# Patient Record
Sex: Female | Born: 1945 | Race: White | Hispanic: No | Marital: Married | Smoking: Never smoker
Health system: Southern US, Community
[De-identification: ages and names within clinical notes are randomized; demographics above are authoritative.]

## PROBLEM LIST (undated history)

## (undated) DIAGNOSIS — E785 Hyperlipidemia, unspecified: Secondary | ICD-10-CM

## (undated) DIAGNOSIS — S065X9A Traumatic subdural hemorrhage with loss of consciousness of unspecified duration, initial encounter: Secondary | ICD-10-CM

## (undated) DIAGNOSIS — I340 Nonrheumatic mitral (valve) insufficiency: Secondary | ICD-10-CM

## (undated) DIAGNOSIS — S065XAA Traumatic subdural hemorrhage with loss of consciousness status unknown, initial encounter: Secondary | ICD-10-CM

## (undated) DIAGNOSIS — T8859XA Other complications of anesthesia, initial encounter: Secondary | ICD-10-CM

## (undated) DIAGNOSIS — I517 Cardiomegaly: Secondary | ICD-10-CM

## (undated) DIAGNOSIS — I4819 Other persistent atrial fibrillation: Secondary | ICD-10-CM

## (undated) DIAGNOSIS — Z7901 Long term (current) use of anticoagulants: Secondary | ICD-10-CM

## (undated) DIAGNOSIS — T4145XA Adverse effect of unspecified anesthetic, initial encounter: Secondary | ICD-10-CM

## (undated) DIAGNOSIS — K219 Gastro-esophageal reflux disease without esophagitis: Secondary | ICD-10-CM

## (undated) DIAGNOSIS — M179 Osteoarthritis of knee, unspecified: Secondary | ICD-10-CM

## (undated) DIAGNOSIS — M1710 Unilateral primary osteoarthritis, unspecified knee: Secondary | ICD-10-CM

## (undated) DIAGNOSIS — I77810 Thoracic aortic ectasia: Secondary | ICD-10-CM

## (undated) DIAGNOSIS — F329 Major depressive disorder, single episode, unspecified: Secondary | ICD-10-CM

## (undated) DIAGNOSIS — G4730 Sleep apnea, unspecified: Secondary | ICD-10-CM

## (undated) DIAGNOSIS — F419 Anxiety disorder, unspecified: Secondary | ICD-10-CM

## (undated) DIAGNOSIS — E669 Obesity, unspecified: Secondary | ICD-10-CM

## (undated) DIAGNOSIS — I10 Essential (primary) hypertension: Secondary | ICD-10-CM

## (undated) DIAGNOSIS — F32A Depression, unspecified: Secondary | ICD-10-CM

## (undated) DIAGNOSIS — I350 Nonrheumatic aortic (valve) stenosis: Secondary | ICD-10-CM

## (undated) HISTORY — PX: KNEE ARTHROSCOPY: SHX127

## (undated) HISTORY — DX: Other persistent atrial fibrillation: I48.19

## (undated) HISTORY — PX: TUBAL LIGATION: SHX77

## (undated) HISTORY — DX: Hyperlipidemia, unspecified: E78.5

## (undated) HISTORY — DX: Long term (current) use of anticoagulants: Z79.01

## (undated) HISTORY — PX: KNEE SURGERY: SHX244

## (undated) HISTORY — DX: Cardiomegaly: I51.7

## (undated) HISTORY — DX: Nonrheumatic mitral (valve) insufficiency: I34.0

## (undated) HISTORY — DX: Thoracic aortic ectasia: I77.810

---

## 1999-11-08 ENCOUNTER — Encounter: Admission: RE | Admit: 1999-11-08 | Discharge: 1999-11-08 | Payer: Self-pay | Admitting: Obstetrics and Gynecology

## 1999-11-08 ENCOUNTER — Encounter: Payer: Self-pay | Admitting: Obstetrics and Gynecology

## 2000-11-11 ENCOUNTER — Encounter: Admission: RE | Admit: 2000-11-11 | Discharge: 2000-11-11 | Payer: Self-pay | Admitting: Obstetrics and Gynecology

## 2000-11-11 ENCOUNTER — Encounter: Payer: Self-pay | Admitting: Obstetrics and Gynecology

## 2002-05-13 ENCOUNTER — Encounter: Payer: Self-pay | Admitting: Obstetrics and Gynecology

## 2002-05-13 ENCOUNTER — Encounter: Admission: RE | Admit: 2002-05-13 | Discharge: 2002-05-13 | Payer: Self-pay | Admitting: Obstetrics and Gynecology

## 2003-06-02 ENCOUNTER — Encounter: Admission: RE | Admit: 2003-06-02 | Discharge: 2003-06-02 | Payer: Self-pay | Admitting: Obstetrics and Gynecology

## 2004-03-09 ENCOUNTER — Ambulatory Visit (HOSPITAL_COMMUNITY): Admission: RE | Admit: 2004-03-09 | Discharge: 2004-03-09 | Payer: Self-pay | Admitting: Cardiology

## 2004-04-09 HISTORY — PX: CARDIAC VALVE REPLACEMENT: SHX585

## 2004-04-09 HISTORY — PX: BURR HOLE FOR SUBDURAL HEMATOMA: SHX1275

## 2004-05-12 ENCOUNTER — Encounter: Admission: RE | Admit: 2004-05-12 | Discharge: 2004-05-12 | Payer: Self-pay | Admitting: Cardiothoracic Surgery

## 2004-05-25 ENCOUNTER — Inpatient Hospital Stay (HOSPITAL_COMMUNITY): Admission: RE | Admit: 2004-05-25 | Discharge: 2004-06-26 | Payer: Self-pay | Admitting: Cardiothoracic Surgery

## 2004-05-25 ENCOUNTER — Encounter (INDEPENDENT_AMBULATORY_CARE_PROVIDER_SITE_OTHER): Payer: Self-pay | Admitting: *Deleted

## 2004-05-25 ENCOUNTER — Ambulatory Visit: Payer: Self-pay | Admitting: Internal Medicine

## 2004-05-26 ENCOUNTER — Encounter (INDEPENDENT_AMBULATORY_CARE_PROVIDER_SITE_OTHER): Payer: Self-pay | Admitting: Cardiology

## 2004-06-07 ENCOUNTER — Ambulatory Visit: Payer: Self-pay | Admitting: Pulmonary Disease

## 2004-07-28 ENCOUNTER — Encounter: Admission: RE | Admit: 2004-07-28 | Discharge: 2004-07-28 | Payer: Self-pay | Admitting: Obstetrics and Gynecology

## 2004-08-10 ENCOUNTER — Inpatient Hospital Stay (HOSPITAL_COMMUNITY): Admission: EM | Admit: 2004-08-10 | Discharge: 2004-08-31 | Payer: Self-pay | Admitting: Emergency Medicine

## 2004-09-11 ENCOUNTER — Encounter: Admission: RE | Admit: 2004-09-11 | Discharge: 2004-09-11 | Payer: Self-pay | Admitting: Neurological Surgery

## 2004-09-18 ENCOUNTER — Emergency Department (HOSPITAL_COMMUNITY): Admission: EM | Admit: 2004-09-18 | Discharge: 2004-09-18 | Payer: Self-pay | Admitting: Emergency Medicine

## 2004-09-28 ENCOUNTER — Encounter: Admission: RE | Admit: 2004-09-28 | Discharge: 2004-09-28 | Payer: Self-pay | Admitting: Neurological Surgery

## 2004-10-09 ENCOUNTER — Encounter (HOSPITAL_COMMUNITY): Admission: RE | Admit: 2004-10-09 | Discharge: 2005-01-07 | Payer: Self-pay | Admitting: Cardiology

## 2005-01-08 ENCOUNTER — Encounter (HOSPITAL_COMMUNITY): Admission: RE | Admit: 2005-01-08 | Discharge: 2005-03-08 | Payer: Self-pay | Admitting: Cardiology

## 2005-10-19 ENCOUNTER — Encounter: Admission: RE | Admit: 2005-10-19 | Discharge: 2005-10-19 | Payer: Self-pay | Admitting: Obstetrics and Gynecology

## 2005-12-06 ENCOUNTER — Encounter: Admission: RE | Admit: 2005-12-06 | Discharge: 2005-12-06 | Payer: Self-pay | Admitting: Gastroenterology

## 2006-11-26 ENCOUNTER — Encounter: Admission: RE | Admit: 2006-11-26 | Discharge: 2006-11-26 | Payer: Self-pay | Admitting: Obstetrics and Gynecology

## 2006-12-26 ENCOUNTER — Ambulatory Visit (HOSPITAL_COMMUNITY): Admission: RE | Admit: 2006-12-26 | Discharge: 2006-12-26 | Payer: Self-pay | Admitting: Neurological Surgery

## 2006-12-26 ENCOUNTER — Ambulatory Visit: Payer: Self-pay | Admitting: Surgery

## 2006-12-26 ENCOUNTER — Encounter (INDEPENDENT_AMBULATORY_CARE_PROVIDER_SITE_OTHER): Payer: Self-pay | Admitting: Neurological Surgery

## 2007-11-20 ENCOUNTER — Emergency Department (HOSPITAL_COMMUNITY): Admission: EM | Admit: 2007-11-20 | Discharge: 2007-11-20 | Payer: Self-pay | Admitting: Emergency Medicine

## 2008-02-23 ENCOUNTER — Encounter: Admission: RE | Admit: 2008-02-23 | Discharge: 2008-02-23 | Payer: Self-pay | Admitting: Obstetrics and Gynecology

## 2009-03-22 ENCOUNTER — Encounter: Admission: RE | Admit: 2009-03-22 | Discharge: 2009-03-22 | Payer: Self-pay | Admitting: Obstetrics and Gynecology

## 2010-04-30 ENCOUNTER — Encounter: Payer: Self-pay | Admitting: Obstetrics and Gynecology

## 2010-05-23 ENCOUNTER — Other Ambulatory Visit: Payer: Self-pay | Admitting: Family Medicine

## 2010-05-23 DIAGNOSIS — Z1231 Encounter for screening mammogram for malignant neoplasm of breast: Secondary | ICD-10-CM

## 2010-06-01 ENCOUNTER — Ambulatory Visit
Admission: RE | Admit: 2010-06-01 | Discharge: 2010-06-01 | Disposition: A | Discharge status: Home / Self Care | Payer: Private Health Insurance - Indemnity | Source: Ambulatory Visit | Attending: Family Medicine | Admitting: Family Medicine

## 2010-06-01 DIAGNOSIS — Z1231 Encounter for screening mammogram for malignant neoplasm of breast: Secondary | ICD-10-CM

## 2010-08-25 NOTE — Op Note (Signed)
NAMEBRILEE, PORT NO.:  [id number]   MEDICAL RECORD NO.:  [id number]          PATIENT TYPE:  INP   LOCATION:  2316                         FACILITY:  [facility]   PHYSICIAN:  Kathlee Nations Trigt III, M.D.DATE OF BIRTH:  [birth year]   DATE OF PROCEDURE:  05/25/2004  DATE OF DISCHARGE:                                 OPERATIVE REPORT   OPERATION:  Aortic valve replacement (aortic valve replacement with 21 mm  St. Jude Reagent valve).   PREOPERATIVE DIAGNOSIS:  Severe aortic stenosis with class III congestive  heart failure.   POSTOPERATIVE DIAGNOSIS:  Severe aortic stenosis with class III congestive  heart failure.   SURGEON:  Kerin Perna, M.D.   ASSISTANT:  Evelene Croon, M.D.   ANESTHESIA:  General by Dr. Adonis Huguenin.   INDICATIONS:  The patient is a 65 year old female with bicuspid aortic valve  and progressive aortic stenosis on serial left 2-D echo exams.  A  significant aortic stenosis of valve area of 0.8 was documented by left and  right heart catheterization by Dr. Armanda Magic.  This also demonstrated  preserved systolic LV function and normal coronary arteries.  She was felt  to be a candidate for aortic valve replacement.  I examined the patient in  the office and reviewed the results of her catheterization and 2-D echoes  was with the patient and her family.  I discussed the indications and  expected benefits of aortic valve replacement for treatment of her aortic  valvular stenosis.  I reviewed the alternatives to surgical therapy as well  and the expected outcome of those alternative therapies.  I discussed with  the patient the plan to use a mechanical aortic prosthesis per her desire to  avoid a redo valve replacement in the future.  She understood that a  mechanical valve prosthesis would require a lifelong commitment Coumadin  anticoagulation, which she accepted.  I also reviewed with the patient the  major aspects of the planned procedure,  including the location of the  surgical incision, use of general anesthesia and cardiopulmonary bypass, and  the expected postoperative hospital recovery period.  I discussed with the  patient the risks to her of aortic valve replacement, including the risks of  MI, CVA, bleeding, infection, and death.  She understood these implications  for the surgery and agreed to proceed with the operation as planned under  what I felt was an informed consent after meeting with her on two separate  occasions in the office.   OPERATIVE FINDINGS:  The patient had bicuspid aortic valve disease.  There  was a significant thickening fibrosis and calcification of the aortic valve.  The TEE post bypass showed the prosthetic valve be functioning well, without  insufficiency and with good LV function.   PROCEDURE:  The patient was brought to operating room and supine on the  operating table, where general anesthesia was induced under invasive  hemodynamic monitoring.  A transesophageal 2-D echo probe was placed by the  anesthesiologist, and the echo exam confirmed the preoperative diagnosis of  severe aortic stenosis.  The chest, abdomen and legs were  prepped with  Betadine and draped as a sterile field.  A sternal incision was made.  The  pericardium was opened and suspended.  A sternal retractor was placed.  Heparin was administered.  Pursestrings were placed in the ascending aorta  and right atrium and the patient was cannulated and placed on bypass after  the ACT was documented as being therapeutic.  Cardioplegia catheters were  placed for both antegrade aortic and retrograde coronary sinus cardioplegia.  An LV vent was placed via the right superior pulmonary vein.  The patient  was cooled to 30 degrees.  The aortic crossclamp was applied and 800 mL of  cold blood cardioplegia was delivered in split doses between the antegrade  aortic and retrograde coronary sinus catheters.  There was good cardioplegic   arrest and septal temperature dropped to less than 10 degrees.  Topical iced  saline was used to augment myocardial preservation and a pericardial  insulator pad was used protect the left phrenic nerve.   A transverse aortotomy was made.  The edge of the aortic tissue was  retracted with a 3-0 silk suture.  The aortic valve inspected and found to be bicuspid, heavily thickened,  calcified and stenotic.  The valve was excised.  The annulus was debrided of  all calcium.  The LV outflow tract was irrigated with copious amounts of  cold saline.  The annulus was sized to a 21 mm St. Jude Reagent valve.  Subannular 2-0 pledgeted Ethibond sutures were placed around the annulus,  numbering 17 mattress sutures in total.  These were then placed through the  sewing ring of the 21 mm Reagent valve, which was then seated, and sutures  were tied.  The valve was opened and closed and found to be well-positioned  without impingement.  There was no obstruction of the coronary ostia and no  evidence of space between each suture.  The outflow tract was again  irrigated and the aortotomy was then closed as the patient was being  rewarmed.  A 4-0 Prolene was used to close the aortotomy in two layers, of  the first layer a running horizontal mattress and the second layer a running  over-and-over suture.  Air was vented from the coronaries and left side of  the heart prior to tying off the aortotomy with a dose of retrograde warm  blood cardioplegia and the usual de-airing maneuvers.  After the air was  purged from the left side of the heart, the aortotomy was closed, the suture  tied, and the crossclamp was removed.   The heart was cardioverted back to a regular rhythm.  The retrograde  cardioplegia catheter was removed.  The LV vent was removed.  The aortic  vent remained to a low level of suction.  Temporary pacing wires were applied.  The patient was rewarmed to 36.5.  The lungs were re-expanded and  the  ventilator was resumed.  The heart was then filled with volume and the  patient separated from bypass without difficulty.  She was in a sinus  rhythm.  The TEE showed the valve to be functioning well without evidence of  intracardiac air.  The cannulas were removed and the protamine was  administered without adverse reaction.  The heart continued to function in a  stable manner.  Hemostasis was documented at the aortotomy and the  cannulation sites.  The mediastinum was irrigated warm saline.  The superior pericardial fat was closed over the aorta.  Two mediastinal  chest  tubes were placed and brought through separate incisions.  The sternum  was closed with interrupted steel wire.  The pectoralis fascia was closed  with a running #1 Vicryl.  The subcutaneous layer and skin were closed using  a running Vicryl and sterile dressings were applied.  Total bypass time was  110 minutes with crossclamp time of 70 minutes.      PV/MEDQ  D:  05/25/2004  T:  05/25/2004  Job:  [id number]   cc:   Armanda Magic, M.D.  [address], [address]  [place], [place] [address]  Fax: [phone]

## 2010-08-25 NOTE — Discharge Summary (Signed)
NAMESTALEY, BUDZINSKI NO.:  [id number]   MEDICAL RECORD NO.:  [id number]          PATIENT TYPE:  INP   LOCATION:  3002                         FACILITY:  [facility]   PHYSICIAN:  Tia Alert, MD     DATE OF BIRTH:  Nov 15, 1945   DATE OF ADMISSION:  08/10/2004  DATE OF DISCHARGE:  08/31/2004                                 DISCHARGE SUMMARY   ADMITTING DIAGNOSIS:  Left acute subdural hematoma.   PROCEDURE:  Twist drill craniostomy for evacuation of left subdural  hematoma.   BRIEF HISTORY OF PRESENT ILLNESS:  [name] is a 65 year old white  female who was on Coumadin for a St. Jude aortic valve who presented with  acute onset of left-sided headache. She had a CT scan in the emergency  department which showed a small left acute subdural hematoma, not requiring  surgical evacuation. She was admitted to the neurosurgical ICU where she was  watched with serial CT scans and frequent neurologic examinations. She had  no right sided weakness. She did have one episode of decreased level of  consciousness in which it was felt she may have had a seizure. She was  started on Dilantin and had no more episodes suggestive of seizure activity.  She continued to do well, and she showed resolution of her acute subdural  hematoma on serial CT scans. She had a MRI and a MRA done to rule out  aneurysm as the cause of her left acute subdural hematoma, and this showed  no evidence of aneurysm. Again, she had a nonfocal neurologic exam. Dr. Donata Clay and Dr. Mayford Knife from cardiovascular surgery and cardiology respectively  followed the patient along. This was quite a difficult situation given her  need for anticoagulation for her mechanical valve, and yet she had a  complication from that anticoagulation. They recommended starting back  anticoagulation to achieve an INR of 1.5, so we would be much less  aggressive with the anticoagulation. Restarted on aspirin on Aug 14, 2004  with  plans to start her back on low-dose heparin several days later. She was  started back on heparin on Aug 16, 2004 to keep her PTT 40 to 60. Follow-up  CT scans showed no evidence of further bleeding. She was started on Coumadin  2.5 mg per day on Aug 17, 2004 with goal INR of 1.5. She was transferred to  the floor and was doing quite well on Aug 19, 2004. Her INR remained 1.0.  Follow-up head CT then showed development of a left-sided chronic subdural  hematoma which on serial scans showed increased growth. Therefore, we  stopped her heparin and held her Coumadin. We checked her PT/INR; her INR  was 1.0. She complained of worsening headaches on Aug 22, 2004 and Aug 23, 2004. When the CT scan of May 17 showed increasing size of the left chronic  subdural hematoma, again I recommended stopping the Coumadin, and I  recommended surgical evacuation. Therefore, we took her to the operating  room on Aug 24, 2004 and performed a left twist drill craniostomy with  placement of left subdural  drain. She did quite well with this and was taken  to the ICU. For details of the operative procedure, please see the dictated  operative note. The subdural drain remained in place for two days and had  minimal output. Follow-up scans showed improvement in the size of the  chronic subdural hematoma. She had a nonfocal neurological exam. Her diet  was advanced to regular diet. Her headache was much improved. In fact, she  had no more headache the remainder of the hospital stay. She was then  transferred to the floor. Once again on Aug 28, 2004, we restarted Coumadin  at 2.5 mg per day, again with a goal INR of 1.5 per cardiovascular  recommendations. Followup head CT showed decreasing size of the residual  extra-axial fluid, and she was discharged to home on Aug 31, 2004 in stable  condition.   DISCHARGE MEDICATIONS:  1.  Coumadin 2.5 mg p.o. q.d. Dr. Mayford Knife to follow INR.  2.  Dilantin 300 mg p.o. q.h.s. We  will continue this for approximately 3      months.  3.  Percocet 1 to 2 p.o. q.8h. p.r.n. pain.  4.  She was to continue her Norvasc, Avapro, Zoloft as she had been on at      home.   She was asked to call for any unusual redness, tenderness, or swelling of  wound or any temperature over 101.5. She is to call for any increasing  headache or numbness, tingling, weakness or seizure activity. Return  appointment is in about 10 days with me with a followup head CT.      DSJ/MEDQ  D:  08/31/2004  T:  08/31/2004  Job:  [id number]

## 2010-08-25 NOTE — Op Note (Signed)
[name], [name] NO.:  [id number]   MEDICAL RECORD NO.:  [id number]          PATIENT TYPE:  INP   LOCATION:  3107                         FACILITY:  [facility]   PHYSICIAN:  Tia Alert, MD     DATE OF BIRTH:  [birth year]   DATE OF PROCEDURE:  08/24/2004  DATE OF DISCHARGE:                                 OPERATIVE REPORT   PREOPERATIVE DIAGNOSIS:  Left chronic subdural hematoma.   POSTOPERATIVE DIAGNOSIS:  Left chronic subdural hematoma.   OPERATION PERFORMED:  Left twist drill craniotomy with placement of left  subdural drain for evacuation of left chronic subdural hematomas.   SURGEON:  Tia Alert, MD   ANESTHESIA:  Local and MAC.   COMPLICATIONS:  None apparent.   INDICATIONS FOR PROCEDURE:  Ms. Bale is a 65 year old white female who  is on Coumadin for a St. Jude aortic valve who presented with a left acute  subdural hematoma not requiring surgical evacuation.  She was doing quite  well in the hospital and had what looked like resolution of her acute  subdural hematoma on serial CT scan.  She was started on a heparin drip to  help avoid cerebrovascular accident  and clotting of her aortic valve and  then started back on Coumadin.  She then started to develop significant  headaches and a follow-up CT scan showed a left chronic subdural hematoma.  Her Coumadin was stopped.  Her INR was normal.  Her heparin was stopped six  hours before surgery and she was consented for left twist drill craniotomy.  Choices included left twist drill craniotomy with subdural drain placement  without general anesthesia versus bur holes or formal craniotomy for  evacuation of the lesion.  We felt because it was a new subdural hematoma  and hadn't had time to form significant membranes and because of her good  neurologic status and her previous problems with general anesthesia, she was  best treated with a more minimal twist drill craniotomy and subdural drain  placement.  Therefore consent was obtained and the patient wished to  proceed.   DESCRIPTION OF PROCEDURE:  The patient was taken to the operating room.  She  was placed in supine position with her head elevated and turned to the  right.  She had a small area that was shaved, prepped with DuraPrep and then  draped in the usual sterile fashion in the left frontal temporal region.  6  cc of local anesthesia were injected and an incision was made in this area  and a self-retaining retractor was placed.  Then using the air-powered  drill, a small bur hole was created.  The dura was then opened with a 15  blade scalpel and the ends of this were coagulated with bipolar cautery with  good flow of significant chronic subdural hematoma fluid (motor oil  appearing).  This seemed to  be under some pressure.  This was irrigated  with a small irrigator and then a subdural drain was placed without the use  of a stylette and this showed good flow of subdural fluid through it. We  then lined the dura with Gelfoam  and then closed the galea with interrupted  2-0 Vicryl after meticulous hemostasis was  achieved.  I then closed the skin with a running 4-0 Ethilon suture.  Sterile dressing was applied.  The subdural drain was hooked to its distal  reservoir.  The patient was then transferred to the recovery room in stable  condition.  At the end of the procedure, all sponge, needle and instrument  counts were correct.       ___________________________________________  Tia Alert, MD    DSJ/MEDQ  D:  08/24/2004  T:  08/25/2004  Job:  [id number]

## 2010-08-25 NOTE — Op Note (Signed)
[name], [name]           ACCOUNT NO.:  [id number]   MEDICAL RECORD NO.:  [id number]          PATIENT TYPE:  INP   LOCATION:  2316                         FACILITY:  [facility]   PHYSICIAN:  Quita Skye. Krista Blue, M.D.  DATE OF BIRTH:  12-29-1945   DATE OF PROCEDURE:  05/25/2004  DATE OF DISCHARGE:                                 OPERATIVE REPORT   PROCEDURE:  Transesophageal echocardiogram.   DIAGNOSIS:  Aortic stenosis.   Ms. [name] is a 65 year old white female who presents to the  operating room for aortic valve replacement.  Dr. Kathlee Nations Trigt requested  transesophageal echocardiogram for interoperative management of the patient.  Following routine cardiac induction, the transesophageal probe was covered  with a latex free sheath, lubricated, and inserted into the patient's  esophagus through a tooth guard.  The overall image of the heart showed no  evidence of pericardial effusion.  The right atrium was normal in size.  The  intra-atrial septum was intact without evidence of defect.  There was no  evidence of masses or thrombus in the right atrium.  The tricuspid valve had  a normal appearing structure with trace regurgitation.  The pulmonary artery  catheter was seen crossing the valve into the right ventricle.  The right  ventricle had good contractility, no evidence of masses or defects of the  interventricular septum.  There was no evidence of segmental wall motion  abnormality of the right ventricle.  The pulmonary valve had trace  regurgitation with pulmonary catheter crossing the valve.  The left  ventricle was normal in size.  The pulmonary vein Doppler showed S and V  wave of 52 and 45 with a normal appearing wave form and no reversal of flow.  The mitral valve had a normal appearing structure with no evidence of  prolapse or flail.  The mitral in flow pattern was normal showing an E and A  wave of 94 and 100 with an estimated max gradient of 3.5 mm.  The  left  ventricle was measured which had good contractility throughout which had  significant left ventricular hypertrophy of about 1.7 cm.  There was good  contractility and no evidence of segmental wall motion abnormalities.  The  aortic valve was evaluated which appeared to be bicuspid effusion of the  left and right coronary cusps.  Valve area was 0.6 cm square.  Doppler  evaluation was difficult because the valve was heavily calcified but  demonstrated a max velocity of about 300 cm per second with a valve area  measuring just under 0.9.  This stenosis was felt to be critical.  The  annulus of the valve measured at 1.8 cm with a sinus size of 3.14.  There  did appear to be some post stenotic dilatation of the proximal aorta  distally.  The descending thoracic aorta was approximately 2.4 cm across  without evidence of significant atherosclerotic disease.  The patient  underwent aortic valve replacement successfully with a St. Jude valve.  There was no evidence of rocking motion or perivalvular leak.  There was  also no evidence of significant stenosis  or any regurgitation, at all.  Peak  flows of this valve were significantly less than 2 meters per second.  The  patient tolerated the separation of bypass and did well post bypass.  The  transesophageal probe was carefully removed and the patient was taken to the  SICU intubated in good condition.  The patient tolerated the procedure well.      JDS/MEDQ  D:  05/25/2004  T:  05/25/2004  Job:  [id number]

## 2010-08-25 NOTE — Op Note (Signed)
[name], GUDGEL NO.:  [id number]   MEDICAL RECORD NO.:  [id number]          PATIENT TYPE:  INP   LOCATION:  2316                         FACILITY:  [facility]   PHYSICIAN:  Suzanna Obey, M.D.       DATE OF BIRTH:  [birth year]   DATE OF PROCEDURE:  06/06/2004  DATE OF DISCHARGE:                                 OPERATIVE REPORT   PREOPERATIVE DIAGNOSIS:  Respiratory distress and failure.   POSTOPERATIVE DIAGNOSIS:  Respiratory distress and failure.   PROCEDURE:  Tracheotomy.   ANESTHESIA:  General endotracheal tube anesthesia.   ESTIMATED BLOOD LOSS:  Less than 5 mL.   INDICATIONS FOR PROCEDURE:  This is a 65 year old who has had surgery for  open heart and she has now failed extubated two times. She does have sleep  apnea.  Patient has felt like her hypoventilation syndrome is contributing  to her failure to extubate.  She is now ready for a tracheotomy.  I  discussed this with the husband and he is informed of the risks and benefits  of the procedure including bleeding, infection, scarring of the trachea and  larynx, pneumothorax, scar and risk of the anesthetic.  All questions were  answered and consent was obtained.   DESCRIPTION OF PROCEDURE:  Patient taken to the operating room and placed in  the supine position.  After adequate general endotracheal tube anesthesia,  was prepped and draped in the usual sterile manner.  A midline incision  vertically was made with the electrocautery.  Dissection was carried down to  the strap muscles where the midline of the strap muscles was divided and  immediately encountered the thyroid isthmus.  This was divided with the  electrocautery and the trachea was then easily visualized.  The second and  third tracheal ring was right in the view of this dissection.  The cricoid  could be palpated superiorly.  An inferior based flap was then created with  electrocautery, not going through the cartilage and then a 3-0 chromic  was  placed through this inferior based flap.  The trachea was then opened with a  hemostat and Metzenbaum scissors were used to cut the trachea to create the  flap.  This was sutured up with a chromic.  The endotracheal tube was then  removed and suctioned out of all blood and debris and the #6 Shiley was  placed.  The skin to trachea was measured and it was approximately 3 cm.  A  6 seemed more than adequate in length.  It was placed without any  difficulty.  It was secured with a 3-0 silk and trach ties.  The patient was  then awakened and brought to recovery in stable condition.  Counts correct.      JB/MEDQ  D:  06/06/2004  T:  06/06/2004  Job:  [id number]

## 2010-08-25 NOTE — Cardiovascular Report (Signed)
[name], [name]           ACCOUNT NO.:  [id number]   MEDICAL RECORD NO.:  [id number]          PATIENT TYPE:  OIB   LOCATION:  NA                           FACILITY:  [facility]   PHYSICIAN:  Armanda Magic, M.D.     DATE OF BIRTH:  [birth year]   DATE OF PROCEDURE:  03/09/2004  DATE OF DISCHARGE:                              CARDIAC CATHETERIZATION   REFERRING PHYSICIAN:  Carola J. Gerri Spore, M.D. and S. Kyra Manges, M.D.   PROCEDURE:  1.  Right and left heart catheterization.  2.  Coronary angiography.  3.  Left ventriculography.   OPERATOR:  Armanda Magic, M.D.   INDICATIONS:  Aortic stenosis.   COMPLICATIONS:  None.   IV ACCESS:  Via right femoral artery 6-French sheath and right femoral vein  8-French sheath.   This is a 65 year old white female with a history of aortic stenosis.  Most  recent 2-D echocardiogram showed moderate aortic stenosis with an aortic  valve area of approximately 0.9-1 sq cm and also increased left ventricular  hypertrophy.  She is now starting to develop some shortness of breath and  now presents for cardiac catheterization.   The patient was brought to the cardiac catheterization laboratory in a  fasting, nonsedated state.  Informed consent was obtained.  The patient was  connected to continuous heart rate and pulse oximetry monitoring,  intermittent blood pressure monitoring.  The right groin was prepped and  draped in a sterile fashion.  1% Xylocaine was used for local anesthesia.  Using a modified Seldinger technique a 6-French sheath was placed in the  right femoral artery.  Using a modified Seldinger technique an 8-French  sheath was placed in the right femoral vein.  Under fluoroscopic guidance, a  Swan-Ganz catheter was placed under balloon flotation into the right atrium.  Right atrial pressure and O2 saturations were measured.  The catheter was  then advanced into the right ventricle and right ventricular pressure was  measured.  The  catheter was then advanced into the pulmonary artery and  pulmonary pressure and saturations were measured.  The catheter was then  allowed to fall into the wedge position and pressure was measured.  The  balloon was then deflated and pulled back into the pulmonary artery.  Cardiac output was measured using 10 mL of saline on five separate  injections and the two highest and lowest values were omitted.  The catheter  was then removed.  Under fluoroscopic guidance a JL4 catheter was placed in  the left coronary artery.  Multiple cineangiographic films were taken in a  30 degree RAO, 40 degree LAO views.  This catheter was then exchanged out  over a guidewire for a 6-French JR4 catheter which was unsuccessful in  engaging the right coronary ostium.  The catheter was exchanged out for a no-  toque coronary catheter which again was unsuccessful in cannulating the  right coronary artery.  The catheter was then exchanged out for a 6-French  AL1 catheter which successfully cannulated the right coronary artery and  cineangiographic films were taken in the 40 degree LAO view.  The catheter  was  then exchanged out over a guidewire for a 6-French angled pigtail  catheter which was placed in the vicinity of the aortic valve, but was  unable to cross the valve.  The catheter was exchanged out over a guidewire  with a 6-French JR4 catheter which was placed up into the area of the aortic  valve.  The valve was probed initially using a J-wire and this wire was  exchanged out for a long exchange straight wire.  The valve was again probed  without success.  My colleague, Dr. Amil Amen, stepped in and placed an AL1  catheter up in the proximity of the aortic valve and used a straight  catheter to cross over the aortic valve.  An Amplatz catheter was then  exchanged out over a guidewire for a 6-French angled pigtail catheter which  was placed over the wire into the left ventricular cavity.  Left   ventriculography was performed using a 30 degree RAO view using a total of  30 mL of contrast at 15 mL/second.  The catheter was then pulled back across  the aortic valve and then subsequently the catheter and sheaths were  removed.  Manual compression was performed until adequate hemostasis was  obtained.  The patient was transferred back to room in stable condition.   RESULTS:  Right heart catheterization data:  The right atrial pressure was  12/12 with a mean of 11 mmHg.  RV pressure 38/12 with a mean of 15 mmHg.  PA  pressure 39/23 with a mean of 33 mmHg.  Pulmonary capillary wedge 18/17 mmHg  with a mean of 15 mmHg.  Aortic pressure 113/72 mmHg.  LV pressure 150/12  mmHg.  LVEDP 24 mmHg.  O2 saturations are A 66%, PA 67%, AO 91%.  Cardiac  output by Fick 4.7, by thermodilution 2.2.  Cardiac index by Fick 6.3 and  thermodilution 3.  Aortic valve peak gradient was 30 mmHg, mean gradient 25  mmHg.  Aortic valve area calculated by the Fick is 0.93 sq cm consistent  with moderate aortic stenosis.   Left ventriculography shows low normal LV systolic function, EF 50-55% with  heavily calcified aortic valve.   The left main coronary artery is widely patent.  It bifurcates into left  anterior descending artery and left circumflex artery.  The left anterior  descending artery is widely patent throughout its course to the apex and  gives rise to one diagonal branch which is widely patent.  Left circumflex  is widely patent throughout its course and gives rise to a first obtuse  marginal branch which is widely patent.  It then gives rise to a second  obtuse marginal branch which is widely patent and terminates in a posterior  descending artery which is widely patent.   The right coronary artery is small and nondominant, but patent.   ASSESSMENT:  1.  Normal coronary arteries.  2.  Moderate aortic stenosis in a patient who is symptomatic with shortness     of breath.  3.  Mild pulmonary  hypertension.  4.  Low normal left ventricular systolic function with elevated LVEDP.   PLAN:  Discharge to home after IV fluid and bed rest.  CVTS consult for  aortic valve replacement.      Trac   TT/MEDQ  D:  03/09/2004  T:  03/09/2004  Job:  [id number]   cc:   Mikey Bussing, M.D.  [address]  [place]  [place] [address]

## 2010-08-25 NOTE — Consult Note (Signed)
NAMELARRA, CRUNKLETON NO.:  [id number]   MEDICAL RECORD NO.:  [id number]          PATIENT TYPE:  INP   LOCATION:  2316                         FACILITY:  [facility]   PHYSICIAN:  Shan Levans, M.D. LHCDATE OF BIRTH:  1945/09/18   DATE OF CONSULTATION:  DATE OF DISCHARGE:                                   CONSULTATION   CHIEF COMPLAINT:  Respiratory failure, status post AVR.   HISTORY OF PRESENT ILLNESS:  This is a 65 year old white female who was  admitted on May 25, 2004 for elective aortic valve replacement.  Previously found to have aortic stenosis.  Did not have bypass surgery.  Does have preexisting obesity, depression, beta blocker intolerance, and  hypertension.  She was found to have a cardiac murmur for three years and  was followed by serial echoes and found to have progressive valvular  gradient, and then was referred electively for the aortic valve replacement.  Does have an underlying obesity.   Preop medicines that include Zoloft, HCTZ, Benicar, Norvasc, aspirin,  vitamins, calcium.   Patient is a nonsmoker with no preexisting lung disease other than as it  relates to the obesity.   PAST MEDICAL HISTORY:  Medical as noted above.  Medications as noted above.   ALLERGIES:  Intolerant to CODEINE because of tremor.  Cannot tolerate BETA  BLOCKERS.   SOCIAL HISTORY:  Works for [employer].  Does not smoke.  Married.  Husband will be caretaker following surgery.   FAMILY HISTORY:  Positive for mitral valve prolapse in both parents.  Grandfather had bypass surgery.  Lived to age 46.   REVIEW OF SYSTEMS:  Weight has been in the 220 range and has been stable.  She has not been found to have obstructive sleep apnea in the past, but has  not had specific testing for this.  No active chronic lung disease.  No  smoking is noted.  Review of systems otherwise noncontributory.   PHYSICAL EXAMINATION:  This is an obese white female, recently  reintubated  in no acute distress.  Temperature 101.3, blood pressure 110/55, pulse 125  to 100 range, atrial fibrillation.  PA pressure 35/24. Cardiac index 2.65.  Saturation 100%.  Earlier prior to intubation:  pH 7.21, PCO2 59, PO2 62 on  the 6 liter cannula.  Now on tidal volume of 750, PEEP of 10, FIO2 of 90%,  rate of 16, pH 750, PCO2 29, PO2 115.  And now the PEEP is down to 8 and  FIO2 down to 0.5 and tidal volume down to 700, rate down to 14, assist  control mode.  Peak pressure 34, plateau pressure 28.  CHEST:  Showed expired wheezes with poor air movement, rales at the bases.  CARDIAC:  Resting tachycardia, irregular irregular without S3.  Normal S1,  S2.  Artificial valve closure sound noted.  EXTREMITIES:  No edema or clubbing.  Good perfusion.  Good capillary refill.  ABDOMEN:  Protuberant.  Bowel sounds active.  NEUROLOGIC:  Patient is awake and alert.  Will follow simple commands.  Is  on __________ drip.  Is very calm.   LABORATORY  DATA:  Hemoglobin 9.9, white count 9.7, platelet count 192.  Sodium 140, potassium 3.6, chloride 106, CO2 22, BUN 12, creatinine 1.0,  magnesium 2.1, blood sugar 183, calcium 9.7.   The patient maintains the __________ drip, the low dose dopamine, is on  amiodarone.  Is off neo.  Is on low dose Levophed.  Also on a Lasix drip.  Chest x-ray is obtained and reviewed and shows atelectasis, mild edema is  noted.  No evidence of acute lung injury is yet discerned.  Low lung volumes  is noted.   IMPRESSION:  Aortic stenosis status post aortic valve replacement on  May 25, 2004 with postop respiratory failure with hypoxemia and  hypercarbia, low lung volumes, atelectasis and fever, possible early acute  lung injury.  No preexisting lung disease, but likely has element of obesity  hypoventilation syndrome, obstructive sleep apnea with elevated PCO2 preop.  Has beta blocker intolerances, history of hypertension, now has atrial  fibrillation,  pulmonary edema and shock postop with vasopressor dependence.  Cardiac index is adequate.  PADs are adequate.   RECOMMENDATIONS:  Agree with diuresis as you are doing and vasopressor  support as you are applying.  Keep ventilator on through the weekend until  the lungs are improved and patient is out of shock state.  Will likely need  to extubate to the bilevel support or CPAP as a bridge, then later use CPAP  h.s.  Agree with expanding antibiotics coverage to cefepime and vancomycin.  Will obtain a BAL of right lower lobe.  Agree with blood cultures and urine  cultures.  Will start neb treatments.  Will adjust ventilator accordingly  and also agree with the triple lumen PICC catheter.  Will follow with you.      PW/MEDQ  D:  05/26/2004  T:  05/26/2004  Job:  [id number]   cc:   Mikey Bussing, M.D.  [address]  [place]  [place] [address]   Otilio Connors. Gerri Spore, M.D.  [address]  [place]  [place] [address]  Fax: [phone]   Quita Skye, MD

## 2010-08-25 NOTE — Consult Note (Signed)
[name], MENTZEL NO.:  [id number]   MEDICAL RECORD NO.:  [id number]          PATIENT TYPE:  INP   LOCATION:  3110                         FACILITY:  [facility]   PHYSICIAN:  Meade Maw, M.D.    DATE OF BIRTH:  04-25-1945   DATE OF CONSULTATION:  DATE OF DISCHARGE:                                   CONSULTATION   PRIMARY CARDIOLOGIST:  Armanda Magic, M.D.   INDICATION FOR CONSULTATION:  Recent aortic valve replacement, unable to  anticoagulate secondary to subarachnoid bleed.   HISTORY:  Amyjo is a very pleasant 65 year old female with a past medical  history of aortic stenosis. She underwent aortic valve replacement in  February 2006. Her postoperative course was complicated by upper respiratory  tract infection, pneumonia requiring tracheostomy, congestive heart failure,  and perioperative atrial fibrillation. She was initiated on amiodarone. This  was discontinued secondary to elevation in her liver enzymes. The patient  presented with a three to four week history of ongoing daily headache  without focal deficits. On the day of admission she had eaten popcorn,  started with some paroxysmal coughing, and she went to do errands and while  sitting in the car developed a severe headache associated with nausea,  vomiting, and diaphoresis. She was evaluated in the emergency room. CT scan  of the head revealed a subarachnoid bleed with a 3-4 mm midline shift. There  was no focal deficit. She was admitted by neurosurgery. Her INR in the  clinic was noted to be therapeutic. She had only had one slightly elevated  INR. Her INR at the time of presentation was 2.3. In the emergency room she  was given FFP 4 units and vitamin K 10 mg subcu. Currently her  anticoagulation including aspirin is on hold.   REVIEW OF SYSTEMS:  As noted above. She has had increasing fatigue recently.   FAMILY HISTORY:  Noncontributory.   SOCIAL HISTORY:  No history of tobacco, alcohol, or  illicit drug use.   PAST MEDICAL HISTORY:  As noted above significant for hypertension, aortic  valve replacement with St. Jude valve, respiratory failure requiring  tracheostomy, fatigue secondary to beta blockers, and perioperative atrial  fibrillation.   ALLERGIES:  CODEINE.   HOME MEDICATIONS:  1.  Toprol 25 mg daily.  2.  Coumadin adjusted to maintain an INR of 2 to 3.  3.  Benicar 20 mg daily.  4.  Norvasc 10 mg daily.  5.  Zoloft 50 mg daily.  6.  Ambien p.r.n.   CURRENT MEDICATIONS:  Zofran, Demerol, Toprol XL 25 mg daily, and Percocet  p.r.n.   PHYSICAL EXAMINATION:  GENERAL: A middle-age female in no acute distress,  continues to have mild headache.  HEENT: Unremarkable.  VITAL SIGNS: Blood pressure 105/55, heart rate 72. She is afebrile.  PULMONARY: Breath sounds are equal and clear to auscultation. O2 saturation  on room air is 98%.  CARDIOVASCULAR: Regular rate and rhythm. There is a prosthetic valve click.  There is no aortic insufficiency noted.  ABDOMEN: Obese, nontender. No usual bruits or pulsation noted.  EXTREMITIES: No peripheral edema.  SKIN: Warm  and dry.   LABORATORY DATA:  White count 8.8, hematocrit 35, platelet count 336,000.  Potassium 3.6, creatinine 0.9, INR 1.4.   ECG reveals a sinus rhythm. There is a first degree AV block. She is noted  to have nonspecific ST changes. Her QTC is 547 milliseconds. CT scan of the  head reveals a subdural hematoma with a 3 to 4 mm shift of the midline  structure.   IMPRESSION:  1.  Acute subarachnoid bleed secondary to anticoagulation. The patient is      fortunate and has had no focal deficits noted. Risks versus benefits of      initiating Coumadin will have to be considered. The patient was      therapeutic at the time of presentation. She is also at a slightly      higher risk of thrombosis in view of her history of atrial fibrillation      and hypertension. Fortunately the patient has preserved systolic       function.  2.  Hypertension. Blood pressure is well controlled at this point.  3.  Headache. This has significantly improved.      HP/MEDQ  D:  08/11/2004  T:  08/11/2004  Job:  [id number]   cc:   Tia Alert, MD  Fax: [phone]   Armanda Magic, M.D.  [address]  [place], [place] [address]  Fax: [phone]   Otilio Connors. Gerri Spore, M.D.  [address]  [place]  [place] [address]  Fax: [phone]

## 2010-08-25 NOTE — H&P (Signed)
[name], AUSTILL NO.:  [id number]   MEDICAL RECORD NO.:  [id number]          PATIENT TYPE:  INP   LOCATION:  3110                         FACILITY:  [facility]   PHYSICIAN:  Tia Alert, MD     DATE OF BIRTH:  [birth year]   DATE OF ADMISSION:  08/10/2004  DATE OF DISCHARGE:                                HISTORY & PHYSICAL   CHIEF COMPLAINT:  Left subdural hematoma.   HISTORY OF PRESENT ILLNESS:  [name] is a 65 year old white female  with a history of aortic stenosis status post aortic valve replacement with  a St. Jude valve who is on Coumadin who noted the onset of a sudden severe  headache while seated in her car earlier today, it was sudden onset, with no  loss of consciousness, quite severe, no numbness, tingling, weakness or  seizure activity.  She was brought to the emergency department where a head  CT showed a left acute subdural hematoma, her INR at this time was 2.2 and  she was ordered 4 units of FFP and vitamin K and neurosurgical consultation  was requested.  The patient complains of a headache about 7/10.  She denies  any history of trauma or falls.   PAST MEDICAL HISTORY:  1.  Atrial fibrillation.  2.  Hypertension.  3.  Aortic valve replacement.  4.  Knee surgery.  5.  Elbow surgery.  6.  History of paralysis of hemidiaphragm.   MEDICATIONS:  Toprol XL, Zoloft, multivitamin, calcium and Coumadin.   ALLERGIES:  CODEINE.   PHYSICAL EXAMINATION:  VITAL SIGNS:  Blood pressure 165/84, respirations 22,  she is afebrile.  GENERAL:  Very pleasant and cooperative white female who is in some obvious  discomfort lying on a stretcher.  HEENT:  Normocephalic, atraumatic.  Extraocular movements are intact.  Neck  is supple.  HEART:  Heart has an irregular rhythm.  EXTREMITIES:  No clubbing, cyanosis, or edema.  NEUROLOGIC EXAM:  She is awake and alert.  She is oriented x4.  No aphasia.  Good attention span.  Good fund of knowledge.  Good  memory.  Cranial nerves  II-XII are intact.  She has no pronator drift.  She moves everything  equally.  Good grips.  Gait is not tested.   IMAGING STUDIES:  CT scan of the brain shows a small left acute subdural  hematoma which measures much less than 1 cm with about 4 mm of midline  shift.  I do not see any evidence of subarachnoid hemorrhage though it may  be a little more hyperdense in the left sylvian fissure than in the right  but this is a very soft call.   ASSESSMENT AND PLAN:  This is a 65 year old white female who has got an  acute subdural hematoma with no evidence of trauma.  She was on Coumadin  with an INR of 2.2.  She was given fresh frozen plasma and vitamin K.  We  will repeat her PT/INR after the fresh frozen plasma is in.  We will admit  her to the intensive care unit q.1h. neurologic checks.  Because  she does  have a small subdural and no history of trauma, we will perform an MRI/MRA  to rule out a posterior communicating artery  aneurysm especially given the  history of her headache.   This is a very difficult situation because of her aortic mechanical valve.  She now has a contraindication to anticoagulation.  There are really no good  studies to help [place] determine when to resume her anticoagulation with a small  acute subdural hematoma.  We will  follow her with serial CT scans and we  will just put her on aspirin as soon as we feel it is safe and then convert  her to Lovenox or heparin at a low dose before recoumadinizing her.  The  risk of the subdural increasing in size or becoming a chronic subdural and  increasing this __________ needing surgery at a later date was discussed  with the patient.  She demonstrated understanding.      DSJ/MEDQ  D:  08/10/2004  T:  08/10/2004  Job:  [id number]

## 2010-08-25 NOTE — Discharge Summary (Signed)
[name], [name] NO.:  [id number]   MEDICAL RECORD NO.:  [id number]          PATIENT TYPE:  INP   LOCATION:  2038                         FACILITY:  [facility]   PHYSICIAN:  Kerin Perna, M.D.  DATE OF BIRTH:  07-07-1945   DATE OF ADMISSION:  05/25/2004  DATE OF DISCHARGE:  06/26/2004                                 DISCHARGE SUMMARY   HISTORY OF PRESENT ILLNESS:  The patient is a 65 year old overweight white  female who was evaluated by Dr. Donata Clay in mid December of last year for  an aortic valve replacement for recently diagnosed aortic stenosis.  The  patient has a 2-3 year history of cardiac murmur and has been followed by  serial 2-D echocardiograms by Dr. Mayford Knife.  The echo's have showed a  progressive increase in transvalvular gradient and her most recent echo in  late 2005 showed an aortic valve area of 0.9.  Subsequent to this she  underwent cardiac catheterization with right and left heart catheterization  on March 09, 2005.  The coronaries were normal.  Her ejection fraction was  55%.  Her aortic valve area was 0.9 and her PA pressure was 38/24 with a  peak gradient of 34 mmHg.  Due to these findings and increasing symptoms of  dyspnea on exertion and recently noticed ankle edema, the patient was felt  to be a candidate for valve replacement and was admitted this  hospitalization for the procedure.   PAST MEDICAL HISTORY:  1.  Hypertension.  2.  Aortic stenosis.  3.  Depression.  4.  Beta blocker intolerance.   MEDICATIONS PRIOR TO ADMISSION:  1.  Zoloft 50 mg daily.  2.  Hydrochlorothiazide 25 mg daily.  3.  Benicar 20 mg daily.  4.  Norvasc 10 mg daily.  5.  Aspirin 81 mg daily.  6.  Vitamins and calcium.   ALLERGIES:  SHE HAS AN TOLERANCE TO CODEINE.   FAMILY HISTORY/SOCIAL HISTORY/REVIEW OF SYSTEMS/PHYSICAL EXAMINATION:  Please see the history and physical done on admission.   HOSPITAL COURSE:  The patient was admitted electively  and on May 25, 2004 was taken to the operating room where he underwent the aortic valve  replacement with a 21 mm [employer] St. Jude mechanical valve prosthesis.  The  patient tolerated the procedure well and was taken to the intensive care  unit in stable condition.   POSTOPERATIVE HOSPITAL COURSE:  The patient's postoperative course was  complicated primarily due to respiratory failure.  She was initially  extubated but did not tolerate this with a gradually increasing PCO2 with  retention requiring reintubation.  A critical care medicine consultation was  then obtained.  The working diagnoses over the next several weeks included  combinations of multiple factors including obstructive sleep apnea, possible  infectious processes, fluid overload, pituitary and other endocrine apathies  were ruled out.  Aggressive management was undertaken to try to wean her and  one attempt was made with quick failure.  She eventually came to requiring  placement of tracheostomy.  During the postoperative phase she also had  atrial fibrillation requiring multiple  agent regimens for chemical  cardioversion as well as Coumadin anticoagulation therapy.  She did  subsequently convert to a normal sinus rhythm.  After aggressive management  with the tracheostomy she was able to be plugged and tracheostomy was  discontinued.  Nutrition and other respiratory consultations were obtained  and the patient tolerated a gradual increase in mobility with aggressive  interventions.  She was taught a diet management precaution regimen to help  prevent aspiration.  Incision healed well without difficulty with infection.  She was weaned from oxygen maintaining good saturations on room air.  She  showed no evidence of congestive heart failure and on June 26, 2004 she was  deemed to be acceptable for discharge.   DISCHARGE MEDICATIONS:  1.  Zoloft 50 mg daily.  2.  Coumadin 5 mg daily and as directed by her  cardiologist, Dr. Mayford Knife.  3.  Toprol XL 25 mg daily.  4.  Amiodarone 200 mg daily.  5.  For pain Tylox one or two every four hours as needed for pain.   DISCHARGE INSTRUCTIONS:  The patient received written instructions regarding  medications, activity, diet, wound care and follow up. Follow up included  Dr. Donata Clay Friday, July 14, 2004, Dr. Mayford Knife in two weeks post discharge.  INR's  were also to be managed on the schedule for Dr. Norris Cross office.   CONDITION ON DISCHARGE:  Stable, improved.   FINAL DIAGNOSIS:  Aortic stenosis status post mechanical aortic valve  replacement.   OTHER DIAGNOSES:  1.  Postoperative atrial fibrillation.  2.  Postoperative ventilatory dependent respiratory failure, multifactorial.  3.  Congestive heart failure.  4.  Hypertension.  5.  Obesity.  6.  Depression.  7.  Diabetes mellitus type 2.  8.  Postoperative anemia.       WEG/MEDQ  D:  10/25/2004  T:  10/26/2004  Job:  [id number]   cc:   Armanda Magic, M.D.  [address]  [place], [place] [address]  Fax: [phone]   [facility]

## 2010-09-06 ENCOUNTER — Ambulatory Visit
Admission: RE | Admit: 2010-09-06 | Discharge: 2010-09-06 | Disposition: A | Discharge status: Home / Self Care | Payer: Medicare Other | Source: Ambulatory Visit | Attending: Family Medicine | Admitting: Family Medicine

## 2010-09-06 ENCOUNTER — Other Ambulatory Visit: Payer: Self-pay | Admitting: Family Medicine

## 2010-09-06 DIAGNOSIS — R52 Pain, unspecified: Secondary | ICD-10-CM

## 2011-04-12 DIAGNOSIS — Z7901 Long term (current) use of anticoagulants: Secondary | ICD-10-CM | POA: Diagnosis not present

## 2011-04-12 DIAGNOSIS — Z954 Presence of other heart-valve replacement: Secondary | ICD-10-CM | POA: Diagnosis not present

## 2011-04-24 ENCOUNTER — Other Ambulatory Visit: Payer: Self-pay | Admitting: Family Medicine

## 2011-04-24 DIAGNOSIS — Z1231 Encounter for screening mammogram for malignant neoplasm of breast: Secondary | ICD-10-CM

## 2011-05-10 DIAGNOSIS — I4891 Unspecified atrial fibrillation: Secondary | ICD-10-CM | POA: Diagnosis not present

## 2011-05-10 DIAGNOSIS — Z954 Presence of other heart-valve replacement: Secondary | ICD-10-CM | POA: Diagnosis not present

## 2011-05-14 ENCOUNTER — Other Ambulatory Visit: Payer: Self-pay | Admitting: Gastroenterology

## 2011-05-14 DIAGNOSIS — Z1211 Encounter for screening for malignant neoplasm of colon: Secondary | ICD-10-CM

## 2011-05-22 ENCOUNTER — Ambulatory Visit
Admission: RE | Admit: 2011-05-22 | Discharge: 2011-05-22 | Disposition: A | Discharge status: Home / Self Care | Payer: Medicare Other | Source: Ambulatory Visit | Attending: Gastroenterology | Admitting: Gastroenterology

## 2011-05-22 DIAGNOSIS — K5730 Diverticulosis of large intestine without perforation or abscess without bleeding: Secondary | ICD-10-CM | POA: Diagnosis not present

## 2011-05-22 DIAGNOSIS — Z1211 Encounter for screening for malignant neoplasm of colon: Secondary | ICD-10-CM

## 2011-06-05 ENCOUNTER — Ambulatory Visit
Admission: RE | Admit: 2011-06-05 | Discharge: 2011-06-05 | Disposition: A | Discharge status: Home / Self Care | Payer: Medicare Other | Source: Ambulatory Visit | Attending: Family Medicine | Admitting: Family Medicine

## 2011-06-05 DIAGNOSIS — Z1231 Encounter for screening mammogram for malignant neoplasm of breast: Secondary | ICD-10-CM | POA: Diagnosis not present

## 2011-06-07 DIAGNOSIS — Z954 Presence of other heart-valve replacement: Secondary | ICD-10-CM | POA: Diagnosis not present

## 2011-06-07 DIAGNOSIS — Z7901 Long term (current) use of anticoagulants: Secondary | ICD-10-CM | POA: Diagnosis not present

## 2011-06-08 ENCOUNTER — Other Ambulatory Visit: Payer: Self-pay | Admitting: Dermatology

## 2011-06-08 DIAGNOSIS — C44519 Basal cell carcinoma of skin of other part of trunk: Secondary | ICD-10-CM | POA: Diagnosis not present

## 2011-06-08 DIAGNOSIS — L820 Inflamed seborrheic keratosis: Secondary | ICD-10-CM | POA: Diagnosis not present

## 2011-06-08 DIAGNOSIS — D239 Other benign neoplasm of skin, unspecified: Secondary | ICD-10-CM | POA: Diagnosis not present

## 2011-06-14 DIAGNOSIS — Z954 Presence of other heart-valve replacement: Secondary | ICD-10-CM | POA: Diagnosis not present

## 2011-06-14 DIAGNOSIS — Z7901 Long term (current) use of anticoagulants: Secondary | ICD-10-CM | POA: Diagnosis not present

## 2011-06-27 DIAGNOSIS — Z7901 Long term (current) use of anticoagulants: Secondary | ICD-10-CM | POA: Diagnosis not present

## 2011-06-27 DIAGNOSIS — Z954 Presence of other heart-valve replacement: Secondary | ICD-10-CM | POA: Diagnosis not present

## 2011-07-18 DIAGNOSIS — I4891 Unspecified atrial fibrillation: Secondary | ICD-10-CM | POA: Diagnosis not present

## 2011-07-18 DIAGNOSIS — Z7901 Long term (current) use of anticoagulants: Secondary | ICD-10-CM | POA: Diagnosis not present

## 2011-08-01 ENCOUNTER — Other Ambulatory Visit: Payer: Self-pay | Admitting: Dermatology

## 2011-08-01 DIAGNOSIS — C44519 Basal cell carcinoma of skin of other part of trunk: Secondary | ICD-10-CM | POA: Diagnosis not present

## 2011-08-15 DIAGNOSIS — Z954 Presence of other heart-valve replacement: Secondary | ICD-10-CM | POA: Diagnosis not present

## 2011-08-15 DIAGNOSIS — I4891 Unspecified atrial fibrillation: Secondary | ICD-10-CM | POA: Diagnosis not present

## 2011-08-31 DIAGNOSIS — I359 Nonrheumatic aortic valve disorder, unspecified: Secondary | ICD-10-CM | POA: Diagnosis not present

## 2011-08-31 DIAGNOSIS — I4891 Unspecified atrial fibrillation: Secondary | ICD-10-CM | POA: Diagnosis not present

## 2011-08-31 DIAGNOSIS — Z7901 Long term (current) use of anticoagulants: Secondary | ICD-10-CM | POA: Diagnosis not present

## 2011-08-31 DIAGNOSIS — I119 Hypertensive heart disease without heart failure: Secondary | ICD-10-CM | POA: Diagnosis not present

## 2011-08-31 DIAGNOSIS — Z954 Presence of other heart-valve replacement: Secondary | ICD-10-CM | POA: Diagnosis not present

## 2011-08-31 DIAGNOSIS — Z79899 Other long term (current) drug therapy: Secondary | ICD-10-CM | POA: Diagnosis not present

## 2011-09-11 DIAGNOSIS — I4891 Unspecified atrial fibrillation: Secondary | ICD-10-CM | POA: Diagnosis not present

## 2011-09-11 DIAGNOSIS — Z79899 Other long term (current) drug therapy: Secondary | ICD-10-CM | POA: Diagnosis not present

## 2011-09-11 DIAGNOSIS — F329 Major depressive disorder, single episode, unspecified: Secondary | ICD-10-CM | POA: Diagnosis not present

## 2011-09-11 DIAGNOSIS — E7800 Pure hypercholesterolemia, unspecified: Secondary | ICD-10-CM | POA: Diagnosis not present

## 2011-09-11 DIAGNOSIS — I10 Essential (primary) hypertension: Secondary | ICD-10-CM | POA: Diagnosis not present

## 2011-09-11 DIAGNOSIS — Z7901 Long term (current) use of anticoagulants: Secondary | ICD-10-CM | POA: Diagnosis not present

## 2011-09-11 DIAGNOSIS — Z0000 Encounter for general adult medical examination without abnormal findings: Secondary | ICD-10-CM | POA: Diagnosis not present

## 2011-09-11 DIAGNOSIS — Z954 Presence of other heart-valve replacement: Secondary | ICD-10-CM | POA: Diagnosis not present

## 2011-09-28 DIAGNOSIS — Z954 Presence of other heart-valve replacement: Secondary | ICD-10-CM | POA: Diagnosis not present

## 2011-09-28 DIAGNOSIS — Z7901 Long term (current) use of anticoagulants: Secondary | ICD-10-CM | POA: Diagnosis not present

## 2011-10-25 DIAGNOSIS — Z7901 Long term (current) use of anticoagulants: Secondary | ICD-10-CM | POA: Diagnosis not present

## 2011-10-25 DIAGNOSIS — I4891 Unspecified atrial fibrillation: Secondary | ICD-10-CM | POA: Diagnosis not present

## 2011-11-22 DIAGNOSIS — Z954 Presence of other heart-valve replacement: Secondary | ICD-10-CM | POA: Diagnosis not present

## 2011-11-22 DIAGNOSIS — Z7901 Long term (current) use of anticoagulants: Secondary | ICD-10-CM | POA: Diagnosis not present

## 2011-12-20 DIAGNOSIS — Z954 Presence of other heart-valve replacement: Secondary | ICD-10-CM | POA: Diagnosis not present

## 2011-12-20 DIAGNOSIS — Z7901 Long term (current) use of anticoagulants: Secondary | ICD-10-CM | POA: Diagnosis not present

## 2012-01-17 DIAGNOSIS — H2510 Age-related nuclear cataract, unspecified eye: Secondary | ICD-10-CM | POA: Diagnosis not present

## 2012-01-18 DIAGNOSIS — Z7901 Long term (current) use of anticoagulants: Secondary | ICD-10-CM | POA: Diagnosis not present

## 2012-01-18 DIAGNOSIS — Z954 Presence of other heart-valve replacement: Secondary | ICD-10-CM | POA: Diagnosis not present

## 2012-02-21 DIAGNOSIS — Z23 Encounter for immunization: Secondary | ICD-10-CM | POA: Diagnosis not present

## 2012-02-29 DIAGNOSIS — Z954 Presence of other heart-valve replacement: Secondary | ICD-10-CM | POA: Diagnosis not present

## 2012-02-29 DIAGNOSIS — I119 Hypertensive heart disease without heart failure: Secondary | ICD-10-CM | POA: Diagnosis not present

## 2012-02-29 DIAGNOSIS — I4891 Unspecified atrial fibrillation: Secondary | ICD-10-CM | POA: Diagnosis not present

## 2012-02-29 DIAGNOSIS — I359 Nonrheumatic aortic valve disorder, unspecified: Secondary | ICD-10-CM | POA: Diagnosis not present

## 2012-02-29 DIAGNOSIS — Z7901 Long term (current) use of anticoagulants: Secondary | ICD-10-CM | POA: Diagnosis not present

## 2012-03-27 DIAGNOSIS — Z7901 Long term (current) use of anticoagulants: Secondary | ICD-10-CM | POA: Diagnosis not present

## 2012-03-27 DIAGNOSIS — Z954 Presence of other heart-valve replacement: Secondary | ICD-10-CM | POA: Diagnosis not present

## 2012-04-15 DIAGNOSIS — Z7901 Long term (current) use of anticoagulants: Secondary | ICD-10-CM | POA: Diagnosis not present

## 2012-04-15 DIAGNOSIS — I4891 Unspecified atrial fibrillation: Secondary | ICD-10-CM | POA: Diagnosis not present

## 2012-04-15 DIAGNOSIS — Z954 Presence of other heart-valve replacement: Secondary | ICD-10-CM | POA: Diagnosis not present

## 2012-04-15 DIAGNOSIS — R4182 Altered mental status, unspecified: Secondary | ICD-10-CM | POA: Diagnosis not present

## 2012-04-15 DIAGNOSIS — I359 Nonrheumatic aortic valve disorder, unspecified: Secondary | ICD-10-CM | POA: Diagnosis not present

## 2012-04-15 DIAGNOSIS — I119 Hypertensive heart disease without heart failure: Secondary | ICD-10-CM | POA: Diagnosis not present

## 2012-04-24 DIAGNOSIS — R55 Syncope and collapse: Secondary | ICD-10-CM | POA: Diagnosis not present

## 2012-05-07 DIAGNOSIS — Z954 Presence of other heart-valve replacement: Secondary | ICD-10-CM | POA: Diagnosis not present

## 2012-05-07 DIAGNOSIS — Z7901 Long term (current) use of anticoagulants: Secondary | ICD-10-CM | POA: Diagnosis not present

## 2012-05-12 ENCOUNTER — Other Ambulatory Visit: Payer: Self-pay | Admitting: Family Medicine

## 2012-05-12 DIAGNOSIS — Z1231 Encounter for screening mammogram for malignant neoplasm of breast: Secondary | ICD-10-CM

## 2012-05-19 DIAGNOSIS — Z8669 Personal history of other diseases of the nervous system and sense organs: Secondary | ICD-10-CM | POA: Diagnosis not present

## 2012-05-19 DIAGNOSIS — R55 Syncope and collapse: Secondary | ICD-10-CM | POA: Diagnosis not present

## 2012-05-27 DIAGNOSIS — L819 Disorder of pigmentation, unspecified: Secondary | ICD-10-CM | POA: Diagnosis not present

## 2012-05-27 DIAGNOSIS — D1801 Hemangioma of skin and subcutaneous tissue: Secondary | ICD-10-CM | POA: Diagnosis not present

## 2012-05-27 DIAGNOSIS — L821 Other seborrheic keratosis: Secondary | ICD-10-CM | POA: Diagnosis not present

## 2012-05-27 DIAGNOSIS — D692 Other nonthrombocytopenic purpura: Secondary | ICD-10-CM | POA: Diagnosis not present

## 2012-05-27 DIAGNOSIS — Z85828 Personal history of other malignant neoplasm of skin: Secondary | ICD-10-CM | POA: Diagnosis not present

## 2012-05-27 DIAGNOSIS — L570 Actinic keratosis: Secondary | ICD-10-CM | POA: Diagnosis not present

## 2012-05-27 DIAGNOSIS — L820 Inflamed seborrheic keratosis: Secondary | ICD-10-CM | POA: Diagnosis not present

## 2012-05-29 DIAGNOSIS — Z8669 Personal history of other diseases of the nervous system and sense organs: Secondary | ICD-10-CM | POA: Diagnosis not present

## 2012-05-29 DIAGNOSIS — R55 Syncope and collapse: Secondary | ICD-10-CM | POA: Diagnosis not present

## 2012-06-04 DIAGNOSIS — Z7901 Long term (current) use of anticoagulants: Secondary | ICD-10-CM | POA: Diagnosis not present

## 2012-06-04 DIAGNOSIS — Z954 Presence of other heart-valve replacement: Secondary | ICD-10-CM | POA: Diagnosis not present

## 2012-06-06 ENCOUNTER — Ambulatory Visit
Admission: RE | Admit: 2012-06-06 | Discharge: 2012-06-06 | Disposition: A | Discharge status: Home / Self Care | Payer: Medicare Other | Source: Ambulatory Visit | Attending: Family Medicine | Admitting: Family Medicine

## 2012-06-06 DIAGNOSIS — Z1231 Encounter for screening mammogram for malignant neoplasm of breast: Secondary | ICD-10-CM

## 2012-06-16 DIAGNOSIS — R55 Syncope and collapse: Secondary | ICD-10-CM | POA: Diagnosis not present

## 2012-06-16 DIAGNOSIS — Z8669 Personal history of other diseases of the nervous system and sense organs: Secondary | ICD-10-CM | POA: Diagnosis not present

## 2012-06-16 DIAGNOSIS — R569 Unspecified convulsions: Secondary | ICD-10-CM | POA: Diagnosis not present

## 2012-06-25 DIAGNOSIS — M1710 Unilateral primary osteoarthritis, unspecified knee: Secondary | ICD-10-CM | POA: Diagnosis not present

## 2012-06-26 DIAGNOSIS — M949 Disorder of cartilage, unspecified: Secondary | ICD-10-CM | POA: Diagnosis not present

## 2012-06-27 ENCOUNTER — Other Ambulatory Visit: Payer: Self-pay | Admitting: Neurology

## 2012-06-27 DIAGNOSIS — Z8669 Personal history of other diseases of the nervous system and sense organs: Secondary | ICD-10-CM

## 2012-06-27 DIAGNOSIS — R55 Syncope and collapse: Secondary | ICD-10-CM

## 2012-06-27 DIAGNOSIS — R569 Unspecified convulsions: Secondary | ICD-10-CM

## 2012-07-02 DIAGNOSIS — Z7901 Long term (current) use of anticoagulants: Secondary | ICD-10-CM | POA: Diagnosis not present

## 2012-07-02 DIAGNOSIS — Z954 Presence of other heart-valve replacement: Secondary | ICD-10-CM | POA: Diagnosis not present

## 2012-07-09 DIAGNOSIS — M949 Disorder of cartilage, unspecified: Secondary | ICD-10-CM | POA: Diagnosis not present

## 2012-07-09 DIAGNOSIS — M899 Disorder of bone, unspecified: Secondary | ICD-10-CM | POA: Diagnosis not present

## 2012-07-09 DIAGNOSIS — M1710 Unilateral primary osteoarthritis, unspecified knee: Secondary | ICD-10-CM | POA: Diagnosis not present

## 2012-07-11 DIAGNOSIS — I4891 Unspecified atrial fibrillation: Secondary | ICD-10-CM | POA: Diagnosis not present

## 2012-07-11 DIAGNOSIS — I10 Essential (primary) hypertension: Secondary | ICD-10-CM | POA: Diagnosis not present

## 2012-07-11 DIAGNOSIS — I359 Nonrheumatic aortic valve disorder, unspecified: Secondary | ICD-10-CM | POA: Diagnosis not present

## 2012-07-11 DIAGNOSIS — R0602 Shortness of breath: Secondary | ICD-10-CM | POA: Diagnosis not present

## 2012-07-16 ENCOUNTER — Other Ambulatory Visit: Payer: Self-pay | Admitting: Cardiology

## 2012-07-16 DIAGNOSIS — Z954 Presence of other heart-valve replacement: Secondary | ICD-10-CM | POA: Diagnosis not present

## 2012-07-16 DIAGNOSIS — I4891 Unspecified atrial fibrillation: Secondary | ICD-10-CM | POA: Diagnosis not present

## 2012-07-16 DIAGNOSIS — R0602 Shortness of breath: Secondary | ICD-10-CM | POA: Diagnosis not present

## 2012-07-16 DIAGNOSIS — I359 Nonrheumatic aortic valve disorder, unspecified: Secondary | ICD-10-CM | POA: Diagnosis not present

## 2012-07-16 DIAGNOSIS — I10 Essential (primary) hypertension: Secondary | ICD-10-CM | POA: Diagnosis not present

## 2012-07-16 DIAGNOSIS — I509 Heart failure, unspecified: Secondary | ICD-10-CM | POA: Diagnosis not present

## 2012-07-16 DIAGNOSIS — Z7901 Long term (current) use of anticoagulants: Secondary | ICD-10-CM | POA: Diagnosis not present

## 2012-07-17 ENCOUNTER — Inpatient Hospital Stay (HOSPITAL_COMMUNITY)
Admission: AD | Admit: 2012-07-17 | Discharge: 2012-07-22 | DRG: 308 | Disposition: A | Discharge status: Home / Self Care | Payer: Medicare Other | Source: Ambulatory Visit | Attending: Cardiology | Admitting: Cardiology

## 2012-07-17 ENCOUNTER — Encounter: Payer: Self-pay | Admitting: Cardiology

## 2012-07-17 DIAGNOSIS — Z954 Presence of other heart-valve replacement: Secondary | ICD-10-CM | POA: Diagnosis not present

## 2012-07-17 DIAGNOSIS — I509 Heart failure, unspecified: Secondary | ICD-10-CM | POA: Diagnosis not present

## 2012-07-17 DIAGNOSIS — K5900 Constipation, unspecified: Secondary | ICD-10-CM | POA: Diagnosis present

## 2012-07-17 DIAGNOSIS — I5031 Acute diastolic (congestive) heart failure: Secondary | ICD-10-CM | POA: Diagnosis not present

## 2012-07-17 DIAGNOSIS — I10 Essential (primary) hypertension: Secondary | ICD-10-CM

## 2012-07-17 DIAGNOSIS — Z79899 Other long term (current) drug therapy: Secondary | ICD-10-CM

## 2012-07-17 DIAGNOSIS — Z8249 Family history of ischemic heart disease and other diseases of the circulatory system: Secondary | ICD-10-CM | POA: Diagnosis not present

## 2012-07-17 DIAGNOSIS — E669 Obesity, unspecified: Secondary | ICD-10-CM | POA: Diagnosis present

## 2012-07-17 DIAGNOSIS — I4891 Unspecified atrial fibrillation: Secondary | ICD-10-CM | POA: Diagnosis not present

## 2012-07-17 DIAGNOSIS — E7800 Pure hypercholesterolemia, unspecified: Secondary | ICD-10-CM | POA: Diagnosis present

## 2012-07-17 DIAGNOSIS — Z6838 Body mass index (BMI) 38.0-38.9, adult: Secondary | ICD-10-CM

## 2012-07-17 DIAGNOSIS — Z87891 Personal history of nicotine dependence: Secondary | ICD-10-CM

## 2012-07-17 DIAGNOSIS — Z801 Family history of malignant neoplasm of trachea, bronchus and lung: Secondary | ICD-10-CM | POA: Diagnosis not present

## 2012-07-17 DIAGNOSIS — I110 Hypertensive heart disease with heart failure: Secondary | ICD-10-CM | POA: Diagnosis not present

## 2012-07-17 DIAGNOSIS — Z800 Family history of malignant neoplasm of digestive organs: Secondary | ICD-10-CM | POA: Diagnosis not present

## 2012-07-17 DIAGNOSIS — Z7901 Long term (current) use of anticoagulants: Secondary | ICD-10-CM | POA: Diagnosis not present

## 2012-07-17 DIAGNOSIS — Z888 Allergy status to other drugs, medicaments and biological substances status: Secondary | ICD-10-CM | POA: Diagnosis not present

## 2012-07-17 DIAGNOSIS — I480 Paroxysmal atrial fibrillation: Secondary | ICD-10-CM | POA: Diagnosis present

## 2012-07-17 DIAGNOSIS — J9819 Other pulmonary collapse: Secondary | ICD-10-CM | POA: Diagnosis not present

## 2012-07-17 DIAGNOSIS — E785 Hyperlipidemia, unspecified: Secondary | ICD-10-CM | POA: Diagnosis present

## 2012-07-17 DIAGNOSIS — R0609 Other forms of dyspnea: Secondary | ICD-10-CM | POA: Diagnosis not present

## 2012-07-17 DIAGNOSIS — E876 Hypokalemia: Secondary | ICD-10-CM | POA: Diagnosis present

## 2012-07-17 DIAGNOSIS — I119 Hypertensive heart disease without heart failure: Secondary | ICD-10-CM | POA: Diagnosis not present

## 2012-07-17 DIAGNOSIS — Z836 Family history of other diseases of the respiratory system: Secondary | ICD-10-CM

## 2012-07-17 DIAGNOSIS — I359 Nonrheumatic aortic valve disorder, unspecified: Secondary | ICD-10-CM | POA: Diagnosis not present

## 2012-07-17 HISTORY — DX: Other complications of anesthesia, initial encounter: T88.59XA

## 2012-07-17 HISTORY — DX: Hyperlipidemia, unspecified: E78.5

## 2012-07-17 HISTORY — DX: Traumatic subdural hemorrhage with loss of consciousness of unspecified duration, initial encounter: S06.5X9A

## 2012-07-17 HISTORY — DX: Obesity, unspecified: E66.9

## 2012-07-17 HISTORY — DX: Anxiety disorder, unspecified: F41.9

## 2012-07-17 HISTORY — DX: Traumatic subdural hemorrhage with loss of consciousness status unknown, initial encounter: S06.5XAA

## 2012-07-17 HISTORY — DX: Adverse effect of unspecified anesthetic, initial encounter: T41.45XA

## 2012-07-17 HISTORY — DX: Essential (primary) hypertension: I10

## 2012-07-17 HISTORY — DX: Nonrheumatic aortic (valve) stenosis: I35.0

## 2012-07-17 LAB — COMPREHENSIVE METABOLIC PANEL
ALT: 58 U/L — ABNORMAL HIGH (ref 0–35)
Albumin: 3.5 g/dL (ref 3.5–5.2)
Calcium: 8.7 mg/dL (ref 8.4–10.5)
GFR calc Af Amer: >90 mL/min (ref >90)
Glucose, Bld: 151 mg/dL — ABNORMAL HIGH (ref 70–99)
Sodium: 141 mEq/L (ref 135–145)
Total Protein: 6.8 g/dL (ref 6.0–8.3)

## 2012-07-17 LAB — CBC WITH DIFFERENTIAL/PLATELET
Basophils Relative: 1 % (ref 0–1)
Eosinophils Absolute: 0.2 10*3/uL (ref 0.0–0.7)
Eosinophils Relative: 2 % (ref 0–5)
Lymphs Abs: 1.8 10*3/uL (ref 0.7–4.0)
MCH: 29.5 pg (ref 26.0–34.0)
MCHC: 34.5 g/dL (ref 30.0–36.0)
MCV: 85.6 fL (ref 78.0–100.0)
Neutrophils Relative %: 66 % (ref 43–77)
Platelets: 213 10*3/uL (ref 150–400)
RDW: 14.2 % (ref 11.5–15.5)

## 2012-07-17 LAB — PROTIME-INR: INR: 1.98 — ABNORMAL HIGH (ref 0.00–1.49)

## 2012-07-17 LAB — APTT: aPTT: 40 seconds — ABNORMAL HIGH (ref 24–37)

## 2012-07-17 LAB — PRO B NATRIURETIC PEPTIDE: Pro B Natriuretic peptide (BNP): 1294 pg/mL — ABNORMAL HIGH (ref 0–125)

## 2012-07-17 MED ORDER — SODIUM CHLORIDE 0.9 % IJ SOLN
3.0000 mL | INTRAMUSCULAR | Status: DC | PRN
  Administered 2012-07-20 (×2): 3 mL via INTRAVENOUS

## 2012-07-17 MED ORDER — WARFARIN SODIUM 5 MG PO TABS
5.0000 mg | ORAL_TABLET | Freq: Once | ORAL | Status: AC
  Administered 2012-07-17: 5 mg via ORAL
  Filled 2012-07-17: qty 1

## 2012-07-17 MED ORDER — VITAMIN D3 25 MCG (1000 UNIT) PO TABS
1000.0000 [IU] | ORAL_TABLET | Freq: Every day | ORAL | Status: DC
  Administered 2012-07-18 – 2012-07-21 (×4): 1000 [IU] via ORAL
  Filled 2012-07-17 (×5): qty 1

## 2012-07-17 MED ORDER — FUROSEMIDE 10 MG/ML IJ SOLN
INTRAMUSCULAR | Status: AC
  Filled 2012-07-17: qty 4

## 2012-07-17 MED ORDER — SODIUM CHLORIDE 0.9 % IJ SOLN
3.0000 mL | Freq: Two times a day (BID) | INTRAMUSCULAR | Status: DC
  Administered 2012-07-17 – 2012-07-22 (×8): 3 mL via INTRAVENOUS

## 2012-07-17 MED ORDER — SERTRALINE HCL 100 MG PO TABS
100.0000 mg | ORAL_TABLET | Freq: Every day | ORAL | Status: DC
  Filled 2012-07-17: qty 1

## 2012-07-17 MED ORDER — ZOLPIDEM TARTRATE 5 MG PO TABS
5.0000 mg | ORAL_TABLET | Freq: Every evening | ORAL | Status: DC | PRN
  Administered 2012-07-17 – 2012-07-21 (×5): 5 mg via ORAL
  Filled 2012-07-17 (×5): qty 1

## 2012-07-17 MED ORDER — WARFARIN - PHARMACIST DOSING INPATIENT
Freq: Every day | Status: DC
  Administered 2012-07-21 – 2012-07-22 (×2)

## 2012-07-17 MED ORDER — WARFARIN VIDEO
Freq: Once | Status: AC
  Administered 2012-07-17: 22:00:00

## 2012-07-17 MED ORDER — POTASSIUM CHLORIDE CRYS ER 20 MEQ PO TBCR
EXTENDED_RELEASE_TABLET | ORAL | Status: AC
  Filled 2012-07-17: qty 2

## 2012-07-17 MED ORDER — TRETINOIN 0.05 % EX CREA: 1.0000 | TOPICAL_CREAM | Freq: Every day | CUTANEOUS | Status: DC

## 2012-07-17 MED ORDER — DILTIAZEM HCL ER 240 MG PO CP24
240.0000 mg | ORAL_CAPSULE | Freq: Every day | ORAL | Status: DC
  Administered 2012-07-18 – 2012-07-22 (×5): 240 mg via ORAL
  Filled 2012-07-17 (×5): qty 1

## 2012-07-17 MED ORDER — POTASSIUM CHLORIDE CRYS ER 20 MEQ PO TBCR
40.0000 meq | EXTENDED_RELEASE_TABLET | Freq: Once | ORAL | Status: AC
  Administered 2012-07-17: 40 meq via ORAL
  Filled 2012-07-17: qty 2

## 2012-07-17 MED ORDER — FUROSEMIDE 10 MG/ML IJ SOLN
40.0000 mg | Freq: Every day | INTRAMUSCULAR | Status: DC
  Administered 2012-07-17: 40 mg via INTRAVENOUS
  Filled 2012-07-17: qty 4

## 2012-07-17 MED ORDER — COUMADIN BOOK
Freq: Once | Status: AC
  Administered 2012-07-17: 23:00:00
  Filled 2012-07-17: qty 1

## 2012-07-17 MED ORDER — SERTRALINE HCL 50 MG PO TABS: 50.0000 mg | ORAL_TABLET | Freq: Every day | ORAL | Status: DC

## 2012-07-17 MED ORDER — ACETAMINOPHEN 325 MG PO TABS: 650.0000 mg | ORAL_TABLET | ORAL | Status: DC | PRN

## 2012-07-17 MED ORDER — LOSARTAN POTASSIUM 50 MG PO TABS
50.0000 mg | ORAL_TABLET | Freq: Every day | ORAL | Status: DC
  Administered 2012-07-18 – 2012-07-22 (×5): 50 mg via ORAL
  Filled 2012-07-17 (×5): qty 1

## 2012-07-17 MED ORDER — METOPROLOL TARTRATE 25 MG PO TABS
25.0000 mg | ORAL_TABLET | Freq: Two times a day (BID) | ORAL | Status: DC
  Administered 2012-07-17 – 2012-07-22 (×10): 25 mg via ORAL
  Filled 2012-07-17 (×12): qty 1

## 2012-07-17 MED ORDER — FUROSEMIDE 10 MG/ML IJ SOLN: 40.0000 mg | Freq: Every day | INTRAMUSCULAR | Status: DC

## 2012-07-17 MED ORDER — SODIUM CHLORIDE 0.9 % IV SOLN: 250.0000 mL | INTRAVENOUS | Status: DC | PRN

## 2012-07-17 MED ORDER — CALCIUM CARBONATE-VITAMIN D 500-200 MG-UNIT PO TABS
1.0000 | ORAL_TABLET | Freq: Every day | ORAL | Status: DC
  Administered 2012-07-18 – 2012-07-21 (×4): 1 via ORAL
  Filled 2012-07-17 (×5): qty 1

## 2012-07-17 MED ORDER — ONDANSETRON HCL 4 MG/2ML IJ SOLN: 4.0000 mg | Freq: Four times a day (QID) | INTRAMUSCULAR | Status: DC | PRN

## 2012-07-17 MED ORDER — SERTRALINE HCL 50 MG PO TABS
150.0000 mg | ORAL_TABLET | Freq: Every day | ORAL | Status: DC
  Administered 2012-07-17 – 2012-07-21 (×5): 150 mg via ORAL
  Filled 2012-07-17 (×7): qty 1

## 2012-07-17 MED ORDER — POTASSIUM CHLORIDE CRYS ER 20 MEQ PO TBCR
40.0000 meq | EXTENDED_RELEASE_TABLET | ORAL | Status: AC
  Administered 2012-07-17: 40 meq via ORAL

## 2012-07-17 NOTE — H&P (Signed)
Patient: [name], [name] Account Number: [id number] Provider: Michaell Cowing. Emelda Fear, NP  DOB: 1945-12-14 Age: 67 Y Sex: Female Date: 07/16/2012  Phone: [phone]   Address: [address] [place], [place]  Pcp: Carolin Coy          1. TT/1WK FOLLOW UP & SEE JEREMY.        HPI:  General:  [name] is a 67 yo female followed by Dr Mayford Knife with a hx of Atrial fibrillation and Aortic Valve replacement with St Jude prosthetic valve 2006, on chronic Coumadin therapy keeping INRs 1.8-2.0 after having problems with intracranial bleed 6 weeks after valve surgery. She was seen last week due to having increasing shortness of breath and swelling in ankles over the prior 2 weeks. along with increase fatigue, and a dry cough. She started gaining weight from 230 up to 234, and today up to 240. At office visit last week her weight was up 12 lbs on office scales and it was noted she was in Atrial fibrillation RVR rate of 128. We stopped Norvasc and Diltiazem 240 mg qd started along with Lasix 20 mg po qd, BNP was 281. Dr Mayford Knife plans to cardiovert pt in the near future. She feels her SOB and cough has continued but no worse. She is not sleeping well and has awakend a few times with PND, but not every night. Her home weight has not improved on the Lasix but actually increased. She denies chest pain, dizziness nor syncope. She is constipation since starting Diltiazem. no Nausea nor vomiting. .        ROS:  as noted in HPI, no signs of bleeding nor neurological changes.       Medical History: Obesity, Hypertension, aortic stenosis status post aortic valve replacement with St. Jude mechanical prosthesis, prior left subdural hematoma requiring evacuation via twist drill craniotomy in May 2006 secondary to INR of 2.2., systemic anticoagulation with INR goal of 1.8-2.0 secondary history of intracranial bleed - on for her AVR, Beta blocker intolerance, Elevated cholesterol.        Surgical  History: status post aortic valve replacement with 21 mm St. Jude Regent valve (Dr. Zenaida Niece Tright) 02/06, status post twist drill craniotomy for evacuation of left subdural hematoma 05/06, knee surgery 1980.        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 05/2004, not in the past year .        Family History: Father: deceased 66 yrs, elevated cholesterol, hypertension, cancer of esophagusMother: deceased 30 yrs, lung cancer, hypertensionBrother 1: alive 25 yrs, heart disease, emphysema, defibrilatorSister 1: alive 13 yrs, Fibromyalgia, hepatitiis in past neg. f. hx. colon/breast cancer Denies family history of colon polyps.       Social History:  General: History of smoking cigarettes: Never smoked. no Smoking. no Alcohol. Caffeine: yes, coffee, 2 servings daily. no Recreational drug use. Exercise: yes, 4 x week, walks when able. Occupation: [employer], retired. Marital Status: married. Children: 1, Boys.        Medications: Taking Tylenol Extra Strength 500 MG Tablet 2 tablets as needed, Taking Centrum Silver Tablet 1 tablet once a day, Taking Oscal 500/200 D-3 500-200 MG-UNIT Tablet 1 tablets Once a day, Taking Coumadin 1 MG Tablet 1 tablet to take with 5 MG warfarin, Taking Zoloft 100 Milligram Tablet TAKE 1 AND 1/2 TABLETS BY MOUTH ONCE DAILY , Taking Coumadin 5 MG Tablet 5 mg qd except 6 mg  M Once a day, Taking Ibandronate Sodium 150 MG Tablet 1 tablet , Notes: prescribed to take once a month, Taking Losartan Potassium 50.0 Milligram Tablet TAKE 1 TABLET BY MOUTH DAILY , Taking Diltiazem HCl CR 240 MG Capsule Extended Release 24 Hour 1 capsule every morning on an empty stomach Once a day, Taking Lasix 20 MG Tablet 1 tablet Once a dayand extra as directed, Taking Ambien 10 MG Tablet 1/2-1 tablet at bedtime as needed, Medication List reviewed and reconciled with the patient       Allergies: Codeine (for allergy):  jerking: Allergy, Dilantin: rash: Allergy, Toprol XL: unknown: Side Effects, Beta blockers: depression.           Vitals: Wt 248.2, Wt change 6 lb, Ht 65, BMI 41.30, Pulse sitting 100, BP sitting 130/92.       Examination:  Cardiology, General:  GENERAL APPEARANCE: NAD, alert and oriented. HEENT: unremarkable. CAROTID UPSTROKE: normal, no bruit. JVD: elevation. HEART SOUNDS: irregularly irregular, normal S1, S2, no S3 or S4, prosthetic valvular sound. MURMUR: absent. LUNGS: no rales or wheezes. ABDOMEN: soft, non tender, positive bowel sounds,. EXTREMITIES: bilateral ankle edema. PERIPHERAL PULSES: 2 plus bilateral.            Assessment:  1. Atrial fibrillation - 427.31 (Primary)  2. Aortic valve disorders - 424.1  3. Hypertension - 401.9  4. Shortness of breath - 786.05  5. CHF (congestive heart failure) - 428.0        1. Atrial fibrillation  Continue Diltiazem HCl CR Capsule Extended Release 24 Hour, 240 MG, 1 capsule every morning on an empty stomach, Orally, Once a day ; Start Metoprolol Tartrate Tablet, 25 MG, 1 tablet, Orally, Twice a day, 30 day(s), 60, Refills 3 .  LAB: Basic Metabolic LAB: CBC with Diff Imaging: EKG Atrial fibrillation rate 100      Harward,Amy 07/16/2012 10:47:04 AM >     Notes: Reviewed Cardioversion procedure including potential risk including skin irritation, strokes, arrthymias, and reaction to sedatives, Plan of care with Dr Mayford Knife, we will obtain echochardiogram today in light of previous valve surgery, new onset A fib and CHF/ fluid decompensation, Coumadin therapy followed today by Alfonse Ras Pharm D. also starting Metoprolol today for better heart rate control.        2. Aortic valve disorders  Continue Warfarin Sodium Tablet, 1 Milligram, TAKE 1 TABLET BY MOUTH DAILY ALONG WITH 5MG  TABLET .       3. Hypertension  Continue Losartan Potassium Tablet, 50.0 Milligram, TAKE 1 TABLET BY MOUTH DAILY .       4. Shortness of breath  Continue  Lasix Tablet, 20 MG, 1 tablet, Orally, take 4 tablets today and call with update of weight tomorrow .       5. CHF (congestive heart failure)  Imaging: EC Echocardiogram (Ordered for 07/16/2012) Notes: pt has conitnued to gain weight despite starting Lasix 20 mg po qd last week, with stable BMET she will take LAsix 80 mg po today total and obtain weight while in office tomorrow to see Riki Rusk, we may consider repeating dosage tomorrow but I woul like to see if she responds to therapy.        Procedures:  Venipuncture:  Venipuncture: Howell,Kathleen 07/16/2012 03:43:21 PM > , performed in right arm.           Labs:    Lab: Basic Metabolic  GLUCOSE 108 H 70-99 - mg/dL  BUN 13  1-61 - mg/dL  CREATININE 0.96 L  0.60-1.30 - mg/dl  eGFR (NON-AFRICAN AMERICAN) 108  >60 - calc  eGFR (AFRICAN AMERICAN) 131  >60 - calc  SODIUM 139  136-145 - mmol/L  POTASSIUM 4.2  3.5-5.5 - mmol/L  CHLORIDE 102  98-107 - mmol/L  C02 31  22-32 - mmol/L  ANION GAP 9.8  6.0-20.0 - mmol/L  CALCIUM 9.8  8.6-10.3 - mg/dL   FERGUSON,CYNTHIA A 16/01/9603 04:27:38 AM > will review while in office        Lab: CBC with Diff  WBC 6.4  4.0-11.0 - K/ul  RBC 4.31  4.20-5.40 - M/uL  HGB 12.2  12.0-16.0 - g/dL  HCT 54.0  98.1-19.1 - %  MCH 28.4  27.0-33.0 - pg  MPV 8.0  7.5-10.7 - fL  MCV 87.3  81.0-99.0 - fL  MCHC 32.5  32.0-36.0 - g/dL  RDW 47.8  29.5-62.1 - %  NRBC# 0.00  -   PLT 252  150-400 - K/uL  NEUT % 64.4  43.3-71.9 - %  NRBC% 0.00  - %  LYMPH% 22.0  16.8-43.5 - %  MONO % 10.4  4.6-12.4 - %  EOS % 2.2  0.0-7.8 - %  BASO % 1.0  0.0-1.0 - %  NEUT # 4.1  1.9-7.2 - K/uL  LYMPH# 1.40  1.10-2.70 - K/uL  MONO # 0.7  0.3-0.8 - K/uL  EOS # 0.1  0.0-0.6 - K/uL  BASO # 0.1  0.0-0.1 - K/uL   FERGUSON,CYNTHIA A 07/17/2012 04:28:00 AM > stable fpr cardioversion          Procedure Codes: 30865 EKG I AND R, 85025 ECL CBC PLATELET DIFF, 80048 ECL BMP, 78469 BLOOD COLLECTION ROUTINE VENIPUNCTURE       Follow  Up: TT pending cardioversion (Reason: Atrial fibrillation)         Provider: Michaell Cowing. Emelda Fear, NP  Patient: [name], [name] DOB: Apr 08, 1946 Date: 07/16/2012

## 2012-07-17 NOTE — H&P (Signed)
Office Visit     Patient: [name], [name] Account Number: [id number] Provider: Armanda Magic, MD  DOB: July 03, 1945 Age: 67 Y Sex: Female Date: 07/17/2012  Phone: [phone]   Address: [address] [place], [id number]  Pcp: Carolin Coy          1. TT/Per TT.        HPI:  General:  [name] is a 67 yo female followed by Dr Mayford Knife with a hx of Atrial fibrillation and Aortic Valve replacement with St Jude prosthetic valve 2006, on chronic Coumadin therapy keeping INRs 1.8-2.0 after having problems with intracranial bleed 6 weeks after valve surgery. She was seen last week due to having increasing shortness of breath and swelling in ankles over the prior 2 weeks. along with increase fatigue, and a dry cough. She started gaining weight from 230 up to 234, and today up to 240. At office visit last week her weight was up 12 lbs on office scales and it was noted she was in Atrial fibrillation RVR rate of 128. We stopped Norvasc and Diltiazem 240 mg qd started along with Lasix 20 mg po qd, BNP was 281. Dr Mayford Knife plans to cardiovert pt in the near future. She feels her SOB and cough has continued but no worse. She is not sleeping well and has awakend a few times with PND, but not every night. Her home weight has not improved on the Lasix but actually increased. She denies chest pain, dizziness nor syncope. She is constipation since starting Diltiazem. no Nausea nor vomiting. Yesterday she was seen by my NP and started on Metoprolol 25mg  BID and Lasix was increased to 80mg . Today she comes in for repeat INR check due to INR being elevated at 2.6 yesterday. She complains of feeling worse. Her SOB has not improved and she thinks it is worse and she had 3 pillow orthopnea last PM. Her legs were swollen worse last night but are better this am. .        ROS:  See HPI, A twelve system review was perfomed at today's visit. For pertinent positives and negatives see HPI.       Medical  History: Obesity, Hypertension, aortic stenosis status post aortic valve replacement with St. Jude mechanical prosthesis, prior left subdural hematoma requiring evacuation via twist drill craniotomy in May 2006 secondary to INR of 2.2., systemic anticoagulation with INR goal of 1.8-2.0 secondary history of intracranial bleed - on for her AVR, Beta blocker intolerance, Elevated cholesterol.        Surgical History: status post aortic valve replacement with 21 mm St. Jude Regent valve (Dr. Zenaida Niece Tright) 02/06, status post twist drill craniotomy for evacuation of left subdural hematoma 05/06, knee surgery 1980.        Family History: Father: deceased 62 yrs, elevated cholesterol, hypertension, cancer of esophagusMother: deceased 8 yrs, lung cancer, hypertensionBrother 1: alive 84 yrs, heart disease, emphysema, defibrilatorSister 1: alive 55 yrs, Fibromyalgia, hepatitiis in past       Social History:  General:  History of smoking cigarettes: Never smoked.  no Smoking.  no Alcohol.  Caffeine: yes, coffee, 2 servings daily.  no Recreational drug use.  Exercise: yes, 4 x week, walks when able.  Occupation: [employer], retired.  Marital Status: married.  Children: 1, Boys.        Medications: Taking Tylenol Extra Strength 500 MG Tablet 2 tablets as needed, Taking Centrum Silver Tablet 1 tablet once a day, Taking Oscal 500/200 D-3 500-200 MG-UNIT  Tablet 1 tablets Once a day, Taking Coumadin 1 MG Tablet 1 tablet to take with 5 MG warfarin, Taking Zoloft 100 Milligram Tablet TAKE 1 AND 1/2 TABLETS BY MOUTH ONCE DAILY , Taking Warfarin Sodium 1 Milligram Tablet TAKE 1 TABLET BY MOUTH DAILY ALONG WITH 5MG  TABLET , Taking Ibandronate Sodium 150 MG Tablet 1 tablet , Notes: prescribed to take once a month, Taking Losartan Potassium 50.0 Milligram Tablet TAKE 1 TABLET BY MOUTH DAILY , Taking Diltiazem HCl CR 240 MG Capsule Extended Release 24 Hour 1 capsule every morning on an empty stomach Once a day,  Taking Lasix 20 MG Tablet 1 tablet Once a dayand extra as directed, Taking Ambien 10 MG Tablet 1/2-1 tablet at bedtime as needed, Taking Coumadin 5 MG Tablet 5 mg qd except 6 mg M Once a day       Allergies: Codeine (for allergy): jerking: Allergy, Dilantin: rash: Allergy, Toprol XL: unknown: Side Effects, Beta blockers: depression.           Examination:  Cardiology, General:  GENERAL APPEARANCE: pleasant, NAD.  HEENT: unremarkable.  CAROTID UPSTROKE: normal, no bruit.  JVD: flat.  HEART SOUNDS: regular, normal S1, S2, no S3 or S4.  MURMUR: absent.  LUNGS: no rales or wheezes.  ABDOMEN: soft, non tender, positive bowel sounds, no masses felt.  EXTREMITIES: no leg edema.  PERIPHERAL PULSES: 2 plus bilateral.            Assessment:  1. Acute diastolic HF (heart failure), NYHA class 3 - 428.31 (Primary)  2. Aortic valve disorders - 424.1  3. Benign hypertensive heart disease without heart failure - 402.10  4. Anticoagulant monitoring - V58.61  5. Encounter for long-term (current) use of med (excludes anticoagulant, NSAIDS, steroids, aspirin, insulin) - V58.69  6. Atrial fibrillation - 427.31        1. Acute diastolic HF (heart failure), NYHA class 3  Notes: She is in acute diastolic CHF due to atrial fibrillation with RVR that has not responded to outpatient therapy. I have recommended inpatient admission for IV diuretics.       2. Benign hypertensive heart disease without heart failure  Continue Losartan Potassium Tablet, 50.0 Milligram, TAKE 1 TABLET BY MOUTH DAILY ; Continue Diltiazem HCl CR Capsule Extended Release 24 Hour, 240 MG, 1 capsule every morning on an empty stomach, Orally, Once a day .       3. Anticoagulant monitoring  Notes: Her INR is maintained from 1.8-2.1 due to history of cerebral bleed with INR 2.3 in the past. She has to be anticoagulated due to mechanical AVR.       4. Atrial fibrillation  Continue Coumadin Tablet, 5 MG, 5 mg qd except 6 mg M,  Orally, Once a day ; Continue Coumadin 1 MG Tablet, 1 tablet, Orally, to take with 5 MG warfarin .  Notes: She will be continued on beta blockers and medications adjusted for better rate control. She will be set up for DCCV tomorrow by Dr. Anne Fu. Her INR has been 2 or above for the last 4 weeks.        Follow Up: ADMIT         Provider: Armanda Magic, MD  Patient: [name], [name] DOB: 1945-10-31 Date: 07/17/2012

## 2012-07-17 NOTE — Progress Notes (Signed)
ANTICOAGULATION CONSULT NOTE - Initial Consult  Pharmacy Consult for Coumadin Indication: atrial fibrillation  Allergies  Allergen Reactions  . Codeine Anaphylaxis    jerking  . Toprol Xl (Metoprolol Tartrate)     depression  . Dilantin (Phenytoin Sodium Extended) Rash    Patient Measurements: Height: 5\' 7"  (170.2 cm) Weight: 242 lb 4.6 oz (109.9 kg) IBW/kg (Calculated) : 61.6 Heparin Dosing Weight:   Vital Signs: Temp: 97.9 F (36.6 C) (04/10 2048) Temp src: Oral (04/10 2048) BP: 106/58 mmHg (04/10 2048) Pulse Rate: 90 (04/10 2048)  Labs:  Recent Labs  07/17/12 1916  HGB 12.3  HCT 35.7*  PLT 213  APTT 40*  LABPROT 21.7*  INR 1.98*  CREATININE 0.75    Estimated Creatinine Clearance: 87.2 ml/min (by C-G formula based on Cr of 0.75).   Medical History: Past Medical History  Diagnosis Date  . Obesity   . Hypertension   . Aortic stenosis   . Subdural hematoma     in setting of INR greater than 2.2  . Dyslipidemia   . Anxiety     Medications:  Scheduled:  . [START ON 07/18/2012] calcium-vitamin D  1 tablet Oral Daily  . [START ON 07/18/2012] cholecalciferol  1,000 Units Oral Daily  . [START ON 07/18/2012] diltiazem  240 mg Oral Daily  . [COMPLETED] furosemide      . furosemide  40 mg Intravenous Daily  . [START ON 07/18/2012] losartan  50 mg Oral Daily  . metoprolol tartrate  25 mg Oral BID  . sertraline  150 mg Oral QHS  . sodium chloride  3 mL Intravenous Q12H  . [START ON 07/19/2012] tretinoin  1 Applicatorful Topical QHS  . [DISCONTINUED] furosemide  40 mg Intravenous Daily  . [DISCONTINUED] sertraline  100 mg Oral QHS  . [DISCONTINUED] sertraline  50 mg Oral Daily    Assessment: 67yo female with AFib and history of St.Jude AVR as well as history of intracranial bleed 6 weeks after valve replacement surgery.  Goal INR has therefore been adjusted down.  Her INR was elevated at 2.6 on Tues, 4/9 and she has not taken her Coumadin since then as a  result.  Tonight her INR is 1.98 and Coumadin is resumed.  Goal of Therapy:  INR 1.9-2.1 per MD Monitor platelets by anticoagulation protocol: Yes   Plan:  1.  Coumadin 5mg  2.  Daily INR 3.  Coumadin book and video  Marisue Humble, PharmD Clinical Pharmacist [facility]

## 2012-07-18 ENCOUNTER — Encounter (HOSPITAL_COMMUNITY): Payer: Self-pay | Admitting: Certified Registered Nurse Anesthetist

## 2012-07-18 ENCOUNTER — Encounter (HOSPITAL_COMMUNITY): Payer: Self-pay | Admitting: Certified Registered"

## 2012-07-18 ENCOUNTER — Observation Stay (HOSPITAL_COMMUNITY): Payer: Medicare Other

## 2012-07-18 DIAGNOSIS — I10 Essential (primary) hypertension: Secondary | ICD-10-CM | POA: Diagnosis not present

## 2012-07-18 DIAGNOSIS — I509 Heart failure, unspecified: Secondary | ICD-10-CM | POA: Diagnosis not present

## 2012-07-18 DIAGNOSIS — I4891 Unspecified atrial fibrillation: Secondary | ICD-10-CM | POA: Diagnosis not present

## 2012-07-18 DIAGNOSIS — I110 Hypertensive heart disease with heart failure: Secondary | ICD-10-CM | POA: Diagnosis not present

## 2012-07-18 DIAGNOSIS — Z7901 Long term (current) use of anticoagulants: Secondary | ICD-10-CM | POA: Diagnosis not present

## 2012-07-18 DIAGNOSIS — E876 Hypokalemia: Secondary | ICD-10-CM | POA: Diagnosis not present

## 2012-07-18 DIAGNOSIS — J9819 Other pulmonary collapse: Secondary | ICD-10-CM | POA: Diagnosis not present

## 2012-07-18 DIAGNOSIS — I5031 Acute diastolic (congestive) heart failure: Secondary | ICD-10-CM | POA: Diagnosis not present

## 2012-07-18 LAB — PROTIME-INR
INR: 1.79 — ABNORMAL HIGH (ref 0.00–1.49)
Prothrombin Time: 20.2 seconds — ABNORMAL HIGH (ref 11.6–15.2)

## 2012-07-18 LAB — BASIC METABOLIC PANEL
BUN: 15 mg/dL (ref 6–23)
CO2: 29 mEq/L (ref 19–32)
Calcium: 8.5 mg/dL (ref 8.4–10.5)
Creatinine, Ser: 0.7 mg/dL (ref 0.50–1.10)
GFR calc non Af Amer: 88 mL/min — ABNORMAL LOW (ref >90)
Glucose, Bld: 109 mg/dL — ABNORMAL HIGH (ref 70–99)

## 2012-07-18 LAB — TSH: TSH: 3.21 u[IU]/mL (ref 0.350–4.500)

## 2012-07-18 MED ORDER — POTASSIUM CHLORIDE CRYS ER 20 MEQ PO TBCR
40.0000 meq | EXTENDED_RELEASE_TABLET | Freq: Once | ORAL | Status: AC
  Administered 2012-07-18: 40 meq via ORAL
  Filled 2012-07-18: qty 2

## 2012-07-18 MED ORDER — FUROSEMIDE 10 MG/ML IJ SOLN
80.0000 mg | Freq: Two times a day (BID) | INTRAMUSCULAR | Status: DC
  Administered 2012-07-18 – 2012-07-22 (×8): 80 mg via INTRAVENOUS
  Filled 2012-07-18 (×10): qty 8

## 2012-07-18 MED ORDER — POTASSIUM CHLORIDE CRYS ER 20 MEQ PO TBCR
20.0000 meq | EXTENDED_RELEASE_TABLET | Freq: Every day | ORAL | Status: DC
  Administered 2012-07-19 – 2012-07-21 (×3): 20 meq via ORAL
  Filled 2012-07-18 (×4): qty 1
  Filled 2012-07-18: qty 2

## 2012-07-18 MED ORDER — FUROSEMIDE 10 MG/ML IJ SOLN: 40.0000 mg | Freq: Two times a day (BID) | INTRAMUSCULAR | Status: DC

## 2012-07-18 MED ORDER — WARFARIN SODIUM 5 MG PO TABS
5.0000 mg | ORAL_TABLET | Freq: Once | ORAL | Status: AC
  Administered 2012-07-18: 5 mg via ORAL
  Filled 2012-07-18: qty 1

## 2012-07-18 NOTE — Anesthesia Preprocedure Evaluation (Signed)
Anesthesia Evaluation    Airway       Dental   Pulmonary          Cardiovascular hypertension, Pt. on home beta blockers and Pt. on medications +CHF + dysrhythmias Atrial Fibrillation  4/10 BNP: 1294 AVR 2006- intracranial bleed 6 weeks after surgery   Neuro/Psych    GI/Hepatic GERD-  ,  Endo/Other    Renal/GU      Musculoskeletal   Abdominal   Peds  Hematology   Anesthesia Other Findings   Reproductive/Obstetrics                           Anesthesia Physical Anesthesia Plan Anesthesia Quick Evaluation

## 2012-07-18 NOTE — Progress Notes (Signed)
SUBJECTIVE:  Feeling better.  HR much better controlled  OBJECTIVE:   Vitals:   Filed Vitals:   07/17/12 1351 07/17/12 2048 07/18/12 0507 07/18/12 0714  BP: 102/60 106/58 105/71   Pulse: 82 90 83   Temp: 97.8 F (36.6 C) 97.9 F (36.6 C) 97.5 F (36.4 C)   TempSrc: Oral Oral Oral   Resp: 18 18 18    Height: 5\' 7"  (1.702 m)     Weight: 109.9 kg (242 lb 4.6 oz)   108.591 kg (239 lb 6.4 oz)  SpO2: 94% 95% 95%    I&O's:   Intake/Output Summary (Last 24 hours) at 07/18/12 0981 Last data filed at 07/17/12 1900  Gross per 24 hour  Intake    600 ml  Output      0 ml  Net    600 ml   TELEMETRY: Reviewed telemetry pt in atrial fibrillation     PHYSICAL EXAM General: Well developed, well nourished, in no acute distress Head: Eyes PERRLA, No xanthomas.   Normal cephalic and atramatic  Lungs:   Clear bilaterally to auscultation and percussion. Heart:   Irregularly irregular S1 S2 Pulses are 2+ & equal. Abdomen: Bowel sounds are positive, abdomen soft and non-tender without masses  Extremities:   No clubbing, cyanosis or edema.  DP +1 Neuro: Alert and oriented X 3. Psych:  Good affect, responds appropriately   LABS: Basic Metabolic Panel:  Recent Labs  19/14/78 1916 07/18/12 0520  NA 141 140  K 3.2* 3.4*  CL 102 104  CO2 27 29  GLUCOSE 151* 109*  BUN 15 15  CREATININE 0.75 0.70  CALCIUM 8.7 8.5  MG 1.7  --    Liver Function Tests:  Recent Labs  07/17/12 1916  AST 47*  ALT 58*  ALKPHOS 92  BILITOT 0.3  PROT 6.8  ALBUMIN 3.5   No results found for this basename: LIPASE, AMYLASE,  in the last 72 hours CBC:  Recent Labs  07/17/12 1916  WBC 7.5  NEUTROABS 5.0  HGB 12.3  HCT 35.7*  MCV 85.6  PLT 213   Thyroid Function Tests:  Recent Labs  07/17/12 1916  TSH 3.210   Anemia Panel: No results found for this basename: VITAMINB12, FOLATE, FERRITIN, TIBC, IRON, RETICCTPCT,  in the last 72 hours Coag Panel:   Lab Results  Component Value Date   INR  1.79* 07/18/2012   INR 1.98* 07/17/2012    RADIOLOGY: Dg Chest 2 View  07/18/2012  *RADIOLOGY REPORT*  Clinical Data: Congestive heart failure.  CHEST - 2 VIEW  Comparison: 08/17/2004.  Findings: Trachea is midline.  Heart is at the upper limits of normal in size, stable.  Minimal linear opacification at the left lung base.  Lungs are otherwise clear.  No pleural fluid.  IMPRESSION: Left basilar subsegmental atelectasis.   Original Report Authenticated By: Leanna Battles, M.D.       ASSESSMENT:  1.  New onset atrial fibrillation now rate controlled 2.  Acute diastolic CHF 3.  Aortic stenosis s/p mechanical AVR 4.  History of subdural hematoma on coumadin with INR 2.2 5.  HTN 6.  Dyslipidemia 7.  Systemic anticoagulation with INR goal for AVR 1.8-2.1 - coumadin held Wed 4/9 for INR 2.6 and now subtherapeutic. 8.  Hypokalemia  PLAN:   1.  Difficult situation - need to keep INR at 2 for DCCV to avoid risk of CVA but there is a risk of cerebral bleed with higher INR.  I think it  is too risky to cardiovert today and cover with Heparin until INR therapeutic given risk of cerebral bleed.  She is symptomatically improved today so I recommend that we continue to diurese her over the weekend while her INR trends back upward and then once INR therapeutic do a TEE cardioversion.  She does not tolerate being in afib so I would like to get her back in NSR as soon as possible.   2.  Continue Lasix 40mg  IV BID 3.  Replete potassium  Quintella Reichert, MD  07/18/2012  8:07 AM

## 2012-07-18 NOTE — Progress Notes (Signed)
Utilization Review Completed.Shona Pardo T4/02/2013  

## 2012-07-18 NOTE — Preoperative (Signed)
Beta Blockers   Reason not to administer Beta Blockers:Not Applicable 

## 2012-07-18 NOTE — Progress Notes (Signed)
ANTICOAGULATION CONSULT NOTE - Initial Consult  Pharmacy Consult for Coumadin Indication: atrial fibrillation  Allergies  Allergen Reactions  . Codeine Anaphylaxis    jerking  . Toprol Xl (Metoprolol Tartrate)     depression  . Dilantin (Phenytoin Sodium Extended) Rash    Patient Measurements: Height: 5\' 7"  (170.2 cm) Weight: 239 lb 6.4 oz (108.591 kg) IBW/kg (Calculated) : 61.6  Vital Signs: Temp: 97.5 F (36.4 C) (04/11 0507) Temp src: Oral (04/11 0507) BP: 105/71 mmHg (04/11 0507) Pulse Rate: 83 (04/11 0507)  Labs:  Recent Labs  07/17/12 1916 07/18/12 0520  HGB 12.3  --   HCT 35.7*  --   PLT 213  --   APTT 40*  --   LABPROT 21.7* 20.2*  INR 1.98* 1.79*  CREATININE 0.75 0.70    Estimated Creatinine Clearance: 86.6 ml/min (by C-G formula based on Cr of 0.7).   Medical History: Past Medical History  Diagnosis Date  . Obesity   . Hypertension   . Aortic stenosis   . Subdural hematoma     in setting of INR greater than 2.2  . Dyslipidemia   . Anxiety     Medications:  Scheduled:  . calcium-vitamin D  1 tablet Oral Daily  . cholecalciferol  1,000 Units Oral Daily  . [COMPLETED] coumadin book   Does not apply Once  . diltiazem  240 mg Oral Daily  . [COMPLETED] furosemide      . furosemide  40 mg Intravenous BID  . losartan  50 mg Oral Daily  . metoprolol tartrate  25 mg Oral BID  . potassium chloride SA      . [START ON 07/19/2012] potassium chloride  20 mEq Oral Daily  . [COMPLETED] potassium chloride  40 mEq Oral NOW  . [COMPLETED] potassium chloride  40 mEq Oral Once  . potassium chloride  40 mEq Oral Once  . sertraline  150 mg Oral QHS  . sodium chloride  3 mL Intravenous Q12H  . [START ON 07/19/2012] tretinoin  1 Applicatorful Topical QHS  . [COMPLETED] warfarin  5 mg Oral Once  . [COMPLETED] warfarin   Does not apply Once  . Warfarin - Pharmacist Dosing Inpatient   Does not apply q1800  . [DISCONTINUED] furosemide  40 mg Intravenous Daily   . [DISCONTINUED] furosemide  40 mg Intravenous Daily  . [DISCONTINUED] furosemide  40 mg Intravenous Q breakfast  . [DISCONTINUED] sertraline  100 mg Oral QHS  . [DISCONTINUED] sertraline  50 mg Oral Daily    Assessment: 67yo female with AFib and history of St.Jude AVR as well as history of intracranial bleed 6 weeks after valve replacement surgery.  Goal INR has therefore been adjusted down.  Her INR was elevated at 2.6 on Tues, 4/9 and she has not taken her Coumadin since then as a result.  Coumadin resumed inpt. INR today is 1.79  Goal of Therapy:  INR 1.9-2.1 per MD Monitor platelets by anticoagulation protocol: Yes   Plan:  1.  Coumadin 5mg  2.  Daily INR

## 2012-07-18 NOTE — Care Management Note (Unsigned)
    Page 1 of 1   07/18/2012     3:42:26 PM   CARE MANAGEMENT NOTE 07/18/2012  Patient:  [name], [name]   Account Number:  [id number]  Date Initiated:  07/18/2012  Documentation initiated by:  Randall Rampersad  Subjective/Objective Assessment:   PT ADM ON 07/17/12 WITH SOB, ORTHOPNEA, AND CHF.  PTA, PT INDEPENDENT, LIVES WITH SPOUSE.     Action/Plan:   WILL FOLLOW FOR HOME NEEDS AS PT PROGRESSES.  PT MAY BENEFIT FROM HHRN FOR CHF FOLLOW UP AT DC.   Anticipated DC Date:  07/21/2012   Anticipated DC Plan:  HOME W HOME HEALTH SERVICES      DC Planning Services  CM consult      Choice offered to / List presented to:             Status of service:  In process, will continue to follow Medicare Important Message given?   (If response is "NO", the following Medicare IM given date fields will be blank) Date Medicare IM given:   Date Additional Medicare IM given:    Discharge Disposition:    Per UR Regulation:  Reviewed for med. necessity/level of care/duration of stay  If discussed at Long Length of Stay Meetings, dates discussed:    Comments:

## 2012-07-19 DIAGNOSIS — Z7901 Long term (current) use of anticoagulants: Secondary | ICD-10-CM | POA: Diagnosis not present

## 2012-07-19 DIAGNOSIS — E876 Hypokalemia: Secondary | ICD-10-CM | POA: Diagnosis not present

## 2012-07-19 DIAGNOSIS — I5031 Acute diastolic (congestive) heart failure: Secondary | ICD-10-CM | POA: Diagnosis not present

## 2012-07-19 DIAGNOSIS — I10 Essential (primary) hypertension: Secondary | ICD-10-CM | POA: Diagnosis not present

## 2012-07-19 DIAGNOSIS — I509 Heart failure, unspecified: Secondary | ICD-10-CM | POA: Diagnosis not present

## 2012-07-19 DIAGNOSIS — I4891 Unspecified atrial fibrillation: Secondary | ICD-10-CM | POA: Diagnosis not present

## 2012-07-19 LAB — PROTIME-INR
INR: 1.78 — ABNORMAL HIGH (ref 0.00–1.49)
Prothrombin Time: 20.1 seconds — ABNORMAL HIGH (ref 11.6–15.2)

## 2012-07-19 LAB — BASIC METABOLIC PANEL
CO2: 30 mEq/L (ref 19–32)
Chloride: 102 mEq/L (ref 96–112)
GFR calc Af Amer: >90 mL/min (ref >90)
Potassium: 3.7 mEq/L (ref 3.5–5.1)

## 2012-07-19 MED ORDER — WARFARIN SODIUM 6 MG PO TABS
6.0000 mg | ORAL_TABLET | Freq: Once | ORAL | Status: AC
  Administered 2012-07-19: 6 mg via ORAL
  Filled 2012-07-19: qty 1

## 2012-07-19 NOTE — Progress Notes (Signed)
Subjective:  Feels much better after diuresis. She is breathing better. She is no longer having paroxysmal nocturnal dyspnea. No chest pain.  Objective:  Vital Signs in the last 24 hours: Temp:  [97.4 F (36.3 C)-99.1 F (37.3 C)] 97.4 F (36.3 C) (04/12 0602) Pulse Rate:  [61-104] 86 (04/12 0602) Resp:  [18] 18 (04/12 0602) BP: (101-121)/(64-74) 110/64 mmHg (04/12 0602) SpO2:  [95 %-96 %] 96 % (04/12 0602) Weight:  [106.5 kg (234 lb 12.6 oz)] 106.5 kg (234 lb 12.6 oz) (04/12 0602)  Intake/Output from previous day: 04/11 0701 - 04/12 0700 In: 243 [P.O.:240; I.V.:3] Out: 1800 [Urine:1800]   Physical Exam: General: Well developed, well nourished, in no acute distress. Head:  Normocephalic and atraumatic. Lungs: Clear to auscultation and percussion. Heart: Irregularly irregular  No murmur, rubs or gallops.  Abdomen: soft, non-tender, positive bowel sounds. Overweight Extremities: No clubbing or cyanosis. Trace edema. Neurologic: Alert and oriented x 3.    Lab Results:  Recent Labs  07/17/12 1916  WBC 7.5  HGB 12.3  PLT 213    Recent Labs  07/18/12 0520 07/19/12 0500  NA 140 141  K 3.4* 3.7  CL 104 102  CO2 29 30  GLUCOSE 109* 111*  BUN 15 20  CREATININE 0.70 0.74   Hepatic Function Panel  Recent Labs  07/17/12 1916  PROT 6.8  ALBUMIN 3.5  AST 47*  ALT 58*  ALKPHOS 92  BILITOT 0.3   Imaging: Dg Chest 2 View  07/18/2012  *RADIOLOGY REPORT*  Clinical Data: Congestive heart failure.  CHEST - 2 VIEW  Comparison: 08/17/2004.  Findings: Trachea is midline.  Heart is at the upper limits of normal in size, stable.  Minimal linear opacification at the left lung base.  Lungs are otherwise clear.  No pleural fluid.  IMPRESSION: Left basilar subsegmental atelectasis.   Original Report Authenticated By: Leanna Battles, M.D.    Personally viewed.   Telemetry: Atrial fibrillation, rate controlled Personally viewed.   Assessment/Plan:  Active Problems:   Acute  diastolic CHF (congestive heart failure)   Atrial fibrillation   Essential hypertension   Chronic anticoagulation   Hypokalemia  67 year old with atrial fibrillation on chronic anticoagulation with prior intracranial hemorrhage following mechanical aortic valve replacement in 2006 with maintenance Coumadin level/INR trying to approach 2.0 here with acute diastolic heart failure, new onset atrial fibrillation.  1. Atrial fibrillation-currently well rate controlled. Telemetry reviewed. No changes made in medications.  2. Acute diastolic heart failure-IV Lasix 80 mg twice a day. Approximately negative 1.5 L net yesterday. She feels much better. Continue with diuresis once again. Potassium 3.7 this morning. Creatinine 0.74.  3. Chronic anticoagulation-unfortunately, her INR is less than 2. INR this morning is 1.78, yesterday 1.79, evening before 1.98. Pharmacy is adjusting.     [name] 07/19/2012, 9:36 AM

## 2012-07-19 NOTE — Progress Notes (Signed)
ANTICOAGULATION CONSULT NOTE - Initial Consult  Pharmacy Consult for Coumadin Indication: atrial fibrillation  Allergies  Allergen Reactions  . Codeine Anaphylaxis    jerking  . Toprol Xl (Metoprolol Tartrate)     depression  . Dilantin (Phenytoin Sodium Extended) Rash    Patient Measurements: Height: 5\' 7"  (170.2 cm) Weight: 234 lb 12.6 oz (106.5 kg) IBW/kg (Calculated) : 61.6  Vital Signs: Temp: 97.4 F (36.3 C) (04/12 0602) Temp src: Oral (04/12 0602) BP: 110/64 mmHg (04/12 0602) Pulse Rate: 86 (04/12 0602)  Labs:  Recent Labs  07/17/12 1916 07/18/12 0520 07/19/12 0500  HGB 12.3  --   --   HCT 35.7*  --   --   PLT 213  --   --   APTT 40*  --   --   LABPROT 21.7* 20.2* 20.1*  INR 1.98* 1.79* 1.78*  CREATININE 0.75 0.70 0.74    Estimated Creatinine Clearance: 85.7 ml/min (by C-G formula based on Cr of 0.74).   Medical History: Past Medical History  Diagnosis Date  . Obesity   . Hypertension   . Aortic stenosis   . Subdural hematoma     in setting of INR greater than 2.2  . Dyslipidemia   . Anxiety     Medications:  Scheduled:  . calcium-vitamin D  1 tablet Oral Daily  . cholecalciferol  1,000 Units Oral Daily  . diltiazem  240 mg Oral Daily  . furosemide  80 mg Intravenous BID  . losartan  50 mg Oral Daily  . metoprolol tartrate  25 mg Oral BID  . potassium chloride  20 mEq Oral Daily  . sertraline  150 mg Oral QHS  . sodium chloride  3 mL Intravenous Q12H  . tretinoin  1 Applicatorful Topical QHS  . [COMPLETED] warfarin  5 mg Oral ONCE-1800  . Warfarin - Pharmacist Dosing Inpatient   Does not apply q1800  . [DISCONTINUED] furosemide  40 mg Intravenous BID    Assessment: 67yo female with AFib and history of St.Jude AVR as well as history of intracranial bleed 6 weeks after valve replacement surgery.  Goal INR has therefore been adjusted down.  Her INR was elevated at 2.6 on Tues, 4/9 and she has not taken her Coumadin since then as a result.   Coumadin resumed inpt. INR today is 1.78  Goal of Therapy:  INR 1.9-2.1 per MD Monitor platelets by anticoagulation protocol: Yes   Plan:  1.  Coumadin 6mg  PO x1 today 2.  Daily INR

## 2012-07-20 DIAGNOSIS — I509 Heart failure, unspecified: Secondary | ICD-10-CM | POA: Diagnosis not present

## 2012-07-20 DIAGNOSIS — I10 Essential (primary) hypertension: Secondary | ICD-10-CM | POA: Diagnosis not present

## 2012-07-20 DIAGNOSIS — Z7901 Long term (current) use of anticoagulants: Secondary | ICD-10-CM | POA: Diagnosis not present

## 2012-07-20 DIAGNOSIS — I4891 Unspecified atrial fibrillation: Secondary | ICD-10-CM | POA: Diagnosis not present

## 2012-07-20 DIAGNOSIS — I5031 Acute diastolic (congestive) heart failure: Secondary | ICD-10-CM | POA: Diagnosis not present

## 2012-07-20 DIAGNOSIS — E876 Hypokalemia: Secondary | ICD-10-CM | POA: Diagnosis not present

## 2012-07-20 LAB — BASIC METABOLIC PANEL
Chloride: 100 mEq/L (ref 96–112)
GFR calc Af Amer: >90 mL/min (ref >90)
GFR calc non Af Amer: 87 mL/min — ABNORMAL LOW (ref >90)
Potassium: 3.5 mEq/L (ref 3.5–5.1)
Sodium: 142 mEq/L (ref 135–145)

## 2012-07-20 LAB — PROTIME-INR
INR: 2.01 — ABNORMAL HIGH (ref 0.00–1.49)
Prothrombin Time: 22 seconds — ABNORMAL HIGH (ref 11.6–15.2)

## 2012-07-20 MED ORDER — WARFARIN SODIUM 5 MG PO TABS
5.0000 mg | ORAL_TABLET | Freq: Every day | ORAL | Status: AC
  Administered 2012-07-20: 5 mg via ORAL
  Filled 2012-07-20 (×3): qty 1

## 2012-07-20 MED ORDER — OFF THE BEAT BOOK
Freq: Once | Status: AC
  Administered 2012-07-19: 1
  Filled 2012-07-20: qty 1

## 2012-07-20 NOTE — Progress Notes (Signed)
Patient given Off the Beat book for Atrial Fibrillation patient education. Will continue to monitor.

## 2012-07-20 NOTE — Progress Notes (Signed)
ANTICOAGULATION CONSULT NOTE - Initial Consult  Pharmacy Consult for Coumadin Indication: atrial fibrillation  Allergies  Allergen Reactions  . Codeine Anaphylaxis    jerking  . Toprol Xl (Metoprolol Tartrate)     depression  . Dilantin (Phenytoin Sodium Extended) Rash    Patient Measurements: Height: 5\' 7"  (170.2 cm) Weight: 233 lb 11.2 oz (106.006 kg) IBW/kg (Calculated) : 61.6  Vital Signs: Temp: 98.2 F (36.8 C) (04/13 0451) Temp src: Oral (04/13 0451) BP: 106/64 mmHg (04/13 0451) Pulse Rate: 57 (04/13 0451)  Labs:  Recent Labs  07/17/12 1916 07/18/12 0520 07/19/12 0500 07/20/12 0711  HGB 12.3  --   --   --   HCT 35.7*  --   --   --   PLT 213  --   --   --   APTT 40*  --   --   --   LABPROT 21.7* 20.2* 20.1* 22.0*  INR 1.98* 1.79* 1.78* 2.01*  CREATININE 0.75 0.70 0.74 0.72    Estimated Creatinine Clearance: 85.5 ml/min (by C-G formula based on Cr of 0.72).   Medical History: Past Medical History  Diagnosis Date  . Obesity   . Hypertension   . Aortic stenosis   . Subdural hematoma     in setting of INR greater than 2.2  . Dyslipidemia   . Anxiety     Medications:  Scheduled:  . calcium-vitamin D  1 tablet Oral Daily  . cholecalciferol  1,000 Units Oral Daily  . diltiazem  240 mg Oral Daily  . furosemide  80 mg Intravenous BID  . losartan  50 mg Oral Daily  . metoprolol tartrate  25 mg Oral BID  . off the beat book   Does not apply Once  . potassium chloride  20 mEq Oral Daily  . sertraline  150 mg Oral QHS  . sodium chloride  3 mL Intravenous Q12H  . tretinoin  1 Applicatorful Topical QHS  . [COMPLETED] warfarin  6 mg Oral ONCE-1800  . Warfarin - Pharmacist Dosing Inpatient   Does not apply q1800    Assessment: 67yo female with AFib and history of St.Jude AVR as well as history of intracranial bleed 6 weeks after valve replacement surgery.  Goal INR has therefore been adjusted down.  Her INR was elevated at 2.6 on Tues, 4/9 and she has  not taken her Coumadin since then as a result.  Coumadin resumed inpt. INR today is 2  Goal of Therapy:  INR 1.9-2.1 per MD Monitor platelets by anticoagulation protocol: Yes   Plan:  1.  Coumadin 5mg  PO qday 2.  Daily INR

## 2012-07-20 NOTE — Progress Notes (Addendum)
Subjective:  Felt fairly well yesterday. Ambulating the hallway and then she began to feel acutely short of breath. Somewhat discouraged by this. Good overall diuresis. Continuing.  Objective:  Vital Signs in the last 24 hours: Temp:  [98 F (36.7 C)-98.2 F (36.8 C)] 98.2 F (36.8 C) (04/13 0451) Pulse Rate:  [57-77] 57 (04/13 0451) Resp:  [16-18] 18 (04/13 0451) BP: (96-114)/(50-68) 106/64 mmHg (04/13 0451) SpO2:  [95 %-99 %] 99 % (04/13 0451) Weight:  [106.006 kg (233 lb 11.2 oz)] 106.006 kg (233 lb 11.2 oz) (04/13 0451)  Intake/Output from previous day: 04/12 0701 - 04/13 0700 In: 720 [P.O.:720] Out: 1900 [Urine:1900]   Physical Exam: General: Well developed, well nourished, in no acute distress. Head:  Normocephalic and atraumatic. Lungs: Clear to auscultation and percussion. Heart: Irregularly irregular  sharp S2 click No murmur, rubs or gallops.  Abdomen: soft, non-tender, positive bowel sounds. Obese Extremities: No clubbing or cyanosis.  Trace edema. Neurologic: Alert and oriented x 3.    Lab Results:  Recent Labs  07/17/12 1916  WBC 7.5  HGB 12.3  PLT 213    Recent Labs  07/18/12 0520 07/19/12 0500  NA 140 141  K 3.4* 3.7  CL 104 102  CO2 29 30  GLUCOSE 109* 111*  BUN 15 20  CREATININE 0.70 0.74   Hepatic Function Panel  Recent Labs  07/17/12 1916  PROT 6.8  ALBUMIN 3.5  AST 47*  ALT 58*  ALKPHOS 92  BILITOT 0.3   Telemetry: Continued atrial fibrillation Personally viewed.   Assessment/Plan:  Active Problems:   Acute diastolic CHF (congestive heart failure)   Atrial fibrillation   Essential hypertension   Chronic anticoagulation   Hypokalemia  67 year old with atrial fibrillation on chronic anticoagulation with prior intracranial hemorrhage following mechanical aortic valve replacement in 2006 with maintenance Coumadin level/INR trying to approach 2.0 here with acute diastolic heart failure, new onset atrial fibrillation.   1.  Atrial fibrillation-currently well rate controlled. Telemetry reviewed. No changes made in medications. She was discouraged by symptoms of dyspnea on exertion ring ambulation. This may be multifactorial including diastolic heart failure. Continuing with diuresis. I discussed with her the possibility once again of performing transesophageal echocardiogram with cardioversion likely on Tuesday.   2. Acute diastolic heart failure-IV Lasix 80 mg twice a day. Approximately negative 1.5 L net yesterday.  Continue with diuresis once again. Potassium 3.7 this morning. Creatinine 0.74. Checking basic metabolic profile.  3. Chronic anticoagulation- INR is now 2. Previously 1.78, yesterday 1.79, evening before 1.98. Pharmacy is adjusting.      SKAINS, MARK 07/20/2012, 9:13 AM

## 2012-07-21 DIAGNOSIS — I4891 Unspecified atrial fibrillation: Secondary | ICD-10-CM | POA: Diagnosis not present

## 2012-07-21 DIAGNOSIS — E876 Hypokalemia: Secondary | ICD-10-CM | POA: Diagnosis not present

## 2012-07-21 DIAGNOSIS — Z7901 Long term (current) use of anticoagulants: Secondary | ICD-10-CM | POA: Diagnosis not present

## 2012-07-21 DIAGNOSIS — I509 Heart failure, unspecified: Secondary | ICD-10-CM | POA: Diagnosis not present

## 2012-07-21 DIAGNOSIS — I10 Essential (primary) hypertension: Secondary | ICD-10-CM | POA: Diagnosis not present

## 2012-07-21 DIAGNOSIS — I5031 Acute diastolic (congestive) heart failure: Secondary | ICD-10-CM | POA: Diagnosis not present

## 2012-07-21 LAB — BASIC METABOLIC PANEL
BUN: 23 mg/dL (ref 6–23)
Calcium: 8.9 mg/dL (ref 8.4–10.5)
Chloride: 99 mEq/L (ref 96–112)
Creatinine, Ser: 0.72 mg/dL (ref 0.50–1.10)
GFR calc Af Amer: >90 mL/min (ref >90)
GFR calc non Af Amer: 87 mL/min — ABNORMAL LOW (ref >90)

## 2012-07-21 LAB — PROTIME-INR
INR: 2.1 — ABNORMAL HIGH (ref 0.00–1.49)
Prothrombin Time: 22.7 seconds — ABNORMAL HIGH (ref 11.6–15.2)

## 2012-07-21 MED ORDER — SODIUM CHLORIDE 0.9 % IV SOLN
INTRAVENOUS | Status: DC
  Administered 2012-07-22: 500 mL via INTRAVENOUS

## 2012-07-21 MED ORDER — POTASSIUM CHLORIDE CRYS ER 20 MEQ PO TBCR
40.0000 meq | EXTENDED_RELEASE_TABLET | Freq: Two times a day (BID) | ORAL | Status: AC
  Administered 2012-07-21 (×2): 40 meq via ORAL
  Filled 2012-07-21: qty 2

## 2012-07-21 MED ORDER — WARFARIN SODIUM 5 MG PO TABS
5.0000 mg | ORAL_TABLET | Freq: Every day | ORAL | Status: DC
  Administered 2012-07-21 – 2012-07-22 (×2): 5 mg via ORAL
  Filled 2012-07-21 (×2): qty 1

## 2012-07-21 NOTE — Progress Notes (Signed)
Subjective:  Feels better. SOB decreased.  Objective:  Vital Signs in the last 24 hours: Temp:  [97.7 F (36.5 C)-98.6 F (37 C)] 97.7 F (36.5 C) (04/14 0437) Pulse Rate:  [69-89] 89 (04/14 0437) Resp:  [16-18] 18 (04/14 0437) BP: (105-111)/(62-71) 108/71 mmHg (04/14 0437) SpO2:  [93 %-95 %] 93 % (04/14 0437) Weight:  [106.187 kg (234 lb 1.6 oz)] 106.187 kg (234 lb 1.6 oz) (04/14 0437)  Intake/Output from previous day: 04/13 0701 - 04/14 0700 In: 726 [P.O.:720; I.V.:6] Out: 2500 [Urine:2500]   Physical Exam: General: Well developed, well nourished, in no acute distress.  Head: Normocephalic and atraumatic.  Lungs: Clear to auscultation and percussion.  Heart: Irregularly irregular sharp S2 click No murmur, rubs or gallops.  Abdomen: soft, non-tender, positive bowel sounds. Obese  Extremities: No clubbing or cyanosis. Trace edema.  Neurologic: Alert and oriented x 3.     Lab Results: No results found for this basename: WBC, HGB, PLT,  in the last 72 hours  Recent Labs  07/20/12 0711 07/21/12 0530  NA 142 141  K 3.5 3.3*  CL 100 99  CO2 32 32  GLUCOSE 104* 109*  BUN 18 23  CREATININE 0.72 0.72   Telemetry: AFIB Personally viewed.   Assessment/Plan:     67 year old with atrial fibrillation on chronic anticoagulation with prior intracranial hemorrhage following mechanical aortic valve replacement in 2006 with maintenance Coumadin level/INR trying to approach 2.0 here with acute diastolic heart failure, new onset atrial fibrillation.   1. Atrial fibrillation-currently well rate controlled. Telemetry reviewed. No changes made in medications. Continuing with diuresis. I schedule transesophageal echocardiogram with cardioversion on Tuesday.   2. Acute diastolic heart failure-IV Lasix 80 mg twice a day. Approximately negative 3.6L total. Continue with diuresis once again. Potassium 3.3 this morning. Creatinine 0.74. Checking basic metabolic profile. Replete.  3.  Chronic anticoagulation- INR is now 2.1 Pharmacy is adjusting.        [name] 07/21/2012, 8:12 AM

## 2012-07-21 NOTE — Progress Notes (Signed)
ANTICOAGULATION CONSULT NOTE - Initial Consult  Pharmacy Consult for Coumadin Indication: atrial fibrillation  Allergies  Allergen Reactions  . Codeine Anaphylaxis    jerking  . Toprol Xl (Metoprolol Tartrate)     depression  . Dilantin (Phenytoin Sodium Extended) Rash    Patient Measurements: Height: 5\' 7"  (170.2 cm) Weight: 234 lb 1.6 oz (106.187 kg) IBW/kg (Calculated) : 61.6 kg  Vital Signs: Temp: 97.7 F (36.5 C) (04/14 0437) Temp src: Oral (04/14 0437) BP: 108/71 mmHg (04/14 0437) Pulse Rate: 89 (04/14 0437)  Labs:  Recent Labs  07/19/12 0500 07/20/12 0711 07/21/12 0530  LABPROT 20.1* 22.0* 22.7*  INR 1.78* 2.01* 2.10*  CREATININE 0.74 0.72 0.72    Estimated Creatinine Clearance: 85.5 ml/min (by C-G formula based on Cr of 0.72).   Medical History: Past Medical History  Diagnosis Date  . Obesity   . Hypertension   . Aortic stenosis   . Subdural hematoma     in setting of INR greater than 2.2  . Dyslipidemia   . Anxiety     Medications:  Scheduled:  . calcium-vitamin D  1 tablet Oral Daily  . cholecalciferol  1,000 Units Oral Daily  . diltiazem  240 mg Oral Daily  . furosemide  80 mg Intravenous BID  . losartan  50 mg Oral Daily  . metoprolol tartrate  25 mg Oral BID  . potassium chloride  20 mEq Oral Daily  . potassium chloride  40 mEq Oral BID  . sertraline  150 mg Oral QHS  . sodium chloride  3 mL Intravenous Q12H  . [COMPLETED] warfarin  5 mg Oral q1800  . Warfarin - Pharmacist Dosing Inpatient   Does not apply q1800  . [DISCONTINUED] tretinoin  1 Applicatorful Topical QHS    Assessment: 67yo female with AFib and history of St.Jude AVR as well as history of intracranial bleed 6 weeks after valve replacement surgery.  Goal INR has therefore been adjusted to the narrow goal of 1.9-2.1.  INR today is 2.1.  Goal of Therapy:  INR 1.9-2.1 per MD Monitor platelets by anticoagulation protocol: Yes   Plan:  1.  Continue Coumadin 5mg  PO  daily 2.  Daily INR   [employer], Pharm.D., BCPS Clinical Pharmacist Pager [phone] 07/21/2012 9:27 AM

## 2012-07-22 ENCOUNTER — Encounter (HOSPITAL_COMMUNITY): Admission: RE | Payer: Self-pay | Source: Ambulatory Visit

## 2012-07-22 ENCOUNTER — Encounter (HOSPITAL_COMMUNITY): Payer: Self-pay | Admitting: Certified Registered"

## 2012-07-22 ENCOUNTER — Encounter (HOSPITAL_COMMUNITY): Payer: Self-pay

## 2012-07-22 ENCOUNTER — Encounter (HOSPITAL_COMMUNITY)
Admission: AD | Disposition: A | Discharge status: Home / Self Care | Payer: Self-pay | Source: Ambulatory Visit | Attending: Cardiology

## 2012-07-22 ENCOUNTER — Ambulatory Visit (HOSPITAL_COMMUNITY): Admission: RE | Admit: 2012-07-22 | Payer: Medicare Other | Source: Ambulatory Visit | Admitting: Cardiology

## 2012-07-22 DIAGNOSIS — I5031 Acute diastolic (congestive) heart failure: Secondary | ICD-10-CM | POA: Diagnosis not present

## 2012-07-22 DIAGNOSIS — I10 Essential (primary) hypertension: Secondary | ICD-10-CM | POA: Diagnosis not present

## 2012-07-22 DIAGNOSIS — Z7901 Long term (current) use of anticoagulants: Secondary | ICD-10-CM | POA: Diagnosis not present

## 2012-07-22 DIAGNOSIS — E876 Hypokalemia: Secondary | ICD-10-CM | POA: Diagnosis not present

## 2012-07-22 DIAGNOSIS — I4891 Unspecified atrial fibrillation: Secondary | ICD-10-CM | POA: Diagnosis not present

## 2012-07-22 DIAGNOSIS — I110 Hypertensive heart disease with heart failure: Secondary | ICD-10-CM | POA: Diagnosis not present

## 2012-07-22 DIAGNOSIS — I509 Heart failure, unspecified: Secondary | ICD-10-CM | POA: Diagnosis not present

## 2012-07-22 HISTORY — PX: CARDIOVERSION: SHX1299

## 2012-07-22 HISTORY — PX: TEE WITHOUT CARDIOVERSION: SHX5443

## 2012-07-22 LAB — BASIC METABOLIC PANEL
BUN: 21 mg/dL (ref 6–23)
CO2: 30 mEq/L (ref 19–32)
Chloride: 99 mEq/L (ref 96–112)
Creatinine, Ser: 0.69 mg/dL (ref 0.50–1.10)

## 2012-07-22 LAB — PROTIME-INR: Prothrombin Time: 22.9 seconds — ABNORMAL HIGH (ref 11.6–15.2)

## 2012-07-22 SURGERY — ECHOCARDIOGRAM, TRANSESOPHAGEAL
Anesthesia: Moderate Sedation

## 2012-07-22 SURGERY — CARDIOVERSION
Anesthesia: Monitor Anesthesia Care

## 2012-07-22 MED ORDER — POTASSIUM CHLORIDE CRYS ER 20 MEQ PO TBCR: 20.0000 meq | EXTENDED_RELEASE_TABLET | Freq: Every day | ORAL | Status: DC

## 2012-07-22 MED ORDER — MIDAZOLAM HCL 5 MG/ML IJ SOLN
INTRAMUSCULAR | Status: AC
  Filled 2012-07-22: qty 2

## 2012-07-22 MED ORDER — MIDAZOLAM HCL 10 MG/2ML IJ SOLN
INTRAMUSCULAR | Status: DC | PRN
  Administered 2012-07-22 (×2): 2 mg via INTRAVENOUS

## 2012-07-22 MED ORDER — FENTANYL CITRATE 0.05 MG/ML IJ SOLN
INTRAMUSCULAR | Status: AC
  Filled 2012-07-22: qty 2

## 2012-07-22 MED ORDER — FUROSEMIDE 80 MG PO TABS
80.0000 mg | ORAL_TABLET | Freq: Every day | ORAL | Status: DC
  Filled 2012-07-22: qty 1

## 2012-07-22 MED ORDER — LIDOCAINE VISCOUS 2 % MT SOLN
OROMUCOSAL | Status: AC
  Filled 2012-07-22: qty 15

## 2012-07-22 MED ORDER — FUROSEMIDE 80 MG PO TABS: 80.0000 mg | ORAL_TABLET | Freq: Every day | ORAL | Status: DC

## 2012-07-22 MED ORDER — FENTANYL CITRATE 0.05 MG/ML IJ SOLN
INTRAMUSCULAR | Status: DC | PRN
  Administered 2012-07-22 (×2): 25 ug via INTRAVENOUS

## 2012-07-22 MED ORDER — BUTAMBEN-TETRACAINE-BENZOCAINE 2-2-14 % EX AERO
INHALATION_SPRAY | CUTANEOUS | Status: DC | PRN
  Administered 2012-07-22: 2 via TOPICAL

## 2012-07-22 NOTE — CV Procedure (Signed)
Electrical Cardioversion Procedure Note [name] [id number] 1946-03-11  Procedure: Electrical Cardioversion Indications:  Atrial Fibrillation  Time Out: Verified patient identification, verified procedure,medications/allergies/relevent history reviewed, required imaging and test results available.  {time out performed  Procedure Details  The patient was NPO after midnight. Cardioversion was performed with synchronized biphasic defibrillation via AP pads with 150 joules.  1 attempt(s) were performed.  The patient converted to normal sinus rhythm. The patient tolerated the procedure well   IMPRESSION:  Successful cardioversion of atrial fibrillation TEE Mechanical AV Normal EF No LAA or LA thrombus Mild MR.   Versed 5mg  and of Fentanyl.   [name] 07/22/2012, 2:08 PM

## 2012-07-22 NOTE — H&P (View-Only) (Signed)
Subjective:  No complaints, breathing better. NPO  Objective:  Vital Signs in the last 24 hours: Temp:  [97.4 F (36.3 C)-98.4 F (36.9 C)] 97.4 F (36.3 C) (04/15 0519) Pulse Rate:  [70-78] 75 (04/15 0519) Resp:  [18-19] 18 (04/15 0519) BP: (95-112)/(52-79) 105/79 mmHg (04/15 0519) SpO2:  [94 %-99 %] 94 % (04/15 0519) Weight:  [106.8 kg (235 lb 7.2 oz)] 106.8 kg (235 lb 7.2 oz) (04/15 0519)  Intake/Output from previous day: 04/14 0701 - 04/15 0700 In: 480 [P.O.:480] Out: 2000 [Urine:2000]   Physical Exam: General: Well developed, well nourished, in no acute distress. Head:  Normocephalic and atraumatic. Lungs: Clear to auscultation and percussion. Heart:Irreg irreg No murmur, rubs or gallops.  Abdomen: soft, non-tender, positive bowel sounds. Extremities: No clubbing or cyanosis. No edema. Neurologic: Alert and oriented x 3.    Lab Results: No results found for this basename: WBC, HGB, PLT,  in the last 72 hours  Recent Labs  07/21/12 0530 07/22/12 0440  NA 141 140  K 3.3* 3.9  CL 99 99  CO2 32 30  GLUCOSE 109* 105*  BUN 23 21  CREATININE 0.72 0.69   Assessment/Plan:   67-year-old with atrial fibrillation on chronic anticoagulation with prior intracranial hemorrhage following mechanical aortic valve replacement in 2006 with maintenance Coumadin level/INR trying to approach 2.0 here with acute diastolic heart failure, new onset atrial fibrillation.     1) Afib  - TEE/CV today  - INR 2.1  - Good rate control  2) Diastolic HF  - -5 L out   - improved  - Will change to PO lasix.   3) Anticoag  - coumadin (goal 2)  - years ago 2006 ICH  4) AVR  - mechanical  - sharp click.  Hopeful home this evening after conversion if stable.   [name] 07/22/2012, 10:13 AM     

## 2012-07-22 NOTE — Discharge Summary (Signed)
Patient ID: [name] MRN: [id number] DOB/AGE: [birth year] 67 y.o.  Admit date: 07/17/2012 Discharge date: 07/22/2012  Primary Discharge Diagnosis: AFIB  Secondary Discharge Diagnosis" Diastolic HF, HTN  Significant Diagnostic Studies: TEE/CV: Normal EF, no LA thrombus, mild MR, mechanical AV    Hospital Course: 67 year old admitted with increased DOE, new discovered AFIB, mechanical AV with prior ICH. Diuresed aggressively over weekend. Net out about 5 liters. Felt much better from breathing perspective. Remained in AFIB. INR 1.7 then up to 2.1 on discharge. TEE/CV done on day of DC. Successful. Felt well. SB 50-60. Lasix 80mg  PO QD.   She understands to watch fluid intake and salt. Earger for discharge.    Discharge Exam: Blood pressure 112/68, pulse 56, temperature 97.8 F (36.6 C), temperature source Oral, resp. rate 13, height 5\' 7"  (1.702 m), weight 106.8 kg (235 lb 7.2 oz), SpO2 95.00%.    GEN: AAO x 3 in NAD CV: Brady RR, sharp S2 click Lungs: CTAB ABD: soft, NT, +BS EXT: no edema   Labs:   Lab Results  Component Value Date   WBC 7.5 07/17/2012   HGB 12.3 07/17/2012   HCT 35.7* 07/17/2012   MCV 85.6 07/17/2012   PLT 213 07/17/2012    Recent Labs Lab 07/17/12 1916  07/22/12 0440  NA 141  < > 140  K 3.2*  < > 3.9  CL 102  < > 99  CO2 27  < > 30  BUN 15  < > 21  CREATININE 0.75  < > 0.69  CALCIUM 8.7  < > 8.8  PROT 6.8  --   --   BILITOT 0.3  --   --   ALKPHOS 92  --   --   ALT 58*  --   --   AST 47*  --   --   GLUCOSE 151*  < > 105*  < > = values in this interval not displayed.   FOLLOW UP PLANS AND APPOINTMENTS Discharge Orders   Future Orders Complete By Expires     Diet - low sodium heart healthy  As directed     Increase activity slowly  As directed         Medication List    TAKE these medications       calcium-vitamin D 500-200 MG-UNIT per tablet  Commonly known as:  OSCAL WITH D  Take 1 tablet by mouth daily.     cholecalciferol  1000 UNITS tablet  Commonly known as:  VITAMIN D  Take 1,000 Units by mouth daily.     diltiazem 240 MG 24 hr capsule  Commonly known as:  DILACOR XR  Take 240 mg by mouth daily.     furosemide 80 MG tablet  Commonly known as:  LASIX  Take 1 tablet (80 mg total) by mouth daily.     ibandronate 150 MG tablet  Commonly known as:  BONIVA  Take 150 mg by mouth every 30 (thirty) days. Take in the morning with a full glass of water, on an empty stomach, and do not take anything else by mouth or lie down for the next 30 min.     losartan 50 MG tablet  Commonly known as:  COZAAR  Take 50 mg by mouth daily.     metoprolol tartrate 25 MG tablet  Commonly known as:  LOPRESSOR  Take 25 mg by mouth 2 (two) times daily.     multivitamin with minerals Tabs  Take 1 tablet  by mouth daily.     potassium chloride SA 20 MEQ tablet  Commonly known as:  K-DUR,KLOR-CON  Take 1 tablet (20 mEq total) by mouth daily.     sertraline 100 MG tablet  Commonly known as:  ZOLOFT  Take 100 mg by mouth daily.     sertraline 50 MG tablet  Commonly known as:  ZOLOFT  Take 50 mg by mouth daily.     tretinoin 0.05 % cream  Commonly known as:  RETIN-A  Apply 1 Applicatorful topically at bedtime.     warfarin 5 MG tablet  Commonly known as:  COUMADIN  Take 5 mg by mouth daily. 6 mg on Monday, other days 5 mg     warfarin 1 MG tablet  Commonly known as:  COUMADIN  Take 1 mg by mouth every Monday. With 5 mg to make 6 mg dose     zolpidem 10 MG tablet  Commonly known as:  AMBIEN  Take 5 mg by mouth at bedtime as needed for sleep.           Follow-up Information   Follow up with FERGUSON,CYNTHIA A, NP On 07/29/2012. (10:10am, please check with Alfonse Ras, Pharm D about coumadin as well. )    Contact information:   [facility] [address], [address] [place] [address] [phone]       BRING ALL MEDICATIONS WITH YOU TO FOLLOW UP APPOINTMENTS  Time spent  with patient to include physician time:19min Signed: Donato Schultz 07/22/2012, 3:44 PM

## 2012-07-22 NOTE — Preoperative (Signed)
Beta Blockers   Reason not to administer Beta Blockers:Not Applicable 

## 2012-07-22 NOTE — Interval H&P Note (Signed)
History and Physical Interval Note:  07/22/2012 12:11 PM  [name]  has presented today for surgery, with the diagnosis of afib  The various methods of treatment have been discussed with the patient and family. After consideration of risks, benefits and other options for treatment, the patient has consented to  Procedure(s) with comments: TRANSESOPHAGEAL ECHOCARDIOGRAM (TEE) (N/A) - Rm 2034 CARDIOVERSION (N/A) as a surgical intervention .  The patient's history has been reviewed, patient examined, no change in status, stable for surgery.  I have reviewed the patient's chart and labs.  Questions were answered to the patient's satisfaction.     Maan Zarcone

## 2012-07-22 NOTE — Progress Notes (Signed)
ANTICOAGULATION CONSULT NOTE - Follow-up Consult  Pharmacy Consult for Coumadin Indication: atrial fibrillation  Allergies  Allergen Reactions  . Codeine Anaphylaxis    jerking  . Toprol Xl (Metoprolol Tartrate)     depression  . Dilantin (Phenytoin Sodium Extended) Rash    Patient Measurements: Height: 5\' 7"  (170.2 cm) Weight: 235 lb 7.2 oz (106.8 kg) IBW/kg (Calculated) : 61.6 kg  Vital Signs: Temp: 97.4 F (36.3 C) (04/15 0519) Temp src: Oral (04/15 0519) BP: 105/79 mmHg (04/15 0519) Pulse Rate: 75 (04/15 0519)  Labs:  Recent Labs  07/20/12 0711 07/21/12 0530 07/22/12 0440  LABPROT 22.0* 22.7* 22.9*  INR 2.01* 2.10* 2.13*  CREATININE 0.72 0.72 0.69    Estimated Creatinine Clearance: 85.9 ml/min (by C-G formula based on Cr of 0.69).   Medical History: Past Medical History  Diagnosis Date  . Obesity   . Hypertension   . Aortic stenosis   . Subdural hematoma     in setting of INR greater than 2.2  . Dyslipidemia   . Anxiety     Medications:  Scheduled:  . calcium-vitamin D  1 tablet Oral Daily  . cholecalciferol  1,000 Units Oral Daily  . diltiazem  240 mg Oral Daily  . furosemide  80 mg Intravenous BID  . losartan  50 mg Oral Daily  . metoprolol tartrate  25 mg Oral BID  . potassium chloride  20 mEq Oral Daily  . [COMPLETED] potassium chloride  40 mEq Oral BID  . sertraline  150 mg Oral QHS  . sodium chloride  3 mL Intravenous Q12H  . warfarin  5 mg Oral q1800  . Warfarin - Pharmacist Dosing Inpatient   Does not apply q1800    Assessment: 67yo female with AFib and history of St.Jude AVR (feb 2006) as well as history of intracranial bleed 6 weeks after valve replacement surgery (May 2006).  Goal INR has been adjusted to the narrow goal of 1.9-2.1.  Will attempt to keep within goal as much as possible although will be difficult.  INR today is 2.13, slow trend up.  Noted plans for TEE/DCCV today.  Goal of Therapy:  INR 1.9-2.1 per MD Monitor  platelets by anticoagulation protocol: Yes   Plan:  1.  Continue Coumadin 5mg  PO daily 2.  Daily INR   [employer], Pharm.D., BCPS Clinical Pharmacist Pager [phone] 07/22/2012 9:09 AM

## 2012-07-22 NOTE — Progress Notes (Signed)
Subjective:  No complaints, breathing better. NPO  Objective:  Vital Signs in the last 24 hours: Temp:  [97.4 F (36.3 C)-98.4 F (36.9 C)] 97.4 F (36.3 C) (04/15 0519) Pulse Rate:  [70-78] 75 (04/15 0519) Resp:  [18-19] 18 (04/15 0519) BP: (95-112)/(52-79) 105/79 mmHg (04/15 0519) SpO2:  [94 %-99 %] 94 % (04/15 0519) Weight:  [106.8 kg (235 lb 7.2 oz)] 106.8 kg (235 lb 7.2 oz) (04/15 0519)  Intake/Output from previous day: 04/14 0701 - 04/15 0700 In: 480 [P.O.:480] Out: 2000 [Urine:2000]   Physical Exam: General: Well developed, well nourished, in no acute distress. Head:  Normocephalic and atraumatic. Lungs: Clear to auscultation and percussion. Heart:Irreg irreg No murmur, rubs or gallops.  Abdomen: soft, non-tender, positive bowel sounds. Extremities: No clubbing or cyanosis. No edema. Neurologic: Alert and oriented x 3.    Lab Results: No results found for this basename: WBC, HGB, PLT,  in the last 72 hours  Recent Labs  07/21/12 0530 07/22/12 0440  NA 141 140  K 3.3* 3.9  CL 99 99  CO2 32 30  GLUCOSE 109* 105*  BUN 23 21  CREATININE 0.72 0.69   Assessment/Plan:   67 year old with atrial fibrillation on chronic anticoagulation with prior intracranial hemorrhage following mechanical aortic valve replacement in 2006 with maintenance Coumadin level/INR trying to approach 2.0 here with acute diastolic heart failure, new onset atrial fibrillation.     1) Afib  - TEE/CV today  - INR 2.1  - Good rate control  2) Diastolic HF  - -5 L out   - improved  - Will change to PO lasix.   3) Anticoag  - coumadin (goal 2)  - years ago 2006 ICH  4) AVR  - mechanical  - sharp click.  Hopeful home this evening after conversion if stable.   SKAINS, MARK 07/22/2012, 10:13 AM

## 2012-07-22 NOTE — Anesthesia Preprocedure Evaluation (Deleted)
Anesthesia Evaluation  Patient identified by MRN, date of birth, ID band Patient awake    Reviewed: Allergy & Precautions, H&P , NPO status , Patient's Chart, lab work & pertinent test results, reviewed documented beta blocker date and time   Airway Mallampati: II TM Distance: >3 FB     Dental  (+) Dental Advisory Given   Pulmonary neg pulmonary ROS,  breath sounds clear to auscultation        Cardiovascular hypertension, Pt. on medications and Pt. on home beta blockers +CHF + dysrhythmias Atrial Fibrillation + Valvular Problems/Murmurs (2006 AVR for AS.) AS Rhythm:Irregular Rate:Tachycardia     Neuro/Psych HIstory of Subdural hematoma evacuation    GI/Hepatic Neg liver ROS, GERD-  Controlled,  Endo/Other  negative endocrine ROS  Renal/GU negative Renal ROS     Musculoskeletal   Abdominal   Peds  Hematology negative hematology ROS (+)   Anesthesia Other Findings   Reproductive/Obstetrics                          Anesthesia Physical Anesthesia Plan  ASA: III  Anesthesia Plan: MAC   Post-op Pain Management:    Induction: Intravenous  Airway Management Planned: Simple Face Mask  Additional Equipment:   Intra-op Plan:   Post-operative Plan:   Informed Consent: I have reviewed the patients History and Physical, chart, labs and discussed the procedure including the risks, benefits and alternatives for the proposed anesthesia with the patient or authorized representative who has indicated his/her understanding and acceptance.   Dental advisory given  Plan Discussed with: CRNA, Anesthesiologist and Surgeon  Anesthesia Plan Comments:         Anesthesia Quick Evaluation

## 2012-07-22 NOTE — Progress Notes (Signed)
  Echocardiogram Echocardiogram Transesophageal has been performed.  [name] 07/22/2012, 3:21 PM

## 2012-07-23 ENCOUNTER — Encounter (HOSPITAL_COMMUNITY): Payer: Self-pay | Admitting: Cardiology

## 2012-07-24 DIAGNOSIS — M1710 Unilateral primary osteoarthritis, unspecified knee: Secondary | ICD-10-CM | POA: Diagnosis not present

## 2012-07-29 DIAGNOSIS — R0602 Shortness of breath: Secondary | ICD-10-CM | POA: Diagnosis not present

## 2012-07-29 DIAGNOSIS — Z79899 Other long term (current) drug therapy: Secondary | ICD-10-CM | POA: Diagnosis not present

## 2012-07-29 DIAGNOSIS — Z954 Presence of other heart-valve replacement: Secondary | ICD-10-CM | POA: Diagnosis not present

## 2012-07-29 DIAGNOSIS — I4891 Unspecified atrial fibrillation: Secondary | ICD-10-CM | POA: Diagnosis not present

## 2012-07-29 DIAGNOSIS — I10 Essential (primary) hypertension: Secondary | ICD-10-CM | POA: Diagnosis not present

## 2012-07-29 DIAGNOSIS — Z7901 Long term (current) use of anticoagulants: Secondary | ICD-10-CM | POA: Diagnosis not present

## 2012-07-31 DIAGNOSIS — M1710 Unilateral primary osteoarthritis, unspecified knee: Secondary | ICD-10-CM | POA: Diagnosis not present

## 2012-08-01 DIAGNOSIS — Z7901 Long term (current) use of anticoagulants: Secondary | ICD-10-CM | POA: Diagnosis not present

## 2012-08-01 DIAGNOSIS — I4891 Unspecified atrial fibrillation: Secondary | ICD-10-CM | POA: Diagnosis not present

## 2012-08-01 DIAGNOSIS — Z954 Presence of other heart-valve replacement: Secondary | ICD-10-CM | POA: Diagnosis not present

## 2012-08-05 DIAGNOSIS — I4891 Unspecified atrial fibrillation: Secondary | ICD-10-CM | POA: Diagnosis not present

## 2012-08-05 DIAGNOSIS — Z7901 Long term (current) use of anticoagulants: Secondary | ICD-10-CM | POA: Diagnosis not present

## 2012-08-07 DIAGNOSIS — M1710 Unilateral primary osteoarthritis, unspecified knee: Secondary | ICD-10-CM | POA: Diagnosis not present

## 2012-08-11 DIAGNOSIS — I4891 Unspecified atrial fibrillation: Secondary | ICD-10-CM | POA: Diagnosis not present

## 2012-08-11 DIAGNOSIS — I119 Hypertensive heart disease without heart failure: Secondary | ICD-10-CM | POA: Diagnosis not present

## 2012-08-11 DIAGNOSIS — I359 Nonrheumatic aortic valve disorder, unspecified: Secondary | ICD-10-CM | POA: Diagnosis not present

## 2012-08-11 DIAGNOSIS — Z7901 Long term (current) use of anticoagulants: Secondary | ICD-10-CM | POA: Diagnosis not present

## 2012-08-11 DIAGNOSIS — Z954 Presence of other heart-valve replacement: Secondary | ICD-10-CM | POA: Diagnosis not present

## 2012-08-14 DIAGNOSIS — I4891 Unspecified atrial fibrillation: Secondary | ICD-10-CM | POA: Diagnosis not present

## 2012-08-14 DIAGNOSIS — I119 Hypertensive heart disease without heart failure: Secondary | ICD-10-CM | POA: Diagnosis not present

## 2012-08-14 DIAGNOSIS — Z7901 Long term (current) use of anticoagulants: Secondary | ICD-10-CM | POA: Diagnosis not present

## 2012-08-14 DIAGNOSIS — I359 Nonrheumatic aortic valve disorder, unspecified: Secondary | ICD-10-CM | POA: Diagnosis not present

## 2012-08-18 DIAGNOSIS — M1710 Unilateral primary osteoarthritis, unspecified knee: Secondary | ICD-10-CM | POA: Diagnosis not present

## 2012-08-28 DIAGNOSIS — I359 Nonrheumatic aortic valve disorder, unspecified: Secondary | ICD-10-CM | POA: Diagnosis not present

## 2012-08-28 DIAGNOSIS — I4891 Unspecified atrial fibrillation: Secondary | ICD-10-CM | POA: Diagnosis not present

## 2012-08-28 DIAGNOSIS — Z7901 Long term (current) use of anticoagulants: Secondary | ICD-10-CM | POA: Diagnosis not present

## 2012-08-28 DIAGNOSIS — Z954 Presence of other heart-valve replacement: Secondary | ICD-10-CM | POA: Diagnosis not present

## 2012-08-28 DIAGNOSIS — I119 Hypertensive heart disease without heart failure: Secondary | ICD-10-CM | POA: Diagnosis not present

## 2012-08-29 ENCOUNTER — Inpatient Hospital Stay: Admit: 2012-08-29 | Payer: Self-pay | Admitting: Cardiology

## 2012-09-02 DIAGNOSIS — Z7901 Long term (current) use of anticoagulants: Secondary | ICD-10-CM | POA: Diagnosis not present

## 2012-09-02 DIAGNOSIS — Z954 Presence of other heart-valve replacement: Secondary | ICD-10-CM | POA: Diagnosis not present

## 2012-09-05 DIAGNOSIS — G4733 Obstructive sleep apnea (adult) (pediatric): Secondary | ICD-10-CM | POA: Diagnosis not present

## 2012-09-09 DIAGNOSIS — Z954 Presence of other heart-valve replacement: Secondary | ICD-10-CM | POA: Diagnosis not present

## 2012-09-09 DIAGNOSIS — Z7901 Long term (current) use of anticoagulants: Secondary | ICD-10-CM | POA: Diagnosis not present

## 2012-09-12 DIAGNOSIS — I10 Essential (primary) hypertension: Secondary | ICD-10-CM | POA: Diagnosis not present

## 2012-09-12 DIAGNOSIS — F329 Major depressive disorder, single episode, unspecified: Secondary | ICD-10-CM | POA: Diagnosis not present

## 2012-09-12 DIAGNOSIS — G4733 Obstructive sleep apnea (adult) (pediatric): Secondary | ICD-10-CM | POA: Diagnosis not present

## 2012-09-12 DIAGNOSIS — E7800 Pure hypercholesterolemia, unspecified: Secondary | ICD-10-CM | POA: Diagnosis not present

## 2012-09-12 DIAGNOSIS — Z79899 Other long term (current) drug therapy: Secondary | ICD-10-CM | POA: Diagnosis not present

## 2012-09-12 DIAGNOSIS — E669 Obesity, unspecified: Secondary | ICD-10-CM | POA: Diagnosis not present

## 2012-09-12 DIAGNOSIS — Z0000 Encounter for general adult medical examination without abnormal findings: Secondary | ICD-10-CM | POA: Diagnosis not present

## 2012-09-12 DIAGNOSIS — G479 Sleep disorder, unspecified: Secondary | ICD-10-CM | POA: Diagnosis not present

## 2012-09-16 ENCOUNTER — Ambulatory Visit: Payer: Self-pay | Admitting: Neurology

## 2012-09-16 DIAGNOSIS — Z7901 Long term (current) use of anticoagulants: Secondary | ICD-10-CM | POA: Diagnosis not present

## 2012-09-16 DIAGNOSIS — Z954 Presence of other heart-valve replacement: Secondary | ICD-10-CM | POA: Diagnosis not present

## 2012-09-17 DIAGNOSIS — M7120 Synovial cyst of popliteal space [Baker], unspecified knee: Secondary | ICD-10-CM | POA: Diagnosis not present

## 2012-09-17 DIAGNOSIS — M1710 Unilateral primary osteoarthritis, unspecified knee: Secondary | ICD-10-CM | POA: Diagnosis not present

## 2012-09-23 DIAGNOSIS — Z954 Presence of other heart-valve replacement: Secondary | ICD-10-CM | POA: Diagnosis not present

## 2012-09-23 DIAGNOSIS — Z7901 Long term (current) use of anticoagulants: Secondary | ICD-10-CM | POA: Diagnosis not present

## 2012-09-29 DIAGNOSIS — G4733 Obstructive sleep apnea (adult) (pediatric): Secondary | ICD-10-CM | POA: Diagnosis not present

## 2012-09-29 DIAGNOSIS — I509 Heart failure, unspecified: Secondary | ICD-10-CM | POA: Diagnosis not present

## 2012-10-02 ENCOUNTER — Other Ambulatory Visit: Payer: Self-pay | Admitting: Cardiology

## 2012-10-02 DIAGNOSIS — I359 Nonrheumatic aortic valve disorder, unspecified: Secondary | ICD-10-CM | POA: Diagnosis not present

## 2012-10-02 DIAGNOSIS — Z7901 Long term (current) use of anticoagulants: Secondary | ICD-10-CM | POA: Diagnosis not present

## 2012-10-02 DIAGNOSIS — I119 Hypertensive heart disease without heart failure: Secondary | ICD-10-CM | POA: Diagnosis not present

## 2012-10-02 DIAGNOSIS — G4733 Obstructive sleep apnea (adult) (pediatric): Secondary | ICD-10-CM | POA: Diagnosis not present

## 2012-10-02 DIAGNOSIS — I4891 Unspecified atrial fibrillation: Secondary | ICD-10-CM | POA: Diagnosis not present

## 2012-10-02 DIAGNOSIS — Z79899 Other long term (current) drug therapy: Secondary | ICD-10-CM | POA: Diagnosis not present

## 2012-10-02 NOTE — H&P (Addendum)
Office Visit     Patient: [name], [name] Account Number: [id number] Provider: Armanda Magic, MD  DOB: Aug 28, 1945   Age: 67 Y   Sex: Female Date: 10/02/2012  Phone: [phone]  Address: [address], [place], [id number]  Pcp: Carolin Coy    --------------------------------------------------------------------------------       1. 5WK FOLLOW UP.      HPI:     General:           The patient presents today for followup of her atrial fibrillation. She still has occasional SOB. She is feeling better. SHe denies any chest pain, LE edema, dizziness or syncope. She does not really notice her heart racing. She is waiting for her INR to be therapeutic for 4 weeks prior to admitting for Tikosyn loading. This is her 3rd week of having therapeutic INR's. It was 2.2 today. .      ROS:      See HPI, A twelve system review was perfomed at today's visit. For pertinent positives and negatives see HPI.     Medical History: Obesity, Hypertension, aortic stenosis status post aortic valve replacement with St. Jude mechanical prosthesis, prior left subdural hematoma requiring evacuation via twist drill craniotomy in May 2006 secondary to INR of 2.2., systemic anticoagulation with INR goal of 1.8-2.0 secondary history of intracranial bleed - on for her AVR, Beta blocker intolerance, Elevated cholesterol, 4/14 TEE cardioversion success but return to Atrial fibrillation on 07/29/12.      Surgical History: status post aortic valve replacement with 21 mm St. Jude Regent valve (Dr. Zenaida Niece Tright) 02/06, status post twist drill craniotomy for evacuation of left subdural hematoma 05/06, knee surgery 1980.      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 05/2004, cardioversion-afib 08/2012.      Family History:  Father: deceased 58 yrs, elevated cholesterol, hypertension, cancer of esophagusMother: deceased 69 yrs, lung cancer,  hypertensionBrother 1: alive 86 yrs, heart disease, emphysema, defibrilatorSister 1: alive 18 yrs, Fibromyalgia, hepatitiis in past neg. f. hx. colon/breast cancer Denies family history of colon polyps.     Social History:      General:          History of smoking  cigarettes: Never smoked.           no Smoking.          no Alcohol.          Caffeine: yes, coffee, 2 servings daily.          no Recreational drug use.          Exercise: yes, 4 x week, walks when able.          Occupation: [employer], retired.          Marital Status: married.          Children: 1, Boys.      Medications: Taking Centrum Silver Tablet 1 tablet once a day, Taking Oscal 500/200 D-3 500-200 MG-UNIT Tablet 1 tablets Once a day, Taking Tylenol Extra Strength 500 MG Tablet 2 tablets as needed, Taking Zoloft 100 Milligram Tablet TAKE 1 AND 1/2 TABLETS BY MOUTH ONCE DAILY , Taking Lasix 80 MG Tablet 1 tablet Once a dayand extra as directed, Taking Losartan Potassium 50.0 Milligram Tablet TAKE 1 TABLET BY MOUTH DAILY , Taking Diltiazem HCl ER Beads 300 MG Capsule Extended Release 24 Hour 1 capsule Once a day, Taking Coumadin 1 MG 1 MG Tablet 1  tablet to take with 5 MG warfarin when needed, Taking Ambien 10 MG Tablet 1/2-1 tablet at bedtime as needed, Taking Vitamin D 1000 UNIT Capsule 1 capsule Once a day, Taking Coumadin 5 MG Tablet 5 mg qd Once a day, Taking Klor-Con 10 10 MEQ Tablet Extended Release 1/2 tablet Once a day, Medication List reviewed and reconciled with the patient     Allergies: Codeine (for allergy): jerking: Allergy, Dilantin: rash: Allergy, Toprol XL: unknown: Side Effects, Beta blockers: depression.        Vitals: Wt 232.8, Wt change -5.2 lb, Ht 65, BMI 38.74, Pulse sitting 94, BP sitting 124/80.     Examination:     Cardiology, General:         GENERAL APPEARANCE: pleasant, NAD.         HEENT: unremarkable.         CAROTID UPSTROKE: normal, no bruit.         JVD: flat.         HEART SOUNDS:  regular, normal S1, S2, no S3 or S4.         MURMUR: absent.         LUNGS: no rales or wheezes.         ABDOMEN: soft, non tender, positive bowel sounds, no masses felt.         EXTREMITIES: no leg edema.         PERIPHERAL PULSES: 2 plus bilateral.               Assessment:  1. Atrial fibrillation - 427.31 (Primary)   2. Anticoagulant monitoring - V58.61   3. Benign hypertensive heart disease without heart failure - 402.10   4. Aortic valve disorders - 424.1   5. OSA (obstructive sleep apnea) - 327.23   6. Encounter for long-term (current) use of med (excludes anticoagulant, NSAIDS, steroids, aspirin, insulin) - V58.69       1. Atrial fibrillation  Continue Coumadin Tablet, 5 MG, 5 mg qd, Orally, Once a day ;  Continue Coumadin 1 MG Tablet, 1 MG, 1 tablet, Orally, to take with 5 MG warfarin when needed .    Notes: I will admit her 7/1 for drug load with Tikosyn as long as INR greater than or equal to 2 at that time.       2. Anticoagulant monitoring       LAB: PT/INR in Office Notes: I have instructed her to take Coumadin 2.5mg  tonight then resume 5mg  daily.       3. Benign hypertensive heart disease without heart failure  Continue Losartan Potassium Tablet, 50.0 Milligram, TAKE 1 TABLET BY MOUTH DAILY ;  Continue Diltiazem HCl ER Beads Capsule Extended Release 24 Hour, 300 MG, 1 capsule, Orally, Once a day .          Labs:        Lab: PT/INR in Office       Protime 26.3         INR 2.2  0.8 - 1.2                  resulted Howell,Kathleen 10/02/2012 10:42:34 AM >           Procedure Codes: [address]109 PROTHROMBIN TIME     Follow Up: Admit 7/1 for Tikosyn load           Provider: Armanda Magic, MD  Patient: [name], [name]  DOB: 1945/08/09  Date: 10/02/2012

## 2012-10-07 ENCOUNTER — Encounter (HOSPITAL_COMMUNITY): Payer: Self-pay | Admitting: General Practice

## 2012-10-07 ENCOUNTER — Inpatient Hospital Stay (HOSPITAL_COMMUNITY)
Admission: RE | Admit: 2012-10-07 | Discharge: 2012-10-07 | DRG: 309 | Disposition: A | Discharge status: Home / Self Care | Payer: Medicare Other | Source: Ambulatory Visit | Attending: Cardiology | Admitting: Cardiology

## 2012-10-07 DIAGNOSIS — E785 Hyperlipidemia, unspecified: Secondary | ICD-10-CM | POA: Diagnosis present

## 2012-10-07 DIAGNOSIS — E669 Obesity, unspecified: Secondary | ICD-10-CM | POA: Diagnosis present

## 2012-10-07 DIAGNOSIS — Z7901 Long term (current) use of anticoagulants: Secondary | ICD-10-CM | POA: Diagnosis not present

## 2012-10-07 DIAGNOSIS — E662 Morbid (severe) obesity with alveolar hypoventilation: Secondary | ICD-10-CM | POA: Diagnosis not present

## 2012-10-07 DIAGNOSIS — Z950 Presence of cardiac pacemaker: Secondary | ICD-10-CM

## 2012-10-07 DIAGNOSIS — Z8673 Personal history of transient ischemic attack (TIA), and cerebral infarction without residual deficits: Secondary | ICD-10-CM

## 2012-10-07 DIAGNOSIS — G4733 Obstructive sleep apnea (adult) (pediatric): Secondary | ICD-10-CM | POA: Diagnosis present

## 2012-10-07 DIAGNOSIS — I10 Essential (primary) hypertension: Secondary | ICD-10-CM

## 2012-10-07 DIAGNOSIS — I5032 Chronic diastolic (congestive) heart failure: Secondary | ICD-10-CM | POA: Diagnosis present

## 2012-10-07 DIAGNOSIS — I509 Heart failure, unspecified: Secondary | ICD-10-CM | POA: Diagnosis present

## 2012-10-07 DIAGNOSIS — Z79899 Other long term (current) drug therapy: Secondary | ICD-10-CM

## 2012-10-07 DIAGNOSIS — I4891 Unspecified atrial fibrillation: Principal | ICD-10-CM

## 2012-10-07 DIAGNOSIS — E876 Hypokalemia: Secondary | ICD-10-CM

## 2012-10-07 HISTORY — DX: Sleep apnea, unspecified: G47.30

## 2012-10-07 LAB — PROTIME-INR: Prothrombin Time: 20.1 seconds — ABNORMAL HIGH (ref 11.6–15.2)

## 2012-10-07 LAB — CBC WITH DIFFERENTIAL/PLATELET
Basophils Absolute: 0 10*3/uL (ref 0.0–0.1)
Eosinophils Relative: 5 % (ref 0–5)
HCT: 37.8 % (ref 36.0–46.0)
Lymphocytes Relative: 19 % (ref 12–46)
MCHC: 33.6 g/dL (ref 30.0–36.0)
MCV: 85.9 fL (ref 78.0–100.0)
Monocytes Absolute: 0.8 10*3/uL (ref 0.1–1.0)
RDW: 15.1 % (ref 11.5–15.5)

## 2012-10-07 LAB — COMPREHENSIVE METABOLIC PANEL
AST: 25 U/L (ref 0–37)
CO2: 29 mEq/L (ref 19–32)
Calcium: 9.7 mg/dL (ref 8.4–10.5)
Creatinine, Ser: 0.69 mg/dL (ref 0.50–1.10)
GFR calc Af Amer: >90 mL/min (ref >90)
GFR calc non Af Amer: 88 mL/min — ABNORMAL LOW (ref >90)

## 2012-10-07 LAB — PHOSPHORUS: Phosphorus: 4 mg/dL (ref 2.3–4.6)

## 2012-10-07 MED ORDER — SODIUM CHLORIDE 0.9 % IJ SOLN: 3.0000 mL | Freq: Two times a day (BID) | INTRAMUSCULAR | Status: DC

## 2012-10-07 MED ORDER — POTASSIUM CHLORIDE CRYS ER 20 MEQ PO TBCR: 20.0000 meq | EXTENDED_RELEASE_TABLET | Freq: Every day | ORAL | Status: DC

## 2012-10-07 MED ORDER — DILTIAZEM HCL ER 240 MG PO CP24: 240.0000 mg | ORAL_CAPSULE | Freq: Every day | ORAL | Status: DC

## 2012-10-07 MED ORDER — FUROSEMIDE 80 MG PO TABS: 80.0000 mg | ORAL_TABLET | Freq: Every day | ORAL | Status: DC

## 2012-10-07 MED ORDER — SODIUM CHLORIDE 0.9 % IV SOLN: 250.0000 mL | INTRAVENOUS | Status: DC | PRN

## 2012-10-07 MED ORDER — POTASSIUM CHLORIDE CRYS ER 20 MEQ PO TBCR: 20.0000 meq | EXTENDED_RELEASE_TABLET | Freq: Two times a day (BID) | ORAL | Status: DC

## 2012-10-07 MED ORDER — WARFARIN SODIUM 5 MG PO TABS: ORAL_TABLET | ORAL | Status: DC

## 2012-10-07 MED ORDER — SERTRALINE HCL 50 MG PO TABS: 50.0000 mg | ORAL_TABLET | Freq: Every day | ORAL | Status: DC

## 2012-10-07 MED ORDER — WARFARIN SODIUM 5 MG PO TABS
5.0000 mg | ORAL_TABLET | Freq: Every day | ORAL | Status: DC
  Filled 2012-10-07: qty 1

## 2012-10-07 MED ORDER — LOSARTAN POTASSIUM 50 MG PO TABS: 50.0000 mg | ORAL_TABLET | Freq: Every day | ORAL | Status: DC

## 2012-10-07 MED ORDER — SODIUM CHLORIDE 0.9 % IJ SOLN: 3.0000 mL | INTRAMUSCULAR | Status: DC | PRN

## 2012-10-07 MED ORDER — WARFARIN - PHYSICIAN DOSING INPATIENT: Freq: Every day | Status: DC

## 2012-10-07 MED ORDER — VITAMIN D3 25 MCG (1000 UNIT) PO TABS
1000.0000 [IU] | ORAL_TABLET | Freq: Every day | ORAL | Status: DC
  Filled 2012-10-07: qty 1

## 2012-10-07 MED ORDER — CALCIUM CARBONATE-VITAMIN D 500-200 MG-UNIT PO TABS
1.0000 | ORAL_TABLET | Freq: Every day | ORAL | Status: DC
  Filled 2012-10-07: qty 1

## 2012-10-07 MED ORDER — SERTRALINE HCL 100 MG PO TABS
150.0000 mg | ORAL_TABLET | Freq: Every day | ORAL | Status: DC
  Filled 2012-10-07: qty 1.5

## 2012-10-07 MED ORDER — ZOLPIDEM TARTRATE 5 MG PO TABS: 5.0000 mg | ORAL_TABLET | Freq: Every evening | ORAL | Status: DC | PRN

## 2012-10-07 NOTE — Progress Notes (Signed)
Discharged to home with family office visits in place teaching done  

## 2012-10-07 NOTE — Discharge Summary (Addendum)
Patient ID: [name] MRN: [id number] DOB/AGE: [birth year] 67 y.o.  Admit date: 10/07/2012 Discharge date: 10/07/2012  Primary Discharge Diagnosis atrial fibrillation Secondary Discharge Diagnosis  Obesity/hypoventilation syndrome  HTN  Aortic stenosis s/p mechanical AVR  Subdural hematoma s/p evacuation 2006  Dyslipidemia  OSA on CPAP  Chronic diastolic CHF   Consults: None  Hospital Course: This is a 67yo obese WF with a history of afib/HTN and AVR.  Afib is a fairly new diagnosis for her.  She has been fairly well rate controlled but has had some problems with acute diastolic CHF in the past and underwent TEE/DCCV which did not hold and she went back into afib.  Her coumadin has been adjusted but we have a narrow window to work with given her history of intracranial hemorrhage in the past on coumadin for AVR with INR of 2.3.  We have been trying to run her INR 2-2.2 but several times it has dropped below 2 when trying to cardiovert her.  She was admitted today to be started on Tikosyn but her INR is 1.77.  This is a difficult situation in that she is at increased risk of CVA with subtherapeutic INR especially in the setting of cardioversion but increasing her INR above to to ensure that she remains adequately anticoagulated has been difficult while trying to avoid overshooting her INR with resultant risk of bleed.  I have a call into Dr. Yetta Barre who treated her prior cerebral bleed to determine risk of repeat bleed if we try to run her INR around 2.5 for a brief period of time to get her cardioverted.  In the mean time she will be discharged to home to followup in our PT/INR clinic tomorrow for adjustment in Coumadin.  I have instructed her to take 6mg  of coumadin tonight and then resume prior dosing.  She was mildly hypokalemic on admission and her potassium was adjusted.   Discharge Exam: Blood pressure 100/70, pulse 87, temperature 98.1 F (36.7 C), temperature source Oral, resp. rate  18, height 5\' 7"  (1.702 m), weight 105.3 kg (232 lb 2.3 oz), SpO2 94.00%.   General appearance: alert Resp: clear to auscultation bilaterally Cardio:irregularly irregular rhythm, S1, S2 normal, no murmur, click, rub or gallop GI: soft, non-tender; bowel sounds normal; no masses,  no organomegaly Extremities: extremities normal, atraumatic, no cyanosis or edema Labs:   Lab Results  Component Value Date   WBC 6.5 10/07/2012   HGB 12.7 10/07/2012   HCT 37.8 10/07/2012   MCV 85.9 10/07/2012   PLT 287 10/07/2012    Recent Labs Lab 10/07/12 0942  NA 140  K 3.4*  CL 100  CO2 29  BUN 19  CREATININE 0.69  CALCIUM 9.7  PROT 7.6  BILITOT 0.5  ALKPHOS 94  ALT 23  AST 25  GLUCOSE 106*     AOZ:HYQMVH fibrillation with ST/T wave abnormality laterally  FOLLOW UP PLANS AND APPOINTMENTS Discharge Orders   Future Orders Complete By Expires     Diet - low sodium heart healthy  As directed     Increase activity slowly  As directed         Medication List         acetaminophen 500 MG tablet  Commonly known as:  TYLENOL  Take 1,000 mg by mouth every 6 (six) hours as needed for pain (knee pain).     calcium-vitamin D 500-200 MG-UNIT per tablet  Commonly known as:  OSCAL WITH D  Take 1 tablet by  mouth daily.     cholecalciferol 1000 UNITS tablet  Commonly known as:  VITAMIN D  Take 1,000 Units by mouth daily.     diltiazem 240 MG 24 hr capsule  Commonly known as:  DILACOR XR  Take 240 mg by mouth daily.     furosemide 80 MG tablet  Commonly known as:  LASIX  Take 1 tablet (80 mg total) by mouth daily.     losartan 50 MG tablet  Commonly known as:  COZAAR  Take 50 mg by mouth daily.     multivitamin with minerals Tabs  Take 1 tablet by mouth daily.     potassium chloride SA 20 MEQ tablet  Commonly known as:  K-DUR,KLOR-CON  Take 1 tablet (20 mEq total) by mouth 2 (two) times daily.     sertraline 100 MG tablet  Commonly known as:  ZOLOFT  Take 100 mg by mouth daily.      sertraline 50 MG tablet  Commonly known as:  ZOLOFT  Take 50 mg by mouth daily.     tretinoin 0.05 % cream  Commonly known as:  RETIN-A  Apply 1 Applicatorful topically at bedtime.     warfarin 5 MG tablet  Commonly known as:  COUMADIN  Take 5 mg by mouth daily.     zolpidem 10 MG tablet  Commonly known as:  AMBIEN  Take 5 mg by mouth at bedtime as needed for sleep.           Follow-up Information   Follow up with Quintella Reichert, MD On 10/08/2012. (With [employer] [facility] for INR check  at [facility])    Contact information:   [address] [place] [address] [phone]       BRING ALL MEDICATIONS WITH YOU TO FOLLOW UP APPOINTMENTS  Time spent with patient to include physician time:30 minutes Signed: TURNER,TRACI R 10/07/2012, 3:53 PM

## 2012-10-08 DIAGNOSIS — Z7901 Long term (current) use of anticoagulants: Secondary | ICD-10-CM | POA: Diagnosis not present

## 2012-10-08 DIAGNOSIS — Z954 Presence of other heart-valve replacement: Secondary | ICD-10-CM | POA: Diagnosis not present

## 2012-10-13 DIAGNOSIS — Z954 Presence of other heart-valve replacement: Secondary | ICD-10-CM | POA: Diagnosis not present

## 2012-10-13 DIAGNOSIS — Z7901 Long term (current) use of anticoagulants: Secondary | ICD-10-CM | POA: Diagnosis not present

## 2012-10-17 DIAGNOSIS — Z7901 Long term (current) use of anticoagulants: Secondary | ICD-10-CM | POA: Diagnosis not present

## 2012-10-17 DIAGNOSIS — Z954 Presence of other heart-valve replacement: Secondary | ICD-10-CM | POA: Diagnosis not present

## 2012-10-17 DIAGNOSIS — Z79899 Other long term (current) drug therapy: Secondary | ICD-10-CM | POA: Diagnosis not present

## 2012-10-20 DIAGNOSIS — Z954 Presence of other heart-valve replacement: Secondary | ICD-10-CM | POA: Diagnosis not present

## 2012-10-20 DIAGNOSIS — Z7901 Long term (current) use of anticoagulants: Secondary | ICD-10-CM | POA: Diagnosis not present

## 2012-10-24 DIAGNOSIS — Z7901 Long term (current) use of anticoagulants: Secondary | ICD-10-CM | POA: Diagnosis not present

## 2012-10-24 DIAGNOSIS — Z954 Presence of other heart-valve replacement: Secondary | ICD-10-CM | POA: Diagnosis not present

## 2012-10-28 DIAGNOSIS — Z954 Presence of other heart-valve replacement: Secondary | ICD-10-CM | POA: Diagnosis not present

## 2012-10-28 DIAGNOSIS — Z7901 Long term (current) use of anticoagulants: Secondary | ICD-10-CM | POA: Diagnosis not present

## 2012-10-31 DIAGNOSIS — I359 Nonrheumatic aortic valve disorder, unspecified: Secondary | ICD-10-CM | POA: Diagnosis not present

## 2012-10-31 DIAGNOSIS — Z7901 Long term (current) use of anticoagulants: Secondary | ICD-10-CM | POA: Diagnosis not present

## 2012-10-31 DIAGNOSIS — G4733 Obstructive sleep apnea (adult) (pediatric): Secondary | ICD-10-CM | POA: Diagnosis not present

## 2012-10-31 DIAGNOSIS — Z954 Presence of other heart-valve replacement: Secondary | ICD-10-CM | POA: Diagnosis not present

## 2012-10-31 DIAGNOSIS — I4891 Unspecified atrial fibrillation: Secondary | ICD-10-CM | POA: Diagnosis not present

## 2012-11-03 DIAGNOSIS — Z7901 Long term (current) use of anticoagulants: Secondary | ICD-10-CM | POA: Diagnosis not present

## 2012-11-03 DIAGNOSIS — Z954 Presence of other heart-valve replacement: Secondary | ICD-10-CM | POA: Diagnosis not present

## 2012-11-07 DIAGNOSIS — Z7901 Long term (current) use of anticoagulants: Secondary | ICD-10-CM | POA: Diagnosis not present

## 2012-11-07 DIAGNOSIS — Z954 Presence of other heart-valve replacement: Secondary | ICD-10-CM | POA: Diagnosis not present

## 2012-11-10 DIAGNOSIS — Z7901 Long term (current) use of anticoagulants: Secondary | ICD-10-CM | POA: Diagnosis not present

## 2012-11-10 DIAGNOSIS — Z954 Presence of other heart-valve replacement: Secondary | ICD-10-CM | POA: Diagnosis not present

## 2012-11-12 ENCOUNTER — Other Ambulatory Visit: Payer: Self-pay

## 2012-11-12 ENCOUNTER — Inpatient Hospital Stay (HOSPITAL_COMMUNITY)
Admission: RE | Admit: 2012-11-12 | Discharge: 2012-11-15 | DRG: 309 | Disposition: A | Discharge status: Home / Self Care | Payer: Medicare Other | Source: Ambulatory Visit | Attending: Cardiology | Admitting: Cardiology

## 2012-11-12 ENCOUNTER — Inpatient Hospital Stay (HOSPITAL_COMMUNITY): Payer: Medicare Other

## 2012-11-12 ENCOUNTER — Encounter (HOSPITAL_COMMUNITY): Payer: Self-pay | Admitting: General Practice

## 2012-11-12 DIAGNOSIS — E876 Hypokalemia: Secondary | ICD-10-CM | POA: Diagnosis not present

## 2012-11-12 DIAGNOSIS — Z6835 Body mass index (BMI) 35.0-35.9, adult: Secondary | ICD-10-CM

## 2012-11-12 DIAGNOSIS — G4733 Obstructive sleep apnea (adult) (pediatric): Secondary | ICD-10-CM | POA: Diagnosis present

## 2012-11-12 DIAGNOSIS — I10 Essential (primary) hypertension: Secondary | ICD-10-CM | POA: Diagnosis present

## 2012-11-12 DIAGNOSIS — E669 Obesity, unspecified: Secondary | ICD-10-CM | POA: Diagnosis present

## 2012-11-12 DIAGNOSIS — Z7901 Long term (current) use of anticoagulants: Secondary | ICD-10-CM | POA: Diagnosis not present

## 2012-11-12 DIAGNOSIS — I4891 Unspecified atrial fibrillation: Principal | ICD-10-CM | POA: Diagnosis present

## 2012-11-12 DIAGNOSIS — Z79899 Other long term (current) drug therapy: Secondary | ICD-10-CM | POA: Diagnosis not present

## 2012-11-12 DIAGNOSIS — I480 Paroxysmal atrial fibrillation: Secondary | ICD-10-CM | POA: Diagnosis present

## 2012-11-12 DIAGNOSIS — I5032 Chronic diastolic (congestive) heart failure: Secondary | ICD-10-CM | POA: Diagnosis present

## 2012-11-12 DIAGNOSIS — I509 Heart failure, unspecified: Secondary | ICD-10-CM | POA: Diagnosis present

## 2012-11-12 DIAGNOSIS — J984 Other disorders of lung: Secondary | ICD-10-CM | POA: Diagnosis not present

## 2012-11-12 DIAGNOSIS — Z954 Presence of other heart-valve replacement: Secondary | ICD-10-CM

## 2012-11-12 HISTORY — DX: Gastro-esophageal reflux disease without esophagitis: K21.9

## 2012-11-12 HISTORY — DX: Major depressive disorder, single episode, unspecified: F32.9

## 2012-11-12 HISTORY — DX: Depression, unspecified: F32.A

## 2012-11-12 LAB — CBC WITH DIFFERENTIAL/PLATELET
Hemoglobin: 12.3 g/dL (ref 12.0–15.0)
Lymphs Abs: 1.5 10*3/uL (ref 0.7–4.0)
Monocytes Relative: 8 % (ref 3–12)
Neutro Abs: 5.2 10*3/uL (ref 1.7–7.7)
Neutrophils Relative %: 69 % (ref 43–77)
RBC: 4.26 MIL/uL (ref 3.87–5.11)
WBC: 7.6 10*3/uL (ref 4.0–10.5)

## 2012-11-12 LAB — PHOSPHORUS: Phosphorus: 4.4 mg/dL (ref 2.3–4.6)

## 2012-11-12 LAB — COMPREHENSIVE METABOLIC PANEL
BUN: 19 mg/dL (ref 6–23)
Calcium: 9.4 mg/dL (ref 8.4–10.5)
Creatinine, Ser: 0.64 mg/dL (ref 0.50–1.10)
GFR calc Af Amer: >90 mL/min (ref >90)
Glucose, Bld: 108 mg/dL — ABNORMAL HIGH (ref 70–99)
Sodium: 141 mEq/L (ref 135–145)
Total Protein: 7.2 g/dL (ref 6.0–8.3)

## 2012-11-12 LAB — MAGNESIUM: Magnesium: 2.2 mg/dL (ref 1.5–2.5)

## 2012-11-12 MED ORDER — POTASSIUM CHLORIDE CRYS ER 20 MEQ PO TBCR
20.0000 meq | EXTENDED_RELEASE_TABLET | Freq: Every evening | ORAL | Status: DC
  Administered 2012-11-13 – 2012-11-14 (×2): 20 meq via ORAL
  Filled 2012-11-12 (×3): qty 1

## 2012-11-12 MED ORDER — SERTRALINE HCL 50 MG PO TABS
150.0000 mg | ORAL_TABLET | Freq: Every day | ORAL | Status: DC
  Administered 2012-11-12 – 2012-11-14 (×3): 150 mg via ORAL
  Filled 2012-11-12 (×4): qty 1

## 2012-11-12 MED ORDER — ACETAMINOPHEN 500 MG PO TABS
1000.0000 mg | ORAL_TABLET | Freq: Four times a day (QID) | ORAL | Status: DC | PRN
Start: 2012-11-12 — End: 2012-11-15
  Filled 2012-11-12: qty 2

## 2012-11-12 MED ORDER — ZOLPIDEM TARTRATE 5 MG PO TABS
5.0000 mg | ORAL_TABLET | Freq: Every evening | ORAL | Status: DC | PRN
  Administered 2012-11-12 – 2012-11-14 (×3): 5 mg via ORAL
  Filled 2012-11-12 (×3): qty 1

## 2012-11-12 MED ORDER — SODIUM CHLORIDE 0.9 % IJ SOLN: 3.0000 mL | Freq: Two times a day (BID) | INTRAMUSCULAR | Status: DC

## 2012-11-12 MED ORDER — SERTRALINE HCL 100 MG PO TABS: 100.0000 mg | ORAL_TABLET | Freq: Every day | ORAL | Status: DC

## 2012-11-12 MED ORDER — DOFETILIDE 500 MCG PO CAPS
500.0000 ug | ORAL_CAPSULE | Freq: Two times a day (BID) | ORAL | Status: DC
  Administered 2012-11-13 – 2012-11-15 (×5): 500 ug via ORAL
  Filled 2012-11-12 (×7): qty 1

## 2012-11-12 MED ORDER — LOSARTAN POTASSIUM 50 MG PO TABS
50.0000 mg | ORAL_TABLET | Freq: Every day | ORAL | Status: DC
  Administered 2012-11-13 – 2012-11-15 (×3): 50 mg via ORAL
  Filled 2012-11-12 (×4): qty 1

## 2012-11-12 MED ORDER — SODIUM CHLORIDE 0.9 % IV SOLN: 250.0000 mL | INTRAVENOUS | Status: DC | PRN

## 2012-11-12 MED ORDER — SODIUM CHLORIDE 0.9 % IV SOLN
250.0000 mL | INTRAVENOUS | Status: DC | PRN
Start: 2012-11-12 — End: 2012-11-15

## 2012-11-12 MED ORDER — WARFARIN SODIUM 5 MG PO TABS
5.0000 mg | ORAL_TABLET | Freq: Every day | ORAL | Status: DC
  Administered 2012-11-12: 5 mg via ORAL
  Filled 2012-11-12 (×2): qty 1

## 2012-11-12 MED ORDER — FUROSEMIDE 80 MG PO TABS
80.0000 mg | ORAL_TABLET | Freq: Every day | ORAL | Status: DC
  Administered 2012-11-13 – 2012-11-15 (×3): 80 mg via ORAL
  Filled 2012-11-12 (×4): qty 1

## 2012-11-12 MED ORDER — SODIUM CHLORIDE 0.9 % IJ SOLN
3.0000 mL | Freq: Two times a day (BID) | INTRAMUSCULAR | Status: DC
  Administered 2012-11-13: 3 mL via INTRAVENOUS

## 2012-11-12 MED ORDER — POTASSIUM CHLORIDE CRYS ER 20 MEQ PO TBCR: 20.0000 meq | EXTENDED_RELEASE_TABLET | Freq: Two times a day (BID) | ORAL | Status: DC

## 2012-11-12 MED ORDER — POTASSIUM CHLORIDE CRYS ER 20 MEQ PO TBCR
40.0000 meq | EXTENDED_RELEASE_TABLET | Freq: Every day | ORAL | Status: DC
  Administered 2012-11-13 – 2012-11-15 (×3): 40 meq via ORAL
  Filled 2012-11-12 (×3): qty 2

## 2012-11-12 MED ORDER — SODIUM CHLORIDE 0.9 % IJ SOLN: 3.0000 mL | INTRAMUSCULAR | Status: DC | PRN

## 2012-11-12 MED ORDER — WARFARIN SODIUM 4 MG PO TABS: 4.0000 mg | ORAL_TABLET | Freq: Every day | ORAL | Status: DC

## 2012-11-12 MED ORDER — SERTRALINE HCL 50 MG PO TABS: 50.0000 mg | ORAL_TABLET | Freq: Every day | ORAL | Status: DC

## 2012-11-12 MED ORDER — SODIUM CHLORIDE 0.9 % IJ SOLN
3.0000 mL | Freq: Two times a day (BID) | INTRAMUSCULAR | Status: DC
  Administered 2012-11-12 – 2012-11-15 (×4): 3 mL via INTRAVENOUS

## 2012-11-12 MED ORDER — VITAMIN D3 25 MCG (1000 UNIT) PO TABS
1000.0000 [IU] | ORAL_TABLET | Freq: Every day | ORAL | Status: DC
  Administered 2012-11-13 – 2012-11-15 (×3): 1000 [IU] via ORAL
  Filled 2012-11-12 (×4): qty 1

## 2012-11-12 MED ORDER — POTASSIUM CHLORIDE CRYS ER 20 MEQ PO TBCR
40.0000 meq | EXTENDED_RELEASE_TABLET | Freq: Once | ORAL | Status: AC
  Administered 2012-11-12: 40 meq via ORAL

## 2012-11-12 MED ORDER — DILTIAZEM HCL ER 240 MG PO CP24
240.0000 mg | ORAL_CAPSULE | Freq: Every day | ORAL | Status: DC
  Administered 2012-11-13 – 2012-11-15 (×3): 240 mg via ORAL
  Filled 2012-11-12 (×4): qty 1

## 2012-11-12 MED ORDER — WARFARIN - PHYSICIAN DOSING INPATIENT: Freq: Every day | Status: DC

## 2012-11-12 MED ORDER — CALCIUM CARBONATE-VITAMIN D 500-200 MG-UNIT PO TABS
1.0000 | ORAL_TABLET | Freq: Every day | ORAL | Status: DC
  Administered 2012-11-13 – 2012-11-15 (×3): 1 via ORAL
  Filled 2012-11-12 (×4): qty 1

## 2012-11-12 MED ORDER — POTASSIUM CHLORIDE CRYS ER 20 MEQ PO TBCR
40.0000 meq | EXTENDED_RELEASE_TABLET | Freq: Once | ORAL | Status: AC
  Administered 2012-11-12: 40 meq via ORAL
  Filled 2012-11-12: qty 2

## 2012-11-12 NOTE — Progress Notes (Signed)
Pharmacy Consult for Dofetilide (Tikosyn) Iniation  Admit Complaint: 67 y.o. female admitted 11/12/2012 with atrial fibrillation to be initiated on dofetilide.   Assessment:  Patient Exclusion Criteria: If any screening criteria checked as "Yes", then  patient  should NOT receive dofetilide until criteria item is corrected. If "Yes" please indicate correction plan.  YES  NO Patient  Exclusion Criteria Correction Plan  [x]  []  Baseline QTc interval is greater than or equal to 440 msec. IF above YES box checked dofetilide contraindicated unless patient has ICD; then may proceed if QTc 500-550 msec or with known ventricular conduction abnormalities may proceed with QTc 550-600 msec. QTc = 441 per ECG Discussed with MD, to proceed with initiation   []  [x]  Magnesium level is less than 1.8 mEq/l : Last magnesium:  Lab Results  Component Value Date   MG 2.2 11/12/2012         [x]  []  Potassium level is less than 4 mEq/l : Last potassium:  Lab Results  Component Value Date   K 3.7 11/12/2012       Giving 40 meq PO x 1, checking K in 4 hours  []  [x]  Patient is known or suspected to have a digoxin level greater than 2 ng/ml: No results found for this basename: DIGOXIN      []  [x]  Creatinine clearance less than 20 ml/min (calculated using Cockcroft-Gault, actual body weight and serum creatinine): Estimated Creatinine Clearance: 83.8 ml/min (by C-G formula based on Cr of 0.64).    []  [x]  Patient has received drugs known to prolong the QT intervals within the last 48 hours(phenothiazines, tricyclics or tetracyclic antidepressants, erythromycin, H-1 antihistamines, cisapride, fluoroquinolones, azithromycin). Drugs not listed above may have an, as yet, undetected potential to prolong the QT interval, updated information on QT prolonging agents is available at this website:QT prolonging agents   []  [x]  Patient received a dose of hydrochlorothiazide (Oretic) alone or in any combination including  triamterene (Dyazide, Maxzide) in the last 48 hours.   []  [x]  Patient received a medication known to increase dofetilide plasma concentrations prior to initial dofetilide dose:    Trimethoprim (Primsol, Proloprim) in the last 36 hours   Verapamil (Calan, Verelan) in the last 36 hours or a sustained release dose in the last 72 hours   Megestrol (Megace) in the last 5 days    Cimetidine (Tagamet) in the last 6 hours   Ketoconazole (Nizoral) in the last 24 hours   Itraconazole (Sporanox) in the last 48 hours    Prochlorperazine (Compazine) in the last 36 hours    []  [x]  Patient is known to have a history of torsades de pointes; congenital or acquired long QT syndromes.   []  [x]  Patient has received a Class 1 antiarrhythmic with less than 2 half-lives since last dose. (Disopyramide, Quinidine, Procainamide, Lidocaine, Mexiletine, Flecainide, Propafenone)   []  [x]  Patient has received amiodarone therapy in the past 3 months or amiodarone level is greater than 0.3 ng/ml.    Patient has been appropriately anticoagulated with Warfarin.  Ordering provider was confirmed at [email or web address] if they are not listed on the [facility] Authorized Prescribers list.  Goal of Therapy:  Follow renal function, electrolytes, potential drug interactions, and dose adjustment. Provide education and 1 week supply at discharge.  Plan:  1.  Initiate dofetilide based on renal function: [facility] Calculated CrCl  Dose q12h  [x]  > 60 ml/min 500 mcg  []  40-60 ml/min 250 mcg  []  20-40 ml/min 125 mcg   2. Follow  up QTc after the first 5 doses, renal function, electrolytes (K & Mg) daily x 3 days, dose adjustment, success of initiation and facilitate 1 week discharge supply as clinically indicated.  3. Initiate Tikosyn education video (Call [phone] and ask for video # 116).  4. Place Enrollment Form on the chart for discharge supply of dofetilide.  Thank you for allowing me to take part in this patient's  care,  Abran Duke, PharmD Clinical Pharmacist Phone: [phone] Pager: [phone] 11/12/2012 4:30 PM

## 2012-11-12 NOTE — Progress Notes (Signed)
MD on call Dr. Shirlee Latch notified of pt K level 3.6 New orders received and told to hold Tikosyn until K>4. Will continue to monitor.

## 2012-11-13 DIAGNOSIS — Z954 Presence of other heart-valve replacement: Secondary | ICD-10-CM | POA: Diagnosis not present

## 2012-11-13 DIAGNOSIS — Z79899 Other long term (current) drug therapy: Secondary | ICD-10-CM | POA: Diagnosis not present

## 2012-11-13 DIAGNOSIS — Z7901 Long term (current) use of anticoagulants: Secondary | ICD-10-CM | POA: Diagnosis not present

## 2012-11-13 DIAGNOSIS — I509 Heart failure, unspecified: Secondary | ICD-10-CM | POA: Diagnosis not present

## 2012-11-13 DIAGNOSIS — I4891 Unspecified atrial fibrillation: Secondary | ICD-10-CM | POA: Diagnosis not present

## 2012-11-13 DIAGNOSIS — I5032 Chronic diastolic (congestive) heart failure: Secondary | ICD-10-CM | POA: Diagnosis not present

## 2012-11-13 LAB — MAGNESIUM: Magnesium: 2.2 mg/dL (ref 1.5–2.5)

## 2012-11-13 LAB — PROTIME-INR: INR: 2.05 — ABNORMAL HIGH (ref 0.00–1.49)

## 2012-11-13 LAB — BASIC METABOLIC PANEL
CO2: 27 mEq/L (ref 19–32)
Calcium: 9.3 mg/dL (ref 8.4–10.5)
Chloride: 105 mEq/L (ref 96–112)
GFR calc Af Amer: >90 mL/min (ref >90)
Sodium: 140 mEq/L (ref 135–145)

## 2012-11-13 LAB — POTASSIUM: Potassium: 4.5 mEq/L (ref 3.5–5.1)

## 2012-11-13 LAB — APTT: aPTT: 38 seconds — ABNORMAL HIGH (ref 24–37)

## 2012-11-13 MED ORDER — WARFARIN SODIUM 5 MG PO TABS
5.0000 mg | ORAL_TABLET | ORAL | Status: DC
  Administered 2012-11-13: 5 mg via ORAL
  Filled 2012-11-13 (×2): qty 1

## 2012-11-13 MED ORDER — WARFARIN SODIUM 4 MG PO TABS
4.0000 mg | ORAL_TABLET | ORAL | Status: DC
  Administered 2012-11-14: 4 mg via ORAL
  Filled 2012-11-13: qty 1

## 2012-11-13 NOTE — Progress Notes (Signed)
Utilization review completed.  

## 2012-11-13 NOTE — Care Management Note (Addendum)
  Page 1 of 1   11/13/2012     10:49:18 AM   CARE MANAGEMENT NOTE 11/13/2012  Patient:  CHANNEL, PAPANDREA   Account Number:  [id number]  Date Initiated:  11/13/2012  Documentation initiated by:  GRAVES-BIGELOW,Wandalene Abrams  Subjective/Objective Assessment:   Pt admitted for Tikosyn Load.     Action/Plan:   CM did call [facility] on [address] to make sure they can dispense tikosyn and thry can. Benefits check in process. Will make pt aware once complete.   Anticipated DC Date:  11/15/2012   Anticipated DC Plan:  HOME/SELF CARE      DC Planning Services  CM consult      Choice offered to / List presented to:             Status of service:  Completed, signed off Medicare Important Message given?   (If response is "NO", the following Medicare IM given date fields will be blank) Date Medicare IM given:   Date Additional Medicare IM given:    Discharge Disposition:  HOME/SELF CARE  Per UR Regulation:  Reviewed for med. necessity/level of care/duration of stay  If discussed at Long Length of Stay Meetings, dates discussed:    Comments:  11-13-12 1044 Tomi Bamberger, RN,BSN [phone] Pt will need Rx for 7 day supply no refills to be filled in the main pharmacy and then the original Rx with refills for Tikosyn. Tikosyn Co pay: IS $37.00 FOR A 30 DAY SUPPLY USING PREFERRED DRUG ([facility]) - NO PRIOR AUTH REQUIRED.

## 2012-11-13 NOTE — H&P (Signed)
CC:  Atrial fibrilation   HPI:  General:  The patient presents back today for followup of her afib, AVR.  She recently underwent DCCV which was unsuccessful.  She had some mild conduction system delay in QRS on EKG and therefore we wanted to avoid use of Flecainide.  She has not tolerated beta blockers in the past well and therefore it was felt that we should avoid Sotolol.  Amiodarone was not felt to be the best option since we have tried to run her INR on the lower end of therapeutic due to prior history of subdural bleed and the interactions of amio with coumadin.  She has had 5 weeks of therapeutic INRs and had an INR this am in the office which was 2.3 and is now admitted for Tikosyn loading.  She denies any chest pain, SOB, DOE, LE edema, dizziness or syncope.        ROS:  See HPI, A twelve system review was perfomed at today's visit. For pertinent positives and negatives see HPI.       Medical History: Obesity, Hypertension, aortic stenosis status post aortic valve replacement with St. Jude mechanical prosthesis, prior left subdural hematoma requiring evacuation via twist drill craniotomy in May 2006 secondary to INR of 2.2., systemic anticoagulation with INR goal of 1.8-2.0 secondary history of intracranial bleed - on for her AVR, Beta blocker intolerance, Elevated cholesterol, 4/14 TEE cardioversion success but return to Atrial fibrillation on 07/29/12.        Social History:  General:  Tobacco use cigarettes: Never smoked.  no Smoking.  no Alcohol.  Caffeine: yes, coffee, 2 servings daily.  no Recreational drug use.  Exercise: yes, 4 x week, walks when able.  Occupation: [employer], retired.  Marital Status: married.  Children: 1, Boys.        Medications: Taking Centrum Silver Tablet 1 tablet once a day, Taking Oscal 500/200 D-3 500-200 MG-UNIT Tablet 1 tablets Once a day, Taking Tylenol Extra Strength 500 MG Tablet 2 tablets as needed, Taking Zoloft 100 Milligram Tablet TAKE  1 AND 1/2 TABLETS BY MOUTH ONCE DAILY , Taking Lasix 80 MG Tablet 1 tablet Once a dayand extra as directed, Taking Ambien 10 MG Tablet 1/2-1 tablet at bedtime as needed, Taking Vitamin D 1000 UNIT Capsule 1 capsule Once a day, Taking Losartan Potassium 50.0 Milligram Tablet TAKE 1 TABLET BY MOUTH DAILY , Taking Diltiazem HCl ER Beads 300 MG Capsule Extended Release 24 Hour 1 capsule Once a day, Taking Coumadin 1 MG 1 MG Tablet 1 tablet to take with 5 MG warfarin when needed, Taking Klor-Con 10 10 MEQ Tablet Extended Release 2 tablet Once a day, Notes: has only been taking one a day, Taking Coumadin 5 MG Tablet 5 mg qd Once a day, Medication List reviewed and reconciled with the patient       Allergies: Codeine (for allergy): jerking: Allergy, Dilantin: rash: Allergy, Toprol XL: unknown: Side Effects, Beta blockers: depression.           Vitals: Wt 232.6, Wt change -.2 lb, Ht 65, BMI 38.70, Pulse sitting 84, BP sitting 110/82 large cuff.       Examination:  Cardiology, General:  GENERAL APPEARANCE: pleasant, NAD.  HEENT: unremarkable.  CAROTID UPSTROKE: normal, no bruit.  JVD: flat.  HEART SOUNDS: regular, normal S1, S2, no S3 or S4.  MURMUR: absent.  LUNGS: no rales or wheezes.  ABDOMEN: soft, non tender, positive bowel sounds, no masses felt.  EXTREMITIES: no leg edema.  PERIPHERAL PULSES: 2 plus bilateral.            Assessment:  1. Atrial fibrillation - 427.31 (Primary)  2. Aortic valve disorders - 424.1  3. Anticoagulant monitoring - V58.61  4. Heart valve replaced by other means - V43.3  5. OSA (obstructive sleep apnea) - 327.23        1. Atrial fibrillation  Continue Diltiazem HCl ER Beads Capsule Extended Release 24 Hour, 300 MG, 1 capsule, Orally, Once a day ; Continue Coumadin 1 MG Tablet, 1 MG, 1 tablet, Orally, to take with 5 MG warfarin when needed ; Continue Coumadin Tablet, 5 MG, 5 mg qd, Orally, Once a day .  Notes: Her INR has been therapeutic for 5 weeks. I  discussed the case with ner neurosurgeon Marikay Alar who treated her in 2006 for her subdural bleed on coumadin. He feels that at this time the risk of a CVA form afib with subtherapeutic INR is far greater than a recurrent bleed and has recommended that we proceed with full anticoagulation with goal INR of 2-2.5. She is now  therapeutic for over 5 weeks and is admitted to [facility]  for drug load with Tikosyn.       2. Anticoagulant monitoring  Notes: The patient's PT/INR was 2.3 in office today.  Will continue with Coumadin 5 mg daily except 4mg  on Monday and Friday         Provider: Armanda Magic, MD  Patient: [name], [name] DOB: 03/04/1946

## 2012-11-13 NOTE — Progress Notes (Signed)
PHARMACIST - PHYSICIAN COMMUNICATION CONCERNING: Pharmacy Care Issues Regarding Warfarin Labs  RECOMMENDATION (Action Taken): A baseline and daily protime for three days has been ordered to meet the [facility] Patient safety goal and comply with the current [facility] Pharmacy & Therapeutics Committee policy.   The Pharmacy will defer all warfarin dose order changes and follow up of lab results to the prescriber unless an additional order to initiate a "pharmacy Coumadin consult" is placed.  DESCRIPTION:  While hospitalized, to be in compliance with The Joint Commission National Patient Safety Goals, all patients on warfarin must have a baseline and/or current protime prior to the administration of warfarin. Pharmacy has received your order for warfarin without these required laboratory assessments.  Sahory Nordling L. Illene Bolus, PharmD, BCPS Clinical Pharmacist Pager: [phone] Pharmacy: [phone] 11/13/2012 11:21 AM

## 2012-11-13 NOTE — Progress Notes (Signed)
Patient Name: [name] Date of Encounter: 11/13/2012    SUBJECTIVE: The patient has no complaints. She did not receive take [place] in last evening because of hypokalemia, 3.6. She has received the first dose this morning.  TELEMETRY:  Atrial fibrillation with poor rate control: Filed Vitals:   11/12/12 1329 11/12/12 2114 11/13/12 0500  BP: 120/84 120/77 128/72  Pulse:  78 76  Temp: 97.8 F (36.6 C) 98.3 F (36.8 C) 98.4 F (36.9 C)  TempSrc: Oral Oral Oral  Resp: 18 18 18   Height: 5\' 7"  (1.702 m)    Weight: 102.059 kg (225 lb)    SpO2: 96% 97% 98%    Intake/Output Summary (Last 24 hours) at 11/13/12 0952 Last data filed at 11/13/12 0800  Gross per 24 hour  Intake    603 ml  Output      0 ml  Net    603 ml    LABS: Basic Metabolic Panel:  Recent Labs  62/95/28 1400  11/13/12 0014 11/13/12 0448  NA 141  --   --  140  K 3.7  < > 4.5 4.2  CL 102  --   --  105  CO2 28  --   --  27  GLUCOSE 108*  --   --  104*  BUN 19  --   --  17  CREATININE 0.64  --   --  0.65  CALCIUM 9.4  --   --  9.3  MG 2.2  --   --  2.2  PHOS 4.4  --   --   --   < > = values in this interval not displayed. CBC:  Recent Labs  11/12/12 1400  WBC 7.6  NEUTROABS 5.2  HGB 12.3  HCT 36.7  MCV 86.2  PLT 274   INR/PT: 2.07  Radiology/Studies:  No acute  Physical Exam: Blood pressure 128/72, pulse 76, temperature 98.4 F (36.9 C), temperature source Oral, resp. rate 18, height 5\' 7"  (1.702 m), weight 102.059 kg (225 lb), SpO2 98.00%. Weight change:    No acute distress  Unremarkable except irregularly irregular rhythm.  Mechanical aortic valve closure sound. One of 6 systolic murmur right upper sternal border.  ASSESSMENT:  1. Atrial fibrillation with only moderate rate control  2. Monitoring for potentially toxic medication, Tikosyn  3. Relative hypokalemia has been resolved   Plan:  1. Tikosyn protocol  2. Will notify Dr. Mayford Knife that the patient has been admitted.  I am unable to find any documentation such as admitting H&P  Signed, Lesleigh Noe 11/13/2012, 9:52 AM

## 2012-11-14 ENCOUNTER — Other Ambulatory Visit: Payer: Self-pay

## 2012-11-14 DIAGNOSIS — I5032 Chronic diastolic (congestive) heart failure: Secondary | ICD-10-CM | POA: Diagnosis not present

## 2012-11-14 DIAGNOSIS — I10 Essential (primary) hypertension: Secondary | ICD-10-CM | POA: Diagnosis not present

## 2012-11-14 DIAGNOSIS — I4891 Unspecified atrial fibrillation: Secondary | ICD-10-CM | POA: Diagnosis not present

## 2012-11-14 DIAGNOSIS — I509 Heart failure, unspecified: Secondary | ICD-10-CM | POA: Diagnosis not present

## 2012-11-14 DIAGNOSIS — Z7901 Long term (current) use of anticoagulants: Secondary | ICD-10-CM | POA: Diagnosis not present

## 2012-11-14 DIAGNOSIS — Z954 Presence of other heart-valve replacement: Secondary | ICD-10-CM | POA: Diagnosis not present

## 2012-11-14 LAB — BASIC METABOLIC PANEL
BUN: 14 mg/dL (ref 6–23)
CO2: 27 mEq/L (ref 19–32)
Calcium: 9.4 mg/dL (ref 8.4–10.5)
Chloride: 103 mEq/L (ref 96–112)
Creatinine, Ser: 0.58 mg/dL (ref 0.50–1.10)

## 2012-11-14 NOTE — Progress Notes (Signed)
SUBJECTIVE:  Doing well no complaints OBJECTIVE:   Vitals:   Filed Vitals:   11/13/12 1027 11/13/12 1324 11/13/12 2247 11/14/12 0508  BP: 121/93 132/81 124/92 119/72  Pulse:  103 94 105  Temp:  97.8 F (36.6 C) 98.5 F (36.9 C) 98.2 F (36.8 C)  TempSrc:   Oral Oral  Resp:  17 18 18   Height:      Weight:      SpO2:  97% 96% 95%   I&O's:   Intake/Output Summary (Last 24 hours) at 11/14/12 0954 Last data filed at 11/13/12 1300  Gross per 24 hour  Intake    363 ml  Output      2 ml  Net    361 ml   TELEMETRY: Reviewed telemetry pt in atrial fibrillation with QTc   PHYSICAL EXAM General: Well developed, well nourished, in no acute distress Head: Eyes PERRLA, No xanthomas.   Normal cephalic and atramatic  Lungs:   Clear bilaterally to auscultation and percussion. Heart:  Irregularly irregular S1 S2 Pulses are 2+ & equal. Abdomen: Bowel sounds are positive, abdomen soft and non-tender without masses  Extremities:   No clubbing, cyanosis or edema.  DP +1 Neuro: Alert and oriented X 3. Psych:  Good affect, responds appropriately   LABS: Basic Metabolic Panel:  Recent Labs  16/10/96 1400  11/13/12 0448 11/14/12 0550  NA 141  --  140 140  K 3.7  < > 4.2 4.1  CL 102  --  105 103  CO2 28  --  27 27  GLUCOSE 108*  --  104* 106*  BUN 19  --  17 14  CREATININE 0.64  --  0.65 0.58  CALCIUM 9.4  --  9.3 9.4  MG 2.2  --  2.2 2.2  PHOS 4.4  --   --   --   < > = values in this interval not displayed. Liver Function Tests:  Recent Labs  11/12/12 1400  AST 26  ALT 22  ALKPHOS 98  BILITOT 0.4  PROT 7.2  ALBUMIN 3.9   No results found for this basename: LIPASE, AMYLASE,  in the last 72 hours CBC:  Recent Labs  11/12/12 1400  WBC 7.6  NEUTROABS 5.2  HGB 12.3  HCT 36.7  MCV 86.2  PLT 274    Lab Results  Component Value Date   INR 2.09* 11/14/2012   INR 2.05* 11/13/2012   INR 1.97* 11/12/2012    RADIOLOGY: X-ray Chest Pa And Lateral   11/12/2012    *RADIOLOGY REPORT*  Clinical Data: Atrial fibrillation  CHEST - 2 VIEW  Comparison: 07/18/2012  Findings: Cardiac shadow is enlarged.  Postsurgical changes are again seen.  The lungs are well aerated without focal infiltrate or sizable effusion.  Some scarring is noted in the left lung base.  IMPRESSION: No acute abnormality noted.   Original Report Authenticated By: Alcide Clever, M.D.   ASSESSMENT:  1. Atrial fibrillation with moderate rate control  2. Monitoring for potentially toxic medication, Tikosyn  3. Relative hypokalemia has been resolved  Plan:  1. Tikosyn protocol  2. Continue systemic anticoagulation 3.  D/C in am if QTc ok     Quintella Reichert, MD  11/14/2012  9:54 AM

## 2012-11-15 DIAGNOSIS — E876 Hypokalemia: Secondary | ICD-10-CM | POA: Diagnosis not present

## 2012-11-15 DIAGNOSIS — I509 Heart failure, unspecified: Secondary | ICD-10-CM | POA: Diagnosis not present

## 2012-11-15 DIAGNOSIS — Z79899 Other long term (current) drug therapy: Secondary | ICD-10-CM | POA: Diagnosis not present

## 2012-11-15 DIAGNOSIS — I4891 Unspecified atrial fibrillation: Secondary | ICD-10-CM | POA: Diagnosis not present

## 2012-11-15 DIAGNOSIS — I10 Essential (primary) hypertension: Secondary | ICD-10-CM | POA: Diagnosis not present

## 2012-11-15 DIAGNOSIS — I5032 Chronic diastolic (congestive) heart failure: Secondary | ICD-10-CM | POA: Diagnosis not present

## 2012-11-15 DIAGNOSIS — Z954 Presence of other heart-valve replacement: Secondary | ICD-10-CM | POA: Diagnosis not present

## 2012-11-15 LAB — BASIC METABOLIC PANEL
BUN: 13 mg/dL (ref 6–23)
CO2: 27 mEq/L (ref 19–32)
Glucose, Bld: 109 mg/dL — ABNORMAL HIGH (ref 70–99)
Potassium: 4.1 mEq/L (ref 3.5–5.1)
Sodium: 138 mEq/L (ref 135–145)

## 2012-11-15 LAB — PROTIME-INR: INR: 2.22 — ABNORMAL HIGH (ref 0.00–1.49)

## 2012-11-15 MED ORDER — DOFETILIDE 500 MCG PO CAPS: 500.0000 ug | ORAL_CAPSULE | Freq: Two times a day (BID) | ORAL | Status: DC

## 2012-11-15 NOTE — Progress Notes (Signed)
Pt may continue to take Tylenol and Ambien.  This was verified with Dr. Anne Fu via telephone.  I have explained this to pt and have updated the physical copy of d/c instructions.  Pt agrees.

## 2012-11-15 NOTE — Care Management Note (Signed)
   CARE MANAGEMENT NOTE 11/15/2012  Patient:  TANAYAH, SQUITIERI   Account Number:  [id number]  Date Initiated:  11/13/2012  Documentation initiated by:  GRAVES-BIGELOW,BRENDA  Subjective/Objective Assessment:   Pt admitted for Tikosyn Load.     Action/Plan:   CM did call [facility] on [address] to make sure they can dispense tikosyn and thry can. Benefits check in process. Will make pt aware once complete.   Anticipated DC Date:  11/15/2012   Anticipated DC Plan:  HOME/SELF CARE      DC Planning Services  CM consult      Choice offered to / List presented to:             Status of service:  Completed, signed off Medicare Important Message given?   (If response is "NO", the following Medicare IM given date fields will be blank) Date Medicare IM given:   Date Additional Medicare IM given:    Discharge Disposition:  HOME/SELF CARE  Per UR Regulation:  Reviewed for med. necessity/level of care/duration of stay  If discussed at Long Length of Stay Meetings, dates discussed:    Comments:  11/15/12-- Duke Salvia [phone] Received call from patient's nurse regarding Tikosyn. Spoke with pharmacy who confirmed separate prescription for 7 days to be filled here at [facility]. Spoke with patient's nurse who was able to get separate prescription from MD and rx was sent to pharmacy. Nursing to pick up when ready. No further needs assessed. ATIKAHALLRNCM [phone]    11-13-12 [address], [place] [phone] Pt will need Rx for 7 day supply no refills to be filled in the main pharmacy and then the original Rx with refills for Tikosyn. Tikosyn Co pay:  IS $37.00 FOR A 30 DAY SUPPLY USING PREFERRED DRUG ([facility]) - NO PRIOR AUTH REQUIRED.

## 2012-11-15 NOTE — Progress Notes (Signed)
Awaiting ECG at 11:30am after last dose of Tikosyn.   Feeling well.  Will DC if ECG OK, QTc.

## 2012-11-15 NOTE — Discharge Summary (Signed)
Physician Discharge Summary  Patient ID: [name] MRN: [id number] DOB/AGE: 67/31/1947 67 y.o.  Admit date: 11/12/2012 Discharge date: 11/15/2012  Admission Diagnoses:  Discharge Diagnoses:  Active Problems:   Atrial fibrillation   Essential hypertension   Chronic anticoagulation   Hypokalemia   Chronic diastolic CHF (congestive heart failure)   Discharged Condition: good  Hospital Course: 67 year old with atrial flutter/fibrillation admitted for dofetilide load. She has tolerated this medication well. No complaints. Her measured QT interval on last EKG is approximately 410 ms. QTC measured below .  She states that she has an appointment with Dr. Dareen Piano perhaps next week. She continues with anticoagulation.  Discharge Exam: Blood pressure 127/82, pulse 97, temperature 97.5 F (36.4 C), temperature source Oral, resp. rate 20, height 5\' 7"  (1.702 m), weight 102.059 kg (225 lb), SpO2 96.00%.  General: Alert and oriented x3, comfortable CV: Irregularly irregular rhythm, no JVD Lungs: Clear to auscultation bilaterally Abdomen: Overweight, positive bowel sounds, soft, nontender Extremities: No edema  Disposition: 01-Home or Self Care  Discharge Orders   Future Orders Complete By Expires     Diet - low sodium heart healthy  As directed     Increase activity slowly  As directed         Medication List    STOP taking these medications       acetaminophen 500 MG tablet  Commonly known as:  TYLENOL     zolpidem 10 MG tablet  Commonly known as:  AMBIEN      TAKE these medications       calcium-vitamin D 500-200 MG-UNIT per tablet  Commonly known as:  OSCAL WITH D  Take 1 tablet by mouth daily.     cholecalciferol 1000 UNITS tablet  Commonly known as:  VITAMIN D  Take 1,000 Units by mouth daily.     diltiazem 240 MG 24 hr capsule  Commonly known as:  DILACOR XR  Take 240 mg by mouth daily.     dofetilide 500 MCG capsule  Commonly known  as:  TIKOSYN  Take 1 capsule (500 mcg total) by mouth every 12 (twelve) hours.     furosemide 80 MG tablet  Commonly known as:  LASIX  Take 1 tablet (80 mg total) by mouth daily.     losartan 50 MG tablet  Commonly known as:  COZAAR  Take 50 mg by mouth daily.     multivitamin with minerals Tabs tablet  Take 1 tablet by mouth daily.     potassium chloride SA 20 MEQ tablet  Commonly known as:  K-DUR,KLOR-CON  Take 40 mEq by mouth every morning.     sertraline 100 MG tablet  Commonly known as:  ZOLOFT  Take 150 mg by mouth at bedtime.     tretinoin 0.05 % cream  Commonly known as:  RETIN-A  Apply 1 Applicatorful topically at bedtime.     warfarin 1 MG tablet  Commonly known as:  COUMADIN  Take 4-5 mg by mouth See admin instructions. Takes 4mg  on Mon & Fri & 5mg  on all other days in the evening           Follow-up Information   Follow up with Quintella Reichert, MD. Schedule an appointment as soon as possible for a visit in 1 week.   Contact information:   [address] [place] [address] [phone]      She knows to seek medical attention if issues worsen.  SignedDonato Schultz 11/15/2012, 12:03  PM   

## 2012-11-20 DIAGNOSIS — I10 Essential (primary) hypertension: Secondary | ICD-10-CM | POA: Diagnosis not present

## 2012-11-20 DIAGNOSIS — I4891 Unspecified atrial fibrillation: Secondary | ICD-10-CM | POA: Diagnosis not present

## 2012-11-20 DIAGNOSIS — Z7901 Long term (current) use of anticoagulants: Secondary | ICD-10-CM | POA: Diagnosis not present

## 2012-11-20 DIAGNOSIS — Z954 Presence of other heart-valve replacement: Secondary | ICD-10-CM | POA: Diagnosis not present

## 2012-11-20 DIAGNOSIS — G4733 Obstructive sleep apnea (adult) (pediatric): Secondary | ICD-10-CM | POA: Diagnosis not present

## 2012-11-20 DIAGNOSIS — I5032 Chronic diastolic (congestive) heart failure: Secondary | ICD-10-CM | POA: Diagnosis not present

## 2012-11-21 ENCOUNTER — Other Ambulatory Visit: Payer: Self-pay | Admitting: Cardiology

## 2012-11-24 ENCOUNTER — Other Ambulatory Visit: Payer: Self-pay | Admitting: Cardiology

## 2012-11-24 DIAGNOSIS — Z79899 Other long term (current) drug therapy: Secondary | ICD-10-CM | POA: Diagnosis not present

## 2012-11-24 NOTE — H&P (Signed)
Office Visit     Patient: [name], [name] Account Number: [id number] Provider: Armanda Magic, MD  DOB: 09-30-1945 Age: 67 Y Sex: Female Date: 11/20/2012  Phone: [phone]   Address: [address], [place], [id number]  Pcp: Carolin Coy          1. TT/Hospital F/u/Per TT.        HPI:  General:  The patient presents today for followup of her recent hospitalization for drug load with Tikosyn. She is doing well. She denies any SOB, chest pain, dizziness or palpitations. She tolerates the Tikosyn well. .        ROS:  See HPI, A twelve system review was perfomed at today's visit. For pertinent positives and negatives see HPI.       Medical History: Obesity, Hypertension, aortic stenosis status post aortic valve replacement with St. Jude mechanical prosthesis, prior left subdural hematoma requiring evacuation via twist drill craniotomy in May 2006 secondary to INR of 2.2., systemic anticoagulation with INR goal of 2.0-2.5 secondary history of intracranial bleed - on for her AVR, Beta blocker intolerance, Elevated cholesterol, 4/14 TEE cardioversion success but return to Atrial fibrillation on 07/29/12.        Family History: Father: deceased 55 yrs, elevated cholesterol, hypertension, cancer of esophagus, diagnosed with HTNMother: deceased 25 yrs, lung cancer, hypertension, diagnosed with HTN, Lung CaBrother 1: alive 48 yrs, heart disease, emphysema, defibrilator, diagnosed with CAD, COPDSister 1: alive 100 yrs, Fibromyalgia, hepatitiis in past neg. f. hx. colon/breast cancer Denies family history of colon polyps.       Social History:  General:  Tobacco use cigarettes: Never smoked.  no Smoking.  no Alcohol.  Caffeine: yes, coffee, 2 servings daily.  no Recreational drug use.  Exercise: yes, 4 x week, walks when able.  Occupation: [employer], retired.  Marital Status: married.  Children: 1, Boys.        Medications: Taking Centrum Silver Tablet 1 tablet once a  day, Taking Oscal 500/200 D-3 500-200 MG-UNIT Tablet 1 tablets Once a day, Taking Tylenol Extra Strength 500 MG Tablet 2 tablets as needed, Taking Zoloft 100 Milligram Tablet TAKE 1 AND 1/2 TABLETS BY MOUTH ONCE DAILY , Taking Lasix 80 MG Tablet 1 tablet Once a dayand extra as directed, Taking Vitamin D 1000 UNIT Capsule 1 capsule Once a day, Taking Losartan Potassium 50.0 Milligram Tablet TAKE 1 TABLET BY MOUTH DAILY , Taking Klor-Con 10 10 MEQ Tablet Extended Release 2 tablet Once a day, Notes: has only been taking one a day, Taking Diltiazem HCl ER Beads 300 MG Capsule Extended Release 24 Hour 1 capsule Once a day, Taking Coumadin 1 MG 1 MG Tablet 1 tablet to take with 5 MG warfarin when needed, Taking Ambien 10 MG Tablet 1/2-1 tablet at bedtime as needed, Taking Coumadin 5 MG Tablet 5 mg qd except 4 mg Monday and Friday Once a day, Taking Tikosyn 250 MCG Capsule 2 tablets Twice a day, Medication List reviewed and reconciled with the patient       Allergies: Codeine (for allergy): jerking: Allergy, Dilantin: rash: Allergy, Toprol XL: unknown: Side Effects, Beta blockers: depression.           Examination:  Cardiology, General:  GENERAL APPEARANCE: pleasant, NAD.  HEENT: unremarkable.  CAROTID UPSTROKE: normal, no bruit.  JVD: flat.  HEART SOUNDS: regular, normal S1, S2, no S3 or S4.  MURMUR: absent.  LUNGS: no rales or wheezes.  ABDOMEN: soft, non tender, positive bowel sounds, no masses felt.  EXTREMITIES: no leg edema.  PERIPHERAL PULSES: 2 plus bilateral.            Assessment:  1. Atrial fibrillation - 427.31 (Primary)  2. OSA (obstructive sleep apnea) - 327.23  3. Benign essential HTN - 401.1  4. Chronic diastolic (congestive) heart failure - 428.32        1. Atrial fibrillation  Continue Tikosyn Capsule, 250 MCG, 2 tablets, Orally, Twice a day .  LAB: Basic Metabolic LAB: CBC with Diff Notes: She is still in afib so I will set her up for DCCV next week.       2. Benign  essential HTN  Continue Diltiazem HCl ER Beads Capsule Extended Release 24 Hour, 300 MG, 1 capsule, Orally, Once a day ; Continue Losartan Potassium Tablet, 50.0 Milligram, TAKE 1 TABLET BY MOUTH DAILY .  Notes: Patient Educated with: [facility] BP Action plan.pdf ([facility] BP Action plan.pdf).       3. Chronic diastolic (congestive) heart failure  Continue Lasix Tablet, 80 MG, 1 tablet, Orally, Once a dayand extra as directed .       4. Others  Imaging: EKG atrial fibrillation     Stegall,Amy 11/20/2012 11:05:16 AM > Jemell Town M 11/20/2012 11:17:16 AM >       Procedures:  Venipuncture:  Venipuncture: Howell,Kathleen 11/20/2012 12:05:49 PM > , performed in right arm.           Labs:    Lab: Basic Metabolic  GLUCOSE 89  70-99 - mg/dL  BUN 18  2-95 - mg/dL  CREATININE 6.21  3.08-6.57 - mg/dl  eGFR (NON-AFRICAN AMERICAN) 82  >60 - calc  eGFR (AFRICAN AMERICAN) 99  >60 - calc  SODIUM 140  136-145 - mmol/L  POTASSIUM 3.9  3.5-5.5 - mmol/L  CHLORIDE 100  98-107 - mmol/L  C02 31  22-32 - mmol/L  ANION GAP 13.1  6.0-20.0 - mmol/L  CALCIUM 9.6  8.6-10.3 - mg/dL   Mikael Debell M 84/69/6295 03:17:03 PM > please find out how much potassium patient is taking and update in med list Stegall,Amy 11/20/2012 03:26:01 PM > lmtrc Corson,Danielle 11/20/2012 04:29:25 PM > Pt states she is taking 20 MEQ 1 tablet BID Gail Creekmore M 11/20/2012 04:31:34 PM > please have her increase potassium to 2 tablets in the am and 1 tablet in the PM and recheck BMET monday 8/18. Corson,Danielle 11/21/2012 09:07:47 AM > Pt is aware. Lab set up for pt.        Lab: CBC with Diff Normal  WBC 7.3  4.0-11.0 - K/ul  RBC 4.47  4.20-5.40 - M/uL  HGB 13.1  12.0-16.0 - g/dL  HCT 28.4  13.2-44.0 - %  MCH 29.2  27.0-33.0 - pg  MPV 9.1  7.5-10.7 - fL  MCV 86.8  81.0-99.0 - fL  MCHC 33.7  32.0-36.0 - g/dL  RDW 10.2 H 72.5-36.6 - %  NRBC# 0.01  -   PLT 283  150-400 - K/uL  NEUT % 57.3  43.3-71.9 - %  NRBC% 0.10  - %  LYMPH%  25.8  16.8-43.5 - %  MONO % 12.7 H 4.6-12.4 - %  EOS % 3.2  0.0-7.8 - %  BASO % 1.0  0.0-1.0 - %  NEUT # 4.2  1.9-7.2 - K/uL  LYMPH# 1.90  1.10-2.70 - K/uL  MONO # 0.9 H 0.3-0.8 - K/uL  EOS # 0.2  0.0-0.6 - K/uL  BASO # 0.1  0.0-0.1 - K/uL   Meisha Salone M 11/20/2012 03:17:55 PM > Stegall,Amy 11/20/2012  03:26:14 PM > lmtrc Corson,Danielle 11/21/2012 09:04:34 AM > Pt is aware.          Procedure Codes: 16109 EKG I AND R, 80048 ECL BMP, 85025 ECL CBC PLATELET DIFF, 60454 BLOOD COLLECTION ROUTINE VENIPUNCTURE       Follow Up: DCCV         Provider: Armanda Magic, MD  Patient: [name], [name] DOB: 01/15/1946 Date: 11/20/2012

## 2012-11-25 ENCOUNTER — Encounter (HOSPITAL_COMMUNITY)
Admission: RE | Disposition: A | Discharge status: Home / Self Care | Payer: Self-pay | Source: Ambulatory Visit | Attending: Cardiology

## 2012-11-25 ENCOUNTER — Ambulatory Visit (HOSPITAL_COMMUNITY): Payer: Medicare Other | Admitting: Certified Registered"

## 2012-11-25 ENCOUNTER — Encounter (HOSPITAL_COMMUNITY): Payer: Self-pay | Admitting: Certified Registered"

## 2012-11-25 ENCOUNTER — Encounter (HOSPITAL_COMMUNITY): Payer: Self-pay | Admitting: *Deleted

## 2012-11-25 ENCOUNTER — Ambulatory Visit (HOSPITAL_COMMUNITY)
Admission: RE | Admit: 2012-11-25 | Discharge: 2012-11-25 | Disposition: A | Discharge status: Home / Self Care | Payer: Medicare Other | Source: Ambulatory Visit | Attending: Cardiology | Admitting: Cardiology

## 2012-11-25 DIAGNOSIS — E7800 Pure hypercholesterolemia, unspecified: Secondary | ICD-10-CM | POA: Insufficient documentation

## 2012-11-25 DIAGNOSIS — I10 Essential (primary) hypertension: Secondary | ICD-10-CM | POA: Diagnosis not present

## 2012-11-25 DIAGNOSIS — Z8249 Family history of ischemic heart disease and other diseases of the circulatory system: Secondary | ICD-10-CM | POA: Insufficient documentation

## 2012-11-25 DIAGNOSIS — Z888 Allergy status to other drugs, medicaments and biological substances status: Secondary | ICD-10-CM | POA: Diagnosis not present

## 2012-11-25 DIAGNOSIS — I5032 Chronic diastolic (congestive) heart failure: Secondary | ICD-10-CM | POA: Diagnosis not present

## 2012-11-25 DIAGNOSIS — I509 Heart failure, unspecified: Secondary | ICD-10-CM | POA: Diagnosis not present

## 2012-11-25 DIAGNOSIS — Z79899 Other long term (current) drug therapy: Secondary | ICD-10-CM | POA: Diagnosis not present

## 2012-11-25 DIAGNOSIS — Z954 Presence of other heart-valve replacement: Secondary | ICD-10-CM | POA: Diagnosis not present

## 2012-11-25 DIAGNOSIS — Z7901 Long term (current) use of anticoagulants: Secondary | ICD-10-CM | POA: Diagnosis not present

## 2012-11-25 DIAGNOSIS — I359 Nonrheumatic aortic valve disorder, unspecified: Secondary | ICD-10-CM | POA: Diagnosis not present

## 2012-11-25 DIAGNOSIS — Z538 Procedure and treatment not carried out for other reasons: Secondary | ICD-10-CM | POA: Diagnosis not present

## 2012-11-25 DIAGNOSIS — E669 Obesity, unspecified: Secondary | ICD-10-CM | POA: Insufficient documentation

## 2012-11-25 DIAGNOSIS — G4733 Obstructive sleep apnea (adult) (pediatric): Secondary | ICD-10-CM | POA: Diagnosis not present

## 2012-11-25 DIAGNOSIS — Z885 Allergy status to narcotic agent status: Secondary | ICD-10-CM | POA: Diagnosis not present

## 2012-11-25 DIAGNOSIS — I4891 Unspecified atrial fibrillation: Secondary | ICD-10-CM | POA: Diagnosis not present

## 2012-11-25 DIAGNOSIS — Z8673 Personal history of transient ischemic attack (TIA), and cerebral infarction without residual deficits: Secondary | ICD-10-CM | POA: Diagnosis not present

## 2012-11-25 HISTORY — PX: CARDIOVERSION: SHX1299

## 2012-11-25 LAB — PROTIME-INR
INR: 1.71 — ABNORMAL HIGH (ref 0.00–1.49)
Prothrombin Time: 19.6 seconds — ABNORMAL HIGH (ref 11.6–15.2)

## 2012-11-25 SURGERY — CARDIOVERSION
Anesthesia: Monitor Anesthesia Care

## 2012-11-25 MED ORDER — SODIUM CHLORIDE 0.9 % IV SOLN
INTRAVENOUS | Status: DC
  Administered 2012-11-25: 11:00:00 via INTRAVENOUS

## 2012-11-25 NOTE — H&P (View-Only) (Signed)
Office Visit     Patient: [name], [name] Account Number: [id number] Provider: Vaudie Engebretsen, MD  DOB: 08/13/1945 Age: 67 Y Sex: Female Date: 11/20/2012  Phone: [phone]   Address: [address], [place], [place]  Pcp: CAROLA WESTERMANN          1. [name]/u/Per TT.        HPI:  General:  The patient presents today for followup of her recent hospitalization for drug load with Tikosyn. She is doing well. She denies any SOB, chest pain, dizziness or palpitations. She tolerates the Tikosyn well. .        ROS:  See HPI, A twelve system review was perfomed at today's visit. For pertinent positives and negatives see HPI.       Medical History: Obesity, Hypertension, aortic stenosis status post aortic valve replacement with St. Jude mechanical prosthesis, prior left subdural hematoma requiring evacuation via twist drill craniotomy in May 2006 secondary to INR of 2.2., systemic anticoagulation with INR goal of 2.0-2.5 secondary history of intracranial bleed - on for her AVR, Beta blocker intolerance, Elevated cholesterol, 4/14 TEE cardioversion success but return to Atrial fibrillation on 07/29/12.        Family History: Father: deceased 82 yrs, elevated cholesterol, hypertension, cancer of esophagus, diagnosed with HTNMother: deceased 71 yrs, lung cancer, hypertension, diagnosed with HTN, Lung CaBrother 1: alive 64 yrs, heart disease, emphysema, defibrilator, diagnosed with CAD, COPDSister 1: alive 64 yrs, Fibromyalgia, hepatitiis in past neg. [name]. hx. colon/breast cancer Denies family history of colon polyps.       Social History:  General:  Tobacco use cigarettes: Never smoked.  no Smoking.  no Alcohol.  Caffeine: yes, coffee, 2 servings daily.  no Recreational drug use.  Exercise: yes, 4 x week, walks when able.  Occupation: [employer], retired.  Marital Status: married.  Children: 1, Boys.        Medications: Taking Centrum Silver Tablet 1 tablet once a  day, Taking Oscal 500/200 D-3 500-200 MG-UNIT Tablet 1 tablets Once a day, Taking Tylenol Extra Strength 500 MG Tablet 2 tablets as needed, Taking Zoloft 100 Milligram Tablet TAKE 1 AND 1/2 TABLETS BY MOUTH ONCE DAILY , Taking Lasix 80 MG Tablet 1 tablet Once a dayand extra as directed, Taking Vitamin D 1000 UNIT Capsule 1 capsule Once a day, Taking Losartan Potassium 50.0 Milligram Tablet TAKE 1 TABLET BY MOUTH DAILY , Taking Klor-Con 10 10 MEQ Tablet Extended Release 2 tablet Once a day, Notes: has only been taking one a day, Taking Diltiazem HCl ER Beads 300 MG Capsule Extended Release 24 Hour 1 capsule Once a day, Taking Coumadin 1 MG 1 MG Tablet 1 tablet to take with 5 MG warfarin when needed, Taking Ambien 10 MG Tablet 1/2-1 tablet at bedtime as needed, Taking Coumadin 5 MG Tablet 5 mg qd except 4 mg Monday and Friday Once a day, Taking Tikosyn 250 MCG Capsule 2 tablets Twice a day, Medication List reviewed and reconciled with the patient       Allergies: Codeine (for allergy): jerking: Allergy, Dilantin: rash: Allergy, Toprol XL: unknown: Side Effects, Beta blockers: depression.           Examination:  Cardiology, General:  GENERAL APPEARANCE: pleasant, NAD.  HEENT: unremarkable.  CAROTID UPSTROKE: normal, no bruit.  JVD: flat.  HEART SOUNDS: regular, normal S1, S2, no S3 or S4.  MURMUR: absent.  LUNGS: no rales or wheezes.  ABDOMEN: soft, non tender, positive bowel sounds, no masses felt.    EXTREMITIES: no leg edema.  PERIPHERAL PULSES: 2 plus bilateral.            Assessment:  1. Atrial fibrillation - 427.31 (Primary)  2. OSA (obstructive sleep apnea) - 327.23  3. Benign essential HTN - 401.1  4. Chronic diastolic (congestive) heart failure - 428.32        1. Atrial fibrillation  Continue Tikosyn Capsule, 250 MCG, 2 tablets, Orally, Twice a day .  LAB: Basic Metabolic LAB: CBC with Diff Notes: She is still in afib so I will set her up for DCCV next week.       2. Benign  essential HTN  Continue Diltiazem HCl ER Beads Capsule Extended Release 24 Hour, 300 MG, 1 capsule, Orally, Once a day ; Continue Losartan Potassium Tablet, 50.0 Milligram, TAKE 1 TABLET BY MOUTH DAILY .  Notes: Patient Educated with: [facility] BP Action plan.pdf ([facility] BP Action plan.pdf).       3. Chronic diastolic (congestive) heart failure  Continue Lasix Tablet, 80 MG, 1 tablet, Orally, Once a dayand extra as directed .       4. Others  Imaging: EKG atrial fibrillation     Stegall,Amy 11/20/2012 11:05:16 AM > Andee Chivers M 11/20/2012 11:17:16 AM >       Procedures:  Venipuncture:  Venipuncture: Howell,Kathleen 11/20/2012 12:05:49 PM > , performed in right arm.           Labs:    Lab: Basic Metabolic  GLUCOSE 89  70-99 - mg/dL  BUN 18  6-26 - mg/dL  CREATININE 0.71  0.60-1.30 - mg/dl  eGFR (NON-AFRICAN AMERICAN) 82  >60 - calc  eGFR (AFRICAN AMERICAN) 99  >60 - calc  SODIUM 140  136-145 - mmol/L  POTASSIUM 3.9  3.5-5.5 - mmol/L  CHLORIDE 100  98-107 - mmol/L  C02 31  22-32 - mmol/L  ANION GAP 13.1  6.0-20.0 - mmol/L  CALCIUM 9.6  8.6-10.3 - mg/dL   Abner Ardis M 11/20/2012 03:17:03 PM > please find out how much potassium patient is taking and update in med list Stegall,Amy 11/20/2012 03:26:01 PM > lmtrc Corson,Danielle 11/20/2012 04:29:25 PM > Pt states she is taking 20 MEQ 1 tablet BID Johndavid Geralds M 11/20/2012 04:31:34 PM > please have her increase potassium to 2 tablets in the am and 1 tablet in the PM and recheck BMET monday 8/18. Corson,Danielle 11/21/2012 09:07:47 AM > Pt is aware. Lab set up for pt.        Lab: CBC with Diff Normal  WBC 7.3  4.0-11.0 - K/ul  RBC 4.47  4.20-5.40 - M/uL  HGB 13.1  12.0-16.0 - g/dL  HCT 38.8  37.0-47.0 - %  MCH 29.2  27.0-33.0 - pg  MPV 9.1  7.5-10.7 - fL  MCV 86.8  81.0-99.0 - fL  MCHC 33.7  32.0-36.0 - g/dL  RDW 16.9 H 11.5-15.5 - %  NRBC# 0.01  -   PLT 283  150-400 - K/uL  NEUT % 57.3  43.3-71.9 - %  NRBC% 0.10  - %  LYMPH%  25.8  16.8-43.5 - %  MONO % 12.7 H 4.6-12.4 - %  EOS % 3.2  0.0-7.8 - %  BASO % 1.0  0.0-1.0 - %  NEUT # 4.2  1.9-7.2 - K/uL  LYMPH# 1.90  1.10-2.70 - K/uL  MONO # 0.9 H 0.3-0.8 - K/uL  EOS # 0.2  0.0-0.6 - K/uL  BASO # 0.1  0.0-0.1 - K/uL   Lorren Rossetti M 11/20/2012 03:17:55 PM > Stegall,Amy 11/20/2012   03:26:14 PM > lmtrc Corson,Danielle 11/21/2012 09:04:34 AM > Pt is aware.          Procedure Codes: 93000 EKG I AND R, 80048 ECL BMP, 85025 ECL CBC PLATELET DIFF, 36415 BLOOD COLLECTION ROUTINE VENIPUNCTURE       Follow Up: DCCV         Provider: Yashua Bracco, MD  Patient: [name], [name] DOB: 09/28/1945 Date: 11/20/2012       

## 2012-11-25 NOTE — Anesthesia Preprocedure Evaluation (Addendum)
Anesthesia Evaluation  Patient identified by MRN, date of birth, ID band Patient awake    Reviewed: Allergy & Precautions, H&P , NPO status , Patient's Chart, lab work & pertinent test results, reviewed documented beta blocker date and time   Airway Mallampati: II TM Distance: >3 FB Neck ROM: Full    Dental  (+) Teeth Intact and Dental Advisory Given   Pulmonary shortness of breath, sleep apnea ,  Diaphragm paralysis breath sounds clear to auscultation        Cardiovascular hypertension, Pt. on medications +CHF + dysrhythmias Atrial Fibrillation Rhythm:Irregular Rate:Normal  S/P AVR   Neuro/Psych Anxiety Depression    GI/Hepatic GERD-  ,  Endo/Other    Renal/GU      Musculoskeletal   Abdominal (+) + obese,   Peds  Hematology   Anesthesia Other Findings   Reproductive/Obstetrics                          Anesthesia Physical Anesthesia Plan  ASA: III  Anesthesia Plan: General   Post-op Pain Management:    Induction: Intravenous  Airway Management Planned: Mask  Additional Equipment:   Intra-op Plan:   Post-operative Plan:   Informed Consent: I have reviewed the patients History and Physical, chart, labs and discussed the procedure including the risks, benefits and alternatives for the proposed anesthesia with the patient or authorized representative who has indicated his/her understanding and acceptance.   Dental advisory given  Plan Discussed with: CRNA, Surgeon and Anesthesiologist  Anesthesia Plan Comments:        Anesthesia Quick Evaluation

## 2012-11-25 NOTE — Interval H&P Note (Signed)
History and Physical Interval Note:  11/25/2012 11:03 AM  [name]  has presented today for surgery, with the diagnosis of a fib  The various methods of treatment have been discussed with the patient and family. After consideration of risks, benefits and other options for treatment, the patient has consented to  Procedure(s): CARDIOVERSION (N/A) as a surgical intervention .  The patient's history has been reviewed, patient examined, no change in status, stable for surgery.  I have reviewed the patient's chart and labs.  Questions were answered to the patient's satisfaction.     [name]

## 2012-11-26 ENCOUNTER — Encounter (HOSPITAL_COMMUNITY): Payer: Self-pay | Admitting: Cardiology

## 2012-11-26 DIAGNOSIS — E669 Obesity, unspecified: Secondary | ICD-10-CM | POA: Diagnosis not present

## 2012-11-26 DIAGNOSIS — Z7901 Long term (current) use of anticoagulants: Secondary | ICD-10-CM | POA: Diagnosis not present

## 2012-11-26 DIAGNOSIS — I10 Essential (primary) hypertension: Secondary | ICD-10-CM | POA: Diagnosis not present

## 2012-11-26 DIAGNOSIS — E662 Morbid (severe) obesity with alveolar hypoventilation: Secondary | ICD-10-CM | POA: Diagnosis not present

## 2012-11-26 DIAGNOSIS — I4891 Unspecified atrial fibrillation: Secondary | ICD-10-CM | POA: Diagnosis not present

## 2012-11-26 DIAGNOSIS — Z954 Presence of other heart-valve replacement: Secondary | ICD-10-CM | POA: Diagnosis not present

## 2012-11-26 DIAGNOSIS — G4733 Obstructive sleep apnea (adult) (pediatric): Secondary | ICD-10-CM | POA: Diagnosis not present

## 2012-12-04 DIAGNOSIS — Z954 Presence of other heart-valve replacement: Secondary | ICD-10-CM | POA: Diagnosis not present

## 2012-12-04 DIAGNOSIS — Z7901 Long term (current) use of anticoagulants: Secondary | ICD-10-CM | POA: Diagnosis not present

## 2012-12-12 DIAGNOSIS — Z954 Presence of other heart-valve replacement: Secondary | ICD-10-CM | POA: Diagnosis not present

## 2012-12-12 DIAGNOSIS — Z7901 Long term (current) use of anticoagulants: Secondary | ICD-10-CM | POA: Diagnosis not present

## 2012-12-19 DIAGNOSIS — Z954 Presence of other heart-valve replacement: Secondary | ICD-10-CM | POA: Diagnosis not present

## 2012-12-19 DIAGNOSIS — Z7901 Long term (current) use of anticoagulants: Secondary | ICD-10-CM | POA: Diagnosis not present

## 2012-12-25 DIAGNOSIS — Z954 Presence of other heart-valve replacement: Secondary | ICD-10-CM | POA: Diagnosis not present

## 2012-12-25 DIAGNOSIS — I4891 Unspecified atrial fibrillation: Secondary | ICD-10-CM | POA: Diagnosis not present

## 2012-12-25 DIAGNOSIS — I10 Essential (primary) hypertension: Secondary | ICD-10-CM | POA: Diagnosis not present

## 2012-12-25 DIAGNOSIS — Z7901 Long term (current) use of anticoagulants: Secondary | ICD-10-CM | POA: Diagnosis not present

## 2012-12-25 DIAGNOSIS — I5032 Chronic diastolic (congestive) heart failure: Secondary | ICD-10-CM | POA: Diagnosis not present

## 2012-12-26 ENCOUNTER — Other Ambulatory Visit: Payer: Self-pay | Admitting: Cardiology

## 2012-12-29 ENCOUNTER — Other Ambulatory Visit: Payer: Self-pay | Admitting: Cardiology

## 2012-12-29 NOTE — H&P (Signed)
--------------------------------------------------------------------------------  Noblett, [name]  67 Y  old  Female, DOB: 12/04/1945  Account Number: [id number]  [address], , [place]  Home: [phone]      Guarantor: Quesenberry, Jamisyn [name]    Insurance: MEDICARE Payer ID: [id number]  PCP: CAROLA WESTERMANN  Appointment Facility:  [facility]   -------------------------------------------------------------------------------- 12/25/2012 Office Visit:  Dominique Ressel, MD    Current Medications  Taking Centrum Silver Tablet 1 tablet once a day  Taking Oscal 500/200 D-3 500-200 MG-UNIT Tablet 1 tablets Once a day  Taking Tylenol Extra Strength 500 MG Tablet 2 tablets as needed  Taking Zoloft 100 Milligram Tablet TAKE 1 AND 1/2 TABLETS BY MOUTH ONCE DAILY   Taking Lasix 80 MG Tablet 1 tablet Once a dayand extra as directed  Taking Vitamin D 1000 UNIT Capsule 1 capsule Once a day  Taking Losartan Potassium 50.0 Milligram Tablet TAKE 1 TABLET BY MOUTH DAILY   Taking Ambien 10 MG Tablet 1/2-1 tablet at bedtime as needed  Taking Tikosyn 250 MCG Capsule 2 capsules Twice a day  Taking Klor-Con 10 10 MEQ Tablet Extended Release 2 tablet in the am 1 tablet in the PM Once a day  Taking Coumadin 5 MG and 1 MG Tablet 5 mg qd Once a day  Unknown Diltiazem HCl ER Beads 300 MG Capsule Extended Release 24 Hour 1 capsule Once a day  Medication List reviewed and reconciled with the patient        Past Medical History  Obesity  Hypertension  aortic stenosis status post aortic valve replacement with St. Jude mechanical prosthesis  prior left subdural hematoma requiring evacuation via twist drill craniotomy in May 2006 secondary to INR of 2.2.  systemic anticoagulation with INR goal of 2.0-2.5 secondary history of intracranial bleed - on for her AVR  Beta blocker intolerance  Elevated cholesterol  4/14 TEE cardioversion success but return to Atrial fibrillation on 07/29/12       Surgical History  status post aortic valve replacement with 21 mm St. Jude Regent valve (Dr. Van Tright) 02/06  status post twist drill craniotomy for evacuation of left subdural hematoma 05/06  knee surgery 1980      Family History  Father: deceased 82 yrs, elevated cholesterol, hypertension, cancer of esophagus, diagnosed with HTN  Mother: deceased 71 yrs, lung cancer, hypertension, diagnosed with HTN, Lung Ca  Brother 1: alive 64 yrs, heart disease, emphysema, defibrilator, diagnosed with CAD, COPD  Sister 1: alive 64 yrs, Fibromyalgia, hepatitiis in past      Social History  General:   Tobacco use  cigarettes: Never smoked.   no Smoking.  no Alcohol.  Caffeine: yes, coffee, 2 servings daily.  no Recreational drug use.  Exercise: yes, 4 x week, walks when able.  Occupation: [employer], retired.  Marital Status: married.  Children: 1, Boys.     Allergies  Codeine (for allergy): jerking: Allergy  Dilantin: rash: Allergy  Toprol XL: unknown: Side Effects  Beta blockers: depression      Review of Systems  See HPI, A twelve system review was perfomed at today's visit. For pertinent positives and negatives see HPI.     Reason for Appointment  1. PER JEREMY & SEE JEREMY/SEE LINDA      History of Present Illness  General:           The patient presents today for followup of her atrial fibrillation s/p drug load with Tikosyn. Unfortunately her INR dropped below 2 on the day   she was supposed to be cardioverted. She is doing well. She denies any chest pain, dizziness or palpitations. She has been having more SOB and LE edema over the past week. She tolerates the Tikosyn well. .     Vital Signs  Wt 230.6, Wt change 5.2 lb, Ht 65, BMI 38.37, Pulse sitting 96, BP sitting 130/72.    Examination  Cardiology, General:        GENERAL APPEARANCE: pleasant, NAD.         HEENT: unremarkable.         CAROTID UPSTROKE: normal, no bruit.         JVD: flat.         HEART SOUNDS:  irreguarly irregularly normal S1, S2, no S3 or S4.         MURMUR: absent.         LUNGS: no rales or wheezes.         ABDOMEN: soft, non tender, positive bowel sounds, no masses felt.         EXTREMITIES: no leg edema.         PERIPHERAL PULSES: 2 plus bilateral.      Assessments  1. Atrial fibrillation - 427.31 (Primary)  2. Benign essential HTN - 401.1  3. Chronic diastolic (congestive) heart failure - 428.32    Treatment  1. Atrial fibrillation   Continue Diltiazem HCl ER Beads Capsule Extended Release 24 Hour, 300 MG, 1 capsule, Orally, Once a day Continue Tikosyn Capsule, 250 MCG, 2 capsules, Orally, Twice a day Continue Coumadin Tablet, 5 MG and 1 MG, 5 mg qd, Orally, Once a day      LAB: Basic Metabolic      LAB: CBC with Diff Notes: Her INR is 2.5 today. She is set up for DCCV next Tuesday. SHe will have an INR done in our clinic that morning before she goes to the hospital.      2. Benign essential HTN   Continue Losartan Potassium Tablet, 50.0 Milligram, TAKE 1 TABLET BY MOUTH DAILY Continue Diltiazem HCl ER Beads Capsule Extended Release 24 Hour, 300 MG, 1 capsule, Orally, Once a day   3. Chronic diastolic (congestive) heart failure   Continue Lasix Tablet, 80 MG, 1 tablet, Orally, Once a dayand extra as directed Continue Diltiazem HCl ER Beads Capsule Extended Release 24 Hour, 300 MG, 1 capsule, Orally, Once a day Notes: Patient Educated with: Heart Failure CardioSmart.pdf (Heart Failure CardioSmart.pdf) Patient Educated with: Heart Failure ExitCare.pdf (Heart Failure ExitCare.pdf).         Procedures  Venipuncture:          Venipuncture:  Smith,Michele 12/25/2012 04:24:29 PM > , performed in right arm.             Labs          Lab: Basic Metabolic   Normal       GLUCOSE 104 H 70-99 - mg/dL       BUN 22  6-26 - mg/dL       CREATININE 0.82  0.60-1.30 - mg/dl       eGFR (NON-AFRICAN AMERICAN) 69  >60 - calc       eGFR (AFRICAN AMERICAN) 84  >60 - calc        SODIUM 140  136-145 - mmol/L       POTASSIUM 3.9  3.5-5.5 - mmol/L       CHLORIDE 103  98-107 - mmol/L       C02 32    22-32 - mmol/L       ANION GAP 9.5  6.0-20.0 - mmol/L       CALCIUM 9.5  8.6-10.3 - mg/dL                Earlin Sweeden M 12/28/2012 01:04:10 PM >               Lab: CBC with Diff   Normal       WBC 8.6  4.0-11.0 - K/ul       RBC 4.10 L 4.20-5.40 - M/uL       HGB 12.4  12.0-16.0 - g/dL       HCT 36.6 L 37.0-47.0 - %       MCH 30.2  27.0-33.0 - pg       MPV 8.9  7.5-10.7 - fL       MCV 89.2  81.0-99.0 - fL       MCHC 33.9  32.0-36.0 - g/dL       RDW 16.9 H 11.5-15.5 - %       NRBC# 0.00  -        PLT 262  150-400 - K/uL       NEUT % 67.5  43.3-71.9 - %       NRBC% 0.00  - %       LYMPH% 20.2  16.8-43.5 - %       MONO % 9.6  4.6-12.4 - %       EOS % 2.4  0.0-7.8 - %       BASO % 0.3  0.0-1.0 - %       NEUT # 5.8  1.9-7.2 - K/uL       LYMPH# 1.70  1.10-2.70 - K/uL       MONO # 0.8  0.3-0.8 - K/uL       EOS # 0.2  0.0-0.6 - K/uL       BASO # 0.0  0.0-0.1 - K/uL                Almond Fitzgibbon M 12/28/2012 01:06:59 PM >            Procedure Codes  80048 ECL BMP  85025 ECL CBC PLATELET DIFF  36415 BLOOD COLLECTION ROUTINE VENIPUNCTURE              Electronically signed by Teylor Wolven , MD on 12/28/2012 at 02:53 PM EDT  Sign off status: Completed     --------------------------------------------------------------------------------  [facility] [address], [address] [place] , [place] [id number] Tel: [phone] Fax: [phone]--------------------------------------------------------------------------------  Hannis, Bernadene [name]  67 Y  old  Female, DOB: 06/14/1945  Account Number: [id number]  [address], [place], [place]  Home: [phone]      Guarantor: Hudock, Shalaine [name]    Insurance: MEDICARE Payer ID: [id number]  PCP: CAROLA WESTERMANN  Appointment Facility:  [facility]    -------------------------------------------------------------------------------- 12/25/2012 Office Visit:  Deejay Koppelman, MD    Current Medications  Taking Centrum Silver Tablet 1 tablet once a day  Taking Oscal 500/200 D-3 500-200 MG-UNIT Tablet 1 tablets Once a day  Taking Tylenol Extra Strength 500 MG Tablet 2 tablets as needed  Taking Zoloft 100 Milligram Tablet TAKE 1 AND 1/2 TABLETS BY MOUTH ONCE DAILY   Taking Lasix 80 MG Tablet 1 tablet Once a dayand extra as directed  Taking Vitamin D 1000 UNIT Capsule 1 capsule Once a day  Taking Losartan Potassium 50.0 Milligram Tablet TAKE 1 TABLET BY   MOUTH DAILY   Taking Ambien 10 MG Tablet 1/2-1 tablet at bedtime as needed  Taking Tikosyn 250 MCG Capsule 2 capsules Twice a day  Taking Klor-Con 10 10 MEQ Tablet Extended Release 2 tablet in the am 1 tablet in the PM Once a day  Taking Coumadin 5 MG and 1 MG Tablet 5 mg qd Once a day  Unknown Diltiazem HCl ER Beads 300 MG Capsule Extended Release 24 Hour 1 capsule Once a day  Medication List reviewed and reconciled with the patient        Past Medical History  Obesity  Hypertension  aortic stenosis status post aortic valve replacement with St. Jude mechanical prosthesis  prior left subdural hematoma requiring evacuation via twist drill craniotomy in May 2006 secondary to INR of 2.2.  systemic anticoagulation with INR goal of 2.0-2.5 secondary history of intracranial bleed - on for her AVR  Beta blocker intolerance  Elevated cholesterol  4/14 TEE cardioversion success but return to Atrial fibrillation on 07/29/12      Surgical History  status post aortic valve replacement with 21 mm St. Jude Regent valve (Dr. Van Tright) 02/06  status post twist drill craniotomy for evacuation of left subdural hematoma 05/06  knee surgery 1980      Family History  Father: deceased 82 yrs, elevated cholesterol, hypertension, cancer of esophagus, diagnosed with HTN  Mother: deceased 71 yrs, lung  cancer, hypertension, diagnosed with HTN, Lung Ca  Brother 1: alive 64 yrs, heart disease, emphysema, defibrilator, diagnosed with CAD, COPD  Sister 1: alive 64 yrs, Fibromyalgia, hepatitiis in past      Social History  General:   Tobacco use  cigarettes: Never smoked.   no Smoking.  no Alcohol.  Caffeine: yes, coffee, 2 servings daily.  no Recreational drug use.  Exercise: yes, 4 x week, walks when able.  Occupation: [employer], retired.  Marital Status: married.  Children: 1, Boys.     Allergies  Codeine (for allergy): jerking: Allergy  Dilantin: rash: Allergy  Toprol XL: unknown: Side Effects  Beta blockers: depression      Review of Systems  See HPI, A twelve system review was perfomed at today's visit. For pertinent positives and negatives see HPI.     Reason for Appointment  1. PER JEREMY & SEE JEREMY/SEE LINDA      History of Present Illness  General:           The patient presents today for followup of her atrial fibrillation s/p drug load with Tikosyn. Unfortunately her INR dropped below 2 on the day she was supposed to be cardioverted. She is doing well. She denies any chest pain, dizziness or palpitations. She has been having more SOB and LE edema over the past week. She tolerates the Tikosyn well. .     Vital Signs  Wt 230.6, Wt change 5.2 lb, Ht 65, BMI 38.37, Pulse sitting 96, BP sitting 130/72.    Examination  Cardiology, General:        GENERAL APPEARANCE: pleasant, NAD.         HEENT: unremarkable.         CAROTID UPSTROKE: normal, no bruit.         JVD: flat.         HEART SOUNDS: irreguarly irregularly normal S1, S2, no S3 or S4.         MURMUR: absent.         LUNGS: no rales or wheezes.           ABDOMEN: soft, non tender, positive bowel sounds, no masses felt.         EXTREMITIES: no leg edema.         PERIPHERAL PULSES: 2 plus bilateral.      Assessments  1. Atrial fibrillation - 427.31 (Primary)  2. Benign essential HTN - 401.1  3.  Chronic diastolic (congestive) heart failure - 428.32    Treatment  1. Atrial fibrillation   Continue Diltiazem HCl ER Beads Capsule Extended Release 24 Hour, 300 MG, 1 capsule, Orally, Once a day Continue Tikosyn Capsule, 250 MCG, 2 capsules, Orally, Twice a day Continue Coumadin Tablet, 5 MG and 1 MG, 5 mg qd, Orally, Once a day      LAB: Basic Metabolic      LAB: CBC with Diff Notes: Her INR is 2.5 today. She is set up for DCCV next Tuesday. SHe will have an INR done in our clinic that morning before she goes to the hospital.      2. Benign essential HTN   Continue Losartan Potassium Tablet, 50.0 Milligram, TAKE 1 TABLET BY MOUTH DAILY Continue Diltiazem HCl ER Beads Capsule Extended Release 24 Hour, 300 MG, 1 capsule, Orally, Once a day   3. Chronic diastolic (congestive) heart failure   Continue Lasix Tablet, 80 MG, 1 tablet, Orally, Once a dayand extra as directed Continue Diltiazem HCl ER Beads Capsule Extended Release 24 Hour, 300 MG, 1 capsule, Orally, Once a day Notes: Patient Educated with: Heart Failure CardioSmart.pdf (Heart Failure CardioSmart.pdf) Patient Educated with: Heart Failure ExitCare.pdf (Heart Failure ExitCare.pdf).         Procedures  Venipuncture:          Venipuncture:  Smith,Michele 12/25/2012 04:24:29 PM > , performed in right arm.             Labs          Lab: Basic Metabolic   Normal       GLUCOSE 104 H 70-99 - mg/dL       BUN 22  6-26 - mg/dL       CREATININE 0.82  0.60-1.30 - mg/dl       eGFR (NON-AFRICAN AMERICAN) 69  >60 - calc       eGFR (AFRICAN AMERICAN) 84  >60 - calc       SODIUM 140  136-145 - mmol/L       POTASSIUM 3.9  3.5-5.5 - mmol/L       CHLORIDE 103  98-107 - mmol/L       C02 32  22-32 - mmol/L       ANION GAP 9.5  6.0-20.0 - mmol/L       CALCIUM 9.5  8.6-10.3 - mg/dL                Loan Oguin M 12/28/2012 01:04:10 PM >               Lab: CBC with Diff   Normal       WBC 8.6  4.0-11.0 - K/ul       RBC 4.10 L  4.20-5.40 - M/uL       HGB 12.4  12.0-16.0 - g/dL       HCT 36.6 L 37.0-47.0 - %       MCH 30.2  27.0-33.0 - pg       MPV 8.9  7.5-10.7 - fL       MCV 89.2  81.0-99.0 - fL       MCHC   33.9  32.0-36.0 - g/dL       RDW 16.9 H 11.5-15.5 - %       NRBC# 0.00  -        PLT 262  150-400 - K/uL       NEUT % 67.5  43.3-71.9 - %       NRBC% 0.00  - %       LYMPH% 20.2  16.8-43.5 - %       MONO % 9.6  4.6-12.4 - %       EOS % 2.4  0.0-7.8 - %       BASO % 0.3  0.0-1.0 - %       NEUT # 5.8  1.9-7.2 - K/uL       LYMPH# 1.70  1.10-2.70 - K/uL       MONO # 0.8  0.3-0.8 - K/uL       EOS # 0.2  0.0-0.6 - K/uL       BASO # 0.0  0.0-0.1 - K/uL                Kerrigan Gombos M 12/28/2012 01:06:59 PM >            Procedure Codes  80048 ECL BMP  85025 ECL CBC PLATELET DIFF  36415 BLOOD COLLECTION ROUTINE VENIPUNCTURE         

## 2012-12-30 ENCOUNTER — Ambulatory Visit (HOSPITAL_COMMUNITY)
Admission: RE | Admit: 2012-12-30 | Discharge: 2012-12-30 | Disposition: A | Discharge status: Home / Self Care | Payer: Medicare Other | Source: Ambulatory Visit | Attending: Cardiology | Admitting: Cardiology

## 2012-12-30 ENCOUNTER — Ambulatory Visit (HOSPITAL_COMMUNITY): Payer: Medicare Other | Admitting: *Deleted

## 2012-12-30 ENCOUNTER — Encounter (HOSPITAL_COMMUNITY)
Admission: RE | Disposition: A | Discharge status: Home / Self Care | Payer: Self-pay | Source: Ambulatory Visit | Attending: Cardiology

## 2012-12-30 ENCOUNTER — Encounter (HOSPITAL_COMMUNITY): Payer: Self-pay | Admitting: *Deleted

## 2012-12-30 ENCOUNTER — Encounter (HOSPITAL_COMMUNITY): Payer: Self-pay

## 2012-12-30 DIAGNOSIS — E669 Obesity, unspecified: Secondary | ICD-10-CM | POA: Insufficient documentation

## 2012-12-30 DIAGNOSIS — I4891 Unspecified atrial fibrillation: Secondary | ICD-10-CM | POA: Insufficient documentation

## 2012-12-30 DIAGNOSIS — E7800 Pure hypercholesterolemia, unspecified: Secondary | ICD-10-CM | POA: Diagnosis not present

## 2012-12-30 DIAGNOSIS — I10 Essential (primary) hypertension: Secondary | ICD-10-CM | POA: Insufficient documentation

## 2012-12-30 DIAGNOSIS — Z954 Presence of other heart-valve replacement: Secondary | ICD-10-CM | POA: Diagnosis not present

## 2012-12-30 DIAGNOSIS — Z7901 Long term (current) use of anticoagulants: Secondary | ICD-10-CM | POA: Diagnosis not present

## 2012-12-30 HISTORY — PX: CARDIOVERSION: SHX1299

## 2012-12-30 SURGERY — CARDIOVERSION
Anesthesia: Monitor Anesthesia Care

## 2012-12-30 MED ORDER — PROPOFOL 10 MG/ML IV BOLUS
INTRAVENOUS | Status: DC | PRN
  Administered 2012-12-30: 100 mg via INTRAVENOUS

## 2012-12-30 MED ORDER — SODIUM CHLORIDE 0.9 % IV SOLN
INTRAVENOUS | Status: DC | PRN
  Administered 2012-12-30: 10:00:00 via INTRAVENOUS

## 2012-12-30 MED ORDER — SODIUM CHLORIDE 0.9 % IV SOLN
INTRAVENOUS | Status: DC
  Administered 2012-12-30: 500 mL via INTRAVENOUS

## 2012-12-30 NOTE — Interval H&P Note (Signed)
History and Physical Interval Note:  12/30/2012 10:46 AM  [name]  has presented today for surgery, with the diagnosis of AFIB  The various methods of treatment have been discussed with the patient and family. After consideration of risks, benefits and other options for treatment, the patient has consented to  Procedure(s) with comments: CARDIOVERSION (N/A) - h&p in file-HW  as a surgical intervention .  The patient's history has been reviewed, patient examined, no change in status, stable for surgery.  I have reviewed the patient's chart and labs.  Questions were answered to the patient's satisfaction.     Crissa Sowder R

## 2012-12-30 NOTE — H&P (View-Only) (Signed)
--------------------------------------------------------------------------------  [name]  43 Y  old  Female, DOB: July 05, 1945  Account Number: [id number]  [address] [place], [place]  Home: [phone]      Guarantor: [place], [place]    Insurance: MEDICARE Payer ID: [id number]  PCP: Carolin Coy  Appointment Facility:  [facility]   -------------------------------------------------------------------------------- 12/25/2012 Office Visit:  Armanda Magic, MD    Current Medications  Taking Centrum Silver Tablet 1 tablet once a day  Taking Oscal 500/200 D-3 500-200 MG-UNIT Tablet 1 tablets Once a day  Taking Tylenol Extra Strength 500 MG Tablet 2 tablets as needed  Taking Zoloft 100 Milligram Tablet TAKE 1 AND 1/2 TABLETS BY MOUTH ONCE DAILY   Taking Lasix 80 MG Tablet 1 tablet Once a dayand extra as directed  Taking Vitamin D 1000 UNIT Capsule 1 capsule Once a day  Taking Losartan Potassium 50.0 Milligram Tablet TAKE 1 TABLET BY MOUTH DAILY   Taking Ambien 10 MG Tablet 1/2-1 tablet at bedtime as needed  Taking Tikosyn 250 MCG Capsule 2 capsules Twice a day  Taking Klor-Con 10 10 MEQ Tablet Extended Release 2 tablet in the am 1 tablet in the PM Once a day  Taking Coumadin 5 MG and 1 MG Tablet 5 mg qd Once a day  Unknown Diltiazem HCl ER Beads 300 MG Capsule Extended Release 24 Hour 1 capsule Once a day  Medication List reviewed and reconciled with the patient        Past Medical History  Obesity  Hypertension  aortic stenosis status post aortic valve replacement with St. Jude mechanical prosthesis  prior left subdural hematoma requiring evacuation via twist drill craniotomy in May 2006 secondary to INR of 2.2.  systemic anticoagulation with INR goal of 2.0-2.5 secondary history of intracranial bleed - on for her AVR  Beta blocker intolerance  Elevated cholesterol  4/14 TEE cardioversion success but return to Atrial fibrillation on 07/29/12       Surgical History  status post aortic valve replacement with 21 mm St. Jude Regent valve (Dr. Zenaida Niece Tright) 02/06  status post twist drill craniotomy for evacuation of left subdural hematoma 05/06  knee surgery 1980      Family History  Father: deceased 64 yrs, elevated cholesterol, hypertension, cancer of esophagus, diagnosed with HTN  Mother: deceased 3 yrs, lung cancer, hypertension, diagnosed with HTN, Lung Ca  Brother 1: alive 51 yrs, heart disease, emphysema, defibrilator, diagnosed with CAD, COPD  Sister 1: alive 57 yrs, Fibromyalgia, hepatitiis in past      Social History  General:   Tobacco use  cigarettes: Never smoked.   no Smoking.  no Alcohol.  Caffeine: yes, coffee, 2 servings daily.  no Recreational drug use.  Exercise: yes, 4 x week, walks when able.  Occupation: [employer], retired.  Marital Status: married.  Children: 1, Boys.     Allergies  Codeine (for allergy): jerking: Allergy  Dilantin: rash: Allergy  Toprol XL: unknown: Side Effects  Beta blockers: depression      Review of Systems  See HPI, A twelve system review was perfomed at today's visit. For pertinent positives and negatives see HPI.     Reason for Appointment  1. PER JEREMY & SEE JEREMY/SEE LINDA      History of Present Illness  General:           The patient presents today for followup of her atrial fibrillation s/p drug load with Tikosyn. Unfortunately her INR dropped below 2 on the day  she was supposed to be cardioverted. She is doing well. She denies any chest pain, dizziness or palpitations. She has been having more SOB and LE edema over the past week. She tolerates the Tikosyn well. .     Vital Signs  Wt 230.6, Wt change 5.2 lb, Ht 65, BMI 38.37, Pulse sitting 96, BP sitting 130/72.    Examination  Cardiology, General:        GENERAL APPEARANCE: pleasant, NAD.         HEENT: unremarkable.         CAROTID UPSTROKE: normal, no bruit.         JVD: flat.         HEART SOUNDS:  irreguarly irregularly normal S1, S2, no S3 or S4.         MURMUR: absent.         LUNGS: no rales or wheezes.         ABDOMEN: soft, non tender, positive bowel sounds, no masses felt.         EXTREMITIES: no leg edema.         PERIPHERAL PULSES: 2 plus bilateral.      Assessments  1. Atrial fibrillation - 427.31 (Primary)  2. Benign essential HTN - 401.1  3. Chronic diastolic (congestive) heart failure - 428.32    Treatment  1. Atrial fibrillation   Continue Diltiazem HCl ER Beads Capsule Extended Release 24 Hour, 300 MG, 1 capsule, Orally, Once a day Continue Tikosyn Capsule, 250 MCG, 2 capsules, Orally, Twice a day Continue Coumadin Tablet, 5 MG and 1 MG, 5 mg qd, Orally, Once a day      LAB: Basic Metabolic      LAB: CBC with Diff Notes: Her INR is 2.5 today. She is set up for DCCV next Tuesday. SHe will have an INR done in our clinic that morning before she goes to the hospital.      2. Benign essential HTN   Continue Losartan Potassium Tablet, 50.0 Milligram, TAKE 1 TABLET BY MOUTH DAILY Continue Diltiazem HCl ER Beads Capsule Extended Release 24 Hour, 300 MG, 1 capsule, Orally, Once a day   3. Chronic diastolic (congestive) heart failure   Continue Lasix Tablet, 80 MG, 1 tablet, Orally, Once a dayand extra as directed Continue Diltiazem HCl ER Beads Capsule Extended Release 24 Hour, 300 MG, 1 capsule, Orally, Once a day Notes: Patient Educated with: Heart Failure CardioSmart.pdf (Heart Failure CardioSmart.pdf) Patient Educated with: Heart Failure ExitCare.pdf (Heart Failure ExitCare.pdf).         Procedures  Venipuncture:          Venipuncture:  Smith,Michele 12/25/2012 04:24:29 PM > , performed in right arm.             Labs          Lab: Basic Metabolic   Normal       GLUCOSE 104 H 70-99 - mg/dL       BUN 22  1-61 - mg/dL       CREATININE 0.96  0.60-1.30 - mg/dl       eGFR (NON-AFRICAN AMERICAN) 69  >60 - calc       eGFR (AFRICAN AMERICAN) 84  >60 - calc        SODIUM 140  136-145 - mmol/L       POTASSIUM 3.9  3.5-5.5 - mmol/L       CHLORIDE 103  98-107 - mmol/L       C02 32  22-32 - mmol/L       ANION GAP 9.5  6.0-20.0 - mmol/L       CALCIUM 9.5  8.6-10.3 - mg/dL                Wilferd Ritson M 12/28/2012 01:04:10 PM >               Lab: CBC with Diff   Normal       WBC 8.6  4.0-11.0 - K/ul       RBC 4.10 L 4.20-5.40 - M/uL       HGB 12.4  12.0-16.0 - g/dL       HCT 21.3 L 08.6-57.8 - %       MCH 30.2  27.0-33.0 - pg       MPV 8.9  7.5-10.7 - fL       MCV 89.2  81.0-99.0 - fL       MCHC 33.9  32.0-36.0 - g/dL       RDW 46.9 H 62.9-52.8 - %       NRBC# 0.00  -        PLT 262  150-400 - K/uL       NEUT % 67.5  43.3-71.9 - %       NRBC% 0.00  - %       LYMPH% 20.2  16.8-43.5 - %       MONO % 9.6  4.6-12.4 - %       EOS % 2.4  0.0-7.8 - %       BASO % 0.3  0.0-1.0 - %       NEUT # 5.8  1.9-7.2 - K/uL       LYMPH# 1.70  1.10-2.70 - K/uL       MONO # 0.8  0.3-0.8 - K/uL       EOS # 0.2  0.0-0.6 - K/uL       BASO # 0.0  0.0-0.1 - K/uL                Jakyri Brunkhorst M 12/28/2012 01:06:59 PM >            Procedure Codes  41324 ECL BMP  85025 ECL CBC PLATELET DIFF  40102 BLOOD COLLECTION ROUTINE VENIPUNCTURE              Electronically signed by Armanda Magic , MD on 12/28/2012 at 02:53 PM EDT  Sign off status: Completed     --------------------------------------------------------------------------------  [facility] [address] [place], [address] [place] , [place] [id number] Tel: [phone] Fax: [phone]--------------------------------------------------------------------------------  [name]  70 Y  old  Female, DOB: Oct 30, 1945  Account Number: [id number]  [address] [place], [place]  Home: [phone]      Guarantor: Darline, Faith    Insurance: MEDICARE Payer ID: [id number]  PCP: Carolin Coy  Appointment Facility:  [facility]    -------------------------------------------------------------------------------- 12/25/2012 Office Visit:  Armanda Magic, MD    Current Medications  Taking Centrum Silver Tablet 1 tablet once a day  Taking Oscal 500/200 D-3 500-200 MG-UNIT Tablet 1 tablets Once a day  Taking Tylenol Extra Strength 500 MG Tablet 2 tablets as needed  Taking Zoloft 100 Milligram Tablet TAKE 1 AND 1/2 TABLETS BY MOUTH ONCE DAILY   Taking Lasix 80 MG Tablet 1 tablet Once a dayand extra as directed  Taking Vitamin D 1000 UNIT Capsule 1 capsule Once a day  Taking Losartan Potassium 50.0 Milligram Tablet TAKE 1 TABLET BY  MOUTH DAILY   Taking Ambien 10 MG Tablet 1/2-1 tablet at bedtime as needed  Taking Tikosyn 250 MCG Capsule 2 capsules Twice a day  Taking Klor-Con 10 10 MEQ Tablet Extended Release 2 tablet in the am 1 tablet in the PM Once a day  Taking Coumadin 5 MG and 1 MG Tablet 5 mg qd Once a day  Unknown Diltiazem HCl ER Beads 300 MG Capsule Extended Release 24 Hour 1 capsule Once a day  Medication List reviewed and reconciled with the patient        Past Medical History  Obesity  Hypertension  aortic stenosis status post aortic valve replacement with St. Jude mechanical prosthesis  prior left subdural hematoma requiring evacuation via twist drill craniotomy in May 2006 secondary to INR of 2.2.  systemic anticoagulation with INR goal of 2.0-2.5 secondary history of intracranial bleed - on for her AVR  Beta blocker intolerance  Elevated cholesterol  4/14 TEE cardioversion success but return to Atrial fibrillation on 07/29/12      Surgical History  status post aortic valve replacement with 21 mm St. Jude Regent valve (Dr. Zenaida Niece Tright) 02/06  status post twist drill craniotomy for evacuation of left subdural hematoma 05/06  knee surgery 1980      Family History  Father: deceased 65 yrs, elevated cholesterol, hypertension, cancer of esophagus, diagnosed with HTN  Mother: deceased 64 yrs, lung  cancer, hypertension, diagnosed with HTN, Lung Ca  Brother 1: alive 28 yrs, heart disease, emphysema, defibrilator, diagnosed with CAD, COPD  Sister 1: alive 60 yrs, Fibromyalgia, hepatitiis in past      Social History  General:   Tobacco use  cigarettes: Never smoked.   no Smoking.  no Alcohol.  Caffeine: yes, coffee, 2 servings daily.  no Recreational drug use.  Exercise: yes, 4 x week, walks when able.  Occupation: [employer], retired.  Marital Status: married.  Children: 1, Boys.     Allergies  Codeine (for allergy): jerking: Allergy  Dilantin: rash: Allergy  Toprol XL: unknown: Side Effects  Beta blockers: depression      Review of Systems  See HPI, A twelve system review was perfomed at today's visit. For pertinent positives and negatives see HPI.     Reason for Appointment  1. PER JEREMY & SEE JEREMY/SEE LINDA      History of Present Illness  General:           The patient presents today for followup of her atrial fibrillation s/p drug load with Tikosyn. Unfortunately her INR dropped below 2 on the day she was supposed to be cardioverted. She is doing well. She denies any chest pain, dizziness or palpitations. She has been having more SOB and LE edema over the past week. She tolerates the Tikosyn well. .     Vital Signs  Wt 230.6, Wt change 5.2 lb, Ht 65, BMI 38.37, Pulse sitting 96, BP sitting 130/72.    Examination  Cardiology, General:        GENERAL APPEARANCE: pleasant, NAD.         HEENT: unremarkable.         CAROTID UPSTROKE: normal, no bruit.         JVD: flat.         HEART SOUNDS: irreguarly irregularly normal S1, S2, no S3 or S4.         MURMUR: absent.         LUNGS: no rales or wheezes.  ABDOMEN: soft, non tender, positive bowel sounds, no masses felt.         EXTREMITIES: no leg edema.         PERIPHERAL PULSES: 2 plus bilateral.      Assessments  1. Atrial fibrillation - 427.31 (Primary)  2. Benign essential HTN - 401.1  3.  Chronic diastolic (congestive) heart failure - 428.32    Treatment  1. Atrial fibrillation   Continue Diltiazem HCl ER Beads Capsule Extended Release 24 Hour, 300 MG, 1 capsule, Orally, Once a day Continue Tikosyn Capsule, 250 MCG, 2 capsules, Orally, Twice a day Continue Coumadin Tablet, 5 MG and 1 MG, 5 mg qd, Orally, Once a day      LAB: Basic Metabolic      LAB: CBC with Diff Notes: Her INR is 2.5 today. She is set up for DCCV next Tuesday. SHe will have an INR done in our clinic that morning before she goes to the hospital.      2. Benign essential HTN   Continue Losartan Potassium Tablet, 50.0 Milligram, TAKE 1 TABLET BY MOUTH DAILY Continue Diltiazem HCl ER Beads Capsule Extended Release 24 Hour, 300 MG, 1 capsule, Orally, Once a day   3. Chronic diastolic (congestive) heart failure   Continue Lasix Tablet, 80 MG, 1 tablet, Orally, Once a dayand extra as directed Continue Diltiazem HCl ER Beads Capsule Extended Release 24 Hour, 300 MG, 1 capsule, Orally, Once a day Notes: Patient Educated with: Heart Failure CardioSmart.pdf (Heart Failure CardioSmart.pdf) Patient Educated with: Heart Failure ExitCare.pdf (Heart Failure ExitCare.pdf).         Procedures  Venipuncture:          Venipuncture:  Smith,Michele 12/25/2012 04:24:29 PM > , performed in right arm.             Labs          Lab: Basic Metabolic   Normal       GLUCOSE 104 H 70-99 - mg/dL       BUN 22  1-61 - mg/dL       CREATININE 0.96  0.60-1.30 - mg/dl       eGFR (NON-AFRICAN AMERICAN) 69  >60 - calc       eGFR (AFRICAN AMERICAN) 84  >60 - calc       SODIUM 140  136-145 - mmol/L       POTASSIUM 3.9  3.5-5.5 - mmol/L       CHLORIDE 103  98-107 - mmol/L       C02 32  22-32 - mmol/L       ANION GAP 9.5  6.0-20.0 - mmol/L       CALCIUM 9.5  8.6-10.3 - mg/dL                Martisha Toulouse M 12/28/2012 01:04:10 PM >               Lab: CBC with Diff   Normal       WBC 8.6  4.0-11.0 - K/ul       RBC 4.10 L  4.20-5.40 - M/uL       HGB 12.4  12.0-16.0 - g/dL       HCT 04.5 L 40.9-81.1 - %       MCH 30.2  27.0-33.0 - pg       MPV 8.9  7.5-10.7 - fL       MCV 89.2  81.0-99.0 - fL       MCHC  33.9  32.0-36.0 - g/dL       RDW 09.8 H 11.9-14.7 - %       NRBC# 0.00  -        PLT 262  150-400 - K/uL       NEUT % 67.5  43.3-71.9 - %       NRBC% 0.00  - %       LYMPH% 20.2  16.8-43.5 - %       MONO % 9.6  4.6-12.4 - %       EOS % 2.4  0.0-7.8 - %       BASO % 0.3  0.0-1.0 - %       NEUT # 5.8  1.9-7.2 - K/uL       LYMPH# 1.70  1.10-2.70 - K/uL       MONO # 0.8  0.3-0.8 - K/uL       EOS # 0.2  0.0-0.6 - K/uL       BASO # 0.0  0.0-0.1 - K/uL                Abrey Bradway M 12/28/2012 01:06:59 PM >            Procedure Codes  82956 ECL BMP  85025 ECL CBC PLATELET DIFF  36415 BLOOD COLLECTION ROUTINE VENIPUNCTURE

## 2012-12-30 NOTE — Anesthesia Preprocedure Evaluation (Addendum)
Anesthesia Evaluation  Patient identified by MRN, date of birth, ID band Patient awake    Reviewed: Allergy & Precautions, H&P , NPO status , Patient's Chart, lab work & pertinent test results, reviewed documented beta blocker date and time   Airway Mallampati: I TM Distance: >3 FB Neck ROM: Full    Dental  (+) Teeth Intact   Pulmonary          Cardiovascular hypertension, Pt. on medications + dysrhythmias Atrial Fibrillation     Neuro/Psych    GI/Hepatic GERD-  Medicated and Controlled,  Endo/Other    Renal/GU      Musculoskeletal   Abdominal   Peds  Hematology   Anesthesia Other Findings   Reproductive/Obstetrics                           Anesthesia Physical Anesthesia Plan  ASA: III  Anesthesia Plan: General   Post-op Pain Management:    Induction: Intravenous  Airway Management Planned: Mask  Additional Equipment:   Intra-op Plan:   Post-operative Plan:   Informed Consent: I have reviewed the patients History and Physical, chart, labs and discussed the procedure including the risks, benefits and alternatives for the proposed anesthesia with the patient or authorized representative who has indicated his/her understanding and acceptance.     Plan Discussed with: CRNA and Surgeon  Anesthesia Plan Comments:        Anesthesia Quick Evaluation

## 2012-12-30 NOTE — Transfer of Care (Signed)
Immediate Anesthesia Transfer of Care Note  Patient: [name]  Procedure(s) Performed: Procedure(s) with comments: CARDIOVERSION (N/A) - h&p in file-HW   Patient Location: PACU  Anesthesia Type:General  Level of Consciousness: awake, alert , oriented and patient cooperative  Airway & Oxygen Therapy: Patient Spontanous Breathing and Patient connected to nasal cannula oxygen  Post-op Assessment: Report given to PACU RN, Post -op Vital signs reviewed and stable and Patient moving all extremities  Post vital signs: Reviewed and stable  Complications: No apparent anesthesia complications

## 2012-12-30 NOTE — Preoperative (Signed)
Beta Blockers   Reason not to administer Beta Blockers:Not Applicable 

## 2012-12-30 NOTE — CV Procedure (Addendum)
Electrical Cardioversion Procedure Note SKYA MCCULLUM [id number] Aug 18, 1945  Procedure: Electrical Cardioversion Indications:  Atrial Fibrillation  Time Out: Verified patient identification, verified procedure,medications/allergies/relevent history reviewed, required imaging and test results available.  Performed  Procedure Details  The patient was NPO after midnight. Anesthesia was administered at the beside  by [facility] with 100mg  of propofol.  Cardioversion was done with synchronized biphasic defibrillation with AP pads with 150watts.  The patient converted to normal sinus rhythm. The patient tolerated the procedure well   IMPRESSION:  Successful cardioversion of atrial fibrillation Shortly after cardioversion she went back into atrial fibrillation.  I will refer her to EP for further evaluation.    TURNER,TRACI R 12/30/2012, 10:48 AM

## 2012-12-30 NOTE — Anesthesia Postprocedure Evaluation (Signed)
  Anesthesia Post-op Note  Patient: [name]  Procedure(s) Performed: Procedure(s) with comments: CARDIOVERSION (N/A) - h&p in file-HW   Patient Location: PACU  Anesthesia Type:General  Level of Consciousness: awake, alert , oriented and patient cooperative  Airway and Oxygen Therapy: Patient Spontanous Breathing and Patient connected to nasal cannula oxygen  Post-op Pain: none  Post-op Assessment: Post-op Vital signs reviewed, Patient's Cardiovascular Status Stable, Respiratory Function Stable, Patent Airway and No signs of Nausea or vomiting  Post-op Vital Signs: Reviewed and stable  Complications: No apparent anesthesia complications

## 2012-12-31 ENCOUNTER — Encounter (HOSPITAL_COMMUNITY): Payer: Self-pay | Admitting: Cardiology

## 2013-01-02 DIAGNOSIS — M7120 Synovial cyst of popliteal space [Baker], unspecified knee: Secondary | ICD-10-CM | POA: Diagnosis not present

## 2013-01-02 DIAGNOSIS — IMO0002 Reserved for concepts with insufficient information to code with codable children: Secondary | ICD-10-CM | POA: Diagnosis not present

## 2013-01-10 ENCOUNTER — Encounter: Payer: Self-pay | Admitting: *Deleted

## 2013-01-15 ENCOUNTER — Ambulatory Visit (INDEPENDENT_AMBULATORY_CARE_PROVIDER_SITE_OTHER): Payer: Medicare Other | Admitting: Cardiology

## 2013-01-15 ENCOUNTER — Encounter: Payer: Self-pay | Admitting: Cardiology

## 2013-01-15 ENCOUNTER — Ambulatory Visit (INDEPENDENT_AMBULATORY_CARE_PROVIDER_SITE_OTHER): Payer: Medicare Other | Admitting: Pharmacist

## 2013-01-15 VITALS — BP 112/68 | HR 88 | Ht 67.0 in | Wt 231.0 lb

## 2013-01-15 DIAGNOSIS — Z7901 Long term (current) use of anticoagulants: Secondary | ICD-10-CM

## 2013-01-15 DIAGNOSIS — I350 Nonrheumatic aortic (valve) stenosis: Secondary | ICD-10-CM

## 2013-01-15 DIAGNOSIS — I4891 Unspecified atrial fibrillation: Secondary | ICD-10-CM

## 2013-01-15 DIAGNOSIS — Z954 Presence of other heart-valve replacement: Secondary | ICD-10-CM

## 2013-01-15 DIAGNOSIS — I10 Essential (primary) hypertension: Secondary | ICD-10-CM

## 2013-01-15 DIAGNOSIS — I5032 Chronic diastolic (congestive) heart failure: Secondary | ICD-10-CM

## 2013-01-15 DIAGNOSIS — I359 Nonrheumatic aortic valve disorder, unspecified: Secondary | ICD-10-CM | POA: Diagnosis not present

## 2013-01-15 DIAGNOSIS — I509 Heart failure, unspecified: Secondary | ICD-10-CM

## 2013-01-15 DIAGNOSIS — Z952 Presence of prosthetic heart valve: Secondary | ICD-10-CM | POA: Insufficient documentation

## 2013-01-15 LAB — POCT INR: INR: 1.8

## 2013-01-15 NOTE — Patient Instructions (Signed)
Your physician recommends that you continue on your current medications as directed. Please refer to the Current Medication list given to you today.  Your physician recommends that you schedule a follow-up appointment in: 2 months with Dr Turner   

## 2013-01-15 NOTE — Progress Notes (Signed)
[address] [place], [place]  [address] Phone: [phone] Fax:  [phone]  Date:  01/15/2013   ID:  [name], DOB 1946-02-08, MRN [id number]  PCP:  Elie Confer, MD  Cardiologist:  Armanda Magic, MD    History of Present Illness: [name] is a 67 y.o. female with a history of AS s/p mechanical AVR, chronic anticoagulation, HTN,  chronic diastolic CHF, OSA on CPAP and atrial fibrillation.  She was recently cardioverted to NSR on Tikosyn but then reverted back to NSR.  She now presents for followup.  She has been having more DOE but no LE edema.  SHe says she gets SOB with minimal exertion.  She denies any chest pain, palpitations, dizziness or syncope.     Wt Readings from Last 3 Encounters:  01/15/13 231 lb (104.781 kg)  12/30/12 225 lb (102.059 kg)  12/30/12 225 lb (102.059 kg)     Past Medical History  Diagnosis Date  . Obesity   . Hypertension   . Aortic stenosis     status post aortic valve replacement with St. Jude mechanical prosthesis  . Subdural hematoma     in setting of INR greater than 2.2 shortly after AVR - now cleared by neurosurgery to maintain INR 2-2.5  . Dyslipidemia   . Anxiety   . Exertional shortness of breath   . Arthritis     "right little finger" (11/12/2012)  . Depression   . 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 Sleep apnea     "BiPAP" (11/12/2012)  . GERD (gastroesophageal reflux disease)     "several years ago; none since" (11/12/2012)  . Chronic anticoagulation   . Hyperlipidemia   . Atrial fibrillation     4/14 TEE cardioversion success but return to Atrial fibrillation on 07/29/12 then S/P TEE/DCCV after Tikosyn load and now back in atrial fibrillation    Current Outpatient Prescriptions  Medication Sig Dispense Refill  . calcium-vitamin D (OSCAL WITH D) 500-200 MG-UNIT per tablet Take 1 tablet by mouth daily.      . cholecalciferol (VITAMIN D) 1000 UNITS tablet  Take 1,000 Units by mouth daily.      Marland Kitchen dofetilide (TIKOSYN) 500 MCG capsule Take 1 capsule (500 mcg total) by mouth every 12 (twelve) hours.  60 capsule  1  . furosemide (LASIX) 80 MG tablet Take 1 tablet (80 mg total) by mouth daily.  30 tablet  12  . losartan (COZAAR) 50 MG tablet Take 50 mg by mouth daily.      . Multiple Vitamin (MULTIVITAMIN WITH MINERALS) TABS Take 1 tablet by mouth daily.      . potassium chloride SA (K-DUR,KLOR-CON) 20 MEQ tablet Take 40 mEq by mouth every morning.      . sertraline (ZOLOFT) 100 MG tablet Take 150 mg by mouth at bedtime.      . tretinoin (RETIN-A) 0.05 % cream Apply 1 Applicatorful topically at bedtime.      Marland Kitchen warfarin (COUMADIN) 1 MG tablet Take 4-5 mg by mouth See admin instructions. Takes 4mg  on Mon & Fri & 5mg  on all other days in the evening      . zolpidem (AMBIEN) 5 MG tablet Take 5 mg by mouth at bedtime as needed for sleep.       No current facility-administered medications for this visit.    Allergies:    Allergies  Allergen Reactions  . Codeine Anaphylaxis  jerking  . Beta Adrenergic Blockers     Makes her crazy  . Toprol Xl [Metoprolol Tartrate]     depression  . Dilantin [Phenytoin Sodium Extended] Rash    Social History:  The patient  reports that she has never smoked. She has never used smokeless tobacco. She reports that she drinks alcohol. She reports that she does not use illicit drugs.   Family History:  The patient's family history includes Heart disease in her brother; Hyperlipidemia in her father; Hypertension in her father and mother; Lung cancer in her mother.   ROS:  Please see the history of present illness.      All other systems reviewed and negative.   PHYSICAL EXAM: VS:  BP 112/68  Pulse 88  Ht 5\' 7"  (1.702 m)  Wt 231 lb (104.781 kg)  BMI 36.17 kg/m2 Well nourished, well developed, in no acute distress HEENT: normal Neck: no JVD Cardiac:  normal S1, S2; irregularly irregular; no murmur Lungs:  clear  to auscultation bilaterally, no wheezing, rhonchi or rales Abd: soft, nontender, no hepatomegaly Ext: no edema Skin: warm and dry Neuro:  CNs 2-12 intact, no focal abnormalities noted       ASSESSMENT AND PLAN:  1. Atrial fibrillation s/p Tikosyn load and DCCV no reverted back to afib with CVR.  - continue Tikosyn for now  - Continue Warfarin - she will be seen in Coumadin clinic today  - I am going to see if we can move her appt date with EP up since she is SOB  2.  HTN - well controlled  - continue Losartan/Diltiazem 3.  OSA on CPAP  - continue current settings 4.  AS s/p AVR 5.  Chronic systolic CHF - compensated  - Continue Lasix  Followup with me in 2 months  Signed, Armanda Magic, MD 01/15/2013 2:27 PM

## 2013-01-16 DIAGNOSIS — Z23 Encounter for immunization: Secondary | ICD-10-CM | POA: Diagnosis not present

## 2013-01-20 ENCOUNTER — Encounter: Payer: Self-pay | Admitting: Internal Medicine

## 2013-01-20 ENCOUNTER — Ambulatory Visit (INDEPENDENT_AMBULATORY_CARE_PROVIDER_SITE_OTHER): Payer: Medicare Other | Admitting: Pharmacist

## 2013-01-20 ENCOUNTER — Ambulatory Visit (INDEPENDENT_AMBULATORY_CARE_PROVIDER_SITE_OTHER): Payer: Medicare Other | Admitting: Internal Medicine

## 2013-01-20 VITALS — BP 110/76 | HR 101 | Ht 67.0 in | Wt 230.8 lb

## 2013-01-20 DIAGNOSIS — I10 Essential (primary) hypertension: Secondary | ICD-10-CM | POA: Diagnosis not present

## 2013-01-20 DIAGNOSIS — I4891 Unspecified atrial fibrillation: Secondary | ICD-10-CM | POA: Diagnosis not present

## 2013-01-20 DIAGNOSIS — I359 Nonrheumatic aortic valve disorder, unspecified: Secondary | ICD-10-CM

## 2013-01-20 DIAGNOSIS — I509 Heart failure, unspecified: Secondary | ICD-10-CM | POA: Diagnosis not present

## 2013-01-20 DIAGNOSIS — Z7901 Long term (current) use of anticoagulants: Secondary | ICD-10-CM

## 2013-01-20 DIAGNOSIS — G4733 Obstructive sleep apnea (adult) (pediatric): Secondary | ICD-10-CM

## 2013-01-20 DIAGNOSIS — Z954 Presence of other heart-valve replacement: Secondary | ICD-10-CM

## 2013-01-20 DIAGNOSIS — E876 Hypokalemia: Secondary | ICD-10-CM

## 2013-01-20 DIAGNOSIS — I5032 Chronic diastolic (congestive) heart failure: Secondary | ICD-10-CM

## 2013-01-20 LAB — TSH: TSH: 2.48 u[IU]/mL (ref 0.35–5.50)

## 2013-01-20 LAB — POCT INR: INR: 1.9

## 2013-01-20 MED ORDER — AMIODARONE HCL 400 MG PO TABS: 400.0000 mg | ORAL_TABLET | Freq: Every day | ORAL | Status: DC

## 2013-01-20 MED ORDER — AMIODARONE HCL 200 MG PO TABS: 200.0000 mg | ORAL_TABLET | Freq: Every day | ORAL | Status: DC

## 2013-01-20 NOTE — Progress Notes (Signed)
Primary Care Physician: Elie Confer, MD Referring Physician:  Dr Tomie China is a 67 y.o. female with a h/o obesity, OSA, s/p mechanical AVR, and persistent afib who presents for EP consultation.  She reports initially being diagnosed with atrial fibrillation 4/14 after presenting to Dr Malachy Mood office with symptoms of fatigue and decreased exercise tolerance.  She was noted to be in afib with RVR at that time.  She tried beta blockers but did not tolerate this due to "a reaction".  She has since been rate controlled with diltiazem.  She was cardioverted 4/14 but returned to afib the next day.  She was therefore placed on tikosyn.  She had persistent afib and therefore was placed on tikosyn.  She was again cardioverted but immediately returned to afib.  She has been in afib ever since.  She reports symptoms of fatigue and decreased exercise tolerance but is unaware of palpitations.  Today, she denies symptoms of chest pain, orthopnea, PND, dizziness, presyncope, syncope, or neurologic sequela. She has occasional leg swelling.   The patient is tolerating medications without difficulties and is otherwise without complaint today.  Past Medical History  Diagnosis Date  . Obesity   . Hypertension   . Aortic stenosis     status post aortic valve replacement with St. Jude mechanical prosthesis  . Subdural hematoma     in setting of INR greater than 2.2 shortly after AVR - now cleared by neurosurgery to maintain INR 2-2.5  . Dyslipidemia   . Anxiety   . Exertional shortness of breath   . Arthritis     "right little finger" (11/12/2012)  . Depression   . 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 Sleep apnea     "BiPAP" (11/12/2012)  . GERD (gastroesophageal reflux disease)     "several years ago; none since" (11/12/2012)  . Chronic anticoagulation   . Hyperlipidemia   . Persistent atrial fibrillation     4/14 TEE cardioversion success but  return to Atrial fibrillation on 07/29/12 then S/P TEE/DCCV after Tikosyn load and now back in atrial fibrillation   Past Surgical History  Procedure Laterality Date  . Burr hole for subdural hematoma  2006  . Knee arthroscopy Left 1980's    "2" (11/12/2012)  . Tee without cardioversion N/A 07/22/2012    Procedure: TRANSESOPHAGEAL ECHOCARDIOGRAM (TEE);  Surgeon: Donato Schultz, MD;  Location: [facility] ENDOSCOPY;  Service: Cardiovascular;  Laterality: N/A;  Rm 2034  . Cardioversion N/A 07/22/2012    Procedure: CARDIOVERSION;  Surgeon: Donato Schultz, MD;  Location: [facility] ENDOSCOPY;  Service: Cardiovascular;  Laterality: N/A;  . Knee surgery Left 1980's    "after 2 scopes they went in and did some kind of OR" (11/12/2012)  . Tubal ligation  1980's  . Cardiac valve replacement  2006    St. Jude AVR  . Cardioversion N/A 11/25/2012    Procedure: CARDIOVERSION;  Surgeon: Quintella Reichert, MD;  Location: [facility] ENDOSCOPY;  Service: Cardiovascular;  Laterality: N/A;  . Cardioversion N/A 12/30/2012    Procedure: CARDIOVERSION;  Surgeon: Quintella Reichert, MD;  Location: [facility] ENDOSCOPY;  Service: Cardiovascular;  Laterality: N/A;  h&p in file-HW     Current Outpatient Prescriptions  Medication Sig Dispense Refill  . calcium-vitamin D (OSCAL WITH D) 500-200 MG-UNIT per tablet Take 1 tablet by mouth daily.      . cholecalciferol (VITAMIN D) 1000 UNITS tablet Take 1,000 Units by mouth daily.      Marland Kitchen  diltiazem (TIAZAC) 300 MG 24 hr capsule Take 300 mg by mouth daily.      Marland Kitchen dofetilide (TIKOSYN) 500 MCG capsule Take 1 capsule (500 mcg total) by mouth every 12 (twelve) hours.  60 capsule  1  . furosemide (LASIX) 80 MG tablet Take 1 tablet (80 mg total) by mouth daily.  30 tablet  12  . losartan (COZAAR) 50 MG tablet Take 50 mg by mouth daily.      . Multiple Vitamin (MULTIVITAMIN WITH MINERALS) TABS Take 1 tablet by mouth daily.      . potassium chloride SA (K-DUR,KLOR-CON) 20 MEQ tablet Take 60 mEq by mouth every morning.       .  sertraline (ZOLOFT) 100 MG tablet Take 150 mg by mouth at bedtime.      . tretinoin (RETIN-A) 0.05 % cream Apply 1 Applicatorful topically at bedtime.      Marland Kitchen warfarin (COUMADIN) 1 MG tablet Take 4-5 mg by mouth See admin instructions. Takes 4mg  on Mon & Fri & 5mg  on all other days in the evening      . zolpidem (AMBIEN) 5 MG tablet Take 5 mg by mouth at bedtime as needed for sleep.       No current facility-administered medications for this visit.    Allergies  Allergen Reactions  . Codeine Anaphylaxis    jerking  . Beta Adrenergic Blockers     Makes her crazy  . Toprol Xl [Metoprolol Tartrate]     depression  . Dilantin [Phenytoin Sodium Extended] Rash    History   Social History  . Marital Status: Married    Spouse Name: N/A    Number of Children: N/A  . Years of Education: N/A   Occupational History  . Not on file.   Social History Main Topics  . Smoking status: Never Smoker   . Smokeless tobacco: Never Used  . Alcohol Use: Yes     Comment: 11/12/2012 "no alcohol in the last 9 years"  . Drug Use: No  . Sexual Activity: Not Currently   Other Topics Concern  . Not on file   Social History Narrative   Pt lives in [place] with spouse.  Retired    Family History  Problem Relation Age of Onset  . Lung cancer Mother   . Hypertension Mother   . Hyperlipidemia Father   . Hypertension Father   . Heart disease Brother     ROS- All systems are reviewed and negative except as per the HPI above  Physical Exam: Filed Vitals:   01/20/13 1429  BP: 110/76  Pulse: 101  Height: 5\' 7"  (1.702 m)  Weight: 230 lb 12.8 oz (104.69 kg)  SpO2: 95%    GEN- The patient is overweight appearing, alert and oriented x 3 today.   Head- normocephalic, atraumatic Eyes-  Sclera clear, conjunctiva pink Ears- hearing intact Oropharynx- clear Neck- supple, no JVP Lymph- no cervical lymphadenopathy Lungs- Clear to ausculation bilaterally, normal work of breathing Heart- Regular  rate and rhythm, mechanical S2 GI- soft, NT, ND, + BS Extremities- no clubbing, cyanosis, trace edema MS- no significant deformity or atrophy Skin- no rash or lesion Psych- euthymic mood, full affect Neuro- strength and sensation are intact  EKG- Tee 4/14 is reviewed Dr Malachy Mood records are reviewed  Assessment and Plan:  1. Persistent afib The patient has symptomatic persistent afib.  She has failed medical therapy with beta blockers, calcium channel blockers, and tikosyn. Therapeutic strategies for afib including medicine and  ablation were discussed in detail with the patient today. Risk, benefits, and alternatives to EP study and radiofrequency ablation for afib were also discussed in detail today. The patient is very clear that she is not interested in ablation at this time.  She would like to consider additional AAD therapy.  Risks, benefits, and alternatives to amiodarone therapy were discussed with the patient who wishes to proceed.  I will therefore stop tikosyn and after 3 day washout start amiodarone 400mg  BID x 7 days then 200mg  BID with close follow-up by Dr Mayford Knife in 4 weeks. We will check LFTs, TFTs, and PFTs upon initiation of amiodarone She will need weekly INRs and follow-up with Dr Mayford Knife in 3-4 weeks.  IF she has not converted to sinus rhythm then repeat cardioversion is advised at that time. 2D echo to evaluate LA size.  2. HTN Stable No change required today  3. OSA Compliance with CPAP is encouraged  4. Obesity Weight loss advised  She will follow-up with Dr Mayford Knife If she fails amiodarone therapy then I would be happy to see her again for further discussion of ablation at that time.

## 2013-01-20 NOTE — Patient Instructions (Addendum)
Your physician has requested that you have an echocardiogram BEFORE DR. Mayford Knife APPOINTMENT. Echocardiography is a painless test that uses sound waves to create images of your heart. It provides your doctor with information about the size and shape of your heart and how well your heart's chambers and valves are working. This procedure takes approximately one hour. There are no restrictions for this procedure.  Your physician recommends that you schedule a follow-up appointment in: WITH DR. Mayford Knife IN 4 WEEKS   Your physician recommends that you return for lab work in: pulmanary function test, tsh today, lft 01-20-2013  Your physician has recommended you make the following change in your medication:   STOP TIKOSYN TODAY  START AMIODARONE ON Saturday 400 MG TWICE A DAY FOR 7 DAYS AFTER THAT START 200 MG TWICE A DAY (TWELVE HOURS APART)

## 2013-01-21 ENCOUNTER — Telehealth: Payer: Self-pay | Admitting: Cardiology

## 2013-01-21 NOTE — Telephone Encounter (Signed)
New call  Patient has questions and concerns regarding medication Amiovadarone. Please call. Only wants to speak with Dr Mayford Knife or nurse.

## 2013-01-21 NOTE — Telephone Encounter (Signed)
Pt went to see Dr. Johney Frame about afib. He had her stop taking tikosyn. They told her to start taking amiodarone on Saturday. She was very concerned about taking the amiodarone. She is scheduled for a ECHO and stress test. She just wanted to give [place] a heads up on the med change.

## 2013-01-22 NOTE — Telephone Encounter (Signed)
FYI patient did not want ablation and failed Tikosyn.  Dr. Johney Frame started her on Amio so her INR needs to be watched very closely

## 2013-01-22 NOTE — Telephone Encounter (Signed)
Aware.  Dose adjustment made, and will see me Monday (two days after starting amiodarone).

## 2013-01-23 ENCOUNTER — Telehealth: Payer: Self-pay | Admitting: Internal Medicine

## 2013-01-23 NOTE — Telephone Encounter (Signed)
New Problem  Pts picked up prescriptions// unsure about the dosage instructions//request a call back to discuss.   Pt requested that this message go to Dr. Johney Frame.

## 2013-01-26 ENCOUNTER — Ambulatory Visit (INDEPENDENT_AMBULATORY_CARE_PROVIDER_SITE_OTHER): Payer: Medicare Other | Admitting: Pharmacist

## 2013-01-26 DIAGNOSIS — I4891 Unspecified atrial fibrillation: Secondary | ICD-10-CM | POA: Diagnosis not present

## 2013-01-26 DIAGNOSIS — I359 Nonrheumatic aortic valve disorder, unspecified: Secondary | ICD-10-CM

## 2013-01-26 DIAGNOSIS — Z954 Presence of other heart-valve replacement: Secondary | ICD-10-CM | POA: Diagnosis not present

## 2013-01-26 LAB — POCT INR: INR: 2.3

## 2013-01-26 NOTE — Telephone Encounter (Signed)
Patient tells me amiodarone 400 mg was sent in to pharmacy with written instruction on bottle to take 1 tablet daily.  Patient was told by Dr. Johney Frame to take amiodarone 400 mg twice daily. She has been taking it twice daily as Dr. Johney Frame told her to do since starting it 2 days ago.  She just wanted to know if she needed a different prescription called to the pharmcy.

## 2013-01-27 ENCOUNTER — Ambulatory Visit (HOSPITAL_COMMUNITY)
Admission: RE | Admit: 2013-01-27 | Discharge: 2013-01-27 | Disposition: A | Discharge status: Home / Self Care | Payer: Medicare Other | Source: Ambulatory Visit | Attending: Internal Medicine | Admitting: Internal Medicine

## 2013-01-27 DIAGNOSIS — I4891 Unspecified atrial fibrillation: Secondary | ICD-10-CM

## 2013-01-27 DIAGNOSIS — E876 Hypokalemia: Secondary | ICD-10-CM

## 2013-01-27 DIAGNOSIS — Z01811 Encounter for preprocedural respiratory examination: Secondary | ICD-10-CM | POA: Insufficient documentation

## 2013-01-27 DIAGNOSIS — I509 Heart failure, unspecified: Secondary | ICD-10-CM | POA: Insufficient documentation

## 2013-01-27 DIAGNOSIS — I359 Nonrheumatic aortic valve disorder, unspecified: Secondary | ICD-10-CM | POA: Diagnosis not present

## 2013-01-27 DIAGNOSIS — Z7901 Long term (current) use of anticoagulants: Secondary | ICD-10-CM

## 2013-01-27 DIAGNOSIS — I5032 Chronic diastolic (congestive) heart failure: Secondary | ICD-10-CM

## 2013-01-27 LAB — PULMONARY FUNCTION TEST

## 2013-01-28 ENCOUNTER — Ambulatory Visit: Payer: Medicare Other | Admitting: Internal Medicine

## 2013-01-28 ENCOUNTER — Telehealth: Payer: Self-pay | Admitting: General Surgery

## 2013-01-28 NOTE — Telephone Encounter (Signed)
Dr. Titus Mould with Pt.   Discussed with patient her concerns about potential side effects of amiodarone. She has started the [facility] and is feeling better. I reassured her that the potential risk of side effects from amio is small and we will follow her liver and thyroid function and PFTs closely while on amio.

## 2013-01-28 NOTE — Telephone Encounter (Signed)
Message copied by Nita Sells on Wed Jan 28, 2013  9:57 AM ------      Message from: Armanda Magic R      Created: Wed Jan 28, 2013  9:51 AM       Discussed with patient her concerns about potential side effects of amiodarone.  She has started the [facility] and is feeling better.  I reassured her that the potential risk of side effects from amio is small and we will follow her liver and thyroid function and PFTs closely while on amio. ------

## 2013-01-30 ENCOUNTER — Ambulatory Visit (INDEPENDENT_AMBULATORY_CARE_PROVIDER_SITE_OTHER): Payer: Medicare Other | Admitting: Pharmacist

## 2013-01-30 DIAGNOSIS — Z954 Presence of other heart-valve replacement: Secondary | ICD-10-CM | POA: Diagnosis not present

## 2013-01-30 DIAGNOSIS — I359 Nonrheumatic aortic valve disorder, unspecified: Secondary | ICD-10-CM

## 2013-01-30 DIAGNOSIS — I4891 Unspecified atrial fibrillation: Secondary | ICD-10-CM

## 2013-01-30 LAB — POCT INR: INR: 2

## 2013-02-02 ENCOUNTER — Other Ambulatory Visit: Payer: Self-pay | Admitting: Cardiology

## 2013-02-03 ENCOUNTER — Ambulatory Visit (INDEPENDENT_AMBULATORY_CARE_PROVIDER_SITE_OTHER): Payer: Medicare Other | Admitting: Pharmacist

## 2013-02-03 ENCOUNTER — Ambulatory Visit (HOSPITAL_COMMUNITY): Payer: Medicare Other | Attending: Internal Medicine

## 2013-02-03 ENCOUNTER — Other Ambulatory Visit: Payer: Self-pay | Admitting: Internal Medicine

## 2013-02-03 ENCOUNTER — Other Ambulatory Visit: Payer: Self-pay | Admitting: *Deleted

## 2013-02-03 DIAGNOSIS — Z7901 Long term (current) use of anticoagulants: Secondary | ICD-10-CM

## 2013-02-03 DIAGNOSIS — I079 Rheumatic tricuspid valve disease, unspecified: Secondary | ICD-10-CM | POA: Diagnosis not present

## 2013-02-03 DIAGNOSIS — Z954 Presence of other heart-valve replacement: Secondary | ICD-10-CM

## 2013-02-03 DIAGNOSIS — E669 Obesity, unspecified: Secondary | ICD-10-CM | POA: Insufficient documentation

## 2013-02-03 DIAGNOSIS — I4891 Unspecified atrial fibrillation: Secondary | ICD-10-CM

## 2013-02-03 DIAGNOSIS — I10 Essential (primary) hypertension: Secondary | ICD-10-CM | POA: Diagnosis not present

## 2013-02-03 DIAGNOSIS — I5032 Chronic diastolic (congestive) heart failure: Secondary | ICD-10-CM

## 2013-02-03 DIAGNOSIS — I359 Nonrheumatic aortic valve disorder, unspecified: Secondary | ICD-10-CM | POA: Diagnosis not present

## 2013-02-03 DIAGNOSIS — I509 Heart failure, unspecified: Secondary | ICD-10-CM | POA: Insufficient documentation

## 2013-02-03 DIAGNOSIS — E876 Hypokalemia: Secondary | ICD-10-CM

## 2013-02-03 LAB — POCT INR: INR: 2.2

## 2013-02-03 MED ORDER — AMIODARONE HCL 200 MG PO TABS: 200.0000 mg | ORAL_TABLET | Freq: Two times a day (BID) | ORAL | Status: DC

## 2013-02-03 NOTE — Progress Notes (Signed)
Echocardiogram performed.  

## 2013-02-05 ENCOUNTER — Ambulatory Visit: Payer: Medicare Other | Admitting: Internal Medicine

## 2013-02-06 ENCOUNTER — Ambulatory Visit (INDEPENDENT_AMBULATORY_CARE_PROVIDER_SITE_OTHER): Payer: Medicare Other | Admitting: General Practice

## 2013-02-06 DIAGNOSIS — I359 Nonrheumatic aortic valve disorder, unspecified: Secondary | ICD-10-CM

## 2013-02-06 DIAGNOSIS — Z954 Presence of other heart-valve replacement: Secondary | ICD-10-CM | POA: Diagnosis not present

## 2013-02-06 DIAGNOSIS — I4891 Unspecified atrial fibrillation: Secondary | ICD-10-CM

## 2013-02-06 LAB — POCT INR: INR: 2.5

## 2013-02-09 ENCOUNTER — Ambulatory Visit (INDEPENDENT_AMBULATORY_CARE_PROVIDER_SITE_OTHER): Payer: Medicare Other | Admitting: Pharmacist

## 2013-02-09 DIAGNOSIS — I359 Nonrheumatic aortic valve disorder, unspecified: Secondary | ICD-10-CM | POA: Diagnosis not present

## 2013-02-09 DIAGNOSIS — Z954 Presence of other heart-valve replacement: Secondary | ICD-10-CM

## 2013-02-09 DIAGNOSIS — I4891 Unspecified atrial fibrillation: Secondary | ICD-10-CM | POA: Diagnosis not present

## 2013-02-09 LAB — POCT INR: INR: 2.8

## 2013-02-11 DIAGNOSIS — H2510 Age-related nuclear cataract, unspecified eye: Secondary | ICD-10-CM | POA: Diagnosis not present

## 2013-02-12 ENCOUNTER — Ambulatory Visit (INDEPENDENT_AMBULATORY_CARE_PROVIDER_SITE_OTHER): Payer: Medicare Other | Admitting: Cardiology

## 2013-02-12 ENCOUNTER — Encounter: Payer: Self-pay | Admitting: Cardiology

## 2013-02-12 ENCOUNTER — Other Ambulatory Visit: Payer: Self-pay

## 2013-02-12 ENCOUNTER — Ambulatory Visit (INDEPENDENT_AMBULATORY_CARE_PROVIDER_SITE_OTHER): Payer: Medicare Other | Admitting: General Practice

## 2013-02-12 VITALS — BP 120/84 | HR 100 | Ht 67.0 in | Wt 232.0 lb

## 2013-02-12 DIAGNOSIS — I4891 Unspecified atrial fibrillation: Secondary | ICD-10-CM

## 2013-02-12 DIAGNOSIS — I359 Nonrheumatic aortic valve disorder, unspecified: Secondary | ICD-10-CM

## 2013-02-12 DIAGNOSIS — I10 Essential (primary) hypertension: Secondary | ICD-10-CM

## 2013-02-12 DIAGNOSIS — Z952 Presence of prosthetic heart valve: Secondary | ICD-10-CM

## 2013-02-12 DIAGNOSIS — Z954 Presence of other heart-valve replacement: Secondary | ICD-10-CM

## 2013-02-12 DIAGNOSIS — I5032 Chronic diastolic (congestive) heart failure: Secondary | ICD-10-CM

## 2013-02-12 DIAGNOSIS — Z7901 Long term (current) use of anticoagulants: Secondary | ICD-10-CM

## 2013-02-12 DIAGNOSIS — G4733 Obstructive sleep apnea (adult) (pediatric): Secondary | ICD-10-CM

## 2013-02-12 DIAGNOSIS — I350 Nonrheumatic aortic (valve) stenosis: Secondary | ICD-10-CM

## 2013-02-12 DIAGNOSIS — I509 Heart failure, unspecified: Secondary | ICD-10-CM

## 2013-02-12 MED ORDER — AMIODARONE HCL 200 MG PO TABS: 200.0000 mg | ORAL_TABLET | Freq: Every day | ORAL | Status: DC

## 2013-02-12 MED ORDER — DILTIAZEM HCL ER BEADS 360 MG PO CP24: 360.0000 mg | ORAL_CAPSULE | Freq: Every day | ORAL | Status: DC

## 2013-02-12 NOTE — Patient Instructions (Signed)
Your physician has recommended you make the following change in your medication: Decrease Amiodarone to 200 Mg Once a day and Increase  Tiazac to 360 MG daily   Your physician recommends that you go to the lab today to have a Amiodarone level drawn  Your physician recommends that you schedule a follow-up appointment in: 3 Months with Dr Mayford Knife

## 2013-02-12 NOTE — Progress Notes (Signed)
[address] [place], [place]  [address] Phone: [phone] Fax:  [phone]  Date:  02/12/2013   ID:  [name], DOB 09-01-1945, MRN [id number]  PCP:  Elie Confer, MD  Cardiologist:  Griselda Miner, MD     History of Present Illness: [name] is a 67 y.o. female with a history of afib s/p failed Tikosyn load and recurrent DCCV now on Amiodarone, OSA on CPAP, AS s/p mechanical AVR and chronic anticoagulation who presents today for followup. She says that she has been feeling better over the past few weeks.  Her breathing has improved and she has less fatigue.  She recently had a 2D echo which showed markedly dilated LA but normal LVF.  She has been on Amiodarone for a few weeks now.   Wt Readings from Last 3 Encounters:  02/12/13 232 lb (105.235 kg)  01/20/13 230 lb 12.8 oz (104.69 kg)  01/15/13 231 lb (104.781 kg)     Past Medical History  Diagnosis Date  . Obesity   . Hypertension   . Aortic stenosis     status post aortic valve replacement with St. Jude mechanical prosthesis  . Subdural hematoma     in setting of INR greater than 2.2 shortly after AVR - now cleared by neurosurgery to maintain INR 2-2.5  . Dyslipidemia   . Anxiety   . Exertional shortness of breath   . Arthritis     "right little finger" (11/12/2012)  . Depression   . 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 Sleep apnea     "BiPAP" (11/12/2012)  . GERD (gastroesophageal reflux disease)     "several years ago; none since" (11/12/2012)  . Chronic anticoagulation   . Hyperlipidemia   . Persistent atrial fibrillation     4/14 TEE cardioversion success but return to Atrial fibrillation on 07/29/12 then S/P TEE/DCCV after Tikosyn load and now back in atrial fibrillation    Current Outpatient Prescriptions  Medication Sig Dispense Refill  . amiodarone (PACERONE) 200 MG tablet Take 1 tablet (200 mg total) by mouth 2 (two) times daily.  60  tablet  3  . calcium-vitamin D (OSCAL WITH D) 500-200 MG-UNIT per tablet Take 1 tablet by mouth daily.      . cholecalciferol (VITAMIN D) 1000 UNITS tablet Take 1,000 Units by mouth daily.      . furosemide (LASIX) 80 MG tablet Take 1 tablet (80 mg total) by mouth daily.  30 tablet  12  . losartan (COZAAR) 50 MG tablet Take 50 mg by mouth daily.      . Multiple Vitamin (MULTIVITAMIN WITH MINERALS) TABS Take 1 tablet by mouth daily.      . potassium chloride SA (K-DUR,KLOR-CON) 20 MEQ tablet Take 60 mEq by mouth every morning.       . sertraline (ZOLOFT) 100 MG tablet Take 150 mg by mouth at bedtime.      . TAZTIA XT 300 MG 24 hr capsule TAKE 1 CAPSULE BY MOUTH EVERY DAY  30 capsule  5  . tretinoin (RETIN-A) 0.05 % cream Apply 1 Applicatorful topically at bedtime.      Marland Kitchen warfarin (COUMADIN) 1 MG tablet Take 4-5 mg by mouth See admin instructions. Takes 4mg  on Mon & Fri & 5mg  on all other days in the evening      . zolpidem (AMBIEN) 5 MG tablet Take 5 mg by mouth at bedtime as needed  for sleep.       No current facility-administered medications for this visit.    Allergies:    Allergies  Allergen Reactions  . Codeine Anaphylaxis    jerking  . Beta Adrenergic Blockers     Makes her crazy  . Toprol Xl [Metoprolol Tartrate]     depression  . Dilantin [Phenytoin Sodium Extended] Rash    Social History:  The patient  reports that she has never smoked. She has never used smokeless tobacco. She reports that she drinks alcohol. She reports that she does not use illicit drugs.   Family History:  The patient's family history includes Heart disease in her brother; Hyperlipidemia in her father; Hypertension in her father and mother; Lung cancer in her mother.   ROS:  Please see the history of present illness.      All other systems reviewed and negative.   PHYSICAL EXAM: VS:  BP 120/84  Pulse 100  Ht 5\' 7"  (1.702 m)  Wt 232 lb (105.235 kg)  BMI 36.33 kg/m2 Well nourished, well developed, in  no acute distress HEENT: normal Neck: no JVD Cardiac:  normal S1, S2; RRR; no murmur Lungs:  clear to auscultation bilaterally, no wheezing, rhonchi or rales Abd: soft, nontender, no hepatomegaly Ext: no edema Skin: warm and dry Neuro:  CNs 2-12 intact, no focal abnormalities noted  EKG:     Atrial flutter with 2:1 block and RBBB  ASSESSMENT AND PLAN:  1.   Atrial fibrillation now in atrial flutter on Amiodarone load with borderline HR control  - continue Amio and check an amio level - if therapeutic will plan DCCV  - continue Warfarin  - increase Cardizem to 360mg  daily for better HR control 2.  Chronic systemic anticoagulation 3.  ASA on CPAP therapy  - continue CPAP  4.  Obesity 5.  AS s/p mechanical AVR 6.  Chronic diastolic CHF - compensated  - continue Lasix 7.  HTN well controlled  - continue Losartan/Diltiazem  Followup with me in 3 months   Signed, Armanda Magic, MD 02/12/2013 8:52 AM

## 2013-02-17 ENCOUNTER — Ambulatory Visit (INDEPENDENT_AMBULATORY_CARE_PROVIDER_SITE_OTHER): Payer: Medicare Other | Admitting: Pharmacist

## 2013-02-17 DIAGNOSIS — I359 Nonrheumatic aortic valve disorder, unspecified: Secondary | ICD-10-CM | POA: Diagnosis not present

## 2013-02-17 DIAGNOSIS — Z954 Presence of other heart-valve replacement: Secondary | ICD-10-CM

## 2013-02-17 DIAGNOSIS — I4891 Unspecified atrial fibrillation: Secondary | ICD-10-CM

## 2013-02-17 LAB — AMIODARONE LEVEL
Amiodarone Lvl: 0.7 ug/mL — ABNORMAL LOW (ref 1.5–2.5)
Desethylamiodarone: 0.5 ug/mL — ABNORMAL LOW (ref 1.5–2.5)

## 2013-02-17 LAB — POCT INR: INR: 2.5

## 2013-02-18 ENCOUNTER — Telehealth: Payer: Self-pay | Admitting: General Surgery

## 2013-02-18 DIAGNOSIS — I4891 Unspecified atrial fibrillation: Secondary | ICD-10-CM

## 2013-02-18 NOTE — Telephone Encounter (Signed)
Please have her increase amio to 200mg  BID for 2 week and recheck amio level.  Please forward to [facility] for management of warfarin with amio increase

## 2013-02-18 NOTE — Telephone Encounter (Signed)
Pt was taking 200 Mg twice a day and at our last visit (11/6) we decreased the ami to 200 MG daily and increase Diltiazem to 360 MG daily.

## 2013-02-18 NOTE — Telephone Encounter (Signed)
Message copied by Nita Sells on Wed Feb 18, 2013  9:04 AM ------      Message from: Armanda Magic R      Created: Wed Feb 18, 2013  8:36 AM       Amio level subtherapeutic.  Please find out what dose of amio she is currently on ------

## 2013-02-19 NOTE — Telephone Encounter (Signed)
lvm to return call.

## 2013-02-19 NOTE — Progress Notes (Signed)
Telephone encounter made to make pt aware.

## 2013-02-20 ENCOUNTER — Telehealth: Payer: Self-pay | Admitting: Cardiology

## 2013-02-20 MED ORDER — AMIODARONE HCL 200 MG PO TABS: 200.0000 mg | ORAL_TABLET | Freq: Every day | ORAL | Status: DC

## 2013-02-20 NOTE — Telephone Encounter (Signed)
New Problem:  Pt states she is calling Danielle back. No open encounter in Epic.Please advise

## 2013-02-20 NOTE — Telephone Encounter (Signed)
Pt is aware and labs ordered and put on schedule

## 2013-02-20 NOTE — Telephone Encounter (Signed)
Called pt and asked her to page me once she gets message. We have a med change for pt that needs to be done.

## 2013-02-20 NOTE — Addendum Note (Signed)
Addended by: Nita Sells on: 02/20/2013 11:00 AM   Modules accepted: Orders

## 2013-02-26 ENCOUNTER — Ambulatory Visit (INDEPENDENT_AMBULATORY_CARE_PROVIDER_SITE_OTHER): Payer: Medicare Other | Admitting: Pharmacist

## 2013-02-26 DIAGNOSIS — I4891 Unspecified atrial fibrillation: Secondary | ICD-10-CM | POA: Diagnosis not present

## 2013-02-26 DIAGNOSIS — Z954 Presence of other heart-valve replacement: Secondary | ICD-10-CM

## 2013-02-26 DIAGNOSIS — I359 Nonrheumatic aortic valve disorder, unspecified: Secondary | ICD-10-CM

## 2013-02-26 LAB — POCT INR: INR: 2.7

## 2013-02-26 MED ORDER — WARFARIN SODIUM 1 MG PO TABS: ORAL_TABLET | ORAL | Status: DC

## 2013-03-03 ENCOUNTER — Other Ambulatory Visit: Payer: Self-pay | Admitting: Cardiology

## 2013-03-04 ENCOUNTER — Ambulatory Visit (INDEPENDENT_AMBULATORY_CARE_PROVIDER_SITE_OTHER): Payer: Medicare Other | Admitting: Pharmacist

## 2013-03-04 DIAGNOSIS — Z954 Presence of other heart-valve replacement: Secondary | ICD-10-CM | POA: Diagnosis not present

## 2013-03-04 DIAGNOSIS — I359 Nonrheumatic aortic valve disorder, unspecified: Secondary | ICD-10-CM | POA: Diagnosis not present

## 2013-03-04 DIAGNOSIS — I4891 Unspecified atrial fibrillation: Secondary | ICD-10-CM | POA: Diagnosis not present

## 2013-03-09 ENCOUNTER — Other Ambulatory Visit: Payer: Self-pay | Admitting: General Surgery

## 2013-03-09 ENCOUNTER — Ambulatory Visit (INDEPENDENT_AMBULATORY_CARE_PROVIDER_SITE_OTHER): Payer: Medicare Other | Admitting: Pharmacist

## 2013-03-09 ENCOUNTER — Other Ambulatory Visit (INDEPENDENT_AMBULATORY_CARE_PROVIDER_SITE_OTHER): Payer: Medicare Other

## 2013-03-09 ENCOUNTER — Encounter: Payer: Self-pay | Admitting: General Surgery

## 2013-03-09 DIAGNOSIS — I359 Nonrheumatic aortic valve disorder, unspecified: Secondary | ICD-10-CM | POA: Diagnosis not present

## 2013-03-09 DIAGNOSIS — I4891 Unspecified atrial fibrillation: Secondary | ICD-10-CM | POA: Diagnosis not present

## 2013-03-09 DIAGNOSIS — Z954 Presence of other heart-valve replacement: Secondary | ICD-10-CM | POA: Diagnosis not present

## 2013-03-09 LAB — POCT INR: INR: 2.8

## 2013-03-09 LAB — CBC WITH DIFFERENTIAL/PLATELET
Basophils Absolute: 0 10*3/uL (ref 0.0–0.1)
Basophils Relative: 0.6 % (ref 0.0–3.0)
Eosinophils Absolute: 0.1 10*3/uL (ref 0.0–0.7)
HCT: 36.6 % (ref 36.0–46.0)
MCHC: 33.8 g/dL (ref 30.0–36.0)
MCV: 91.3 fl (ref 78.0–100.0)
Monocytes Absolute: 0.7 10*3/uL (ref 0.1–1.0)
Neutrophils Relative %: 66.8 % (ref 43.0–77.0)
Platelets: 258 10*3/uL (ref 150.0–400.0)
RBC: 4.01 Mil/uL (ref 3.87–5.11)
RDW: 14.2 % (ref 11.5–14.6)
WBC: 6.1 10*3/uL (ref 4.5–10.5)

## 2013-03-09 LAB — BASIC METABOLIC PANEL
BUN: 18 mg/dL (ref 6–23)
CO2: 29 mEq/L (ref 19–32)
Chloride: 101 mEq/L (ref 96–112)
Creatinine, Ser: 0.8 mg/dL (ref 0.4–1.2)
Glucose, Bld: 99 mg/dL (ref 70–99)

## 2013-03-10 ENCOUNTER — Other Ambulatory Visit: Payer: Self-pay | Admitting: Cardiology

## 2013-03-10 DIAGNOSIS — I4891 Unspecified atrial fibrillation: Secondary | ICD-10-CM

## 2013-03-11 LAB — AMIODARONE LEVEL: Desethylamiodarone: 0.6 ug/mL — ABNORMAL LOW (ref 1.5–2.5)

## 2013-03-12 ENCOUNTER — Ambulatory Visit (INDEPENDENT_AMBULATORY_CARE_PROVIDER_SITE_OTHER): Payer: Medicare Other | Admitting: Pharmacist

## 2013-03-12 ENCOUNTER — Other Ambulatory Visit: Payer: Self-pay | Admitting: General Surgery

## 2013-03-12 ENCOUNTER — Encounter: Payer: Self-pay | Admitting: General Surgery

## 2013-03-12 DIAGNOSIS — Z954 Presence of other heart-valve replacement: Secondary | ICD-10-CM

## 2013-03-12 DIAGNOSIS — I359 Nonrheumatic aortic valve disorder, unspecified: Secondary | ICD-10-CM | POA: Diagnosis not present

## 2013-03-12 DIAGNOSIS — I4891 Unspecified atrial fibrillation: Secondary | ICD-10-CM

## 2013-03-12 LAB — POCT INR: INR: 2.6

## 2013-03-17 ENCOUNTER — Ambulatory Visit (INDEPENDENT_AMBULATORY_CARE_PROVIDER_SITE_OTHER): Payer: Medicare Other | Admitting: Pharmacist

## 2013-03-17 ENCOUNTER — Telehealth: Payer: Self-pay | Admitting: Cardiology

## 2013-03-17 DIAGNOSIS — I4891 Unspecified atrial fibrillation: Secondary | ICD-10-CM | POA: Diagnosis not present

## 2013-03-17 DIAGNOSIS — Z954 Presence of other heart-valve replacement: Secondary | ICD-10-CM

## 2013-03-17 DIAGNOSIS — I359 Nonrheumatic aortic valve disorder, unspecified: Secondary | ICD-10-CM | POA: Diagnosis not present

## 2013-03-17 NOTE — Telephone Encounter (Signed)
Pt notified. Pt will call back if symptoms persist. FYI to Danielle.

## 2013-03-17 NOTE — Telephone Encounter (Signed)
Please have her increase her Lasix to 80mg  BID for 3 days and then back to once daily and if symptoms do not improve in next 24 hours she needs to call

## 2013-03-17 NOTE — Telephone Encounter (Signed)
Pt states that for the last 5 days she has LE swelling, extremely  SOB and she has gain about  8 lbs since she was seen here in the office on 02/12/13. Pt would like to know if she can take an extra fluid pill. Pt takes Lasix 80 mg once a day. And potassium 60 mEq every AM.

## 2013-03-17 NOTE — Telephone Encounter (Signed)
New message  Patient has increased swelling and SOB, this has been going on for about 5 days. Please call and advise.

## 2013-03-23 ENCOUNTER — Ambulatory Visit (INDEPENDENT_AMBULATORY_CARE_PROVIDER_SITE_OTHER): Payer: Medicare Other | Admitting: Pharmacist

## 2013-03-23 DIAGNOSIS — I359 Nonrheumatic aortic valve disorder, unspecified: Secondary | ICD-10-CM | POA: Diagnosis not present

## 2013-03-23 DIAGNOSIS — Z954 Presence of other heart-valve replacement: Secondary | ICD-10-CM | POA: Diagnosis not present

## 2013-03-23 DIAGNOSIS — I4891 Unspecified atrial fibrillation: Secondary | ICD-10-CM | POA: Diagnosis not present

## 2013-03-23 LAB — BASIC METABOLIC PANEL
BUN: 18 mg/dL (ref 6–23)
CO2: 30 mEq/L (ref 19–32)
Chloride: 102 mEq/L (ref 96–112)
GFR: 72.71 mL/min (ref >60.00)
Glucose, Bld: 105 mg/dL — ABNORMAL HIGH (ref 70–99)
Potassium: 3.6 mEq/L (ref 3.5–5.1)
Sodium: 141 mEq/L (ref 135–145)

## 2013-03-23 LAB — CBC WITH DIFFERENTIAL/PLATELET
Basophils Absolute: 0.1 10*3/uL (ref 0.0–0.1)
Eosinophils Relative: 2.1 % (ref 0.0–5.0)
HCT: 38.7 % (ref 36.0–46.0)
Hemoglobin: 12.9 g/dL (ref 12.0–15.0)
Lymphs Abs: 1.4 10*3/uL (ref 0.7–4.0)
MCHC: 33.5 g/dL (ref 30.0–36.0)
MCV: 92.2 fl (ref 78.0–100.0)
Monocytes Absolute: 0.6 10*3/uL (ref 0.1–1.0)
Monocytes Relative: 8.6 % (ref 3.0–12.0)
Neutro Abs: 4.4 10*3/uL (ref 1.4–7.7)
Neutrophils Relative %: 66.5 % (ref 43.0–77.0)
RDW: 13.9 % (ref 11.5–14.6)

## 2013-03-25 ENCOUNTER — Encounter: Payer: Self-pay | Admitting: General Surgery

## 2013-03-30 ENCOUNTER — Encounter (HOSPITAL_COMMUNITY)
Admission: RE | Disposition: A | Discharge status: Home / Self Care | Payer: Self-pay | Source: Ambulatory Visit | Attending: Cardiology

## 2013-03-30 ENCOUNTER — Encounter (HOSPITAL_COMMUNITY): Payer: Self-pay | Admitting: Certified Registered Nurse Anesthetist

## 2013-03-30 ENCOUNTER — Encounter (HOSPITAL_COMMUNITY): Payer: Medicare Other | Admitting: Certified Registered"

## 2013-03-30 ENCOUNTER — Ambulatory Visit (INDEPENDENT_AMBULATORY_CARE_PROVIDER_SITE_OTHER): Payer: Medicare Other | Admitting: Pharmacist

## 2013-03-30 ENCOUNTER — Ambulatory Visit (HOSPITAL_COMMUNITY): Payer: Medicare Other | Admitting: Certified Registered"

## 2013-03-30 ENCOUNTER — Ambulatory Visit (HOSPITAL_COMMUNITY)
Admission: RE | Admit: 2013-03-30 | Discharge: 2013-03-30 | Disposition: A | Discharge status: Home / Self Care | Payer: Medicare Other | Source: Ambulatory Visit | Attending: Cardiology | Admitting: Cardiology

## 2013-03-30 DIAGNOSIS — I4891 Unspecified atrial fibrillation: Secondary | ICD-10-CM

## 2013-03-30 DIAGNOSIS — I359 Nonrheumatic aortic valve disorder, unspecified: Secondary | ICD-10-CM

## 2013-03-30 DIAGNOSIS — R0602 Shortness of breath: Secondary | ICD-10-CM | POA: Diagnosis not present

## 2013-03-30 DIAGNOSIS — E785 Hyperlipidemia, unspecified: Secondary | ICD-10-CM | POA: Diagnosis not present

## 2013-03-30 DIAGNOSIS — Z954 Presence of other heart-valve replacement: Secondary | ICD-10-CM | POA: Diagnosis not present

## 2013-03-30 DIAGNOSIS — F411 Generalized anxiety disorder: Secondary | ICD-10-CM | POA: Diagnosis not present

## 2013-03-30 DIAGNOSIS — F329 Major depressive disorder, single episode, unspecified: Secondary | ICD-10-CM | POA: Diagnosis not present

## 2013-03-30 DIAGNOSIS — I10 Essential (primary) hypertension: Secondary | ICD-10-CM | POA: Diagnosis not present

## 2013-03-30 DIAGNOSIS — Z7901 Long term (current) use of anticoagulants: Secondary | ICD-10-CM | POA: Diagnosis not present

## 2013-03-30 DIAGNOSIS — F3289 Other specified depressive episodes: Secondary | ICD-10-CM | POA: Insufficient documentation

## 2013-03-30 DIAGNOSIS — G4730 Sleep apnea, unspecified: Secondary | ICD-10-CM | POA: Insufficient documentation

## 2013-03-30 DIAGNOSIS — K219 Gastro-esophageal reflux disease without esophagitis: Secondary | ICD-10-CM | POA: Insufficient documentation

## 2013-03-30 DIAGNOSIS — E669 Obesity, unspecified: Secondary | ICD-10-CM | POA: Diagnosis not present

## 2013-03-30 HISTORY — PX: CARDIOVERSION: SHX1299

## 2013-03-30 LAB — BASIC METABOLIC PANEL
Calcium: 9.3 mg/dL (ref 8.4–10.5)
Chloride: 103 mEq/L (ref 96–112)
Creatinine, Ser: 0.75 mg/dL (ref 0.50–1.10)
GFR calc Af Amer: >90 mL/min (ref >90)
GFR calc non Af Amer: 86 mL/min — ABNORMAL LOW (ref >90)
Glucose, Bld: 104 mg/dL — ABNORMAL HIGH (ref 70–99)
Sodium: 139 mEq/L (ref 135–145)

## 2013-03-30 SURGERY — CARDIOVERSION
Anesthesia: General

## 2013-03-30 MED ORDER — SODIUM CHLORIDE 0.9 % IV SOLN
INTRAVENOUS | Status: DC | PRN
  Administered 2013-03-30: 10:00:00 via INTRAVENOUS

## 2013-03-30 MED ORDER — SODIUM CHLORIDE 0.9 % IV SOLN: INTRAVENOUS | Status: DC

## 2013-03-30 MED ORDER — LIDOCAINE HCL (CARDIAC) 20 MG/ML IV SOLN
INTRAVENOUS | Status: DC | PRN
  Administered 2013-03-30: 60 mg via INTRAVENOUS

## 2013-03-30 MED ORDER — PROPOFOL 10 MG/ML IV BOLUS
INTRAVENOUS | Status: DC | PRN
  Administered 2013-03-30: 100 mg via INTRAVENOUS

## 2013-03-30 NOTE — Interval H&P Note (Signed)
History and Physical Interval Note:  03/30/2013 10:15 AM  Marijo Sanes  has presented today for surgery, with the diagnosis of A FIB   The various methods of treatment have been discussed with the patient and family. After consideration of risks, benefits and other options for treatment, the patient has consented to  Procedure(s): CARDIOVERSION (N/A) as a surgical intervention .  The patient's history has been reviewed, patient examined, no change in status, stable for surgery.  I have reviewed the patient's chart and labs.  Questions were answered to the patient's satisfaction.     TURNER,TRACI R

## 2013-03-30 NOTE — Transfer of Care (Signed)
Immediate Anesthesia Transfer of Care Note  Patient: [name]  Procedure(s) Performed: Procedure(s): CARDIOVERSION (N/A)  Patient Location: PACU  Anesthesia Type:General  Level of Consciousness: awake, alert , oriented and patient cooperative  Airway & Oxygen Therapy: Patient Spontanous Breathing and Patient connected to nasal cannula oxygen  Post-op Assessment: Report given to PACU RN, Post -op Vital signs reviewed and stable and Patient moving all extremities  Post vital signs: Reviewed and stable  Complications: No apparent anesthesia complications, Dr Mayford Knife discussed tongue lesion r/t biting with pt. Denies discomfort at this time, bleeding has ceased.

## 2013-03-30 NOTE — CV Procedure (Signed)
Electrical Cardioversion Procedure Note [name] [id number] Aug 03, 1945  Procedure: Electrical Cardioversion Indications:  Atrial Fibrillation  Time Out: Verified patient identification, verified procedure,medications/allergies/relevent history reviewed, required imaging and test results available.  Performed  Procedure Details  The patient was NPO after midnight. Anesthesia was administered at the beside  by Dr.Jackson  with 100mg  of propofol and 60mg  IV Lidocaine.  Cardioversion was done with synchronized biphasic defibrillation with AP pads with 150watts which was no successful in converting patient.  She then was underwent 200watt biphasic synchronized defibrillation to NSR.  The patient converted to normal sinus rhythm. The patient tolerated the procedure well   IMPRESSION:  Successful cardioversion of atrial fibrillation    TURNER,TRACI R 03/30/2013, 11:07 AM

## 2013-03-30 NOTE — H&P (Signed)
ID: DOLLY HARBACH, DOB [birth year], MRN [id number]  PCP: Elie Confer, MD  Cardiologist: Griselda Miner, MD   History of Present Illness:  [name] is a 67 y.o. female with a history of afib s/p failed Tikosyn load and recurrent DCCV now on Amiodarone, OSA on CPAP, AS s/p mechanical AVR and chronic anticoagulation who presents today for followup. She says that she has been feeling better over the past few weeks. Her breathing has improved and she has less fatigue. She recently had a 2D echo which showed markedly dilated LA but normal LVF. She has been on Amiodarone for a few weeks now.   Wt Readings from Last 3 Encounters:   02/12/13  232 lb (105.235 kg)   01/20/13  230 lb 12.8 oz (104.69 kg)   01/15/13  231 lb (104.781 kg)    Past Medical History   Diagnosis  Date   .  Obesity    .  Hypertension    .  Aortic stenosis      status post aortic valve replacement with St. Jude mechanical prosthesis   .  Subdural hematoma      in setting of INR greater than 2.2 shortly after AVR - now cleared by neurosurgery to maintain INR 2-2.5   .  Dyslipidemia    .  Anxiety    .  Exertional shortness of breath    .  Arthritis      "right little finger" (11/12/2012)   .  Depression    .  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  Sleep apnea      "BiPAP" (11/12/2012)   .  GERD (gastroesophageal reflux disease)      "several years ago; none since" (11/12/2012)   .  Chronic anticoagulation    .  Hyperlipidemia    .  Persistent atrial fibrillation      4/14 TEE cardioversion success but return to Atrial fibrillation on 07/29/12 then S/P TEE/DCCV after Tikosyn load and now back in atrial fibrillation    Current Outpatient Prescriptions   Medication  Sig  Dispense  Refill   .  amiodarone (PACERONE) 200 MG tablet  Take 1 tablet (200 mg total) by mouth 2 (two) times daily.  60 tablet  3   .  calcium-vitamin D (OSCAL WITH D) 500-200 MG-UNIT per tablet  Take  1 tablet by mouth daily.     .  cholecalciferol (VITAMIN D) 1000 UNITS tablet  Take 1,000 Units by mouth daily.     .  furosemide (LASIX) 80 MG tablet  Take 1 tablet (80 mg total) by mouth daily.  30 tablet  12   .  losartan (COZAAR) 50 MG tablet  Take 50 mg by mouth daily.     .  Multiple Vitamin (MULTIVITAMIN WITH MINERALS) TABS  Take 1 tablet by mouth daily.     .  potassium chloride SA (K-DUR,KLOR-CON) 20 MEQ tablet  Take 60 mEq by mouth every morning.     .  sertraline (ZOLOFT) 100 MG tablet  Take 150 mg by mouth at bedtime.     .  TAZTIA XT 300 MG 24 hr capsule  TAKE 1 CAPSULE BY MOUTH EVERY DAY  30 capsule  5   .  tretinoin (RETIN-A) 0.05 % cream  Apply 1 Applicatorful topically at bedtime.     Marland Kitchen  warfarin (COUMADIN) 1 MG tablet  Take 4-5 mg by mouth See admin  instructions. Takes 4mg  on Mon & Fri & 5mg  on all other days in the evening     .  zolpidem (AMBIEN) 5 MG tablet  Take 5 mg by mouth at bedtime as needed for sleep.      No current facility-administered medications for this visit.   Allergies:  Allergies   Allergen  Reactions   .  Codeine  Anaphylaxis     jerking   .  Beta Adrenergic Blockers      Makes her crazy   .  Toprol Xl [Metoprolol Tartrate]      depression   .   Dilantin [Phenytoin Sodium Extended]  Rash   Social History: The patient reports that she has never smoked. She has never used smokeless tobacco. She reports that she drinks alcohol. She reports that she does not use illicit drugs.  Family History: The patient's family history includes Heart disease in her brother; Hyperlipidemia in her father; Hypertension in her father and mother; Lung cancer in her mother.  ROS: Please see the history of present illness. All other systems reviewed and negative.  PHYSICAL EXAM:  VS: BP 120/84  Pulse 100  Ht 5\' 7"  (1.702 m)  Wt 232 lb (105.235 kg)  BMI 36.33 kg/m2  Well nourished, well developed, in no acute distress  HEENT: normal  Neck: no JVD  Cardiac: normal S1,  S2; RRR; no murmur  Lungs: clear to auscultation bilaterally, no wheezing, rhonchi or rales  Abd: soft, nontender, no hepatomegaly  Ext: no edema  Skin: warm and dry  Neuro: CNs 2-12 intact, no focal abnormalities noted   EKG: Atrial flutter with 2:1 block and RBBB   ASSESSMENT:  1. Atrial fibrillation now in atrial flutter on Amiodarone load  2. Chronic systemic anticoagulation  3. OSA on CPAP therapy  4. Obesity  5. AS s/p mechanical AVR  6. Chronic diastolic CHF - compensated  7. HTN well controlled    PLAN:   DCCV today

## 2013-03-30 NOTE — Anesthesia Procedure Notes (Addendum)
Procedure Name: MAC Date/Time: 03/30/2013 11:07 AM Performed by: Angelica Pou Pre-anesthesia Checklist: Patient identified, Timeout performed, Emergency Drugs available, Suction available and Patient being monitored Patient Re-evaluated:Patient Re-evaluated prior to inductionOxygen Delivery Method: Ambu bag Preoxygenation: Pre-oxygenation with 100% oxygen Intubation Type: IV induction Ventilation: Two handed mask ventilation required and Oral airway inserted - appropriate to patient size Dental Injury: Teeth and Oropharynx as per pre-operative assessment  Comments: Small amount of blood noted in posterior oropharynx r/t pt biting tongue during CV.  Small lesion present on R side of tongue.

## 2013-03-30 NOTE — Anesthesia Preprocedure Evaluation (Addendum)
Anesthesia Evaluation  Patient identified by MRN, date of birth, ID band Patient awake    Reviewed: Allergy & Precautions, H&P , NPO status , Patient's Chart, lab work & pertinent test results, reviewed documented beta blocker date and time   Airway Mallampati: II TM Distance: >3 FB Neck ROM: Full    Dental  (+) Teeth Intact and Dental Advisory Given   Pulmonary sleep apnea, Continuous Positive Airway Pressure Ventilation and Oxygen sleep apnea ,  2L O2 at night only   Pulmonary exam normal       Cardiovascular hypertension, Pt. on medications +CHF + dysrhythmias Atrial Fibrillation Rhythm:Irregular  02/03/2013 Echo EF 50%   Neuro/Psych    GI/Hepatic GERD-  Medicated and Controlled,  Endo/Other    Renal/GU      Musculoskeletal   Abdominal   Peds  Hematology   Anesthesia Other Findings   Reproductive/Obstetrics                      Anesthesia Physical Anesthesia Plan  ASA: III  Anesthesia Plan: General   Post-op Pain Management:    Induction: Intravenous  Airway Management Planned: Mask and Natural Airway  Additional Equipment:   Intra-op Plan:   Post-operative Plan:   Informed Consent:   Plan Discussed with:   Anesthesia Plan Comments:        Anesthesia Quick Evaluation                                  Anesthesia Evaluation    Airway       Dental   Pulmonary          Cardiovascular hypertension, Pt. on home beta blockers and Pt. on medications +CHF + dysrhythmias Atrial Fibrillation  4/10 BNP: 1294 AVR 2006- intracranial bleed 6 weeks after surgery   Neuro/Psych    GI/Hepatic GERD-  ,  Endo/Other    Renal/GU      Musculoskeletal   Abdominal   Peds  Hematology   Anesthesia Other Findings   Reproductive/Obstetrics                           Anesthesia Physical Anesthesia Plan Anesthesia Quick Evaluation

## 2013-03-30 NOTE — Preoperative (Signed)
Beta Blockers   Reason not to administer Beta Blockers:Not Applicable 

## 2013-03-30 NOTE — Anesthesia Postprocedure Evaluation (Signed)
  Anesthesia Post-op Note  Patient: [name]  Procedure(s) Performed: Procedure(s): CARDIOVERSION (N/A)  Patient Location: PACU  Anesthesia Type:General  Level of Consciousness: awake, alert , oriented and patient cooperative  Airway and Oxygen Therapy: Patient Spontanous Breathing and Patient connected to nasal cannula oxygen  Post-op Pain: none  Post-op Assessment: Post-op Vital signs reviewed, Patient's Cardiovascular Status Stable, Respiratory Function Stable and Patent Airway  Post-op Vital Signs: Reviewed and stable  Complications: No apparent anesthesia complications

## 2013-03-31 ENCOUNTER — Encounter (HOSPITAL_COMMUNITY): Payer: Self-pay | Admitting: Cardiology

## 2013-04-01 ENCOUNTER — Other Ambulatory Visit: Payer: Self-pay | Admitting: General Surgery

## 2013-04-01 MED ORDER — AMIODARONE HCL 200 MG PO TABS: 200.0000 mg | ORAL_TABLET | Freq: Two times a day (BID) | ORAL | Status: DC

## 2013-04-01 NOTE — Telephone Encounter (Signed)
New Message  Pt called. She has questions about her Amiodarone dosage. She says she was advised to take two per day.. The script says to take one per day.. Please call back to confirm medication dosage.

## 2013-04-01 NOTE — Telephone Encounter (Signed)
SPoke with pt and made aware of dosage and sent in a new rx for pt.

## 2013-04-08 ENCOUNTER — Ambulatory Visit (INDEPENDENT_AMBULATORY_CARE_PROVIDER_SITE_OTHER): Payer: Medicare Other | Admitting: Pharmacist

## 2013-04-08 DIAGNOSIS — Z954 Presence of other heart-valve replacement: Secondary | ICD-10-CM | POA: Diagnosis not present

## 2013-04-08 DIAGNOSIS — I359 Nonrheumatic aortic valve disorder, unspecified: Secondary | ICD-10-CM | POA: Diagnosis not present

## 2013-04-08 DIAGNOSIS — I4891 Unspecified atrial fibrillation: Secondary | ICD-10-CM

## 2013-04-08 LAB — POCT INR: INR: 1.9

## 2013-04-14 ENCOUNTER — Encounter: Payer: Self-pay | Admitting: Nurse Practitioner

## 2013-04-14 ENCOUNTER — Ambulatory Visit (INDEPENDENT_AMBULATORY_CARE_PROVIDER_SITE_OTHER): Payer: Medicare Other | Admitting: Nurse Practitioner

## 2013-04-14 ENCOUNTER — Ambulatory Visit (INDEPENDENT_AMBULATORY_CARE_PROVIDER_SITE_OTHER): Payer: Medicare Other | Admitting: Pharmacist

## 2013-04-14 VITALS — BP 110/68 | HR 78 | Ht 67.0 in | Wt 242.8 lb

## 2013-04-14 DIAGNOSIS — I4891 Unspecified atrial fibrillation: Secondary | ICD-10-CM

## 2013-04-14 DIAGNOSIS — I359 Nonrheumatic aortic valve disorder, unspecified: Secondary | ICD-10-CM | POA: Diagnosis not present

## 2013-04-14 DIAGNOSIS — Z954 Presence of other heart-valve replacement: Secondary | ICD-10-CM

## 2013-04-14 LAB — POCT INR: INR: 2.3

## 2013-04-14 MED ORDER — WARFARIN SODIUM 4 MG PO TABS: ORAL_TABLET | ORAL | Status: DC

## 2013-04-14 NOTE — Patient Instructions (Addendum)
Stay on your current medicines for now  We will get your results from your breathing test  See Dr. Rayann Heman for discussion about your atrial fib  Call the [place] office at [phone] if you have any questions, problems or concerns.

## 2013-04-14 NOTE — Progress Notes (Signed)
[name] Date of Birth: 07-04-1945 Medical Record #[id number]  History of Present Illness: [name] is a 68 y.o. female with a history of afib s/p failed Tikosyn load and recurrent DCCV now on Amiodarone, OSA on CPAP, AS s/p mechanical AVR and chronic anticoagulation who presents today for followup. Seen for Dr. Radford Pax. Her other problems include obesity, HTN, HLD and past subdural hematoma.   Most recently for repeat cardioversion right before Christmas - initially successful.  Comes back today. Here alone. Has good days and bad days. Really feels not different since her procedure. Gives out easily. Not dizzy or lightheaded. Still has not heard about her PFTs. No chest pain.    Current Outpatient Prescriptions  Medication Sig Dispense Refill  . amiodarone (PACERONE) 200 MG tablet Take 1 tablet (200 mg total) by mouth 2 (two) times daily.  60 tablet  3  . calcium-vitamin D (OSCAL WITH D) 500-200 MG-UNIT per tablet Take 1 tablet by mouth daily.      . cholecalciferol (VITAMIN D) 1000 UNITS tablet Take 1,000 Units by mouth daily.      Marland Kitchen diltiazem (TIAZAC) 360 MG 24 hr capsule Take 1 capsule (360 mg total) by mouth daily.  30 capsule  5  . furosemide (LASIX) 80 MG tablet Take 1 tablet (80 mg total) by mouth daily.  30 tablet  12  . HYDROcodone-acetaminophen (NORCO/VICODIN) 5-325 MG per tablet Take 1 tablet by mouth every 4 (four) hours as needed.       Marland Kitchen losartan (COZAAR) 50 MG tablet TAKE 1 TABLET BY MOUTH DAILY  90 tablet  0  . Multiple Vitamin (MULTIVITAMIN WITH MINERALS) TABS Take 1 tablet by mouth daily.      . potassium chloride SA (K-DUR,KLOR-CON) 20 MEQ tablet Take 60 mEq by mouth every morning.       . sertraline (ZOLOFT) 100 MG tablet Take 150 mg by mouth at bedtime.      . tretinoin (RETIN-A) 0.05 % cream Apply 1 Applicatorful topically at bedtime.      Marland Kitchen warfarin (COUMADIN) 4 MG tablet Take 1 tablet on all days of the week or as directed by [facility].   105 tablet  1  . zolpidem (AMBIEN) 5 MG tablet Take 5 mg by mouth at bedtime as needed for sleep.       No current facility-administered medications for this visit.    Allergies  Allergen Reactions  . Codeine Anaphylaxis    jerking  . Beta Adrenergic Blockers     Makes her crazy  . Toprol Xl [Metoprolol Tartrate]     depression  . Dilantin [Phenytoin Sodium Extended] Rash    Past Medical History  Diagnosis Date  . Obesity   . Hypertension   . Aortic stenosis     status post aortic valve replacement with St. Jude mechanical prosthesis  . Subdural hematoma     in setting of INR greater than 2.2 shortly after AVR - now cleared by neurosurgery to maintain INR 2-2.5  . Dyslipidemia   . Anxiety   . Exertional shortness of breath   . Arthritis     "right little finger" (11/12/2012)  . Depression   . 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 Sleep apnea     "BiPAP" (11/12/2012)  . GERD (gastroesophageal reflux disease)     "several years ago; none since" (11/12/2012)  . Chronic anticoagulation   . Hyperlipidemia   .  Persistent atrial fibrillation     4/14 TEE cardioversion success but return to Atrial fibrillation on 07/29/12 then S/P TEE/DCCV after Tikosyn load and now back in atrial fibrillation    Past Surgical History  Procedure Laterality Date  . Burr hole for subdural hematoma  2006  . Knee arthroscopy Left 1980's    "2" (11/12/2012)  . Tee without cardioversion N/A 07/22/2012    Procedure: TRANSESOPHAGEAL ECHOCARDIOGRAM (TEE);  Surgeon: [name], MD;  Location[facility] ENDOSCOPY;  Service: Cardiovascular;  Laterality: N/A;  Rm 2034  . Cardioversion N/A 07/22/2012    Procedure: CARDIOVERSION;  Surgeon: [name], MD;  Location: [facility] ENDOSCOPY;  Service: Cardiovascular;  Laterality: N/A;  . Knee surgery Left 1980's    "after 2 scopes they went in and did some kind of OR" (11/12/2012)  . Tubal ligation  1980's  . Cardiac valve replacement  2006     St. Jude AVR  . Cardioversion N/A 11/25/2012    Procedure: CARDIOVERSION;  Surgeon: [name], MD;  Location: [place];  Service: Cardiovascular;  Laterality: N/A;  . Cardioversion N/A 12/30/2012    Procedure: CARDIOVERSION;  Surgeon: [name], MD;  Location: [facility] ENDOSCOPY;  Service: Cardiovascular;  Laterality: N/A;  h&p in file-HW   . Cardioversion N/A 03/30/2013    Procedure: CARDIOVERSION;  Surgeon: [name], MD;  Location: [place] ENDOSCOPY;  Service: Cardiovascular;  Laterality: N/A;    History  Smoking status  . Never Smoker   Smokeless tobacco  . Never Used    History  Alcohol Use  . Yes    Comment: 11/12/2012 "no alcohol in the last 9 years"    Family History  Problem Relation Age of Onset  . Lung cancer Mother   . Hypertension Mother   . Hyperlipidemia Father   . Hypertension Father   . Heart disease Brother     Review of Systems: The review of systems is per the HPI.  All other systems were reviewed and are negative.  Physical Exam: BP 110/68  Pulse 78  Ht 5\' 7"  (1.702 m)  Wt 242 lb 12.8 oz (110.133 kg)  BMI 38.02 kg/m2 Patient is very pleasant and in no acute distress. She is obese. Skin is warm and dry. Color is normal.  HEENT is unremarkable. Normocephalic/atraumatic. PERRL. Sclera are nonicteric. Neck is supple. No masses. No JVD. Lungs are clear. Cardiac exam shows an irregular rhythm. Rate is ok. Abdomen is soft. Extremities are without edema. Gait and ROM are intact. No gross neurologic deficits noted.  LABORATORY DATA: EKG today shows atrial fib with a contolled VR.   Lab Results  Component Value Date   WBC 6.6 03/23/2013   HGB 12.9 03/23/2013   HCT 38.7 03/23/2013   PLT 287.0 03/23/2013   GLUCOSE 104* 03/30/2013   ALT 22 11/12/2012   AST 26 11/12/2012   NA 139 03/30/2013   K 3.9 03/30/2013   CL 103 03/30/2013   CREATININE 0.75 03/30/2013   BUN 18 03/30/2013   CO2 28 03/30/2013   TSH 2.48 01/20/2013   INR 2.3 04/14/2013    Procedure: Electrical Cardioversion  Indications: Atrial Fibrillation  Time Out: Verified patient identification, verified procedure,medications/allergies/relevent history reviewed, required imaging and test results available. Performed  Procedure Details  The patient was NPO after midnight. Anesthesia was administered at the beside by Dr.Jackson with 100mg  of propofol and 60mg  IV Lidocaine. Cardioversion was done with synchronized biphasic defibrillation with AP pads with 150watts which was no successful in  converting patient. She then was underwent 200watt biphasic synchronized defibrillation to NSR. The patient converted to normal sinus rhythm. The patient tolerated the procedure well  IMPRESSION:  Successful cardioversion of atrial fibrillation  TURNER,TRACI R  03/30/2013, 11:07 AM   Assessment / Plan:  1. PAF - failed on Tikosyn, now failed on amiodarone - remains on anticoagulation - her rate is well controlled - have discussed with Dr. Radford Pax who is here in the office today - she has advised getting Keandra back to see Dr. Rayann Heman for consideration of ablative therapy.  I have left her on her amiodarone for now in case he would want to reattempt cardioversion. Will try to locate her PFTs as well.    2. Chronic anticoagulation - no problems noted  3. Obesity - she is interested in trying to lose weight - thinking about calling maintenance program for cardiac rehab.   Patient is agreeable to this plan and will call if any problems develop in the interim.   Burtis Junes, RN, [place] [address] [place], [place]  [address]

## 2013-04-16 ENCOUNTER — Telehealth: Payer: Self-pay | Admitting: Cardiology

## 2013-04-16 NOTE — Telephone Encounter (Signed)
New message     Talk to [name] only----change in medical status

## 2013-04-16 NOTE — Telephone Encounter (Signed)
Patient is back in AFib.  Failed DCCV on amiodarone most recently, and will see Dr. Rayann Heman in 4 weeks to discuss options.  Will check INRs weekly until then given her erratic INR history.  Patient notified and appointment made.

## 2013-04-22 ENCOUNTER — Ambulatory Visit (INDEPENDENT_AMBULATORY_CARE_PROVIDER_SITE_OTHER): Payer: Medicare Other | Admitting: Pharmacist

## 2013-04-22 DIAGNOSIS — Z954 Presence of other heart-valve replacement: Secondary | ICD-10-CM

## 2013-04-22 DIAGNOSIS — I359 Nonrheumatic aortic valve disorder, unspecified: Secondary | ICD-10-CM | POA: Diagnosis not present

## 2013-04-22 DIAGNOSIS — I4891 Unspecified atrial fibrillation: Secondary | ICD-10-CM | POA: Diagnosis not present

## 2013-04-22 LAB — POCT INR: INR: 2.6

## 2013-04-23 ENCOUNTER — Other Ambulatory Visit: Payer: Self-pay | Admitting: Cardiology

## 2013-04-29 ENCOUNTER — Telehealth: Payer: Self-pay | Admitting: Pharmacist

## 2013-04-29 ENCOUNTER — Ambulatory Visit (INDEPENDENT_AMBULATORY_CARE_PROVIDER_SITE_OTHER): Payer: Medicare Other | Admitting: *Deleted

## 2013-04-29 DIAGNOSIS — I359 Nonrheumatic aortic valve disorder, unspecified: Secondary | ICD-10-CM

## 2013-04-29 DIAGNOSIS — I4891 Unspecified atrial fibrillation: Secondary | ICD-10-CM

## 2013-04-29 DIAGNOSIS — Z954 Presence of other heart-valve replacement: Secondary | ICD-10-CM | POA: Diagnosis not present

## 2013-04-29 LAB — POCT INR: INR: 2.4

## 2013-04-29 MED ORDER — AMIODARONE HCL 200 MG PO TABS: 200.0000 mg | ORAL_TABLET | Freq: Every day | ORAL | Status: DC

## 2013-04-29 NOTE — Telephone Encounter (Signed)
We changed her back to 200 Once a day. I sent rx in for pt and pt is aware. To Ysidro Evert to make aware.

## 2013-04-29 NOTE — Telephone Encounter (Signed)
TO Dr Turner to advise.  

## 2013-04-29 NOTE — Telephone Encounter (Signed)
Please have her go back down to once daily since it is for rate control - she failed rhythm control

## 2013-04-29 NOTE — Telephone Encounter (Signed)
Patient notified to increase warfarin to 4 mg qd except 5 mg T/Th/Sat since amiodarone dose being reduced and we are needing to keep INR > 2.0 weekly.  Recheck INR next week as scheduled.

## 2013-04-29 NOTE — Telephone Encounter (Signed)
Patient tells me that her prescription for amiodarone is only for 1 daily (#30), but she is still taking the amiodarone 200 mg bid as Dr. Radford Pax increased this due to a low amio level in the past. She will see Dr. Rayann Heman next month to consider cardioversion or ablation.  Patient goes to [facility] on [address].  She would like a 90 day supply.  Please confirm with Dr. Radford Pax if she wants amiodarone to continue at 200 mg bid (or wants this reduced).  Based on this decision, please send in the correct quantity for a 90 day supply to [facility] and let patient know what dose she should take.    Patient phone number [phone]

## 2013-05-07 ENCOUNTER — Ambulatory Visit (INDEPENDENT_AMBULATORY_CARE_PROVIDER_SITE_OTHER): Payer: Medicare Other | Admitting: Pharmacist

## 2013-05-07 DIAGNOSIS — Z954 Presence of other heart-valve replacement: Secondary | ICD-10-CM | POA: Diagnosis not present

## 2013-05-07 DIAGNOSIS — I4891 Unspecified atrial fibrillation: Secondary | ICD-10-CM

## 2013-05-07 DIAGNOSIS — I359 Nonrheumatic aortic valve disorder, unspecified: Secondary | ICD-10-CM | POA: Diagnosis not present

## 2013-05-07 DIAGNOSIS — Z5181 Encounter for therapeutic drug level monitoring: Secondary | ICD-10-CM | POA: Diagnosis not present

## 2013-05-07 LAB — POCT INR: INR: 2.8

## 2013-05-14 ENCOUNTER — Ambulatory Visit (INDEPENDENT_AMBULATORY_CARE_PROVIDER_SITE_OTHER): Payer: Medicare Other | Admitting: Internal Medicine

## 2013-05-14 ENCOUNTER — Ambulatory Visit (INDEPENDENT_AMBULATORY_CARE_PROVIDER_SITE_OTHER): Payer: Medicare Other | Admitting: Pharmacist

## 2013-05-14 ENCOUNTER — Encounter: Payer: Self-pay | Admitting: Internal Medicine

## 2013-05-14 VITALS — BP 124/78 | HR 79 | Ht 67.0 in | Wt 245.4 lb

## 2013-05-14 DIAGNOSIS — I359 Nonrheumatic aortic valve disorder, unspecified: Secondary | ICD-10-CM

## 2013-05-14 DIAGNOSIS — I4891 Unspecified atrial fibrillation: Secondary | ICD-10-CM | POA: Diagnosis not present

## 2013-05-14 DIAGNOSIS — I10 Essential (primary) hypertension: Secondary | ICD-10-CM | POA: Diagnosis not present

## 2013-05-14 DIAGNOSIS — G4733 Obstructive sleep apnea (adult) (pediatric): Secondary | ICD-10-CM

## 2013-05-14 DIAGNOSIS — Z954 Presence of other heart-valve replacement: Secondary | ICD-10-CM

## 2013-05-14 DIAGNOSIS — Z5181 Encounter for therapeutic drug level monitoring: Secondary | ICD-10-CM

## 2013-05-14 DIAGNOSIS — Z7901 Long term (current) use of anticoagulants: Secondary | ICD-10-CM

## 2013-05-14 LAB — POCT INR: INR: 3

## 2013-05-14 NOTE — Patient Instructions (Addendum)
Your physician has recommended that you have an ablation. Catheter ablation is a medical procedure used to treat some cardiac arrhythmias (irregular heartbeats). During catheter ablation, a long, thin, flexible tube is put into a blood vessel in your groin (upper thigh), or neck. This tube is called an ablation catheter. It is then guided to your heart through the blood vessel. Radio frequency waves destroy small areas of heart tissue where abnormal heartbeats may cause an arrhythmia to start. Please see the instruction sheet given to you today.    Call Leonia Reader [phone] if you decide to proceed

## 2013-05-21 ENCOUNTER — Telehealth: Payer: Self-pay | Admitting: Cardiology

## 2013-05-21 ENCOUNTER — Ambulatory Visit (INDEPENDENT_AMBULATORY_CARE_PROVIDER_SITE_OTHER): Payer: Medicare Other | Admitting: Pharmacist

## 2013-05-21 DIAGNOSIS — I4891 Unspecified atrial fibrillation: Secondary | ICD-10-CM

## 2013-05-21 DIAGNOSIS — Z954 Presence of other heart-valve replacement: Secondary | ICD-10-CM | POA: Diagnosis not present

## 2013-05-21 DIAGNOSIS — I359 Nonrheumatic aortic valve disorder, unspecified: Secondary | ICD-10-CM

## 2013-05-21 DIAGNOSIS — Z5181 Encounter for therapeutic drug level monitoring: Secondary | ICD-10-CM

## 2013-05-21 LAB — POCT INR: INR: 2

## 2013-05-21 NOTE — Telephone Encounter (Signed)
New Problem:  Pt would like a call back from Dr. Radford Pax to discuss having an ablation per Dr. Rayann Heman. Pt would like to know Dr. Theodosia Blender opinion on having the procedure or not.

## 2013-05-21 NOTE — Telephone Encounter (Signed)
If Dr. Rayann Heman thinks she is a goo candidate I would recommend proceeding the procedure. Please make sure she has followup with Dr. Rayann Heman

## 2013-05-21 NOTE — Telephone Encounter (Signed)
Pt is aware but she stated she didn't feel Dr Rayann Heman really did tell her he thought she should do it. But she said she has an appt on the 19th with Dr Radford Pax and she would talk to her a little bit more about it then before she made a decision on moving forward or not.

## 2013-05-21 NOTE — Telephone Encounter (Signed)
PT made aware and said she would contact Dr. Bonita Quin nurse about following up to ask further questions.

## 2013-05-21 NOTE — Telephone Encounter (Signed)
I would like her to see Dr. Rayann Heman back to discuss ablation further

## 2013-05-21 NOTE — Telephone Encounter (Signed)
To Dr Turner to advise 

## 2013-05-23 ENCOUNTER — Other Ambulatory Visit: Payer: Self-pay | Admitting: Cardiology

## 2013-05-24 NOTE — Progress Notes (Signed)
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 female with a h/o obesity, OSA, s/p mechanical AVR, and persistent afib who presents for EP consultation.  She reports initially being diagnosed with atrial fibrillation 4/14 after presenting to Dr [name] office with symptoms of fatigue and decreased exercise tolerance.  She was noted to be in afib with RVR at that time.  She tried beta blockers but did not tolerate this due to "a reaction".  She has since been rate controlled with diltiazem.  She was cardioverted 4/14 but returned to afib the next day.  She was therefore placed on tikosyn.  She had persistent afib and therefore was placed on tikosyn.  She was again cardioverted but immediately returned to afib.  She has been in afib ever since.  She reports symptoms of fatigue and decreased exercise tolerance but is unaware of palpitations.  She was recently seen by me and initiated on amiodarone.  Unfortunately she remains in afib.  She is tolerating amiodarone without difficulty.  Today, she denies symptoms of chest pain, orthopnea, PND, dizziness, presyncope, syncope, or neurologic sequela. She has occasional leg swelling.   The patient is tolerating medications without difficulties and is otherwise without complaint today.   Past Medical History  Diagnosis Date  . Obesity   . Hypertension   . Aortic stenosis     status post aortic valve replacement with St. Jude mechanical prosthesis  . Subdural hematoma     in setting of INR greater than 2.2 shortly after AVR - now cleared by neurosurgery to maintain INR 2-2.5  . Dyslipidemia   . Anxiety   . Exertional shortness of breath   . Arthritis     "right little finger" (11/12/2012)  . Depression   . 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 Sleep apnea     "BiPAP" (11/12/2012)  . GERD (gastroesophageal reflux disease)     "several years ago; none  since" (11/12/2012)  . Chronic anticoagulation   . Hyperlipidemia   . Persistent atrial fibrillation     4/14 TEE cardioversion success but return to Atrial fibrillation on 07/29/12 then S/P TEE/DCCV after Tikosyn load and now back in atrial fibrillation   Past Surgical History  Procedure Laterality Date  . Burr hole for subdural hematoma  2006  . Knee arthroscopy Left 1980's    "2" (11/12/2012)  . Tee without cardioversion N/A 07/22/2012    Procedure: TRANSESOPHAGEAL ECHOCARDIOGRAM (TEE);  Surgeon: [name], MD;  Location: [facility] ENDOSCOPY;  Service: Cardiovascular;  Laterality: N/A;  Rm 2034  . Cardioversion N/A 07/22/2012    Procedure: CARDIOVERSION;  Surgeon: [name], MD;  Location: [facility] ENDOSCOPY;  Service: Cardiovascular;  Laterality: N/A;  . Knee surgery Left 1980's    "after 2 scopes they went in and did some kind of OR" (11/12/2012)  . Tubal ligation  1980's  . Cardiac valve replacement  2006    St. Jude AVR  . Cardioversion N/A 11/25/2012    Procedure: CARDIOVERSION;  Surgeon: [name], MD;  Location: [place];  Service: Cardiovascular;  Laterality: N/A;  . Cardioversion N/A 12/30/2012    Procedure: CARDIOVERSION;  Surgeon: [name], MD;  Location: [facility] ENDOSCOPY;  Service: Cardiovascular;  Laterality: N/A;  h&p in file-HW   . Cardioversion N/A 03/30/2013    Procedure: CARDIOVERSION;  Surgeon: [name], MD;  Location: [place];  Service:  Cardiovascular;  Laterality: N/A;    Current Outpatient Prescriptions  Medication Sig Dispense Refill  . amiodarone (PACERONE) 200 MG tablet Take 1 tablet (200 mg total) by mouth daily.  90 tablet  3  . calcium-vitamin D (OSCAL WITH D) 500-200 MG-UNIT per tablet Take 1 tablet by mouth daily.      . cholecalciferol (VITAMIN D) 1000 UNITS tablet Take 1,000 Units by mouth daily.      Marland Kitchen diltiazem (TIAZAC) 360 MG 24 hr capsule Take 1 capsule (360 mg total) by mouth daily.  30 capsule  5  . furosemide (LASIX) 80 MG tablet Take 1  tablet (80 mg total) by mouth daily.  30 tablet  12  . HYDROcodone-acetaminophen (NORCO/VICODIN) 5-325 MG per tablet Take 1 tablet by mouth every 4 (four) hours as needed.       Marland Kitchen losartan (COZAAR) 50 MG tablet TAKE 1 TABLET BY MOUTH DAILY  90 tablet  0  . Multiple Vitamin (MULTIVITAMIN WITH MINERALS) TABS Take 1 tablet by mouth daily.      . potassium chloride SA (K-DUR,KLOR-CON) 20 MEQ tablet Take 60 mEq by mouth every morning.       . sertraline (ZOLOFT) 100 MG tablet Take 150 mg by mouth at bedtime.      . tretinoin (RETIN-A) 0.05 % cream Apply 1 Applicatorful topically at bedtime.      Marland Kitchen warfarin (COUMADIN) 1 MG tablet Take 1 mg warfarin tablets along with 4 mg tablets as directed by coumadin clinic  90 tablet  0  . warfarin (COUMADIN) 4 MG tablet Take 1 tablet on all days of the week or as directed by Coumadin Clinic.  105 tablet  1  . zolpidem (AMBIEN) 5 MG tablet Take 5 mg by mouth at bedtime as needed for sleep.       No current facility-administered medications for this visit.    Allergies  Allergen Reactions  . Codeine Anaphylaxis    jerking  . Beta Adrenergic Blockers     Makes her crazy  . Toprol Xl [Metoprolol Tartrate]     depression  . Dilantin [Phenytoin Sodium Extended] Rash    History   Social History  . Marital Status: Married    Spouse Name: N/A    Number of Children: N/A  . Years of Education: N/A   Occupational History  . Not on file.   Social History Main Topics  . Smoking status: Never Smoker   . Smokeless tobacco: Never Used  . Alcohol Use: Yes     Comment: 11/12/2012 "no alcohol in the last 9 years"  . Drug Use: No  . Sexual Activity: Not Currently   Other Topics Concern  . Not on file   Social History Narrative   Pt lives in [place] with spouse.  Retired    Family History  Problem Relation Age of Onset  . Lung cancer Mother   . Hypertension Mother   . Hyperlipidemia Father   . Hypertension Father   . Heart disease Brother      ROS- All systems are reviewed and negative except as per the HPI above  Physical Exam: Filed Vitals:   05/14/13 1046  BP: 124/78  Pulse: 79  Height: 5\' 7"  (1.702 m)  Weight: 245 lb 6.4 oz (111.313 kg)    GEN- The patient is overweight appearing, alert and oriented x 3 today.   Head- normocephalic, atraumatic Eyes-  Sclera clear, conjunctiva pink Ears- hearing intact Oropharynx- clear Neck- supple, no JVP Lymph-  no cervical lymphadenopathy Lungs- Clear to ausculation bilaterally, normal work of breathing Heart- irregular rate and rhythm, mechanical S2 GI- soft, NT, ND, + BS Extremities- no clubbing, cyanosis, trace edema MS- no significant deformity or atrophy Skin- no rash or lesion Psych- euthymic mood, full affect Neuro- strength and sensation are intact  EKG today reveals afib, V rate 79 bpm, nonspecific St/T changes [place] Dates 4/14 is reviewed Dr [name] records are reviewed  Assessment and Plan:  1. Persistent afib The patient has symptomatic persistent afib.  She has failed medical therapy with beta blockers, calcium channel blockers, and tikosyn.  She remains in afib despite amiodarone. Therapeutic strategies for afib including medicine and ablation were discussed in detail with the patient today. Risk, benefits, and alternatives to EP study and radiofrequency ablation for afib were also discussed in detail today.  She is now more willing to consider ablation.  She will contemplate this further and contact our office if she decides to proceed.   2. HTN Stable No change required today  3. OSA Compliance with CPAP is encouraged  4. Obesity Weight loss advised  She will follow-up with Dr Radford Pax If she decides to proceed with ablation then I am happy to see her again at that time.

## 2013-05-28 ENCOUNTER — Encounter: Payer: Self-pay | Admitting: Cardiology

## 2013-05-28 ENCOUNTER — Ambulatory Visit (INDEPENDENT_AMBULATORY_CARE_PROVIDER_SITE_OTHER): Payer: Medicare Other | Admitting: Pharmacist

## 2013-05-28 ENCOUNTER — Ambulatory Visit (INDEPENDENT_AMBULATORY_CARE_PROVIDER_SITE_OTHER): Payer: Medicare Other | Admitting: Cardiology

## 2013-05-28 VITALS — BP 112/80 | HR 86 | Ht 67.0 in | Wt 244.0 lb

## 2013-05-28 DIAGNOSIS — I359 Nonrheumatic aortic valve disorder, unspecified: Secondary | ICD-10-CM

## 2013-05-28 DIAGNOSIS — Z7901 Long term (current) use of anticoagulants: Secondary | ICD-10-CM | POA: Diagnosis not present

## 2013-05-28 DIAGNOSIS — I5032 Chronic diastolic (congestive) heart failure: Secondary | ICD-10-CM

## 2013-05-28 DIAGNOSIS — I4891 Unspecified atrial fibrillation: Secondary | ICD-10-CM

## 2013-05-28 DIAGNOSIS — I350 Nonrheumatic aortic (valve) stenosis: Secondary | ICD-10-CM

## 2013-05-28 DIAGNOSIS — I10 Essential (primary) hypertension: Secondary | ICD-10-CM

## 2013-05-28 DIAGNOSIS — I509 Heart failure, unspecified: Secondary | ICD-10-CM | POA: Diagnosis not present

## 2013-05-28 DIAGNOSIS — Z954 Presence of other heart-valve replacement: Secondary | ICD-10-CM

## 2013-05-28 DIAGNOSIS — Z952 Presence of prosthetic heart valve: Secondary | ICD-10-CM

## 2013-05-28 DIAGNOSIS — Z5181 Encounter for therapeutic drug level monitoring: Secondary | ICD-10-CM

## 2013-05-28 DIAGNOSIS — G4733 Obstructive sleep apnea (adult) (pediatric): Secondary | ICD-10-CM

## 2013-05-28 LAB — BASIC METABOLIC PANEL
BUN: 20 mg/dL (ref 6–23)
CO2: 27 mEq/L (ref 19–32)
CREATININE: 0.9 mg/dL (ref 0.4–1.2)
Calcium: 10 mg/dL (ref 8.4–10.5)
Chloride: 103 mEq/L (ref 96–112)
GFR: 65.35 mL/min (ref >60.00)
Glucose, Bld: 85 mg/dL (ref 70–99)
Potassium: 4.3 mEq/L (ref 3.5–5.1)
Sodium: 142 mEq/L (ref 135–145)

## 2013-05-28 LAB — POCT INR: INR: 2.6

## 2013-05-28 NOTE — Progress Notes (Signed)
[address] [place], [place]  [address] Phone: [phone] Fax:  [phone]  Date:  05/28/2013   ID:  EZME DUCH, DOB [birth year], MRN [id number]  PCP:  [name], MD  Cardiologist:  Fransico Him, MD     History of Present Illness:[name] is a 68 y.o. female with a history of afib s/p failed Tikosyn load and recurrent DCCV which failed to maintain NSR.  She was loaded on amio and underwent recurrent DCCV to NSR 12/22 but this did not hold and she reverted back to afib by office visit on 04/14/2013.  Marland Kitchen  She also has a history of OSA on VPAP auto, AS s/p mechanical AVR and chronic anticoagulation who presents today for followup. She saw Dr. Rayann Heman on 05/14/13 for evaluation of possible ablation due to failing medical therapy with beta blockers/CCB/Tikosyn/amiodarone.  She told him she wanted to think about it and would get back to him.  At this time she does not want to pursue atrial fibrillation ablation.  She says that she is feeling much better.  Her SOB has improved and she only gets DOE when she overexerts herself.  She denies any LE edema, palpitations or dizziness.  She is doing well with her CPAP and says she cannot sleep without it.  She tolerates the nasal pillow mask and feels the pressure is adequate. She feels rested in the am and has no daytime sleepiness.    Wt Readings from Last 3 Encounters:  05/28/13 244 lb (110.678 kg)  05/14/13 245 lb 6.4 oz (111.313 kg)  04/14/13 242 lb 12.8 oz (110.133 kg)     Past Medical History  Diagnosis Date  . Obesity   . Hypertension   . Aortic stenosis     status post aortic valve replacement with St. Jude mechanical prosthesis  . Subdural hematoma     in setting of INR greater than 2.2 shortly after AVR - now cleared by neurosurgery to maintain INR 2-2.5  . Dyslipidemia   . Anxiety   . Exertional shortness of breath   . Arthritis     "right little finger" (11/12/2012)  . Depression   .  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 Sleep apnea     "BiPAP" (11/12/2012)  . GERD (gastroesophageal reflux disease)     "several years ago; none since" (11/12/2012)  . Chronic anticoagulation   . Hyperlipidemia   . Persistent atrial fibrillation - does not want afib ablation at this time     Current Outpatient Prescriptions  Medication Sig Dispense Refill  . amiodarone (PACERONE) 200 MG tablet Take 1 tablet (200 mg total) by mouth daily.  90 tablet  3  . calcium-vitamin D (OSCAL WITH D) 500-200 MG-UNIT per tablet Take 1 tablet by mouth daily.      . cholecalciferol (VITAMIN D) 1000 UNITS tablet Take 1,000 Units by mouth daily.      Marland Kitchen diltiazem (TIAZAC) 360 MG 24 hr capsule Take 1 capsule (360 mg total) by mouth daily.  30 capsule  5  . furosemide (LASIX) 80 MG tablet Take 1 tablet (80 mg total) by mouth daily.  30 tablet  12  . HYDROcodone-acetaminophen (NORCO/VICODIN) 5-325 MG per tablet Take 1 tablet by mouth every 4 (four) hours as needed.       Marland Kitchen losartan (COZAAR) 50 MG tablet TAKE 1 TABLET BY MOUTH DAILY  90 tablet  0  . Multiple Vitamin (  MULTIVITAMIN WITH MINERALS) TABS Take 1 tablet by mouth daily.      . potassium chloride SA (K-DUR,KLOR-CON) 20 MEQ tablet Take 20 mEq by mouth 2 (two) times daily.       . sertraline (ZOLOFT) 100 MG tablet Take 150 mg by mouth at bedtime.      . tretinoin (RETIN-A) 0.05 % cream Apply 1 Applicatorful topically at bedtime.      Marland Kitchen warfarin (COUMADIN) 1 MG tablet Take 1 mg warfarin tablets along with 4 mg tablets as directed by coumadin clinic  90 tablet  0  . warfarin (COUMADIN) 4 MG tablet Take 1 tablet on all days of the week or as directed by Coumadin Clinic.  105 tablet  1  . zolpidem (AMBIEN) 5 MG tablet Take 5 mg by mouth at bedtime as needed for sleep.       No current facility-administered medications for this visit.    Allergies:    Allergies  Allergen Reactions  . Codeine Anaphylaxis    jerking  .  Beta Adrenergic Blockers     Makes her crazy  . Toprol Xl [Metoprolol Tartrate]     depression  . Dilantin [Phenytoin Sodium Extended] Rash    Social History:  The patient  reports that she has never smoked. She has never used smokeless tobacco. She reports that she drinks alcohol. She reports that she does not use illicit drugs.   Family History:  The patient's family history includes Heart disease in her brother; Hyperlipidemia in her father; Hypertension in her father and mother; Lung cancer in her mother.   ROS:  Please see the history of present illness.      All other systems reviewed and negative.   PHYSICAL EXAM: VS:  BP 112/80  Pulse 86  Ht 5\' 7"  (3.532 m)  Wt 244 lb (110.678 kg)  BMI 38.21 kg/m2 Well nourished, well developed, in no acute distress HEENT: normal Neck: no JVD Cardiac:  normal S1, S2; irregularly irregular; no murmur Lungs:  clear to auscultation bilaterally, no wheezing, rhonchi or rales Abd: soft, nontender, no hepatomegaly Ext: trace dema Skin: warm and dry Neuro:  CNs 2-12 intact, no focal abnormalities noted    ASSESSMENT/PLAN:  1. Atrial fibrillation - she has failed medical therapy and several attempts at DCCV on medical therapy   - continue amio/warfarin/diltiazem 2. Chronic systemic anticoagulation  3. OSA on BiPAP ASV therapy  - her download today showed an AHI of 1.1/hr on Auto ASV and will continue on current settings.  Her compliance is 77% in using more than 4 hours nightly. 4. Obesity  5. AS s/p mechanical AVR  6. Chronic diastolic CHF - compensated    - continue Lasix  - check BMET 7. HTN well controlled    - continue losartan/diltiazem  Followup with me in 6 months   Signed, Fransico Him, MD 05/28/2013 10:45 AM

## 2013-05-28 NOTE — Patient Instructions (Signed)
Your physician recommends that you continue on your current medications as directed. Please refer to the Current Medication list given to you today.  Your physician recommends that you go to the lab today for a BMET  Your physician wants you to follow-up in: 6 Months with Dr Turner You will receive a reminder letter in the mail two months in advance. If you don't receive a letter, please call our office to schedule the follow-up appointment.     

## 2013-05-29 ENCOUNTER — Other Ambulatory Visit: Payer: Self-pay | Admitting: Dermatology

## 2013-05-29 DIAGNOSIS — L570 Actinic keratosis: Secondary | ICD-10-CM | POA: Diagnosis not present

## 2013-05-29 DIAGNOSIS — D1801 Hemangioma of skin and subcutaneous tissue: Secondary | ICD-10-CM | POA: Diagnosis not present

## 2013-05-29 DIAGNOSIS — L820 Inflamed seborrheic keratosis: Secondary | ICD-10-CM | POA: Diagnosis not present

## 2013-05-29 DIAGNOSIS — Z85828 Personal history of other malignant neoplasm of skin: Secondary | ICD-10-CM | POA: Diagnosis not present

## 2013-05-29 DIAGNOSIS — L819 Disorder of pigmentation, unspecified: Secondary | ICD-10-CM | POA: Diagnosis not present

## 2013-05-29 DIAGNOSIS — L821 Other seborrheic keratosis: Secondary | ICD-10-CM | POA: Diagnosis not present

## 2013-05-29 DIAGNOSIS — D485 Neoplasm of uncertain behavior of skin: Secondary | ICD-10-CM | POA: Diagnosis not present

## 2013-05-30 ENCOUNTER — Other Ambulatory Visit: Payer: Self-pay | Admitting: Cardiology

## 2013-06-08 ENCOUNTER — Ambulatory Visit (INDEPENDENT_AMBULATORY_CARE_PROVIDER_SITE_OTHER): Payer: Medicare Other | Admitting: Pharmacist

## 2013-06-08 DIAGNOSIS — I359 Nonrheumatic aortic valve disorder, unspecified: Secondary | ICD-10-CM

## 2013-06-08 DIAGNOSIS — Z5181 Encounter for therapeutic drug level monitoring: Secondary | ICD-10-CM

## 2013-06-08 DIAGNOSIS — I4891 Unspecified atrial fibrillation: Secondary | ICD-10-CM

## 2013-06-08 DIAGNOSIS — Z954 Presence of other heart-valve replacement: Secondary | ICD-10-CM

## 2013-06-08 LAB — POCT INR: INR: 3

## 2013-06-10 ENCOUNTER — Other Ambulatory Visit: Payer: Self-pay

## 2013-06-10 DIAGNOSIS — Z1231 Encounter for screening mammogram for malignant neoplasm of breast: Secondary | ICD-10-CM

## 2013-06-17 ENCOUNTER — Ambulatory Visit (INDEPENDENT_AMBULATORY_CARE_PROVIDER_SITE_OTHER): Payer: Medicare Other | Admitting: Pharmacist

## 2013-06-17 DIAGNOSIS — Z5181 Encounter for therapeutic drug level monitoring: Secondary | ICD-10-CM

## 2013-06-17 DIAGNOSIS — I359 Nonrheumatic aortic valve disorder, unspecified: Secondary | ICD-10-CM | POA: Diagnosis not present

## 2013-06-17 DIAGNOSIS — Z954 Presence of other heart-valve replacement: Secondary | ICD-10-CM

## 2013-06-17 DIAGNOSIS — I4891 Unspecified atrial fibrillation: Secondary | ICD-10-CM | POA: Diagnosis not present

## 2013-06-17 LAB — POCT INR: INR: 1.7

## 2013-06-24 ENCOUNTER — Ambulatory Visit (INDEPENDENT_AMBULATORY_CARE_PROVIDER_SITE_OTHER): Payer: Medicare Other | Admitting: Pharmacist

## 2013-06-24 DIAGNOSIS — I359 Nonrheumatic aortic valve disorder, unspecified: Secondary | ICD-10-CM

## 2013-06-24 DIAGNOSIS — Z954 Presence of other heart-valve replacement: Secondary | ICD-10-CM | POA: Diagnosis not present

## 2013-06-24 DIAGNOSIS — Z5181 Encounter for therapeutic drug level monitoring: Secondary | ICD-10-CM

## 2013-06-24 DIAGNOSIS — I4891 Unspecified atrial fibrillation: Secondary | ICD-10-CM

## 2013-06-24 LAB — POCT INR: INR: 2

## 2013-07-02 ENCOUNTER — Ambulatory Visit
Admission: RE | Admit: 2013-07-02 | Discharge: 2013-07-02 | Disposition: A | Discharge status: Home / Self Care | Payer: Medicare Other | Source: Ambulatory Visit

## 2013-07-02 DIAGNOSIS — Z1231 Encounter for screening mammogram for malignant neoplasm of breast: Secondary | ICD-10-CM | POA: Diagnosis not present

## 2013-07-14 ENCOUNTER — Ambulatory Visit (INDEPENDENT_AMBULATORY_CARE_PROVIDER_SITE_OTHER): Payer: Medicare Other | Admitting: Pharmacist

## 2013-07-14 DIAGNOSIS — I359 Nonrheumatic aortic valve disorder, unspecified: Secondary | ICD-10-CM | POA: Diagnosis not present

## 2013-07-14 DIAGNOSIS — Z954 Presence of other heart-valve replacement: Secondary | ICD-10-CM

## 2013-07-14 DIAGNOSIS — I4891 Unspecified atrial fibrillation: Secondary | ICD-10-CM

## 2013-07-14 DIAGNOSIS — Z5181 Encounter for therapeutic drug level monitoring: Secondary | ICD-10-CM | POA: Diagnosis not present

## 2013-07-14 LAB — POCT INR: INR: 2.4

## 2013-07-20 ENCOUNTER — Other Ambulatory Visit: Payer: Self-pay | Admitting: Cardiology

## 2013-07-22 ENCOUNTER — Encounter: Payer: Medicare Other | Admitting: Cardiology

## 2013-07-22 ENCOUNTER — Encounter: Payer: Self-pay | Admitting: Cardiology

## 2013-07-22 NOTE — Progress Notes (Signed)
This encounter was created in error - please disregard. This encounter was created in error - please disregard. This encounter was created in error - please disregard. 

## 2013-08-03 ENCOUNTER — Ambulatory Visit (INDEPENDENT_AMBULATORY_CARE_PROVIDER_SITE_OTHER): Payer: Medicare Other | Admitting: Pharmacist

## 2013-08-03 DIAGNOSIS — Z5181 Encounter for therapeutic drug level monitoring: Secondary | ICD-10-CM

## 2013-08-03 DIAGNOSIS — I359 Nonrheumatic aortic valve disorder, unspecified: Secondary | ICD-10-CM | POA: Diagnosis not present

## 2013-08-03 DIAGNOSIS — I4891 Unspecified atrial fibrillation: Secondary | ICD-10-CM

## 2013-08-03 DIAGNOSIS — Z954 Presence of other heart-valve replacement: Secondary | ICD-10-CM

## 2013-08-03 LAB — POCT INR: INR: 2.8

## 2013-08-10 ENCOUNTER — Ambulatory Visit (INDEPENDENT_AMBULATORY_CARE_PROVIDER_SITE_OTHER): Payer: Medicare Other | Admitting: Pharmacist

## 2013-08-10 DIAGNOSIS — Z5181 Encounter for therapeutic drug level monitoring: Secondary | ICD-10-CM | POA: Diagnosis not present

## 2013-08-10 DIAGNOSIS — I4891 Unspecified atrial fibrillation: Secondary | ICD-10-CM

## 2013-08-10 DIAGNOSIS — I359 Nonrheumatic aortic valve disorder, unspecified: Secondary | ICD-10-CM

## 2013-08-10 DIAGNOSIS — Z954 Presence of other heart-valve replacement: Secondary | ICD-10-CM

## 2013-08-10 LAB — POCT INR: INR: 2.1

## 2013-08-13 ENCOUNTER — Telehealth: Payer: Self-pay | Admitting: Internal Medicine

## 2013-08-13 NOTE — Telephone Encounter (Signed)
Spoke with patient who would like to proceed with scheduling afib ablation with Dr. Rayann Heman.  Patient last saw Dr. Rayann Heman in February.  His note at that time states that if patient decides to proceed with ablation, that he is happy to see her back in the office to set up ablation.  I advised patient that I am forwarding message to Dr. Rayann Heman, his primary nurse, Janan Halter, RN and to Lorenda Hatchet, his scheduler and that our office will be in touch by early next week to schedule her appointment.  Patient verbalized understanding and agreement.

## 2013-08-13 NOTE — Telephone Encounter (Signed)
New message      Talk to a nurse about cardiac ablation procedure

## 2013-08-14 ENCOUNTER — Ambulatory Visit (INDEPENDENT_AMBULATORY_CARE_PROVIDER_SITE_OTHER): Payer: Medicare Other | Admitting: Internal Medicine

## 2013-08-14 ENCOUNTER — Encounter: Payer: Self-pay | Admitting: Internal Medicine

## 2013-08-14 ENCOUNTER — Encounter: Payer: Self-pay | Admitting: *Deleted

## 2013-08-14 VITALS — BP 143/83 | HR 102 | Ht 67.0 in | Wt 246.0 lb

## 2013-08-14 DIAGNOSIS — Z01812 Encounter for preprocedural laboratory examination: Secondary | ICD-10-CM

## 2013-08-14 DIAGNOSIS — I10 Essential (primary) hypertension: Secondary | ICD-10-CM | POA: Diagnosis not present

## 2013-08-14 DIAGNOSIS — Z954 Presence of other heart-valve replacement: Secondary | ICD-10-CM

## 2013-08-14 DIAGNOSIS — I4891 Unspecified atrial fibrillation: Secondary | ICD-10-CM | POA: Diagnosis not present

## 2013-08-14 DIAGNOSIS — G4733 Obstructive sleep apnea (adult) (pediatric): Secondary | ICD-10-CM

## 2013-08-14 DIAGNOSIS — Z952 Presence of prosthetic heart valve: Secondary | ICD-10-CM

## 2013-08-14 DIAGNOSIS — Z7901 Long term (current) use of anticoagulants: Secondary | ICD-10-CM

## 2013-08-14 NOTE — Telephone Encounter (Signed)
Message copied by Bishop Limbo on Fri Aug 14, 2013  2:48 PM ------      Message from: Stanton Kidney      Created: Fri Aug 14, 2013  2:41 PM      Regarding: 4wk INR check       Pt scheduled for A Fib ablation on 7/2,      Need 4wk INR checks prior.            (we will arrrange TEE for 7/1) ------

## 2013-08-14 NOTE — Patient Instructions (Addendum)
Your physician recommends that you return for pre-procedure lab work in: 10/02/13  Your physician has requested that you have a TEE. During a TEE, sound waves are used to create images of your heart. It provides your doctor with information about the size and shape of your heart and how well your heart's chambers and valves are working. In this test, a transducer is attached to the end of a flexible tube that's guided down your throat and into your esophagus (the tube leading from you mouth to your stomach) to get a more detailed image of your heart. You are not awake for the procedure. Please see the instruction sheet given to you today. For further information please visit [email or web address].  Claiborne Billings will call you to schedule this procedure  Your physician has recommended that you have an ablation. Catheter ablation is a medical procedure used to treat some cardiac arrhythmias (irregular heartbeats). During catheter ablation, a long, thin, flexible tube is put into a blood vessel in your groin (upper thigh), or neck. This tube is called an ablation catheter. It is then guided to your heart through the blood vessel. Radio frequency waves destroy small areas of heart tissue where abnormal heartbeats may cause an arrhythmia to start. Please see the instruction sheet given to you today.  You are scheduled for 10/08/13   Cardiac Ablation Cardiac ablation is a procedure to disable a small amount of heart tissue in very specific places. The heart has many electrical connections. Sometimes these connections are abnormal and can cause the heart to beat very fast or irregularly. By disabling some of the problem areas, heart rhythm can be improved or made normal. Ablation is done for people who:   Have Wolff-Parkinson-White syndrome.   Have other fast heart rhythms (tachycardia).   Have taken medicines for an abnormal heart rhythm (arrhythmia) that resulted in:   No success.   Side effects.   May have  a high-risk heartbeat that could result in death.  LET [facility] CARE PROVIDER KNOW ABOUT:   Any allergies you have or any previous reactions you have had to X-ray dye, food (such as seafood), medicine, or tape.   All medicines you are taking, including vitamins, herbs, eye drops, creams, and over-the-counter medicines.   Previous problems you or members of your family have had with the use of anesthetics.   Any blood disorders you have.   Previous surgeries or procedures (such as a kidney transplant) you have had.   Medical conditions you have (such as kidney failure).  RISKS AND COMPLICATIONS Generally, cardiac ablation is a safe procedure. However, as with any procedure, complications can occur. Possible risks and complications include:   Increased risk of cancer. Depending on how long it takes to do the ablation, the dose of radiation can be high.  Bruising and bleeding where a thin, flexible tube (catheter) was inserted during the procedure.   Bleeding into the chest, especially into the sac that surrounds the heart (serious).  Need for a permanent pacemaker if the normal electrical system is damaged.   The procedure may not be fully effective, and this may not be recognized for months. Repeat ablation procedures are sometimes required. BEFORE THE PROCEDURE   Follow any instructions from your health care provider regarding eating and drinking before the procedure.   Take your medicines as directed at regular times with water, unless instructed otherwise by your health care provider. If you are taking diabetes medicine, including insulin, ask how you are to  take it and if there are any special instructions you should follow. It is common to adjust insulin dosing the day of the ablation.  PROCEDURE  An ablation is usually performed in a catheterization laboratory with the guidance of fluoroscopy. Fluoroscopy is a type of X-ray that helps your health care provider see  images of your heart during the procedure.   An ablation is a minimally invasive procedure. This means a small cut (incision) is made in either your neck or groin. Your health care provider will decide where to make the incision based on your medical history and physical exam.  An IV tube will be started before the procedure begins. You will be given an anesthetic or medicine to help you relax (sedative).  The skin on your neck or groin will be numbed. A needle will be inserted into a large vein in your neck or groin and catheters will be threaded to your heart.  A special dye that shows up on fluoroscopy pictures may be injected through the catheter. The dye helps your health care provider see the area of the heart that needs treatment.  The catheter has electrodes on the tip. When the area of heart tissue that is causing the arrhythmia is found, the catheter tip will send an electrical current to the area and "scar" the tissue. Three types of energy can be used to ablate the heart tissue:   Heat (radiofrequency energy).   Laser energy.   Extreme cold (cryoablation).   When the area of the heart has been ablated, the catheter will be taken out. Pressure will be held on the insertion site. This will help the insertion site clot and keep it from bleeding. A bandage will be placed on the insertion site.   An ablation procedure can take 1 6 hours to complete. AFTER THE PROCEDURE   After the procedure, you will be taken to a recovery area where your vital signs (blood pressure, heart rate, and breathing) will be monitored. The insertion site will also be monitored for bleeding.   You will need to lie still for 4 6 hours. This is to ensure you do not bleed from the catheter insertion site.   If your blood pressure and heart rate are stable and no bleeding occurs at the insertion site, you may go home the same day as your procedure.   If complications occur or your health care  provider feels you should be watched, you may need to stay in the hospital overnight.  Document Released: 08/12/2008 Document Revised: 11/26/2012 Document Reviewed: 08/18/2012 [facility] Patient Information 2014 [place], [place].

## 2013-08-14 NOTE — Telephone Encounter (Signed)
Patient due to have protime on 08/17/13.  Will get weekly INRs with goal of 2.0 or higher for procedure early July

## 2013-08-16 NOTE — Progress Notes (Signed)
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 female with a h/o obesity, OSA, s/p mechanical AVR, and persistent afib who presents for EP follow-up.  She reports initially being diagnosed with atrial fibrillation 4/14 after presenting to Dr [name] office with symptoms of fatigue and decreased exercise tolerance.  She was noted to be in afib with RVR at that time.  She tried beta blockers but did not tolerate this due to "a reaction".  She has since been rate controlled with diltiazem.  She was cardioverted 4/14 but returned to afib the next day.  She had persistent afib and therefore was placed on tikosyn.  She was again cardioverted but immediately returned to afib.  She has been in afib ever since.  She has also failed medical therapy with amiodarone. Today, she denies symptoms of chest pain, orthopnea, PND, dizziness, presyncope, syncope, or neurologic sequela. She has occasional leg swelling.   The patient is tolerating medications without difficulties and is otherwise without complaint today.   Past Medical History  Diagnosis Date  . Obesity   . Hypertension   . Aortic stenosis     status post aortic valve replacement with St. Jude mechanical prosthesis  . Subdural hematoma     in setting of INR greater than 2.2 shortly after AVR - now cleared by neurosurgery to maintain INR 2-2.5  . Dyslipidemia   . Anxiety   . Exertional shortness of breath   . Arthritis     "right little finger" (11/12/2012)  . Depression   . 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 Sleep apnea     "BiPAP" (11/12/2012)  . GERD (gastroesophageal reflux disease)     "several years ago; none since" (11/12/2012)  . Chronic anticoagulation   . Hyperlipidemia   . Persistent atrial fibrillation     4/14 TEE cardioversion success but return to Atrial fibrillation on 07/29/12 then S/P TEE/DCCV after Tikosyn load and now  back in atrial fibrillation   Past Surgical History  Procedure Laterality Date  . Burr hole for subdural hematoma  2006  . Knee arthroscopy Left 1980's    "2" (11/12/2012)  . Tee without cardioversion N/A 07/22/2012    Procedure: TRANSESOPHAGEAL ECHOCARDIOGRAM (TEE);  Surgeon: [name], MD;  Location: [facility] ENDOSCOPY;  Service: Cardiovascular;  Laterality: N/A;  Rm 2034  . Cardioversion N/A 07/22/2012    Procedure: CARDIOVERSION;  Surgeon: [name], MD;  Location: [facility] ENDOSCOPY;  Service: Cardiovascular;  Laterality: N/A;  . Knee surgery Left 1980's    "after 2 scopes they went in and did some kind of OR" (11/12/2012)  . Tubal ligation  1980's  . Cardiac valve replacement  2006    St. Jude AVR  . Cardioversion N/A 11/25/2012    Procedure: CARDIOVERSION;  Surgeon: [name], MD;  Location: [place];  Service: Cardiovascular;  Laterality: N/A;  . Cardioversion N/A 12/30/2012    Procedure: CARDIOVERSION;  Surgeon: [name], MD;  Location: [facility] ENDOSCOPY;  Service: Cardiovascular;  Laterality: N/A;  h&p in file-HW   . Cardioversion N/A 03/30/2013    Procedure: CARDIOVERSION;  Surgeon: [name], MD;  Location: [place] ENDOSCOPY;  Service: Cardiovascular;  Laterality: N/A;    Current Outpatient Prescriptions  Medication Sig Dispense Refill  . amiodarone (PACERONE) 200 MG tablet Take 1 tablet (200 mg total) by mouth daily.  90 tablet  3  .  calcium-vitamin D (OSCAL WITH D) 500-200 MG-UNIT per tablet Take 1 tablet by mouth daily.      . cholecalciferol (VITAMIN D) 1000 UNITS tablet Take 1,000 Units by mouth daily.      Marland Kitchen diltiazem (TIAZAC) 360 MG 24 hr capsule Take 1 capsule (360 mg total) by mouth daily.  30 capsule  5  . furosemide (LASIX) 80 MG tablet Take 1 tablet (80 mg total) by mouth daily.  30 tablet  12  . HYDROcodone-acetaminophen (NORCO/VICODIN) 5-325 MG per tablet Take 1 tablet by mouth every 4 (four) hours as needed.       Marland Kitchen losartan (COZAAR) 50 MG tablet TAKE 1 TABLET BY  MOUTH DAILY  90 tablet  1  . Multiple Vitamin (MULTIVITAMIN WITH MINERALS) TABS Take 1 tablet by mouth daily.      . potassium chloride SA (K-DUR,KLOR-CON) 20 MEQ tablet Take 20 mEq by mouth 2 (two) times daily.       . sertraline (ZOLOFT) 100 MG tablet Take 150 mg by mouth at bedtime.      . tretinoin (RETIN-A) 0.05 % cream Apply 1 Applicatorful topically at bedtime.      Marland Kitchen warfarin (COUMADIN) 1 MG tablet Take 1 mg warfarin tablets along with 4 mg tablets as directed by coumadin clinic  90 tablet  0  . warfarin (COUMADIN) 2 MG tablet Take 2 mg by mouth daily. ONLY ON Wednesday 4 MG ON ALL OTHER DAYS      . zolpidem (AMBIEN) 5 MG tablet Take 5 mg by mouth at bedtime as needed for sleep.       No current facility-administered medications for this visit.    Allergies  Allergen Reactions  . Codeine Anaphylaxis    jerking  . Beta Adrenergic Blockers     Makes her crazy  . Toprol Xl [Metoprolol Tartrate]     depression  . Dilantin [Phenytoin Sodium Extended] Rash    History   Social History  . Marital Status: Married    Spouse Name: N/A    Number of Children: N/A  . Years of Education: N/A   Occupational History  . Not on file.   Social History Main Topics  . Smoking status: Never Smoker   . Smokeless tobacco: Never Used  . Alcohol Use: Yes     Comment: 11/12/2012 "no alcohol in the last 9 years"  . Drug Use: No  . Sexual Activity: Not Currently   Other Topics Concern  . Not on file   Social History Narrative   Pt lives in [place] with spouse.  Retired    Family History  Problem Relation Age of Onset  . Lung cancer Mother   . Hypertension Mother   . Hyperlipidemia Father   . Hypertension Father   . Heart disease Brother     ROS- All systems are reviewed and negative except as per the HPI above  Physical Exam: Filed Vitals:   08/14/13 1036  BP: 143/83  Pulse: 102  Height: 5\' 7"  (1.702 m)  Weight: 246 lb (111.585 kg)    GEN- The patient is overweight  appearing, alert and oriented x 3 today.   Head- normocephalic, atraumatic Eyes-  Sclera clear, conjunctiva pink Ears- hearing intact Oropharynx- clear Neck- supple, no JVP Lymph- no cervical lymphadenopathy Lungs- Clear to ausculation bilaterally, normal work of breathing Heart- irregular rate and rhythm, mechanical S2 GI- soft, NT, ND, + BS Extremities- no clubbing, cyanosis, trace edema MS- no significant deformity or atrophy Skin- no  rash or lesion Psych- euthymic mood, full affect Neuro- strength and sensation are intact  EKG today reveals afib, V rate 102 bpm, nonspecific St/T changes Tee 4/14 is reviewed Echo 10/14 is reviewed, EF 50%, LA 60mm  Assessment and Plan:  1. Persistent afib The patient has symptomatic persistent afib.  She has failed medical therapy with beta blockers, calcium channel blockers, tikosyn, and amioadrone.  Therapeutic strategies for afib including medicine and ablation were discussed in detail with the patient today. Risk, benefits, and alternatives to EP study and radiofrequency ablation for afib were also discussed in detail today. These risks include but are not limited to stroke, bleeding, vascular damage, tamponade, perforation, damage to the esophagus, lungs, and other structures, pulmonary vein stenosis, worsening renal function, and death. The patient understands these risk and wishes to proceed.      2. HTN Stable No change required today  3. OSA Compliance with CPAP is encouraged  4. Obesity Weight loss advised

## 2013-08-17 ENCOUNTER — Ambulatory Visit (INDEPENDENT_AMBULATORY_CARE_PROVIDER_SITE_OTHER): Payer: Medicare Other | Admitting: Pharmacist

## 2013-08-17 DIAGNOSIS — I359 Nonrheumatic aortic valve disorder, unspecified: Secondary | ICD-10-CM

## 2013-08-17 DIAGNOSIS — Z5181 Encounter for therapeutic drug level monitoring: Secondary | ICD-10-CM | POA: Diagnosis not present

## 2013-08-17 DIAGNOSIS — I4891 Unspecified atrial fibrillation: Secondary | ICD-10-CM

## 2013-08-17 DIAGNOSIS — Z954 Presence of other heart-valve replacement: Secondary | ICD-10-CM

## 2013-08-17 LAB — POCT INR: INR: 2.2

## 2013-08-18 ENCOUNTER — Other Ambulatory Visit (HOSPITAL_COMMUNITY): Payer: Self-pay | Admitting: Cardiology

## 2013-08-24 ENCOUNTER — Ambulatory Visit (INDEPENDENT_AMBULATORY_CARE_PROVIDER_SITE_OTHER): Payer: Medicare Other | Admitting: Pharmacist

## 2013-08-24 DIAGNOSIS — Z954 Presence of other heart-valve replacement: Secondary | ICD-10-CM

## 2013-08-24 DIAGNOSIS — Z5181 Encounter for therapeutic drug level monitoring: Secondary | ICD-10-CM

## 2013-08-24 DIAGNOSIS — I359 Nonrheumatic aortic valve disorder, unspecified: Secondary | ICD-10-CM

## 2013-08-24 DIAGNOSIS — I4891 Unspecified atrial fibrillation: Secondary | ICD-10-CM | POA: Diagnosis not present

## 2013-08-24 LAB — POCT INR: INR: 2.5

## 2013-09-02 ENCOUNTER — Other Ambulatory Visit: Payer: Self-pay | Admitting: Cardiology

## 2013-09-04 ENCOUNTER — Ambulatory Visit (INDEPENDENT_AMBULATORY_CARE_PROVIDER_SITE_OTHER): Payer: Medicare Other | Admitting: Pharmacist

## 2013-09-04 DIAGNOSIS — Z5181 Encounter for therapeutic drug level monitoring: Secondary | ICD-10-CM | POA: Diagnosis not present

## 2013-09-04 DIAGNOSIS — I4891 Unspecified atrial fibrillation: Secondary | ICD-10-CM | POA: Diagnosis not present

## 2013-09-04 DIAGNOSIS — I359 Nonrheumatic aortic valve disorder, unspecified: Secondary | ICD-10-CM | POA: Diagnosis not present

## 2013-09-04 DIAGNOSIS — Z954 Presence of other heart-valve replacement: Secondary | ICD-10-CM | POA: Diagnosis not present

## 2013-09-04 LAB — POCT INR: INR: 2.1

## 2013-09-11 ENCOUNTER — Other Ambulatory Visit: Payer: Self-pay | Admitting: Cardiology

## 2013-09-11 ENCOUNTER — Ambulatory Visit (INDEPENDENT_AMBULATORY_CARE_PROVIDER_SITE_OTHER): Payer: Medicare Other | Admitting: Pharmacist

## 2013-09-11 DIAGNOSIS — I4891 Unspecified atrial fibrillation: Secondary | ICD-10-CM

## 2013-09-11 DIAGNOSIS — I359 Nonrheumatic aortic valve disorder, unspecified: Secondary | ICD-10-CM

## 2013-09-11 DIAGNOSIS — Z5181 Encounter for therapeutic drug level monitoring: Secondary | ICD-10-CM | POA: Diagnosis not present

## 2013-09-11 DIAGNOSIS — Z954 Presence of other heart-valve replacement: Secondary | ICD-10-CM | POA: Diagnosis not present

## 2013-09-11 LAB — POCT INR: INR: 2.9

## 2013-09-14 DIAGNOSIS — F329 Major depressive disorder, single episode, unspecified: Secondary | ICD-10-CM | POA: Diagnosis not present

## 2013-09-14 DIAGNOSIS — I10 Essential (primary) hypertension: Secondary | ICD-10-CM | POA: Diagnosis not present

## 2013-09-14 DIAGNOSIS — Z0000 Encounter for general adult medical examination without abnormal findings: Secondary | ICD-10-CM | POA: Diagnosis not present

## 2013-09-14 DIAGNOSIS — F3289 Other specified depressive episodes: Secondary | ICD-10-CM | POA: Diagnosis not present

## 2013-09-14 DIAGNOSIS — Z23 Encounter for immunization: Secondary | ICD-10-CM | POA: Diagnosis not present

## 2013-09-14 DIAGNOSIS — G4733 Obstructive sleep apnea (adult) (pediatric): Secondary | ICD-10-CM | POA: Diagnosis not present

## 2013-09-14 DIAGNOSIS — E7800 Pure hypercholesterolemia, unspecified: Secondary | ICD-10-CM | POA: Diagnosis not present

## 2013-09-14 DIAGNOSIS — I5032 Chronic diastolic (congestive) heart failure: Secondary | ICD-10-CM | POA: Diagnosis not present

## 2013-09-14 DIAGNOSIS — I4891 Unspecified atrial fibrillation: Secondary | ICD-10-CM | POA: Diagnosis not present

## 2013-09-14 DIAGNOSIS — I6529 Occlusion and stenosis of unspecified carotid artery: Secondary | ICD-10-CM | POA: Diagnosis not present

## 2013-09-15 ENCOUNTER — Other Ambulatory Visit: Payer: Self-pay | Admitting: Cardiology

## 2013-09-16 ENCOUNTER — Other Ambulatory Visit (HOSPITAL_COMMUNITY): Payer: Self-pay | Admitting: Cardiology

## 2013-09-18 ENCOUNTER — Ambulatory Visit (INDEPENDENT_AMBULATORY_CARE_PROVIDER_SITE_OTHER): Payer: Medicare Other

## 2013-09-18 DIAGNOSIS — I4891 Unspecified atrial fibrillation: Secondary | ICD-10-CM | POA: Diagnosis not present

## 2013-09-18 DIAGNOSIS — Z954 Presence of other heart-valve replacement: Secondary | ICD-10-CM

## 2013-09-18 DIAGNOSIS — Z5181 Encounter for therapeutic drug level monitoring: Secondary | ICD-10-CM

## 2013-09-18 DIAGNOSIS — I359 Nonrheumatic aortic valve disorder, unspecified: Secondary | ICD-10-CM

## 2013-09-18 LAB — POCT INR: INR: 2.9

## 2013-09-25 ENCOUNTER — Ambulatory Visit (INDEPENDENT_AMBULATORY_CARE_PROVIDER_SITE_OTHER): Payer: Medicare Other | Admitting: Pharmacist

## 2013-09-25 DIAGNOSIS — Z954 Presence of other heart-valve replacement: Secondary | ICD-10-CM

## 2013-09-25 DIAGNOSIS — I4891 Unspecified atrial fibrillation: Secondary | ICD-10-CM

## 2013-09-25 DIAGNOSIS — Z5181 Encounter for therapeutic drug level monitoring: Secondary | ICD-10-CM

## 2013-09-25 DIAGNOSIS — I359 Nonrheumatic aortic valve disorder, unspecified: Secondary | ICD-10-CM | POA: Diagnosis not present

## 2013-09-25 LAB — POCT INR: INR: 2.7

## 2013-10-02 ENCOUNTER — Ambulatory Visit (INDEPENDENT_AMBULATORY_CARE_PROVIDER_SITE_OTHER): Payer: Medicare Other | Admitting: Pharmacist

## 2013-10-02 ENCOUNTER — Other Ambulatory Visit (INDEPENDENT_AMBULATORY_CARE_PROVIDER_SITE_OTHER): Payer: Medicare Other

## 2013-10-02 ENCOUNTER — Encounter (HOSPITAL_COMMUNITY): Payer: Self-pay

## 2013-10-02 DIAGNOSIS — Z954 Presence of other heart-valve replacement: Secondary | ICD-10-CM | POA: Diagnosis not present

## 2013-10-02 DIAGNOSIS — I359 Nonrheumatic aortic valve disorder, unspecified: Secondary | ICD-10-CM

## 2013-10-02 DIAGNOSIS — Z01812 Encounter for preprocedural laboratory examination: Secondary | ICD-10-CM | POA: Diagnosis not present

## 2013-10-02 DIAGNOSIS — I4891 Unspecified atrial fibrillation: Secondary | ICD-10-CM | POA: Diagnosis not present

## 2013-10-02 DIAGNOSIS — Z5181 Encounter for therapeutic drug level monitoring: Secondary | ICD-10-CM

## 2013-10-02 LAB — POCT INR: INR: 2.4

## 2013-10-02 LAB — CBC WITH DIFFERENTIAL/PLATELET
BASOS ABS: 0 10*3/uL (ref 0.0–0.1)
Basophils Relative: 0.5 % (ref 0.0–3.0)
EOS ABS: 0.2 10*3/uL (ref 0.0–0.7)
Eosinophils Relative: 3.4 % (ref 0.0–5.0)
HCT: 37.3 % (ref 36.0–46.0)
Hemoglobin: 12.5 g/dL (ref 12.0–15.0)
LYMPHS PCT: 18.7 % (ref 12.0–46.0)
Lymphs Abs: 1 10*3/uL (ref 0.7–4.0)
MCHC: 33.6 g/dL (ref 30.0–36.0)
MCV: 91.1 fl (ref 78.0–100.0)
MONOS PCT: 11.8 % (ref 3.0–12.0)
Monocytes Absolute: 0.7 10*3/uL (ref 0.1–1.0)
NEUTROS PCT: 65.6 % (ref 43.0–77.0)
Neutro Abs: 3.7 10*3/uL (ref 1.4–7.7)
PLATELETS: 247 10*3/uL (ref 150.0–400.0)
RBC: 4.09 Mil/uL (ref 3.87–5.11)
RDW: 14.9 % (ref 11.5–15.5)
WBC: 5.6 10*3/uL (ref 4.0–10.5)

## 2013-10-02 LAB — BASIC METABOLIC PANEL
BUN: 17 mg/dL (ref 6–23)
CALCIUM: 9.9 mg/dL (ref 8.4–10.5)
CO2: 31 meq/L (ref 19–32)
CREATININE: 0.9 mg/dL (ref 0.4–1.2)
Chloride: 103 mEq/L (ref 96–112)
GFR: 70.63 mL/min (ref >60.00)
GLUCOSE: 102 mg/dL — AB (ref 70–99)
Potassium: 5 mEq/L (ref 3.5–5.1)
Sodium: 143 mEq/L (ref 135–145)

## 2013-10-02 LAB — PROTIME-INR
INR: 2.4 ratio — AB (ref 0.8–1.0)
Prothrombin Time: 25.8 s — ABNORMAL HIGH (ref 9.6–13.1)

## 2013-10-06 ENCOUNTER — Ambulatory Visit (INDEPENDENT_AMBULATORY_CARE_PROVIDER_SITE_OTHER): Payer: Medicare Other | Admitting: *Deleted

## 2013-10-06 DIAGNOSIS — I4891 Unspecified atrial fibrillation: Secondary | ICD-10-CM

## 2013-10-06 DIAGNOSIS — I359 Nonrheumatic aortic valve disorder, unspecified: Secondary | ICD-10-CM | POA: Diagnosis not present

## 2013-10-06 DIAGNOSIS — Z954 Presence of other heart-valve replacement: Secondary | ICD-10-CM

## 2013-10-06 DIAGNOSIS — Z5181 Encounter for therapeutic drug level monitoring: Secondary | ICD-10-CM

## 2013-10-06 LAB — POCT INR: INR: 2.4

## 2013-10-07 ENCOUNTER — Encounter (HOSPITAL_COMMUNITY)
Admission: RE | Disposition: A | Discharge status: Home / Self Care | Payer: Self-pay | Source: Ambulatory Visit | Attending: Cardiology

## 2013-10-07 ENCOUNTER — Ambulatory Visit (HOSPITAL_COMMUNITY)
Admission: RE | Admit: 2013-10-07 | Discharge: 2013-10-07 | Disposition: A | Discharge status: Home / Self Care | Payer: Medicare Other | Source: Ambulatory Visit | Attending: Cardiology | Admitting: Cardiology

## 2013-10-07 DIAGNOSIS — G4733 Obstructive sleep apnea (adult) (pediatric): Secondary | ICD-10-CM | POA: Insufficient documentation

## 2013-10-07 DIAGNOSIS — E785 Hyperlipidemia, unspecified: Secondary | ICD-10-CM | POA: Diagnosis not present

## 2013-10-07 DIAGNOSIS — I059 Rheumatic mitral valve disease, unspecified: Secondary | ICD-10-CM | POA: Diagnosis not present

## 2013-10-07 DIAGNOSIS — Z79899 Other long term (current) drug therapy: Secondary | ICD-10-CM | POA: Diagnosis not present

## 2013-10-07 DIAGNOSIS — Z954 Presence of other heart-valve replacement: Secondary | ICD-10-CM | POA: Diagnosis not present

## 2013-10-07 DIAGNOSIS — M19049 Primary osteoarthritis, unspecified hand: Secondary | ICD-10-CM | POA: Insufficient documentation

## 2013-10-07 DIAGNOSIS — E669 Obesity, unspecified: Secondary | ICD-10-CM | POA: Diagnosis not present

## 2013-10-07 DIAGNOSIS — F3289 Other specified depressive episodes: Secondary | ICD-10-CM | POA: Diagnosis not present

## 2013-10-07 DIAGNOSIS — I4891 Unspecified atrial fibrillation: Secondary | ICD-10-CM | POA: Diagnosis not present

## 2013-10-07 DIAGNOSIS — I10 Essential (primary) hypertension: Secondary | ICD-10-CM | POA: Diagnosis not present

## 2013-10-07 DIAGNOSIS — F329 Major depressive disorder, single episode, unspecified: Secondary | ICD-10-CM | POA: Diagnosis not present

## 2013-10-07 DIAGNOSIS — Z7901 Long term (current) use of anticoagulants: Secondary | ICD-10-CM | POA: Insufficient documentation

## 2013-10-07 DIAGNOSIS — K219 Gastro-esophageal reflux disease without esophagitis: Secondary | ICD-10-CM | POA: Insufficient documentation

## 2013-10-07 DIAGNOSIS — I079 Rheumatic tricuspid valve disease, unspecified: Secondary | ICD-10-CM | POA: Diagnosis not present

## 2013-10-07 DIAGNOSIS — F411 Generalized anxiety disorder: Secondary | ICD-10-CM | POA: Diagnosis not present

## 2013-10-07 DIAGNOSIS — I480 Paroxysmal atrial fibrillation: Secondary | ICD-10-CM | POA: Diagnosis present

## 2013-10-07 HISTORY — PX: TEE WITHOUT CARDIOVERSION: SHX5443

## 2013-10-07 SURGERY — ECHOCARDIOGRAM, TRANSESOPHAGEAL
Anesthesia: Moderate Sedation

## 2013-10-07 MED ORDER — FENTANYL CITRATE 0.05 MG/ML IJ SOLN
INTRAMUSCULAR | Status: AC
  Filled 2013-10-07: qty 2

## 2013-10-07 MED ORDER — FENTANYL CITRATE 0.05 MG/ML IJ SOLN
INTRAMUSCULAR | Status: DC | PRN
  Administered 2013-10-07 (×2): 25 ug via INTRAVENOUS

## 2013-10-07 MED ORDER — BUTAMBEN-TETRACAINE-BENZOCAINE 2-2-14 % EX AERO
INHALATION_SPRAY | CUTANEOUS | Status: DC | PRN
  Administered 2013-10-07: 2 via TOPICAL

## 2013-10-07 MED ORDER — MIDAZOLAM HCL 5 MG/ML IJ SOLN
INTRAMUSCULAR | Status: AC
  Filled 2013-10-07: qty 2

## 2013-10-07 MED ORDER — SODIUM CHLORIDE 0.9 % IV SOLN: INTRAVENOUS | Status: DC

## 2013-10-07 MED ORDER — MIDAZOLAM HCL 10 MG/2ML IJ SOLN
INTRAMUSCULAR | Status: DC | PRN
  Administered 2013-10-07: 1 mg via INTRAVENOUS
  Administered 2013-10-07 (×2): 2 mg via INTRAVENOUS

## 2013-10-07 NOTE — CV Procedure (Signed)
TEE  PRE AFIB ABLATION  No thrombus Normal EF Dilated LA (5.8cm) Moderate MR/TR Dilated RA  Discussed with patient.   [name], MD

## 2013-10-07 NOTE — Progress Notes (Signed)
  Echocardiogram Echocardiogram Transesophageal has been performed.  Donata Clay 10/07/2013, 9:11 AM

## 2013-10-07 NOTE — Progress Notes (Deleted)
*  PRELIMINARY RESULTS* Echocardiogram Echocardiogram Transesophageal has been performed.  Donata Clay 10/07/2013, 9:10 AM

## 2013-10-07 NOTE — H&P (Signed)
HERE TODAY FOR TEE PRIOR TO ABLATION FOR ATRIAL FIBRILLATION.  Note from prior Dr. Rayann [name] visit.  Primary Care Physician: Meredith Shouts, MD  Referring Physician: Dr [name] is a 68 y.o. female with a h/o obesity, OSA, s/p mechanical AVR, and persistent afib who presents for EP consultation. She reports initially being diagnosed with atrial fibrillation 4/14 after presenting to Dr [name] office with symptoms of fatigue and decreased exercise tolerance. She was noted to be in afib with RVR at that time. She tried beta blockers but did not tolerate this due to "a reaction". She has since been rate controlled with diltiazem. She was cardioverted 4/14 but returned to afib the next day. She was therefore placed on tikosyn. She had persistent afib and therefore was placed on tikosyn. She was again cardioverted but immediately returned to afib. She has been in afib ever since. She reports symptoms of fatigue and decreased exercise tolerance but is unaware of palpitations. She was recently seen by me and initiated on amiodarone. Unfortunately she remains in afib. She is tolerating amiodarone without difficulty.  Today, she denies symptoms of chest pain, orthopnea, PND, dizziness, presyncope, syncope, or neurologic sequela. She has occasional leg swelling. The patient is tolerating medications without difficulties and is otherwise without complaint today.  Past Medical History   Diagnosis  Date   .  Obesity    .  Hypertension    .  Aortic stenosis      status post aortic valve replacement with St. Jude mechanical prosthesis   .  Subdural hematoma      in setting of INR greater than 2.2 shortly after AVR - now cleared by neurosurgery to maintain INR 2-2.5   .  Dyslipidemia    .  Anxiety    .  Exertional shortness of breath    .  Arthritis      "right little finger" (11/12/2012)   .  Depression    .  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  Sleep apnea      "BiPAP" (11/12/2012)   .  GERD (gastroesophageal reflux disease)      "several years ago; none since" (11/12/2012)   .  Chronic anticoagulation    .  Hyperlipidemia    .  Persistent atrial fibrillation      4/14 TEE cardioversion success but return to Atrial fibrillation on 07/29/12 then S/P TEE/DCCV after Tikosyn load and now back in atrial fibrillation    Past Surgical History   Procedure  Laterality  Date   .  Burr hole for subdural hematoma   2006   .  Knee arthroscopy  Left  1980's     "2" (11/12/2012)   .  Tee without cardioversion  N/A  07/22/2012     Procedure: TRANSESOPHAGEAL ECHOCARDIOGRAM (TEE); Surgeon: Meredith Furbish, MD; Location: [facility] ENDOSCOPY; Service: Cardiovascular; Laterality: N/A; Rm 2034   .  Cardioversion  N/A  07/22/2012     Procedure: CARDIOVERSION; Surgeon: Meredith Furbish, MD; Location: [facility] ENDOSCOPY; Service: Cardiovascular; Laterality: N/A;   .  Knee surgery  Left  1980's     "after 2 scopes they went in and did some kind of OR" (11/12/2012)   .  Tubal ligation   1980's   .  Cardiac valve replacement   2006     St. Jude AVR   .  Cardioversion  N/A  11/25/2012  Procedure: CARDIOVERSION; Surgeon: Meredith Margarita, MD; Location: [place]; Service: Cardiovascular; Laterality: N/A;   .  Cardioversion  N/A  12/30/2012     Procedure: CARDIOVERSION; Surgeon: Meredith Margarita, MD; Location: [facility] ENDOSCOPY; Service: Cardiovascular; Laterality: N/A; h&p in file-HW   .  Cardioversion  N/A  03/30/2013     Procedure: CARDIOVERSION; Surgeon: Meredith Margarita, MD; Location: [place] ENDOSCOPY; Service: Cardiovascular; Laterality: N/A;    Current Outpatient Prescriptions   Medication  Sig  Dispense  Refill   .  amiodarone (PACERONE) 200 MG tablet  Take 1 tablet (200 mg total) by mouth daily.  90 tablet  3   .  calcium-vitamin D (OSCAL WITH D) 500-200 MG-UNIT per tablet  Take 1 tablet by mouth daily.     .  cholecalciferol (VITAMIN D) 1000 UNITS tablet  Take 1,000 Units by mouth  daily.     Marland Kitchen  diltiazem (TIAZAC) 360 MG 24 hr capsule  Take 1 capsule (360 mg total) by mouth daily.  30 capsule  5   .  furosemide (LASIX) 80 MG tablet  Take 1 tablet (80 mg total) by mouth daily.  30 tablet  12   .  HYDROcodone-acetaminophen (NORCO/VICODIN) 5-325 MG per tablet  Take 1 tablet by mouth every 4 (four) hours as needed.     Marland Kitchen  losartan (COZAAR) 50 MG tablet  TAKE 1 TABLET BY MOUTH DAILY  90 tablet  0   .  Multiple Vitamin (MULTIVITAMIN WITH MINERALS) TABS  Take 1 tablet by mouth daily.     .  potassium chloride SA (K-DUR,KLOR-CON) 20 MEQ tablet  Take 60 mEq by mouth every morning.     .  sertraline (ZOLOFT) 100 MG tablet  Take 150 mg by mouth at bedtime.     .  tretinoin (RETIN-A) 0.05 % cream  Apply 1 Applicatorful topically at bedtime.     Marland Kitchen  warfarin (COUMADIN) 1 MG tablet  Take 1 mg warfarin tablets along with 4 mg tablets as directed by coumadin clinic  90 tablet  0   .  warfarin (COUMADIN) 4 MG tablet  Take 1 tablet on all days of the week or as directed by Coumadin Clinic.  105 tablet  1   .  zolpidem (AMBIEN) 5 MG tablet  Take 5 mg by mouth at bedtime as needed for sleep.      No current facility-administered medications for this visit.    Allergies   Allergen  Reactions   .  Codeine  Anaphylaxis     jerking   .  Beta Adrenergic Blockers      Makes her crazy   .  Toprol Xl [Metoprolol Tartrate]      depression   .  Dilantin [Phenytoin Sodium Extended]  Rash    History    Social History   .  Marital Status:  Married     Spouse Name:  N/A     Number of Children:  N/A   .  Years of Education:  N/A    Occupational History   .  Not on file.    Social History Main Topics   .  Smoking status:  Never Smoker   .  Smokeless tobacco:  Never Used   .  Alcohol Use:  Yes      Comment: 11/12/2012 "no alcohol in the last 9 years"   .  Drug Use:  No   .  Sexual Activity:  Not Currently    Other  Topics  Concern   .  Not on file    Social History Narrative    Pt lives  in [place] with spouse. Retired    Family History   Problem  Relation  Age of Onset   .  Lung cancer  Mother    .  Hypertension  Mother    .  Hyperlipidemia  Father    .  Hypertension  Father    .  Heart disease  Brother    ROS- All systems are reviewed and negative except as per the HPI above  Physical Exam:  Filed Vitals:    05/14/13 1046   BP:  124/78   Pulse:  79   Height:  5\' 7"  (1.702 m)   Weight:  245 lb 6.4 oz (111.313 kg)   GEN- The patient is overweight appearing, alert and oriented x 3 today.  Head- normocephalic, atraumatic  Eyes- Sclera clear, conjunctiva pink  Ears- hearing intact  Oropharynx- clear  Neck- supple, no JVP  Lymph- no cervical lymphadenopathy  Lungs- Clear to ausculation bilaterally, normal work of breathing  Heart- irregular rate and rhythm, mechanical S2  GI- soft, NT, ND, + BS  Extremities- no clubbing, cyanosis, trace edema  MS- no significant deformity or atrophy  Skin- no rash or lesion  Psych- euthymic mood, full affect  Neuro- strength and sensation are intact  EKG today reveals afib, V rate 79 bpm, nonspecific St/T changes  Darden Dates 4/14 is reviewed  Dr [name] records are reviewed  Assessment and Plan:  1. Persistent afib  The patient has symptomatic persistent afib. She has failed medical therapy with beta blockers, calcium channel blockers, and tikosyn. She remains in afib despite amiodarone.  Therapeutic strategies for afib including medicine and ablation were discussed in detail with the patient today. Risk, benefits, and alternatives to EP study and radiofrequency ablation for afib were also discussed in detail today. She is now more willing to consider ablation. She will contemplate this further and contact our office if she decides to proceed.  2. HTN  Stable  No change required today  3. OSA Compliance with CPAP is encouraged  4. Obesity  Weight loss advised   Risks and benefits of TEE were discussed including bleeding,  damage to esophagus. She had this done in 4/14. Willing to proceed.   Meredith Furbish, MD

## 2013-10-08 ENCOUNTER — Encounter (HOSPITAL_COMMUNITY): Payer: Self-pay | Admitting: Certified Registered"

## 2013-10-08 ENCOUNTER — Encounter (HOSPITAL_COMMUNITY)
Admission: RE | Disposition: A | Discharge status: Home / Self Care | Payer: Self-pay | Source: Ambulatory Visit | Attending: Internal Medicine

## 2013-10-08 ENCOUNTER — Ambulatory Visit (HOSPITAL_COMMUNITY)
Admission: RE | Admit: 2013-10-08 | Discharge: 2013-10-08 | Disposition: A | Discharge status: Home / Self Care | Payer: Medicare Other | Source: Ambulatory Visit | Attending: Internal Medicine | Admitting: Internal Medicine

## 2013-10-08 DIAGNOSIS — F430 Acute stress reaction: Secondary | ICD-10-CM | POA: Insufficient documentation

## 2013-10-08 DIAGNOSIS — Z954 Presence of other heart-valve replacement: Secondary | ICD-10-CM | POA: Diagnosis not present

## 2013-10-08 DIAGNOSIS — F411 Generalized anxiety disorder: Secondary | ICD-10-CM | POA: Diagnosis not present

## 2013-10-08 DIAGNOSIS — Z7901 Long term (current) use of anticoagulants: Secondary | ICD-10-CM | POA: Diagnosis not present

## 2013-10-08 DIAGNOSIS — I059 Rheumatic mitral valve disease, unspecified: Secondary | ICD-10-CM | POA: Diagnosis not present

## 2013-10-08 DIAGNOSIS — E669 Obesity, unspecified: Secondary | ICD-10-CM | POA: Diagnosis not present

## 2013-10-08 DIAGNOSIS — E785 Hyperlipidemia, unspecified: Secondary | ICD-10-CM | POA: Diagnosis not present

## 2013-10-08 DIAGNOSIS — I4891 Unspecified atrial fibrillation: Secondary | ICD-10-CM | POA: Diagnosis not present

## 2013-10-08 DIAGNOSIS — F3289 Other specified depressive episodes: Secondary | ICD-10-CM | POA: Diagnosis not present

## 2013-10-08 DIAGNOSIS — Z79899 Other long term (current) drug therapy: Secondary | ICD-10-CM | POA: Diagnosis not present

## 2013-10-08 DIAGNOSIS — M19049 Primary osteoarthritis, unspecified hand: Secondary | ICD-10-CM | POA: Insufficient documentation

## 2013-10-08 DIAGNOSIS — G4730 Sleep apnea, unspecified: Secondary | ICD-10-CM | POA: Diagnosis not present

## 2013-10-08 DIAGNOSIS — I10 Essential (primary) hypertension: Secondary | ICD-10-CM | POA: Insufficient documentation

## 2013-10-08 DIAGNOSIS — Z5309 Procedure and treatment not carried out because of other contraindication: Secondary | ICD-10-CM | POA: Insufficient documentation

## 2013-10-08 DIAGNOSIS — R0602 Shortness of breath: Secondary | ICD-10-CM | POA: Diagnosis not present

## 2013-10-08 DIAGNOSIS — F329 Major depressive disorder, single episode, unspecified: Secondary | ICD-10-CM | POA: Diagnosis not present

## 2013-10-08 LAB — PROTIME-INR
INR: 2.52 — AB (ref 0.00–1.49)
Prothrombin Time: 27.2 seconds — ABNORMAL HIGH (ref 11.6–15.2)

## 2013-10-08 SURGERY — ATRIAL FIBRILLATION ABLATION
Anesthesia: Monitor Anesthesia Care

## 2013-10-08 MED ORDER — BUPIVACAINE HCL (PF) 0.25 % IJ SOLN
INTRAMUSCULAR | Status: AC
  Filled 2013-10-08: qty 30

## 2013-10-08 MED ORDER — HEPARIN SODIUM (PORCINE) 1000 UNIT/ML IJ SOLN
INTRAMUSCULAR | Status: AC
  Filled 2013-10-08: qty 1

## 2013-10-08 NOTE — H&P (Signed)
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 female with a h/o obesity, OSA, s/p mechanical AVR, and persistent afib who presents for EP follow-up. She reports initially being diagnosed with atrial fibrillation 4/14 after presenting to Dr [name] office with symptoms of fatigue and decreased exercise tolerance. She was noted to be in afib with RVR at that time. She tried beta blockers but did not tolerate this due to "a reaction". She has since been rate controlled with diltiazem. She was cardioverted 4/14 but returned to afib the next day. She had persistent afib and therefore was placed on tikosyn. She was again cardioverted but immediately returned to afib. She has been in afib ever since. She has also failed medical therapy with amiodarone.  She had TEE yesterday which revealed moderate MR and LA size 45mm  Past Medical History   Diagnosis  Date   .  Obesity    .  Hypertension    .  Aortic stenosis      status post aortic valve replacement with St. Jude mechanical prosthesis   .  Subdural hematoma      in setting of INR greater than 2.2 shortly after AVR - now cleared by neurosurgery to maintain INR 2-2.5   .  Dyslipidemia    .  Anxiety    .  Exertional shortness of breath    .  Arthritis      "right little finger" (11/12/2012)   .  Depression    .  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  Sleep apnea      "BiPAP" (11/12/2012)   .  GERD (gastroesophageal reflux disease)      "several years ago; none since" (11/12/2012)   .  Chronic anticoagulation    .  Hyperlipidemia    .  Persistent atrial fibrillation      4/14 TEE cardioversion success but return to Atrial fibrillation on 07/29/12 then S/P TEE/DCCV after Tikosyn load and now back in atrial fibrillation    Past Surgical History   Procedure  Laterality  Date   .  Burr hole for subdural hematoma   2006   .  Knee arthroscopy  Left   1980's     "2" (11/12/2012)   .  Tee without cardioversion  N/A  07/22/2012     Procedure: TRANSESOPHAGEAL ECHOCARDIOGRAM (TEE); Surgeon: [name], MD; Location: [facility] ENDOSCOPY; Service: Cardiovascular; Laterality: N/A; Rm 2034   .  Cardioversion  N/A  07/22/2012     Procedure: CARDIOVERSION; Surgeon: [name], MD; Location: [facility] ENDOSCOPY; Service: Cardiovascular; Laterality: N/A;   .  Knee surgery  Left  1980's     "after 2 scopes they went in and did some kind of OR" (11/12/2012)   .  Tubal ligation   1980's   .  Cardiac valve replacement   2006     St. Jude AVR   .  Cardioversion  N/A  11/25/2012     Procedure: CARDIOVERSION; Surgeon: [name], MD; Location: [place]; Service: Cardiovascular; Laterality: N/A;   .  Cardioversion  N/A  12/30/2012     Procedure: CARDIOVERSION; Surgeon: [name], MD; Location: [facility] ENDOSCOPY; Service: Cardiovascular; Laterality: N/A; h&p in file-HW   .  Cardioversion  N/A  03/30/2013     Procedure: CARDIOVERSION; Surgeon: [name], MD; Location: [place]; Service: Cardiovascular; Laterality: N/A;  Current Outpatient Prescriptions   Medication  Sig  Dispense  Refill   .  amiodarone (PACERONE) 200 MG tablet  Take 1 tablet (200 mg total) by mouth daily.  90 tablet  3   .  calcium-vitamin D (OSCAL WITH D) 500-200 MG-UNIT per tablet  Take 1 tablet by mouth daily.     .  cholecalciferol (VITAMIN D) 1000 UNITS tablet  Take 1,000 Units by mouth daily.     Marland Kitchen  diltiazem (TIAZAC) 360 MG 24 hr capsule  Take 1 capsule (360 mg total) by mouth daily.  30 capsule  5   .  furosemide (LASIX) 80 MG tablet  Take 1 tablet (80 mg total) by mouth daily.  30 tablet  12   .  HYDROcodone-acetaminophen (NORCO/VICODIN) 5-325 MG per tablet  Take 1 tablet by mouth every 4 (four) hours as needed.     Marland Kitchen  losartan (COZAAR) 50 MG tablet  TAKE 1 TABLET BY MOUTH DAILY  90 tablet  1   .  Multiple Vitamin (MULTIVITAMIN WITH MINERALS) TABS  Take 1 tablet by mouth daily.      .  potassium chloride SA (K-DUR,KLOR-CON) 20 MEQ tablet  Take 20 mEq by mouth 2 (two) times daily.     .  sertraline (ZOLOFT) 100 MG tablet  Take 150 mg by mouth at bedtime.     .  tretinoin (RETIN-A) 0.05 % cream  Apply 1 Applicatorful topically at bedtime.     Marland Kitchen  warfarin (COUMADIN) 1 MG tablet  Take 1 mg warfarin tablets along with 4 mg tablets as directed by coumadin clinic  90 tablet  0   .  warfarin (COUMADIN) 2 MG tablet  Take 2 mg by mouth daily. ONLY ON Wednesday 4 MG ON ALL OTHER DAYS     .  zolpidem (AMBIEN) 5 MG tablet  Take 5 mg by mouth at bedtime as needed for sleep.      No current facility-administered medications for this visit.    Allergies   Allergen  Reactions   .  Codeine  Anaphylaxis     jerking   .  Beta Adrenergic Blockers      Makes her crazy   .  Toprol Xl [Metoprolol Tartrate]      depression   .  Dilantin [Phenytoin Sodium Extended]  Rash    History    Social History   .  Marital Status:  Married     Spouse Name:  N/A     Number of Children:  N/A   .  Years of Education:  N/A    Occupational History   .  Not on file.    Social History Main Topics   .  Smoking status:  Never Smoker   .  Smokeless tobacco:  Never Used   .  Alcohol Use:  Yes      Comment: 11/12/2012 "no alcohol in the last 9 years"   .  Drug Use:  No   .  Sexual Activity:  Not Currently    Other Topics  Concern   .  Not on file    Social History Narrative    Pt lives in [place] with spouse. Retired    Family History   Problem  Relation  Age of Onset   .  Lung cancer  Mother    .  Hypertension  Mother    .  Hyperlipidemia  Father    .  Hypertension  Father    .  Heart disease  Brother    ROS- All systems are reviewed and negative except as per the HPI above  Physical Exam:  Filed Vitals:   10/08/13 0540  BP: 127/78  Pulse: 95  Temp: 98.2 F (36.8 C)  Resp: 18    GEN- The patient is overweight appearing, alert and oriented x 3 today.  Head- normocephalic,  atraumatic  Eyes- Sclera clear, conjunctiva pink  Ears- hearing intact  Oropharynx- clear  Extremities- no clubbing, cyanosis, trace edema   TEE 10/06/13- severe LA enlargement (LA 11mm) with moderate MR  Assessment and Plan:  1. Persistent afib  The patient has symptomatic persistent afib. She has failed medical therapy with beta blockers, calcium channel blockers, tikosyn, and amioadrone.  Given severe LA enlargement and LA size >55 mm, I think that anticipated success rates for PVI are quite low.  I had a long discussion with the patient and her family about this this am.  Ideally, we will refer for MAZE/ mitral valve repair however given her prior sternotomy I am not sure if she is a candidate for this.  I will review with Dr Roxy Manns and then consider surgical referral either for this or a hybrid procedure.

## 2013-10-08 NOTE — Anesthesia Preprocedure Evaluation (Deleted)
Anesthesia Evaluation  Patient identified by MRN, date of birth, ID band Patient awake    Reviewed: Allergy & Precautions, H&P , NPO status , Patient's Chart, lab work & pertinent test results  History of Anesthesia Complications Negative for: history of anesthetic complications  Airway Mallampati: II TM Distance: >3 FB Neck ROM: Full    Dental  (+) Teeth Intact   Pulmonary sleep apnea and Continuous Positive Airway Pressure Ventilation ,    Pulmonary exam normal       Cardiovascular hypertension, +CHF + Valvular Problems/Murmurs Rhythm:Irregular     Neuro/Psych PSYCHIATRIC DISORDERS Anxiety Depression negative neurological ROS     GI/Hepatic Neg liver ROS, GERD-  ,  Endo/Other  Morbid obesity  Renal/GU      Musculoskeletal   Abdominal   Peds  Hematology   Anesthesia Other Findings   Reproductive/Obstetrics                           Anesthesia Physical Anesthesia Plan  ASA: III  Anesthesia Plan: MAC   Post-op Pain Management:    Induction: Intravenous  Airway Management Planned:   Additional Equipment:   Intra-op Plan:   Post-operative Plan:   Informed Consent: I have reviewed the patients History and Physical, chart, labs and discussed the procedure including the risks, benefits and alternatives for the proposed anesthesia with the patient or authorized representative who has indicated his/her understanding and acceptance.   Dental advisory given  Plan Discussed with: CRNA, Anesthesiologist and Surgeon  Anesthesia Plan Comments:         Anesthesia Quick Evaluation

## 2013-10-12 ENCOUNTER — Other Ambulatory Visit: Payer: Self-pay | Admitting: *Deleted

## 2013-10-12 DIAGNOSIS — I4819 Other persistent atrial fibrillation: Secondary | ICD-10-CM

## 2013-10-15 ENCOUNTER — Ambulatory Visit (INDEPENDENT_AMBULATORY_CARE_PROVIDER_SITE_OTHER): Payer: Medicare Other | Admitting: Pharmacist

## 2013-10-15 DIAGNOSIS — Z954 Presence of other heart-valve replacement: Secondary | ICD-10-CM | POA: Diagnosis not present

## 2013-10-15 DIAGNOSIS — I4891 Unspecified atrial fibrillation: Secondary | ICD-10-CM

## 2013-10-15 DIAGNOSIS — I359 Nonrheumatic aortic valve disorder, unspecified: Secondary | ICD-10-CM | POA: Diagnosis not present

## 2013-10-15 DIAGNOSIS — Z5181 Encounter for therapeutic drug level monitoring: Secondary | ICD-10-CM | POA: Diagnosis not present

## 2013-10-15 LAB — POCT INR: INR: 2.5

## 2013-10-22 ENCOUNTER — Ambulatory Visit (INDEPENDENT_AMBULATORY_CARE_PROVIDER_SITE_OTHER): Payer: Medicare Other | Admitting: Pharmacist

## 2013-10-22 ENCOUNTER — Other Ambulatory Visit (HOSPITAL_COMMUNITY): Payer: Self-pay | Admitting: Cardiology

## 2013-10-22 DIAGNOSIS — Z5181 Encounter for therapeutic drug level monitoring: Secondary | ICD-10-CM

## 2013-10-22 DIAGNOSIS — I4891 Unspecified atrial fibrillation: Secondary | ICD-10-CM

## 2013-10-22 DIAGNOSIS — Z954 Presence of other heart-valve replacement: Secondary | ICD-10-CM | POA: Diagnosis not present

## 2013-10-22 DIAGNOSIS — I359 Nonrheumatic aortic valve disorder, unspecified: Secondary | ICD-10-CM | POA: Diagnosis not present

## 2013-10-22 LAB — POCT INR: INR: 2.3

## 2013-10-23 ENCOUNTER — Other Ambulatory Visit (HOSPITAL_COMMUNITY): Payer: Self-pay | Admitting: Cardiology

## 2013-10-23 DIAGNOSIS — I4891 Unspecified atrial fibrillation: Secondary | ICD-10-CM | POA: Diagnosis not present

## 2013-10-28 DIAGNOSIS — M949 Disorder of cartilage, unspecified: Secondary | ICD-10-CM | POA: Diagnosis not present

## 2013-10-28 DIAGNOSIS — M899 Disorder of bone, unspecified: Secondary | ICD-10-CM | POA: Diagnosis not present

## 2013-10-29 ENCOUNTER — Ambulatory Visit (INDEPENDENT_AMBULATORY_CARE_PROVIDER_SITE_OTHER): Payer: Medicare Other | Admitting: Pharmacist

## 2013-10-29 DIAGNOSIS — Z954 Presence of other heart-valve replacement: Secondary | ICD-10-CM

## 2013-10-29 DIAGNOSIS — Z5181 Encounter for therapeutic drug level monitoring: Secondary | ICD-10-CM

## 2013-10-29 DIAGNOSIS — I4891 Unspecified atrial fibrillation: Secondary | ICD-10-CM | POA: Diagnosis not present

## 2013-10-29 DIAGNOSIS — I359 Nonrheumatic aortic valve disorder, unspecified: Secondary | ICD-10-CM

## 2013-10-29 LAB — POCT INR: INR: 2.7

## 2013-11-03 ENCOUNTER — Ambulatory Visit (INDEPENDENT_AMBULATORY_CARE_PROVIDER_SITE_OTHER): Payer: Medicare Other | Admitting: *Deleted

## 2013-11-03 DIAGNOSIS — I359 Nonrheumatic aortic valve disorder, unspecified: Secondary | ICD-10-CM

## 2013-11-03 DIAGNOSIS — Z954 Presence of other heart-valve replacement: Secondary | ICD-10-CM

## 2013-11-03 DIAGNOSIS — I4891 Unspecified atrial fibrillation: Secondary | ICD-10-CM | POA: Diagnosis not present

## 2013-11-03 DIAGNOSIS — Z5181 Encounter for therapeutic drug level monitoring: Secondary | ICD-10-CM

## 2013-11-03 LAB — POCT INR: INR: 2.2

## 2013-11-07 HISTORY — PX: ELECTROPHYSIOLOGIC STUDY: SHX172A

## 2013-11-09 ENCOUNTER — Other Ambulatory Visit: Payer: Self-pay | Admitting: Cardiology

## 2013-11-09 ENCOUNTER — Ambulatory Visit (INDEPENDENT_AMBULATORY_CARE_PROVIDER_SITE_OTHER): Payer: Medicare Other | Admitting: Pharmacist

## 2013-11-09 DIAGNOSIS — I359 Nonrheumatic aortic valve disorder, unspecified: Secondary | ICD-10-CM | POA: Diagnosis not present

## 2013-11-09 DIAGNOSIS — Z954 Presence of other heart-valve replacement: Secondary | ICD-10-CM

## 2013-11-09 DIAGNOSIS — Z5181 Encounter for therapeutic drug level monitoring: Secondary | ICD-10-CM

## 2013-11-09 DIAGNOSIS — I4891 Unspecified atrial fibrillation: Secondary | ICD-10-CM

## 2013-11-09 LAB — POCT INR: INR: 2.4

## 2013-11-17 ENCOUNTER — Ambulatory Visit (INDEPENDENT_AMBULATORY_CARE_PROVIDER_SITE_OTHER): Payer: Medicare Other | Admitting: Pharmacist

## 2013-11-17 DIAGNOSIS — Z5181 Encounter for therapeutic drug level monitoring: Secondary | ICD-10-CM

## 2013-11-17 DIAGNOSIS — I4891 Unspecified atrial fibrillation: Secondary | ICD-10-CM

## 2013-11-17 DIAGNOSIS — Z954 Presence of other heart-valve replacement: Secondary | ICD-10-CM

## 2013-11-17 DIAGNOSIS — I359 Nonrheumatic aortic valve disorder, unspecified: Secondary | ICD-10-CM | POA: Diagnosis not present

## 2013-11-17 LAB — POCT INR: INR: 3.2

## 2013-11-18 ENCOUNTER — Telehealth: Payer: Self-pay | Admitting: Pharmacist

## 2013-11-18 NOTE — Telephone Encounter (Signed)
Message copied by Bishop Limbo on Wed Nov 18, 2013  8:17 AM ------      Message from: ALVSTAD, KRISTIN L.      Created: Tue Nov 17, 2013  4:33 PM       Ysidro Evert            Ms Bolduc wanted you to know that she spoke with Acie Fredrickson regarding holding warfarin for ablation with Dr. Lehman Prom.  They want her to continue warfarin, but need INR check this Thursday.  I've already scheduled her.              Erasmo Downer ------

## 2013-11-18 NOTE — Telephone Encounter (Signed)
Patient to continue warfarin given h/o AVR and not hold prior to ablation.  Will check INR tomorrow to make sure it is appropriate prior to procedure early next week.

## 2013-11-19 ENCOUNTER — Ambulatory Visit (INDEPENDENT_AMBULATORY_CARE_PROVIDER_SITE_OTHER): Payer: Medicare Other | Admitting: Pharmacist

## 2013-11-19 DIAGNOSIS — I359 Nonrheumatic aortic valve disorder, unspecified: Secondary | ICD-10-CM | POA: Diagnosis not present

## 2013-11-19 DIAGNOSIS — Z954 Presence of other heart-valve replacement: Secondary | ICD-10-CM | POA: Diagnosis not present

## 2013-11-19 DIAGNOSIS — I4891 Unspecified atrial fibrillation: Secondary | ICD-10-CM

## 2013-11-19 DIAGNOSIS — Z5181 Encounter for therapeutic drug level monitoring: Secondary | ICD-10-CM

## 2013-11-19 LAB — POCT INR: INR: 2.1

## 2013-11-23 DIAGNOSIS — F411 Generalized anxiety disorder: Secondary | ICD-10-CM | POA: Diagnosis present

## 2013-11-23 DIAGNOSIS — F3289 Other specified depressive episodes: Secondary | ICD-10-CM | POA: Diagnosis present

## 2013-11-23 DIAGNOSIS — J810 Acute pulmonary edema: Secondary | ICD-10-CM | POA: Diagnosis not present

## 2013-11-23 DIAGNOSIS — I440 Atrioventricular block, first degree: Secondary | ICD-10-CM | POA: Diagnosis not present

## 2013-11-23 DIAGNOSIS — E8779 Other fluid overload: Secondary | ICD-10-CM | POA: Diagnosis not present

## 2013-11-23 DIAGNOSIS — G4733 Obstructive sleep apnea (adult) (pediatric): Secondary | ICD-10-CM | POA: Diagnosis not present

## 2013-11-23 DIAGNOSIS — I059 Rheumatic mitral valve disease, unspecified: Secondary | ICD-10-CM | POA: Diagnosis not present

## 2013-11-23 DIAGNOSIS — Z7901 Long term (current) use of anticoagulants: Secondary | ICD-10-CM | POA: Diagnosis not present

## 2013-11-23 DIAGNOSIS — I5032 Chronic diastolic (congestive) heart failure: Secondary | ICD-10-CM | POA: Diagnosis not present

## 2013-11-23 DIAGNOSIS — Z5189 Encounter for other specified aftercare: Secondary | ICD-10-CM | POA: Diagnosis not present

## 2013-11-23 DIAGNOSIS — G4730 Sleep apnea, unspecified: Secondary | ICD-10-CM | POA: Diagnosis not present

## 2013-11-23 DIAGNOSIS — I10 Essential (primary) hypertension: Secondary | ICD-10-CM | POA: Diagnosis not present

## 2013-11-23 DIAGNOSIS — Z5181 Encounter for therapeutic drug level monitoring: Secondary | ICD-10-CM | POA: Diagnosis not present

## 2013-11-23 DIAGNOSIS — I9589 Other hypotension: Secondary | ICD-10-CM | POA: Diagnosis not present

## 2013-11-23 DIAGNOSIS — I079 Rheumatic tricuspid valve disease, unspecified: Secondary | ICD-10-CM | POA: Diagnosis present

## 2013-11-23 DIAGNOSIS — R042 Hemoptysis: Secondary | ICD-10-CM | POA: Diagnosis not present

## 2013-11-23 DIAGNOSIS — I517 Cardiomegaly: Secondary | ICD-10-CM | POA: Diagnosis not present

## 2013-11-23 DIAGNOSIS — R0902 Hypoxemia: Secondary | ICD-10-CM | POA: Diagnosis not present

## 2013-11-23 DIAGNOSIS — I5033 Acute on chronic diastolic (congestive) heart failure: Secondary | ICD-10-CM | POA: Diagnosis present

## 2013-11-23 DIAGNOSIS — I509 Heart failure, unspecified: Secondary | ICD-10-CM | POA: Diagnosis present

## 2013-11-23 DIAGNOSIS — I4891 Unspecified atrial fibrillation: Secondary | ICD-10-CM | POA: Diagnosis not present

## 2013-11-23 DIAGNOSIS — Z9889 Other specified postprocedural states: Secondary | ICD-10-CM | POA: Diagnosis not present

## 2013-11-23 DIAGNOSIS — E785 Hyperlipidemia, unspecified: Secondary | ICD-10-CM | POA: Diagnosis present

## 2013-11-23 DIAGNOSIS — K219 Gastro-esophageal reflux disease without esophagitis: Secondary | ICD-10-CM | POA: Diagnosis present

## 2013-11-23 DIAGNOSIS — R0602 Shortness of breath: Secondary | ICD-10-CM | POA: Diagnosis not present

## 2013-11-23 DIAGNOSIS — F329 Major depressive disorder, single episode, unspecified: Secondary | ICD-10-CM | POA: Diagnosis present

## 2013-11-23 DIAGNOSIS — J811 Chronic pulmonary edema: Secondary | ICD-10-CM | POA: Diagnosis not present

## 2013-11-23 DIAGNOSIS — Z954 Presence of other heart-valve replacement: Secondary | ICD-10-CM | POA: Diagnosis not present

## 2013-11-23 DIAGNOSIS — I739 Peripheral vascular disease, unspecified: Secondary | ICD-10-CM | POA: Diagnosis present

## 2013-11-23 DIAGNOSIS — I369 Nonrheumatic tricuspid valve disorder, unspecified: Secondary | ICD-10-CM | POA: Diagnosis not present

## 2013-11-23 DIAGNOSIS — J90 Pleural effusion, not elsewhere classified: Secondary | ICD-10-CM | POA: Diagnosis not present

## 2013-11-23 DIAGNOSIS — E669 Obesity, unspecified: Secondary | ICD-10-CM | POA: Diagnosis not present

## 2013-11-24 DIAGNOSIS — I4891 Unspecified atrial fibrillation: Secondary | ICD-10-CM | POA: Diagnosis not present

## 2013-11-29 ENCOUNTER — Other Ambulatory Visit: Payer: Self-pay | Admitting: Cardiology

## 2013-12-01 ENCOUNTER — Ambulatory Visit (INDEPENDENT_AMBULATORY_CARE_PROVIDER_SITE_OTHER): Payer: Medicare Other | Admitting: Pharmacist

## 2013-12-01 DIAGNOSIS — Z5181 Encounter for therapeutic drug level monitoring: Secondary | ICD-10-CM | POA: Diagnosis not present

## 2013-12-01 DIAGNOSIS — I4891 Unspecified atrial fibrillation: Secondary | ICD-10-CM | POA: Diagnosis not present

## 2013-12-01 DIAGNOSIS — Z954 Presence of other heart-valve replacement: Secondary | ICD-10-CM | POA: Diagnosis not present

## 2013-12-01 DIAGNOSIS — I359 Nonrheumatic aortic valve disorder, unspecified: Secondary | ICD-10-CM

## 2013-12-01 LAB — POCT INR: INR: 2.1

## 2013-12-04 ENCOUNTER — Ambulatory Visit (INDEPENDENT_AMBULATORY_CARE_PROVIDER_SITE_OTHER): Payer: Medicare Other | Admitting: Pharmacist

## 2013-12-04 DIAGNOSIS — I359 Nonrheumatic aortic valve disorder, unspecified: Secondary | ICD-10-CM

## 2013-12-04 DIAGNOSIS — I4891 Unspecified atrial fibrillation: Secondary | ICD-10-CM

## 2013-12-04 DIAGNOSIS — Z5181 Encounter for therapeutic drug level monitoring: Secondary | ICD-10-CM | POA: Diagnosis not present

## 2013-12-04 DIAGNOSIS — Z954 Presence of other heart-valve replacement: Secondary | ICD-10-CM | POA: Diagnosis not present

## 2013-12-04 LAB — POCT INR: INR: 2

## 2013-12-11 ENCOUNTER — Ambulatory Visit (INDEPENDENT_AMBULATORY_CARE_PROVIDER_SITE_OTHER): Payer: Medicare Other | Admitting: Pharmacist

## 2013-12-11 DIAGNOSIS — Z5181 Encounter for therapeutic drug level monitoring: Secondary | ICD-10-CM | POA: Diagnosis not present

## 2013-12-11 DIAGNOSIS — I4891 Unspecified atrial fibrillation: Secondary | ICD-10-CM | POA: Diagnosis not present

## 2013-12-11 DIAGNOSIS — Z954 Presence of other heart-valve replacement: Secondary | ICD-10-CM | POA: Diagnosis not present

## 2013-12-11 DIAGNOSIS — I359 Nonrheumatic aortic valve disorder, unspecified: Secondary | ICD-10-CM

## 2013-12-11 LAB — POCT INR: INR: 2.6

## 2013-12-14 ENCOUNTER — Other Ambulatory Visit (HOSPITAL_COMMUNITY): Payer: Self-pay | Admitting: Cardiology

## 2013-12-25 ENCOUNTER — Ambulatory Visit (INDEPENDENT_AMBULATORY_CARE_PROVIDER_SITE_OTHER): Payer: Medicare Other | Admitting: Pharmacist

## 2013-12-25 ENCOUNTER — Ambulatory Visit (INDEPENDENT_AMBULATORY_CARE_PROVIDER_SITE_OTHER): Payer: Medicare Other | Admitting: Cardiology

## 2013-12-25 VITALS — BP 142/88 | HR 86 | Ht 67.0 in | Wt 226.8 lb

## 2013-12-25 DIAGNOSIS — I4891 Unspecified atrial fibrillation: Secondary | ICD-10-CM | POA: Diagnosis not present

## 2013-12-25 DIAGNOSIS — I509 Heart failure, unspecified: Secondary | ICD-10-CM

## 2013-12-25 DIAGNOSIS — G4733 Obstructive sleep apnea (adult) (pediatric): Secondary | ICD-10-CM | POA: Diagnosis not present

## 2013-12-25 DIAGNOSIS — I359 Nonrheumatic aortic valve disorder, unspecified: Secondary | ICD-10-CM | POA: Diagnosis not present

## 2013-12-25 DIAGNOSIS — Z7901 Long term (current) use of anticoagulants: Secondary | ICD-10-CM | POA: Diagnosis not present

## 2013-12-25 DIAGNOSIS — Z954 Presence of other heart-valve replacement: Secondary | ICD-10-CM

## 2013-12-25 DIAGNOSIS — Z952 Presence of prosthetic heart valve: Secondary | ICD-10-CM

## 2013-12-25 DIAGNOSIS — Z5181 Encounter for therapeutic drug level monitoring: Secondary | ICD-10-CM

## 2013-12-25 DIAGNOSIS — I5032 Chronic diastolic (congestive) heart failure: Secondary | ICD-10-CM

## 2013-12-25 DIAGNOSIS — I10 Essential (primary) hypertension: Secondary | ICD-10-CM

## 2013-12-25 LAB — BASIC METABOLIC PANEL
BUN: 17 mg/dL (ref 6–23)
CO2: 30 mEq/L (ref 19–32)
CREATININE: 0.8 mg/dL (ref 0.4–1.2)
Calcium: 9.3 mg/dL (ref 8.4–10.5)
Chloride: 101 mEq/L (ref 96–112)
GFR: 80.31 mL/min (ref >60.00)
Glucose, Bld: 97 mg/dL (ref 70–99)
POTASSIUM: 4.6 meq/L (ref 3.5–5.1)
Sodium: 139 mEq/L (ref 135–145)

## 2013-12-25 LAB — POCT INR: INR: 2.7

## 2013-12-25 LAB — MAGNESIUM: MAGNESIUM: 2.1 mg/dL (ref 1.5–2.5)

## 2013-12-25 NOTE — Patient Instructions (Signed)
Your physician recommends that you continue on your current medications as directed. Please refer to the Current Medication list given to you today.  Your physician recommends that you go to the lab today for a BMET and Magnesium  We will set you up at [facility] for a CPAP Titration   Your physician recommends that you schedule a follow-up appointment in:3 Months with Dr Radford Pax

## 2013-12-25 NOTE — Progress Notes (Signed)
[place], [place], [address]  [address] Phone: [phone] Fax:  [phone]  Date:  12/25/2013   ID:  [name], DOB Jun 25, 1945, MRN [id number]  PCP:  [name], MD  Cardiologist:  Fransico Him, MD    History of Present Illness: [name] is a 68 y.o. female with a history of afib s/p failed Tikosyn load and recurrent DCCV which failed to maintain NSR. She was loaded on amio and underwent recurrent DCCV to NSR 12/22 but this did not hold and she reverted back to afib by office visit on 04/14/2013. Marland Kitchen She also has a history of OSA on VPAP auto, AS s/p mechanical AVR and chronic anticoagulation.  She saw Dr. Rayann Heman on 05/14/13 for evaluation of possible ablation due to failing medical therapy with beta blockers/CCB/Tikosyn/amiodarone. She told him she wanted to think about it and would get back to him. SHe decided she did not want to pursue atrial fibrillation ablation. She then saw Dr. Rayann Heman back again in May and decided that she wanted to proceed with ablation.  She underwent TEE showing severe LA enlargement >87mm and it was felt that success rate of PVI was low. She was evaluated for combined epicardial and endocardial RF ablation but due to previous sternotomy and no previous endocardial procedure that treatment option was ruled out.   She was referred to [facility] and saw Dr. Lonia Mad and underwent RF ablation with PVI.  She is now on Tikosyn and is here today for followup.  She denies any chest pain, SOB, DOE, LE edema or dizziness.  She has had some issues with her BiPAP that started right after she had the ablation.  Everytime she goes to put on the BiPAP she starts to cough.  She thinks it might be related to throat irritation from when she was intubated for her ablation.  Therefore she has not been using it.  She has lost 20 lbs and also feels like the pressure is too high.  She tolerates the mask fine with no problems    Wt Readings from Last 3 Encounters:    12/25/13 226 lb 12.8 oz (102.876 kg)  10/08/13 230 lb (104.327 kg)  10/08/13 230 lb (104.327 kg)     Past Medical History  Diagnosis Date  . Obesity   . Hypertension   . Aortic stenosis     status post aortic valve replacement with St. Jude mechanical prosthesis  . Subdural hematoma     in setting of INR greater than 2.2 shortly after AVR - now cleared by neurosurgery to maintain INR 2-2.5  . Dyslipidemia   . Anxiety   . Exertional shortness of breath   . Arthritis     "right little finger" (11/12/2012)  . Depression   . 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 Sleep apnea     "BiPAP" (11/12/2012)  . GERD (gastroesophageal reflux disease)     "several years ago; none since" (11/12/2012)  . Chronic anticoagulation   . Hyperlipidemia   . Persistent atrial fibrillation     4/14 TEE cardioversion success but return to Atrial fibrillation on 07/29/12 then S/P TEE/DCCV after Tikosyn load and now back in atrial fibrillation    Current Outpatient Prescriptions  Medication Sig Dispense Refill  . Ascorbic Acid (VITAMIN C) 1000 MG tablet Take 2,000 mg by mouth daily.      Marland Kitchen buPROPion (WELLBUTRIN XL) 150 MG 24 hr tablet Take  150 mg by mouth daily.      . calcium-vitamin D (OSCAL WITH D) 500-200 MG-UNIT per tablet Take 2 tablets by mouth daily.       . Cholecalciferol (VITAMIN D) 2000 UNITS tablet Take 4,000 Units by mouth daily.      . colchicine 0.6 MG tablet Take 0.6 mg by mouth 2 (two) times daily.      Marland Kitchen diltiazem (TIAZAC) 240 MG 24 hr capsule Take 240 mg by mouth daily.      Marland Kitchen dofetilide (TIKOSYN) 250 MCG capsule Take 250 mcg by mouth 2 (two) times daily.      . furosemide (LASIX) 80 MG tablet TAKE 1 TABLET BY MOUTH EVERY DAY  90 tablet  0  . losartan (COZAAR) 50 MG tablet Take 50 mg by mouth daily.      . Multiple Vitamin (MULTIVITAMIN WITH MINERALS) TABS Take 1 tablet by mouth daily.      . pantoprazole (PROTONIX) 40 MG tablet Take 40 mg by mouth 2  (two) times daily.      . potassium chloride SA (K-DUR,KLOR-CON) 20 MEQ tablet Take 20 mEq by mouth daily.       . sertraline (ZOLOFT) 100 MG tablet Take 150 mg by mouth at bedtime.      . sucralfate (CARAFATE) 1 G tablet Take 1 g by mouth 4 (four) times daily -  with meals and at bedtime.      . tretinoin (RETIN-A) 0.05 % cream Apply 1 application topically at bedtime.       Marland Kitchen warfarin (COUMADIN) 4 MG tablet TAKE 1 TABLET BY MOUTH ON ALL DAYS OF THE WEEK OR AS DIRECTED BY COUMADIN CLINIC  105 tablet  1  . zolpidem (AMBIEN) 10 MG tablet Take 5 mg by mouth at bedtime as needed for sleep.       No current facility-administered medications for this visit.    Allergies:    Allergies  Allergen Reactions  . Codeine Anaphylaxis    jerking jerking  . Beta Adrenergic Blockers     Makes her crazy Makes her crazy  . Metoprolol     depression  . Toprol Xl [Metoprolol Tartrate]     depression  . Dilantin [Phenytoin Sodium Extended] Rash and Itching    Social History:  The patient  reports that she has never smoked. She has never used smokeless tobacco. She reports that she drinks alcohol. She reports that she does not use illicit drugs.   Family History:  The patient's family history includes Heart disease in her brother; Hyperlipidemia in her father; Hypertension in her father and mother; Lung cancer in her mother.   ROS:  Please see the history of present illness.      All other systems reviewed and negative.   PHYSICAL EXAM: VS:  BP 142/88  Pulse 86  Ht 5\' 7"  (1.702 m)  Wt 226 lb 12.8 oz (102.876 kg)  BMI 35.51 kg/m2 Well nourished, well developed, in no acute distress HEENT: normal Neck: no JVD Cardiac:  normal S1, S2; RRR; no murmur Lungs:  clear to auscultation bilaterally, no wheezing, rhonchi or rales Abd: soft, nontender, no hepatomegaly Ext: no edema Skin: warm and dry Neuro:  CNs 2-12 intact, no focal abnormalities noted  EKG:  NSR with first degree AV block , QTc  442msec with nonspecific T wave abnormality  ASSESSMENT/PLAN:  1. Atrial fibrillation - she has failed medical therapy and several attempts at [place] now s/p afib ablation with PVI maintaining  NSR with QTc 428msec - continue Tikosyn/warfarin/diltiazem  - check BMET and Mg today 2. Chronic systemic anticoagulation  3. OSA on BiPAP ASV therapy - she has been intolerant of her device recently due to coughing that starts as soon as she starts the BiPAP.  She also thinks that the pressure is too high.  Since she has lost about 20lbs I would like to get a CPAP titration to see if I can get her off BiPAP ASV and possibly on to just CPAP 4. Obesity  5. AS s/p mechanical AVR on chronic systemic anticoagulation 6. Chronic diastolic CHF - compensated  - continue Lasix  - check BMET/Mg 7. HTN well controlled  - continue losartan/diltiazem   Followup with me in 3 months    Signed, Fransico Him, MD [facility] 12/25/2013 9:47 AM

## 2014-01-07 ENCOUNTER — Ambulatory Visit (INDEPENDENT_AMBULATORY_CARE_PROVIDER_SITE_OTHER): Payer: Medicare Other

## 2014-01-07 DIAGNOSIS — I359 Nonrheumatic aortic valve disorder, unspecified: Secondary | ICD-10-CM

## 2014-01-07 DIAGNOSIS — Z5181 Encounter for therapeutic drug level monitoring: Secondary | ICD-10-CM | POA: Diagnosis not present

## 2014-01-07 DIAGNOSIS — I4891 Unspecified atrial fibrillation: Secondary | ICD-10-CM | POA: Diagnosis not present

## 2014-01-07 LAB — POCT INR: INR: 2

## 2014-01-08 DIAGNOSIS — I4891 Unspecified atrial fibrillation: Secondary | ICD-10-CM | POA: Diagnosis not present

## 2014-01-21 ENCOUNTER — Ambulatory Visit (INDEPENDENT_AMBULATORY_CARE_PROVIDER_SITE_OTHER): Payer: Medicare Other | Admitting: *Deleted

## 2014-01-21 DIAGNOSIS — I359 Nonrheumatic aortic valve disorder, unspecified: Secondary | ICD-10-CM | POA: Diagnosis not present

## 2014-01-21 DIAGNOSIS — Z5181 Encounter for therapeutic drug level monitoring: Secondary | ICD-10-CM | POA: Diagnosis not present

## 2014-01-21 DIAGNOSIS — I4891 Unspecified atrial fibrillation: Secondary | ICD-10-CM

## 2014-01-21 LAB — POCT INR: INR: 1.8

## 2014-01-29 DIAGNOSIS — Z23 Encounter for immunization: Secondary | ICD-10-CM | POA: Diagnosis not present

## 2014-02-03 ENCOUNTER — Ambulatory Visit (INDEPENDENT_AMBULATORY_CARE_PROVIDER_SITE_OTHER): Payer: Medicare Other | Admitting: *Deleted

## 2014-02-03 DIAGNOSIS — I4891 Unspecified atrial fibrillation: Secondary | ICD-10-CM

## 2014-02-03 DIAGNOSIS — Z5181 Encounter for therapeutic drug level monitoring: Secondary | ICD-10-CM | POA: Diagnosis not present

## 2014-02-03 DIAGNOSIS — I359 Nonrheumatic aortic valve disorder, unspecified: Secondary | ICD-10-CM

## 2014-02-03 LAB — POCT INR: INR: 1.7

## 2014-02-17 ENCOUNTER — Ambulatory Visit (INDEPENDENT_AMBULATORY_CARE_PROVIDER_SITE_OTHER): Payer: Medicare Other | Admitting: Surgery

## 2014-02-17 DIAGNOSIS — I4891 Unspecified atrial fibrillation: Secondary | ICD-10-CM | POA: Diagnosis not present

## 2014-02-17 DIAGNOSIS — I359 Nonrheumatic aortic valve disorder, unspecified: Secondary | ICD-10-CM

## 2014-02-17 DIAGNOSIS — Z5181 Encounter for therapeutic drug level monitoring: Secondary | ICD-10-CM | POA: Diagnosis not present

## 2014-02-17 LAB — POCT INR: INR: 2.1

## 2014-02-21 ENCOUNTER — Ambulatory Visit (HOSPITAL_BASED_OUTPATIENT_CLINIC_OR_DEPARTMENT_OTHER): Payer: Medicare Other | Attending: Cardiology

## 2014-02-21 VITALS — Ht 67.0 in | Wt 220.0 lb

## 2014-02-21 DIAGNOSIS — Z9989 Dependence on other enabling machines and devices: Secondary | ICD-10-CM

## 2014-02-21 DIAGNOSIS — Z6834 Body mass index (BMI) 34.0-34.9, adult: Secondary | ICD-10-CM | POA: Insufficient documentation

## 2014-02-21 DIAGNOSIS — G4733 Obstructive sleep apnea (adult) (pediatric): Secondary | ICD-10-CM | POA: Diagnosis not present

## 2014-02-22 ENCOUNTER — Other Ambulatory Visit: Payer: Self-pay | Admitting: Cardiology

## 2014-02-22 ENCOUNTER — Telehealth: Payer: Self-pay | Admitting: Cardiology

## 2014-02-22 NOTE — Telephone Encounter (Signed)
New message           Pt needs an ekg / please give pt a call

## 2014-02-24 NOTE — Telephone Encounter (Signed)
Pt st she had cardioversion 8/19 and her HR had been regular since. On 12 and 13 of November, HR was irregular and skipping beats.  Since then, her rate has gone back to normal and has stayed in rhythm. Instructed patient to call if her rhythm becomes irregular or experiences cardiac symptoms.

## 2014-02-26 ENCOUNTER — Other Ambulatory Visit: Payer: Self-pay | Admitting: Cardiology

## 2014-02-26 ENCOUNTER — Telehealth: Payer: Self-pay | Admitting: Cardiology

## 2014-02-26 NOTE — Sleep Study (Addendum)
   NAME: [name] DATE OF BIRTH:  September 16, 1945 MEDICAL RECORD NUMBER [id number]  LOCATION: [facility]  PHYSICIAN: TURNER,TRACI R  DATE OF STUDY: 02/21/2014  SLEEP STUDY TYPE: Positive Airway Pressure Titration               REFERRING PHYSICIAN: [name], MD  INDICATION FOR STUDY: OSA  EPWORTH SLEEPINESS SCORE: 5 HEIGHT: 5\' 7"  (170.2 cm)  WEIGHT: 220 lb (99.791 kg)    Body mass index is 34.45 kg/(m^2).  NECK SIZE: 14.5in. BMI:34  MEDICATIONS: Reviewed in the chart  SLEEP ARCHITECTURE: The patient slept for a total of 356 minutes with 4.5 minutes of slow wave sleep and 115 minutes of REM sleep.  The onset to sleep latency was normal at 6.5 minutes and onset to REM latency was normal at 104 minutes.  The sleep efficiency was normal at 95%.   RESPIRATORY DATA: The patient was started on CPAP at 4cm H2O and increased for respiratory events, snoring and arousals to 13cm H2O.  There were no apneas and a total of 72 hypopneas during the study.  Most events occurred in REM supine sleep.  The patient was able to achieve REM supine sleep for a prolonged period of time at 12cm H2O without any further significant respiratory events.  The AHI at 12cm H2O was 5.7 events per hour and the lowest O2 sat was 84% at this pressure.    OXYGEN DATA: The lowest O2 sat at a pressure of 7cm H2O was 84%.  CARDIAC DATA: The patient maintained NSR with PAC's and PVC's during the study.  MOVEMENT/PARASOMNIA: There were no periodic limb movements or sleep behavior disorders during the titration.  IMPRESSION/ RECOMMENDATION:   1.  Successful CPAP titration to 7cm H2O with no further significant respiratory events in the REM supine position. 2.  Transient O2 desaturations <88% for 49 minutes during the study.   3.  Obesity 4.  PVC's and PAC's noted during the study.   5.  Recommend Resmed CPAP device with heated humidifier and C flex of 3 to be set at 12cm H2O with a medium  Resmed AirFit P10 nasal pillow mask with chin strap. 6.  Overnight pulse oximetry on CPAP to determine if she has any O2 desat <88% for greater than 5 minutes which would indicate need for supplemental O2 with her CPAP device at night.   Signed: [name] Diplomate, American Board of Sleep Medicine  ELECTRONICALLY SIGNED ON:  02/26/2014, 5:28 PM [place] PH: [phone]   FX: [phone] [place]

## 2014-02-26 NOTE — Telephone Encounter (Signed)
Please let patient know that she had successful CPAP titration.  Please let Darlina Guys know with [facility] that I have placed an order in Epic for CPAP.  Please set up OV with me in 10 weeks

## 2014-02-26 NOTE — Addendum Note (Signed)
Addended by: [name] on: 02/26/2014 05:43 PM   Modules accepted: Orders

## 2014-03-01 NOTE — Telephone Encounter (Signed)
Informed patient she had a successful CPAP titration.  F/U appointment made for 05/10/2014. Staff message sent to Darlina Guys to inform her CPAP order is in EPIC.   Patient agrees with treatment plan.

## 2014-03-14 ENCOUNTER — Other Ambulatory Visit: Payer: Self-pay | Admitting: Cardiology

## 2014-03-15 ENCOUNTER — Telehealth: Payer: Self-pay | Admitting: Cardiology

## 2014-03-15 IMAGING — CR DG CHEST 2V
2 series · 2 of 2 positions shown · non-contrast
Comparison: 08/17/2004.

CLINICAL DATA: Congestive heart failure.

CHEST - 2 VIEW

[w chest pa]
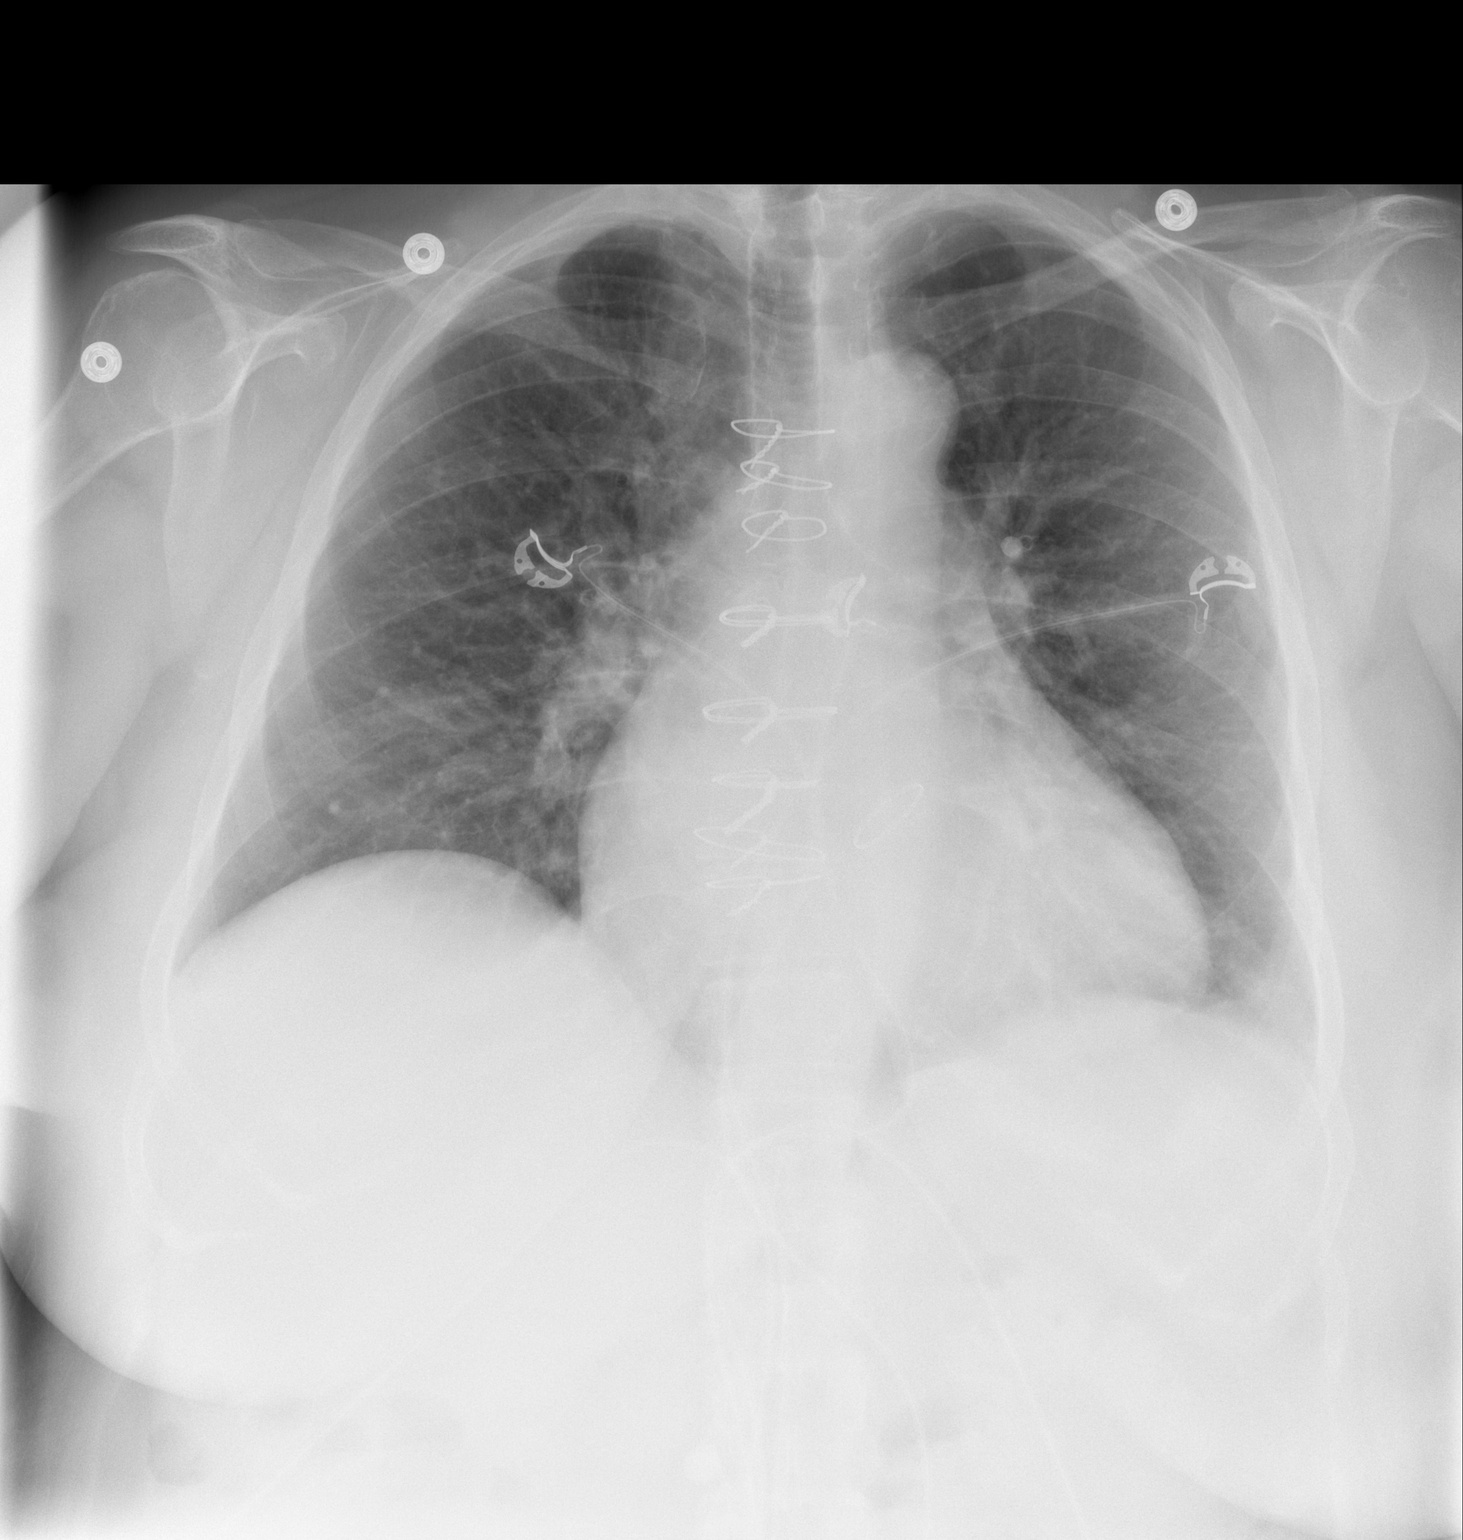

[w chest lat]
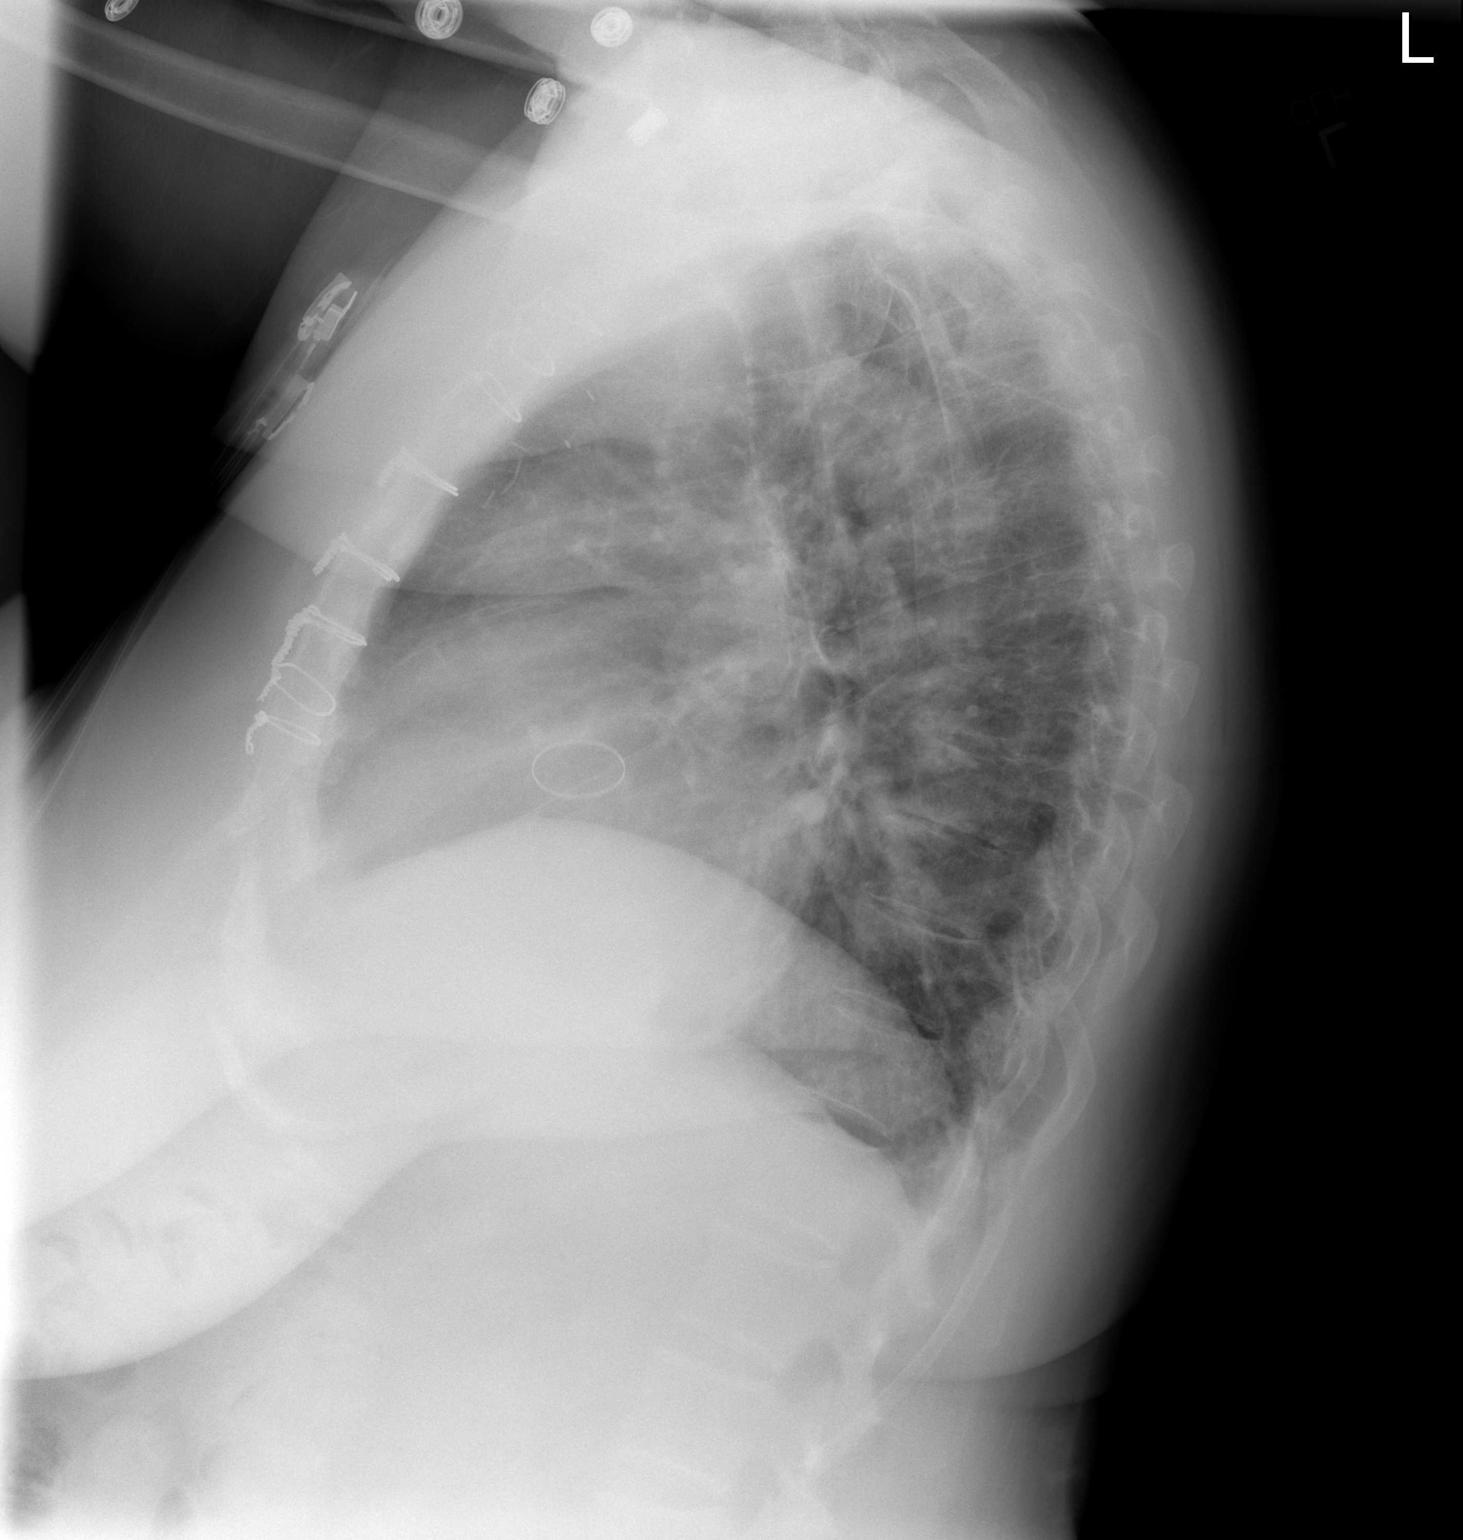

[2 of 2 positions shown; findings below may reference images not displayed]

FINDINGS: Trachea is midline.  Heart is at the upper limits of
normal in size, stable.  Minimal linear opacification at the left
lung base.  Lungs are otherwise clear.  No pleural fluid.
IMPRESSION: Left basilar subsegmental atelectasis.

## 2014-03-15 NOTE — Telephone Encounter (Signed)
New message  Pt called states that she was supposed to receive a call from [facility] to retitrate her CPAP Machine.. She has not recieved a call, please call back to discuss//sr

## 2014-03-15 NOTE — Telephone Encounter (Signed)
Left message for [facility] to call back 

## 2014-03-16 ENCOUNTER — Ambulatory Visit (INDEPENDENT_AMBULATORY_CARE_PROVIDER_SITE_OTHER): Payer: Medicare Other

## 2014-03-16 DIAGNOSIS — I4891 Unspecified atrial fibrillation: Secondary | ICD-10-CM | POA: Diagnosis not present

## 2014-03-16 DIAGNOSIS — I359 Nonrheumatic aortic valve disorder, unspecified: Secondary | ICD-10-CM

## 2014-03-16 DIAGNOSIS — Z5181 Encounter for therapeutic drug level monitoring: Secondary | ICD-10-CM | POA: Diagnosis not present

## 2014-03-16 LAB — POCT INR: INR: 2.1

## 2014-03-16 NOTE — Telephone Encounter (Signed)
Left message to call back  

## 2014-03-17 NOTE — Telephone Encounter (Signed)
F/U  Pt returning call may contact at [phone].

## 2014-03-17 NOTE — Telephone Encounter (Signed)
Patient st [facility] has not called her.  Informed patient I will message [facility] as a reminder. Patient st she has an appointment on 12/18 with Dr. Radford Pax and also in February as a sleep F/U. She said she will call after her appointment with Dr. Lonia Mad Friday and send over an EKG and see if Dr. Radford Pax can see her just in February instead.

## 2014-03-19 ENCOUNTER — Other Ambulatory Visit: Payer: Self-pay | Admitting: Cardiology

## 2014-03-19 DIAGNOSIS — R9431 Abnormal electrocardiogram [ECG] [EKG]: Secondary | ICD-10-CM | POA: Diagnosis not present

## 2014-03-19 DIAGNOSIS — I4891 Unspecified atrial fibrillation: Secondary | ICD-10-CM | POA: Diagnosis not present

## 2014-03-26 ENCOUNTER — Ambulatory Visit (INDEPENDENT_AMBULATORY_CARE_PROVIDER_SITE_OTHER): Payer: Medicare Other | Admitting: Cardiology

## 2014-03-26 ENCOUNTER — Encounter: Payer: Self-pay | Admitting: Cardiology

## 2014-03-26 VITALS — BP 140/90 | HR 86 | Ht 67.0 in | Wt 231.8 lb

## 2014-03-26 DIAGNOSIS — I480 Paroxysmal atrial fibrillation: Secondary | ICD-10-CM | POA: Diagnosis not present

## 2014-03-26 DIAGNOSIS — Z7901 Long term (current) use of anticoagulants: Secondary | ICD-10-CM

## 2014-03-26 DIAGNOSIS — G4733 Obstructive sleep apnea (adult) (pediatric): Secondary | ICD-10-CM

## 2014-03-26 DIAGNOSIS — Z952 Presence of prosthetic heart valve: Secondary | ICD-10-CM

## 2014-03-26 DIAGNOSIS — I5032 Chronic diastolic (congestive) heart failure: Secondary | ICD-10-CM

## 2014-03-26 DIAGNOSIS — I10 Essential (primary) hypertension: Secondary | ICD-10-CM

## 2014-03-26 DIAGNOSIS — I350 Nonrheumatic aortic (valve) stenosis: Secondary | ICD-10-CM

## 2014-03-26 LAB — BASIC METABOLIC PANEL
BUN: 18 mg/dL (ref 6–23)
CALCIUM: 9 mg/dL (ref 8.4–10.5)
CO2: 29 mEq/L (ref 19–32)
Chloride: 100 mEq/L (ref 96–112)
Creatinine, Ser: 0.7 mg/dL (ref 0.4–1.2)
GFR: 92.82 mL/min (ref >60.00)
Glucose, Bld: 85 mg/dL (ref 70–99)
POTASSIUM: 3.8 meq/L (ref 3.5–5.1)
Sodium: 137 mEq/L (ref 135–145)

## 2014-03-26 LAB — MAGNESIUM: MAGNESIUM: 2.1 mg/dL (ref 1.5–2.5)

## 2014-03-26 NOTE — Progress Notes (Signed)
[place], [place], [place]  [address] Phone: [phone] Fax:  [phone]  Date:  03/26/2014   ID:  CAMYRA VAETH, DOB 12/23/1945, MRN [id number]  PCP:  Jonathon Bellows, MD  Cardiologist:  Fransico Him, MD    History of Present Illness: [name] is a 68 y.o. female with a history of afib s/p failed Tikosyn load and recurrent DCCV which failed to maintain NSR. She was loaded on amio and underwent recurrent DCCV to NSR 12/22 but this did not hold and she reverted back to afib. She underwent afib ablation at [facility] and is now on Tikosyn.  She also has a history of OSA on VPAP auto, AS s/p mechanical AVR and chronic anticoagulation. She denies any chest pain, SOB, DOE, LE edema or dizziness. When I last saw her she had lost some weight and was having problems tolerating her BiPAP.  We repeated a split night study and were able to put her on CPAP at 7cm H2O.  She has not had her settings changed to CPAP yet.  She has an appt set up for 2 weeks from now.     Wt Readings from Last 3 Encounters:  03/26/14 231 lb 12.8 oz (105.144 kg)  02/21/14 220 lb (99.791 kg)  12/25/13 226 lb 12.8 oz (102.876 kg)     Past Medical History  Diagnosis Date  . Obesity   . Hypertension   . Aortic stenosis     status post aortic valve replacement with St. Jude mechanical prosthesis  . Subdural hematoma     in setting of INR greater than 2.2 shortly after AVR - now cleared by neurosurgery to maintain INR 2-2.5  . Dyslipidemia   . Anxiety   . Exertional shortness of breath   . Arthritis     "right little finger" (11/12/2012)  . Depression   . 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 Sleep apnea     "BiPAP" (11/12/2012)  . GERD (gastroesophageal reflux disease)     "several years ago; none since" (11/12/2012)  . Chronic anticoagulation   . Hyperlipidemia   . Persistent atrial fibrillation     4/14 TEE cardioversion success but return to Atrial  fibrillation on 07/29/12 then S/P TEE/DCCV after Tikosyn load and now back in atrial fibrillation    Current Outpatient Prescriptions  Medication Sig Dispense Refill  . Ascorbic Acid (VITAMIN C) 1000 MG tablet Take 2,000 mg by mouth daily.    Marland Kitchen buPROPion (WELLBUTRIN XL) 150 MG 24 hr tablet Take 150 mg by mouth daily.    . calcium-vitamin D (OSCAL WITH D) 500-200 MG-UNIT per tablet Take 2 tablets by mouth daily.     . Cholecalciferol (VITAMIN D) 2000 UNITS tablet Take 4,000 Units by mouth daily.    Marland Kitchen diltiazem (TIAZAC) 240 MG 24 hr capsule Take 240 mg by mouth daily.    Marland Kitchen dofetilide (TIKOSYN) 250 MCG capsule Take 250 mcg by mouth 2 (two) times daily.    . furosemide (LASIX) 80 MG tablet TAKE 1 TABLET BY MOUTH EVERY DAY 90 tablet 0  . losartan (COZAAR) 50 MG tablet TAKE 1 TABLET BY MOUTH DAILY 90 tablet 0  . Multiple Vitamin (MULTIVITAMIN WITH MINERALS) TABS Take 1 tablet by mouth daily.    . potassium chloride SA (K-DUR,KLOR-CON) 20 MEQ tablet TAKE 1 TABLET BY MOUTH TWICE DAILY 180 tablet 0  . sertraline (ZOLOFT) 100 MG tablet Take 150  mg by mouth at bedtime.    . tretinoin (RETIN-A) 0.05 % cream Apply 1 application topically at bedtime.     Marland Kitchen warfarin (COUMADIN) 4 MG tablet TAKE 1 TABLET BY MOUTH ON ALL DAYS OF THE WEEK OR AS DIRECTED BY COUMADIN CLINIC 105 tablet 1  . zolpidem (AMBIEN) 10 MG tablet Take 5 mg by mouth at bedtime as needed for sleep.     No current facility-administered medications for this visit.    Allergies:    Allergies  Allergen Reactions  . Codeine Anaphylaxis    jerking   . Beta Adrenergic Blockers     Makes her crazy   . Metoprolol     depression  . Propoxyphene Other (See Comments)    Bad vivid dreams  . Toprol Xl [Metoprolol Tartrate]     depression  . Dilantin [Phenytoin Sodium Extended] Rash and Itching    Social History:  The patient  reports that she has never smoked. She has never used smokeless tobacco. She reports that she drinks alcohol. She  reports that she does not use illicit drugs.   Family History:  The patient's family history includes Heart disease in her brother; Hyperlipidemia in her father; Hypertension in her father and mother; Lung cancer in her mother.   ROS:  Please see the history of present illness.      All other systems reviewed and negative.   PHYSICAL EXAM: VS:  BP 140/90 mmHg  Pulse 86  Ht 5\' 7"  (1.702 m)  Wt 231 lb 12.8 oz (105.144 kg)  BMI 36.30 kg/m2 Well nourished, well developed, in no acute distress HEENT: normal Neck: no JVD Cardiac:  normal S1, S2; RRR; no murmur Lungs:  clear to auscultation bilaterally, no wheezing, rhonchi or rales Abd: soft, nontender, no hepatomegaly Ext: no edema Skin: warm and dry Neuro:  CNs 2-12 intact, no focal abnormalities noted  EKG:     NSR at 86bpm with first degree AV block and PVC's and nonspecific ST abnormality  ASSESSMENT/PLAN:  1. Atrial fibrillation s/p afib ablation at [facility] -  maintaining NSR with QTc 486msec - continue Tikosyn/warfarin/diltiazem  - check BMET and Mg today 2. Chronic systemic anticoagulation  3. OSA now titrated to CPAP but her machine has not had levels changed.  I will call [facility] and have her device changed to CPAP settings today so that she started to use it. 4. Obesity  5. AS s/p mechanical AVR on chronic systemic anticoagulation 6. Chronic diastolic CHF - compensated  - continue Lasix  - check BMET/Mg 7. HTN bortderline controlled  - continue losartan/diltiazem   Followup with me in 10 weeks  Signed, Fransico Him, MD [facility] 03/26/2014 9:32 AM

## 2014-03-26 NOTE — Patient Instructions (Signed)
Your physician recommends that you have lab work TODAY (BMET, [place]).  Your physician recommends that you schedule a follow-up appointment in: 10 weeks with Dr. Radford Pax.

## 2014-03-29 ENCOUNTER — Telehealth: Payer: Self-pay | Admitting: Cardiology

## 2014-03-29 DIAGNOSIS — I10 Essential (primary) hypertension: Secondary | ICD-10-CM

## 2014-03-29 MED ORDER — POTASSIUM CHLORIDE CRYS ER 20 MEQ PO TBCR
EXTENDED_RELEASE_TABLET | ORAL | Status: DC
Start: 2014-03-29 — End: 2014-06-28

## 2014-03-29 NOTE — Telephone Encounter (Signed)
New message ° ° ° ° °Want lab results °

## 2014-03-29 NOTE — Telephone Encounter (Signed)
Patient informed of lab results and verbal understanding expressed.  Instructed patient to INCREASE potassium to Kdur 20 meq 2 tablets in the AM and 1 tablet in the PM. Repeat BMET scheduled for 12/29. Patient agrees with treatment plan.

## 2014-03-29 NOTE — Telephone Encounter (Signed)
-----   Message from [name], MD sent at 03/26/2014  3:44 PM EST ----- Potassium is below 4 and she is on Tikosyn.  Mg is ok.  Please have her increase Kdur to 65meq 2 tablets in the am and 1 tablet in the PM and recheck BMET in 1 week

## 2014-04-06 ENCOUNTER — Other Ambulatory Visit (INDEPENDENT_AMBULATORY_CARE_PROVIDER_SITE_OTHER): Payer: Medicare Other | Admitting: *Deleted

## 2014-04-06 DIAGNOSIS — I10 Essential (primary) hypertension: Secondary | ICD-10-CM

## 2014-04-06 LAB — BASIC METABOLIC PANEL
BUN: 16 mg/dL (ref 6–23)
CALCIUM: 9.1 mg/dL (ref 8.4–10.5)
CHLORIDE: 101 meq/L (ref 96–112)
CO2: 30 meq/L (ref 19–32)
Creatinine, Ser: 0.7 mg/dL (ref 0.4–1.2)
GFR: 96.11 mL/min (ref >60.00)
Glucose, Bld: 90 mg/dL (ref 70–99)
POTASSIUM: 4.1 meq/L (ref 3.5–5.1)
SODIUM: 137 meq/L (ref 135–145)

## 2014-04-13 ENCOUNTER — Ambulatory Visit (INDEPENDENT_AMBULATORY_CARE_PROVIDER_SITE_OTHER): Payer: Medicare Other

## 2014-04-13 DIAGNOSIS — Z5181 Encounter for therapeutic drug level monitoring: Secondary | ICD-10-CM | POA: Diagnosis not present

## 2014-04-13 DIAGNOSIS — I4891 Unspecified atrial fibrillation: Secondary | ICD-10-CM

## 2014-04-13 DIAGNOSIS — I359 Nonrheumatic aortic valve disorder, unspecified: Secondary | ICD-10-CM

## 2014-04-13 LAB — POCT INR: INR: 1.6

## 2014-04-19 DIAGNOSIS — M1712 Unilateral primary osteoarthritis, left knee: Secondary | ICD-10-CM | POA: Diagnosis not present

## 2014-04-22 ENCOUNTER — Encounter (HOSPITAL_COMMUNITY): Payer: Self-pay | Admitting: Internal Medicine

## 2014-04-23 ENCOUNTER — Ambulatory Visit (INDEPENDENT_AMBULATORY_CARE_PROVIDER_SITE_OTHER): Payer: Medicare Other

## 2014-04-23 DIAGNOSIS — I359 Nonrheumatic aortic valve disorder, unspecified: Secondary | ICD-10-CM | POA: Diagnosis not present

## 2014-04-23 DIAGNOSIS — Z5181 Encounter for therapeutic drug level monitoring: Secondary | ICD-10-CM | POA: Diagnosis not present

## 2014-04-23 DIAGNOSIS — I4891 Unspecified atrial fibrillation: Secondary | ICD-10-CM | POA: Diagnosis not present

## 2014-04-23 LAB — POCT INR: INR: 1.9

## 2014-05-10 ENCOUNTER — Ambulatory Visit: Payer: Medicare Other | Admitting: Cardiology

## 2014-05-10 ENCOUNTER — Telehealth: Payer: Self-pay | Admitting: Cardiology

## 2014-05-10 NOTE — Telephone Encounter (Signed)
New message      Request for surgical clearance:  1. What type of surgery is being performed? Knee replacement  2. When is this surgery scheduled? Not scheduled  3. Are there any medications that need to be held prior to surgery and how long? coumadin  4. Name of physician performing surgery? Dr Berenice Primas  5. What is your office phone and fax number?  6. Pt has not made the appt with Dr Berenice Primas yes, she want to make sure Dr Radford Pax says it will be ok

## 2014-05-10 NOTE — Telephone Encounter (Signed)
To Dr. Turner.  

## 2014-05-11 ENCOUNTER — Other Ambulatory Visit: Payer: Self-pay | Admitting: Cardiology

## 2014-05-12 NOTE — Telephone Encounter (Signed)
F/U       Pt calling about surgical clearance.    Please call.

## 2014-05-12 NOTE — Telephone Encounter (Signed)
Patient st she is not going to have the surgery until late March, but wants to start planning. Her orthopedic MD recommended talking to Dr. Radford Pax and getting clearance before scheduling.

## 2014-05-13 ENCOUNTER — Ambulatory Visit (INDEPENDENT_AMBULATORY_CARE_PROVIDER_SITE_OTHER): Payer: Medicare Other | Admitting: *Deleted

## 2014-05-13 DIAGNOSIS — I359 Nonrheumatic aortic valve disorder, unspecified: Secondary | ICD-10-CM

## 2014-05-13 DIAGNOSIS — I4891 Unspecified atrial fibrillation: Secondary | ICD-10-CM

## 2014-05-13 DIAGNOSIS — Z5181 Encounter for therapeutic drug level monitoring: Secondary | ICD-10-CM

## 2014-05-13 DIAGNOSIS — H2513 Age-related nuclear cataract, bilateral: Secondary | ICD-10-CM | POA: Diagnosis not present

## 2014-05-13 LAB — POCT INR: INR: 3.2

## 2014-05-13 MED ORDER — WARFARIN SODIUM 4 MG PO TABS: ORAL_TABLET | ORAL | Status: DC

## 2014-05-17 NOTE — Telephone Encounter (Signed)
Follow up      Pt is still waiting to hear if Dr Radford Pax will clear her for orthopedic surgery.  Please call and give an update

## 2014-05-23 NOTE — Telephone Encounter (Signed)
She would be fine to have orthopedic surgery but due to her MVR would have to be brought in a few days ahead for Heparin bridge.  I would avoid Lovenox bridge with her obesity (weight based dosing with Lovenox) and history of intracranial hemorrhage years ago  On Coumadin.  She has been cleared by Neurosurgery to maintain INR of 2-2.5 but with Heparin levels can be measured and I think would be safer.  She will need to let us know when her surgery is scheduled for.

## 2014-05-24 NOTE — Telephone Encounter (Signed)
Left message for patient that she has surgical clearance but will have to have special instructions beforehand. Instructed her to call back when she has a date for procedure.

## 2014-05-25 DIAGNOSIS — M1712 Unilateral primary osteoarthritis, left knee: Secondary | ICD-10-CM | POA: Diagnosis not present

## 2014-05-27 ENCOUNTER — Other Ambulatory Visit: Payer: Self-pay | Admitting: Cardiology

## 2014-05-28 NOTE — Telephone Encounter (Signed)
Surgical clearance request received for anti-coagulation therapy instructions prior to procedure.   Left message for [name], surgery coordinator, that patient will need bridging but a surgery date is needed for instruction.

## 2014-06-01 ENCOUNTER — Encounter: Payer: Self-pay | Admitting: Cardiology

## 2014-06-01 ENCOUNTER — Ambulatory Visit (INDEPENDENT_AMBULATORY_CARE_PROVIDER_SITE_OTHER): Payer: Medicare Other | Admitting: *Deleted

## 2014-06-01 ENCOUNTER — Ambulatory Visit (INDEPENDENT_AMBULATORY_CARE_PROVIDER_SITE_OTHER): Payer: Medicare Other | Admitting: Cardiology

## 2014-06-01 VITALS — BP 150/86 | HR 83 | Ht 67.0 in | Wt 227.8 lb

## 2014-06-01 DIAGNOSIS — I480 Paroxysmal atrial fibrillation: Secondary | ICD-10-CM | POA: Diagnosis not present

## 2014-06-01 DIAGNOSIS — I10 Essential (primary) hypertension: Secondary | ICD-10-CM | POA: Diagnosis not present

## 2014-06-01 DIAGNOSIS — R079 Chest pain, unspecified: Secondary | ICD-10-CM | POA: Diagnosis not present

## 2014-06-01 DIAGNOSIS — G4733 Obstructive sleep apnea (adult) (pediatric): Secondary | ICD-10-CM

## 2014-06-01 DIAGNOSIS — Z952 Presence of prosthetic heart valve: Secondary | ICD-10-CM

## 2014-06-01 DIAGNOSIS — Z5181 Encounter for therapeutic drug level monitoring: Secondary | ICD-10-CM | POA: Diagnosis not present

## 2014-06-01 DIAGNOSIS — I359 Nonrheumatic aortic valve disorder, unspecified: Secondary | ICD-10-CM

## 2014-06-01 DIAGNOSIS — I350 Nonrheumatic aortic (valve) stenosis: Secondary | ICD-10-CM | POA: Diagnosis not present

## 2014-06-01 DIAGNOSIS — R0602 Shortness of breath: Secondary | ICD-10-CM | POA: Diagnosis not present

## 2014-06-01 DIAGNOSIS — I4891 Unspecified atrial fibrillation: Secondary | ICD-10-CM

## 2014-06-01 DIAGNOSIS — I5032 Chronic diastolic (congestive) heart failure: Secondary | ICD-10-CM

## 2014-06-01 DIAGNOSIS — Z954 Presence of other heart-valve replacement: Secondary | ICD-10-CM

## 2014-06-01 LAB — BASIC METABOLIC PANEL
BUN: 14 mg/dL (ref 6–23)
CALCIUM: 9.5 mg/dL (ref 8.4–10.5)
CO2: 32 meq/L (ref 19–32)
Chloride: 102 mEq/L (ref 96–112)
Creatinine, Ser: 0.69 mg/dL (ref 0.40–1.20)
GFR: 89.67 mL/min (ref >60.00)
Glucose, Bld: 93 mg/dL (ref 70–99)
Potassium: 4.1 mEq/L (ref 3.5–5.1)
SODIUM: 138 meq/L (ref 135–145)

## 2014-06-01 LAB — BRAIN NATRIURETIC PEPTIDE: Pro B Natriuretic peptide (BNP): 56 pg/mL (ref 0.0–100.0)

## 2014-06-01 LAB — MAGNESIUM: MAGNESIUM: 2.3 mg/dL (ref 1.5–2.5)

## 2014-06-01 LAB — POCT INR: INR: 2.2

## 2014-06-01 NOTE — Patient Instructions (Signed)
Your physician recommends that you have lab work TODAY (BMET, Mag, BNP).  Your physician has requested that you have an echocardiogram. Echocardiography is a painless test that uses sound waves to create images of your heart. It provides your doctor with information about the size and shape of your heart and how well your heart's chambers and valves are working. This procedure takes approximately one hour. There are no restrictions for this procedure.  Your physician has requested that you have a lexiscan myoview. For further information please visit [email or web address]. Please follow instruction sheet, as given.  Your physician wants you to follow-up in: 6 months with Dr. Radford Pax. You will receive a reminder letter in the mail two months in advance. If you don't receive a letter, please call our office to schedule the follow-up appointment.

## 2014-06-01 NOTE — Progress Notes (Signed)
Cardiology Office Note   Date:  06/01/2014   ID:  [name], DOB 27-Apr-1945, MRN [id number]  PCP:  Jonathon Bellows, MD  Cardiologist:   [name], MD   Chief Complaint  Patient presents with  . Sleep Apnea  . Atrial Fibrillation  . Aortic Stenosis      History of Present Illness: [name] is a 69 y.o. female with a history of afib s/p failed Tikosyn load and recurrent DCCV which failed to maintain NSR. She was loaded on amio and underwent recurrent DCCV to NSR 12/22 but this did not hold and she reverted back to afib. She underwent afib ablation at [facility] and is now on Tikosyn. She also has a history of OSA on CPAP at 12cm H2O, AS s/p mechanical AVR and chronic anticoagulation. She denies any chest pain, SOB, DOE, LE edema or dizziness.  Recently she had lost some weight and was having problems tolerating her BiPAP. We repeated a split night study and were able to put her on CPAP at 12cm H2O. She is doing well with her new settings.  She sleeps through the night and does not snore.  She uses the nasal pillow mask with chin strap.  She feels rested in the am has no daytime sleepiness.  She says that she has had 2 episodes of chest pressure lasting about an hour both times at night while sitting down. She has noticed some increase in SOB recently and is concerned that she may be back in afib.  Her brother is in the hospital and has end stage COPD and has been running a lot more than usual.  She denies any LE edema, dizziness, palpitations or syncope.     Past Medical History  Diagnosis Date  . Obesity   . Hypertension   . Aortic stenosis     status post aortic valve replacement with St. Jude mechanical prosthesis  . Subdural hematoma     in setting of INR greater than 2.2 shortly after AVR - now cleared by neurosurgery to maintain INR 2-2.5  . Dyslipidemia   . Anxiety   . Exertional shortness of breath   . Arthritis     "right little finger" (11/12/2012)    . Depression   . 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 Sleep apnea     On CPAP at 12cm H2O  . GERD (gastroesophageal reflux disease)     "several years ago; none since" (11/12/2012)  . Chronic anticoagulation   . Hyperlipidemia   . Persistent atrial fibrillation     4/14 TEE cardioversion success but return to Atrial fibrillation on 07/29/12 then S/P TEE/DCCV after Tikosyn load and now back in atrial fibrillation    Past Surgical History  Procedure Laterality Date  . Burr hole for subdural hematoma  2006  . Knee arthroscopy Left 1980's    "2" (11/12/2012)  . Tee without cardioversion N/A 07/22/2012    Procedure: TRANSESOPHAGEAL ECHOCARDIOGRAM (TEE);  Surgeon: [name], MD;  Location: [facility] ENDOSCOPY;  Service: Cardiovascular;  Laterality: N/A;  Rm 2034  . Cardioversion N/A 07/22/2012    Procedure: CARDIOVERSION;  Surgeon: [name], MD;  Location: [facility] ENDOSCOPY;  Service: Cardiovascular;  Laterality: N/A;  . Knee surgery Left 1980's    "after 2 scopes they went in and did some kind of OR" (11/12/2012)  . Tubal ligation  1980's  . Cardiac valve replacement  2006  St. Jude AVR  . Cardioversion N/A 11/25/2012    Procedure: CARDIOVERSION;  Surgeon: [name], MD;  Location: [place];  Service: Cardiovascular;  Laterality: N/A;  . Cardioversion N/A 12/30/2012    Procedure: CARDIOVERSION;  Surgeon: [name], MD;  Location: [facility] ENDOSCOPY;  Service: Cardiovascular;  Laterality: N/A;  h&p in file-HW   . Cardioversion N/A 03/30/2013    Procedure: CARDIOVERSION;  Surgeon: [name], MD;  Location: [facility] ENDOSCOPY;  Service: Cardiovascular;  Laterality: N/A;  . Tee without cardioversion N/A 10/07/2013    Procedure: TRANSESOPHAGEAL ECHOCARDIOGRAM (TEE);  Surgeon: [name], MD;  Location: [facility] ENDOSCOPY;  Service: Cardiovascular;  Laterality: N/A;     Current Outpatient Prescriptions  Medication Sig Dispense Refill  . Ascorbic Acid (VITAMIN C)  1000 MG tablet Take 2,000 mg by mouth daily.    Marland Kitchen buPROPion (WELLBUTRIN XL) 150 MG 24 hr tablet Take 150 mg by mouth daily.    . calcium-vitamin D (OSCAL WITH D) 500-200 MG-UNIT per tablet Take 2 tablets by mouth daily.     . Cholecalciferol (VITAMIN D) 2000 UNITS tablet Take 4,000 Units by mouth daily.    Marland Kitchen diltiazem (TIAZAC) 240 MG 24 hr capsule Take 240 mg by mouth daily.    Marland Kitchen dofetilide (TIKOSYN) 250 MCG capsule Take 250 mcg by mouth 2 (two) times daily.    . furosemide (LASIX) 80 MG tablet TAKE 1 TABLET BY MOUTH EVERY DAY 90 tablet 0  . HYDROcodone-acetaminophen (NORCO/VICODIN) 5-325 MG per tablet Take 1 tablet by mouth every 6 (six) hours as needed for moderate pain.   0  . losartan (COZAAR) 50 MG tablet TAKE 1 TABLET BY MOUTH DAILY 90 tablet 0  . Multiple Vitamin (MULTIVITAMIN WITH MINERALS) TABS Take 1 tablet by mouth daily.    . potassium chloride SA (K-DUR,KLOR-CON) 20 MEQ tablet Take (2) 20 meq tablets in the morning and (1) 20 meq tablet at night. 180 tablet 0  . sertraline (ZOLOFT) 100 MG tablet Take 150 mg by mouth at bedtime.    . tretinoin (RETIN-A) 0.05 % cream Apply 1 application topically at bedtime.     Marland Kitchen warfarin (COUMADIN) 4 MG tablet Take as directed by [facility] 120 tablet 0  . zolpidem (AMBIEN) 10 MG tablet Take 5 mg by mouth at bedtime as needed for sleep.     No current facility-administered medications for this visit.    Allergies:   Codeine; Beta adrenergic blockers; Metoprolol; Propoxyphene; Toprol xl; and Dilantin    Social History:  The patient  reports that she has never smoked. She has never used smokeless tobacco. She reports that she drinks alcohol. She reports that she does not use illicit drugs.   Family History:  The patient's family history includes Heart disease in her brother; Hyperlipidemia in her father; Hypertension in her father and mother; Lung cancer in her mother.    ROS:  Please see the history of present illness.   Otherwise, review  of systems are positive for none.   All other systems are reviewed and negative.    PHYSICAL EXAM: VS:  BP 150/86 mmHg  Pulse 83  Ht 5\' 7"  (1.702 m)  Wt 227 lb 12.8 oz (103.329 kg)  BMI 35.67 kg/m2  SpO2 94% , BMI Body mass index is 35.67 kg/(m^2). GEN: Well nourished, well developed, in no acute distress HEENT: normal Neck: no JVD, carotid bruits, or masses Cardiac: RRR; no murmurs, rubs, or gallops,no edema  Respiratory:  clear to auscultation  bilaterally, normal work of breathing GI: soft, nontender, nondistended, + BS MS: no deformity or atrophy Skin: warm and dry, no rash Neuro:  Strength and sensation are intact Psych: euthymic mood, full affect   EKG:  EKG is ordered today. The ekg ordered today demonstrates NSR with PAC's and QTc 442msec with nonspecific ST abnormality   Recent Labs: 10/02/2013: Hemoglobin 12.5; Platelets 247.0 03/26/2014: Magnesium 2.1 04/06/2014: BUN 16; Creatinine 0.7; Potassium 4.1; Sodium 137    Lipid Panel No results found for: CHOL, TRIG, HDL, CHOLHDL, VLDL, LDLCALC, LDLDIRECT    Wt Readings from Last 3 Encounters:  06/01/14 227 lb 12.8 oz (103.329 kg)  03/26/14 231 lb 12.8 oz (105.144 kg)  02/21/14 220 lb (99.791 kg)     ASSESSMENT/PLAN:  1. Atrial fibrillation s/p afib ablation at [facility] - maintaining NSR with QTc 456msec - continue Tikosyn/warfarin/diltiazem  - check BMET and Mg today 2. Chronic systemic anticoagulation  3. OSA now on  CPAP and tolerating well.  Her d/l today showed an AHI of 1/hr on 12cm H2O and 70% compliance in using more than 4 hours nightly.  Average nightly usage is 5 hours and 17 minutes.   4. Obesity  5. AS s/p mechanical AVR on chronic systemic anticoagulation 6. Chronic diastolic CHF - compensated  - continue Lasix  - check BMET/Mg 7. HTN borderline controlled  - continue losartan/diltiazem  8.  Chest pain that is vague in description and nonexertional but is also having more SOB.  I will get a nuclear  stress test to rule out ischemia since she is going to have knee surgery and also get an echo to assess LVF.       Current medicines are reviewed at length with the patient today.  The patient does not have concerns regarding medicines.  The following changes have been made:  no change  Labs/ tests ordered today include: BMET, MG, Lexiscan myoview, 2D echo     Disposition:   FU with me in 6 months   Signed, [name], MD  06/01/2014 11:16 AM    [facility] [address], [place], [place]  [address] Phone: [phone]; Fax: [phone]

## 2014-06-08 ENCOUNTER — Encounter: Payer: Self-pay | Admitting: Cardiology

## 2014-06-17 ENCOUNTER — Ambulatory Visit (HOSPITAL_BASED_OUTPATIENT_CLINIC_OR_DEPARTMENT_OTHER): Payer: Medicare Other | Admitting: Radiology

## 2014-06-17 ENCOUNTER — Ambulatory Visit (HOSPITAL_COMMUNITY): Payer: Medicare Other | Attending: Cardiology | Admitting: Radiology

## 2014-06-17 DIAGNOSIS — I10 Essential (primary) hypertension: Secondary | ICD-10-CM | POA: Diagnosis not present

## 2014-06-17 DIAGNOSIS — R079 Chest pain, unspecified: Secondary | ICD-10-CM | POA: Diagnosis not present

## 2014-06-17 DIAGNOSIS — R0602 Shortness of breath: Secondary | ICD-10-CM

## 2014-06-17 DIAGNOSIS — R9431 Abnormal electrocardiogram [ECG] [EKG]: Secondary | ICD-10-CM | POA: Insufficient documentation

## 2014-06-17 MED ORDER — REGADENOSON 0.4 MG/5ML IV SOLN
0.4000 mg | Freq: Once | INTRAVENOUS | Status: AC
  Administered 2014-06-17: 0.4 mg via INTRAVENOUS

## 2014-06-17 MED ORDER — TECHNETIUM TC 99M SESTAMIBI GENERIC - CARDIOLITE
33.0000 | Freq: Once | INTRAVENOUS | Status: AC | PRN
  Administered 2014-06-17: 33 via INTRAVENOUS

## 2014-06-17 NOTE — Progress Notes (Addendum)
[facility] [address] [place], [place] [address] [phone]    Cardiology Nuclear Med Study  LASANDRA BATLEY is a 69 y.o. female     MRN : [id number]     DOB: 03-30-1946  Procedure Date: 06/17/2014  Nuclear Med Background Indication for Stress Test:  Evaluation for Ischemia,  Abnormal EKG, and Pending Surgical Clearance in the future for  (L) TKR  History:  MPI: 3 yrs ago per pt ok, AFIB, CHF Cardiac Risk Factors: Hypertension  Symptoms:  Chest Pain and SOB   Nuclear Pre-Procedure Caffeine/Decaff Intake:  None> 12 hrs NPO After: 7:00pm   Lungs:  clear O2 Sat: 96% on room air. IV 0.9% NS with Angio Cath:  22g  IV Site: R Wrist x 1, tolerated well IV Started by:  Irven Baltimore, RN  Chest Size (in):  48 Cup Size: C  Height: 5\' 7"  (1.702 m)  Weight:  228 lb (103.42 kg)  BMI:  Body mass index is 35.7 kg/(m^2). Tech Comments:  N/A    Nuclear Med Study 1 or 2 day study: 2 day  Stress Test Type:  Lexiscan  Reading MD: N/A  Order Authorizing Provider:  Fransico Him, MD  Resting Radionuclide: Technetium 21m Sestamibi  Resting Radionuclide Dose: 30 mCi   Stress Radionuclide:  Technetium 66m Sestamibi  Stress Radionuclide Dose: 30 mCi           Stress Protocol Rest HR: 72 Stress HR:  Rest BP: 118/60 Stress BP:   Exercise Time (min): n/a METS: n/a    Dose of Adenosine (mg):  n/a Dose of Lexiscan: 0.4 mg  Dose of Atropine (mg): n/a Dose of Dobutamine: n/a mcg/kg/min (at max HR)  Stress Test Technologist: Perrin Maltese, EMT-P  Nuclear Technologist:  Evalina Field, RT-N     Rest Procedure:  Myocardial perfusion imaging was performed at rest 45 minutes following the intravenous administration of Technetium 75m Sestamibi. Rest ECG:   Stress Procedure:  The patient received IV Lexiscan 0.4 mg over 15-seconds.  Technetium 68m Sestamibi injected at 30-seconds. This patient  Felt funny and was lt. Headed with the Lexiscan injection. Quantitative  spect images were obtained after a 45 minute delay. Stress ECG:   QPS Raw Data Images:   Stress Images:   Rest Images:   Subtraction (SDS):   Transient Ischemic Dilatation (Normal <1.22):   Lung/Heart Ratio (Normal <0.45):    Quantitative Gated Spect Images QGS EDV:   QGS ESV:    Impression Exercise Capacity:   BP Response:   Clinical Symptoms:   ECG Impression:   Comparison with Prior Nuclear Study: No images to compare  Overall Impression:    LV Ejection Fraction: .  LV Wall Motion:

## 2014-06-17 NOTE — Progress Notes (Signed)
Echocardiogram performed.  

## 2014-06-18 ENCOUNTER — Telehealth: Payer: Self-pay

## 2014-06-18 DIAGNOSIS — I4891 Unspecified atrial fibrillation: Secondary | ICD-10-CM | POA: Diagnosis not present

## 2014-06-18 DIAGNOSIS — I77810 Thoracic aortic ectasia: Secondary | ICD-10-CM

## 2014-06-18 DIAGNOSIS — I10 Essential (primary) hypertension: Secondary | ICD-10-CM | POA: Diagnosis not present

## 2014-06-18 NOTE — Telephone Encounter (Signed)
Informed patient of results and verbal understanding expressed.  Repeat ECHO ordered to be scheduled in 1 year. Patient agrees with treatment plan. 

## 2014-06-18 NOTE — Telephone Encounter (Signed)
-----   Message from [name], MD sent at 06/17/2014 12:47 PM EST ----- Please let patient know that echo showed normal LVF with stabkle AVR, mildly dilated aortic root, mildly calcified MV with mild mR - repeat echo in 1 year to follow dilated aortic root

## 2014-06-22 ENCOUNTER — Ambulatory Visit (HOSPITAL_COMMUNITY): Payer: Medicare Other | Attending: Cardiovascular Disease | Admitting: Radiology

## 2014-06-22 ENCOUNTER — Ambulatory Visit (INDEPENDENT_AMBULATORY_CARE_PROVIDER_SITE_OTHER): Payer: Medicare Other | Admitting: Pharmacist Clinician (PhC)/ Clinical Pharmacy Specialist

## 2014-06-22 VITALS — Ht 67.0 in | Wt 228.0 lb

## 2014-06-22 DIAGNOSIS — I480 Paroxysmal atrial fibrillation: Secondary | ICD-10-CM

## 2014-06-22 DIAGNOSIS — R079 Chest pain, unspecified: Secondary | ICD-10-CM | POA: Diagnosis not present

## 2014-06-22 DIAGNOSIS — Z5181 Encounter for therapeutic drug level monitoring: Secondary | ICD-10-CM | POA: Diagnosis not present

## 2014-06-22 DIAGNOSIS — I359 Nonrheumatic aortic valve disorder, unspecified: Secondary | ICD-10-CM

## 2014-06-22 DIAGNOSIS — R0989 Other specified symptoms and signs involving the circulatory and respiratory systems: Secondary | ICD-10-CM

## 2014-06-22 DIAGNOSIS — I4891 Unspecified atrial fibrillation: Secondary | ICD-10-CM

## 2014-06-22 LAB — POCT INR: INR: 1.8

## 2014-06-22 MED ORDER — TECHNETIUM TC 99M SESTAMIBI GENERIC - CARDIOLITE
33.0000 | Freq: Once | INTRAVENOUS | Status: AC | PRN
  Administered 2014-06-22: 33 via INTRAVENOUS

## 2014-06-22 NOTE — Progress Notes (Signed)
[facility] [place], [place] [address] [phone]    Cardiology Nuclear Med Study  [name] is a 69 y.o. female     MRN : [id number]     DOB: 1945-06-24  Procedure Date: 06/22/2014  Nuclear Med Background Indication for Stress Test:  Evaluation for Ischemia, Surgical Clearance for pending (L) TKR and Abnormal EKG History:  MPI~75yrs ago (nml per pt); AFIB; CHF Cardiac Risk Factors: Hypertension  Symptoms:  Chest Pain and SOB   Nuclear Pre-Procedure Caffeine/Decaff Intake:  None NPO After: 7:00pm   Lungs:  clear O2 Sat: 96% on room air. IV 0.9% NS with Angio Cath:  22g  IV Site: R Wrist  IV Started by:  Irven Baltimore, RN  Chest Size (in):  48 Cup Size: C  Height: 5\' 7"  (1.702 m)  Weight:  228 lb (103.42 kg)  BMI:  Body mass index is 35.7 kg/(m^2). Tech Comments:  n/a    Nuclear Med Study 1 or 2 day study: 2 day  Stress Test Type:  Lexiscan  Reading MD: n/a  Order Authorizing Provider:  T. Radford Pax, MD  Resting Radionuclide: Technetium 38m Sestamibi  Resting Radionuclide Dose: 33.0 mCi on 06/22/14   Stress Radionuclide:  Technetium 70m Sestamibi  Stress Radionuclide Dose: 33.0 mCi on 06/17/14           Stress Protocol Rest HR: 72 Stress HR: 84  Rest BP: 118/60 Stress BP: 134/50  Exercise Time (min): n/a METS: n/a           Dose of Adenosine (mg):  n/a Dose of Lexiscan: 0.4 mg  Dose of Atropine (mg): n/a Dose of Dobutamine: n/a mcg/kg/min (at max HR)  Stress Test Technologist: Perrin Maltese, EMT-P  Nuclear Technologist:  Earl Many, CNMT     Rest Procedure:  Myocardial perfusion imaging was performed at rest 45 minutes following the intravenous administration of Technetium 53m Sestamibi. Rest ECG: NSR with non-specific ST-T wave changes  Stress Procedure:  The patient received IV Lexiscan 0.4 mg over 15-seconds.  Technetium 83m Sestamibi injected at 30-seconds.  The patient experienced light headedness and  felt funny with the [place].  Quantitative spect images were obtained after a 45 minute delay. Stress ECG: No significant change from baseline ECG  QPS Raw Data Images:  There is no interference from nuclear activity from structures below the diaphragm.   Stress Images:  There is decreased uptake in the apex. Rest Images:  There is decreased uptake in the apex. Subtraction (SDS):  3 Transient Ischemic Dilatation (Normal <1.22):  0.90 Lung/Heart Ratio (Normal <0.45):  0.36  Quantitative Gated Spect Images QGS EDV:  143 ml QGS ESV:  54 ml  Impression Exercise Capacity:  Lexiscan with no exercise. BP Response:  Normal blood pressure response. Clinical Symptoms:  No significant symptoms noted. ECG Impression:  There are scattered PVCs. Comparison with Prior Nuclear Study: No previous nuclear study performed  Overall Impression:  Low risk stress nuclear study with small size, moderate-intensity mostly fixed anteroapical and apical defect which likely represents shifting breast attenuation artifact.  LV Ejection Fraction: 62%.  LV Wall Motion:  Normal Wall Motion   Pixie Casino, MD, [facility] Board Certified in Nuclear Cardiology Attending Cardiologist [place]

## 2014-06-26 ENCOUNTER — Other Ambulatory Visit: Payer: Self-pay | Admitting: Cardiology

## 2014-06-27 ENCOUNTER — Other Ambulatory Visit: Payer: Self-pay | Admitting: Cardiology

## 2014-06-28 ENCOUNTER — Other Ambulatory Visit: Payer: Self-pay

## 2014-06-28 DIAGNOSIS — Z1231 Encounter for screening mammogram for malignant neoplasm of breast: Secondary | ICD-10-CM

## 2014-06-29 ENCOUNTER — Telehealth: Payer: Self-pay | Admitting: Cardiology

## 2014-06-29 MED ORDER — POTASSIUM CHLORIDE CRYS ER 20 MEQ PO TBCR: 40.0000 meq | EXTENDED_RELEASE_TABLET | ORAL | Status: DC

## 2014-06-29 NOTE — Telephone Encounter (Signed)
New Msg       Pt c/o medication issue:  1. Name of Medication: *Potassium  2. How are you currently taking this medication (dosage and times per day)? Three times a day   3. Are you having a reaction (difficulty breathing--STAT)? no  4. What is your medication issue? Pt prescription is for 2 per day, pt needs new prescription for 3 a day.   Please send new prescription to [facility] on [address].  Please call pt if any concerns.

## 2014-06-29 NOTE — Telephone Encounter (Signed)
Verified dose of Kdur with patient.  Rx sent to pharmacy requested.

## 2014-07-10 IMAGING — CR DG CHEST 2V
2 series · 2 of 2 positions shown · non-contrast
Comparison: 07/18/2012

CLINICAL DATA: Atrial fibrillation

CHEST - 2 VIEW

[w chest pa]
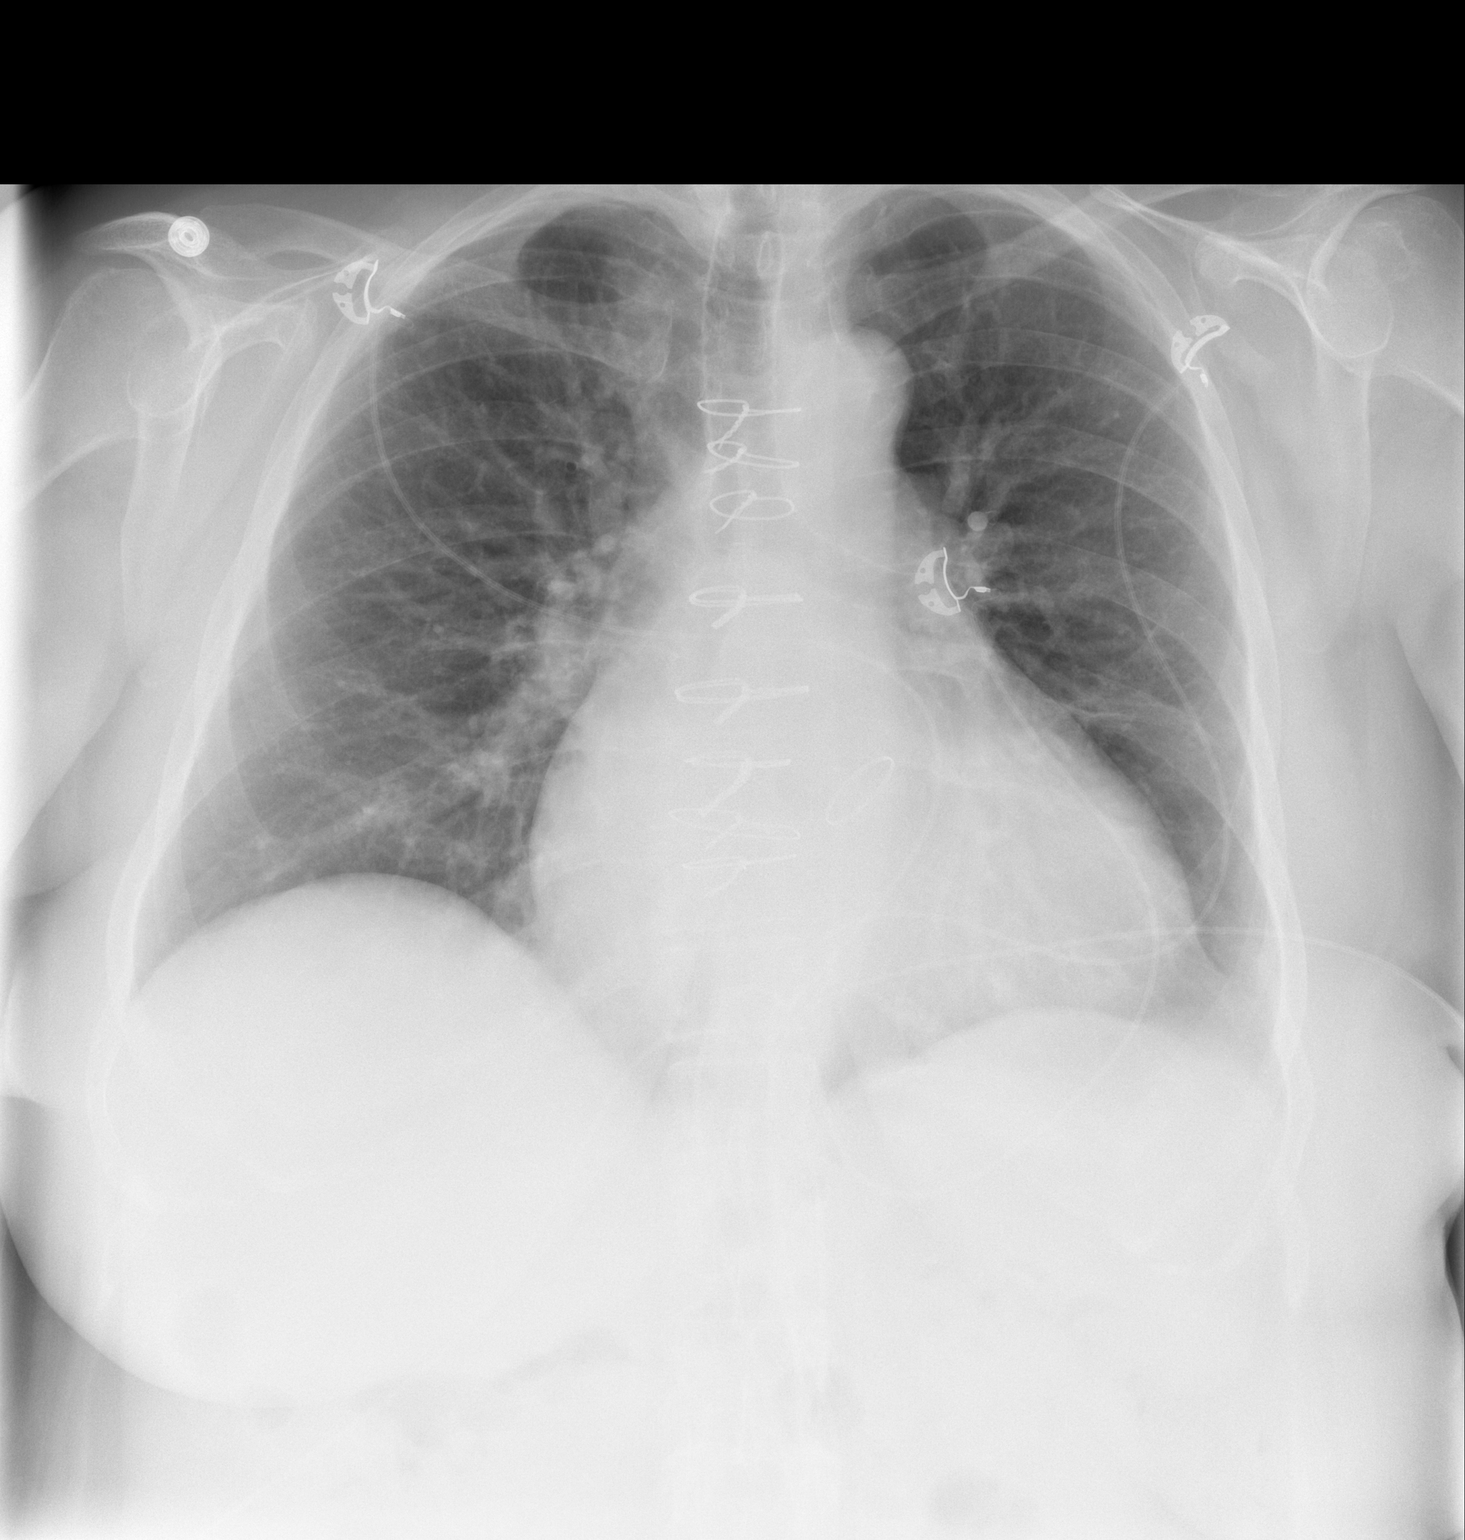

[w chest lat]
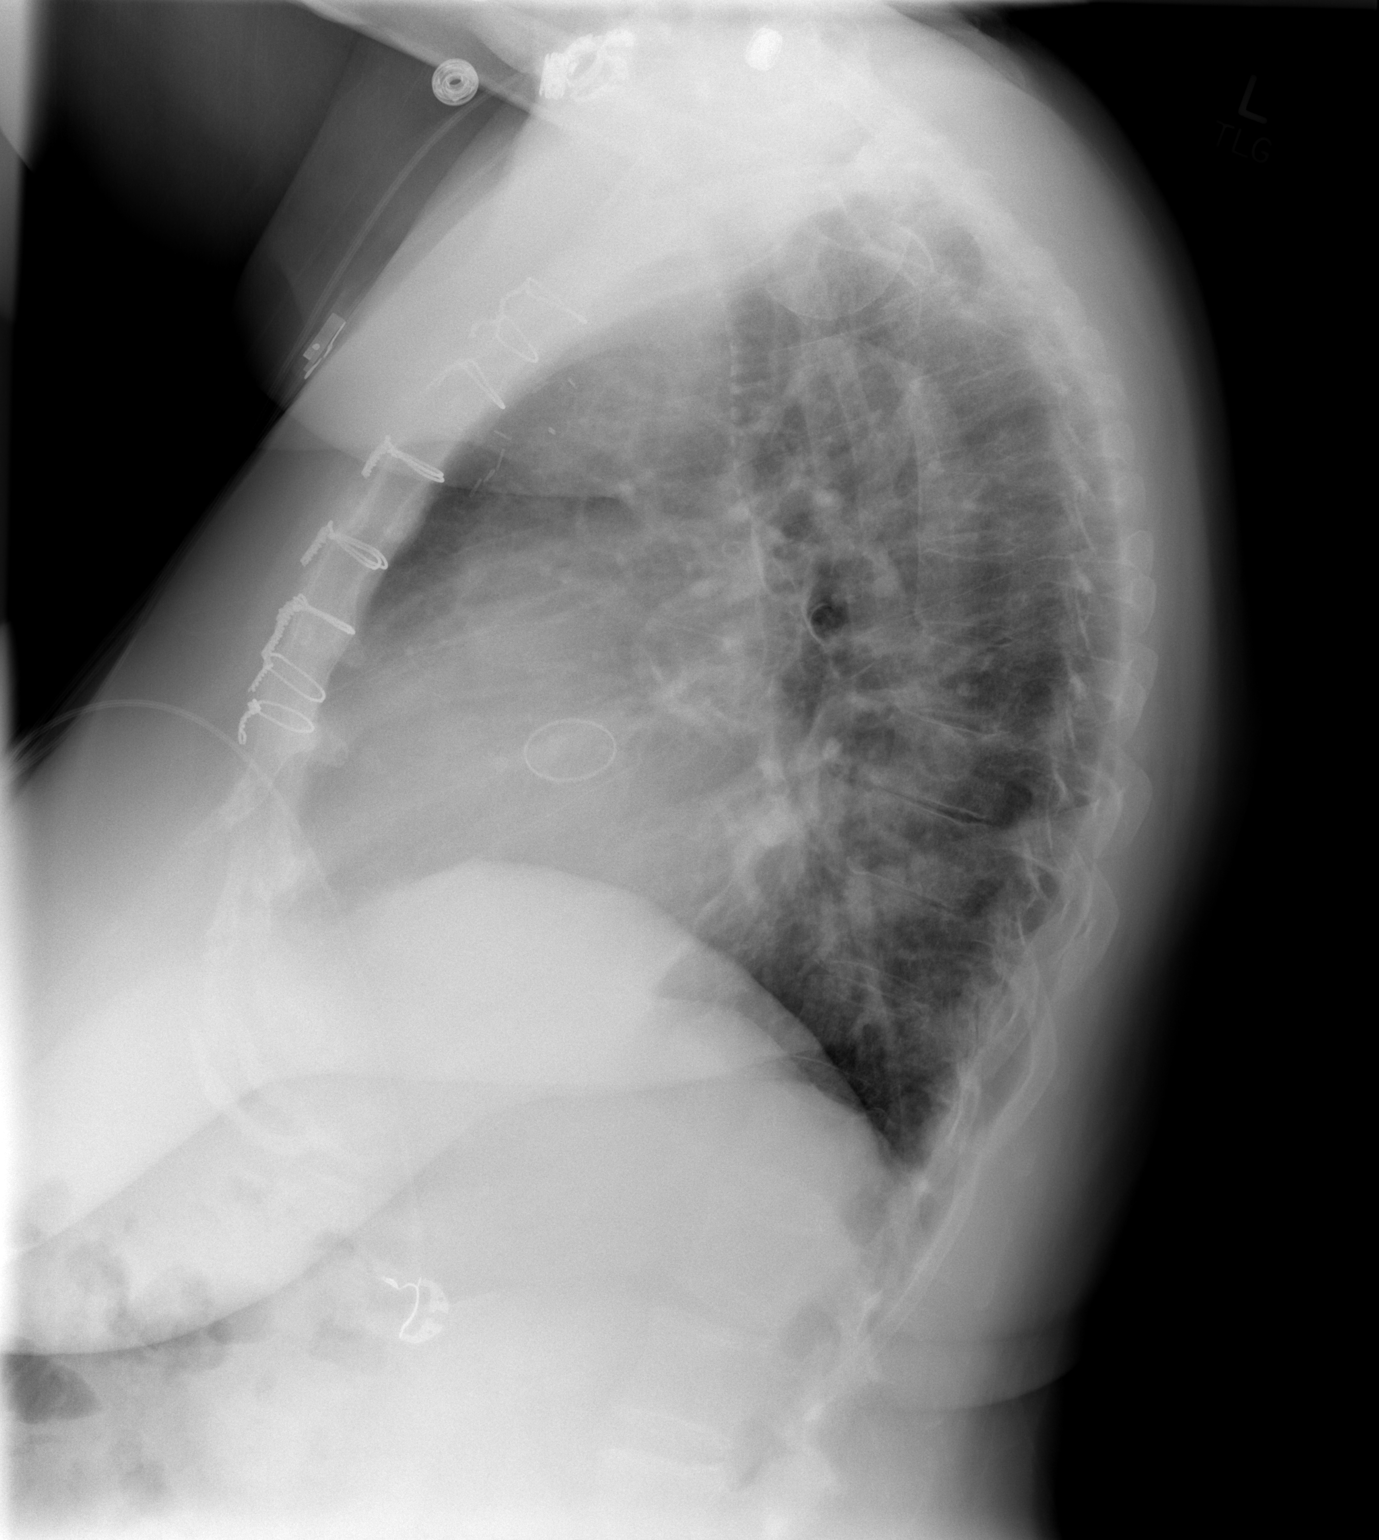

[2 of 2 positions shown; findings below may reference images not displayed]

FINDINGS: Cardiac shadow is enlarged.  Postsurgical changes are
again seen.  The lungs are well aerated without focal infiltrate or
sizable effusion.  Some scarring is noted in the left lung base.
IMPRESSION: No acute abnormality noted.

## 2014-07-13 ENCOUNTER — Ambulatory Visit (INDEPENDENT_AMBULATORY_CARE_PROVIDER_SITE_OTHER): Payer: Medicare Other | Admitting: *Deleted

## 2014-07-13 DIAGNOSIS — I359 Nonrheumatic aortic valve disorder, unspecified: Secondary | ICD-10-CM | POA: Diagnosis not present

## 2014-07-13 DIAGNOSIS — I480 Paroxysmal atrial fibrillation: Secondary | ICD-10-CM | POA: Diagnosis not present

## 2014-07-13 DIAGNOSIS — I4891 Unspecified atrial fibrillation: Secondary | ICD-10-CM

## 2014-07-13 DIAGNOSIS — Z5181 Encounter for therapeutic drug level monitoring: Secondary | ICD-10-CM

## 2014-07-13 LAB — POCT INR: INR: 1.8

## 2014-07-16 ENCOUNTER — Ambulatory Visit: Payer: Medicare Other

## 2014-07-23 DIAGNOSIS — L814 Other melanin hyperpigmentation: Secondary | ICD-10-CM | POA: Diagnosis not present

## 2014-07-23 DIAGNOSIS — Z85828 Personal history of other malignant neoplasm of skin: Secondary | ICD-10-CM | POA: Diagnosis not present

## 2014-07-23 DIAGNOSIS — L821 Other seborrheic keratosis: Secondary | ICD-10-CM | POA: Diagnosis not present

## 2014-07-23 DIAGNOSIS — L817 Pigmented purpuric dermatosis: Secondary | ICD-10-CM | POA: Diagnosis not present

## 2014-07-23 DIAGNOSIS — D1801 Hemangioma of skin and subcutaneous tissue: Secondary | ICD-10-CM | POA: Diagnosis not present

## 2014-07-23 DIAGNOSIS — L820 Inflamed seborrheic keratosis: Secondary | ICD-10-CM | POA: Diagnosis not present

## 2014-08-03 ENCOUNTER — Ambulatory Visit (INDEPENDENT_AMBULATORY_CARE_PROVIDER_SITE_OTHER): Payer: Medicare Other

## 2014-08-03 DIAGNOSIS — I4891 Unspecified atrial fibrillation: Secondary | ICD-10-CM

## 2014-08-03 DIAGNOSIS — Z5181 Encounter for therapeutic drug level monitoring: Secondary | ICD-10-CM | POA: Diagnosis not present

## 2014-08-03 DIAGNOSIS — I359 Nonrheumatic aortic valve disorder, unspecified: Secondary | ICD-10-CM

## 2014-08-03 LAB — POCT INR: INR: 2

## 2014-08-20 ENCOUNTER — Ambulatory Visit
Admission: RE | Admit: 2014-08-20 | Discharge: 2014-08-20 | Disposition: A | Discharge status: Home / Self Care | Payer: Medicare Other | Source: Ambulatory Visit

## 2014-08-20 DIAGNOSIS — Z1231 Encounter for screening mammogram for malignant neoplasm of breast: Secondary | ICD-10-CM

## 2014-08-24 ENCOUNTER — Ambulatory Visit (INDEPENDENT_AMBULATORY_CARE_PROVIDER_SITE_OTHER): Payer: Medicare Other | Admitting: Pharmacist Clinician (PhC)/ Clinical Pharmacy Specialist

## 2014-08-24 DIAGNOSIS — Z5181 Encounter for therapeutic drug level monitoring: Secondary | ICD-10-CM | POA: Diagnosis not present

## 2014-08-24 DIAGNOSIS — I359 Nonrheumatic aortic valve disorder, unspecified: Secondary | ICD-10-CM | POA: Diagnosis not present

## 2014-08-24 DIAGNOSIS — I4891 Unspecified atrial fibrillation: Secondary | ICD-10-CM

## 2014-08-24 LAB — POCT INR: INR: 1.8

## 2014-08-25 ENCOUNTER — Other Ambulatory Visit: Payer: Self-pay | Admitting: Cardiology

## 2014-08-25 ENCOUNTER — Telehealth: Payer: Self-pay

## 2014-08-25 NOTE — Telephone Encounter (Signed)
Confirmed with patient she is taking 100 mg Losartan daily. Patient also st her MD in [facility] instructed her to have an ECHO here if her HR is irregular.  Per Dr. Radford Pax, patient can call to schedule ECHO if necessary. Patient agrees with treatment plan and med list updated.

## 2014-09-15 ENCOUNTER — Encounter: Payer: Self-pay | Admitting: Cardiology

## 2014-09-16 DIAGNOSIS — E785 Hyperlipidemia, unspecified: Secondary | ICD-10-CM | POA: Diagnosis not present

## 2014-09-16 DIAGNOSIS — G4700 Insomnia, unspecified: Secondary | ICD-10-CM | POA: Diagnosis not present

## 2014-09-16 DIAGNOSIS — F330 Major depressive disorder, recurrent, mild: Secondary | ICD-10-CM | POA: Diagnosis not present

## 2014-09-16 DIAGNOSIS — I10 Essential (primary) hypertension: Secondary | ICD-10-CM | POA: Diagnosis not present

## 2014-09-16 DIAGNOSIS — G4733 Obstructive sleep apnea (adult) (pediatric): Secondary | ICD-10-CM | POA: Diagnosis not present

## 2014-09-16 DIAGNOSIS — Z0000 Encounter for general adult medical examination without abnormal findings: Secondary | ICD-10-CM | POA: Diagnosis not present

## 2014-09-16 DIAGNOSIS — Z131 Encounter for screening for diabetes mellitus: Secondary | ICD-10-CM | POA: Diagnosis not present

## 2014-09-17 ENCOUNTER — Ambulatory Visit (INDEPENDENT_AMBULATORY_CARE_PROVIDER_SITE_OTHER): Payer: Medicare Other | Admitting: *Deleted

## 2014-09-17 DIAGNOSIS — I4891 Unspecified atrial fibrillation: Secondary | ICD-10-CM | POA: Diagnosis not present

## 2014-09-17 DIAGNOSIS — I359 Nonrheumatic aortic valve disorder, unspecified: Secondary | ICD-10-CM | POA: Diagnosis not present

## 2014-09-17 DIAGNOSIS — Z5181 Encounter for therapeutic drug level monitoring: Secondary | ICD-10-CM | POA: Diagnosis not present

## 2014-09-17 LAB — POCT INR: INR: 2

## 2014-09-21 DIAGNOSIS — M1712 Unilateral primary osteoarthritis, left knee: Secondary | ICD-10-CM | POA: Diagnosis not present

## 2014-10-15 ENCOUNTER — Ambulatory Visit (INDEPENDENT_AMBULATORY_CARE_PROVIDER_SITE_OTHER): Payer: Medicare Other | Admitting: *Deleted

## 2014-10-15 DIAGNOSIS — Z5181 Encounter for therapeutic drug level monitoring: Secondary | ICD-10-CM

## 2014-10-15 DIAGNOSIS — I4891 Unspecified atrial fibrillation: Secondary | ICD-10-CM | POA: Diagnosis not present

## 2014-10-15 DIAGNOSIS — I359 Nonrheumatic aortic valve disorder, unspecified: Secondary | ICD-10-CM | POA: Diagnosis not present

## 2014-10-15 LAB — POCT INR: INR: 3.4

## 2014-10-21 ENCOUNTER — Other Ambulatory Visit: Payer: Self-pay | Admitting: Orthopedic Surgery

## 2014-10-22 ENCOUNTER — Telehealth: Payer: Self-pay | Admitting: Pharmacist

## 2014-10-22 NOTE — Telephone Encounter (Signed)
Received fax from Dr. Dorna Leitz for pt to have a L total knee replacement.  Given her history of afib, mechanical AVR, and subdural hematoma, Dr. Radford Pax would like pt to be admitted for a heparin bridge when she has to come off Coumadin.  Faxed information to Dr. Berenice Primas office.  Spoke with pt and gave her this information.  She stated Dr. Berenice Primas may try to do the procedure while she is on Coumadin.  I am unsure that this is the case given the invasive nature of a TKR.  She has an appt with him within the next few weeks and will discuss at that time.

## 2014-10-25 NOTE — Pre-Procedure Instructions (Signed)
TEANA LINDAHL  10/25/2014     Your procedure is scheduled on Friday, July 29.  Report to [facility] Admitting at 5:30 AM                Your surgery is scheduled for 7:30AM   Call this number if you have problems the morning of surgery: [phone]                For any other questions, please call [phone], Monday - Friday 8 AM - 4 PM.   Remember:  Do not eat food or drink liquids after midnight Thursday, July 28.  Take these medicines the morning of surgery with A SIP OF WATER : buPROPion (WELLBUTRIN XL), diltiazem (TIAZAC), dofetilide (TIKOSYN).                  Take if neeed:HYDROcodone-acetaminophen (NORCO/VICODIN).                  Stop taking Aspirin, Asririn products, Ibuprofen (Advil), Naproxen (Aleve), Vitamins and Herbal medications on July 22.                   Stop taking Comadin as directed by Dr Berenice Primas.   Do not wear jewelry, make-up or nail polish.  Do not wear lotions, powders, or perfumes.  Do not shave 48 hours prior to surgery.   Do not bring valuables to the hospital.  [facility] is not responsible for any belongings or valuables.  Contacts, dentures or bridgework may not be worn into surgery.  Leave your suitcase in the car.  After surgery it may be brought to your room.  For patients admitted to the hospital, discharge time will be determined by your treatment team.  Patients discharged the day of surgery will not be allowed to drive home.   Name and phone number of your driver:   -     Special instructions:  - Preparing for Surgery  Before surgery, you can play an important role.  Because skin is not sterile, your skin needs to be as free of germs as possible.  You can reduce the number of germs on you skin by washing with CHG (chlorahexidine gluconate) soap before surgery.  CHG is an antiseptic cleaner which kills germs and bonds with the skin to continue killing germs even after washing.  Please DO NOT use if you  have an allergy to CHG or antibacterial soaps.  If your skin becomes reddened/irritated stop using the CHG and inform your nurse when you arrive at Short Stay.  Do not shave (including legs and underarms) for at least 48 hours prior to the first CHG shower.  You may shave your face.  Please follow these instructions carefully:   1.  Shower with CHG Soap the night before surgery and the morning of surgery  2.  If you choose to wash your hair, wash your hair first as usual with your  normal shampoo.  3.  After you shampoo, rinse your hair and body thoroughly to remove the  shampoo.  4.  Use CHG as you would any other liquid soap.  You can apply chg directly  to the skin and wash gently with scrungie or a clean washcloth.  5.  Apply the CHG Soap to your body ONLY FROM THE NECK DOWN.  Do  not use on open wounds or open sores.  Avoid contact with your eyes,ears, mouth and genitals (private parts).  Wash genitals (private parts)       with your normal soap.  6.  Wash thoroughly, paying special attention to the area where your surgery  will be performed.  7.  Thoroughly rinse your body with warm water from the neck down.  8.  DO NOT shower/wash with your normal soap after using and rinsing off the CHG soap  9.  Pat yourself dry with a clean towel.            10.  Wear clean pajamas.            11.  Place clean sheets on your bed the night of your first shower and do not sleep with pets.  Day of Surgery  Do not apply any lotions/deoderants the morning of surgery.  Please wear clean clothes to the hospital/surgery center.   Please read over the following fact sheets that you were given. Pain Booklet, Coughing and Deep Breathing, Blood Transfusion Information and Surgical Site Infection Prevention and Incentive  Spirometry.

## 2014-10-26 ENCOUNTER — Encounter (HOSPITAL_COMMUNITY)
Admission: RE | Admit: 2014-10-26 | Discharge: 2014-10-26 | Disposition: A | Discharge status: Home / Self Care | Payer: Medicare Other | Source: Ambulatory Visit | Attending: Orthopedic Surgery | Admitting: Orthopedic Surgery

## 2014-10-26 ENCOUNTER — Encounter (HOSPITAL_COMMUNITY): Payer: Self-pay

## 2014-10-26 DIAGNOSIS — Z01818 Encounter for other preprocedural examination: Secondary | ICD-10-CM | POA: Diagnosis not present

## 2014-10-26 DIAGNOSIS — Z01811 Encounter for preprocedural respiratory examination: Secondary | ICD-10-CM | POA: Diagnosis not present

## 2014-10-26 DIAGNOSIS — M1712 Unilateral primary osteoarthritis, left knee: Secondary | ICD-10-CM | POA: Diagnosis not present

## 2014-10-26 HISTORY — DX: Unilateral primary osteoarthritis, unspecified knee: M17.10

## 2014-10-26 HISTORY — DX: Osteoarthritis of knee, unspecified: M17.9

## 2014-10-26 LAB — CBC WITH DIFFERENTIAL/PLATELET
BASOS ABS: 0.1 10*3/uL (ref 0.0–0.1)
Basophils Relative: 2 % — ABNORMAL HIGH (ref 0–1)
EOS PCT: 3 % (ref 0–5)
Eosinophils Absolute: 0.2 10*3/uL (ref 0.0–0.7)
HEMATOCRIT: 38.6 % (ref 36.0–46.0)
Hemoglobin: 13.4 g/dL (ref 12.0–15.0)
Lymphocytes Relative: 24 % (ref 12–46)
Lymphs Abs: 1.6 10*3/uL (ref 0.7–4.0)
MCH: 31.4 pg (ref 26.0–34.0)
MCHC: 34.7 g/dL (ref 30.0–36.0)
MCV: 90.4 fL (ref 78.0–100.0)
MONO ABS: 0.7 10*3/uL (ref 0.1–1.0)
Monocytes Relative: 11 % (ref 3–12)
Neutro Abs: 4.1 10*3/uL (ref 1.7–7.7)
Neutrophils Relative %: 60 % (ref 43–77)
PLATELETS: 285 10*3/uL (ref 150–400)
RBC: 4.27 MIL/uL (ref 3.87–5.11)
RDW: 13.4 % (ref 11.5–15.5)
WBC: 6.7 10*3/uL (ref 4.0–10.5)

## 2014-10-26 LAB — TYPE AND SCREEN
ABO/RH(D): O NEG
ANTIBODY SCREEN: NEGATIVE

## 2014-10-26 LAB — SURGICAL PCR SCREEN
MRSA, PCR: NEGATIVE
STAPHYLOCOCCUS AUREUS: NEGATIVE

## 2014-10-26 LAB — ABO/RH: ABO/RH(D): O NEG

## 2014-10-26 NOTE — Progress Notes (Signed)
Patient has an appointment with Dr Erlinda Hong this morning. Saw Dr Radford Pax, sho has not given cardiac clearance due to Warfarin use. State will discuss with Dr Erlinda Hong today and Call me back re: instructions for Warfarin use. And Cardiac Clearance.

## 2014-10-26 NOTE — Progress Notes (Signed)
   10/26/14 0849  OBSTRUCTIVE SLEEP APNEA  Have you ever been diagnosed with sleep apnea through a sleep study? Yes (done @ [facility] 2015 ; to reduce settings )  If yes, do you have and use a CPAP or BPAP machine every night? 1  Do you know the presssure settings on your maching? No

## 2014-10-27 NOTE — Progress Notes (Addendum)
Anesthesia Chart Review: Patient is a 69 year old female scheduled for left TKA on 11/05/14 by Dr. Berenice Primas. Case is posted for GA.  History includes non-smoker, AS s/p St[employer] Jude AVR with post-operative phrenic nerve paralysis and respiratory failure s/p tracheostomy '06, subdural hematoma in the setting of supratherapeutic INR (4.6) s/p burr hole, afib s/p multiple cardioversions (5 since 07/2012) s/p RF ablation 11/24/13 [facility]), dyslipidemia, anxiety, HTN, post-operative N/V, GERD, OSA on CPAP, exertional SOB, depression, BMI is consistent with obesity. PCP is Dr. Maurice Small. EP cardiologist is Dr. Norton Pastel [facility]).  Primary Cardiologist is Dr. Fransico Him. Per Dr. Radford Pax: Patient has a history of afib and mechanical AVR. Remote history of SDH ~ 2006. INR goal 2.0-2.5. Patient will need heparin bridge for procedure. Please let us known date so pateint can be admitted 3-4 days prior for heparin.  Meds include Wellbutrin XL, diltiazem, Lasix, Tokosyn, losartan, KCl, pravastatin, Zoloft, warfarin, Ambien, Lasix, Norco.  06/22/14 Nuclear stress test: Overall Impression: Low risk stress nuclear study with small size, moderate-intensity mostly fixed anteroapical and apical defect which likely represents shifting breast attenuation artifact. LV Ejection Fraction: 62%. LV Wall Motion: Normal Wall Motion.  06/17/14 Echo: Normal LV size and systolic function, EF 10-93%. Wall motion normal, there were no regional wall motion abnormalities. Moderate diastolic dysfunction (grade 2). Mildly dilated RV with normal systolic function. Mechanical aortic valve appeared to function normally.No significant mechanical aortic valve stenosis. Mean gradient (S): 7 mm Hg. Mild mitral regurgitation. Biatrial enlargement. Mildly dilated ascending aorta (32mm).  06/01/14 EKG: SR with sinus arrhythmia, first degree AVB, low voltage QRS, septal infarct (age undetermined).  10/26/14 CXR: IMPRESSION: No acute abnormality. Borderline  cardiomegaly.  Labs from 10/26/14 noted. For unknown reasons, there is no CMET result in Epic.  Record indicate that patient is to be admitted prior to surgery, so she will need CMET and PT/PTT done at that time.   Manuela Schwartz at Dr. Berenice Primas' office will be touching base with Dr. Theodosia Blender office regarding plans for admission for heparin bridge. Currently there is a appointment scheduled on 11/01/14 at [facility].  Perhaps decision will be made at that time based on her INR results.  George Hugh [facility] Short Stay Center/Anesthesiology Phone [phone] 10/27/2014 4:57 PM  Addendum: I spoke with Manuela Schwartz at Dr. Berenice Primas' office and reviewed new notes in Epic. Dr. Berenice Primas has spoken with Dr. Radford Pax. He would like to do patient's surgery with her still on Coumadin if pharmacy can keep her INR < 2.5. Her INR on 11/01/14 was 2.1. Currently, I don't see another visit for PT/INR check prior to her surgery, so I'll order a STAT PT/INR for the day of surgery.  At this point, there are no plans for her to be admitted since he is planning not to hold her Coumadin preoperatively. Plan GA.  George Hugh [facility] Short Stay Center/Anesthesiology Phone [phone] 11/02/2014 4:13 PM

## 2014-10-29 ENCOUNTER — Telehealth: Payer: Self-pay | Admitting: Pharmacist

## 2014-10-29 NOTE — Telephone Encounter (Signed)
-----   Message from [name], MD sent at 10/27/2014  3:43 PM EDT ----- Discussed with Dr. Berenice Primas, he would like to try to do [name] surgery with her on Coumadin as long as her INR is not above 2.5.  He plans to do it in another week.  Just FYI when you are seeing her on Monday in coumadin clinic 7/25  Fransico Him ----- Message -----    From: Earnestine Mealing    Sent: 10/26/2014  10:25 AM      To: [name], MD  Please call Dr Dorna Leitz at [phone] on Wednesday regarding this patient.

## 2014-10-29 NOTE — Telephone Encounter (Signed)
Pt's procedure is scheduled for 7/29

## 2014-11-01 ENCOUNTER — Ambulatory Visit (INDEPENDENT_AMBULATORY_CARE_PROVIDER_SITE_OTHER): Payer: Medicare Other | Admitting: Pharmacist

## 2014-11-01 DIAGNOSIS — Z5181 Encounter for therapeutic drug level monitoring: Secondary | ICD-10-CM

## 2014-11-01 DIAGNOSIS — I359 Nonrheumatic aortic valve disorder, unspecified: Secondary | ICD-10-CM

## 2014-11-01 DIAGNOSIS — I4891 Unspecified atrial fibrillation: Secondary | ICD-10-CM

## 2014-11-01 LAB — POCT INR: INR: 2.1

## 2014-11-04 MED ORDER — CEFAZOLIN SODIUM-DEXTROSE 2-3 GM-% IV SOLR
2.0000 g | INTRAVENOUS | Status: AC
  Administered 2014-11-05: 2 g via INTRAVENOUS
  Filled 2014-11-04: qty 50

## 2014-11-05 ENCOUNTER — Inpatient Hospital Stay (HOSPITAL_COMMUNITY): Payer: Medicare Other | Admitting: Certified Registered Nurse Anesthetist

## 2014-11-05 ENCOUNTER — Encounter (HOSPITAL_COMMUNITY)
Admission: RE | Disposition: A | Discharge status: Home Health | Payer: Self-pay | Source: Ambulatory Visit | Attending: Orthopedic Surgery

## 2014-11-05 ENCOUNTER — Inpatient Hospital Stay (HOSPITAL_COMMUNITY): Payer: Medicare Other | Admitting: Vascular Surgery

## 2014-11-05 ENCOUNTER — Inpatient Hospital Stay (HOSPITAL_COMMUNITY)
Admission: RE | Admit: 2014-11-05 | Discharge: 2014-11-13 | DRG: 470 | Disposition: A | Discharge status: Home Health | Payer: Medicare Other | Source: Ambulatory Visit | Attending: Orthopedic Surgery | Admitting: Orthopedic Surgery

## 2014-11-05 ENCOUNTER — Encounter (HOSPITAL_COMMUNITY): Payer: Self-pay | Admitting: *Deleted

## 2014-11-05 DIAGNOSIS — F329 Major depressive disorder, single episode, unspecified: Secondary | ICD-10-CM | POA: Diagnosis present

## 2014-11-05 DIAGNOSIS — I10 Essential (primary) hypertension: Secondary | ICD-10-CM | POA: Diagnosis not present

## 2014-11-05 DIAGNOSIS — M179 Osteoarthritis of knee, unspecified: Secondary | ICD-10-CM | POA: Diagnosis present

## 2014-11-05 DIAGNOSIS — I4891 Unspecified atrial fibrillation: Secondary | ICD-10-CM | POA: Diagnosis not present

## 2014-11-05 DIAGNOSIS — I481 Persistent atrial fibrillation: Secondary | ICD-10-CM | POA: Diagnosis not present

## 2014-11-05 DIAGNOSIS — Z8249 Family history of ischemic heart disease and other diseases of the circulatory system: Secondary | ICD-10-CM

## 2014-11-05 DIAGNOSIS — Z79899 Other long term (current) drug therapy: Secondary | ICD-10-CM

## 2014-11-05 DIAGNOSIS — Z954 Presence of other heart-valve replacement: Secondary | ICD-10-CM | POA: Diagnosis not present

## 2014-11-05 DIAGNOSIS — M1732 Unilateral post-traumatic osteoarthritis, left knee: Secondary | ICD-10-CM | POA: Diagnosis not present

## 2014-11-05 DIAGNOSIS — K219 Gastro-esophageal reflux disease without esophagitis: Secondary | ICD-10-CM | POA: Diagnosis present

## 2014-11-05 DIAGNOSIS — G8918 Other acute postprocedural pain: Secondary | ICD-10-CM | POA: Diagnosis not present

## 2014-11-05 DIAGNOSIS — G4733 Obstructive sleep apnea (adult) (pediatric): Secondary | ICD-10-CM | POA: Diagnosis not present

## 2014-11-05 DIAGNOSIS — I5032 Chronic diastolic (congestive) heart failure: Secondary | ICD-10-CM | POA: Diagnosis present

## 2014-11-05 DIAGNOSIS — E785 Hyperlipidemia, unspecified: Secondary | ICD-10-CM | POA: Diagnosis present

## 2014-11-05 DIAGNOSIS — Z888 Allergy status to other drugs, medicaments and biological substances status: Secondary | ICD-10-CM

## 2014-11-05 DIAGNOSIS — D638 Anemia in other chronic diseases classified elsewhere: Secondary | ICD-10-CM | POA: Diagnosis present

## 2014-11-05 DIAGNOSIS — I480 Paroxysmal atrial fibrillation: Secondary | ICD-10-CM | POA: Diagnosis present

## 2014-11-05 DIAGNOSIS — M1710 Unilateral primary osteoarthritis, unspecified knee: Secondary | ICD-10-CM | POA: Diagnosis present

## 2014-11-05 DIAGNOSIS — S7290XA Unspecified fracture of unspecified femur, initial encounter for closed fracture: Secondary | ICD-10-CM

## 2014-11-05 DIAGNOSIS — Z6835 Body mass index (BMI) 35.0-35.9, adult: Secondary | ICD-10-CM | POA: Diagnosis not present

## 2014-11-05 DIAGNOSIS — Z952 Presence of prosthetic heart valve: Secondary | ICD-10-CM | POA: Diagnosis not present

## 2014-11-05 DIAGNOSIS — Z7901 Long term (current) use of anticoagulants: Secondary | ICD-10-CM | POA: Diagnosis not present

## 2014-11-05 DIAGNOSIS — R791 Abnormal coagulation profile: Secondary | ICD-10-CM | POA: Diagnosis not present

## 2014-11-05 DIAGNOSIS — M1712 Unilateral primary osteoarthritis, left knee: Principal | ICD-10-CM | POA: Diagnosis present

## 2014-11-05 DIAGNOSIS — Z885 Allergy status to narcotic agent status: Secondary | ICD-10-CM

## 2014-11-05 HISTORY — PX: TOTAL KNEE ARTHROPLASTY: SHX125

## 2014-11-05 LAB — COMPREHENSIVE METABOLIC PANEL
ALBUMIN: 4.1 g/dL (ref 3.5–5.0)
ALT: 18 U/L (ref 14–54)
AST: 20 U/L (ref 15–41)
Alkaline Phosphatase: 77 U/L (ref 38–126)
Anion gap: 8 (ref 5–15)
BUN: 19 mg/dL (ref 6–20)
CALCIUM: 9.1 mg/dL (ref 8.9–10.3)
CHLORIDE: 105 mmol/L (ref 101–111)
CO2: 27 mmol/L (ref 22–32)
Creatinine, Ser: 0.79 mg/dL (ref 0.44–1.00)
Glucose, Bld: 107 mg/dL — ABNORMAL HIGH (ref 65–99)
Potassium: 4 mmol/L (ref 3.5–5.1)
Sodium: 140 mmol/L (ref 135–145)
Total Bilirubin: 0.7 mg/dL (ref 0.3–1.2)
Total Protein: 7.3 g/dL (ref 6.5–8.1)

## 2014-11-05 LAB — APTT: aPTT: 35 seconds (ref 24–37)

## 2014-11-05 LAB — PROTIME-INR
INR: 2.47 — AB (ref 0.00–1.49)
PROTHROMBIN TIME: 26.4 s — AB (ref 11.6–15.2)

## 2014-11-05 SURGERY — ARTHROPLASTY, KNEE, TOTAL
Anesthesia: Regional | Site: Knee | Laterality: Left

## 2014-11-05 MED ORDER — PROPOFOL 10 MG/ML IV BOLUS
INTRAVENOUS | Status: AC
  Filled 2014-11-05: qty 20

## 2014-11-05 MED ORDER — METHOCARBAMOL 500 MG PO TABS
500.0000 mg | ORAL_TABLET | Freq: Four times a day (QID) | ORAL | Status: DC | PRN
  Administered 2014-11-05 – 2014-11-12 (×13): 500 mg via ORAL
  Filled 2014-11-05 (×13): qty 1

## 2014-11-05 MED ORDER — FENTANYL CITRATE (PF) 250 MCG/5ML IJ SOLN
INTRAMUSCULAR | Status: AC
  Filled 2014-11-05: qty 5

## 2014-11-05 MED ORDER — OXYCODONE HCL 5 MG PO TABS
5.0000 mg | ORAL_TABLET | ORAL | Status: DC | PRN
  Administered 2014-11-05 – 2014-11-06 (×3): 10 mg via ORAL
  Administered 2014-11-06: 5 mg via ORAL
  Administered 2014-11-06: 10 mg via ORAL
  Administered 2014-11-06: 5 mg via ORAL
  Administered 2014-11-06: 10 mg via ORAL
  Administered 2014-11-06: 5 mg via ORAL
  Administered 2014-11-07 – 2014-11-12 (×12): 10 mg via ORAL
  Filled 2014-11-05 (×2): qty 2
  Filled 2014-11-05: qty 1
  Filled 2014-11-05 (×14): qty 2
  Filled 2014-11-05: qty 1
  Filled 2014-11-05 (×3): qty 2

## 2014-11-05 MED ORDER — ACETAMINOPHEN 325 MG PO TABS: 325.0000 mg | ORAL_TABLET | ORAL | Status: DC | PRN

## 2014-11-05 MED ORDER — BUPIVACAINE LIPOSOME 1.3 % IJ SUSP
20.0000 mL | INTRAMUSCULAR | Status: AC
  Administered 2014-11-05: 20 mL
  Filled 2014-11-05: qty 20

## 2014-11-05 MED ORDER — METOCLOPRAMIDE HCL 5 MG PO TABS: 5.0000 mg | ORAL_TABLET | Freq: Three times a day (TID) | ORAL | Status: DC | PRN

## 2014-11-05 MED ORDER — HYDROMORPHONE HCL 1 MG/ML IJ SOLN
INTRAMUSCULAR | Status: AC
  Filled 2014-11-05: qty 1

## 2014-11-05 MED ORDER — MIDAZOLAM HCL 5 MG/5ML IJ SOLN
INTRAMUSCULAR | Status: DC | PRN
  Administered 2014-11-05: 2 mg via INTRAVENOUS

## 2014-11-05 MED ORDER — EPHEDRINE SULFATE 50 MG/ML IJ SOLN
INTRAMUSCULAR | Status: DC | PRN
  Administered 2014-11-05: 10 mg via INTRAVENOUS

## 2014-11-05 MED ORDER — BISACODYL 10 MG RE SUPP: 10.0000 mg | Freq: Every day | RECTAL | Status: DC | PRN

## 2014-11-05 MED ORDER — DOCUSATE SODIUM 100 MG PO CAPS
100.0000 mg | ORAL_CAPSULE | Freq: Two times a day (BID) | ORAL | Status: DC
  Administered 2014-11-05 – 2014-11-13 (×17): 100 mg via ORAL
  Filled 2014-11-05 (×17): qty 1

## 2014-11-05 MED ORDER — CHLORHEXIDINE GLUCONATE 4 % EX LIQD: 60.0000 mL | Freq: Once | CUTANEOUS | Status: DC

## 2014-11-05 MED ORDER — CEFAZOLIN SODIUM-DEXTROSE 2-3 GM-% IV SOLR
2.0000 g | Freq: Four times a day (QID) | INTRAVENOUS | Status: AC
  Administered 2014-11-05 – 2014-11-07 (×7): 2 g via INTRAVENOUS
  Filled 2014-11-05 (×11): qty 50

## 2014-11-05 MED ORDER — HYDROCODONE-ACETAMINOPHEN 7.5-325 MG PO TABS
1.0000 | ORAL_TABLET | Freq: Four times a day (QID) | ORAL | Status: DC
  Administered 2014-11-05 (×2): 2 via ORAL
  Administered 2014-11-06 (×2): 1 via ORAL
  Administered 2014-11-07 – 2014-11-08 (×6): 2 via ORAL
  Administered 2014-11-08: 1 via ORAL
  Administered 2014-11-09 – 2014-11-12 (×16): 2 via ORAL
  Administered 2014-11-13: 1 via ORAL
  Administered 2014-11-13 (×2): 2 via ORAL
  Filled 2014-11-05: qty 2
  Filled 2014-11-05: qty 1
  Filled 2014-11-05 (×4): qty 2
  Filled 2014-11-05: qty 1
  Filled 2014-11-05 (×6): qty 2
  Filled 2014-11-05: qty 1
  Filled 2014-11-05 (×15): qty 2
  Filled 2014-11-05: qty 1

## 2014-11-05 MED ORDER — OXYCODONE HCL 5 MG PO TABS
ORAL_TABLET | ORAL | Status: AC
  Filled 2014-11-05: qty 1

## 2014-11-05 MED ORDER — MIDAZOLAM HCL 2 MG/2ML IJ SOLN
INTRAMUSCULAR | Status: AC
  Filled 2014-11-05: qty 2

## 2014-11-05 MED ORDER — TRANEXAMIC ACID 1000 MG/10ML IV SOLN: 1000.0000 mg | INTRAVENOUS | Status: DC

## 2014-11-05 MED ORDER — SODIUM CHLORIDE 0.9 % IV SOLN
2000.0000 mg | INTRAVENOUS | Status: AC
  Administered 2014-11-05: 2000 mg via TOPICAL
  Filled 2014-11-05: qty 20

## 2014-11-05 MED ORDER — ROCURONIUM BROMIDE 100 MG/10ML IV SOLN
INTRAVENOUS | Status: DC | PRN
  Administered 2014-11-05: 40 mg via INTRAVENOUS

## 2014-11-05 MED ORDER — FUROSEMIDE 40 MG PO TABS
80.0000 mg | ORAL_TABLET | Freq: Every day | ORAL | Status: DC
  Administered 2014-11-05 – 2014-11-13 (×7): 80 mg via ORAL
  Filled 2014-11-05 (×8): qty 2

## 2014-11-05 MED ORDER — LACTATED RINGERS IV SOLN
INTRAVENOUS | Status: DC | PRN
  Administered 2014-11-05 (×2): via INTRAVENOUS

## 2014-11-05 MED ORDER — POTASSIUM CHLORIDE IN NACL 20-0.9 MEQ/L-% IV SOLN
INTRAVENOUS | Status: DC
  Administered 2014-11-05 – 2014-11-06 (×2): via INTRAVENOUS
  Filled 2014-11-05 (×2): qty 1000

## 2014-11-05 MED ORDER — ACETAMINOPHEN 325 MG PO TABS
650.0000 mg | ORAL_TABLET | Freq: Four times a day (QID) | ORAL | Status: DC | PRN
  Administered 2014-11-07: 650 mg via ORAL
  Filled 2014-11-05: qty 2

## 2014-11-05 MED ORDER — 0.9 % SODIUM CHLORIDE (POUR BTL) OPTIME
TOPICAL | Status: DC | PRN
  Administered 2014-11-05: 1000 mL

## 2014-11-05 MED ORDER — ACETAMINOPHEN 650 MG RE SUPP: 650.0000 mg | Freq: Four times a day (QID) | RECTAL | Status: DC | PRN

## 2014-11-05 MED ORDER — PRAVASTATIN SODIUM 20 MG PO TABS
10.0000 mg | ORAL_TABLET | Freq: Every day | ORAL | Status: DC
  Administered 2014-11-06 – 2014-11-13 (×8): 10 mg via ORAL
  Filled 2014-11-05 (×9): qty 1

## 2014-11-05 MED ORDER — PHENYLEPHRINE HCL 10 MG/ML IJ SOLN
INTRAMUSCULAR | Status: DC | PRN
  Administered 2014-11-05 (×3): 120 ug via INTRAVENOUS

## 2014-11-05 MED ORDER — ACETAMINOPHEN 160 MG/5ML PO SOLN
325.0000 mg | ORAL | Status: DC | PRN
  Filled 2014-11-05: qty 20.3

## 2014-11-05 MED ORDER — FENTANYL CITRATE (PF) 100 MCG/2ML IJ SOLN
INTRAMUSCULAR | Status: DC | PRN
  Administered 2014-11-05: 25 ug via INTRAVENOUS
  Administered 2014-11-05 (×3): 50 ug via INTRAVENOUS
  Administered 2014-11-05: 25 ug via INTRAVENOUS
  Administered 2014-11-05: 50 ug via INTRAVENOUS

## 2014-11-05 MED ORDER — ONDANSETRON HCL 4 MG/2ML IJ SOLN
INTRAMUSCULAR | Status: DC | PRN
  Administered 2014-11-05: 4 mg via INTRAVENOUS

## 2014-11-05 MED ORDER — MAGNESIUM CITRATE PO SOLN: 1.0000 | Freq: Once | ORAL | Status: AC | PRN

## 2014-11-05 MED ORDER — ONDANSETRON HCL 4 MG/2ML IJ SOLN: 4.0000 mg | Freq: Four times a day (QID) | INTRAMUSCULAR | Status: DC | PRN

## 2014-11-05 MED ORDER — HYDROMORPHONE HCL 1 MG/ML IJ SOLN
0.5000 mg | INTRAMUSCULAR | Status: DC | PRN
  Administered 2014-11-05 – 2014-11-06 (×4): 0.5 mg via INTRAVENOUS
  Filled 2014-11-05 (×4): qty 1

## 2014-11-05 MED ORDER — ZOLPIDEM TARTRATE 5 MG PO TABS
5.0000 mg | ORAL_TABLET | Freq: Every evening | ORAL | Status: DC | PRN
  Administered 2014-11-05: 5 mg via ORAL
  Filled 2014-11-05: qty 1

## 2014-11-05 MED ORDER — DEXTROSE 5 % IV SOLN
500.0000 mg | Freq: Four times a day (QID) | INTRAVENOUS | Status: DC | PRN
  Filled 2014-11-05: qty 5

## 2014-11-05 MED ORDER — METOCLOPRAMIDE HCL 5 MG/ML IJ SOLN: 5.0000 mg | Freq: Three times a day (TID) | INTRAMUSCULAR | Status: DC | PRN

## 2014-11-05 MED ORDER — LOSARTAN POTASSIUM 50 MG PO TABS
100.0000 mg | ORAL_TABLET | Freq: Every day | ORAL | Status: DC
  Administered 2014-11-07 – 2014-11-13 (×5): 100 mg via ORAL
  Filled 2014-11-05 (×8): qty 2

## 2014-11-05 MED ORDER — METHOCARBAMOL 1000 MG/10ML IJ SOLN
500.0000 mg | INTRAVENOUS | Status: AC
  Administered 2014-11-05: 500 mg via INTRAVENOUS
  Filled 2014-11-05: qty 5

## 2014-11-05 MED ORDER — DIPHENHYDRAMINE HCL 12.5 MG/5ML PO ELIX: 12.5000 mg | ORAL_SOLUTION | ORAL | Status: DC | PRN

## 2014-11-05 MED ORDER — WARFARIN - PHARMACIST DOSING INPATIENT
Freq: Every day | Status: DC
  Administered 2014-11-12: 18:00:00

## 2014-11-05 MED ORDER — ONDANSETRON HCL 4 MG PO TABS: 4.0000 mg | ORAL_TABLET | Freq: Four times a day (QID) | ORAL | Status: DC | PRN

## 2014-11-05 MED ORDER — OXYCODONE HCL 5 MG/5ML PO SOLN: 5.0000 mg | Freq: Once | ORAL | Status: AC | PRN

## 2014-11-05 MED ORDER — LIDOCAINE HCL (CARDIAC) 20 MG/ML IV SOLN
INTRAVENOUS | Status: DC | PRN
  Administered 2014-11-05: 80 mg via INTRAVENOUS

## 2014-11-05 MED ORDER — DOFETILIDE 250 MCG PO CAPS
250.0000 ug | ORAL_CAPSULE | Freq: Two times a day (BID) | ORAL | Status: DC
  Administered 2014-11-05 – 2014-11-13 (×16): 250 ug via ORAL
  Filled 2014-11-05 (×21): qty 1

## 2014-11-05 MED ORDER — ACETAMINOPHEN 500 MG PO TABS
1000.0000 mg | ORAL_TABLET | Freq: Four times a day (QID) | ORAL | Status: AC
  Administered 2014-11-05 – 2014-11-06 (×3): 1000 mg via ORAL
  Filled 2014-11-05 (×5): qty 2

## 2014-11-05 MED ORDER — BUPIVACAINE HCL (PF) 0.5 % IJ SOLN
INTRAMUSCULAR | Status: DC | PRN
  Administered 2014-11-05: 20 mL

## 2014-11-05 MED ORDER — ALUM & MAG HYDROXIDE-SIMETH 200-200-20 MG/5ML PO SUSP: 30.0000 mL | ORAL | Status: DC | PRN

## 2014-11-05 MED ORDER — MENTHOL 3 MG MT LOZG: 1.0000 | LOZENGE | OROMUCOSAL | Status: DC | PRN

## 2014-11-05 MED ORDER — POTASSIUM CHLORIDE CRYS ER 20 MEQ PO TBCR
40.0000 meq | EXTENDED_RELEASE_TABLET | ORAL | Status: DC
  Administered 2014-11-06 – 2014-11-13 (×6): 40 meq via ORAL
  Filled 2014-11-05 (×7): qty 2

## 2014-11-05 MED ORDER — HYDROMORPHONE HCL 1 MG/ML IJ SOLN
0.2500 mg | INTRAMUSCULAR | Status: DC | PRN
  Administered 2014-11-05 (×3): 0.5 mg via INTRAVENOUS

## 2014-11-05 MED ORDER — PROPOFOL 10 MG/ML IV BOLUS
INTRAVENOUS | Status: DC | PRN
  Administered 2014-11-05: 30 mg via INTRAVENOUS
  Administered 2014-11-05: 140 mg via INTRAVENOUS

## 2014-11-05 MED ORDER — PHENOL 1.4 % MT LIQD: 1.0000 | OROMUCOSAL | Status: DC | PRN

## 2014-11-05 MED ORDER — DILTIAZEM HCL ER BEADS 240 MG PO CP24
240.0000 mg | ORAL_CAPSULE | Freq: Every day | ORAL | Status: DC
  Administered 2014-11-07 – 2014-11-13 (×6): 240 mg via ORAL
  Filled 2014-11-05 (×10): qty 1

## 2014-11-05 MED ORDER — ROPIVACAINE HCL 5 MG/ML IJ SOLN
INTRAMUSCULAR | Status: DC | PRN
  Administered 2014-11-05: 20 mL via PERINEURAL

## 2014-11-05 MED ORDER — WARFARIN SODIUM 5 MG PO TABS
5.0000 mg | ORAL_TABLET | Freq: Once | ORAL | Status: AC
  Administered 2014-11-05: 5 mg via ORAL
  Filled 2014-11-05: qty 1

## 2014-11-05 MED ORDER — SERTRALINE HCL 50 MG PO TABS
150.0000 mg | ORAL_TABLET | Freq: Every day | ORAL | Status: DC
  Administered 2014-11-05 – 2014-11-12 (×8): 150 mg via ORAL
  Filled 2014-11-05 (×5): qty 1
  Filled 2014-11-05: qty 2
  Filled 2014-11-05 (×10): qty 1

## 2014-11-05 MED ORDER — BUPROPION HCL ER (XL) 150 MG PO TB24
150.0000 mg | ORAL_TABLET | Freq: Every day | ORAL | Status: DC
  Administered 2014-11-06 – 2014-11-13 (×8): 150 mg via ORAL
  Filled 2014-11-05 (×9): qty 1

## 2014-11-05 MED ORDER — POLYETHYLENE GLYCOL 3350 17 G PO PACK
17.0000 g | PACK | Freq: Every day | ORAL | Status: DC | PRN
  Administered 2014-11-09 – 2014-11-11 (×2): 17 g via ORAL
  Filled 2014-11-05 (×2): qty 1

## 2014-11-05 MED ORDER — OXYCODONE HCL 5 MG PO TABS
5.0000 mg | ORAL_TABLET | Freq: Once | ORAL | Status: AC | PRN
  Administered 2014-11-05: 5 mg via ORAL

## 2014-11-05 SURGICAL SUPPLY — 62 items
APL SKNCLS STERI-STRIP NONHPOA (GAUZE/BANDAGES/DRESSINGS) ×1
BANDAGE ESMARK 6X9 LF (GAUZE/BANDAGES/DRESSINGS) ×1 IMPLANT
BENZOIN TINCTURE PRP APPL 2/3 (GAUZE/BANDAGES/DRESSINGS) ×3 IMPLANT
BLADE SAGITTAL 25.0X1.19X90 (BLADE) ×2 IMPLANT
BLADE SAGITTAL 25.0X1.19X90MM (BLADE) ×1
BLADE SAW SAG 90X13X1.27 (BLADE) ×6 IMPLANT
BNDG CMPR 9X6 STRL LF SNTH (GAUZE/BANDAGES/DRESSINGS) ×1
BNDG ESMARK 6X9 LF (GAUZE/BANDAGES/DRESSINGS) ×3
BOWL SMART MIX CTS (DISPOSABLE) ×3 IMPLANT
CAP KNEE TOTAL 3 SIGMA ×3 IMPLANT
CEMENT HV SMART SET (Cement) ×6 IMPLANT
CLOSURE WOUND 1/2 X4 (GAUZE/BANDAGES/DRESSINGS) ×2
COVER SURGICAL LIGHT HANDLE (MISCELLANEOUS) ×3 IMPLANT
CUFF TOURNIQUET SINGLE 34IN LL (TOURNIQUET CUFF) ×3 IMPLANT
CUFF TOURNIQUET SINGLE 44IN (TOURNIQUET CUFF) IMPLANT
DRAPE EXTREMITY T 121X128X90 (DRAPE) ×3 IMPLANT
DRAPE IMP U-DRAPE 54X76 (DRAPES) ×3 IMPLANT
DRAPE U-SHAPE 47X51 STRL (DRAPES) ×3 IMPLANT
DRSG MEPILEX BORDER 4X12 (GAUZE/BANDAGES/DRESSINGS) ×3 IMPLANT
DRSG MEPILEX BORDER 4X8 (GAUZE/BANDAGES/DRESSINGS) IMPLANT
DRSG PAD ABDOMINAL 8X10 ST (GAUZE/BANDAGES/DRESSINGS) ×3 IMPLANT
DURAPREP 26ML APPLICATOR (WOUND CARE) ×3 IMPLANT
ELECT REM PT RETURN 9FT ADLT (ELECTROSURGICAL) ×3
ELECTRODE REM PT RTRN 9FT ADLT (ELECTROSURGICAL) ×1 IMPLANT
EVACUATOR 1/8 PVC DRAIN (DRAIN) ×6 IMPLANT
FACESHIELD WRAPAROUND (MASK) ×3 IMPLANT
GAUZE SPONGE 4X4 12PLY STRL (GAUZE/BANDAGES/DRESSINGS) ×3 IMPLANT
GLOVE BIOGEL PI IND STRL 8 (GLOVE) ×2 IMPLANT
GLOVE BIOGEL PI INDICATOR 8 (GLOVE) ×4
GLOVE ECLIPSE 7.5 STRL STRAW (GLOVE) ×6 IMPLANT
GOWN STRL REUS W/ TWL LRG LVL3 (GOWN DISPOSABLE) ×1 IMPLANT
GOWN STRL REUS W/ TWL XL LVL3 (GOWN DISPOSABLE) ×2 IMPLANT
GOWN STRL REUS W/TWL LRG LVL3 (GOWN DISPOSABLE) ×3
GOWN STRL REUS W/TWL XL LVL3 (GOWN DISPOSABLE) ×6
HANDPIECE INTERPULSE COAX TIP (DISPOSABLE) ×3
HOOD PEEL AWAY FACE SHEILD DIS (HOOD) ×9 IMPLANT
IMMOBILIZER KNEE 20 (SOFTGOODS) IMPLANT
IMMOBILIZER KNEE 22 UNIV (SOFTGOODS) ×3 IMPLANT
KIT BASIN OR (CUSTOM PROCEDURE TRAY) ×3 IMPLANT
KIT ROOM TURNOVER OR (KITS) ×3 IMPLANT
MANIFOLD NEPTUNE II (INSTRUMENTS) ×3 IMPLANT
NEEDLE SPNL 22GX3.5 QUINCKE BK (NEEDLE) ×3 IMPLANT
NS IRRIG 1000ML POUR BTL (IV SOLUTION) ×3 IMPLANT
PACK TOTAL JOINT (CUSTOM PROCEDURE TRAY) ×3 IMPLANT
PACK UNIVERSAL I (CUSTOM PROCEDURE TRAY) ×3 IMPLANT
PAD ARMBOARD 7.5X6 YLW CONV (MISCELLANEOUS) ×6 IMPLANT
PAD CAST 4YDX4 CTTN HI CHSV (CAST SUPPLIES) ×1 IMPLANT
PADDING CAST COTTON 4X4 STRL (CAST SUPPLIES) ×3
SET HNDPC FAN SPRY TIP SCT (DISPOSABLE) ×1 IMPLANT
STAPLER VISISTAT 35W (STAPLE) IMPLANT
STRIP CLOSURE SKIN 1/2X4 (GAUZE/BANDAGES/DRESSINGS) ×4 IMPLANT
SUCTION FRAZIER TIP 10 FR DISP (SUCTIONS) ×3 IMPLANT
SUT MNCRL AB 3-0 PS2 18 (SUTURE) IMPLANT
SUT VIC AB 0 CTB1 27 (SUTURE) ×6 IMPLANT
SUT VIC AB 1 CT1 27 (SUTURE) ×9
SUT VIC AB 1 CT1 27XBRD ANBCTR (SUTURE) ×3 IMPLANT
SUT VIC AB 2-0 CTB1 (SUTURE) ×6 IMPLANT
SYR 50ML LL SCALE MARK (SYRINGE) ×3 IMPLANT
TOWEL OR 17X24 6PK STRL BLUE (TOWEL DISPOSABLE) ×3 IMPLANT
TOWEL OR 17X26 10 PK STRL BLUE (TOWEL DISPOSABLE) ×3 IMPLANT
TRAY FOLEY CATH 16FRSI W/METER (SET/KITS/TRAYS/PACK) IMPLANT
WRAP KNEE MAXI GEL POST OP (GAUZE/BANDAGES/DRESSINGS) ×3 IMPLANT

## 2014-11-05 NOTE — Evaluation (Signed)
Physical Therapy Evaluation Patient Details Name: [name] MRN: [id number] DOB: [birth year] Today's Date: 11/05/2014   History of Present Illness  Pt is s/p L TKA.  Pt's PMH includes morbid obesity, CHF, a fib, HTN, anxiety and depression.  Clinical Impression  Pt is s/p L TKA resulting in the deficits listed below (see PT Problem List). Pt lethargic this session but ambulated 15 ft in room w/ min guard assist. Min assist for sit<>stand and supine>sit.  [name] will have 24/7 supervision from her husband upon d/c.  Pt will benefit from skilled PT to increase their independence and safety with mobility to allow discharge to the venue listed below.     Follow Up Recommendations Home health PT;Supervision/Assistance - 24 hour    Equipment Recommendations  None recommended by PT    Recommendations for Other Services       Precautions / Restrictions Precautions Precautions: Fall;Knee Precaution Booklet Issued: Yes (comment) Precaution Comments: Reviewed no pillow under knee Required Braces or Orthoses: Knee Immobilizer - Left Knee Immobilizer - Left: Discontinue once straight leg raise with < 10 degree lag Restrictions Weight Bearing Restrictions: Yes LLE Weight Bearing: Weight bearing as tolerated      Mobility  Bed Mobility Overal bed mobility: Needs Assistance Bed Mobility: Supine to Sit     Supine to sit: Min assist;HOB elevated     General bed mobility comments: Min assist managing LLE to EOB.  Cues for sequencing w/ HOB elevated and use of bed rails.  Transfers Overall transfer level: Needs assistance Equipment used: Rolling walker (2 wheeled) Transfers: Sit to/from Stand Sit to Stand: Min assist;From elevated surface         General transfer comment: Min assist to push up from sitting w/ VCs provided for technique and hand placement.  Pt w/ controlled descent to recliner chair after VCs provided for hand  placement.  Ambulation/Gait Ambulation/Gait assistance: Min guard Ambulation Distance (Feet): 15 Feet Assistive device: Rolling walker (2 wheeled) Gait Pattern/deviations: Step-to pattern;Antalgic;Shuffle;Decreased stride length;Decreased stance time - left;Decreased weight shift to left   Gait velocity interpretation: Below normal speed for age/gender General Gait Details: Inc WB through BUE to offload LLE.  Slight trunk flexion. Cues for proper sequencing w/ RW.  Stairs            Wheelchair Mobility    Modified Rankin (Stroke Patients Only)       Balance Overall balance assessment: Needs assistance Sitting-balance support: Bilateral upper extremity supported;Feet supported Sitting balance-Leahy Scale: Fair     Standing balance support: Bilateral upper extremity supported;During functional activity Standing balance-Leahy Scale: Poor Standing balance comment: Relies on RW for support                             Pertinent Vitals/Pain Pain Assessment: 0-10 Pain Score: 8  Pain Location: L knee Pain Descriptors / Indicators: Aching;Throbbing Pain Intervention(s): Limited activity within patient's tolerance;Monitored during session;Repositioned;Patient requesting pain meds-RN notified;RN gave pain meds during session    [place] expects to be discharged to:: Private residence Living Arrangements: Spouse/significant other Available Help at Discharge: Family;Available 24 hours/day Type of Home: Apartment ([place]) Home Access: Level entry     Home Layout: One level Home Equipment: Walker - 2 wheels;Bedside commode      Prior Function Level of Independence: Independent               Hand Dominance  Extremity/Trunk Assessment   Upper Extremity Assessment: Defer to OT evaluation           Lower Extremity Assessment: LLE deficits/detail   LLE Deficits / Details: weakness and limited ROM s/p L TKA      Communication   Communication: No difficulties  Cognition Arousal/Alertness: Lethargic Behavior During Therapy: WFL for tasks assessed/performed Overall Cognitive Status: Within Functional Limits for tasks assessed                      General Comments      Exercises Total Joint Exercises Ankle Circles/Pumps: AROM;Both;10 reps;Supine Quad Sets: AROM;Both;10 reps;Supine      Assessment/Plan    PT Assessment Patient needs continued PT services  PT Diagnosis Difficulty walking;Abnormality of gait;Generalized weakness;Acute pain   PT Problem List Decreased strength;Decreased range of motion;Decreased activity tolerance;Decreased balance;Decreased mobility;Decreased coordination;Decreased knowledge of use of DME;Decreased safety awareness;Decreased knowledge of precautions;Decreased skin integrity;Pain  PT Treatment Interventions DME instruction;Gait training;Stair training;Functional mobility training;Therapeutic activities;Therapeutic exercise;Balance training;Neuromuscular re-education;Patient/family education;Modalities   PT Goals (Current goals can be found in the Care Plan section) Acute Rehab PT Goals Patient Stated Goal: to get stronger so she can go home PT Goal Formulation: With patient Time For Goal Achievement: 11/12/14 Potential to Achieve Goals: Good    Frequency 7X/week   Barriers to discharge        Co-evaluation               End of Session Equipment Utilized During Treatment: Gait belt;Left knee immobilizer Activity Tolerance: Patient limited by lethargy;Patient limited by pain Patient left: in chair;with call bell/phone within reach;with nursing/sitter in room Nurse Communication: Mobility status;Precautions;Weight bearing status         Time: 2993-7169 PT Time Calculation (min) (ACUTE ONLY): 33 min   Charges:   PT Evaluation $Initial PT Evaluation Tier I: 1 Procedure PT Treatments $Therapeutic Exercise: 8-22 mins   PT G Codes:        [name] PT, DPT [phone] Pager: [phone] 11/05/2014, 2:53 PM

## 2014-11-05 NOTE — Anesthesia Preprocedure Evaluation (Signed)
Anesthesia Evaluation  Patient identified by MRN, date of birth, ID band Patient awake    Reviewed: Allergy & Precautions, NPO status , Patient's Chart, lab work & pertinent test results  History of Anesthesia Complications (+) PONV and history of anesthetic complications  Airway Mallampati: II  TM Distance: >3 FB Neck ROM: Full    Dental  (+) Teeth Intact   Pulmonary shortness of breath and with exertion, sleep apnea and Continuous Positive Airway Pressure Ventilation , neg recent URI,  breath sounds clear to auscultation        Cardiovascular hypertension, Pt. on medications +CHF + dysrhythmias + Valvular Problems/Murmurs Rhythm:Regular + Systolic Click    Neuro/Psych Anxiety Depression Subdural hematoma s/p AVR     GI/Hepatic Neg liver ROS, GERD-  Medicated and Controlled,  Endo/Other  Morbid obesity  Renal/GU negative Renal ROS     Musculoskeletal  (+) Arthritis -,   Abdominal   Peds  Hematology negative hematology ROS (+)   Anesthesia Other Findings   Reproductive/Obstetrics                             Anesthesia Physical Anesthesia Plan  ASA: III  Anesthesia Plan: General   Post-op Pain Management: GA combined w/ Regional for post-op pain   Induction: Intravenous  Airway Management Planned: Oral ETT and LMA  Additional Equipment: None  Intra-op Plan:   Post-operative Plan: Extubation in OR  Informed Consent: I have reviewed the patients History and Physical, chart, labs and discussed the procedure including the risks, benefits and alternatives for the proposed anesthesia with the patient or authorized representative who has indicated his/her understanding and acceptance.     Plan Discussed with: CRNA and Surgeon  Anesthesia Plan Comments:         Anesthesia Quick Evaluation

## 2014-11-05 NOTE — Progress Notes (Signed)
Patient complains of pain. Patient requests to get into chair. RN assisted patient to chair at bedside and administered pain medication. Nursing will continue to monitor.

## 2014-11-05 NOTE — Transfer of Care (Signed)
Immediate Anesthesia Transfer of Care Note  Patient: [name]  Procedure(s) Performed: Procedure(s): TOTAL KNEE ARTHROPLASTY (Left)  Patient Location: PACU  Anesthesia Type:General and Regional  Level of Consciousness: awake, alert , oriented and patient cooperative  Airway & Oxygen Therapy: Patient Spontanous Breathing and Patient connected to nasal cannula oxygen  Post-op Assessment: Report given to RN, Post -op Vital signs reviewed and stable, Patient moving all extremities, Patient moving all extremities X 4 and Patient able to stick tongue midline  Post vital signs: Reviewed and stable  Last Vitals:  Filed Vitals:   11/05/14 1004  BP: 109/45  Pulse: 78  Temp: 36.5 C  Resp: 11    Complications: No apparent anesthesia complications

## 2014-11-05 NOTE — Op Note (Signed)
[name], [name] NO.:  [id number]  MEDICAL RECORD NO.:  [id number]  LOCATION:  [id number]                        FACILITY:  [facility]  PHYSICIAN:  Alta Corning, M.D.   DATE OF BIRTH:  12-23-1945  DATE OF PROCEDURE:  11/05/2014 DATE OF DISCHARGE:                              OPERATIVE REPORT   PREOPERATIVE DIAGNOSIS:  End-stage degenerative joint disease, left knee.  POSTOPERATIVE DIAGNOSIS:  End-stage degenerative joint disease, left knee.  PRINCIPAL PROCEDURE:  Left total knee replacement with a Sigma system, size 2.5 femur, size 2.5 tibia, 12.5-mm bridging bearing, and a 35-mm all-polyethylene patella.  SURGEON:  Alta Corning, M.D.  ASSISTANT[employer]ell [employer]. Mercie Eon.  ANESTHESIA:  General with an adductor canal block.  BRIEF HISTORY:  [name] is a 69 year old female with long history of significant complaints of left knee pain.  X-ray showed bone-on-bone change, she had failed injection therapy, viscous supplementation, physical therapy and activity modification.  She has had night pain and light activity pain.  Because of failure of all conservative care, she was taken to the operating room for left total knee replacement.  DESCRIPTION OF PROCEDURE:  The patient was taken to the operating table. After adequate anesthesia was obtained with general anesthetic, the patient was placed supine on the operating table.  Following this, the knee was prepped and draped in usual sterile fashion.  Following this, the leg was exsanguinated and blood pressure tourniquet was inflated to 300 mmHg.  Following this, a midline incision was made in the subcutaneous tissues down to the level of the extensor mechanism and a medial parapatellar arthrotomy was undertaken.  Following this, retropatellar fat pad, synovium on the anterior aspect of the femur, medial and lateral meniscus, and the anterior and posterior cruciates were excised.  Attention was then turned  to the femur where an intramedullary pilot hole was drilled.  A 5-degree valgus inclination with 11 distal bone was taken.  Following this, attention was turned towards sizing with size 2.5.  Anterior and posterior cuts were made, chamfers and box.  Attention was turned to the tibia, which was cut perpendicular to its long axis and following this, the spacer block was put in place.  At this point, the tibia was sized to a 2.5, osteophytes were removed and then drilled and keeled, and attention was then turned towards placement of the trials, 2.5 tibia, 2.5 femur, 10-mm trial was used, gets excellent range of motion and stability, little bit loose in flexion, extension was decent and at that point, we went to the patella, cut it down to a level of 14 mm.  Lugs were drilled for the patella and 35 patella was chosen and trial components were put in place at this point.  Knee put through a range of motion.  Excellent stability was achieved, but range of motion was excellent at this point.  Attention was then turned towards removal of all trial components.  Osteophytes were removed from the remaining side of the tibia and at this point, the knee was copiously and thoroughly lavaged with pulsatile lavage irrigation and suctioned completely dry.  Final components were cemented in place, size 2.5 femur, size 2.5 tibia, 10-mm bridging  bearing trial was placed and a 35 all poly patella was placed.  Following this, the excess bone cement was removed.  The cement was allowed to completely harden, 40 mL of 20 mL Exparel and 20 mL of 0.5% Marcaine were instilled all throughout and around the knee and the synovial reflection for postoperative pain control.  Tranexamic acid 2 g and 50 mL of saline were instilled into the knee for 5 minutes to the try to effect some additional control of bleeding locally and following this, when the cement was completely hardened, the tourniquet was let down.   Bleeding was attempted to be controlled with electrocautery.  She does have the high INR because of her preoperative issues and at this point, a medium Hemovac drain was placed.  We checked the poly again and actually trialed it with a 12.5, it was little loose in flexion, this actually came nicely into extension after removal of posterior osteophytes.  The 12.5 was chosen, was opened in place.  Excellent range of motion and stability were achieved at this point.  The medial parapatellar arthrotomy was then closed with 1 Vicryl running, skin with 0 and 2-0 Vicryl and 3-0 Monocryl subcuticular.  Benzoin and Steri-Strips were applied.  Sterile compressive dressing was applied.  The patient was taken to the recovery room, she was noted to be in satisfactory condition with estimated blood loss for the procedure being minimal.     Alta Corning, M.D.     [name]  D:  11/05/2014  T:  11/05/2014  Job:  [id number]

## 2014-11-05 NOTE — Care Management Note (Addendum)
Case Management Note  Patient Details  Name: CARELY NAPPIER MRN: [id number] Date of Birth: 1946/01/07  Subjective/Objective:     69 yr old female s/p left total knee arthroplasty.               Action/Plan:  Case manager spoke with patient concerning home health and DME needs.   Patient was preoperatively setup with [facility], no changes. Patient has family support at discharge. Rolling walker, 3in1 and CPM have been delivered to her home.    Expected Discharge Date:   11/07/14               Expected Discharge Plan:   Home with Home Health  In-House Referral:  NA  Discharge planning Services  CM Consult  Post Acute Care Choice:  Home Health Choice offered to:     DME Arranged:    DME Agency:   [employer]   HH Arranged:  PT HH Agency:  [facility]  Status of Service:  In process, will continue to follow  Medicare Important Message Given:    Date Medicare IM Given:    Medicare IM give by:    Date Additional Medicare IM Given:    Additional Medicare Important Message give by:     If discussed at [place] of Stay Meetings, dates discussed:    Additional Comments:  Ninfa Meeker, RN 11/05/2014, 2:50 PM

## 2014-11-05 NOTE — Care Management Utilization Note (Signed)
Utilization review completed. Stephane Junkins, RN Case Manager [phone]. 

## 2014-11-05 NOTE — H&P (Signed)
TOTAL KNEE ADMISSION H&P  Patient is being admitted for left total knee arthroplasty.  Subjective:  Chief Complaint:left knee pain.  HPI: [name], 69 y.o. female, has a history of pain and functional disability in the left knee due to arthritis and has failed non-surgical conservative treatments for greater than 12 weeks to includeNSAID's and/or analgesics, corticosteriod injections, viscosupplementation injections, flexibility and strengthening excercises, weight reduction as appropriate and activity modification.  Onset of symptoms was gradual, starting 8 years ago with gradually worsening course since that time. The patient noted no past surgery on the left knee(s).  Patient currently rates pain in the left knee(s) at 8 out of 10 with activity. Patient has night pain, worsening of pain with activity and weight bearing, pain that interferes with activities of daily living, pain with passive range of motion, crepitus and joint swelling.  Patient has evidence of subchondral cysts, subchondral sclerosis, periarticular osteophytes, joint subluxation and joint space narrowing by imaging studies. This patient has had failure of all reasonable conservative care. There is no active infection.  Patient Active Problem List   Diagnosis Date Noted  . Encounter for therapeutic drug monitoring 05/07/2013  . Obstructive sleep apnea 01/20/2013  . Morbid obesity 01/20/2013  . Aortic stenosis 01/15/2013  . S/P AVR (aortic valve replacement) 01/15/2013  . Chronic diastolic CHF (congestive heart failure) 11/14/2012  . Atrial fibrillation 07/17/2012  . Essential hypertension 07/17/2012  . Chronic anticoagulation 07/17/2012   Past Medical History  Diagnosis Date  . Obesity   . Hypertension   . Aortic stenosis     status post aortic valve replacement with St. Jude mechanical prosthesis  . Subdural hematoma     in setting of INR greater than 2.2 shortly after AVR - now cleared by neurosurgery to  maintain INR 2-2.5  . Dyslipidemia   . Anxiety   . Exertional shortness of breath   . Arthritis     "right little finger" (11/12/2012)  . Depression   . GERD (gastroesophageal reflux disease)     "several years ago; none since" (11/12/2012)  . Chronic anticoagulation   . Hyperlipidemia   . Persistent atrial fibrillation     4/14 TEE cardioversion success but return to Atrial fibrillation on 07/29/12 then S/P TEE/DCCV after Tikosyn load and now back in atrial fibrillation  . 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 PONV (postoperative nausea and vomiting)   . Dysrhythmia   . Sleep apnea     On CPAP at 12cm H2O  . DJD (degenerative joint disease) of knee     Past Surgical History  Procedure Laterality Date  . Burr hole for subdural hematoma  2006  . Knee arthroscopy Left 1980's    "2" (11/12/2012)  . Tee without cardioversion N/A 07/22/2012    Procedure: TRANSESOPHAGEAL ECHOCARDIOGRAM (TEE);  Surgeon: [name], MD;  Location: [facility] ENDOSCOPY;  Service: Cardiovascular;  Laterality: N/A;  Rm 2034  . Cardioversion N/A 07/22/2012    Procedure: CARDIOVERSION;  Surgeon: [name], MD;  Location: [facility] ENDOSCOPY;  Service: Cardiovascular;  Laterality: N/A;  . Knee surgery Left 1980's    "after 2 scopes they went in and did some kind of OR" (11/12/2012)  . Tubal ligation  1980's  . Cardiac valve replacement  2006    St. Jude AVR  . Cardioversion N/A 11/25/2012    Procedure: CARDIOVERSION;  Surgeon: [name], MD;  Location: [facility];  Service: Cardiovascular;  Laterality: N/A;  .  Cardioversion N/A 12/30/2012    Procedure: CARDIOVERSION;  Surgeon: [name], MD;  Location: [facility] ENDOSCOPY;  Service: Cardiovascular;  Laterality: N/A;  h&p in file-HW   . Cardioversion N/A 03/30/2013    Procedure: CARDIOVERSION;  Surgeon: [name], MD;  Location: [facility] ENDOSCOPY;  Service: Cardiovascular;  Laterality: N/A;  . Tee without cardioversion N/A 10/07/2013     Procedure: TRANSESOPHAGEAL ECHOCARDIOGRAM (TEE);  Surgeon: [name], MD;  Location: [facility] ENDOSCOPY;  Service: Cardiovascular;  Laterality: N/A;  . Electrophysiologic study  11/2013    @ [facility]    Prescriptions prior to admission  Medication Sig Dispense Refill Last Dose  . Ascorbic Acid (VITAMIN C) 1000 MG tablet Take 2,000 mg by mouth daily.   Past Week at Unknown time  . buPROPion (WELLBUTRIN XL) 150 MG 24 hr tablet Take 150 mg by mouth daily.   11/05/2014 at 0500  . calcium-vitamin D (OSCAL WITH D) 500-200 MG-UNIT per tablet Take 2 tablets by mouth daily.    Past Week at Unknown time  . Cholecalciferol (VITAMIN D) 2000 UNITS tablet Take 4,000 Units by mouth daily.   Past Week at Unknown time  . diltiazem (TIAZAC) 240 MG 24 hr capsule Take 240 mg by mouth daily.   11/05/2014 at 0500  . dofetilide (TIKOSYN) 250 MCG capsule Take 250 mcg by mouth 2 (two) times daily.   11/05/2014 at 0500  . furosemide (LASIX) 80 MG tablet TAKE 1 TABLET BY MOUTH EVERY DAY 90 tablet 1 11/04/2014 at Unknown time  . HYDROcodone-acetaminophen (NORCO/VICODIN) 5-325 MG per tablet Take 1 tablet by mouth every 6 (six) hours as needed for moderate pain.   0 Past Week at Unknown time  . losartan (COZAAR) 100 MG tablet Take 100 mg by mouth daily.   11/05/2014 at 0500  . Multiple Vitamin (MULTIVITAMIN WITH MINERALS) TABS Take 1 tablet by mouth daily.   Past Week at Unknown time  . potassium chloride SA (K-DUR,KLOR-CON) 20 MEQ tablet Take 2 tablets (40 mEq total) by mouth every morning. Take one tablet (20 mEq total) by mouth every evening. 270 tablet 3 Past Week at Unknown time  . pravastatin (PRAVACHOL) 10 MG tablet Take 10 mg by mouth daily.   11/04/2014 at Unknown time  . sertraline (ZOLOFT) 100 MG tablet Take 150 mg by mouth at bedtime.   11/04/2014 at Unknown time  . tretinoin (RETIN-A) 0.05 % cream Apply 1 application topically at bedtime.    11/04/2014 at Unknown time  . warfarin (COUMADIN) 4 MG tablet Take as directed by  [facility] 120 tablet 0 11/04/2014 at Unknown time  . zolpidem (AMBIEN) 10 MG tablet Take 5 mg by mouth at bedtime as needed for sleep.   11/04/2014 at Unknown time   Allergies  Allergen Reactions  . Codeine Anaphylaxis    jerking   . Beta Adrenergic Blockers     Makes her crazy   . Metoprolol     depression  . Propoxyphene Other (See Comments)    Bad vivid dreams  . Toprol Xl [Metoprolol Tartrate]     depression  . Dilantin [Phenytoin Sodium Extended] Rash and Itching    History  Substance Use Topics  . Smoking status: Never Smoker   . Smokeless tobacco: Never Used  . Alcohol Use: No     Comment: 11/12/2012 "no alcohol in the last 9 years"    Family History  Problem Relation Age of Onset  . Lung cancer Mother   . Hypertension Mother   .  Hyperlipidemia Father   . Hypertension Father   . Heart disease Brother      ROS ROS: I have reviewed the patient's review of systems thoroughly and there are no positive responses as relates to the HPI.  Objective:  Physical Exam  Vital signs in last 24 hours: Temp:  [98.4 F (36.9 C)] 98.4 F (36.9 C) (07/29 0556) Pulse Rate:  [77] 77 (07/29 0556) Resp:  [20] 20 (07/29 0556) BP: (152)/(52) 152/52 mmHg (07/29 0556) SpO2:  [95 %] 95 % (07/29 0556) Well-developed well-nourished patient in no acute distress. Alert and oriented x3 HEENT:within normal limits Cardiac: Regular rate and rhythm Pulmonary: Lungs clear to auscultation Abdomen: Soft and nontender.  Normal active bowel sounds  Musculoskeletal: (left knee: Painful range of motion.  Limited range of motion of 0-105.  No instability.  Trace effusion.  No erythema.  2+ distal pulses left side. Labs: Recent Results (from the past 2160 hour(s))  POCT INR     Status: None   Collection Time: 08/24/14  9:27 AM  Result Value Ref Range   INR 1.8   POCT INR     Status: None   Collection Time: 09/17/14 10:08 AM  Result Value Ref Range   INR 2.0   POCT INR     Status: None    Collection Time: 10/15/14 10:28 AM  Result Value Ref Range   INR 3.4   Surgical pcr screen     Status: None   Collection Time: 10/26/14  9:12 AM  Result Value Ref Range   MRSA, PCR NEGATIVE NEGATIVE   Staphylococcus aureus NEGATIVE NEGATIVE    Comment:        The Xpert SA Assay (FDA approved for NASAL specimens in patients over 28 years of age), is one component of a comprehensive surveillance program.  Test performance has been validated by [facility] for patients greater than or equal to 62 year old. It is not intended to diagnose infection nor to guide or monitor treatment.   CBC WITH DIFFERENTIAL     Status: Abnormal   Collection Time: 10/26/14  9:13 AM  Result Value Ref Range   WBC 6.7 4.0 - 10.5 K/uL    Comment: WHITE COUNT CONFIRMED ON SMEAR REPEATED TO VERIFY    RBC 4.27 3.87 - 5.11 MIL/uL   Hemoglobin 13.4 12.0 - 15.0 g/dL   HCT 38.6 36.0 - 46.0 %   MCV 90.4 78.0 - 100.0 fL   MCH 31.4 26.0 - 34.0 pg   MCHC 34.7 30.0 - 36.0 g/dL   RDW 13.4 11.5 - 15.5 %   Platelets 285 150 - 400 K/uL    Comment: PLATELET COUNT CONFIRMED BY SMEAR REPEATED TO VERIFY    Neutrophils Relative % 60 43 - 77 %   Lymphocytes Relative 24 12 - 46 %   Monocytes Relative 11 3 - 12 %   Eosinophils Relative 3 0 - 5 %   Basophils Relative 2 (H) 0 - 1 %   Neutro Abs 4.1 1.7 - 7.7 K/uL   Lymphs Abs 1.6 0.7 - 4.0 K/uL   Monocytes Absolute 0.7 0.1 - 1.0 K/uL   Eosinophils Absolute 0.2 0.0 - 0.7 K/uL   Basophils Absolute 0.1 0.0 - 0.1 K/uL   Smear Review MORPHOLOGY UNREMARKABLE   Type and screen     Status: None   Collection Time: 10/26/14  9:20 AM  Result Value Ref Range   ABO/RH(D) O NEG    Antibody Screen NEG  Sample Expiration 11/09/2014   ABO/Rh     Status: None   Collection Time: 10/26/14  9:20 AM  Result Value Ref Range   ABO/RH(D) O NEG   POCT INR     Status: None   Collection Time: 11/01/14  8:55 AM  Result Value Ref Range   INR 2.1     Estimated body mass index is  35.70 kg/(m^2) as calculated from the following:   Height as of 10/26/14: 5\' 7"  (1.702 m).   Weight as of 06/22/14: 228 lb (103.42 kg).   Imaging Review Plain radiographs demonstrate severe degenerative joint disease of the left knee(s). The overall alignment ismild varus. The bone quality appears to be fair for age and reported activity level.  Assessment/Plan:  End stage arthritis, left knee   The patient history, physical examination, clinical judgment of the provider and imaging studies are consistent with end stage degenerative joint disease of the left knee(s) and total knee arthroplasty is deemed medically necessary. The treatment options including medical management, injection therapy arthroscopy and arthroplasty were discussed at length. The risks and benefits of total knee arthroplasty were presented and reviewed. The risks due to aseptic loosening, infection, stiffness, patella tracking problems, thromboembolic complications and other imponderables were discussed. The patient acknowledged the explanation, agreed to proceed with the plan and consent was signed. Patient is being admitted for inpatient treatment for surgery, pain control, PT, OT, prophylactic antibiotics, VTE prophylaxis, progressive ambulation and ADL's and discharge planning. The patient is planning to be discharged home with home health services

## 2014-11-05 NOTE — Progress Notes (Signed)
Orthopedic Tech Progress Note Patient Details:  [name] [birth year] [id number]  CPM Left Knee CPM Left Knee: On Left Knee Flexion (Degrees): 40 Left Knee Extension (Degrees): 0 Additional Comments: trapeze batr patient helper Viewed order from doctor's order list  Hildred Priest 11/05/2014, 10:47 AM

## 2014-11-05 NOTE — Anesthesia Postprocedure Evaluation (Signed)
  Anesthesia Post-op Note  Patient: [name]  Procedure(s) Performed: Procedure(s): TOTAL KNEE ARTHROPLASTY (Left)  Patient Location: PACU  Anesthesia Type:General and Regional  Level of Consciousness: awake  Airway and Oxygen Therapy: Patient Spontanous Breathing  Post-op Pain: mild  Post-op Assessment: Post-op Vital signs reviewed, Patient's Cardiovascular Status Stable, Respiratory Function Stable, Patent Airway, No signs of Nausea or vomiting and Pain level controlled LLE Motor Response: Purposeful movement LLE Sensation: Full sensation          Post-op Vital Signs: Reviewed and stable  Last Vitals:  Filed Vitals:   11/05/14 1124  BP: 107/55  Pulse: 76  Temp: 36.5 C  Resp: 14    Complications: No apparent anesthesia complications

## 2014-11-05 NOTE — Anesthesia Procedure Notes (Addendum)
Procedure Name: Intubation Date/Time: 11/05/2014 7:36 AM Performed by: Shirlyn Goltz Pre-anesthesia Checklist: Patient identified, Emergency Drugs available, Suction available and Patient being monitored Patient Re-evaluated:Patient Re-evaluated prior to inductionOxygen Delivery Method: Circle system utilized Preoxygenation: Pre-oxygenation with 100% oxygen Intubation Type: IV induction and Cricoid Pressure applied Ventilation: Mask ventilation without difficulty and Oral airway inserted - appropriate to patient size Laryngoscope Size: Miller and 2 (anterior larynx) Grade View: Grade III Tube type: Oral Tube size: 7.0 mm Number of attempts: 1 Airway Equipment and Method: Stylet Placement Confirmation: ETT inserted through vocal cords under direct vision,  positive ETCO2 and breath sounds checked- equal and bilateral Secured at: 21 cm Tube secured with: Tape Dental Injury: Teeth and Oropharynx as per pre-operative assessment    Anesthesia Regional Block:  Adductor canal block  Pre-Anesthetic Checklist: ,, timeout performed, Correct Patient, Correct Site, Correct Laterality, Correct Procedure, Correct Position, site marked, Risks and benefits discussed,  Surgical consent,  Pre-op evaluation,  At surgeon's request and post-op pain management  Laterality: Lower and Left  Prep: chloraprep       Needles:  Injection technique: Single-shot  Needle Type: Echogenic Stimulator Needle          Additional Needles:  Procedures: ultrasound guided (picture in chart) Adductor canal block Narrative:  Injection made incrementally with aspirations every 5 mL.  Performed by: Personally  Anesthesiologist: Kacy Conely, CHRIS  Additional Notes: H+P and labs reviewed, risks and benefits discussed with patient, procedure tolerated well without complications

## 2014-11-05 NOTE — Progress Notes (Signed)
ANTICOAGULATION CONSULT NOTE - Initial Consult  Pharmacy Consult:  Coumadin Indication:  History of Afib + history of mechanical AVR + VTE prophylaxis post left TKA  Allergies  Allergen Reactions  . Codeine Anaphylaxis    jerking   . Beta Adrenergic Blockers     Makes her crazy   . Metoprolol     depression  . Propoxyphene Other (See Comments)    Bad vivid dreams  . Toprol Xl [Metoprolol Tartrate]     depression  . Dilantin [Phenytoin Sodium Extended] Rash and Itching    Patient Measurements: Height: 5\' 7"  (170.2 cm) IBW/kg (Calculated) : 61.6  Vital Signs: Temp: 97.7 F (36.5 C) (07/29 1124) Temp Source: Axillary (07/29 1124) BP: 107/55 mmHg (07/29 1124) Pulse Rate: 76 (07/29 1124)  Labs:  Recent Labs  11/05/14 0621  APTT 35  LABPROT 26.4*  INR 2.47*  CREATININE 0.79    Estimated Creatinine Clearance: 81.9 mL/min (by C-G formula based on Cr of 0.79).   Medical History: Past Medical History  Diagnosis Date  . Obesity   . Hypertension   . Aortic stenosis     status post aortic valve replacement with St. Jude mechanical prosthesis  . Subdural hematoma     in setting of INR greater than 2.2 shortly after AVR - now cleared by neurosurgery to maintain INR 2-2.5  . Dyslipidemia   . Anxiety   . Exertional shortness of breath   . Arthritis     "right little finger" (11/12/2012)  . Depression   . GERD (gastroesophageal reflux disease)     "several years ago; none since" (11/12/2012)  . Chronic anticoagulation   . Hyperlipidemia   . Persistent atrial fibrillation     4/14 TEE cardioversion success but return to Atrial fibrillation on 07/29/12 then S/P TEE/DCCV after Tikosyn load and now back in atrial fibrillation  . 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 PONV (postoperative nausea and vomiting)   . Dysrhythmia   . Sleep apnea     On CPAP at 12cm H2O  . DJD (degenerative joint disease) of knee        Assessment: 69  YOF to continue on Coumadin from PTA for history of Afib, mechanical AVR and VTE prophylaxis s/p left TKA 11/05/14.  Patient's INR is therapeutic on home dose of 4mg  PO daily except 6mg  on Mon and Fri.  No overt bleeding reported.  Noted from patient's PMH that she has a history of subdural hematoma post AVR while her INR was 2.2.   Goal of Therapy:  INR 2 - 2.5 per PMH    Plan:  - Coumadin 5mg  PO today - Daily PT / INR    Codee Bloodworth D. Mina Marble, PharmD, BCPS Pager:  [phone] 11/05/2014, 11:41 AM

## 2014-11-05 NOTE — Brief Op Note (Signed)
11/05/2014  9:15 AM  PATIENT:  [name]  69 y.o. female  PRE-OPERATIVE DIAGNOSIS:  degenerative joint disease left knee  POST-OPERATIVE DIAGNOSIS:  degenerative joint disease left knee  PROCEDURE:  Procedure(s): TOTAL KNEE ARTHROPLASTY (Left)  SURGEON:  Surgeon(s) and Role:    * Dorna Leitz, MD - Primary  PHYSICIAN ASSISTANT:   ASSISTANTS: blair roberts pa   ANESTHESIA:   general  EBL:  Total I/O In: 1000 [I.V.:1000] Out: 260 [Urine:60; Blood:200]  BLOOD ADMINISTERED:none  DRAINS: (1 med) Hemovact drain(s) in the l knee with  Suction Open   LOCAL MEDICATIONS USED:  OTHER experel  SPECIMEN:  No Specimen  DISPOSITION OF SPECIMEN:  N/A  COUNTS:  YES  TOURNIQUET:   Total Tourniquet Time Documented: Thigh (Left) - 56 minutes Total: Thigh (Left) - 56 minutes   DICTATION: .Other Dictation: Dictation Number [id number]  PLAN OF CARE: Admit to inpatient   PATIENT DISPOSITION:  PACU - hemodynamically stable.   Delay start of Pharmacological VTE agent (>24hrs) due to surgical blood loss or risk of bleeding: no

## 2014-11-06 LAB — CBC
HEMATOCRIT: 30.8 % — AB (ref 36.0–46.0)
Hemoglobin: 10.2 g/dL — ABNORMAL LOW (ref 12.0–15.0)
MCH: 31.2 pg (ref 26.0–34.0)
MCHC: 33.1 g/dL (ref 30.0–36.0)
MCV: 94.2 fL (ref 78.0–100.0)
PLATELETS: 255 10*3/uL (ref 150–400)
RBC: 3.27 MIL/uL — ABNORMAL LOW (ref 3.87–5.11)
RDW: 13.9 % (ref 11.5–15.5)
WBC: 13.5 10*3/uL — AB (ref 4.0–10.5)

## 2014-11-06 LAB — BASIC METABOLIC PANEL
Anion gap: 8 (ref 5–15)
BUN: 17 mg/dL (ref 6–20)
CO2: 25 mmol/L (ref 22–32)
CREATININE: 0.81 mg/dL (ref 0.44–1.00)
Calcium: 8.4 mg/dL — ABNORMAL LOW (ref 8.9–10.3)
Chloride: 103 mmol/L (ref 101–111)
GFR calc Af Amer: >60 mL/min (ref >60)
GFR calc non Af Amer: >60 mL/min (ref >60)
Glucose, Bld: 141 mg/dL — ABNORMAL HIGH (ref 65–99)
Potassium: 4.4 mmol/L (ref 3.5–5.1)
Sodium: 136 mmol/L (ref 135–145)

## 2014-11-06 LAB — PROTIME-INR
INR: 3.74 — ABNORMAL HIGH (ref 0.00–1.49)
Prothrombin Time: 36.2 seconds — ABNORMAL HIGH (ref 11.6–15.2)

## 2014-11-06 MED ORDER — METHOCARBAMOL 500 MG PO TABS: 500.0000 mg | ORAL_TABLET | Freq: Four times a day (QID) | ORAL | Status: DC | PRN

## 2014-11-06 MED ORDER — HYDROCODONE-ACETAMINOPHEN 7.5-325 MG PO TABS: 1.0000 | ORAL_TABLET | Freq: Four times a day (QID) | ORAL | Status: DC

## 2014-11-06 MED ORDER — METOCLOPRAMIDE HCL 5 MG PO TABS: 5.0000 mg | ORAL_TABLET | Freq: Three times a day (TID) | ORAL | Status: DC | PRN

## 2014-11-06 MED ORDER — HYDROMORPHONE HCL 1 MG/ML IJ SOLN
1.0000 mg | INTRAMUSCULAR | Status: DC | PRN
  Administered 2014-11-06 – 2014-11-10 (×4): 1 mg via INTRAVENOUS
  Filled 2014-11-06 (×4): qty 1

## 2014-11-06 MED ORDER — DIPHENHYDRAMINE HCL 12.5 MG/5ML PO ELIX: 12.5000 mg | ORAL_SOLUTION | ORAL | Status: DC | PRN

## 2014-11-06 MED ORDER — OXYCODONE HCL ER 10 MG PO T12A: 10.0000 mg | EXTENDED_RELEASE_TABLET | Freq: Two times a day (BID) | ORAL | Status: DC

## 2014-11-06 NOTE — Progress Notes (Signed)
Administered 0.5 IV dilaudid for 10/10 pain. Pt's complaining that previously administered pain medication was not working. Reviewed pt's MAR. Discussed with pt timing on PO vs IV mediation. Pt stated that she had just called her husband to come pick her up and take her home. This RN expressed concerns with her leaving at time time, especially concerning pain control, mobility, and safety. Pt called husband back and asked that he not come at this time, and restated our conversation. This RN spoke with pt's husband regarding pain after surgery and available medication. Pt's husband appreciative of conversation and this RN's efforts to minimize pt's pain at this time. Continued to talk with pt about non-pharmacological ways to minimize pain, including deep breathing and distraction. Encouraged pt to watch TV and use personal cell phone in efforts to aid with distraction. Also explained to patient when she could safely receive next doses of PRN pain medication based on her MD's orders and updated times on pt's white board. Discussed with pt CPM use and time, as well as physical therapy. Pt appreciative of efforts. Call bell within reach. Nursing will continue to monitor.

## 2014-11-06 NOTE — Progress Notes (Signed)
Pt c/o of pain 9/10 to left knee and anxiety. Pt is tachypnic. States, "I am having a panic attack." I explained to the pt to try and take some deep slow breaths while we obtained VS. 98.6-90-17-117/48. O2 sat is 91% on RA. I placed O2 at 2L via nasal canula. Administered Dilaudid 1mg  IV.

## 2014-11-06 NOTE — Progress Notes (Signed)
Pt now saying she feels much better. Pain is now 2/10. Pt is calm and resting comfortably. Will continue to monitor.

## 2014-11-06 NOTE — Evaluation (Signed)
Occupational Therapy Evaluation Patient Details Name: [name] MRN: [id number] DOB: [birth year] Today's Date: 11/06/2014    History of Present Illness Pt is s/p L TKA.  Pt's PMH includes morbid obesity, CHF, a fib, HTN, anxiety and depression.   Clinical Impression   Pt s/p above. Pt independent with ADLs, PTA. Feel pt will benefit from acute OT to increase independence prior to d/c.     Follow Up Recommendations  Home health OT;Supervision/Assistance - 24 hour    Equipment Recommendations  Other (comment) (AE)    Recommendations for Other Services       Precautions / Restrictions Precautions Precautions: Fall;Knee Precaution Booklet Issued: No Precaution Comments: reviewed knee precautions Required Braces or Orthoses: Knee Immobilizer - Left Knee Immobilizer - Left: Discontinue once straight leg raise with < 10 degree lag Restrictions Weight Bearing Restrictions: Yes LLE Weight Bearing: Weight bearing as tolerated      Mobility Bed Mobility Overal bed mobility: Needs Assistance Bed Mobility: Supine to Sit     Supine to sit: Supervision        Transfers Overall transfer level: Needs assistance   Transfers: Sit to/from Stand Sit to Stand: Mod assist         General transfer comment: assist to boost and to get balance. Cues for technique/hand placement. Assist to control descent when going to sit in chair.     Balance  Assist to gain balance upon standing from bed. Min guard for short distance ambulation with RW.                                          ADL Overall ADL's : Needs assistance/impaired     Grooming: Wash/dry face;Set up;Supervision/safety;Sitting               Lower Body Dressing: Sit to/from stand;Moderate assistance   Toilet Transfer: Min guard;Ambulation;RW;Moderate assistance (Mod assist for sit to stand from bed)           Functional mobility during ADLs: Min guard;Rolling walker General ADL  Comments: Educated on LB dressing technique. Educated on safety such as bag on walker and sitting to get clothing over feet. Recommended someone be with her for shower transfer. Educated on shower transfer technique. Explained AE is available for ADLs.      Vision  Pt wears glasses all the time.   Perception     Praxis      Pertinent Vitals/Pain Pain Assessment: 0-10 Pain Score:  (1-2 at beginning) Pain Location: left knee Pain Descriptors / Indicators:  (noices that indicated pain) Pain Intervention(s): Repositioned;Monitored during session;Limited activity within patient's tolerance     Hand Dominance     Extremity/Trunk Assessment Upper Extremity Assessment Upper Extremity Assessment: Overall WFL for tasks assessed   Lower Extremity Assessment Lower Extremity Assessment: Defer to PT evaluation       Communication Communication Communication: No difficulties   Cognition Arousal/Alertness: Lethargic;Suspect due to medications Behavior During Therapy: [facility] for tasks assessed/performed Overall Cognitive Status:  (slow processing-suspect due to meds)                     General Comments       Exercises       Shoulder Instructions      Home Living Family/patient expects to be discharged to:: Private residence Living Arrangements: Spouse/significant other Available Help at Discharge: Family;Available 24 hours/day  Type of Home: Other(Comment) (townhome) Home Access: Level entry     Home Layout: One level     Bathroom Shower/Tub: Astronomer Accessibility: Yes How Accessible: Accessible via walker Home Equipment: [place] - 2 wheels;Bedside commode;Shower seat - built in          Prior Functioning/Environment Level of Independence: Independent             OT Diagnosis: Acute pain   OT Problem List: Decreased strength;Decreased range of motion;Decreased activity tolerance;Decreased knowledge of use of DME or AE;Decreased  knowledge of precautions;Pain;Impaired balance (sitting and/or standing)   OT Treatment/Interventions: Therapeutic activities;Patient/family education;Balance training;Self-care/ADL training;DME and/or AE instruction;Cognitive remediation/compensation    OT Goals(Current goals can be found in the care plan section) Acute Rehab OT Goals Patient Stated Goal: not stated OT Goal Formulation: With patient Time For Goal Achievement: 11/13/14 Potential to Achieve Goals: Good ADL Goals Pt Will Perform Lower Body Dressing: with set-up;sit to/from stand;with supervision Pt Will Transfer to Toilet: with supervision;ambulating (3 in 1 over commode) Pt Will Perform Toileting - Clothing Manipulation and hygiene: with supervision;sit to/from stand Pt Will Perform Tub/Shower Transfer: Shower transfer;with supervision;ambulating;rolling walker;3 in 1  OT Frequency: Min 2X/week   Barriers to D/C:            Co-evaluation              End of Session Equipment Utilized During Treatment: Gait belt;Rolling walker;Left knee immobilizer CPM Left Knee CPM Left Knee: Off Nurse Communication: Other (comment) (pt sweating a lot)  Activity Tolerance: Other (comment) (sweating/pale) Patient left: in chair;with call bell/phone within reach;with family/visitor present   Time: 0981-1914 OT Time Calculation (min): 22 min Charges:  OT General Charges $OT Visit: 1 Procedure OT Evaluation $Initial OT Evaluation Tier I: 1 Procedure G-CodesBenito Mccreedy OTR/L [id number]  11/06/2014, 9:43 AM

## 2014-11-06 NOTE — Progress Notes (Signed)
Physical Therapy Treatment Patient Details Name: [name] MRN: [id number] DOB: 11/07/1945 Today's Date: 11/06/2014    History of Present Illness Pt is s/p L TKA.  Pt's PMH includes morbid obesity, CHF, a fib, HTN, anxiety and depression.    PT Comments    Pt ambulated 25 ft; however 2/2 low BP this morning and c/o dizziness pt will benefit from further therapy to increase functional independence and ensure pt safety.  Pt's BP 96/56 in sitting at end of therapy session.   Follow Up Recommendations  Home health PT;Supervision/Assistance - 24 hour     Equipment Recommendations  None recommended by PT    Recommendations for Other Services       Precautions / Restrictions Precautions Precautions: Fall;Knee Precaution Booklet Issued: No Precaution Comments: reviewed knee precautions Required Braces or Orthoses: Knee Immobilizer - Left Knee Immobilizer - Left: Discontinue once straight leg raise with < 10 degree lag Restrictions Weight Bearing Restrictions: Yes LLE Weight Bearing: Weight bearing as tolerated    Mobility  Bed Mobility Overal bed mobility: Needs Assistance Bed Mobility: Supine to Sit     Supine to sit: Supervision     General bed mobility comments: in recliner  Transfers Overall transfer level: Needs assistance Equipment used: Rolling walker (2 wheeled) Transfers: Sit to/from Stand Sit to Stand: Min guard         General transfer comment: Min guard for safety and cues for hand placement to push from recliner chair as pt attempts to pull on RW to stand up.  Good technique for stand>sit.  Ambulation/Gait Ambulation/Gait assistance: Min guard Ambulation Distance (Feet): 25 Feet Assistive device: Rolling walker (2 wheeled) Gait Pattern/deviations: Step-to pattern;Antalgic;Decreased weight shift to left;Decreased stride length;Decreased stance time - left   Gait velocity interpretation: Below normal speed for age/gender General Gait Details:  Good upright posture.  Cues to look up where she is going while ambulating.  Pt initially reports 1/10 lightheadedness when standing which progressed to 2-3/10 after ambulating 25 ft and pt sat in chair.  BP 96/56, RN notified.   Stairs            Wheelchair Mobility    Modified Rankin (Stroke Patients Only)       Balance Overall balance assessment: Needs assistance Sitting-balance support: Bilateral upper extremity supported;Feet supported Sitting balance-Leahy Scale: Fair     Standing balance support: Bilateral upper extremity supported;During functional activity Standing balance-Leahy Scale: Poor Standing balance comment: Relies on RW for support and reports dizziness (2-3/10)                    Cognition Arousal/Alertness: Lethargic Behavior During Therapy: WFL for tasks assessed/performed Overall Cognitive Status: Within Functional Limits for tasks assessed                      Exercises Total Joint Exercises Ankle Circles/Pumps: AROM;Both;10 reps;Seated Quad Sets: AROM;Both;10 reps;Seated Straight Leg Raises: AAROM;Left;5 reps;Seated    General Comments        Pertinent Vitals/Pain Pain Assessment: 0-10 Pain Score: 3  Pain Location: L knee Pain Descriptors / Indicators: Discomfort;Grimacing;Aching Pain Intervention(s): Limited activity within patient's tolerance;Monitored during session;Repositioned    Home Living Family/patient expects to be discharged to:: Private residence Living Arrangements: Spouse/significant other Available Help at Discharge: Family;Available 24 hours/day Type of Home: Other(Comment) (townhome) Home Access: Level entry   Home Layout: One level Home Equipment: Walker - 2 wheels;Bedside commode;Shower seat - built in  Prior Function Level of Independence: Independent          PT Goals (current goals can now be found in the care plan section) Acute Rehab PT Goals Patient Stated Goal: to get stronger so  she can go home PT Goal Formulation: With patient Time For Goal Achievement: 11/12/14 Potential to Achieve Goals: Good Progress towards PT goals: Progressing toward goals    Frequency  7X/week    PT Plan Current plan remains appropriate    Co-evaluation             End of Session Equipment Utilized During Treatment: Gait belt;Left knee immobilizer Activity Tolerance: Patient limited by lethargy;Patient limited by pain Patient left: in chair;with call bell/phone within reach     Time: 1050-1111 PT Time Calculation (min) (ACUTE ONLY): 21 min  Charges:  $Gait Training: 8-22 mins                    G Codes:      Joslyn Hy PT, [place] [phone] Pager: [phone] 11/06/2014, 11:19 AM

## 2014-11-06 NOTE — Progress Notes (Signed)
    Patient doing OK 1 day PO L TKR per Dr Berenice Primas. Reports having a rough night with pain control, has been receiving norco, dilaudid IV and robaxin around the clock. Has not yet been OOB, has eaten breakfast without issue, is somewhat disgruntled about nighttime staff and "being neglected" Pt did receive medications around the clock overnight. She is feeling some better this am. Has been in CPM. Reports desire to go home. Husband at bedside   Physical Exam: Filed Vitals:   11/06/14 0546  BP: 103/54  Pulse: 99  Temp: 98.4 F (36.9 C)  Resp: 16   CBC Latest Ref Rng 10/26/2014 10/02/2013 03/23/2013  WBC 4.0 - 10.5 K/uL 6.7 5.6 6.6  Hemoglobin 12.0 - 15.0 g/dL 13.4 12.5 12.9  Hematocrit 36.0 - 46.0 % 38.6 37.3 38.7  Platelets 150 - 400 K/uL 285 247.0 287.0   CMP Latest Ref Rng 11/06/2014 11/05/2014 06/01/2014  Glucose 65 - 99 mg/dL 141(H) 107(H) 93  BUN 6 - 20 mg/dL 17 19 14   Creatinine 0.44 - 1.00 mg/dL 0.81 0.79 0.69  Sodium 135 - 145 mmol/L 136 140 138  Potassium 3.5 - 5.1 mmol/L 4.4 4.0 4.1  Chloride 101 - 111 mmol/L 103 105 102  CO2 22 - 32 mmol/L 25 27 32  Calcium 8.9 - 10.3 mg/dL 8.4(L) 9.1 9.5  Total Protein 6.5 - 8.1 g/dL - 7.3 -  Total Bilirubin 0.3 - 1.2 mg/dL - 0.7 -  Alkaline Phos 38 - 126 U/L - 77 -  AST 15 - 41 U/L - 20 -  ALT 14 - 54 U/L - 18 -  Pharmacy covering INR, desired range 2-2.5, this am 3.74  Pt laying in bed comfortably, Dressing in place, drain in place, 150cc output, pulled w/o difficulty. Only mild swelling about the knee, no substantial hematoma to drain. Distal compartments soft and NT, SCD on contralateral side, 2+ DPP, NVI  POD #1 s/p L TKR per Dr Berenice Primas  -Pt having issue with pain control, will increase IV dilaudid to 1 PRN, will D/C on Norco and robaxin and add on ocycontin q12 for 5 days - Ice and ace wrap for compression and cold for swelling  -Provide ace for home use - up with PT/OT, encourage ambulation  -Cont CPM, has home unit  -D/C  after PT today  -HH ordered -Coumadin per pharmacy for DVT prophylaxis - d/c home today after PT

## 2014-11-06 NOTE — Progress Notes (Signed)
Physical Therapy Treatment Patient Details Name: [name] MRN: [id number] DOB: 1945-06-18 Today's Date: 11/06/2014    History of Present Illness Pt is s/p L TKA.  Pt's PMH includes morbid obesity, CHF, a fib, HTN, anxiety and depression.    PT Comments    Increased ambulatory endurance this session; however knee buckle at end of session 2/2 fatigue.  Pt will benefit from continued skilled PT services to increase functional independence and safety.  Follow Up Recommendations  Home health PT;Supervision/Assistance - 24 hour     Equipment Recommendations  None recommended by PT    Recommendations for Other Services       Precautions / Restrictions Precautions Precautions: Fall;Knee Precaution Comments: reviewed knee precautions Required Braces or Orthoses: Knee Immobilizer - Left Knee Immobilizer - Left: Discontinue once straight leg raise with < 10 degree lag Restrictions Weight Bearing Restrictions: Yes LLE Weight Bearing: Weight bearing as tolerated    Mobility  Bed Mobility               General bed mobility comments: in recliner  Transfers Overall transfer level: Needs assistance Equipment used: Rolling walker (2 wheeled) Transfers: Sit to/from Stand Sit to Stand: Min guard         General transfer comment: Min guard for safety, pt w/ good technique.  Ambulation/Gait Ambulation/Gait assistance: Min guard Ambulation Distance (Feet): 40 Feet Assistive device: Rolling walker (2 wheeled) Gait Pattern/deviations: Step-to pattern;Antalgic;Decreased weight shift to left;Decreased stride length;Decreased stance time - left   Gait velocity interpretation: Below normal speed for age/gender General Gait Details: knee buckle x2 at end of ambulation, KI on allowing pt to maintain balance.     Stairs            Wheelchair Mobility    Modified Rankin (Stroke Patients Only)       Balance Overall balance assessment: Needs  assistance Sitting-balance support: Feet supported;No upper extremity supported Sitting balance-Leahy Scale: Fair     Standing balance support: Bilateral upper extremity supported;During functional activity Standing balance-Leahy Scale: Poor Standing balance comment: RW for support                    Cognition Arousal/Alertness: Lethargic Behavior During Therapy: WFL for tasks assessed/performed Overall Cognitive Status: Within Functional Limits for tasks assessed                      Exercises Total Joint Exercises Ankle Circles/Pumps: AROM;Both;10 reps;Seated Quad Sets: AROM;Both;10 reps;Seated Knee Flexion: AROM;Left;5 reps;Seated;AAROM Goniometric ROM: 87 deg L knee flexion    General Comments General comments (skin integrity, edema, etc.): BP 112/53 at end of session sitting in chair, RN notified.      Pertinent Vitals/Pain Pain Assessment: Faces Faces Pain Scale: Hurts even more Pain Location: L knee Pain Descriptors / Indicators: Aching;Discomfort Pain Intervention(s): Limited activity within patient's tolerance;Monitored during session;Repositioned    Home Living                      Prior Function            PT Goals (current goals can now be found in the care plan section) Acute Rehab PT Goals Patient Stated Goal: to get stronger so she can go home PT Goal Formulation: With patient Time For Goal Achievement: 11/12/14 Potential to Achieve Goals: Good Progress towards PT goals: Progressing toward goals    Frequency  7X/week    PT Plan Current plan remains appropriate  Co-evaluation             End of Session Equipment Utilized During Treatment: Gait belt;Left knee immobilizer Activity Tolerance: Patient limited by pain;Patient limited by fatigue Patient left: in chair;with call bell/phone within reach     Time: 1429-1455 PT Time Calculation (min) (ACUTE ONLY): 26 min  Charges:  $Gait Training: 8-22  mins $Therapeutic Exercise: 8-22 mins                    G Codes:      Joslyn Hy PT, [place] [phone] Pager: [phone] 11/06/2014, 3:56 PM

## 2014-11-06 NOTE — Plan of Care (Signed)
Problem: Consults Goal: Diagnosis- Total Joint Replacement Primary Total Knee Left     

## 2014-11-06 NOTE — Progress Notes (Signed)
ANTICOAGULATION CONSULT NOTE - Initial Consult  Pharmacy Consult:  Coumadin Indication:  History of Afib + history of mechanical AVR + VTE prophylaxis post left TKA  Allergies  Allergen Reactions  . Codeine Anaphylaxis    jerking   . Beta Adrenergic Blockers     Makes her crazy   . Metoprolol     depression  . Propoxyphene Other (See Comments)    Bad vivid dreams  . Toprol Xl [Metoprolol Tartrate]     depression  . Dilantin [Phenytoin Sodium Extended] Rash and Itching    Patient Measurements: Height: 5\' 7"  (170.2 cm) IBW/kg (Calculated) : 61.6  Vital Signs: Temp: 98.4 F (36.9 C) (07/30 0546) Temp Source: Oral (07/30 0546) BP: 103/54 mmHg (07/30 0546) Pulse Rate: 99 (07/30 0546)  Labs:  Recent Labs  11/05/14 0621 11/06/14 0420  APTT 35  --   LABPROT 26.4* 36.2*  INR 2.47* 3.74*  CREATININE 0.79 0.81    Estimated Creatinine Clearance: 80.9 mL/min (by C-G formula based on Cr of 0.81).    Assessment: 68 YOF to continue on Coumadin from PTA for history of Afib, mechanical AVR and VTE prophylaxis s/p left TKA 11/05/14.  Noted from patient's PMH that she has a history of subdural hematoma post AVR when her INR was 2.2.  Patient's INR is currently supra-therapeutic at 3.74, even with a lower than home dose yesterday.  No bleeding reported.   Goal of Therapy:  INR 2 - 2.5 per PMH    Plan:  - Hold Coumadin today - Daily PT / INR    Anakaren Campion D. Mina Marble, PharmD, BCPS Pager:  [phone] 11/06/2014, 8:16 AM

## 2014-11-07 DIAGNOSIS — M1712 Unilateral primary osteoarthritis, left knee: Secondary | ICD-10-CM | POA: Diagnosis present

## 2014-11-07 LAB — PROTIME-INR
INR: 6.08 (ref 0.00–1.49)
INR: 5.9 (ref 0.00–1.49)
PROTHROMBIN TIME: 50.9 s — AB (ref 11.6–15.2)
Prothrombin Time: 52.1 seconds — ABNORMAL HIGH (ref 11.6–15.2)

## 2014-11-07 LAB — CBC
HCT: 28.9 % — ABNORMAL LOW (ref 36.0–46.0)
Hemoglobin: 9.3 g/dL — ABNORMAL LOW (ref 12.0–15.0)
MCH: 29.7 pg (ref 26.0–34.0)
MCHC: 32.2 g/dL (ref 30.0–36.0)
MCV: 92.3 fL (ref 78.0–100.0)
PLATELETS: 206 10*3/uL (ref 150–400)
RBC: 3.13 MIL/uL — ABNORMAL LOW (ref 3.87–5.11)
RDW: 13.9 % (ref 11.5–15.5)
WBC: 11 10*3/uL — AB (ref 4.0–10.5)

## 2014-11-07 LAB — BASIC METABOLIC PANEL
Anion gap: 4 — ABNORMAL LOW (ref 5–15)
BUN: 24 mg/dL — AB (ref 6–20)
CALCIUM: 8.8 mg/dL — AB (ref 8.9–10.3)
CO2: 28 mmol/L (ref 22–32)
CREATININE: 0.92 mg/dL (ref 0.44–1.00)
Chloride: 103 mmol/L (ref 101–111)
GFR calc Af Amer: >60 mL/min (ref >60)
GLUCOSE: 151 mg/dL — AB (ref 65–99)
Potassium: 5 mmol/L (ref 3.5–5.1)
SODIUM: 135 mmol/L (ref 135–145)

## 2014-11-07 LAB — GLUCOSE, CAPILLARY: Glucose-Capillary: 145 mg/dL — ABNORMAL HIGH (ref 65–99)

## 2014-11-07 MED ORDER — VITAMIN K1 10 MG/ML IJ SOLN
5.0000 mg | Freq: Once | INTRAVENOUS | Status: AC
  Administered 2014-11-07: 5 mg via INTRAVENOUS
  Filled 2014-11-07: qty 0.5

## 2014-11-07 NOTE — Consult Note (Signed)
[facility] Medical Consultation  CALLYN SEVERTSON ZDG:[id number] DOB: [birth year] DOA: 11/05/2014 PCP: Jonathon Bellows, MD   Requesting physician:  Date of consultation: 11/07/2014 Reason for consultation: elevated INR  Impression/Recommendations Active Problems:   DJD (degenerative joint disease) of knee - Postop day #2, status post left TKR - Management per primary team   Supratherapeutic INR - Patient advised to report any signs of bleeding - Hemoglobin drop overnight noted from 10.2 --> 9.3 - We'll give one dose of vitamin K 5 mg IV now - Repeat PT/INR in the morning   Atrial fibrillation, rate controlled, CHADS score 3-4 - Now supratherapeutic INR, continue to hold - Rate controlled, continue Cardizem - Continue Tikosyn   Obesity - BMI greater than 30   Anemia of chronic disease - No signs of active bleeding for now, monitor CBC while inpatient  I will followup again tomorrow. Please contact me if I can be of assistance in the meanwhile. Thank you for this consultation.  Chief Complaint: Left knee pain  HPI:  Patient is 69 year old female with known history of advanced left knee arthritis, failed nonsurgical conservative treatment which included anti-inflammatory medications, analgesia 6, corticosteroid-induced injections, presented to [facility] on 11/05/2014 with progressively worsening left knee pain, 8/10 in severity, occasionally but not consistently radiating to the entire extremity, worse with movement and weightbearing, no specific alleviating factors. Orthopedic team admitted patient for left TKR. Patient is now postop day 2 status post L TKR. Clinically stable, doing well, reports pain is much better controlled, noted to have progressively worsening INR since admission: 3.74 --> 6.08 --> 5.90. TRH consulted for further recommendations. Patient currently denies chest pain or shortness of breath, no abdominal or urinary concerns, no reports of bleeding. Patient  explains she has history of subdural hematoma and her INR has been followed closely with recommended range 2-2.5   Review of Systems  Constitutional: Negative for fever, chills, diaphoresis, activity change, appetite change and fatigue.  HENT: Negative for ear pain, nosebleeds, congestion, facial swelling, rhinorrhea, neck pain, neck stiffness and ear discharge.   Eyes: Negative for pain, discharge, redness, itching and visual disturbance.  Respiratory: Negative for cough, choking, chest tightness, shortness of breath, wheezing and stridor.   Cardiovascular: Negative for chest pain, palpitations and leg swelling.  Gastrointestinal: Negative for abdominal distention.  Genitourinary: Negative for dysuria, urgency, frequency, hematuria, flank pain, decreased urine volume, difficulty urinating and dyspareunia.  Musculoskeletal: Negative for back pain, joint swelling, and gait problem.  Neurological: Negative for dizziness, tremors, seizures, syncope, facial asymmetry, speech difficulty, weakness, light-headedness, numbness and headaches.  Hematological: Negative for adenopathy. Does not bruise/bleed easily.  Psychiatric/Behavioral: Negative for hallucinations, behavioral problems, confusion, dysphoric mood, decreased concentration and agitation.    Past Medical History  Diagnosis Date  . Obesity   . Hypertension   . Aortic stenosis     status post aortic valve replacement with St. Jude mechanical prosthesis  . Subdural hematoma     in setting of INR greater than 2.2 shortly after AVR - now cleared by neurosurgery to maintain INR 2-2.5  . Dyslipidemia   . Anxiety   . Exertional shortness of breath   . Arthritis     "right little finger" (11/12/2012)  . Depression   . GERD (gastroesophageal reflux disease)     "several years ago; none since" (11/12/2012)  . Chronic anticoagulation   . Hyperlipidemia   . Persistent atrial fibrillation     4/14 TEE cardioversion success but return to Atrial  fibrillation on 07/29/12 then S/P TEE/DCCV after Tikosyn load and now back in atrial fibrillation  . 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 PONV (postoperative nausea and vomiting)   . Dysrhythmia   . Sleep apnea     On CPAP at 12cm H2O  . DJD (degenerative joint disease) of knee    Past Surgical History  Procedure Laterality Date  . Burr hole for subdural hematoma  2006  . Knee arthroscopy Left 1980's    "2" (11/12/2012)  . Tee without cardioversion N/A 07/22/2012    Procedure: TRANSESOPHAGEAL ECHOCARDIOGRAM (TEE);  Surgeon: [name], MD;  Location: [facility] ENDOSCOPY;  Service: Cardiovascular;  Laterality: N/A;  Rm 2034  . Cardioversion N/A 07/22/2012    Procedure: CARDIOVERSION;  Surgeon: [name], MD;  Location: [facility] ENDOSCOPY;  Service: Cardiovascular;  Laterality: N/A;  . Knee surgery Left 1980's    "after 2 scopes they went in and did some kind of OR" (11/12/2012)  . Tubal ligation  1980's  . Cardiac valve replacement  2006    St. Jude AVR  . Cardioversion N/A 11/25/2012    Procedure: CARDIOVERSION;  Surgeon: [name], MD;  Location: [place];  Service: Cardiovascular;  Laterality: N/A;  . Cardioversion N/A 12/30/2012    Procedure: CARDIOVERSION;  Surgeon: [name], MD;  Location: [facility] ENDOSCOPY;  Service: Cardiovascular;  Laterality: N/A;  h&p in file-HW   . Cardioversion N/A 03/30/2013    Procedure: CARDIOVERSION;  Surgeon: [name], MD;  Location: [facility] ENDOSCOPY;  Service: Cardiovascular;  Laterality: N/A;  . Tee without cardioversion N/A 10/07/2013    Procedure: TRANSESOPHAGEAL ECHOCARDIOGRAM (TEE);  Surgeon: [name], MD;  Location: [facility] ENDOSCOPY;  Service: Cardiovascular;  Laterality: N/A;  . Electrophysiologic study  11/2013    @ [place] History:  reports that she has never smoked. She has never used smokeless tobacco. She reports that she does not drink alcohol or use illicit drugs.  Allergies  Allergen  Reactions  . Codeine Anaphylaxis    jerking   . Beta Adrenergic Blockers     Makes her crazy   . Metoprolol     depression  . Propoxyphene Other (See Comments)    Bad vivid dreams  . Toprol Xl [Metoprolol Tartrate]     depression  . Dilantin [Phenytoin Sodium Extended] Rash and Itching   Family History  Problem Relation Age of Onset  . Lung cancer Mother   . Hypertension Mother   . Hyperlipidemia Father   . Hypertension Father   . Heart disease Brother     Medication Sig  buPROPion 150 MG 24 hr tablet Take 150 mg by mouth daily.  diltiazem 240 MG 24 hr capsule Take 240 mg by mouth daily.  dofetilide (TIKOSYN) 250 MCG capsule Take 250 mcg by mouth 2 (two) times daily.  furosemide (LASIX) 80 MG tablet TAKE 1 TABLET BY MOUTH EVERY DAY  Vicodin 5-325 MG per tablet Take 1 tablet  every 6 (six) hours as needed   losartan (COZAAR) 100 MG tablet Take 100 mg by mouth daily.  potassium chloride SA Take 2 tablets (40 mEq total) every morning.  pravastatin 10 MG tablet Take 10 mg by mouth daily.  sertraline (ZOLOFT) 100 MG tablet Take 150 mg by mouth at bedtime.  warfarin (COUMADIN) 4 MG tablet Take as directed by [facility]  zolpidem (AMBIEN) 10 MG tablet Take 5 mg  at bedtime as needed for  sleep.  methocarbamol 500 MG tablet Take 1 tab every 6 (six) hours as needed   OxyCODONE 10 mg T12A 12 hr tablet Take 1-2 tablets BID   Physical Exam: Blood pressure 108/59, pulse 91, temperature 99.6 F (37.6 C), temperature source Oral, resp. rate 17, height 5\' 7"  (1.702 m), SpO2 91 %. Filed Vitals:   11/07/14 0524  BP: 108/59  Pulse: 91  Temp: 99.6 F (37.6 C)  Resp: 17   Physical Exam  Constitutional: Appears well-developed and well-nourished. No distress.  HENT: Normocephalic. External right and left ear normal. Oropharynx is clear and moist.  Eyes: Conjunctivae and EOM are normal. PERRLA, no scleral icterus.  Neck: Normal ROM. Neck supple. No JVD. No tracheal deviation. No  thyromegaly.  CVS: IRRR, S1/S2 +, no gallops, no carotid bruit.  Pulmonary: Effort and breath sounds normal, no stridor, rhonchi, wheezes, rales.  Abdominal: Soft. BS +,  no distension, tenderness, rebound or guarding.  Musculoskeletal: Normal range of motion. No tenderness.  Lymphadenopathy: No lymphadenopathy noted, cervical, inguinal. Neuro: Alert. Normal reflexes, muscle tone coordination. No cranial nerve deficit. Skin: Skin is warm and dry. No rash noted. Not diaphoretic. No erythema. No pallor.  Psychiatric: Normal mood and affect. Behavior, judgment, thought content normal.    Labs on Admission:  Basic Metabolic Panel:  Recent Labs Lab 11/05/14 0621 11/06/14 0420 11/07/14 0515  NA 140 136 135  K 4.0 4.4 5.0  CL 105 103 103  CO2 27 25 28   GLUCOSE 107* 141* 151*  BUN 19 17 24*  CREATININE 0.79 0.81 0.92  CALCIUM 9.1 8.4* 8.8*   Liver Function Tests:  Recent Labs Lab 11/05/14 0621  AST 20  ALT 18  ALKPHOS 77  BILITOT 0.7  PROT 7.3  ALBUMIN 4.1   CBC:  Recent Labs Lab 11/06/14 0420 11/07/14 0515  WBC 13.5* 11.0*  HGB 10.2* 9.3*  HCT 30.8* 28.9*  MCV 94.2 92.3  PLT 255 206   CBG:  Recent Labs Lab 11/07/14 0619  GLUCAP 145*    Radiological Exams on Admission: No results found.  EKG: not done  Time spent: 60 minutes  MAGICK-Junnie Loschiavo [facility] Pager [phone]  If 7PM-7AM, please contact night-coverage [email or web address] Password [facility] 11/07/2014, 9:16 AM

## 2014-11-07 NOTE — Progress Notes (Signed)
ANTICOAGULATION CONSULT NOTE - Initial Consult  Pharmacy Consult:  Coumadin Indication:  History of Afib + history of mechanical AVR + VTE prophylaxis post left TKA  Allergies  Allergen Reactions  . Codeine Anaphylaxis    jerking   . Beta Adrenergic Blockers     Makes her crazy   . Metoprolol     depression  . Propoxyphene Other (See Comments)    Bad vivid dreams  . Toprol Xl [Metoprolol Tartrate]     depression  . Dilantin [Phenytoin Sodium Extended] Rash and Itching    Patient Measurements: Height: 5\' 7"  (170.2 cm) IBW/kg (Calculated) : 61.6  Vital Signs: Temp: 99.6 F (37.6 C) (07/31 0524) Temp Source: Oral (07/31 0524) BP: 108/59 mmHg (07/31 0524) Pulse Rate: 91 (07/31 0524)  Labs:  Recent Labs  11/05/14 0621 11/06/14 0420 11/07/14 0515  HGB  --  10.2* 9.3*  HCT  --  30.8* 28.9*  PLT  --  255 206  APTT 35  --   --   LABPROT 26.4* 36.2* 52.1*  INR 2.47* 3.74* 6.08*  CREATININE 0.79 0.81 0.92    Estimated Creatinine Clearance: 71.2 mL/min (by C-G formula based on Cr of 0.92).    Assessment: 30 YOF to continue on Coumadin from PTA for history of Afib, mechanical AVR and VTE prophylaxis s/p left TKA 11/05/14.  Noted from patient's PMH that she has a history of subdural hematoma post AVR when her INR was 2.2.  Unsure why INR is trending up further.  No bleeding reported.   Goal of Therapy:  INR 2 - 2.5 per PMH    Plan:  - Continue to hold Coumadin today - Daily PT / INR - Consider removing KCL from IVF   Mliss Wedin D. Mina Marble, PharmD, BCPS Pager:  [phone] 11/07/2014, 10:30 AM

## 2014-11-07 NOTE — Progress Notes (Signed)
PT Cancellation Note  Patient Details Name: [name] MRN: [id number] DOB: [birth year]   Cancelled Treatment:    Reason Eval/Treat Not Completed: Medical issues which prohibited therapy (current INR >6 and await repeat to determine medical stability for therapy)   Lanetta Inch [facility] 11/07/2014, 8:24 AM Elwyn Reach, [place]

## 2014-11-07 NOTE — Progress Notes (Addendum)
Telephone consult with Dr. Doyle Askew [facility], recommends monitoring pt, if evidence of active bleeding occurs can administer Vit K 5mg  x1 dose, otherwise observation only recommended with daily labs. Pt has had ancef total 6 doses, yesterday was last dose and D/C'd  [phone]  Pts Cardiologist is Golden Hurter, previous recommendation for 2-2.5 range

## 2014-11-07 NOTE — Progress Notes (Signed)
    Patient doing well, appears much more comfortable today, has been up with therapy yesterday and making good progress, plan was to D/C home today. Received call this am about elevated PT/INR 52.1/6.08. Pt has been on long term coumadin per cardiologist Dr Fransico Him with 2-2.5 dose range secondary to history of subdural hematoma with elevation of PT/INR in past. I did reach out to medicine service who recommended observation and administration of 5mg  K if active bleeding present. I also called and left message with Dr. [name] office after examining pt. Awaiting response.   Physical Exam: Filed Vitals:   11/07/14 0524  BP: 108/59  Pulse: 91  Temp: 99.6 F (37.6 C)  Resp: 17    Intake/Output Summary (Last 24 hours) at 11/07/14 0959 Last data filed at 11/07/14 0700  Gross per 24 hour  Intake    650 ml  Output    250 ml  Net    400 ml   CBC Latest Ref Rng 11/07/2014 11/06/2014 10/26/2014  WBC 4.0 - 10.5 K/uL 11.0(H) 13.5(H) 6.7  Hemoglobin 12.0 - 15.0 g/dL 9.3(L) 10.2(L) 13.4  Hematocrit 36.0 - 46.0 % 28.9(L) 30.8(L) 38.6  Platelets 150 - 400 K/uL 206 255 285    CMP Latest Ref Rng 11/07/2014 11/06/2014 11/05/2014  Glucose 65 - 99 mg/dL 151(H) 141(H) 107(H)  BUN 6 - 20 mg/dL 24(H) 17 19  Creatinine 0.44 - 1.00 mg/dL 0.92 0.81 0.79  Sodium 135 - 145 mmol/L 135 136 140  Potassium 3.5 - 5.1 mmol/L 5.0 4.4 4.0  Chloride 101 - 111 mmol/L 103 103 105  CO2 22 - 32 mmol/L 28 25 27   Calcium 8.9 - 10.3 mg/dL 8.8(L) 8.4(L) 9.1  Total Protein 6.5 - 8.1 g/dL - - 7.3  Total Bilirubin 0.3 - 1.2 mg/dL - - 0.7  Alkaline Phos 38 - 126 U/L - - 77  AST 15 - 41 U/L - - 20  ALT 14 - 54 U/L - - 18   PT 52.1, yesterday 36.2, 2d 26.4 INR 6.08 yesterday 3.74, 2d 2.47 Pending repeat   Pt appears much more comfortable today, sitting up in chair and eager to go home, no substantial swelling about knee, no hematoma, no evidence of active bleeding, Dressing in place NVI  POD #2 s/p L TKR per Dr  Berenice Primas  -Pt pain is much better controlled, will maintain IV dilaudid to 1 PRN, will D/C on Norco and robaxin and add on ocycontin q12 for 5 days - Ice and ace wrap for compression and cold for swelling -Provide ace for home use - up with PT/OT, encourage ambulation -Cont CPM, has home unit -HH ordered -Coumadin has been held since 29th, cont to hold pt has received 6 total doses of ancef, LFT's are WNL, pending repeat PT/INR for elevation   -Awaiting call back from pts cardiologist   -Hx of subdural hematoma with elev PT/INR  -Hospitalist rec observation and hold K admin unless active bleeding present - d/c home pending PT/INR reduction

## 2014-11-07 NOTE — Progress Notes (Signed)
Contacted by primary team regarding patient's anticoagulation. She is on coumadin for history of mechanical AV and afib. History in 2006 of subdural hematoma, her INRs have been monitored closely since. In hospital elevated INRs to 6, trending down with coumadin on hold, coumadin dosing by pharmacy. I would not recommend vitamin K at this time in the absence of bleeding, the concern would be difficulty in reobtaining therapeutic INR after reversal in setting of mechanical AV and afib. Would continue to hold coumadin per pharmacy.   Zandra Abts MD

## 2014-11-07 NOTE — Progress Notes (Signed)
OT Cancellation Note  Patient Details Name: [name] MRN: [id number] DOB: [birth year]   Cancelled Treatment:    Reason Eval/Treat Not Completed: Medical issues which prohibited therapy  Benito Mccreedy OTR/L [phone] 11/07/2014, 1:26 PM

## 2014-11-07 NOTE — Progress Notes (Signed)
Critical value PT 52.1/INR 6.08.  Notified Pricilla Holm, [place]. Verbal order of repeat PT/INR stat. Medical consult will be ordered

## 2014-11-07 NOTE — Progress Notes (Signed)
PT Cancellation Note  Patient Details Name: [name] MRN: [id number] DOB: May 26, 1945   Cancelled Treatment:    Reason Eval/Treat Not Completed: Medical issues which prohibited therapy (INR remains high (5.90)).  Will continue to follow pt acutely.    Joslyn Hy PT, DPT [phone] Pager: [phone] 11/07/2014, 1:59 PM

## 2014-11-07 NOTE — Progress Notes (Addendum)
    I did reach out to the patients cardiology office and Dr Harl Bowie is going to come and evaluate the pt for elevated PT/INR. We will hold pt and await his recommendations. Ancef has been D/C'd and coumadin has been held.   Pricilla Holm, PA-C [phone]  Dr Harl Bowie [phone]

## 2014-11-08 ENCOUNTER — Encounter (HOSPITAL_COMMUNITY): Payer: Self-pay | Admitting: Orthopedic Surgery

## 2014-11-08 ENCOUNTER — Telehealth: Payer: Self-pay | Admitting: Cardiology

## 2014-11-08 DIAGNOSIS — Z7901 Long term (current) use of anticoagulants: Secondary | ICD-10-CM

## 2014-11-08 DIAGNOSIS — I4891 Unspecified atrial fibrillation: Secondary | ICD-10-CM

## 2014-11-08 LAB — COMPREHENSIVE METABOLIC PANEL
ALBUMIN: 3.1 g/dL — AB (ref 3.5–5.0)
ALT: 5 U/L — AB (ref 14–54)
ANION GAP: 6 (ref 5–15)
AST: 18 U/L (ref 15–41)
Alkaline Phosphatase: 68 U/L (ref 38–126)
BUN: 26 mg/dL — AB (ref 6–20)
CALCIUM: 8.9 mg/dL (ref 8.9–10.3)
CO2: 27 mmol/L (ref 22–32)
Chloride: 102 mmol/L (ref 101–111)
Creatinine, Ser: 0.93 mg/dL (ref 0.44–1.00)
GFR calc Af Amer: >60 mL/min (ref >60)
GFR calc non Af Amer: >60 mL/min (ref >60)
Glucose, Bld: 129 mg/dL — ABNORMAL HIGH (ref 65–99)
Potassium: 4.7 mmol/L (ref 3.5–5.1)
Sodium: 135 mmol/L (ref 135–145)
TOTAL PROTEIN: 6.6 g/dL (ref 6.5–8.1)
Total Bilirubin: 0.6 mg/dL (ref 0.3–1.2)

## 2014-11-08 LAB — CBC
HCT: 27.9 % — ABNORMAL LOW (ref 36.0–46.0)
Hemoglobin: 8.9 g/dL — ABNORMAL LOW (ref 12.0–15.0)
MCH: 30.2 pg (ref 26.0–34.0)
MCHC: 31.9 g/dL (ref 30.0–36.0)
MCV: 94.6 fL (ref 78.0–100.0)
PLATELETS: 202 10*3/uL (ref 150–400)
RBC: 2.95 MIL/uL — ABNORMAL LOW (ref 3.87–5.11)
RDW: 13.9 % (ref 11.5–15.5)
WBC: 11.5 10*3/uL — AB (ref 4.0–10.5)

## 2014-11-08 LAB — PROTIME-INR
INR: 1.35 (ref 0.00–1.49)
PROTHROMBIN TIME: 16.8 s — AB (ref 11.6–15.2)

## 2014-11-08 LAB — HEPARIN LEVEL (UNFRACTIONATED)

## 2014-11-08 MED ORDER — OXYCODONE-ACETAMINOPHEN 5-325 MG PO TABS: 1.0000 | ORAL_TABLET | Freq: Four times a day (QID) | ORAL | Status: DC | PRN

## 2014-11-08 MED ORDER — HEPARIN BOLUS VIA INFUSION
4000.0000 [IU] | Freq: Once | INTRAVENOUS | Status: AC
  Administered 2014-11-08: 4000 [IU] via INTRAVENOUS
  Filled 2014-11-08: qty 4000

## 2014-11-08 MED ORDER — HEPARIN BOLUS VIA INFUSION
2500.0000 [IU] | Freq: Once | INTRAVENOUS | Status: AC
  Administered 2014-11-08: 2500 [IU] via INTRAVENOUS
  Filled 2014-11-08: qty 2500

## 2014-11-08 MED ORDER — WARFARIN SODIUM 6 MG PO TABS
6.0000 mg | ORAL_TABLET | Freq: Once | ORAL | Status: AC
  Administered 2014-11-08: 6 mg via ORAL
  Filled 2014-11-08: qty 1

## 2014-11-08 MED ORDER — HEPARIN (PORCINE) IN NACL 100-0.45 UNIT/ML-% IJ SOLN
2150.0000 [IU]/h | INTRAMUSCULAR | Status: DC
  Administered 2014-11-08: 1200 [IU]/h via INTRAVENOUS
  Administered 2014-11-09: 1450 [IU]/h via INTRAVENOUS
  Administered 2014-11-09 – 2014-11-11 (×3): 1800 [IU]/h via INTRAVENOUS
  Administered 2014-11-11: 2300 [IU]/h via INTRAVENOUS
  Administered 2014-11-12 – 2014-11-13 (×2): 2150 [IU]/h via INTRAVENOUS
  Filled 2014-11-08 (×12): qty 250

## 2014-11-08 NOTE — Progress Notes (Signed)
OT Cancellation Note  Patient Details Name: [name] MRN: [id number] DOB: 08-18-1945   Cancelled Treatment:    Reason Eval/Treat Not Completed: Other (comment). Pt not wanting OT this afternoon and verbalized frustrations she has had during this hospital stay and has had a stressful day. Will try to see in am.  Benito Mccreedy OTR/L [phone] 11/08/2014, 4:44 PM

## 2014-11-08 NOTE — Progress Notes (Signed)
Pt is on CPAP 12cmH2O at this time. RN aware, pt is stable no signs of distress noted. Pt tolerating well. Pt also stated that she wears 12 of pressure at home for CPAP therapy

## 2014-11-08 NOTE — Progress Notes (Signed)
Pt tolerating well RN aware

## 2014-11-08 NOTE — Discharge Instructions (Signed)

## 2014-11-08 NOTE — Progress Notes (Signed)
PROGRESS NOTE  [name] TDV:[id number] DOB: 1945/10/10 DOA: 11/05/2014 PCP: Jonathon Bellows, MD   HPI: Patient is 69 year old female with known history of advanced left knee arthritis, failed nonsurgical conservative treatment which included anti-inflammatory medications, analgesia 6, corticosteroid-induced injections, presented to [facility] on 11/05/2014 with progressively worsening left knee pain, 8/10 in severity, occasionally but not consistently radiating to the entire extremity, worse with movement and weightbearing, no specific alleviating factors. Orthopedic team admitted patient for left TKR. Patient is now postop day 2 status post L TKR.  Subjective / 24 H Interval events - no complaints, no chest pain, dyspnea, doing well post op   Assessment/Plan: Active Problems:   Atrial fibrillation   Essential hypertension   Chronic anticoagulation   S/P AVR (aortic valve replacement)   Morbid obesity   DJD (degenerative joint disease) of knee   Primary osteoarthritis of left knee   Elevated INR   Elevated INR in patient with A fib and mechanical valve - likely in the setting of antibiotics per op and Coumadin - patient did receive vitamin K yesterday per Dr. Doyle Askew, INR dropped to 1.35 this morning - I have spoken with Dr. Radford Pax, patient's primary cardiologist this morning, given history of SDH would not use Lovenox but instead heparin bridging - cardiology yesterday recommended against Vitamin K, but that was after vitamin K was administered - I have discussed with patient and explained what happened in detail and have answered all questions, she has no concerns and will complete the bridging with IV heparin DJD (degenerative joint disease) of knee - Postop day #2, status post left TKR - Management per primary team Atrial fibrillation, rate controlled, CHADS score 3-4 - anticoagulation as above - Rate controlled, continue Cardizem - Continue Tikosyn Anemia of chronic  disease - No signs of active bleeding for now, monitor CBC while inpatient  Diet: Diet regular Room service appropriate?: Yes; Fluid consistency:: Thin Diet general  Code Status: Full Code   Antibiotics  Anti-infectives    Start     Dose/Rate Route Frequency Ordered Stop   11/05/14 1200  ceFAZolin (ANCEF) IVPB 2 g/50 mL premix    Comments:  Patient will receive additional dosages of antibiotics due to high risk of post operative hematoma secondary to coumadin status pre/post op   2 g 100 mL/hr over 30 Minutes Intravenous Every 6 hours 11/05/14 1042 11/07/14 1159   11/05/14 0600  ceFAZolin (ANCEF) IVPB 2 g/50 mL premix     2 g 100 mL/hr over 30 Minutes Intravenous To [facility] 11/04/14 1035 11/05/14 0743       Studies  No results found.  Objective  Filed Vitals:   11/07/14 2045 11/07/14 2352 11/08/14 0000 11/08/14 0553  BP: 109/49   95/58  Pulse: 87  84 86  Temp: 98.5 F (36.9 C)   98 F (36.7 C)  TempSrc: Oral   Oral  Resp: 17  14 15   Height:      SpO2: 100% 99% 99% 91%    Intake/Output Summary (Last 24 hours) at 11/08/14 0839 Last data filed at 11/07/14 1741  Gross per 24 hour  Intake    360 ml  Output      0 ml  Net    360 ml   There were no vitals filed for this visit.  Exam:  General:  NAD  HEENT: no scleral icterus, PERRL, no LAD  Cardiovascular: IRRR without MRG, 2+ peripheral pulses, no edema, mechanical valve click; no  JVD  Respiratory: CTA biL, good air movement, no wheezing, no crackles, no rales  GI: non tender, BS +  Skin: no rashes  MSK: left knee s/p AKR, no joint swelling  Neuro: CN2-12 intact, strength 5/5 in all 4  Psych: normal mood and affect  Data Reviewed: Basic Metabolic Panel:  Recent Labs Lab 11/05/14 0621 11/06/14 0420 11/07/14 0515 11/08/14 0520  NA 140 136 135 135  K 4.0 4.4 5.0 4.7  CL 105 103 103 102  CO2 27 25 28 27   GLUCOSE 107* 141* 151* 129*  BUN 19 17 24* 26*  CREATININE 0.79 0.81 0.92  0.93  CALCIUM 9.1 8.4* 8.8* 8.9   Liver Function Tests:  Recent Labs Lab 11/05/14 0621 11/08/14 0520  AST 20 18  ALT 18 5*  ALKPHOS 77 68  BILITOT 0.7 0.6  PROT 7.3 6.6  ALBUMIN 4.1 3.1*   No results for input(s): LIPASE, AMYLASE in the last 168 hours. No results for input(s): AMMONIA in the last 168 hours. CBC:  Recent Labs Lab 11/06/14 0420 11/07/14 0515 11/08/14 0520  WBC 13.5* 11.0* 11.5*  HGB 10.2* 9.3* 8.9*  HCT 30.8* 28.9* 27.9*  MCV 94.2 92.3 94.6  PLT 255 206 202   Cardiac Enzymes: No results for input(s): CKTOTAL, CKMB, CKMBINDEX, TROPONINI in the last 168 hours. BNP (last 3 results) No results for input(s): BNP in the last 8760 hours.  ProBNP (last 3 results)  Recent Labs  06/01/14 1148  PROBNP 56.0    CBG:  Recent Labs Lab 11/07/14 0619  GLUCAP 145*    No results found for this or any previous visit (from the past 240 hour(s)).   Scheduled Meds: . buPROPion  150 mg Oral Daily  . diltiazem  240 mg Oral Daily  . docusate sodium  100 mg Oral BID  . dofetilide  250 mcg Oral BID  . furosemide  80 mg Oral Daily  . HYDROcodone-acetaminophen  1-2 tablet Oral 4 times per day  . losartan  100 mg Oral Daily  . potassium chloride SA  40 mEq Oral BH-q7a  . pravastatin  10 mg Oral Daily  . sertraline  150 mg Oral QHS  . Warfarin - Pharmacist Dosing Inpatient   Does not apply q1800   Continuous Infusions:   Time spent: 25 minutes  Marzetta Board, MD [facility] Pager [phone]. If 7 PM - 7 AM, please contact night-coverage at [email or web address], password [facility] 11/08/2014, 8:39 AM  LOS: 3 days

## 2014-11-08 NOTE — Progress Notes (Signed)
Subjective: 3 Days Post-Op Procedure(s) (LRB): TOTAL KNEE ARTHROPLASTY (Left) Patient reports pain as mild.  No chest pain or shortness of breath. Taking by mouth and voiding okay.  Objective: Vital signs in last 24 hours: Temp:  [98 F (36.7 C)-98.8 F (37.1 C)] 98 F (36.7 C) (08/01 0553) Pulse Rate:  [84-88] 86 (08/01 0553) Resp:  [14-17] 15 (08/01 0553) BP: (95-117)/(49-63) 95/58 mmHg (08/01 0553) SpO2:  [91 %-100 %] 91 % (08/01 0553)  Intake/Output from previous day: 07/31 0701 - 08/01 0700 In: 360 [P.O.:360] Out: -  Intake/Output this shift:     Recent Labs  11/06/14 0420 11/07/14 0515 11/08/14 0520  HGB 10.2* 9.3* 8.9*    Recent Labs  11/07/14 0515 11/08/14 0520  WBC 11.0* 11.5*  RBC 3.13* 2.95*  HCT 28.9* 27.9*  PLT 206 202    Recent Labs  11/07/14 0515 11/08/14 0520  NA 135 135  K 5.0 4.7  CL 103 102  CO2 28 27  BUN 24* 26*  CREATININE 0.92 0.93  GLUCOSE 151* 129*  CALCIUM 8.8* 8.9    Recent Labs  11/07/14 0854 11/08/14 0520  INR 5.90* 1.35   Left knee exam: Neurovascular intact Intact pulses distally Dorsiflexion/Plantar flexion intact Incision: dressing C/D/I No cellulitis present Compartment soft  Assessment/Plan: 3 Days Post-Op Procedure(s) (LRB): TOTAL KNEE ARTHROPLASTY (Left) Plan: Up with therapy Discharge home with home health when okay with cardiology. Will need to resume usual Coumadin dose. Weight-bear as tolerated on left.  [place] G 11/08/2014, 8:16 AM

## 2014-11-08 NOTE — Progress Notes (Signed)
Physical Therapy Treatment Patient Details Name: [name] MRN: [id number] DOB: April 03, 1946 Today's Date: 11/08/2014    History of Present Illness Pt is s/p L TKA.  Pt's PMH includes morbid obesity, CHF, a fib, HTN, anxiety and depression.    PT Comments    Pt became very anxious while ambulating, limiting her ambulatory distance this session (see notes below).  Therapeutic exercises complete in recliner chair and pt in good spirits at end of session.  Pt will benefit from continued skilled PT services to increase functional independence and safety.   Follow Up Recommendations  Home health PT;Supervision/Assistance - 24 hour     Equipment Recommendations  None recommended by PT    Recommendations for Other Services       Precautions / Restrictions Precautions Precautions: Fall;Knee Precaution Comments: reviewed knee precautions Required Braces or Orthoses: Knee Immobilizer - Left Knee Immobilizer - Left: Discontinue once straight leg raise with < 10 degree lag Restrictions Weight Bearing Restrictions: Yes LLE Weight Bearing: Weight bearing as tolerated    Mobility  Bed Mobility               General bed mobility comments: W/ nurse tech ambulating to bathroom upon PT arrival  Transfers Overall transfer level: Needs assistance Equipment used: Rolling walker (2 wheeled) Transfers: Sit to/from Stand Sit to Stand: Min guard         General transfer comment: Cues to reach back to armrests when sitting down.  Ambulation/Gait Ambulation/Gait assistance: Min guard Ambulation Distance (Feet): 30 Feet Assistive device: Rolling walker (2 wheeled) Gait Pattern/deviations: Step-to pattern;Antalgic;Trunk flexed;Decreased weight shift to left;Decreased stride length;Decreased stance time - left   Gait velocity interpretation: Below normal speed for age/gender General Gait Details: Dec gait speed w/ trunk flexed.  Very close min guard as pt started to become very  anxious after ambulating 25 ft saying, "I just want to get out of here" and having what appeared to be an anxiety attack. (see general notes below for more info)   Stairs            Wheelchair Mobility    Modified Rankin (Stroke Patients Only)       Balance Overall balance assessment: Needs assistance Sitting-balance support: No upper extremity supported;Feet supported Sitting balance-Leahy Scale: Fair     Standing balance support: Bilateral upper extremity supported;During functional activity Standing balance-Leahy Scale: Poor Standing balance comment: relies on RW for support                    Cognition Arousal/Alertness: Awake/alert Behavior During Therapy: Anxious Overall Cognitive Status: Within Functional Limits for tasks assessed                      Exercises Total Joint Exercises Ankle Circles/Pumps: AROM;Both;10 reps;Seated Quad Sets: AROM;Both;10 reps;Seated Long Arc Quad: AROM;AAROM;Left;5 reps;Seated Knee Flexion: AROM;Left;5 reps;Seated;AAROM Goniometric ROM: 0-82     General Comments General comments (skin integrity, edema, etc.): As descrbied in ambulation notes pt became very anxious while ambulating.  After speaking further w/ the pt once she was comfortable in the chair she reported that  she "didn't expect to be here for so long" and it "makes me upset".  Pt consoled and asked if she had any needs that PT could address.  Applied ice to L knee and replenished drinking water for pt.  Pt in good spirits laughing and smiling at end of session.      Pertinent Vitals/Pain Pain Assessment: 0-10  Pain Score: 2  Pain Location: L knee Pain Descriptors / Indicators: Discomfort;Grimacing;Moaning Pain Intervention(s): Limited activity within patient's tolerance;Monitored during session;Repositioned;Ice applied    Home Living                      Prior Function            PT Goals (current goals can now be found in the care  plan section) Acute Rehab PT Goals Patient Stated Goal: to get stronger so she can go home PT Goal Formulation: With patient Time For Goal Achievement: 11/12/14 Potential to Achieve Goals: Good Progress towards PT goals: Progressing toward goals    Frequency  7X/week    PT Plan Current plan remains appropriate    Co-evaluation             End of Session Equipment Utilized During Treatment: Gait belt (L KI not on when PT arrived in room) Activity Tolerance: Patient limited by fatigue;Other (comment) (limited by anxiety) Patient left: in chair;with call bell/phone within reach;with family/visitor present     Time: 6389-3734 PT Time Calculation (min) (ACUTE ONLY): 41 min  Charges:  $Gait Training: 8-22 mins $Therapeutic Exercise: 8-22 mins $Therapeutic Activity: 8-22 mins                    G Codes:      Joslyn Hy PT, [place] [phone] Pager: [phone] 11/08/2014, 10:28 AM

## 2014-11-08 NOTE — Telephone Encounter (Signed)
New message      [facility] is [facility] -----just had a knee replacement last Friday.  She has a question regarding if Dr Radford Pax has been called to answer some questions regarding her coumadin.

## 2014-11-08 NOTE — Care Management Important Message (Signed)
Important Message  Patient Details  Name: [name] MRN: [id number] Date of Birth: [birth year]   Medicare Important Message Given:  Yes-second notification given    Delorse Lek 11/08/2014, 12:13 PM

## 2014-11-08 NOTE — Progress Notes (Signed)
ANTICOAGULATION CONSULT NOTE - Follow Up Consult  Pharmacy Consult for heparin and warfarin Indication: mechanical valve and AFib  Allergies  Allergen Reactions  . Codeine Anaphylaxis    jerking   . Beta Adrenergic Blockers     Makes her crazy   . Metoprolol     depression  . Propoxyphene Other (See Comments)    Bad vivid dreams  . Toprol Xl [Metoprolol Tartrate]     depression  . Dilantin [Phenytoin Sodium Extended] Rash and Itching    Patient Measurements: Height: 5\' 7"  (170.2 cm) IBW/kg (Calculated) : 61.6 Heparin Dosing Weight: 85kg  Vital Signs: Temp: 98 F (36.7 C) (08/01 0553) Temp Source: Oral (08/01 0553) BP: 95/58 mmHg (08/01 0553) Pulse Rate: 86 (08/01 0553)  Labs:  Recent Labs  11/06/14 0420 11/07/14 0515 11/07/14 0854 11/08/14 0520  HGB 10.2* 9.3*  --  8.9*  HCT 30.8* 28.9*  --  27.9*  PLT 255 206  --  202  LABPROT 36.2* 52.1* 50.9* 16.8*  INR 3.74* 6.08* 5.90* 1.35  CREATININE 0.81 0.92  --  0.93    Estimated Creatinine Clearance: 70.5 mL/min (by C-G formula based on Cr of 0.93).  Assessment: 35 YOF to continue on Coumadin from PTA for history of Afib and mechanical valve. She is s/p left TKA 11/05/14.  Noted from patient's PMH/AC clinic notes that she has a history of subdural hematoma post AVR while her INR was 2.4, therefore her INR goal is lower than normal for a mechanical aortic valve. Patient's INR increased to 6 while on her home dose for unknown reasons. She did receive vitamin K. INR today is down to 1.35, and she is now to start heparin bridge.  Hgb down to 8.9, plts 202. No overt bleeding noted.   Home dose: 4mg  daily except 6mg  on Mondays and Fridays.  Goal of Therapy:  INR 2-2.5 Monitor platelets by anticoagulation protocol: Yes   Plan:  -heparin bolus with 4000 units IV x1, then start infusion at 1200 units/hr -heparin level in 6 hours -warfarin 6mg  po x1 tonight as per home dose -daily INR/HL/CBC -follow for s/s  bleeding  Sohrab Keelan D. Sharlynn Seckinger, PharmD, BCPS Clinical Pharmacist Pager: [phone] 11/08/2014 9:01 AM

## 2014-11-08 NOTE — Telephone Encounter (Signed)
Patient upset because she "does not know who is in charge, Dr. Radford Pax or the doctors at the hospital." Informed the patient that the doctors on her case at the hospital are "in charge" because they are monitoring her VS and labwork closely. Patient concerned her BP is too low (90/50) and does not want to take her BP medication.  Informed the patient she is allowed to refuse a medication, and instructed her to have the nurse check BP before administering her medications. Patient agrees with treatment plan.

## 2014-11-08 NOTE — Progress Notes (Signed)
Patient states she will self administered when she is ready. Sterile water added to water chamber for humidification. RT will continue to monitor.

## 2014-11-08 NOTE — Progress Notes (Signed)
Physical Therapy Treatment Patient Details Name: [name] MRN: [id number] DOB: 1945-09-20 Today's Date: 11/08/2014    History of Present Illness Pt is s/p L TKA.  Pt's PMH includes morbid obesity, CHF, a fib, HTN, anxiety and depression.    PT Comments    Pt continues to make modest progress w/ ambulation and therapeutic exercises.  Pt will benefit from continued skilled PT services to increase functional independence and safety.  Follow Up Recommendations  Home health PT;Supervision/Assistance - 24 hour     Equipment Recommendations  None recommended by PT    Recommendations for Other Services       Precautions / Restrictions Precautions Precautions: Fall;Knee Precaution Comments: reviewed knee precautions Required Braces or Orthoses: Knee Immobilizer - Left Knee Immobilizer - Left: Discontinue once straight leg raise with < 10 degree lag Restrictions Weight Bearing Restrictions: Yes LLE Weight Bearing: Weight bearing as tolerated    Mobility  Bed Mobility Overal bed mobility: Needs Assistance Bed Mobility: Supine to Sit;Sit to Supine     Supine to sit: Min assist Sit to supine: Min assist   General bed mobility comments: Min assist using bed pad to scoot pt's pelvis to sitting EOB.  Min assist managing BLE during sit>supine.  Transfers Overall transfer level: Needs assistance Equipment used: Rolling walker (2 wheeled) Transfers: Sit to/from Stand Sit to Stand: Min guard         General transfer comment: Min verbal cues for technique to perform descent slowly to bed.  Pt w/ good technique otherwise.  Ambulation/Gait Ambulation/Gait assistance: Min guard Ambulation Distance (Feet): 60 Feet Assistive device: Rolling walker (2 wheeled) Gait Pattern/deviations: Step-to pattern;Antalgic;Trunk flexed;Decreased weight shift to left;Decreased stride length;Decreased stance time - left   Gait velocity interpretation: Below normal speed for  age/gender General Gait Details: Dec gait speed w/ trunk flexed.  5 standing rest breaks 2/2 pt fatigue, c/o faitgue mainly in [place] 2/2 increased WB through RW.    Stairs            Wheelchair Mobility    Modified Rankin (Stroke Patients Only)       Balance Overall balance assessment: Needs assistance Sitting-balance support: Bilateral upper extremity supported;Feet supported Sitting balance-Leahy Scale: Fair     Standing balance support: Bilateral upper extremity supported;During functional activity Standing balance-Leahy Scale: Poor Standing balance comment: relies on RW for support                    Cognition Arousal/Alertness: Awake/alert Behavior During Therapy: Anxious Overall Cognitive Status: Within Functional Limits for tasks assessed                      Exercises Total Joint Exercises Quad Sets: AROM;Both;10 reps;Supine Gluteal Sets: Strengthening;Both;10 reps;Supine Heel Slides: AROM;Left;5 reps;Supine    General Comments        Pertinent Vitals/Pain Pain Assessment: 0-10 Pain Score: 4  Pain Location: L knee Pain Descriptors / Indicators: Aching;Discomfort;Grimacing Pain Intervention(s): Limited activity within patient's tolerance;Monitored during session;Repositioned    Home Living                      Prior Function            PT Goals (current goals can now be found in the care plan section) Acute Rehab PT Goals Patient Stated Goal: to get stronger so she can go home PT Goal Formulation: With patient Time For Goal Achievement: 11/12/14 Potential to Achieve Goals: Good Progress  towards PT goals: Progressing toward goals (modestly)    Frequency  7X/week    PT Plan Current plan remains appropriate    Co-evaluation             End of Session Equipment Utilized During Treatment: Gait belt Activity Tolerance: Patient limited by fatigue Patient left: with call bell/phone within reach;in CPM;in bed      Time: 0786-7544 PT Time Calculation (min) (ACUTE ONLY): 30 min  Charges:  $Gait Training: 8-22 mins $Therapeutic Exercise: 8-22 mins                    G Codes:      [name] PT, [place] [phone] Pager: [phone] 11/08/2014, 3:07 PM

## 2014-11-08 NOTE — Progress Notes (Signed)
PHARMACY NOTE  Pharmacy Consult :  69 y.o. female is currently on Coumadin with Heparin bridging.  Indication :  mechanical valve and AFib  Heparin Dosing Wt :  85 kg  LABS :  Recent Labs  11/06/14 0420 11/07/14 0515 11/07/14 0854 11/08/14 0520 11/08/14 1700  HGB 10.2* 9.3*  --  8.9*  --   HCT 30.8* 28.9*  --  27.9*  --   PLT 255 206  --  202  --   LABPROT 36.2* 52.1* 50.9* 16.8*  --   INR 3.74* 6.08* 5.90* 1.35  --   HEPARINUNFRC  --   --   --   --  <0.10*  CREATININE 0.81 0.92  --  0.93  --     MEDICATION: Medication PTA: Prescriptions prior to admission  Medication Sig Dispense Refill Last Dose  . Ascorbic Acid (VITAMIN C) 1000 MG tablet Take 2,000 mg by mouth daily.   Past Week at Unknown time  . buPROPion (WELLBUTRIN XL) 150 MG 24 hr tablet Take 150 mg by mouth daily.   11/05/2014 at 0500  . calcium-vitamin D (OSCAL WITH D) 500-200 MG-UNIT per tablet Take 2 tablets by mouth daily.    Past Week at Unknown time  . Cholecalciferol (VITAMIN D) 2000 UNITS tablet Take 4,000 Units by mouth daily.   Past Week at Unknown time  . diltiazem (TIAZAC) 240 MG 24 hr capsule Take 240 mg by mouth daily.   11/05/2014 at 0500  . dofetilide (TIKOSYN) 250 MCG capsule Take 250 mcg by mouth 2 (two) times daily.   11/05/2014 at 0500  . furosemide (LASIX) 80 MG tablet TAKE 1 TABLET BY MOUTH EVERY DAY 90 tablet 1 11/04/2014 at Unknown time  . HYDROcodone-acetaminophen (NORCO/VICODIN) 5-325 MG per tablet Take 1 tablet by mouth every 6 (six) hours as needed for moderate pain.   0 Past Week at Unknown time  . losartan (COZAAR) 100 MG tablet Take 100 mg by mouth daily.   11/05/2014 at 0500  . Multiple Vitamin (MULTIVITAMIN WITH MINERALS) TABS Take 1 tablet by mouth daily.   Past Week at Unknown time  . potassium chloride SA (K-DUR,KLOR-CON) 20 MEQ tablet Take 2 tablets (40 mEq total) by mouth every morning. Take one tablet (20 mEq total) by mouth every evening. 270 tablet 3  Past Week at Unknown time  . pravastatin (PRAVACHOL) 10 MG tablet Take 10 mg by mouth daily.   11/04/2014 at Unknown time  . sertraline (ZOLOFT) 100 MG tablet Take 150 mg by mouth at bedtime.   11/04/2014 at Unknown time  . tretinoin (RETIN-A) 0.05 % cream Apply 1 application topically at bedtime.    11/04/2014 at Unknown time  . warfarin (COUMADIN) 4 MG tablet Take as directed by [facility]in Clinic 120 tablet 0 11/04/2014 at Unknown time  . zolpidem (AMBIEN) 10 MG tablet Take 5 mg by mouth at bedtime as needed for sleep.   11/04/2014 at Unknown time   Scheduled:  Scheduled:  . buPROPion  150 mg Oral Daily  . diltiazem  240 mg Oral Daily  . docusate sodium  100 mg Oral BID  . dofetilide  250 mcg Oral BID  . furosemide  80 mg Oral Daily  . HYDROcodone-acetaminophen  1-2 tablet Oral 4 times per day  . losartan  100 mg Oral Daily  . potassium chloride SA  40 mEq Oral BH-q7a  . pravastatin  10 mg Oral Daily  . sertraline  150 mg Oral QHS  . Warfarin -  Pharmacist Dosing Inpatient   Does not apply q1800   Infusion[s]: Infusions:  . heparin 1,200 Units/hr (11/08/14 1023)   ASSESSMENT :  69 y.o. female is currently on Coumadin with Heparin bridging for a mechanical valve and AFib   Heparin infusing at 1200 units/hr. Heparin level Sub-therapeutic at < 0.1 units/ml.  No interruptions in the infusion reported.  No evidence of bleeding complications observed.  GOAL :  Heparin Level  0.3 - 0.7 units/ml  TARGET INR 2 - 2.5   PLAN : 1. Heparin bolus 2500 units IV now, then increase Heparin infusion to 1450 units/hr.  The next Heparin Level will be drawn with the AM labs.    2. Daily Heparin level, INR, CBC, and Monitor for bleeding complications.  Follow Platlet counts.  Marthenia Rolling,  Pharm.D   11/08/2014,  6:56 PM

## 2014-11-09 LAB — PROTIME-INR
INR: 1.23 (ref 0.00–1.49)
PROTHROMBIN TIME: 15.7 s — AB (ref 11.6–15.2)

## 2014-11-09 LAB — CBC
HCT: 27.6 % — ABNORMAL LOW (ref 36.0–46.0)
HEMOGLOBIN: 8.9 g/dL — AB (ref 12.0–15.0)
MCH: 29.9 pg (ref 26.0–34.0)
MCHC: 32.2 g/dL (ref 30.0–36.0)
MCV: 92.6 fL (ref 78.0–100.0)
PLATELETS: 280 10*3/uL (ref 150–400)
RBC: 2.98 MIL/uL — AB (ref 3.87–5.11)
RDW: 13.8 % (ref 11.5–15.5)
WBC: 10.7 10*3/uL — ABNORMAL HIGH (ref 4.0–10.5)

## 2014-11-09 LAB — HEPARIN LEVEL (UNFRACTIONATED)
HEPARIN UNFRACTIONATED: 0.2 [IU]/mL — AB (ref 0.30–0.70)
Heparin Unfractionated: 0.23 IU/mL — ABNORMAL LOW (ref 0.30–0.70)
Heparin Unfractionated: 0.31 IU/mL (ref 0.30–0.70)

## 2014-11-09 MED ORDER — WARFARIN SODIUM 6 MG PO TABS
6.0000 mg | ORAL_TABLET | Freq: Once | ORAL | Status: AC
  Administered 2014-11-09: 6 mg via ORAL
  Filled 2014-11-09: qty 1

## 2014-11-09 NOTE — Progress Notes (Signed)
ANTICOAGULATION CONSULT NOTE - Follow Up Consult  Pharmacy Consult for Heparin  Indication: atrial fibrillation/AVR  Allergies  Allergen Reactions  . Codeine Anaphylaxis    jerking   . Beta Adrenergic Blockers     Makes her crazy   . Metoprolol     depression  . Propoxyphene Other (See Comments)    Bad vivid dreams  . Toprol Xl [Metoprolol Tartrate]     depression  . Dilantin [Phenytoin Sodium Extended] Rash and Itching    Patient Measurements: Height: 5\' 7"  (170.2 cm) IBW/kg (Calculated) : 61.6  Labs:  Recent Labs  11/07/14 0515 11/07/14 0854 11/08/14 0520  11/09/14 0523 11/09/14 1305 11/09/14 2200  HGB 9.3*  --  8.9*  --  8.9*  --   --   HCT 28.9*  --  27.9*  --  27.6*  --   --   PLT 206  --  202  --  280  --   --   LABPROT 52.1* 50.9* 16.8*  --  15.7*  --   --   INR 6.08* 5.90* 1.35  --  1.23  --   --   HEPARINUNFRC  --   --   --   < > 0.23* 0.20* 0.31  CREATININE 0.92  --  0.93  --   --   --   --   < > = values in this interval not displayed.   Assessment: Therapeutic heparin level after rate increase  Goal of Therapy:  Heparin level 0.3-0.7 units/ml Monitor platelets by anticoagulation protocol: Yes   Plan:  -Continue heparin at 1800 units/hr -HL with AM labs -Daily CBC/HL -Monitor for bleeding  Narda Bonds 11/09/2014,11:44 PM

## 2014-11-09 NOTE — Progress Notes (Addendum)
PROGRESS NOTE  [name] YQM:[id number] DOB: 1945-10-28 DOA: 11/05/2014 PCP: Jonathon Bellows, MD   HPI: Patient is 69 year old female with known history of advanced left knee arthritis, failed nonsurgical conservative treatment which included anti-inflammatory medications, analgesia 6, corticosteroid-induced injections, presented to [facility] on 11/05/2014 with progressively worsening left knee pain, 8/10 in severity, occasionally but not consistently radiating to the entire extremity, worse with movement and weightbearing, no specific alleviating factors. Orthopedic team admitted patient for left TKR.   Subjective / 24 H Interval events - no complaints, no chest pain, dyspnea, doing well post op  Assessment/Plan: Active Problems:   Atrial fibrillation   Essential hypertension   Chronic anticoagulation   S/P AVR (aortic valve replacement)   Morbid obesity   DJD (degenerative joint disease) of knee   Primary osteoarthritis of left knee   Elevated INR   Elevated INR in patient with A fib and mechanical valve - likely in the setting of antibiotics per op and Coumadin - patient did receive vitamin K  - I have spoken with Dr. Radford Pax on 8/1, given history of SDH would not use Lovenox but instead heparin bridging - continue heparin + Coumadin.  - INR still low today  - updated husband bedside today  DJD (degenerative joint disease) of knee - Postop day #2, status post left TKR Atrial fibrillation, rate controlled, CHADS score 3-4 - anticoagulation as above - Rate controlled, continue Cardizem - Continue Tikosyn Anemia of chronic disease - No signs of active bleeding for now, monitor CBC while inpatient  Diet: Diet regular Room service appropriate?: Yes; Fluid consistency:: Thin Diet general  Code Status: Full Code   Antibiotics  Anti-infectives    Start     Dose/Rate Route Frequency Ordered Stop   11/05/14 1200  ceFAZolin (ANCEF) IVPB 2 g/50 mL premix      Comments:  Patient will receive additional dosages of antibiotics due to high risk of post operative hematoma secondary to coumadin status pre/post op   2 g 100 mL/hr over 30 Minutes Intravenous Every 6 hours 11/05/14 1042 11/07/14 1159   11/05/14 0600  ceFAZolin (ANCEF) IVPB 2 g/50 mL premix     2 g 100 mL/hr over 30 Minutes Intravenous To [facility] 11/04/14 1035 11/05/14 0743       Studies  No results found.  Objective  Filed Vitals:   11/08/14 0553 11/08/14 1237 11/08/14 2211 11/09/14 0456  BP: 95/58 121/58 112/60 114/52  Pulse: 86 88 87 84  Temp: 98 F (36.7 C) 98.7 F (37.1 C) 97.3 F (36.3 C) 98.6 F (37 C)  TempSrc: Oral Oral Oral Oral  Resp: 15 18 17 18   Height:      SpO2: 91% 62% 96% 96%    Intake/Output Summary (Last 24 hours) at 11/09/14 1241 Last data filed at 11/09/14 0700  Gross per 24 hour  Intake    240 ml  Output      0 ml  Net    240 ml   There were no vitals filed for this visit.  Exam:  General:  NAD  HEENT: no scleral icterus, PERRL, no LAD  Cardiovascular: IRRR without MRG, 2+ peripheral pulses, no edema, mechanical valve click; no JVD  Respiratory: CTA biL, good air movement, no wheezing, no crackles, no rales  GI: non tender, BS +  Data Reviewed: Basic Metabolic Panel:  Recent Labs Lab 11/05/14 0621 11/06/14 0420 11/07/14 0515 11/08/14 0520  NA 140 136 135 135  K  4.0 4.4 5.0 4.7  CL 105 103 103 102  CO2 27 25 28 27   GLUCOSE 107* 141* 151* 129*  BUN 19 17 24* 26*  CREATININE 0.79 0.81 0.92 0.93  CALCIUM 9.1 8.4* 8.8* 8.9   Liver Function Tests:  Recent Labs Lab 11/05/14 0621 11/08/14 0520  AST 20 18  ALT 18 5*  ALKPHOS 77 68  BILITOT 0.7 0.6  PROT 7.3 6.6  ALBUMIN 4.1 3.1*   No results for input(s): LIPASE, AMYLASE in the last 168 hours. No results for input(s): AMMONIA in the last 168 hours. CBC:  Recent Labs Lab 11/06/14 0420 11/07/14 0515 11/08/14 0520 11/09/14 0523  WBC 13.5* 11.0* 11.5*  10.7*  HGB 10.2* 9.3* 8.9* 8.9*  HCT 30.8* 28.9* 27.9* 27.6*  MCV 94.2 92.3 94.6 92.6  PLT 255 206 202 280   Cardiac Enzymes: No results for input(s): CKTOTAL, CKMB, CKMBINDEX, TROPONINI in the last 168 hours. BNP (last 3 results) No results for input(s): BNP in the last 8760 hours.  ProBNP (last 3 results)  Recent Labs  06/01/14 1148  PROBNP 56.0    CBG:  Recent Labs Lab 11/07/14 0619  GLUCAP 145*    No results found for this or any previous visit (from the past 240 hour(s)).   Scheduled Meds: . buPROPion  150 mg Oral Daily  . diltiazem  240 mg Oral Daily  . docusate sodium  100 mg Oral BID  . dofetilide  250 mcg Oral BID  . furosemide  80 mg Oral Daily  . HYDROcodone-acetaminophen  1-2 tablet Oral 4 times per day  . losartan  100 mg Oral Daily  . potassium chloride SA  40 mEq Oral BH-q7a  . pravastatin  10 mg Oral Daily  . sertraline  150 mg Oral QHS  . warfarin  6 mg Oral ONCE-1800  . Warfarin - Pharmacist Dosing Inpatient   Does not apply q1800   Continuous Infusions: . heparin 1,600 Units/hr (11/09/14 0640)    Marzetta Board, MD [facility] Pager [phone]. If 7 PM - 7 AM, please contact night-coverage at [email or web address], password [facility] 11/09/2014, 12:41 PM  LOS: 4 days

## 2014-11-09 NOTE — Progress Notes (Signed)
Physical Therapy Treatment Patient Details Name: [name] MRN: [id number] DOB: 29-Mar-1946 Today's Date: 11/09/2014    History of Present Illness Pt is s/p L TKA.  Pt's PMH includes morbid obesity, CHF, a fib, HTN, anxiety and depression.    PT Comments    Pt progressing with ambulation. Able to walk without knee immobilizer. Pt fatigues quickly and presents with heavy breathing. Pt seems to lack confidence in herself by saying "I can't do it" but with motivation and encouragement pt was able to complete all requested tasks. Continue with POC.   Follow Up Recommendations  Home health PT;Supervision/Assistance - 24 hour     Equipment Recommendations  None recommended by PT    Recommendations for Other Services       Precautions / Restrictions Precautions Precautions: Fall;Knee Precaution Comments: reviewed knee precautions Required Braces or Orthoses: Knee Immobilizer - Left Knee Immobilizer - Left: Discontinue once straight leg raise with < 10 degree lag Restrictions LLE Weight Bearing: Weight bearing as tolerated    Mobility  Bed Mobility Overal bed mobility: Needs Assistance Bed Mobility: Supine to Sit     Supine to sit: Min assist     General bed mobility comments: Min A managing BLEs off bed during supine>sit.  Transfers Overall transfer level: Needs assistance Equipment used: Rolling walker (2 wheeled) Transfers: Sit to/from Stand Sit to Stand: Min guard         General transfer comment: Min G for safety. Min VCs for technique to descend slowly to chair.  Ambulation/Gait Ambulation/Gait assistance: Min guard Ambulation Distance (Feet): 70 Feet Assistive device: Rolling walker (2 wheeled) Gait Pattern/deviations: Step-through pattern;Decreased step length - right;Decreased stance time - left;Decreased weight shift to left   Gait velocity interpretation: Below normal speed for age/gender General Gait Details: Increase time. Pt able to walk  without knee imobilizer. c/o slight pain in knee and fatigue.   Stairs            Wheelchair Mobility    Modified Rankin (Stroke Patients Only)       Balance                                    Cognition Arousal/Alertness: Awake/alert Behavior During Therapy: WFL for tasks assessed/performed Overall Cognitive Status: Within Functional Limits for tasks assessed                      Exercises Total Joint Exercises Quad Sets: AROM;Both;10 reps;Seated Heel Slides: AROM;Left;10 reps;Seated Hip ABduction/ADduction: AROM;Left;10 reps;Seated Straight Leg Raises: AROM;Left;10 reps;Seated Long Arc Quad: AROM;Left;10 reps;Seated    General Comments        Pertinent Vitals/Pain Pain Score: 4  Pain Location: L knee Pain Descriptors / Indicators: Aching;Grimacing Pain Intervention(s): Monitored during session;Repositioned    Home Living                      Prior Function            PT Goals (current goals can now be found in the care plan section) Progress towards PT goals: Progressing toward goals    Frequency  7X/week    PT Plan Current plan remains appropriate    Co-evaluation             End of Session Equipment Utilized During Treatment: Gait belt Activity Tolerance: Patient tolerated treatment well Patient left: in chair;with call bell/phone within  reach     Time: 3664-4034 PT Time Calculation (min) (ACUTE ONLY): 44 min  Charges:                       G Codes:      Allyn Kenner, SPTA November 26, 2014, 10:27 AM

## 2014-11-09 NOTE — Progress Notes (Signed)
RT placed patient on CPAP. Patient setting is 12 cmH2O. Sterile water added to water chamber for humidification. Patient is tolerating well. RT will continue to monitor.

## 2014-11-09 NOTE — Progress Notes (Signed)
Subjective: 4 Days Post-Op Procedure(s) (LRB): TOTAL KNEE ARTHROPLASTY (Left) Patient reports pain as mild. Taking by mouth, voiding okay. No chest pain or shortness of breath. Appreciate hospitalists help with managing her medical issues. Patient admits to being depressed today.   Objective: Vital signs in last 24 hours: Temp:  [97.3 F (36.3 C)-98.7 F (37.1 C)] 98.6 F (37 C) (08/02 0456) Pulse Rate:  [84-88] 84 (08/02 0456) Resp:  [17-18] 18 (08/02 0456) BP: (112-121)/(52-60) 114/52 mmHg (08/02 0456) SpO2:  [62 %-96 %] 96 % (08/02 0456)  Intake/Output from previous day: 08/01 0701 - 08/02 0700 In: 480 [P.O.:480] Out: -  Intake/Output this shift:     Recent Labs  11/07/14 0515 11/08/14 0520 11/09/14 0523  HGB 9.3* 8.9* 8.9*    Recent Labs  11/08/14 0520 11/09/14 0523  WBC 11.5* 10.7*  RBC 2.95* 2.98*  HCT 27.9* 27.6*  PLT 202 280    Recent Labs  11/07/14 0515 11/08/14 0520  NA 135 135  K 5.0 4.7  CL 103 102  CO2 28 27  BUN 24* 26*  CREATININE 0.92 0.93  GLUCOSE 151* 129*  CALCIUM 8.8* 8.9    Recent Labs  11/08/14 0520 11/09/14 0523  INR 1.35 1.23   Left knee exam: Dressing clean and dry. Passive range of motion is 0 to 45. Calf is soft and nontender. Moves foot and toes actively. Normal sensation and pulses distally.   Assessment/Plan: 4 Days Post-Op Procedure(s) (LRB): TOTAL KNEE ARTHROPLASTY (Left) Plan: Patient is currently on IV heparin converting back to appropriate Coumadin levels Continue physical therapy. I spoke with the physical therapist about working on knee range of motion and also getting more aggressive with her CPM. Once her INR is at the acceptable level for her mechanical heart valve she can be discharged home. Reassurance was given to the patient.  Rilei Kravitz G 11/09/2014, 9:45 AM

## 2014-11-09 NOTE — Progress Notes (Signed)
Physical Therapy Treatment Patient Details Name: JULI ODOM MRN: [id number] DOB: 1946/01/08 Today's Date: 11/09/2014    History of Present Illness Pt is s/p L TKA.  Pt's PMH includes morbid obesity, CHF, a fib, HTN, anxiety and depression.    PT Comments    Pt continues to progress with ambulation. Able to increase walking distance without use of knee immobilizer. Still needs encouragement throughout session. Continue with current POC.  Follow Up Recommendations  Home health PT;Supervision/Assistance - 24 hour     Equipment Recommendations  None recommended by PT    Recommendations for Other Services       Precautions / Restrictions Precautions Precautions: Fall;Knee Precaution Comments: reviewed knee precautions Required Braces or Orthoses: Knee Immobilizer - Left Knee Immobilizer - Left: Discontinue once straight leg raise with < 10 degree lag Restrictions Weight Bearing Restrictions: Yes LLE Weight Bearing: Weight bearing as tolerated    Mobility  Bed Mobility Overal bed mobility: Needs Assistance Bed Mobility: Sit to Supine     Supine to sit: Min assist Sit to supine: Supervision   General bed mobility comments: Cues for hand placement and sequence. Supervision for safety.  Transfers Overall transfer level: Needs assistance Equipment used: Rolling walker (2 wheeled) Transfers: Sit to/from Stand Sit to Stand: Min assist         General transfer comment: Min A to powerup into standing. Cues for hand placement.  Ambulation/Gait Ambulation/Gait assistance: Min guard Ambulation Distance (Feet): 120 Feet Assistive device: Rolling walker (2 wheeled) Gait Pattern/deviations: Step-through pattern;Decreased stride length;Decreased weight shift to left   Gait velocity interpretation: Below normal speed for age/gender General Gait Details: Slow gait speed. Cues to initiate L knee extension during stance phase of gait.   Stairs             Wheelchair Mobility    Modified Rankin (Stroke Patients Only)       Balance Overall balance assessment: Needs assistance Sitting-balance support: No upper extremity supported;Feet supported Sitting balance-Leahy Scale: Fair     Standing balance support: Bilateral upper extremity supported;During functional activity Standing balance-Leahy Scale: Fair                      Cognition Arousal/Alertness: Awake/alert Behavior During Therapy: WFL for tasks assessed/performed;Anxious Overall Cognitive Status: Within Functional Limits for tasks assessed                      Exercises Total Joint Exercises Quad Sets: AROM;Both;10 reps;Seated Heel Slides: Left;10 reps;AAROM;Supine Hip ABduction/ADduction: AROM;Left;10 reps;Supine Straight Leg Raises: AROM;Left;10 reps;Supine Long Arc Quad: AROM;Left;10 reps;Seated    General Comments        Pertinent Vitals/Pain Pain Assessment: 0-10 Pain Score: 5  Pain Location: L knee Pain Descriptors / Indicators: Aching;Sore Pain Intervention(s): Monitored during session;Repositioned    Home Living                      Prior Function            PT Goals (current goals can now be found in the care plan section) Progress towards PT goals: Progressing toward goals    Frequency  7X/week    PT Plan Current plan remains appropriate    Co-evaluation             End of Session Equipment Utilized During Treatment: Gait belt Activity Tolerance: Patient tolerated treatment well Patient left: in bed;with call bell/phone within reach  Time: 1464-3142 PT Time Calculation (min) (ACUTE ONLY): 34 min  Charges:  $Gait Training: 23-37 mins $Therapeutic Exercise: 8-22 mins                    G Codes:      Allyn Kenner, SPTA 2014-11-17, 1:54 PM

## 2014-11-09 NOTE — Progress Notes (Signed)
Orthopedic Tech Progress Note Patient Details:  [name] [birth year] [id number] On cpm at 6:50 pm Patient ID: [name], female   DOB: [birth year], 69 y.o.   MRN: [id number]   Braulio Bosch 11/09/2014, 6:48 PM

## 2014-11-09 NOTE — Progress Notes (Addendum)
ANTICOAGULATION CONSULT NOTE - Follow Up Consult  Pharmacy Consult for heparin and warfarin Indication: mechanical valve and AFib  Allergies  Allergen Reactions  . Codeine Anaphylaxis    jerking   . Beta Adrenergic Blockers     Makes her crazy   . Metoprolol     depression  . Propoxyphene Other (See Comments)    Bad vivid dreams  . Toprol Xl [Metoprolol Tartrate]     depression  . Dilantin [Phenytoin Sodium Extended] Rash and Itching    Patient Measurements: Height: 5\' 7"  (170.2 cm) IBW/kg (Calculated) : 61.6 Heparin Dosing Weight: 85kg  Vital Signs: Temp: 98.6 F (37 C) (08/02 0456) Temp Source: Oral (08/02 0456) BP: 114/52 mmHg (08/02 0456) Pulse Rate: 84 (08/02 0456)  Labs:  Recent Labs  11/07/14 0515 11/07/14 0854 11/08/14 0520 11/08/14 1700 11/09/14 0523  HGB 9.3*  --  8.9*  --  8.9*  HCT 28.9*  --  27.9*  --  27.6*  PLT 206  --  202  --  280  LABPROT 52.1* 50.9* 16.8*  --  15.7*  INR 6.08* 5.90* 1.35  --  1.23  HEPARINUNFRC  --   --   --  <0.10* 0.23*  CREATININE 0.92  --  0.93  --   --     Estimated Creatinine Clearance: 70.5 mL/min (by C-G formula based on Cr of 0.93).  Assessment: 19 YOF to continue on Coumadin from PTA for history of Afib and mechanical valve. She is s/p left TKA 11/05/14.  Noted from patient's PMH/AC clinic notes that she has a history of subdural hematoma post AVR while her INR was 2.4, therefore her INR goal is lower than normal for a mechanical aortic valve. Patient's INR increased to 6 while on her home dose for unknown reasons. She did receive vitamin K. INR remains subtherapeutic after reversal (1.23) and pt is on heparin bridge. Heparin level 0.23 (subtherapeutic) on 1450 units/hr  Hgb low but stable, plt ok. No overt bleeding noted.   Home coumadin dose: 4mg  daily except 6mg  on Mondays and Fridays.  Goal of Therapy:  INR 2-2.5 Monitor platelets by anticoagulation protocol: Yes   Plan:  -Increase heparin gtt to 1600  units/hr -heparin level in 6 hours -warfarin 6mg  again tonight -daily INR/HL/CBC  Sherlon Handing, PharmD, BCPS Clinical pharmacist, pager [phone] 11/09/2014 6:27 AM  ADDENDUM Heparin level this afternoon was still low. No bleeding noted.  Plan: -increase heparin infusion to 1800 units/h -recheck HL at 2200 -warfarin orders as above  Yaniah Thiemann D. Ab Leaming, PharmD, BCPS Clinical Pharmacist Pager: [phone] 11/09/2014 4:29 PM

## 2014-11-09 NOTE — Progress Notes (Signed)
Occupational Therapy Treatment Patient Details Name: [name] MRN: [id number] DOB: 1945-11-14 Today's Date: 11/09/2014    History of present illness Pt is s/p L TKA.  Pt's PMH includes morbid obesity, CHF, a fib, HTN, anxiety and depression.   OT comments  Patient progressing towards OT goals, continue plan of care for now. Pt limited by increased anxiety. Pt became emotional during OT session, stating "I can't do anymore, I've done enough today". Therapist provided psychosocial support. Patient's husband present and provided support as well.    Follow Up Recommendations  Home health OT;Supervision/Assistance - 24 hour    Equipment Recommendations  Other (comment) (AE)    Recommendations for Other Services  None at this time  Precautions / Restrictions Precautions Precautions: Fall;Knee Precaution Comments: reviewed knee precautions Required Braces or Orthoses: Knee Immobilizer - Left Knee Immobilizer - Left: Discontinue once straight leg raise with < 10 degree lag Restrictions Weight Bearing Restrictions: Yes LLE Weight Bearing: Weight bearing as tolerated    Mobility Bed Mobility - Per PT note, pt found seated in recliner Overal bed mobility: Needs Assistance Bed Mobility: Supine to Sit     Supine to sit: Min assist     General bed mobility comments: Min A managing BLEs off bed during supine>sit.  Transfers Overall transfer level: Needs assistance Equipment used: Rolling walker (2 wheeled) Transfers: Sit to/from Stand Sit to Stand: Min guard General transfer comment: Min guard for safety. Cues for hand placement and technique. Pt with increased anxiety during transfers.     Balance Overall balance assessment: Needs assistance Sitting-balance support: No upper extremity supported;Feet supported Sitting balance-Leahy Scale: Fair     Standing balance support: Bilateral upper extremity supported;During functional activity Standing balance-Leahy Scale: Fair    ADL Overall ADL's : Needs assistance/impaired General ADL Comments: Educated patient on walk-in shower transfer technique. Pt requested to go into shower frontwards as opposed to backwards. Husband present and explained specific walk-in shower technique and patient practiced this in a simulated way. Plan is for patient to set-up BSC in shower to sit on.      Cognition   Behavior During Therapy: [facility] for tasks assessed/performed;Anxious Overall Cognitive Status: Within Functional Limits for tasks assessed                 Pertinent Vitals/ Pain       Pain Assessment: 0-10 Pain Score: 5  Pain Location: L knee Pain Descriptors / Indicators: Aching Pain Intervention(s): Limited activity within patient's tolerance;Monitored during session;Patient requesting pain meds-RN notified   Frequency Min 2X/week     Progress Toward Goals  OT Goals(current goals can now befound in the care plan section)  Progress towards OT goals: Progressing toward goals     Plan Discharge plan remains appropriate    End of Session Equipment Utilized During Treatment: Gait belt;Rolling walker   Activity Tolerance Other (comment) (patient limited by anxiety)   Patient Left in chair;with call bell/phone within reach;with family/visitor present  Nurse Communication Patient requests pain meds        Time: 2863-8177 OT Time Calculation (min): 25 min  Charges: OT General Charges $OT Visit: 1 Procedure OT Treatments $Self Care/Home Management : 23-37 mins  Sharolyn Weber , MS, OTR/L, CLT Pager: [phone]  11/09/2014, 10:59 AM

## 2014-11-10 LAB — CBC
HCT: 26.4 % — ABNORMAL LOW (ref 36.0–46.0)
Hemoglobin: 8.5 g/dL — ABNORMAL LOW (ref 12.0–15.0)
MCH: 30.5 pg (ref 26.0–34.0)
MCHC: 32.2 g/dL (ref 30.0–36.0)
MCV: 94.6 fL (ref 78.0–100.0)
PLATELETS: 260 10*3/uL (ref 150–400)
RBC: 2.79 MIL/uL — ABNORMAL LOW (ref 3.87–5.11)
RDW: 14 % (ref 11.5–15.5)
WBC: 7.7 10*3/uL (ref 4.0–10.5)

## 2014-11-10 LAB — PROTIME-INR
INR: 1.34 (ref 0.00–1.49)
PROTHROMBIN TIME: 16.7 s — AB (ref 11.6–15.2)

## 2014-11-10 LAB — HEPARIN LEVEL (UNFRACTIONATED): Heparin Unfractionated: 0.32 IU/mL (ref 0.30–0.70)

## 2014-11-10 MED ORDER — WARFARIN SODIUM 4 MG PO TABS
8.0000 mg | ORAL_TABLET | Freq: Once | ORAL | Status: AC
  Administered 2014-11-10: 8 mg via ORAL
  Filled 2014-11-10 (×2): qty 2

## 2014-11-10 NOTE — Progress Notes (Signed)
Orthopedic Tech Progress Note Patient Details:  [name] [birth year] [id number]  Patient ID: [name], female   DOB: [birth year], 69 y.o.   MRN: [id number] Placed pt's lle on cpm @ 0-50 degrees @1450 ; will increase as pt tolerates  Sharlet Salina, [name] 11/10/2014, 2:46 PM

## 2014-11-10 NOTE — Progress Notes (Signed)
ANTICOAGULATION CONSULT NOTE - Follow Up Consult  Pharmacy Consult for heparin and warfarin Indication: mechanical valve and AFib  Allergies  Allergen Reactions  . Codeine Anaphylaxis    jerking   . Beta Adrenergic Blockers     Makes her crazy   . Metoprolol     depression  . Propoxyphene Other (See Comments)    Bad vivid dreams  . Toprol Xl [Metoprolol Tartrate]     depression  . Dilantin [Phenytoin Sodium Extended] Rash and Itching    Patient Measurements: Height: 5\' 7"  (170.2 cm) IBW/kg (Calculated) : 61.6 Heparin Dosing Weight: 85kg  Vital Signs: Temp: 97.7 F (36.5 C) (08/03 0456) Temp Source: Oral (08/02 2336) BP: 106/57 mmHg (08/03 0456) Pulse Rate: 75 (08/03 0456)  Labs:  Recent Labs  11/08/14 0520  11/09/14 0523 11/09/14 1305 11/09/14 2200 11/10/14 0555  HGB 8.9*  --  8.9*  --   --  8.5*  HCT 27.9*  --  27.6*  --   --  26.4*  PLT 202  --  280  --   --  260  LABPROT 16.8*  --  15.7*  --   --  16.7*  INR 1.35  --  1.23  --   --  1.34  HEPARINUNFRC  --   < > 0.23* 0.20* 0.31 0.32  CREATININE 0.93  --   --   --   --   --   < > = values in this interval not displayed.  CrCl cannot be calculated (Unknown ideal weight.).  Assessment: 31 YOF to continue on Coumadin from PTA for history of Afib and mechanical valve. She is s/p left TKA 11/05/14.  Noted from patient's PMH/AC clinic notes that she has a history of subdural hematoma post AVR while her INR was 2.4, therefore her INR goal is lower than normal for a mechanical aortic valve. Patient's INR increased to 6 while on her home dose for unknown reasons. She did receive vitamin K and was started on heparin bridge when INR became subtherapeutic.  Heparin level therapeutic x2 on 1800 units/hr. INR still low this morning at 1.34.  Hgb was 10.2 on admission and this morning is down to 8.5 (steady drop over past few days), platelets are WNL and stable. No overt bleeding noted.   Home coumadin dose: 4mg  daily  except 6mg  on Mondays and Fridays.  Goal of Therapy:  INR 2-2.5 Monitor platelets by anticoagulation protocol: Yes   Plan:  -continue heparin at 1800 units/hr -warfarin 8mg  po x1 tonight  -daily INR/HL/CBC  Norvell Ureste D. Savannaha Stonerock, PharmD, BCPS Clinical Pharmacist Pager: [phone] 11/10/2014 8:44 AM

## 2014-11-10 NOTE — Progress Notes (Signed)
Pt will place herself on CPAP when ready. Pt encouraged to call RT if needing any assistance.

## 2014-11-10 NOTE — Progress Notes (Signed)
PROGRESS NOTE  [name] QMG:[id number] DOB: 23-Nov-1945 DOA: 11/05/2014 PCP: Jonathon Bellows, MD   Subjective / 24 H Interval events - no complaints, no chest pain, dyspnea, doing well post op  Assessment/Plan: Active Problems:   Atrial fibrillation   Essential hypertension   Chronic anticoagulation   S/P AVR (aortic valve replacement)   Morbid obesity   DJD (degenerative joint disease) of knee   Primary osteoarthritis of left knee   Elevated INR   Elevated INR in patient with A fib and mechanical valve - likely in the setting of antibiotics per op and Coumadin - patient did receive vitamin K on 8/1 - I have spoken with Dr. Radford Pax on 8/1, given history of SDH would not use Lovenox but instead heparin bridging - continue heparin + Coumadin  No changes today. Stable  DJD (degenerative joint disease) of knee - Postop day #2, status post left TKR Atrial fibrillation, rate controlled, CHADS score 3-4 - anticoagulation as above - Rate controlled, continue Cardizem - Continue Tikosyn Anemia of chronic disease - No signs of active bleeding for now, monitor CBC while inpatient  Diet: Diet regular Room service appropriate?: Yes; Fluid consistency:: Thin Diet general  Code Status: Full Code   Antibiotics  Anti-infectives    Start     Dose/Rate Route Frequency Ordered Stop   11/05/14 1200  ceFAZolin (ANCEF) IVPB 2 g/50 mL premix    Comments:  Patient will receive additional dosages of antibiotics due to high risk of post operative hematoma secondary to coumadin status pre/post op   2 g 100 mL/hr over 30 Minutes Intravenous Every 6 hours 11/05/14 1042 11/07/14 1159   11/05/14 0600  ceFAZolin (ANCEF) IVPB 2 g/50 mL premix     2 g 100 mL/hr over 30 Minutes Intravenous To [facility] 11/04/14 1035 11/05/14 0743       Studies  No results found.  Objective  Filed Vitals:   11/09/14 0456 11/09/14 2336 11/10/14 0456 11/10/14 0922  BP: 114/52 104/63 106/57  128/56  Pulse: 84 83 75   Temp: 98.6 F (37 C) 98.5 F (36.9 C) 97.7 F (36.5 C)   TempSrc: Oral Oral    Resp: 18 18 18    Height:      SpO2: 96% 96% 96%     Intake/Output Summary (Last 24 hours) at 11/10/14 1226 Last data filed at 11/10/14 0539  Gross per 24 hour  Intake 841.56 ml  Output    200 ml  Net 641.56 ml   There were no vitals filed for this visit.  Exam:  General:  NAD  Cardiovascular: IRRR without MRG, 2+ peripheral pulses, no edema, mechanical valve click; no JVD  Respiratory: CTA biL, good air movement, no wheezing, no crackles, no rales  GI: non tender, BS +  Data Reviewed: Basic Metabolic Panel:  Recent Labs Lab 11/05/14 0621 11/06/14 0420 11/07/14 0515 11/08/14 0520  NA 140 136 135 135  K 4.0 4.4 5.0 4.7  CL 105 103 103 102  CO2 27 25 28 27   GLUCOSE 107* 141* 151* 129*  BUN 19 17 24* 26*  CREATININE 0.79 0.81 0.92 0.93  CALCIUM 9.1 8.4* 8.8* 8.9   Liver Function Tests:  Recent Labs Lab 11/05/14 0621 11/08/14 0520  AST 20 18  ALT 18 5*  ALKPHOS 77 68  BILITOT 0.7 0.6  PROT 7.3 6.6  ALBUMIN 4.1 3.1*   No results for input(s): LIPASE, AMYLASE in the last 168 hours. No results for  input(s): AMMONIA in the last 168 hours. CBC:  Recent Labs Lab 11/06/14 0420 11/07/14 0515 11/08/14 0520 11/09/14 0523 11/10/14 0555  WBC 13.5* 11.0* 11.5* 10.7* 7.7  HGB 10.2* 9.3* 8.9* 8.9* 8.5*  HCT 30.8* 28.9* 27.9* 27.6* 26.4*  MCV 94.2 92.3 94.6 92.6 94.6  PLT 255 206 202 280 260   Cardiac Enzymes: No results for input(s): CKTOTAL, CKMB, CKMBINDEX, TROPONINI in the last 168 hours. BNP (last 3 results) No results for input(s): BNP in the last 8760 hours.  ProBNP (last 3 results)  Recent Labs  06/01/14 1148  PROBNP 56.0    CBG:  Recent Labs Lab 11/07/14 0619  GLUCAP 145*    No results found for this or any previous visit (from the past 240 hour(s)).   Scheduled Meds: . buPROPion  150 mg Oral Daily  . diltiazem  240 mg  Oral Daily  . docusate sodium  100 mg Oral BID  . dofetilide  250 mcg Oral BID  . furosemide  80 mg Oral Daily  . HYDROcodone-acetaminophen  1-2 tablet Oral 4 times per day  . losartan  100 mg Oral Daily  . potassium chloride SA  40 mEq Oral BH-q7a  . pravastatin  10 mg Oral Daily  . sertraline  150 mg Oral QHS  . warfarin  8 mg Oral ONCE-1800  . Warfarin - Pharmacist Dosing Inpatient   Does not apply q1800   Continuous Infusions: . heparin 1,800 Units/hr (11/10/14 1140)    Marzetta Board, MD [facility] Pager [phone]. If 7 PM - 7 AM, please contact night-coverage at [email or web address], password [facility] 11/10/2014, 12:26 PM  LOS: 5 days

## 2014-11-10 NOTE — Progress Notes (Signed)
Physical Therapy Treatment Patient Details Name: [name] MRN: [id number] DOB: May 24, 1945 Today's Date: 11/10/2014    History of Present Illness Pt is s/p L TKA.  Pt's PMH includes morbid obesity, CHF, a fib, HTN, anxiety and depression.    PT Comments    Pt is progressing with ambulation. Continues to increase distance with each bout. Will have an unsteady episode where pt tends to shake and partially lose their balance. Pt is self-correcting, Min G for safety. Pt continues to show signs of having a lack of confidence in herself. Suspect due to pt's situation. Constant encouragement needed throughout session.   Follow Up Recommendations  Home health PT;Supervision/Assistance - 24 hour     Equipment Recommendations  None recommended by PT    Recommendations for Other Services       Precautions / Restrictions Precautions Precautions: Fall;Knee Precaution Comments: reviewed knee precautions Restrictions LLE Weight Bearing: Weight bearing as tolerated    Mobility  Bed Mobility Overal bed mobility: Needs Assistance Bed Mobility: Sit to Supine     Supine to sit: Supervision Sit to supine: Supervision   General bed mobility comments: HOB flat. Cues for hand placement. Supervision for safety.  Transfers Overall transfer level: Needs assistance Equipment used: Rolling walker (2 wheeled) Transfers: Sit to/from Stand Sit to Stand: Supervision         General transfer comment: Cues for hand placement. Supervision for safety.  Ambulation/Gait Ambulation/Gait assistance: Min guard Ambulation Distance (Feet): 225 Feet Assistive device: Rolling walker (2 wheeled) Gait Pattern/deviations: Step-through pattern;Decreased stride length;Decreased weight shift to left   Gait velocity interpretation: Below normal speed for age/gender General Gait Details: Slow gait. Pt has unsteady episodes periodically throughout ambulation. Overall gait continues to  improve.   Stairs            Wheelchair Mobility    Modified Rankin (Stroke Patients Only)       Balance                                    Cognition Arousal/Alertness: Awake/alert Behavior During Therapy: WFL for tasks assessed/performed;Anxious Overall Cognitive Status: Within Functional Limits for tasks assessed                      Exercises Total Joint Exercises Quad Sets: AROM;Both;10 reps;Supine Heel Slides: Left;10 reps;AAROM;Supine Hip ABduction/ADduction: AROM;Left;10 reps;Supine Straight Leg Raises: AROM;Left;10 reps;Supine Long Arc Quad: AROM;Left;10 reps;Supine    General Comments        Pertinent Vitals/Pain Pain Score: 2  Faces Pain Scale: Hurts little more Pain Location: L knee Pain Descriptors / Indicators: Aching;Sore Pain Intervention(s): Monitored during session;Repositioned    Home Living                      Prior Function            PT Goals (current goals can now be found in the care plan section) Progress towards PT goals: Progressing toward goals    Frequency  7X/week    PT Plan Current plan remains appropriate    Co-evaluation             End of Session Equipment Utilized During Treatment: Gait belt Activity Tolerance: Patient tolerated treatment well Patient left: with call bell/phone within reach;in bed     Time: 1118-1203 PT Time Calculation (min) (ACUTE ONLY): 45 min  Charges:  $Gait  Training: 23-37 mins $Therapeutic Exercise: 8-22 mins                    G Codes:      Allyn Kenner, SPTA 25-Nov-2014, 12:43 PM

## 2014-11-10 NOTE — Progress Notes (Signed)
Subjective: 5 Days Post-Op Procedure(s) (LRB): TOTAL KNEE ARTHROPLASTY (Left) Patient reports pain as mild.  The patient is progressing well with physical therapy. She is ambulating in the hall this morning. She denies chest pain or shortness of breath.  Objective: Vital signs in last 24 hours: Temp:  [97.7 F (36.5 C)-98.5 F (36.9 C)] 97.7 F (36.5 C) (08/03 0456) Pulse Rate:  [75-83] 75 (08/03 0456) Resp:  [18] 18 (08/03 0456) BP: (104-106)/(57-63) 106/57 mmHg (08/03 0456) SpO2:  [96 %] 96 % (08/03 0456)  Intake/Output from previous day: 08/02 0701 - 08/03 0700 In: 841.6 [P.O.:180; I.V.:661.6] Out: 200 [Urine:200] Intake/Output this shift:     Recent Labs  11/08/14 0520 11/09/14 0523 11/10/14 0555  HGB 8.9* 8.9* 8.5*    Recent Labs  11/09/14 0523 11/10/14 0555  WBC 10.7* 7.7  RBC 2.98* 2.79*  HCT 27.6* 26.4*  PLT 280 260    Recent Labs  11/08/14 0520  NA 135  K 4.7  CL 102  CO2 27  BUN 26*  CREATININE 0.93  GLUCOSE 129*  CALCIUM 8.9    Recent Labs  11/09/14 0523 11/10/14 0555  INR 1.23 1.34  left knee exam: Dressing clean and dry. Calf soft and nontender. Improving range of motion.    Assessment/Plan: 5 Days Post-Op Procedure(s) (LRB): TOTAL KNEE ARTHROPLASTY (Left)  Subtherapeutic INR. History of mechanical heart valve. Plan: Continue physical therapy. Unfortunately her INR is still low. We will continue management per hospitalist with bridging with heparin onto Coumadin. Once INR is therapeutic she can be discharged home. Weight-bear as tolerated on left. We appreciate hospitalist's help.   Corrin Hingle G 11/10/2014, 9:12 AM

## 2014-11-10 NOTE — Progress Notes (Signed)
Orthopedic Tech Progress Note Patient Details:  [name] December 05, 1945 [phone] On cpm at 7:35 pm Patient ID: [name], female   DOB: 10/17/1945, 69 y.o.   MRN: [id number]   Braulio Bosch 11/10/2014, 7:38 PM

## 2014-11-10 NOTE — Care Management Note (Addendum)
Case Management Note  Patient Details  Name: TRINNITY BREUNIG MRN: [id number] Date of Birth: Dec 31, 1945  Subjective/Objective:                    Action/Plan:  Discharge plan remains the same. Waiting for patient to be medically ready for discharge . Case manager discussed discharge plan with [facility].   Expected Discharge Date:                  Expected Discharge Plan:   Home with [facility]  In-House Referral:  NA  Discharge planning Services  CM Consult  Post Acute Care Choice:  Home Health Choice offered to:     DME Arranged:   RW, 3in1, CPM DME Agency:   [employer]   HH Arranged:  PT HH Agency:  [facility]  Status of Service:  Completed. Medicare Important Message Given:  Yes-second notification given Date Medicare IM Given:    Medicare IM give by:    Date Additional Medicare IM Given:    Additional Medicare Important Message give by:     If discussed at [place] of Stay Meetings, dates discussed:    Additional Comments:  Ninfa Meeker, RN 11/10/2014, 1:32 PM

## 2014-11-10 NOTE — Progress Notes (Signed)
Occupational Therapy Treatment Patient Details Name: [name] MRN: [id number] DOB: 23-May-1945 Today's Date: 11/10/2014    History of present illness Pt is s/p L TKA.  Pt's PMH includes morbid obesity, CHF, a fib, HTN, anxiety and depression.   OT comments  Patient progressing towards OT goals, continue plan of care. Pt with decreased anxiety this session and able to tolerate therapy better.    Follow Up Recommendations  Home health OT;Supervision/Assistance - 24 hour    Equipment Recommendations  Other (comment) (AE)    Recommendations for Other Services  None at this time  Precautions / Restrictions Precautions Precautions: Fall;Knee Precaution Comments: reviewed knee precautions Restrictions Weight Bearing Restrictions: Yes LLE Weight Bearing: Weight bearing as tolerated    Mobility Bed Mobility Overal bed mobility: Needs Assistance Bed Mobility: Supine to Sit     Supine to sit: Supervision Sit to supine: Supervision   General bed mobility comments: supervision for safety  Transfers Overall transfer level: Needs assistance Equipment used: Rolling walker (2 wheeled) Transfers: Sit to/from Stand Sit to Stand: Supervision   General transfer comment: Cues for hand placement. Supervision for safety.    Balance Overall balance assessment: Needs assistance Sitting-balance support: No upper extremity supported;Feet supported Sitting balance-Leahy Scale: Good     Standing balance support: Bilateral upper extremity supported;During functional activity Standing balance-Leahy Scale: Fair   ADL Overall ADL's : Needs assistance/impaired General ADL Comments: Encouraged patient to try reaching LLE for LB ADLs, pt barely able to reach. Encouraged this type of stretch as a HEP. Pt then ambulated <> BR for toilet transfer with close supervision.            Cognition   Behavior During Therapy: [facility] for tasks assessed/performed;Anxious Overall Cognitive Status:  Within Functional Limits for tasks assessed                 Pertinent Vitals/ Pain       Pain Assessment: Faces Faces Pain Scale: Hurts a little bit Pain Location: L knee Pain Descriptors / Indicators: Sore Pain Intervention(s): Monitored during session;Repositioned   Frequency Min 2X/week     Progress Toward Goals  OT Goals(current goals can now befound in the care plan section)  Progress towards OT goals: Progressing toward goals     Plan Discharge plan remains appropriate    End of Session Equipment Utilized During Treatment: Rolling walker   Activity Tolerance Patient tolerated treatment well   Patient Left in chair;with call bell/phone within reach  Nurse Communication Other (comment) (plan for pt to eat in recliner, get back to bed and donn CPM)        Time: 3762-8315 OT Time Calculation (min): 13 min  Charges: OT General Charges $OT Visit: 1 Procedure OT Treatments $Self Care/Home Management : 8-22 mins  Chayse Zatarain , MS, OTR/L, CLT Pager: [phone]  11/10/2014, 1:09 PM

## 2014-11-10 NOTE — Progress Notes (Signed)
Physical Therapy Treatment Patient Details Name: ANAKAREN CAMPION MRN: [id number] DOB: [birth year] Today's Date: 11/10/2014    History of Present Illness Pt is s/p L TKA.  Pt's PMH includes morbid obesity, CHF, a fib, HTN, anxiety and depression.    PT Comments    Pt is progressing with gait and was able to increase walking distance. Pt slightly unsteady at times during gait. Reported pain at 8/10 during exercise but stated that it subsided after repetitive movements down to 2/10. Pt still unsure as to when she will be able to go home 2/2 medical reasons. Continue with current POC.   Follow Up Recommendations  Home health PT;Supervision/Assistance - 24 hour     Equipment Recommendations  None recommended by PT    Recommendations for Other Services       Precautions / Restrictions Precautions Precautions: Fall;Knee Precaution Comments: reviewed knee precautions Restrictions LLE Weight Bearing: Weight bearing as tolerated    Mobility  Bed Mobility Overal bed mobility: Needs Assistance Bed Mobility: Supine to Sit     Supine to sit: Supervision     General bed mobility comments: HOB flat. Cues for hand placement. Supervision for safety.  Transfers Overall transfer level: Needs assistance Equipment used: Rolling walker (2 wheeled) Transfers: Sit to/from Stand Sit to Stand: Min guard         General transfer comment: Cues for hand placement. Supervision for safety.  Ambulation/Gait Ambulation/Gait assistance: Min guard Ambulation Distance (Feet): 200 Feet Assistive device: Rolling walker (2 wheeled) Gait Pattern/deviations: Step-through pattern;Decreased stride length;Decreased weight shift to left   Gait velocity interpretation: Below normal speed for age/gender General Gait Details: Slow gait. Pt has unsteady episodes periodically throughout ambulation. Overall gait has improved.   Stairs            Wheelchair Mobility    Modified Rankin (Stroke  Patients Only)       Balance                                    Cognition Arousal/Alertness: Awake/alert Behavior During Therapy: WFL for tasks assessed/performed;Anxious Overall Cognitive Status: Within Functional Limits for tasks assessed                      Exercises Total Joint Exercises Quad Sets: AROM;Both;10 reps;Seated Heel Slides: Left;10 reps;AAROM;Seated Hip ABduction/ADduction: AROM;Left;10 reps;Seated Straight Leg Raises: AROM;Left;10 reps;Seated Long Arc Quad: AROM;Left;10 reps;Seated    General Comments        Pertinent Vitals/Pain Pain Score: 2  Pain Location: L knee Pain Descriptors / Indicators: Aching;Sore Pain Intervention(s): Monitored during session;Repositioned    Home Living                      Prior Function            PT Goals (current goals can now be found in the care plan section) Progress towards PT goals: Progressing toward goals    Frequency  7X/week    PT Plan Current plan remains appropriate    Co-evaluation             End of Session Equipment Utilized During Treatment: Gait belt Activity Tolerance: Patient tolerated treatment well Patient left: with call bell/phone within reach;in chair     Time: 7408-1448 PT Time Calculation (min) (ACUTE ONLY): 44 min  Charges:  G CodesAllyn Kenner, SPTA 11/15/14, 8:55 AM

## 2014-11-11 LAB — CBC
HEMATOCRIT: 27.7 % — AB (ref 36.0–46.0)
Hemoglobin: 8.8 g/dL — ABNORMAL LOW (ref 12.0–15.0)
MCH: 29.7 pg (ref 26.0–34.0)
MCHC: 31.8 g/dL (ref 30.0–36.0)
MCV: 93.6 fL (ref 78.0–100.0)
Platelets: 284 10*3/uL (ref 150–400)
RBC: 2.96 MIL/uL — ABNORMAL LOW (ref 3.87–5.11)
RDW: 14.3 % (ref 11.5–15.5)
WBC: 7.7 10*3/uL (ref 4.0–10.5)

## 2014-11-11 LAB — PROTIME-INR
INR: 1.56 — ABNORMAL HIGH (ref 0.00–1.49)
PROTHROMBIN TIME: 18.7 s — AB (ref 11.6–15.2)

## 2014-11-11 LAB — HEPARIN LEVEL (UNFRACTIONATED)
HEPARIN UNFRACTIONATED: 0.17 [IU]/mL — AB (ref 0.30–0.70)
Heparin Unfractionated: 0.18 IU/mL — ABNORMAL LOW (ref 0.30–0.70)

## 2014-11-11 MED ORDER — WARFARIN SODIUM 4 MG PO TABS
8.0000 mg | ORAL_TABLET | Freq: Once | ORAL | Status: AC
  Administered 2014-11-11: 8 mg via ORAL
  Filled 2014-11-11: qty 2

## 2014-11-11 NOTE — Progress Notes (Signed)
ANTICOAGULATION CONSULT NOTE - Follow Up Consult  Pharmacy Consult for heparin Indication: Afib/AVR   Labs:  Recent Labs  11/09/14 0523  11/10/14 0555 11/11/14 0429 11/11/14 1410  HGB 8.9*  --  8.5* 8.8*  --   HCT 27.6*  --  26.4* 27.7*  --   PLT 280  --  260 284  --   LABPROT 15.7*  --  16.7* 18.7*  --   INR 1.23  --  1.34 1.56*  --   HEPARINUNFRC 0.23*  < > 0.32 0.17* 0.18*  < > = values in this interval not displayed.    Assessment: 69yo female now subtherapeutic on heparin after two levels at very low end of goal  Goal of Therapy:  Heparin level 0.3-0.7 units/ml   Plan:  Will increase heparin gtt by 3 units/kg/hr to 2300 units/hr and check level in 8hr.  Thank you Anette Guarneri, PharmD [phone] 11/11/2014,3:11 PM

## 2014-11-11 NOTE — Progress Notes (Signed)
ANTICOAGULATION CONSULT NOTE - Follow Up Consult  Pharmacy Consult for heparin Indication: Afib/AVR   Labs:  Recent Labs  11/09/14 0523  11/09/14 2200 11/10/14 0555 11/11/14 0429  HGB 8.9*  --   --  8.5* 8.8*  HCT 27.6*  --   --  26.4* 27.7*  PLT 280  --   --  260 284  LABPROT 15.7*  --   --  16.7* 18.7*  INR 1.23  --   --  1.34 1.56*  HEPARINUNFRC 0.23*  < > 0.31 0.32 0.17*  < > = values in this interval not displayed.    Assessment: 69yo female now subtherapeutic on heparin after two levels at very low end of goal; RN reports that IV pump was beeping for a few minutes but no other gtt issues.  Goal of Therapy:  Heparin level 0.3-0.7 units/ml   Plan:  Will increase heparin gtt by 2 units/kg/hr to 2000 units/hr and check level in [place], PharmD, BCPS  11/11/2014,5:53 AM

## 2014-11-11 NOTE — Care Management Important Message (Signed)
Important Message  Patient Details  Name: [name] MRN: [id number] Date of Birth: 1945-05-15   Medicare Important Message Given:  Yes-third notification given    Delorse Lek 11/11/2014, 2:25 PM

## 2014-11-11 NOTE — Progress Notes (Signed)
PROGRESS NOTE  [name] ESP:[id number] DOB: Jan 23, 1946 DOA: 11/05/2014 PCP: Jonathon Bellows, MD   Subjective / 24 H Interval events - no complaints, no chest pain, dyspnea, doing well post op  Assessment/Plan: Active Problems:   Atrial fibrillation   Essential hypertension   Chronic anticoagulation   S/P AVR (aortic valve replacement)   Morbid obesity   DJD (degenerative joint disease) of knee   Primary osteoarthritis of left knee   Elevated INR   Elevated INR in patient with A fib and mechanical valve - likely in the setting of antibiotics per op and Coumadin - patient did receive vitamin K on 8/1 - I have spoken with Dr. Radford Pax on 8/1, given history of SDH would not use Lovenox but instead heparin bridging - continue heparin + Coumadin  No changes today. Stable. INR < 2. Continue heparin  DJD (degenerative joint disease) of knee - Postop day #2, status post left TKR Atrial fibrillation, rate controlled, CHADS score 3-4 - anticoagulation as above - Rate controlled, continue Cardizem - Continue Tikosyn Anemia of chronic disease - No signs of active bleeding for now, monitor CBC while inpatient  Diet: Diet regular Room service appropriate?: Yes; Fluid consistency:: Thin Diet general  Code Status: Full Code   Antibiotics  Anti-infectives    Start     Dose/Rate Route Frequency Ordered Stop   11/05/14 1200  ceFAZolin (ANCEF) IVPB 2 g/50 mL premix    Comments:  Patient will receive additional dosages of antibiotics due to high risk of post operative hematoma secondary to coumadin status pre/post op   2 g 100 mL/hr over 30 Minutes Intravenous Every 6 hours 11/05/14 1042 11/07/14 1159   11/05/14 0600  ceFAZolin (ANCEF) IVPB 2 g/50 mL premix     2 g 100 mL/hr over 30 Minutes Intravenous To [facility] 11/04/14 1035 11/05/14 0743       Studies  No results found.  Objective  Filed Vitals:   11/10/14 1357 11/10/14 2056 11/11/14 0524 11/11/14 1124   BP: 100/69 124/60 112/57 115/59  Pulse: 83 82 73   Temp: 98.8 F (37.1 C) 99 F (37.2 C) 98.1 F (36.7 C)   TempSrc: Oral     Resp: 18 18 18    Height:      SpO2: 96% 96% 98%     Intake/Output Summary (Last 24 hours) at 11/11/14 1258 Last data filed at 11/11/14 0800  Gross per 24 hour  Intake    360 ml  Output      0 ml  Net    360 ml   There were no vitals filed for this visit.  Exam:  General:  NAD  Cardiovascular: IRRR without MRG, 2+ peripheral pulses, no edema, mechanical valve click; no JVD  Respiratory: CTA biL, good air movement, no wheezing, no crackles, no rales  GI: non tender, BS +  Data Reviewed: Basic Metabolic Panel:  Recent Labs Lab 11/05/14 0621 11/06/14 0420 11/07/14 0515 11/08/14 0520  NA 140 136 135 135  K 4.0 4.4 5.0 4.7  CL 105 103 103 102  CO2 27 25 28 27   GLUCOSE 107* 141* 151* 129*  BUN 19 17 24* 26*  CREATININE 0.79 0.81 0.92 0.93  CALCIUM 9.1 8.4* 8.8* 8.9   Liver Function Tests:  Recent Labs Lab 11/05/14 0621 11/08/14 0520  AST 20 18  ALT 18 5*  ALKPHOS 77 68  BILITOT 0.7 0.6  PROT 7.3 6.6  ALBUMIN 4.1 3.1*   No  results for input(s): LIPASE, AMYLASE in the last 168 hours. No results for input(s): AMMONIA in the last 168 hours. CBC:  Recent Labs Lab 11/07/14 0515 11/08/14 0520 11/09/14 0523 11/10/14 0555 11/11/14 0429  WBC 11.0* 11.5* 10.7* 7.7 7.7  HGB 9.3* 8.9* 8.9* 8.5* 8.8*  HCT 28.9* 27.9* 27.6* 26.4* 27.7*  MCV 92.3 94.6 92.6 94.6 93.6  PLT 206 202 280 260 284   Cardiac Enzymes: No results for input(s): CKTOTAL, CKMB, CKMBINDEX, TROPONINI in the last 168 hours. BNP (last 3 results) No results for input(s): BNP in the last 8760 hours.  ProBNP (last 3 results)  Recent Labs  06/01/14 1148  PROBNP 56.0    CBG:  Recent Labs Lab 11/07/14 0619  GLUCAP 145*   Scheduled Meds: . buPROPion  150 mg Oral Daily  . diltiazem  240 mg Oral Daily  . docusate sodium  100 mg Oral BID  . dofetilide   250 mcg Oral BID  . furosemide  80 mg Oral Daily  . HYDROcodone-acetaminophen  1-2 tablet Oral 4 times per day  . losartan  100 mg Oral Daily  . potassium chloride SA  40 mEq Oral BH-q7a  . pravastatin  10 mg Oral Daily  . sertraline  150 mg Oral QHS  . Warfarin - Pharmacist Dosing Inpatient   Does not apply q1800   Continuous Infusions: . heparin 2,000 Units/hr (11/11/14 1202)    Marzetta Board, MD [facility] Pager [phone]. If 7 PM - 7 AM, please contact night-coverage at [email or web address], password [facility] 11/11/2014, 12:58 PM  LOS: 6 days

## 2014-11-11 NOTE — Progress Notes (Signed)
Subjective: 6 Days Post-Op Procedure(s) (LRB): TOTAL KNEE ARTHROPLASTY (Left) Patient reports pain as mild.  No chest pain or shortness of breath. Patient up in chair. Seems to be in good spirits today. Progressing with physical therapy. Anxiously awaiting her Coumadin level/INR to elevate.  Objective: Vital signs in last 24 hours: Temp:  [98.1 F (36.7 C)-99 F (37.2 C)] 98.1 F (36.7 C) (08/04 0524) Pulse Rate:  [73-83] 73 (08/04 0524) Resp:  [18] 18 (08/04 0524) BP: (100-128)/(56-69) 112/57 mmHg (08/04 0524) SpO2:  [96 %-98 %] 98 % (08/04 0524)  Intake/Output from previous day: 08/03 0701 - 08/04 0700 In: 120 [P.O.:120] Out: -  Intake/Output this shift:     Recent Labs  11/09/14 0523 11/10/14 0555 11/11/14 0429  HGB 8.9* 8.5* 8.8*    Recent Labs  11/10/14 0555 11/11/14 0429  WBC 7.7 7.7  RBC 2.79* 2.96*  HCT 26.4* 27.7*  PLT 260 284   No results for input(s): NA, K, CL, CO2, BUN, CREATININE, GLUCOSE, CALCIUM in the last 72 hours.  Recent Labs  11/10/14 0555 11/11/14 0429  INR 1.34 1.56*   left knee exam: Dressing clean and dry. Calf soft and nontender.  Neurovascular intact Sensation intact distally Intact pulses distally Dorsiflexion/Plantar flexion intact No cellulitis present Compartment soft  Assessment/Plan: 6 Days Post-Op Procedure(s) (LRB): TOTAL KNEE ARTHROPLASTY (Left) Subtherapeutic INR. History of mechanical heart valve. On heparin until INR therapeutic. Plan: Up with therapy Once INR to therapeutic level will discharge the patient home. Dressing change per nursing.  [place] G 11/11/2014, 8:50 AM

## 2014-11-11 NOTE — Progress Notes (Signed)
Physical Therapy Treatment Patient Details Name: [name] MRN: [id number] DOB: 02-03-1946 Today's Date: Dec 10, 2014    History of Present Illness Pt is s/p L TKA.  Pt's PMH includes morbid obesity, CHF, a fib, HTN, anxiety and depression.    PT Comments    Pt progressing with ambulation. Able to increase gait distance and pt had a more stable gait today compared to session yesterday. Pt continues to need encouragement throughout session. Continue with current POC.  Follow Up Recommendations  Home health PT;Supervision/Assistance - 24 hour     Equipment Recommendations  None recommended by PT    Recommendations for Other Services       Precautions / Restrictions Precautions Precautions: Fall;Knee Precaution Comments: reviewed knee precautions Restrictions LLE Weight Bearing: Weight bearing as tolerated    Mobility  Bed Mobility               General bed mobility comments: Pt in chair upon arrival and returned to chair after session.  Transfers Overall transfer level: Needs assistance Equipment used: Rolling walker (2 wheeled) Transfers: Sit to/from Stand Sit to Stand: Supervision         General transfer comment: Cues for hand placement. Supervision for safety.  Ambulation/Gait Ambulation/Gait assistance: Supervision Ambulation Distance (Feet): 400 Feet Assistive device: Rolling walker (2 wheeled) Gait Pattern/deviations: Step-through pattern;Decreased stride length;Decreased weight shift to left   Gait velocity interpretation: Below normal speed for age/gender General Gait Details: Gait speed progressing. Pt more stable overall compared to last session yesterday. Supervision for safety.   Stairs            Wheelchair Mobility    Modified Rankin (Stroke Patients Only)       Balance                                    Cognition Arousal/Alertness: Awake/alert Behavior During Therapy: WFL for tasks  assessed/performed Overall Cognitive Status: Within Functional Limits for tasks assessed                      Exercises Total Joint Exercises Quad Sets: AROM;Both;10 reps;Seated Heel Slides: Left;10 reps;AAROM;Seated Straight Leg Raises: AROM;Left;10 reps;Seated Long Arc Quad: AROM;Left;10 reps;Seated    General Comments        Pertinent Vitals/Pain Pain Assessment: 0-10 Pain Score: 2  Pain Location: L knee Pain Descriptors / Indicators: Sore Pain Intervention(s): Monitored during session    Home Living                      Prior Function            PT Goals (current goals can now be found in the care plan section) Progress towards PT goals: Progressing toward goals    Frequency  7X/week    PT Plan Current plan remains appropriate    Co-evaluation             End of Session Equipment Utilized During Treatment: Gait belt Activity Tolerance: Patient tolerated treatment well Patient left: in chair;with call bell/phone within reach     Time: 0925-1000 PT Time Calculation (min) (ACUTE ONLY): 35 min  Charges:                       G CodesAllyn Kenner, SPTA 2014/12/10, 10:08 AM

## 2014-11-12 LAB — HEPARIN LEVEL (UNFRACTIONATED)
Heparin Unfractionated: 0.53 IU/mL (ref 0.30–0.70)
Heparin Unfractionated: 0.6 IU/mL (ref 0.30–0.70)
Heparin Unfractionated: 0.79 IU/mL — ABNORMAL HIGH (ref 0.30–0.70)

## 2014-11-12 LAB — CBC
HCT: 25.4 % — ABNORMAL LOW (ref 36.0–46.0)
HEMOGLOBIN: 8.1 g/dL — AB (ref 12.0–15.0)
MCH: 30 pg (ref 26.0–34.0)
MCHC: 31.9 g/dL (ref 30.0–36.0)
MCV: 94.1 fL (ref 78.0–100.0)
Platelets: 288 10*3/uL (ref 150–400)
RBC: 2.7 MIL/uL — AB (ref 3.87–5.11)
RDW: 14.5 % (ref 11.5–15.5)
WBC: 7.3 10*3/uL (ref 4.0–10.5)

## 2014-11-12 LAB — PROTIME-INR
INR: 1.86 — AB (ref 0.00–1.49)
Prothrombin Time: 21.4 seconds — ABNORMAL HIGH (ref 11.6–15.2)

## 2014-11-12 MED ORDER — WARFARIN SODIUM 4 MG PO TABS
8.0000 mg | ORAL_TABLET | Freq: Once | ORAL | Status: AC
  Administered 2014-11-12: 8 mg via ORAL
  Filled 2014-11-12: qty 2

## 2014-11-12 NOTE — Progress Notes (Signed)
Patient placed on CPAP 12 per home use. 2L O2 bleed in. RT will continue to monitor as needed

## 2014-11-12 NOTE — Progress Notes (Signed)
Physical Therapy Treatment Patient Details Name: [name] MRN: [id number] DOB: [birth year] Today's Date: 2014-11-14    History of Present Illness Pt is s/p L TKA.  Pt's PMH includes morbid obesity, CHF, a fib, HTN, anxiety and depression.    PT Comments    Pt continues progress with rehab and is modified independent with bed mobility and transfers. Supervision when ambulating for safety. Pt is good to go home once INR levels are stable. Continue to recommend HHPT for ongoing rehab to increase functional independence and safety.   Follow Up Recommendations  Home health PT;Supervision/Assistance - 24 hour     Equipment Recommendations  None recommended by PT    Recommendations for Other Services       Precautions / Restrictions Precautions Precautions: Fall;Knee Precaution Comments: reviewed knee precautions Restrictions LLE Weight Bearing: Weight bearing as tolerated    Mobility  Bed Mobility Overal bed mobility: Modified Independent                Transfers Overall transfer level: Modified independent                  Ambulation/Gait Ambulation/Gait assistance: Supervision Ambulation Distance (Feet): 400 Feet Assistive device: Rolling walker (2 wheeled) Gait Pattern/deviations: Step-through pattern;Decreased stride length;Decreased weight shift to left   Gait velocity interpretation: Below normal speed for age/gender General Gait Details: Gait speed progressing. Pt stable overall. Supervision for safety.   Stairs            Wheelchair Mobility    Modified Rankin (Stroke Patients Only)       Balance                                    Cognition Arousal/Alertness: Awake/alert Behavior During Therapy: WFL for tasks assessed/performed Overall Cognitive Status: Within Functional Limits for tasks assessed                      Exercises Total Joint Exercises Heel Slides: Left;Standing;AROM;5 reps Long Arc  Quad: AROM;Left;10 reps;Seated Knee Flexion: AROM;Left;5 reps;Standing General Exercises - Lower Extremity Mini-Sqauts: AROM;Both;5 reps;Standing    General Comments        Pertinent Vitals/Pain Pain Assessment: No/denies pain    Home Living                      Prior Function            PT Goals (current goals can now be found in the care plan section) Progress towards PT goals: Progressing toward goals    Frequency  7X/week    PT Plan Current plan remains appropriate    Co-evaluation             End of Session Equipment Utilized During Treatment: Gait belt Activity Tolerance: Patient tolerated treatment well Patient left: in bed;with call bell/phone within reach     Time: 0900-0929 PT Time Calculation (min) (ACUTE ONLY): 29 min  Charges:                       G CodesAllyn Kenner, SPTA 2014/11/14, 9:35 AM

## 2014-11-12 NOTE — Progress Notes (Signed)
ANTICOAGULATION CONSULT NOTE - Follow Up Consult  Pharmacy Consult for Heparin  Indication: atrial fibrillation/AVR  Allergies  Allergen Reactions  . Codeine Anaphylaxis    jerking   . Beta Adrenergic Blockers     Makes her crazy   . Metoprolol     depression  . Propoxyphene Other (See Comments)    Bad vivid dreams  . Toprol Xl [Metoprolol Tartrate]     depression  . Dilantin [Phenytoin Sodium Extended] Rash and Itching    Patient Measurements: Height: 5\' 7"  (170.2 cm) IBW/kg (Calculated) : 61.6  Labs:  Recent Labs  11/09/14 0523  11/10/14 0555 11/11/14 0429 11/11/14 1410 11/12/14 0010  HGB 8.9*  --  8.5* 8.8*  --   --   HCT 27.6*  --  26.4* 27.7*  --   --   PLT 280  --  260 284  --   --   LABPROT 15.7*  --  16.7* 18.7*  --   --   INR 1.23  --  1.34 1.56*  --   --   HEPARINUNFRC 0.23*  < > 0.32 0.17* 0.18* 0.60  < > = values in this interval not displayed.   Assessment: Therapeutic heparin level after rate increase  Goal of Therapy:  Heparin level 0.3-0.7 units/ml Monitor platelets by anticoagulation protocol: Yes   Plan:  -Continue heparin at 2300 units/hr -Confirmatory HL with AM labs -Daily CBC/HL -Monitor for bleeding  Narda Bonds 11/12/2014,12:45 AM

## 2014-11-12 NOTE — Progress Notes (Addendum)
ANTICOAGULATION CONSULT NOTE - Follow Up Consult  Pharmacy Consult for Heparin / Coumadin Indication: Afib/AVR   Labs:  Recent Labs  11/10/14 0555 11/11/14 0429  11/12/14 0010 11/12/14 0450 11/12/14 1210  HGB 8.5* 8.8*  --   --  8.1*  --   HCT 26.4* 27.7*  --   --  25.4*  --   PLT 260 284  --   --  288  --   LABPROT 16.7* 18.7*  --   --  21.4*  --   INR 1.34 1.56*  --   --  1.86*  --   HEPARINUNFRC 0.32 0.17*  < > 0.60 0.79* 0.53  < > = values in this interval not displayed.    Assessment: 69yo female continues on heparin awaiting therapeutic INR for AVR / Afib INR = 1.86  Goal of Therapy:  Heparin level 0.3-0.7 units/ml  INR = 2 to 2.5   Plan:  Continue heparin at 1250 units / hr Coumadin 8 mg po x 1 Follow up AM labs  Thank you Anette Guarneri, PharmD [phone] 11/12/2014,12:59 PM    ==========================================   Addendum: - RN informed Pharmacy patient's heparin gtt hasn't been infusing but unsure of when it was turned off - RN resumed heparin immediately around 1830 - heparin levels have been variable   Plan: - Continue heparin gtt at 1250 units/hr - Check 6 hr HL    Gio Janoski D. Mina Marble, PharmD, BCPS Pager:  [phone] 11/12/2014, 6:49 PM

## 2014-11-12 NOTE — Progress Notes (Signed)
PROGRESS NOTE  [name] ZOX:[id number] DOB: [birth year] DOA: 11/05/2014 PCP: Jonathon Bellows, MD  Subjective / 24 H Interval events - no complaints, no chest pain, dyspnea, doing well, working with PT and progressing well  Assessment/Plan: Active Problems:   Atrial fibrillation   Essential hypertension   Chronic anticoagulation   S/P AVR (aortic valve replacement)   Morbid obesity   DJD (degenerative joint disease) of knee   Primary osteoarthritis of left knee   Elevated INR   Elevated INR in patient with A fib and mechanical valve - likely in the setting of antibiotics per op and Coumadin - patient did receive vitamin K on 8/1 - I have spoken with Dr. Radford Pax on 8/1, given history of SDH would not use Lovenox but instead heparin bridging - continue heparin + Coumadin  No changes today. Stable. INR < 2. Continue heparin. OK to d/c home when INR > 2  DJD (degenerative joint disease) of knee - status post left TKR Atrial fibrillation, rate controlled, CHADS score 3-4 - anticoagulation as above - Rate controlled, continue Cardizem - Continue Tikosyn Anemia of chronic disease - No signs of active bleeding for now, monitor CBC while inpatient  Diet: Diet regular Room service appropriate?: Yes; Fluid consistency:: Thin Diet general  Code Status: Full Code   Antibiotics  Anti-infectives    Start     Dose/Rate Route Frequency Ordered Stop   11/05/14 1200  ceFAZolin (ANCEF) IVPB 2 g/50 mL premix    Comments:  Patient will receive additional dosages of antibiotics due to high risk of post operative hematoma secondary to coumadin status pre/post op   2 g 100 mL/hr over 30 Minutes Intravenous Every 6 hours 11/05/14 1042 11/07/14 1159   11/05/14 0600  ceFAZolin (ANCEF) IVPB 2 g/50 mL premix     2 g 100 mL/hr over 30 Minutes Intravenous To [facility] 11/04/14 1035 11/05/14 0743       Studies  No results found.  Objective  Filed Vitals:   11/11/14 1124  11/11/14 1500 11/11/14 2042 11/12/14 0456  BP: 115/59 100/44 120/57 115/59  Pulse:  81 79 72  Temp:  98.3 F (36.8 C) 99.3 F (37.4 C) 98 F (36.7 C)  TempSrc:  Oral Oral Oral  Resp:  16 18 16   Height:      SpO2:  99% 95% 96%   Exam:  General:  NAD  Cardiovascular: IRRR without MRG, 2+ peripheral pulses, no edema, mechanical valve click; no JVD  Respiratory: CTA biL, good air movement, no wheezing, no crackles, no rales  GI: non tender, BS +  Data Reviewed: Basic Metabolic Panel:  Recent Labs Lab 11/06/14 0420 11/07/14 0515 11/08/14 0520  NA 136 135 135  K 4.4 5.0 4.7  CL 103 103 102  CO2 25 28 27   GLUCOSE 141* 151* 129*  BUN 17 24* 26*  CREATININE 0.81 0.92 0.93  CALCIUM 8.4* 8.8* 8.9   Liver Function Tests:  Recent Labs Lab 11/08/14 0520  AST 18  ALT 5*  ALKPHOS 68  BILITOT 0.6  PROT 6.6  ALBUMIN 3.1*   CBC:  Recent Labs Lab 11/08/14 0520 11/09/14 0523 11/10/14 0555 11/11/14 0429 11/12/14 0450  WBC 11.5* 10.7* 7.7 7.7 7.3  HGB 8.9* 8.9* 8.5* 8.8* 8.1*  HCT 27.9* 27.6* 26.4* 27.7* 25.4*  MCV 94.6 92.6 94.6 93.6 94.1  PLT 202 280 260 284 288   ProBNP (last 3 results)  Recent Labs  06/01/14 1148  PROBNP 56.0  CBG:  Recent Labs Lab 11/07/14 0619  GLUCAP 145*   Scheduled Meds: . buPROPion  150 mg Oral Daily  . diltiazem  240 mg Oral Daily  . docusate sodium  100 mg Oral BID  . dofetilide  250 mcg Oral BID  . furosemide  80 mg Oral Daily  . HYDROcodone-acetaminophen  1-2 tablet Oral 4 times per day  . losartan  100 mg Oral Daily  . potassium chloride SA  40 mEq Oral BH-q7a  . pravastatin  10 mg Oral Daily  . sertraline  150 mg Oral QHS  . warfarin  8 mg Oral ONCE-1800  . Warfarin - Pharmacist Dosing Inpatient   Does not apply q1800   Continuous Infusions: . heparin 2,150 Units/hr (11/12/14 0532)    Marzetta Board, MD [facility] Pager [phone]. If 7 PM - 7 AM, please contact night-coverage at [email or web address],  password [facility] 11/12/2014, 12:20 PM  LOS: 7 days

## 2014-11-12 NOTE — Progress Notes (Signed)
Heparin stopped, unaware how long, patient asked if she witnessed stopping time, she was unsure, I contacted pharmacist, and PA Ethelle Lyon) on call and Surveyor, quantity. The pharmacist monitoring the heparin therapy stated "she will place order to redraw labs in 6 hours".

## 2014-11-12 NOTE — Progress Notes (Signed)
Physical Therapy Treatment Patient Details Name: [name] MRN: [id number] DOB: April 18, 1945 Today's Date: 21-Nov-2014    History of Present Illness Pt is s/p L TKA.  Pt's PMH includes morbid obesity, CHF, a fib, HTN, anxiety and depression.    PT Comments    Pt continues to do well with therapy. Able to progress to standing exercises. Pt is good to go home once INR levels are stable. Continue to recommend HHPT for ongoing rehab to increase functional independence and safety.   Follow Up Recommendations  Home health PT;Supervision/Assistance - 24 hour     Equipment Recommendations  None recommended by PT    Recommendations for Other Services       Precautions / Restrictions Precautions Precautions: Fall;Knee Restrictions LLE Weight Bearing: Weight bearing as tolerated    Mobility  Bed Mobility Overal bed mobility: Modified Independent                Transfers Overall transfer level: Modified independent                  Ambulation/Gait Ambulation/Gait assistance: Supervision Ambulation Distance (Feet): 600 Feet Assistive device: Rolling walker (2 wheeled) Gait Pattern/deviations: Step-through pattern;Decreased weight shift to left   Gait velocity interpretation: Below normal speed for age/gender General Gait Details: Pt demo decent step-through gait pattern. Supervision for safety.   Stairs            Wheelchair Mobility    Modified Rankin (Stroke Patients Only)       Balance                                    Cognition Arousal/Alertness: Awake/alert Behavior During Therapy: WFL for tasks assessed/performed Overall Cognitive Status: Within Functional Limits for tasks assessed                      Exercises Total Joint Exercises Heel Slides: AROM;Left;10 reps;Seated Long Arc Quad: AROM;Left;10 reps;Seated Standing Hip Extension: AROM;Left;10 reps;Standing General Exercises - Lower Extremity Hip  Flexion/Marching: AROM;Left;10 reps;Standing Mini-Sqauts: AROM;Both;10 reps;Standing    General Comments        Pertinent Vitals/Pain Pain Assessment: 0-10 Pain Score: 2  Pain Location: L knee Pain Descriptors / Indicators: Aching Pain Intervention(s): Monitored during session;Repositioned    Home Living                      Prior Function            PT Goals (current goals can now be found in the care plan section) Progress towards PT goals: Progressing toward goals    Frequency  7X/week    PT Plan Current plan remains appropriate    Co-evaluation             End of Session Equipment Utilized During Treatment: Gait belt Activity Tolerance: Patient tolerated treatment well Patient left: with call bell/phone within reach;in chair     Time: 1352-1431 PT Time Calculation (min) (ACUTE ONLY): 39 min  Charges:  $Gait Training: 23-37 mins $Therapeutic Exercise: 8-22 mins                    G CodesAllyn Kenner, SPTA 11-21-14, 2:38 PM

## 2014-11-12 NOTE — Progress Notes (Signed)
ANTICOAGULATION CONSULT NOTE - Follow Up Consult  Pharmacy Consult for Heparin  Indication: atrial fibrillation/AVR  Allergies  Allergen Reactions  . Codeine Anaphylaxis    jerking   . Beta Adrenergic Blockers     Makes her crazy   . Metoprolol     depression  . Propoxyphene Other (See Comments)    Bad vivid dreams  . Toprol Xl [Metoprolol Tartrate]     depression  . Dilantin [Phenytoin Sodium Extended] Rash and Itching    Patient Measurements: Height: 5\' 7"  (170.2 cm) IBW/kg (Calculated) : 61.6  Labs:  Recent Labs  11/10/14 0555 11/11/14 0429 11/11/14 1410 11/12/14 0010 11/12/14 0450  HGB 8.5* 8.8*  --   --   --   HCT 26.4* 27.7*  --   --   --   PLT 260 284  --   --   --   LABPROT 16.7* 18.7*  --   --  21.4*  INR 1.34 1.56*  --   --  1.86*  HEPARINUNFRC 0.32 0.17* 0.18* 0.60 0.79*     Assessment: Supra-therapeutic heparin level, no issues per RN.   Goal of Therapy:  Heparin level 0.3-0.7 units/ml Monitor platelets by anticoagulation protocol: Yes   Plan:  -Decrease heparin to 2150 units/hr -1200 HL -Daily CBC/HL -Monitor for bleeding  Narda Bonds 11/12/2014,5:23 AM

## 2014-11-12 NOTE — Progress Notes (Signed)
Subjective: 7 Days Post-Op Procedure(s) (LRB): TOTAL KNEE ARTHROPLASTY (Left) Patient reports pain as mild. Making good progress with physical therapy. No dizziness, shortness of breath, or chest pain.   Objective: Vital signs in last 24 hours: Temp:  [98 F (36.7 C)-99.3 F (37.4 C)] 98 F (36.7 C) (08/05 0456) Pulse Rate:  [72-81] 72 (08/05 0456) Resp:  [16-18] 16 (08/05 0456) BP: (100-120)/(44-59) 115/59 mmHg (08/05 0456) SpO2:  [95 %-99 %] 96 % (08/05 0456)  Intake/Output from previous day: 08/04 0701 - 08/05 0700 In: 240 [P.O.:240] Out: -  Intake/Output this shift:     Recent Labs  11/10/14 0555 11/11/14 0429 11/12/14 0450  HGB 8.5* 8.8* 8.1*    Recent Labs  11/11/14 0429 11/12/14 0450  WBC 7.7 7.3  RBC 2.96* 2.70*  HCT 27.7* 25.4*  PLT 284 288   No results for input(s): NA, K, CL, CO2, BUN, CREATININE, GLUCOSE, CALCIUM in the last 72 hours.  Recent Labs  11/11/14 0429 11/12/14 0450  INR 1.56* 1.86*   left knee exam: Ted stocking in place. Dressing is clean and dry. Calf is soft and nontender. Good ankle plantar flexion and dorsiflexion. N V intact distally.  Assessment/Plan: 7 Days Post-Op Procedure(s) (LRB): TOTAL KNEE ARTHROPLASTY (Left) Subtherapeutic INR, improving. INR needs to be above 2.0 prior to discharge. Plan: Continue physical therapy. Discharge home tomorrow with home health physical therapy if INR greater than 2.0. She will then resume her home Coumadin dosage. She will need an appointment at the Coumadin clinic Monday or Tuesday to get her INR checked. Prescriptions written. Follow-up with Dr. Berenice Primas in 10 days.   Earling G 11/12/2014, 11:06 AM

## 2014-11-13 DIAGNOSIS — I10 Essential (primary) hypertension: Secondary | ICD-10-CM

## 2014-11-13 DIAGNOSIS — M1712 Unilateral primary osteoarthritis, left knee: Principal | ICD-10-CM

## 2014-11-13 DIAGNOSIS — M1732 Unilateral post-traumatic osteoarthritis, left knee: Secondary | ICD-10-CM

## 2014-11-13 DIAGNOSIS — R791 Abnormal coagulation profile: Secondary | ICD-10-CM

## 2014-11-13 DIAGNOSIS — Z954 Presence of other heart-valve replacement: Secondary | ICD-10-CM

## 2014-11-13 LAB — CBC
HEMATOCRIT: 26.9 % — AB (ref 36.0–46.0)
Hemoglobin: 8.3 g/dL — ABNORMAL LOW (ref 12.0–15.0)
MCH: 29 pg (ref 26.0–34.0)
MCHC: 30.9 g/dL (ref 30.0–36.0)
MCV: 94.1 fL (ref 78.0–100.0)
Platelets: 353 10*3/uL (ref 150–400)
RBC: 2.86 MIL/uL — AB (ref 3.87–5.11)
RDW: 14.5 % (ref 11.5–15.5)
WBC: 7.6 10*3/uL (ref 4.0–10.5)

## 2014-11-13 LAB — PROTIME-INR
INR: 2.48 — AB (ref 0.00–1.49)
PROTHROMBIN TIME: 26.5 s — AB (ref 11.6–15.2)

## 2014-11-13 LAB — HEPARIN LEVEL (UNFRACTIONATED)
HEPARIN UNFRACTIONATED: 0.46 [IU]/mL (ref 0.30–0.70)
Heparin Unfractionated: 0.39 IU/mL (ref 0.30–0.70)

## 2014-11-13 MED ORDER — WARFARIN SODIUM 4 MG PO TABS
4.0000 mg | ORAL_TABLET | Freq: Once | ORAL | Status: DC
  Filled 2014-11-13: qty 1

## 2014-11-13 NOTE — Progress Notes (Signed)
Physical Therapy Treatment Patient Details Name: MAKYNLI STILLS MRN: [id number] DOB: 01-Apr-1946 Today's Date: 11/13/2014    History of Present Illness Pt is s/p L TKA.  Pt's PMH includes morbid obesity, CHF, a fib, HTN, anxiety and depression.    PT Comments    Pt still doing well with rehab. Pt anxious to go home. Plans to D/C home this morning. Continue to recommend HHPT for ongoing rehab to increase functional independence.  Follow Up Recommendations  Home health PT;Supervision/Assistance - 24 hour     Equipment Recommendations  None recommended by PT    Recommendations for Other Services       Precautions / Restrictions Precautions Precautions: Fall;Knee Restrictions LLE Weight Bearing: Weight bearing as tolerated    Mobility  Bed Mobility Overal bed mobility: Modified Independent                Transfers Overall transfer level: Modified independent                  Ambulation/Gait Ambulation/Gait assistance: Modified independent (Device/Increase time)               Stairs            Wheelchair Mobility    Modified Rankin (Stroke Patients Only)       Balance                                    Cognition Arousal/Alertness: Awake/alert Behavior During Therapy: WFL for tasks assessed/performed Overall Cognitive Status: Within Functional Limits for tasks assessed                      Exercises      General Comments        Pertinent Vitals/Pain Pain Assessment: No/denies pain    Home Living                      Prior Function            PT Goals (current goals can now be found in the care plan section) Progress towards PT goals: Progressing toward goals    Frequency  7X/week    PT Plan Current plan remains appropriate    Co-evaluation             End of Session   Activity Tolerance: Patient tolerated treatment well Patient left: in chair;with call bell/phone within  reach     Time: 9191-6606 PT Time Calculation (min) (ACUTE ONLY): 15 min  Charges:  $Gait Training: 8-22 mins                    G Codes:      Jacqualyn Posey 11/13/2014, 10:13 AM

## 2014-11-13 NOTE — Progress Notes (Signed)
ANTICOAGULATION CONSULT NOTE - Follow Up Consult  Pharmacy Consult for Heparin  Indication: atrial fibrillation/AVR  Allergies  Allergen Reactions  . Codeine Anaphylaxis    jerking   . Beta Adrenergic Blockers     Makes her crazy   . Metoprolol     depression  . Propoxyphene Other (See Comments)    Bad vivid dreams  . Toprol Xl [Metoprolol Tartrate]     depression  . Dilantin [Phenytoin Sodium Extended] Rash and Itching    Patient Measurements: Height: 5\' 7"  (170.2 cm) IBW/kg (Calculated) : 61.6  Labs:  Recent Labs  11/10/14 0555 11/11/14 0429  11/12/14 0450 11/12/14 1210 11/13/14 0100  HGB 8.5* 8.8*  --  8.1*  --   --   HCT 26.4* 27.7*  --  25.4*  --   --   PLT 260 284  --  288  --   --   LABPROT 16.7* 18.7*  --  21.4*  --   --   INR 1.34 1.56*  --  1.86*  --   --   HEPARINUNFRC 0.32 0.17*  < > 0.79* 0.53 0.39  < > = values in this interval not displayed.   Assessment: Therapeutic heparin level x 2   Goal of Therapy:  Heparin level 0.3-0.7 units/ml Monitor platelets by anticoagulation protocol: Yes   Plan:  -Continue heparin at 2150 units/hr -Daily CBC/HL -Monitor for bleeding -DC heparin when INR >2  Narda Bonds 11/13/2014,1:51 AM

## 2014-11-13 NOTE — Discharge Summary (Signed)
Physician Discharge Summary  [name] HFW:[id number] DOB: 1945-10-17 DOA: 11/05/2014  PCP: Jonathon Bellows, MD  Admit date: 11/05/2014 Discharge date: 11/13/2014  Time spent: 30 minutes Recommendations for Outpatient Follow-up:  Left knee end stage DJD -S/P 7/29 Left total knee replacement with a Sigma system,  Elevated INR in patient with A fib and mechanical valve - Resolved INR discharge 2.48 -Discharge on patient's home regimen of warfarin 6 mg M/F, 4 mg remainder of the week -Follow-up one week with your PCP  DJD (degenerative joint disease) of knee - status post left TKA -Follow-up with Dr. Dorna Leitz (orthopedic surgery) in 2 weeks  Atrial fibrillation, rate controlled, CHADS score 3-4 - Rate controlled, continue Cardizem 240 mg daily - Continue Tikosyn 250 g BID -Cozaar 100 mg daily  HTN -See atrial fibrillation  Anemia of chronic disease - No signs of active bleeding for now, monitor CBC while inpatient    Discharge Diagnoses:  Active Problems:   Atrial fibrillation   Essential hypertension   Chronic anticoagulation   S/P AVR (aortic valve replacement)   Morbid obesity   DJD (degenerative joint disease) of knee   Primary osteoarthritis of left knee   Elevated INR   Discharge Condition: Stable   Diet recommendation: Regular  There were no vitals filed for this visit.  History of present illness:  69 y.o.WF PMHx  Atrial-Fib S/P aVR, HTN, morbid obesity, OA left knee, S/P TKA, Supratherapeutic INR,pain and functional disability in the left knee due to arthritis and has failed non-surgical conservative treatments for greater than 12 weeks to include NSAID's and/or analgesics, corticosteriod injections, viscosupplementation injections, flexibility and strengthening excercises, weight reduction as appropriate and activity modification. Onset of symptoms was gradual, starting 8 years ago with gradually worsening course since that time. The patient noted no  past surgery on the left knee(s). Patient currently rates pain in the left knee(s) at 8 out of 10 with activity. Patient has night pain, worsening of pain with activity and weight bearing, pain that interferes with activities of daily living, pain with passive range of motion, crepitus and joint swelling. Patient has evidence of subchondral cysts, subchondral sclerosis, periarticular osteophytes, joint subluxation and joint space narrowing by imaging studies. This patient has had failure of all reasonable conservative care. There is no active infection. During this admission patient received a left TKA.  Procedures: 7/29 Left total knee replacement with a Sigma system,   Consultations: Dr.John L. Berenice Primas, (orthopedic surgery)   Antibiotics    Discharge Exam: Filed Vitals:   11/12/14 1939 11/12/14 2250 11/13/14 0606 11/13/14 1410  BP: 120/53  135/62 128/58  Pulse: 80 78 76 74  Temp: 98.9 F (37.2 C)  98.6 F (37 C) 98.4 F (36.9 C)  TempSrc: Oral  Oral   Resp: 17 18 17 17   Height:      SpO2: 95% 96% 94% 95%    General: A/O 4, NAD, Cardiovascular: Regular rhythm and rate, negative murmurs rubs or gallops, normal S1/S2 Respiratory: Clear to auscultation bilateral Musculoskeletal; left knee negative sign of infection bandage in place negative discharge.  Discharge Instructions      Discharge Instructions    Ambulatory referral to Home Health    Complete by:  As directed   Please evaluate [name] for admission to [facility].  Disciplines requested:PT  Services to provide: PT  Physician to follow patient's care (the person listed here will be responsible for signing ongoing orders): Dr Berenice Primas  Requested Start of  Care Date: 08/11/6566  I certify that this patient is under my care and that I, or a Nurse Practitioner or Physician's Assistant working with me, had a face-to-face encounter that meets the physician face-to-face requirements with patient on  11/06/2014. The encounter with the patient was in whole, or in part for the following medical condition(s) which is the primary reason for home health care (List medical condition). L TKR  Special Instructions:  None  Does the patient have Medicare or Medicaid?:  Yes  The encounter with the patient was in whole, or in part, for the following medical condition, which is the primary reason for home health care:  Total knee replacement  Reason for Medically Necessary Home Health Services:  Therapy- Personnel officer, Public librarian  My clinical findings support the need for the above services:  Unable to leave home safely without assistance and/or assistive device  I certify that, based on my findings, the following services are medically necessary home health services:  Physical therapy  Further, I certify that my clinical findings support that this patient is homebound due to:  Unable to leave home safely without assistance     CPM    Complete by:  As directed   Continuous passive motion machine (CPM):      Use the CPM from 0 to 60 for 8 hours per day.      You may increase by 5-10 per day.  You may break it up into 2 or 3 sessions per day.      Use CPM for 1-2 weeks or until you are told to stop.     Call MD / Call 911    Complete by:  As directed   If you experience chest pain or shortness of breath, CALL 911 and be transported to the hospital emergency room.  If you develope a fever above 101 F, pus (white drainage) or increased drainage or redness at the wound, or calf pain, call your surgeon's office.     Call MD / Call 911    Complete by:  As directed   If you experience chest pain or shortness of breath, CALL 911 and be transported to the hospital emergency room.  If you develope a fever above 101 F, pus (white drainage) or increased drainage or redness at the wound, or calf pain, call your surgeon's office.     Constipation Prevention    Complete by:  As directed    Drink plenty of fluids.  Prune juice may be helpful.  You may use a stool softener, such as Colace (over the counter) 100 mg twice a day.  Use MiraLax (over the counter) for constipation as needed.     Constipation Prevention    Complete by:  As directed   Drink plenty of fluids.  Prune juice may be helpful.  You may use a stool softener, such as Colace (over the counter) 100 mg twice a day.  Use MiraLax (over the counter) for constipation as needed.     Diet - low sodium heart healthy    Complete by:  As directed      Diet general    Complete by:  As directed      Do not put a pillow under the knee. Place it under the heel.    Complete by:  As directed      Increase activity slowly as tolerated    Complete by:  As directed      Increase  activity slowly as tolerated    Complete by:  As directed      Weight bearing as tolerated    Complete by:  As directed   Laterality:  left  Extremity:  Lower            Medication List    STOP taking these medications        HYDROcodone-acetaminophen 5-325 MG per tablet  Commonly known as:  NORCO/VICODIN      TAKE these medications        buPROPion 150 MG 24 hr tablet  Commonly known as:  WELLBUTRIN XL  Take 150 mg by mouth daily.     calcium-vitamin D 500-200 MG-UNIT per tablet  Commonly known as:  OSCAL WITH D  Take 2 tablets by mouth daily.     diltiazem 240 MG 24 hr capsule  Commonly known as:  TIAZAC  Take 240 mg by mouth daily.     diphenhydrAMINE 12.5 MG/5ML elixir  Commonly known as:  BENADRYL  Take 5-10 mLs (12.5-25 mg total) by mouth every 4 (four) hours as needed for itching.     dofetilide 250 MCG capsule  Commonly known as:  TIKOSYN  Take 250 mcg by mouth 2 (two) times daily.     furosemide 80 MG tablet  Commonly known as:  LASIX  TAKE 1 TABLET BY MOUTH EVERY DAY     losartan 100 MG tablet  Commonly known as:  COZAAR  Take 100 mg by mouth daily.     methocarbamol 500 MG tablet  Commonly known as:  ROBAXIN   Take 1 tablet (500 mg total) by mouth every 6 (six) hours as needed for muscle spasms.     multivitamin with minerals Tabs tablet  Take 1 tablet by mouth daily.     oxyCODONE-acetaminophen 5-325 MG per tablet  Commonly known as:  PERCOCET/ROXICET  Take 1-2 tablets by mouth every 6 (six) hours as needed for severe pain.     potassium chloride SA 20 MEQ tablet  Commonly known as:  K-DUR,KLOR-CON  Take 2 tablets (40 mEq total) by mouth every morning. Take one tablet (20 mEq total) by mouth every evening.     pravastatin 10 MG tablet  Commonly known as:  PRAVACHOL  Take 10 mg by mouth daily.     tretinoin 0.05 % cream  Commonly known as:  RETIN-A  Apply 1 application topically at bedtime.     vitamin C 1000 MG tablet  Take 2,000 mg by mouth daily.     Vitamin D 2000 UNITS tablet  Take 4,000 Units by mouth daily.     warfarin 4 MG tablet  Commonly known as:  COUMADIN  Take as directed by Coumadin Clinic     ZOLOFT 100 MG tablet  Generic drug:  sertraline  Take 150 mg by mouth at bedtime.     zolpidem 10 MG tablet  Commonly known as:  AMBIEN  Take 5 mg by mouth at bedtime as needed for sleep.       Allergies  Allergen Reactions  . Codeine Anaphylaxis    jerking   . Beta Adrenergic Blockers     Makes her crazy   . Metoprolol     depression  . Propoxyphene Other (See Comments)    Bad vivid dreams  . Toprol Xl [Metoprolol Tartrate]     depression  . Dilantin [Phenytoin Sodium Extended] Rash and Itching   Follow-up Information    Follow up with GRAVES,JOHN L, MD.  Schedule an appointment as soon as possible for a visit in 2 weeks.   Specialty:  Orthopedic Surgery   Contact information:   [place] [address] [phone]       Follow up with WEBB, CAROL D, MD. Schedule an appointment as soon as possible for a visit in 1 week.   Specialty:  Family Medicine   Why:  Schedule appointment 1 week post discharge with Dr. Justin Mend CAROL supra therapeutic  INR,   Contact information:   [place] [address] [phone]        The results of significant diagnostics from this hospitalization (including imaging, microbiology, ancillary and laboratory) are listed below for reference.    Significant Diagnostic Studies: Dg Chest 2 View  10/26/2014   CLINICAL DATA:  Pre operative respiratory exam. Osteoarthritis of the knee, for total knee replacement.  EXAM: CHEST  2 VIEW  COMPARISON:  11/12/2012  FINDINGS: Borderline cardiomegaly. Prosthetic aortic valve. Pulmonary vascularity is normal and the lungs are clear. No acute osseous abnormality.  IMPRESSION: No acute abnormality.  Borderline cardiomegaly.   Electronically Signed   By: Lorriane Shire M.D.   On: 10/26/2014 09:39    Microbiology: No results found for this or any previous visit (from the past 240 hour(s)).   Labs: Basic Metabolic Panel:  Recent Labs Lab 11/07/14 0515 11/08/14 0520  NA 135 135  K 5.0 4.7  CL 103 102  CO2 28 27  GLUCOSE 151* 129*  BUN 24* 26*  CREATININE 0.92 0.93  CALCIUM 8.8* 8.9   Liver Function Tests:  Recent Labs Lab 11/08/14 0520  AST 18  ALT 5*  ALKPHOS 68  BILITOT 0.6  PROT 6.6  ALBUMIN 3.1*   No results for input(s): LIPASE, AMYLASE in the last 168 hours. No results for input(s): AMMONIA in the last 168 hours. CBC:  Recent Labs Lab 11/09/14 0523 11/10/14 0555 11/11/14 0429 11/12/14 0450 11/13/14 0348  WBC 10.7* 7.7 7.7 7.3 7.6  HGB 8.9* 8.5* 8.8* 8.1* 8.3*  HCT 27.6* 26.4* 27.7* 25.4* 26.9*  MCV 92.6 94.6 93.6 94.1 94.1  PLT 280 260 284 288 353   Cardiac Enzymes: No results for input(s): CKTOTAL, CKMB, CKMBINDEX, TROPONINI in the last 168 hours. BNP: BNP (last 3 results) No results for input(s): BNP in the last 8760 hours.  ProBNP (last 3 results)  Recent Labs  06/01/14 1148  PROBNP 56.0    CBG:  Recent Labs Lab 11/07/14 0619  GLUCAP 145*       Signed:  Dia Crawford,  MD [facility] [phone] pager

## 2014-11-13 NOTE — Progress Notes (Signed)
ANTICOAGULATION CONSULT NOTE - Follow Up Consult  Pharmacy Consult for Heparin / Coumadin Indication: Afib/AVR   Labs:  Recent Labs  11/11/14 0429  11/12/14 0450 11/12/14 1210 11/13/14 0100 11/13/14 0348  HGB 8.8*  --  8.1*  --   --  8.3*  HCT 27.7*  --  25.4*  --   --  26.9*  PLT 284  --  288  --   --  353  LABPROT 18.7*  --  21.4*  --   --  26.5*  INR 1.56*  --  1.86*  --   --  2.48*  HEPARINUNFRC 0.17*  < > 0.79* 0.53 0.39 0.46  < > = values in this interval not displayed.    Assessment: 69yo female continues on heparin awaiting therapeutic INR for AVR / Afib INR = 2.48  Goal of Therapy:  Heparin level 0.3-0.7 units/ml  INR = 2 to 2.5   Plan:  DC Heparin and heparin labs Place back on home dose of Coumadin -- 4 mg po daily except for 6 mg on Mondays and Fridays  Should discharge home today?  Thank you Anette Guarneri, PharmD [phone] 11/13/2014,8:00 AM    ==========================================   Addendum: - RN informed Pharmacy patient's heparin gtt hasn't been infusing but unsure of when it was turned off - RN resumed heparin immediately around 1830 - heparin levels have been variable   Plan: - Continue heparin gtt at 1250 units/hr - Check 6 hr HL    Thuy D. Mina Marble, PharmD, BCPS Pager:  [phone] 11/13/2014, 8:00 AM

## 2014-11-13 NOTE — Progress Notes (Signed)
   PATIENT ID: [name]   8 Days Post-Op Procedure(s) (LRB): TOTAL KNEE ARTHROPLASTY (Left)  Subjective: Doing well, sitting up in chair. Minimal pain left knee. Anxious to go home today.   Objective:  Filed Vitals:   11/13/14 0606  BP: 135/62  Pulse: 76  Temp: 98.6 F (37 C)  Resp: 17    left knee exam: Ted stocking in place. Dressing is clean and dry. Calf is soft and nontender. Good ankle plantar flexion and dorsiflexion. N V intact distally.   Labs:   Recent Labs  11/11/14 0429 11/12/14 0450 11/13/14 0348  HGB 8.8* 8.1* 8.3*   Recent Labs  11/12/14 0450 11/13/14 0348  WBC 7.3 7.6  RBC 2.70* 2.86*  HCT 25.4* 26.9*  PLT 288 353  No results for input(s): NA, K, CL, CO2, BUN, CREATININE, GLUCOSE, CALCIUM in the last 72 hours.  Assessment and Plan: 7 Days Post-Op Procedure(s) (LRB): TOTAL KNEE ARTHROPLASTY (Left) Now at therapeutic INR > 2.0, pharmacy signed off and recommended home dose Plan to d/c home today with HHPT She will need an appointment at the Coumadin clinic Monday or Tuesday to get her INR checked. Prescriptions written. Follow-up with Dr. Berenice Primas in 10 days.

## 2014-11-13 NOTE — Progress Notes (Signed)
Orthopedic Tech Progress Note Patient Details:  [name] 10-07-1945 [id number]  Patient ID: [name], female   DOB: 1945/07/17, 69 y.o.   MRN: [id number] Placed pt's lle on cpm @ 0-70 degrees @1450   Hildred Priest 11/13/2014, 1:43 PM

## 2014-11-14 DIAGNOSIS — Z471 Aftercare following joint replacement surgery: Secondary | ICD-10-CM | POA: Diagnosis not present

## 2014-11-14 DIAGNOSIS — Z5181 Encounter for therapeutic drug level monitoring: Secondary | ICD-10-CM | POA: Diagnosis not present

## 2014-11-14 DIAGNOSIS — Z96652 Presence of left artificial knee joint: Secondary | ICD-10-CM | POA: Diagnosis not present

## 2014-11-14 DIAGNOSIS — I481 Persistent atrial fibrillation: Secondary | ICD-10-CM | POA: Diagnosis not present

## 2014-11-14 DIAGNOSIS — I10 Essential (primary) hypertension: Secondary | ICD-10-CM | POA: Diagnosis not present

## 2014-11-14 DIAGNOSIS — Z7901 Long term (current) use of anticoagulants: Secondary | ICD-10-CM | POA: Diagnosis not present

## 2014-11-14 DIAGNOSIS — I509 Heart failure, unspecified: Secondary | ICD-10-CM | POA: Diagnosis not present

## 2014-11-14 DIAGNOSIS — G4733 Obstructive sleep apnea (adult) (pediatric): Secondary | ICD-10-CM | POA: Diagnosis not present

## 2014-11-16 ENCOUNTER — Ambulatory Visit (INDEPENDENT_AMBULATORY_CARE_PROVIDER_SITE_OTHER): Payer: Medicare Other | Admitting: Pharmacist

## 2014-11-16 DIAGNOSIS — Z5181 Encounter for therapeutic drug level monitoring: Secondary | ICD-10-CM

## 2014-11-16 DIAGNOSIS — I4891 Unspecified atrial fibrillation: Secondary | ICD-10-CM

## 2014-11-16 DIAGNOSIS — Z471 Aftercare following joint replacement surgery: Secondary | ICD-10-CM | POA: Diagnosis not present

## 2014-11-16 DIAGNOSIS — G4733 Obstructive sleep apnea (adult) (pediatric): Secondary | ICD-10-CM | POA: Diagnosis not present

## 2014-11-16 DIAGNOSIS — I10 Essential (primary) hypertension: Secondary | ICD-10-CM | POA: Diagnosis not present

## 2014-11-16 DIAGNOSIS — I509 Heart failure, unspecified: Secondary | ICD-10-CM | POA: Diagnosis not present

## 2014-11-16 DIAGNOSIS — Z7901 Long term (current) use of anticoagulants: Secondary | ICD-10-CM | POA: Diagnosis not present

## 2014-11-16 DIAGNOSIS — I481 Persistent atrial fibrillation: Secondary | ICD-10-CM | POA: Diagnosis not present

## 2014-11-16 LAB — POCT INR: INR: 3.4

## 2014-11-19 ENCOUNTER — Telehealth: Payer: Self-pay | Admitting: Cardiology

## 2014-11-19 DIAGNOSIS — G4733 Obstructive sleep apnea (adult) (pediatric): Secondary | ICD-10-CM | POA: Diagnosis not present

## 2014-11-19 DIAGNOSIS — I481 Persistent atrial fibrillation: Secondary | ICD-10-CM | POA: Diagnosis not present

## 2014-11-19 DIAGNOSIS — Z7901 Long term (current) use of anticoagulants: Secondary | ICD-10-CM | POA: Diagnosis not present

## 2014-11-19 DIAGNOSIS — I509 Heart failure, unspecified: Secondary | ICD-10-CM | POA: Diagnosis not present

## 2014-11-19 DIAGNOSIS — I10 Essential (primary) hypertension: Secondary | ICD-10-CM | POA: Diagnosis not present

## 2014-11-19 DIAGNOSIS — Z471 Aftercare following joint replacement surgery: Secondary | ICD-10-CM | POA: Diagnosis not present

## 2014-11-19 NOTE — Telephone Encounter (Signed)
New Message        Pt calling stating that she had a home health nurse doing pt w/ her today due to her total knee replacement done 2 weeks ago. While doing pt BP was 100/60 and oxygen level was really low and she got really dizzy. Please call back and advise.

## 2014-11-19 NOTE — Telephone Encounter (Signed)
Pt called about her VS while working with therapy today.  Stated that during therapy when it started she was fine but during she started to get dizzy and when they checked her Oxygen it was 92.  Pt stated that she felt like this for 15 minutes and has not started any new medications other than her pain medications.  Pt BP was 100/60 and she wanted to know, per the therapist, if she needed to make any changes to medications schedule.  Pt educated that it could have come from her pain medications. Also that we would not make changes off of one reading and if she want to keep a log and notice this is happening more frequently that she can let us know.  Pt verbalized understanding no questions at this time.

## 2014-11-22 DIAGNOSIS — I10 Essential (primary) hypertension: Secondary | ICD-10-CM | POA: Diagnosis not present

## 2014-11-22 DIAGNOSIS — Z471 Aftercare following joint replacement surgery: Secondary | ICD-10-CM | POA: Diagnosis not present

## 2014-11-22 DIAGNOSIS — I481 Persistent atrial fibrillation: Secondary | ICD-10-CM | POA: Diagnosis not present

## 2014-11-22 DIAGNOSIS — I509 Heart failure, unspecified: Secondary | ICD-10-CM | POA: Diagnosis not present

## 2014-11-22 DIAGNOSIS — Z7901 Long term (current) use of anticoagulants: Secondary | ICD-10-CM | POA: Diagnosis not present

## 2014-11-22 DIAGNOSIS — G4733 Obstructive sleep apnea (adult) (pediatric): Secondary | ICD-10-CM | POA: Diagnosis not present

## 2014-11-22 LAB — POCT INR: INR: 2.5

## 2014-11-23 ENCOUNTER — Other Ambulatory Visit: Payer: Self-pay | Admitting: Cardiology

## 2014-11-23 ENCOUNTER — Telehealth: Payer: Self-pay | Admitting: Cardiology

## 2014-11-23 ENCOUNTER — Ambulatory Visit (INDEPENDENT_AMBULATORY_CARE_PROVIDER_SITE_OTHER): Payer: Medicare Other | Admitting: Cardiology

## 2014-11-23 DIAGNOSIS — I4891 Unspecified atrial fibrillation: Secondary | ICD-10-CM

## 2014-11-23 DIAGNOSIS — Z9889 Other specified postprocedural states: Secondary | ICD-10-CM | POA: Diagnosis not present

## 2014-11-23 DIAGNOSIS — Z5181 Encounter for therapeutic drug level monitoring: Secondary | ICD-10-CM

## 2014-11-23 MED ORDER — DILTIAZEM HCL 30 MG PO TABS: ORAL_TABLET | ORAL | Status: DC

## 2014-11-23 NOTE — Telephone Encounter (Signed)
Spoke with [name]. She reports she had knee surgery on 7/29.  Since then several episodes of dizziness. Reports she is staying hydrated. Feels very tired and emotional. History of Afib Ablation in [place] in August 2015. This AM used pulse ox to check heart rate and it was 115-122.  Not aware of palpitations but checked artery in neck and pulse feels irregular. Currently heart rate is 63-66 but did briefly go up to 110 while talking with me on phone. Yesterday heart rate was in the 90's.   Was seen by home health nurse yesterday and to be seen again tomorrow. She reports BP was 118/60 yesterday but prior to that and during hospitalization was lower. Seeing Dr. Berenice Primas (ortho) today at 11:30.  Taking Tikosyn, cardizem, Lasix and coumadin as listed.  Will review with provider in office.

## 2014-11-23 NOTE — Telephone Encounter (Signed)
New message      Pulse is between 115-122.  She had knee replacement surgery 2 wks ago.  Since then she has had dizzy spells and lightheadedness.  Pt has a history of AFIB.  Please advise

## 2014-11-23 NOTE — Telephone Encounter (Signed)
Reviewed with Dr.Skains (DOD) and pt can take extra Cardizem 30 mg twice daily as needed for elevated heart rate.  Should follow up with MD who did ablation. I spoke with pt and gave her this information. Will send prescription to [facility] on [address] in [place].

## 2014-11-24 DIAGNOSIS — M25562 Pain in left knee: Secondary | ICD-10-CM | POA: Diagnosis not present

## 2014-11-24 DIAGNOSIS — Z96652 Presence of left artificial knee joint: Secondary | ICD-10-CM | POA: Diagnosis not present

## 2014-11-24 DIAGNOSIS — M6281 Muscle weakness (generalized): Secondary | ICD-10-CM | POA: Diagnosis not present

## 2014-11-24 DIAGNOSIS — M25662 Stiffness of left knee, not elsewhere classified: Secondary | ICD-10-CM | POA: Diagnosis not present

## 2014-11-26 DIAGNOSIS — Z96652 Presence of left artificial knee joint: Secondary | ICD-10-CM | POA: Diagnosis not present

## 2014-11-26 DIAGNOSIS — M6281 Muscle weakness (generalized): Secondary | ICD-10-CM | POA: Diagnosis not present

## 2014-11-26 DIAGNOSIS — M25662 Stiffness of left knee, not elsewhere classified: Secondary | ICD-10-CM | POA: Diagnosis not present

## 2014-11-26 DIAGNOSIS — M25562 Pain in left knee: Secondary | ICD-10-CM | POA: Diagnosis not present

## 2014-11-30 DIAGNOSIS — Z96652 Presence of left artificial knee joint: Secondary | ICD-10-CM | POA: Diagnosis not present

## 2014-11-30 DIAGNOSIS — M6281 Muscle weakness (generalized): Secondary | ICD-10-CM | POA: Diagnosis not present

## 2014-11-30 DIAGNOSIS — M25662 Stiffness of left knee, not elsewhere classified: Secondary | ICD-10-CM | POA: Diagnosis not present

## 2014-11-30 DIAGNOSIS — M25562 Pain in left knee: Secondary | ICD-10-CM | POA: Diagnosis not present

## 2014-12-02 DIAGNOSIS — M25562 Pain in left knee: Secondary | ICD-10-CM | POA: Diagnosis not present

## 2014-12-02 DIAGNOSIS — Z96652 Presence of left artificial knee joint: Secondary | ICD-10-CM | POA: Diagnosis not present

## 2014-12-02 DIAGNOSIS — M6281 Muscle weakness (generalized): Secondary | ICD-10-CM | POA: Diagnosis not present

## 2014-12-02 DIAGNOSIS — M25662 Stiffness of left knee, not elsewhere classified: Secondary | ICD-10-CM | POA: Diagnosis not present

## 2014-12-03 ENCOUNTER — Other Ambulatory Visit: Payer: Self-pay | Admitting: Cardiology

## 2014-12-03 ENCOUNTER — Telehealth: Payer: Self-pay | Admitting: Cardiology

## 2014-12-03 NOTE — Telephone Encounter (Addendum)
error 

## 2014-12-06 ENCOUNTER — Ambulatory Visit (INDEPENDENT_AMBULATORY_CARE_PROVIDER_SITE_OTHER): Payer: Medicare Other | Admitting: Cardiology

## 2014-12-06 ENCOUNTER — Ambulatory Visit (INDEPENDENT_AMBULATORY_CARE_PROVIDER_SITE_OTHER): Payer: Medicare Other | Admitting: *Deleted

## 2014-12-06 ENCOUNTER — Encounter: Payer: Self-pay | Admitting: Cardiology

## 2014-12-06 VITALS — BP 110/60 | HR 65 | Ht 67.0 in | Wt 220.4 lb

## 2014-12-06 DIAGNOSIS — I359 Nonrheumatic aortic valve disorder, unspecified: Secondary | ICD-10-CM | POA: Diagnosis not present

## 2014-12-06 DIAGNOSIS — I350 Nonrheumatic aortic (valve) stenosis: Secondary | ICD-10-CM | POA: Diagnosis not present

## 2014-12-06 DIAGNOSIS — I480 Paroxysmal atrial fibrillation: Secondary | ICD-10-CM | POA: Diagnosis not present

## 2014-12-06 DIAGNOSIS — Z952 Presence of prosthetic heart valve: Secondary | ICD-10-CM

## 2014-12-06 DIAGNOSIS — G4733 Obstructive sleep apnea (adult) (pediatric): Secondary | ICD-10-CM

## 2014-12-06 DIAGNOSIS — I4891 Unspecified atrial fibrillation: Secondary | ICD-10-CM | POA: Diagnosis not present

## 2014-12-06 DIAGNOSIS — I10 Essential (primary) hypertension: Secondary | ICD-10-CM | POA: Diagnosis not present

## 2014-12-06 DIAGNOSIS — Z5181 Encounter for therapeutic drug level monitoring: Secondary | ICD-10-CM | POA: Diagnosis not present

## 2014-12-06 DIAGNOSIS — Z954 Presence of other heart-valve replacement: Secondary | ICD-10-CM

## 2014-12-06 DIAGNOSIS — I5032 Chronic diastolic (congestive) heart failure: Secondary | ICD-10-CM

## 2014-12-06 LAB — POCT INR: INR: 3.2

## 2014-12-06 LAB — BASIC METABOLIC PANEL
BUN: 16 mg/dL (ref 6–23)
CALCIUM: 9.5 mg/dL (ref 8.4–10.5)
CO2: 28 meq/L (ref 19–32)
CREATININE: 0.75 mg/dL (ref 0.40–1.20)
Chloride: 102 mEq/L (ref 96–112)
GFR: 81.32 mL/min (ref >60.00)
Glucose, Bld: 97 mg/dL (ref 70–99)
Potassium: 4.1 mEq/L (ref 3.5–5.1)
SODIUM: 138 meq/L (ref 135–145)

## 2014-12-06 NOTE — Progress Notes (Signed)
Cardiology Office Note   Date:  12/06/2014   ID:  [name], DOB Jun 10, 1945, MRN [id number]  PCP:  Jonathon Bellows, MD    Chief Complaint  Patient presents with  . Aortic Stenosis      History of Present Illness: [name] is a 69 y.o. female with a history of afib s/p failed Tikosyn load and recurrent DCCV which failed to maintain NSR. She was loaded on amio and underwent recurrent DCCV to NSR 12/22 but this did not hold and she reverted back to afib. She underwent afib ablation at [facility] and is now on Tikosyn. She also has a history of OSA on CPAP at 12cm H2O, AS s/p mechanical AVR and chronic anticoagulation. She denies any chest pain,  LE edema or syncope.  She is doing well with her CPAP.  She uses the nasal pillow mask with chin strap. She has not been sleeping well since her knee surgery. She has noticed some increase in SOB recently and is concerned that she may be back in afib. SHe has had a lot of fatigue and depression since her knee surgery.  She has noticed that her heart is beating irregularly on occasion.      Past Medical History  Diagnosis Date  . Obesity   . Hypertension   . Aortic stenosis     status post aortic valve replacement with St. Jude mechanical prosthesis  . Subdural hematoma     in setting of INR greater than 2.2 shortly after AVR - now cleared by neurosurgery to maintain INR 2-2.5  . Dyslipidemia   . Anxiety   . Exertional shortness of breath   . Arthritis     "right little finger" (11/12/2012)  . Depression   . GERD (gastroesophageal reflux disease)     "several years ago; none since" (11/12/2012)  . Chronic anticoagulation   . Hyperlipidemia   . Persistent atrial fibrillation     4/14 TEE cardioversion success but return to Atrial fibrillation on 07/29/12 then S/P TEE/DCCV after Tikosyn load and now back in atrial fibrillation  . 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  PONV (postoperative nausea and vomiting)   . Dysrhythmia   . Sleep apnea     On CPAP at 12cm H2O  . DJD (degenerative joint disease) of knee     Past Surgical History  Procedure Laterality Date  . Burr hole for subdural hematoma  2006  . Knee arthroscopy Left 1980's    "2" (11/12/2012)  . Tee without cardioversion N/A 07/22/2012    Procedure: TRANSESOPHAGEAL ECHOCARDIOGRAM (TEE);  Surgeon: [name], MD;  Location: [facility] ENDOSCOPY;  Service: Cardiovascular;  Laterality: N/A;  Rm 2034  . Cardioversion N/A 07/22/2012    Procedure: CARDIOVERSION;  Surgeon: [name], MD;  Location: [facility] ENDOSCOPY;  Service: Cardiovascular;  Laterality: N/A;  . Knee surgery Left 1980's    "after 2 scopes they went in and did some kind of OR" (11/12/2012)  . Tubal ligation  1980's  . Cardiac valve replacement  2006    St. Jude AVR  . Cardioversion N/A 11/25/2012    Procedure: CARDIOVERSION;  Surgeon: [name], MD;  Location: [place];  Service: Cardiovascular;  Laterality: N/A;  . Cardioversion N/A 12/30/2012    Procedure: CARDIOVERSION;  Surgeon: [name], MD;  Location: [place];  Service:  Cardiovascular;  Laterality: N/A;  h&p in file-HW   . Cardioversion N/A 03/30/2013    Procedure: CARDIOVERSION;  Surgeon: [name], MD;  Location: [facility] ENDOSCOPY;  Service: Cardiovascular;  Laterality: N/A;  . Tee without cardioversion N/A 10/07/2013    Procedure: TRANSESOPHAGEAL ECHOCARDIOGRAM (TEE);  Surgeon: [name], MD;  Location: [facility] ENDOSCOPY;  Service: Cardiovascular;  Laterality: N/A;  . Electrophysiologic study  11/2013    @ [facility]  . Total knee arthroplasty Left 11/05/2014    Procedure: TOTAL KNEE ARTHROPLASTY;  Surgeon: Dorna Leitz, MD;  Location: [place];  Service: Orthopedics;  Laterality: Left;     Current Outpatient Prescriptions  Medication Sig Dispense Refill  . ALPRAZolam (XANAX XR) 2 MG 24 hr tablet Take 2 mg by mouth as needed. For anxiety    . Ascorbic Acid (VITAMIN C) 1000  MG tablet Take 2,000 mg by mouth daily.    Marland Kitchen buPROPion (WELLBUTRIN XL) 150 MG 24 hr tablet Take 150 mg by mouth daily.    . calcium-vitamin D (OSCAL WITH D) 500-200 MG-UNIT per tablet Take 2 tablets by mouth daily.     . Cholecalciferol (VITAMIN D) 2000 UNITS tablet Take 4,000 Units by mouth daily.    Marland Kitchen diltiazem (TIAZAC) 240 MG 24 hr capsule Take 240 mg by mouth daily.    Marland Kitchen dofetilide (TIKOSYN) 250 MCG capsule Take 250 mcg by mouth 2 (two) times daily.    . furosemide (LASIX) 80 MG tablet TAKE 1 TABLET BY MOUTH EVERY DAY 90 tablet 1  . HYDROmorphone (DILAUDID) 2 MG tablet Take 2 mg by mouth every 4 (four) hours as needed for severe pain.    Marland Kitchen LORazepam (ATIVAN) 0.5 MG tablet Take 0.5 mg by mouth every 8 (eight) hours as needed for anxiety. Take 1-2 tablets as needed    . losartan (COZAAR) 100 MG tablet Take 100 mg by mouth daily.    . methocarbamol (ROBAXIN) 500 MG tablet Take 1 tablet (500 mg total) by mouth every 6 (six) hours as needed for muscle spasms. 60 tablet 0  . Multiple Vitamin (MULTIVITAMIN WITH MINERALS) TABS Take 1 tablet by mouth daily.    Marland Kitchen oxyCODONE-acetaminophen (PERCOCET/ROXICET) 5-325 MG per tablet Take 1-2 tablets by mouth every 6 (six) hours as needed for severe pain. 60 tablet 0  . potassium chloride SA (K-DUR,KLOR-CON) 20 MEQ tablet Take 2 tablets (40 mEq total) by mouth every morning. Take one tablet (20 mEq total) by mouth every evening. 270 tablet 3  . pravastatin (PRAVACHOL) 10 MG tablet Take 10 mg by mouth daily.    . sertraline (ZOLOFT) 100 MG tablet Take 150 mg by mouth at bedtime.    . tretinoin (RETIN-A) 0.05 % cream Apply 1 application topically at bedtime.     Marland Kitchen warfarin (COUMADIN) 4 MG tablet Take 4 mg by mouth daily. Pt takes 6 mg on Monday and Friday and all other days pt takes 4 mg    . zolpidem (AMBIEN) 10 MG tablet Take 5 mg by mouth at bedtime as needed for sleep.     No current facility-administered medications for this visit.    Allergies:    Codeine; Beta adrenergic blockers; Metoprolol; Propoxyphene; Toprol xl; and Dilantin    Social History:  The patient  reports that she has never smoked. She has never used smokeless tobacco. She reports that she does not drink alcohol or use illicit drugs.   Family History:  The patient's family history includes Heart disease in her brother; Hyperlipidemia  in her father; Hypertension in her father and mother; Lung cancer in her mother.    ROS:  Please see the history of present illness.   Otherwise, review of systems are positive for none.   All other systems are reviewed and negative.    PHYSICAL EXAM: VS:  BP 110/60 mmHg  Pulse 65  Ht 5\' 7"  (1.702 m)  Wt 220 lb 6.4 oz (99.973 kg)  BMI 34.51 kg/m2  SpO2 98% , BMI Body mass index is 34.51 kg/(m^2). GEN: Well nourished, well developed, in no acute distress HEENT: normal Neck: no JVD, carotid bruits, or masses Cardiac: irregularly irregular; no murmurs, rubs, or gallops,no edema  Respiratory:  clear to auscultation bilaterally, normal work of breathing GI: soft, nontender, nondistended, + BS MS: no deformity or atrophy Skin: warm and dry, no rash Neuro:  Strength and sensation are intact Psych: euthymic mood, full affect   EKG:  EKG was ordered today showed atrial fibrillation with RVR at 102bpm with nonspecific ST abnormality    Recent Labs: 06/01/2014: Magnesium 2.3; Pro B Natriuretic peptide (BNP) 56.0 11/08/2014: ALT 5*; BUN 26*; Creatinine, Ser 0.93; Potassium 4.7; Sodium 135 11/13/2014: Hemoglobin 8.3*; Platelets 353    Lipid Panel No results found for: CHOL, TRIG, HDL, CHOLHDL, VLDL, LDLCALC, LDLDIRECT    Wt Readings from Last 3 Encounters:  12/06/14 220 lb 6.4 oz (99.973 kg)  10/26/14 227 lb 3.2 oz (103.057 kg)  06/22/14 228 lb (103.42 kg)     ASSESSMENT/PLAN:  1. Atrial fibrillation s/p afib ablation at [facility] - now back in atrial fibrillation fairly rate controlled.  QTC stable at 43msec. - continue  Tikosyn/warfarin/diltiazem  - refer back to Dr. Lonia Mad who did her ablation at [facility] since she is symptomatic.   - she will need 1 more week of therapuetic INR before we could consider DCCV so will see what her MD at [facility] wants to do about any med changes.  If they just recommend DCCV we will recheck her INR next week and get her set up for cardioversion if INR is ok.  INR today was 3.2. - check BMET and MG today.   - she will get her INR checked today. 2. Chronic systemic anticoagulation  3. OSA now on CPAP and tolerating well. Her d/l today showed an AHI of 0.9/hr on 12cm H2O and 73% compliance in using more than 4 hours nightly. Average nightly usage is 5 hours and 17 minutes.  4. Obesity  5. AS s/p mechanical AVR on chronic systemic anticoagulation - she is not on ASA due to spontaneous intracranial bleed in the past with INR at 2 so just on warfarin 6. Chronic diastolic CHF - compensated  - continue Lasix  7. HTN- controlled  - continue losartan/diltiazem    Current medicines are reviewed at length with the patient today.  The patient does not have concerns regarding medicines.  The following changes have been made:  no change  Labs/ tests ordered today: See above Assessment and Plan No orders of the defined types were placed in this encounter.     Disposition:   FU with me in 6 months  Signed, [name], MD  12/06/2014 10:18 AM    [facility] [place], [place], [place]  [address] Phone: [phone]; Fax: [phone]

## 2014-12-06 NOTE — Patient Instructions (Addendum)
Medication Instructions:  Your physician recommends that you continue on your current medications as directed. Please refer to the Current Medication list given to you today.   Labwork: TODAY: BMET  Testing/Procedures: None  Follow-Up: Your physician recommends that you schedule a follow-up appointment THIS Friday at 9:40 with Dr. Lehman Prom.  Your physician wants you to follow-up in: 6 months with Dr. Radford Pax. You will receive a reminder letter in the mail two months in advance. If you don't receive a letter, please call our office to schedule the follow-up appointment.   Any Other Special Instructions Will Be Listed Below (If Applicable).

## 2014-12-07 DIAGNOSIS — M25562 Pain in left knee: Secondary | ICD-10-CM | POA: Diagnosis not present

## 2014-12-07 DIAGNOSIS — Z96652 Presence of left artificial knee joint: Secondary | ICD-10-CM | POA: Diagnosis not present

## 2014-12-07 DIAGNOSIS — M25662 Stiffness of left knee, not elsewhere classified: Secondary | ICD-10-CM | POA: Diagnosis not present

## 2014-12-07 DIAGNOSIS — M6281 Muscle weakness (generalized): Secondary | ICD-10-CM | POA: Diagnosis not present

## 2014-12-07 DIAGNOSIS — Z9889 Other specified postprocedural states: Secondary | ICD-10-CM | POA: Diagnosis not present

## 2014-12-08 DIAGNOSIS — M25562 Pain in left knee: Secondary | ICD-10-CM | POA: Diagnosis not present

## 2014-12-08 DIAGNOSIS — Z96652 Presence of left artificial knee joint: Secondary | ICD-10-CM | POA: Diagnosis not present

## 2014-12-08 DIAGNOSIS — M6281 Muscle weakness (generalized): Secondary | ICD-10-CM | POA: Diagnosis not present

## 2014-12-08 DIAGNOSIS — M25662 Stiffness of left knee, not elsewhere classified: Secondary | ICD-10-CM | POA: Diagnosis not present

## 2014-12-10 DIAGNOSIS — Z79899 Other long term (current) drug therapy: Secondary | ICD-10-CM | POA: Diagnosis not present

## 2014-12-10 DIAGNOSIS — Z952 Presence of prosthetic heart valve: Secondary | ICD-10-CM | POA: Diagnosis not present

## 2014-12-10 DIAGNOSIS — I4891 Unspecified atrial fibrillation: Secondary | ICD-10-CM | POA: Diagnosis not present

## 2014-12-14 ENCOUNTER — Ambulatory Visit (INDEPENDENT_AMBULATORY_CARE_PROVIDER_SITE_OTHER): Payer: Medicare Other

## 2014-12-14 DIAGNOSIS — M6281 Muscle weakness (generalized): Secondary | ICD-10-CM | POA: Diagnosis not present

## 2014-12-14 DIAGNOSIS — M25662 Stiffness of left knee, not elsewhere classified: Secondary | ICD-10-CM | POA: Diagnosis not present

## 2014-12-14 DIAGNOSIS — I4891 Unspecified atrial fibrillation: Secondary | ICD-10-CM

## 2014-12-14 DIAGNOSIS — I480 Paroxysmal atrial fibrillation: Secondary | ICD-10-CM

## 2014-12-14 DIAGNOSIS — Z5181 Encounter for therapeutic drug level monitoring: Secondary | ICD-10-CM | POA: Diagnosis not present

## 2014-12-14 DIAGNOSIS — I359 Nonrheumatic aortic valve disorder, unspecified: Secondary | ICD-10-CM | POA: Diagnosis not present

## 2014-12-14 DIAGNOSIS — M25562 Pain in left knee: Secondary | ICD-10-CM | POA: Diagnosis not present

## 2014-12-14 DIAGNOSIS — Z96652 Presence of left artificial knee joint: Secondary | ICD-10-CM | POA: Diagnosis not present

## 2014-12-14 LAB — POCT INR: INR: 3.1

## 2014-12-19 ENCOUNTER — Other Ambulatory Visit: Payer: Self-pay | Admitting: Cardiology

## 2014-12-21 ENCOUNTER — Ambulatory Visit (INDEPENDENT_AMBULATORY_CARE_PROVIDER_SITE_OTHER): Payer: Medicare Other

## 2014-12-21 DIAGNOSIS — I4891 Unspecified atrial fibrillation: Secondary | ICD-10-CM

## 2014-12-21 DIAGNOSIS — I480 Paroxysmal atrial fibrillation: Secondary | ICD-10-CM | POA: Diagnosis not present

## 2014-12-21 DIAGNOSIS — Z5181 Encounter for therapeutic drug level monitoring: Secondary | ICD-10-CM | POA: Diagnosis not present

## 2014-12-21 DIAGNOSIS — M25662 Stiffness of left knee, not elsewhere classified: Secondary | ICD-10-CM | POA: Diagnosis not present

## 2014-12-21 DIAGNOSIS — Z96652 Presence of left artificial knee joint: Secondary | ICD-10-CM | POA: Diagnosis not present

## 2014-12-21 DIAGNOSIS — I359 Nonrheumatic aortic valve disorder, unspecified: Secondary | ICD-10-CM | POA: Diagnosis not present

## 2014-12-21 DIAGNOSIS — M25562 Pain in left knee: Secondary | ICD-10-CM | POA: Diagnosis not present

## 2014-12-21 DIAGNOSIS — M6281 Muscle weakness (generalized): Secondary | ICD-10-CM | POA: Diagnosis not present

## 2014-12-21 LAB — POCT INR: INR: 3.5

## 2014-12-22 ENCOUNTER — Encounter: Payer: Self-pay | Admitting: Cardiology

## 2014-12-23 DIAGNOSIS — M25662 Stiffness of left knee, not elsewhere classified: Secondary | ICD-10-CM | POA: Diagnosis not present

## 2014-12-23 DIAGNOSIS — M6281 Muscle weakness (generalized): Secondary | ICD-10-CM | POA: Diagnosis not present

## 2014-12-23 DIAGNOSIS — Z96652 Presence of left artificial knee joint: Secondary | ICD-10-CM | POA: Diagnosis not present

## 2014-12-23 DIAGNOSIS — M25562 Pain in left knee: Secondary | ICD-10-CM | POA: Diagnosis not present

## 2014-12-27 DIAGNOSIS — Z96652 Presence of left artificial knee joint: Secondary | ICD-10-CM | POA: Diagnosis not present

## 2014-12-27 DIAGNOSIS — M6281 Muscle weakness (generalized): Secondary | ICD-10-CM | POA: Diagnosis not present

## 2014-12-27 DIAGNOSIS — M25662 Stiffness of left knee, not elsewhere classified: Secondary | ICD-10-CM | POA: Diagnosis not present

## 2014-12-27 DIAGNOSIS — M25562 Pain in left knee: Secondary | ICD-10-CM | POA: Diagnosis not present

## 2014-12-29 ENCOUNTER — Ambulatory Visit (INDEPENDENT_AMBULATORY_CARE_PROVIDER_SITE_OTHER): Payer: Medicare Other | Admitting: Pharmacist

## 2014-12-29 DIAGNOSIS — Z5181 Encounter for therapeutic drug level monitoring: Secondary | ICD-10-CM

## 2014-12-29 DIAGNOSIS — Z96652 Presence of left artificial knee joint: Secondary | ICD-10-CM | POA: Diagnosis not present

## 2014-12-29 DIAGNOSIS — I359 Nonrheumatic aortic valve disorder, unspecified: Secondary | ICD-10-CM

## 2014-12-29 DIAGNOSIS — I480 Paroxysmal atrial fibrillation: Secondary | ICD-10-CM

## 2014-12-29 DIAGNOSIS — M25662 Stiffness of left knee, not elsewhere classified: Secondary | ICD-10-CM | POA: Diagnosis not present

## 2014-12-29 DIAGNOSIS — M25562 Pain in left knee: Secondary | ICD-10-CM | POA: Diagnosis not present

## 2014-12-29 DIAGNOSIS — M6281 Muscle weakness (generalized): Secondary | ICD-10-CM | POA: Diagnosis not present

## 2014-12-29 DIAGNOSIS — I4891 Unspecified atrial fibrillation: Secondary | ICD-10-CM | POA: Diagnosis not present

## 2014-12-29 LAB — POCT INR: INR: 2.5

## 2014-12-30 DIAGNOSIS — I4891 Unspecified atrial fibrillation: Secondary | ICD-10-CM | POA: Diagnosis not present

## 2015-01-03 DIAGNOSIS — M25562 Pain in left knee: Secondary | ICD-10-CM | POA: Diagnosis not present

## 2015-01-03 DIAGNOSIS — M6281 Muscle weakness (generalized): Secondary | ICD-10-CM | POA: Diagnosis not present

## 2015-01-03 DIAGNOSIS — Z96652 Presence of left artificial knee joint: Secondary | ICD-10-CM | POA: Diagnosis not present

## 2015-01-03 DIAGNOSIS — M25662 Stiffness of left knee, not elsewhere classified: Secondary | ICD-10-CM | POA: Diagnosis not present

## 2015-01-04 DIAGNOSIS — Z9889 Other specified postprocedural states: Secondary | ICD-10-CM | POA: Diagnosis not present

## 2015-01-04 DIAGNOSIS — Z96652 Presence of left artificial knee joint: Secondary | ICD-10-CM | POA: Diagnosis not present

## 2015-01-05 DIAGNOSIS — M6281 Muscle weakness (generalized): Secondary | ICD-10-CM | POA: Diagnosis not present

## 2015-01-05 DIAGNOSIS — I4891 Unspecified atrial fibrillation: Secondary | ICD-10-CM | POA: Diagnosis not present

## 2015-01-05 DIAGNOSIS — M25562 Pain in left knee: Secondary | ICD-10-CM | POA: Diagnosis not present

## 2015-01-05 DIAGNOSIS — M25662 Stiffness of left knee, not elsewhere classified: Secondary | ICD-10-CM | POA: Diagnosis not present

## 2015-01-05 DIAGNOSIS — Z96652 Presence of left artificial knee joint: Secondary | ICD-10-CM | POA: Diagnosis not present

## 2015-01-07 DIAGNOSIS — I4891 Unspecified atrial fibrillation: Secondary | ICD-10-CM | POA: Diagnosis not present

## 2015-01-07 DIAGNOSIS — Z952 Presence of prosthetic heart valve: Secondary | ICD-10-CM | POA: Diagnosis not present

## 2015-01-07 DIAGNOSIS — I10 Essential (primary) hypertension: Secondary | ICD-10-CM | POA: Diagnosis not present

## 2015-01-10 DIAGNOSIS — M25562 Pain in left knee: Secondary | ICD-10-CM | POA: Diagnosis not present

## 2015-01-10 DIAGNOSIS — M6281 Muscle weakness (generalized): Secondary | ICD-10-CM | POA: Diagnosis not present

## 2015-01-10 DIAGNOSIS — Z96652 Presence of left artificial knee joint: Secondary | ICD-10-CM | POA: Diagnosis not present

## 2015-01-10 DIAGNOSIS — M25662 Stiffness of left knee, not elsewhere classified: Secondary | ICD-10-CM | POA: Diagnosis not present

## 2015-01-12 DIAGNOSIS — M25662 Stiffness of left knee, not elsewhere classified: Secondary | ICD-10-CM | POA: Diagnosis not present

## 2015-01-12 DIAGNOSIS — Z96652 Presence of left artificial knee joint: Secondary | ICD-10-CM | POA: Diagnosis not present

## 2015-01-12 DIAGNOSIS — M25562 Pain in left knee: Secondary | ICD-10-CM | POA: Diagnosis not present

## 2015-01-12 DIAGNOSIS — M6281 Muscle weakness (generalized): Secondary | ICD-10-CM | POA: Diagnosis not present

## 2015-01-17 ENCOUNTER — Ambulatory Visit (INDEPENDENT_AMBULATORY_CARE_PROVIDER_SITE_OTHER): Payer: Medicare Other

## 2015-01-17 DIAGNOSIS — Z5181 Encounter for therapeutic drug level monitoring: Secondary | ICD-10-CM

## 2015-01-17 DIAGNOSIS — I480 Paroxysmal atrial fibrillation: Secondary | ICD-10-CM | POA: Diagnosis not present

## 2015-01-17 DIAGNOSIS — I4891 Unspecified atrial fibrillation: Secondary | ICD-10-CM | POA: Diagnosis not present

## 2015-01-17 DIAGNOSIS — I359 Nonrheumatic aortic valve disorder, unspecified: Secondary | ICD-10-CM | POA: Diagnosis not present

## 2015-01-17 LAB — POCT INR: INR: 2

## 2015-01-18 ENCOUNTER — Ambulatory Visit: Payer: Medicare Other | Admitting: Cardiology

## 2015-01-24 DIAGNOSIS — Z23 Encounter for immunization: Secondary | ICD-10-CM | POA: Diagnosis not present

## 2015-01-31 ENCOUNTER — Other Ambulatory Visit: Payer: Self-pay | Admitting: Cardiology

## 2015-02-02 DIAGNOSIS — Z85828 Personal history of other malignant neoplasm of skin: Secondary | ICD-10-CM | POA: Diagnosis not present

## 2015-02-02 DIAGNOSIS — I8312 Varicose veins of left lower extremity with inflammation: Secondary | ICD-10-CM | POA: Diagnosis not present

## 2015-02-02 DIAGNOSIS — I8311 Varicose veins of right lower extremity with inflammation: Secondary | ICD-10-CM | POA: Diagnosis not present

## 2015-02-02 DIAGNOSIS — I872 Venous insufficiency (chronic) (peripheral): Secondary | ICD-10-CM | POA: Diagnosis not present

## 2015-02-07 ENCOUNTER — Ambulatory Visit (INDEPENDENT_AMBULATORY_CARE_PROVIDER_SITE_OTHER): Payer: Medicare Other

## 2015-02-07 DIAGNOSIS — I4891 Unspecified atrial fibrillation: Secondary | ICD-10-CM

## 2015-02-07 DIAGNOSIS — I359 Nonrheumatic aortic valve disorder, unspecified: Secondary | ICD-10-CM | POA: Diagnosis not present

## 2015-02-07 DIAGNOSIS — I480 Paroxysmal atrial fibrillation: Secondary | ICD-10-CM

## 2015-02-07 DIAGNOSIS — Z5181 Encounter for therapeutic drug level monitoring: Secondary | ICD-10-CM | POA: Diagnosis not present

## 2015-02-07 LAB — POCT INR: INR: 2.2

## 2015-02-15 DIAGNOSIS — Z09 Encounter for follow-up examination after completed treatment for conditions other than malignant neoplasm: Secondary | ICD-10-CM | POA: Diagnosis not present

## 2015-02-15 DIAGNOSIS — Z96652 Presence of left artificial knee joint: Secondary | ICD-10-CM | POA: Diagnosis not present

## 2015-02-18 DIAGNOSIS — I10 Essential (primary) hypertension: Secondary | ICD-10-CM | POA: Diagnosis not present

## 2015-02-18 DIAGNOSIS — I4891 Unspecified atrial fibrillation: Secondary | ICD-10-CM | POA: Diagnosis not present

## 2015-03-07 ENCOUNTER — Ambulatory Visit (INDEPENDENT_AMBULATORY_CARE_PROVIDER_SITE_OTHER): Payer: Medicare Other | Admitting: *Deleted

## 2015-03-07 DIAGNOSIS — I4891 Unspecified atrial fibrillation: Secondary | ICD-10-CM

## 2015-03-07 DIAGNOSIS — Z5181 Encounter for therapeutic drug level monitoring: Secondary | ICD-10-CM | POA: Diagnosis not present

## 2015-03-07 DIAGNOSIS — I359 Nonrheumatic aortic valve disorder, unspecified: Secondary | ICD-10-CM | POA: Diagnosis not present

## 2015-03-07 DIAGNOSIS — I480 Paroxysmal atrial fibrillation: Secondary | ICD-10-CM

## 2015-03-07 LAB — POCT INR: INR: 2.3

## 2015-03-11 ENCOUNTER — Encounter: Payer: Self-pay | Admitting: Cardiology

## 2015-03-13 ENCOUNTER — Other Ambulatory Visit: Payer: Self-pay | Admitting: Cardiology

## 2015-03-21 ENCOUNTER — Other Ambulatory Visit: Payer: Self-pay | Admitting: Cardiology

## 2015-03-21 NOTE — Telephone Encounter (Signed)
New message   *STAT* If patient is at the pharmacy, call can be transferred to refill team.   1. Which medications need to be refilled? (please list name of each medication and dose if known) losartan  2. Which pharmacy/location (including street and city if local pharmacy) is medication to be sent to? [facility] on [address] [phone]  3. Do they need a 30 day or 90 day supply? 90 day supply   Comments: pt has already called it into her pharmacy. Pharmacy is awaiting a response

## 2015-03-22 ENCOUNTER — Telehealth: Payer: Self-pay

## 2015-03-22 NOTE — Telephone Encounter (Signed)
DILT-XR 240 MG 24 hr capsule rx sent over to pharmacy electronically. Med refill not due; called pharmacy, spoke with pharmacist to cancel request. Pharmacist unsure why request was sent.

## 2015-03-22 NOTE — Telephone Encounter (Signed)
Rx(s) sent to pharmacy electronically.  

## 2015-03-23 DIAGNOSIS — I8311 Varicose veins of right lower extremity with inflammation: Secondary | ICD-10-CM | POA: Diagnosis not present

## 2015-03-23 DIAGNOSIS — Z85828 Personal history of other malignant neoplasm of skin: Secondary | ICD-10-CM | POA: Diagnosis not present

## 2015-03-23 DIAGNOSIS — I8312 Varicose veins of left lower extremity with inflammation: Secondary | ICD-10-CM | POA: Diagnosis not present

## 2015-03-23 DIAGNOSIS — I872 Venous insufficiency (chronic) (peripheral): Secondary | ICD-10-CM | POA: Diagnosis not present

## 2015-04-05 ENCOUNTER — Ambulatory Visit (INDEPENDENT_AMBULATORY_CARE_PROVIDER_SITE_OTHER): Payer: Medicare Other | Admitting: Pharmacist

## 2015-04-05 DIAGNOSIS — I480 Paroxysmal atrial fibrillation: Secondary | ICD-10-CM

## 2015-04-05 DIAGNOSIS — I4891 Unspecified atrial fibrillation: Secondary | ICD-10-CM

## 2015-04-05 DIAGNOSIS — I359 Nonrheumatic aortic valve disorder, unspecified: Secondary | ICD-10-CM

## 2015-04-05 DIAGNOSIS — Z5181 Encounter for therapeutic drug level monitoring: Secondary | ICD-10-CM

## 2015-04-05 LAB — POCT INR: INR: 2

## 2015-05-03 ENCOUNTER — Other Ambulatory Visit: Payer: Self-pay | Admitting: Cardiology

## 2015-05-03 ENCOUNTER — Ambulatory Visit (INDEPENDENT_AMBULATORY_CARE_PROVIDER_SITE_OTHER): Payer: Medicare Other | Admitting: *Deleted

## 2015-05-03 DIAGNOSIS — I359 Nonrheumatic aortic valve disorder, unspecified: Secondary | ICD-10-CM | POA: Diagnosis not present

## 2015-05-03 DIAGNOSIS — I4891 Unspecified atrial fibrillation: Secondary | ICD-10-CM | POA: Diagnosis not present

## 2015-05-03 DIAGNOSIS — I480 Paroxysmal atrial fibrillation: Secondary | ICD-10-CM

## 2015-05-03 DIAGNOSIS — Z5181 Encounter for therapeutic drug level monitoring: Secondary | ICD-10-CM

## 2015-05-03 LAB — POCT INR: INR: 2.1

## 2015-05-17 DIAGNOSIS — Z09 Encounter for follow-up examination after completed treatment for conditions other than malignant neoplasm: Secondary | ICD-10-CM | POA: Diagnosis not present

## 2015-05-17 DIAGNOSIS — Z96652 Presence of left artificial knee joint: Secondary | ICD-10-CM | POA: Diagnosis not present

## 2015-06-07 NOTE — Progress Notes (Signed)
Cardiology Office Note   Date:  06/08/2015   ID:  [name], DOB 1945-07-26, MRN [id number]  PCP:  Jonathon Bellows, MD    Chief Complaint  Patient presents with  . Atrial Fibrillation  . Sleep Apnea  . Aortic Stenosis      History of Present Illness: [name] is a 70 y.o. female with a history of afib s/p failed Tikosyn load and recurrent DCCV which failed to maintain NSR. She was loaded on amio and underwent recurrent DCCV to NSR  but this did not hold and she reverted back to afib. She underwent afib ablation at [facility] and is now on Tikosyn. She also has a history of OSA on CPAP at 12cm H2O, AS s/p mechanical AVR and chronic anticoagulation. At last OV she was back in atrial fibrillation and was referred back to [facility].  By the time she saw the EP MD at [facility] she was back in NSR.  She has not had any further palpitations since then.  She denies any chest pain,LE edema or syncope. Occasionally she will notice some mild DOE with walking the dog.  She is doing well with her CPAP. She uses the nasal pillow mask with chin strap.She feels rested in the am and has no daytime sleepiness.      Past Medical History  Diagnosis Date  . Obesity   . Hypertension   . Aortic stenosis     status post aortic valve replacement with St. Jude mechanical prosthesis  . Subdural hematoma (HCC)     in setting of INR greater than 2.2 shortly after AVR - now cleared by neurosurgery to maintain INR 2-2.5  . Dyslipidemia   . Anxiety   . Exertional shortness of breath   . Arthritis     "right little finger" (11/12/2012)  . Depression   . GERD (gastroesophageal reflux disease)     "several years ago; none since" (11/12/2012)  . Chronic anticoagulation   . Hyperlipidemia   . Persistent atrial fibrillation ([place])     4/14 TEE cardioversion success but return to Atrial fibrillation on 07/29/12 then S/P TEE/DCCV after Tikosyn load and now back in atrial fibrillation  .  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 PONV (postoperative nausea and vomiting)   . Dysrhythmia   . Sleep apnea     On CPAP at 12cm H2O  . DJD (degenerative joint disease) of knee     Past Surgical History  Procedure Laterality Date  . Burr hole for subdural hematoma  2006  . Knee arthroscopy Left 1980's    "2" (11/12/2012)  . Tee without cardioversion N/A 07/22/2012    Procedure: TRANSESOPHAGEAL ECHOCARDIOGRAM (TEE);  Surgeon: [name], MD;  Location: [facility] ENDOSCOPY;  Service: Cardiovascular;  Laterality: N/A;  Rm 2034  . Cardioversion N/A 07/22/2012    Procedure: CARDIOVERSION;  Surgeon: [name], MD;  Location: [facility] ENDOSCOPY;  Service: Cardiovascular;  Laterality: N/A;  . Knee surgery Left 1980's    "after 2 scopes they went in and did some kind of OR" (11/12/2012)  . Tubal ligation  1980's  . Cardiac valve replacement  2006    St. Jude AVR  . Cardioversion N/A 11/25/2012    Procedure: CARDIOVERSION;  Surgeon: [name], MD;  Location: [place];  Service: Cardiovascular;  Laterality: N/A;  . Cardioversion N/A 12/30/2012  Procedure: CARDIOVERSION;  Surgeon: [name], MD;  Location: [facility] ENDOSCOPY;  Service: Cardiovascular;  Laterality: N/A;  h&p in file-HW   . Cardioversion N/A 03/30/2013    Procedure: CARDIOVERSION;  Surgeon: [name], MD;  Location: [facility] ENDOSCOPY;  Service: Cardiovascular;  Laterality: N/A;  . Tee without cardioversion N/A 10/07/2013    Procedure: TRANSESOPHAGEAL ECHOCARDIOGRAM (TEE);  Surgeon: [name], MD;  Location: [facility] ENDOSCOPY;  Service: Cardiovascular;  Laterality: N/A;  . Electrophysiologic study  11/2013    @ [facility]  . Total knee arthroplasty Left 11/05/2014    Procedure: TOTAL KNEE ARTHROPLASTY;  Surgeon: Dorna Leitz, MD;  Location: [place];  Service: Orthopedics;  Laterality: Left;     Current Outpatient Prescriptions  Medication Sig Dispense Refill  . Ascorbic Acid (VITAMIN C) 1000 MG tablet  Take 2,000 mg by mouth daily.    Marland Kitchen buPROPion (WELLBUTRIN XL) 150 MG 24 hr tablet Take 150 mg by mouth daily.    . calcium-vitamin D (OSCAL WITH D) 500-200 MG-UNIT per tablet Take 2 tablets by mouth daily.     . Cholecalciferol (VITAMIN D) 2000 UNITS tablet Take 4,000 Units by mouth daily.    Marland Kitchen DILT-XR 240 MG 24 hr capsule TAKE 1 CAPSULE BY MOUTH EVERY MORNING ON AN EMPTY STOMACH 90 capsule 2  . dofetilide (TIKOSYN) 250 MCG capsule Take 250 mcg by mouth 2 (two) times daily.    . furosemide (LASIX) 80 MG tablet TAKE 1 TABLET BY MOUTH EVERY DAY 90 tablet 6  . losartan (COZAAR) 100 MG tablet Take 100 mg by mouth daily.    . Multiple Vitamin (MULTIVITAMIN WITH MINERALS) TABS Take 1 tablet by mouth daily.    . potassium chloride SA (K-DUR,KLOR-CON) 20 MEQ tablet Take 2 tablets (40 mEq total) by mouth every morning. Take one tablet (20 mEq total) by mouth every evening. 270 tablet 3  . pravastatin (PRAVACHOL) 10 MG tablet Take 10 mg by mouth daily.    . sertraline (ZOLOFT) 100 MG tablet Take 150 mg by mouth at bedtime.    . tretinoin (RETIN-A) 0.05 % cream Apply 1 application topically at bedtime.     Marland Kitchen warfarin (COUMADIN) 4 MG tablet TAKE AS DIRECTED BY COUMADIN CLINIC 120 tablet 0  . zolpidem (AMBIEN) 10 MG tablet Take 5 mg by mouth at bedtime as needed for sleep.    Marland Kitchen ALPRAZolam (XANAX XR) 2 MG 24 hr tablet Take 2 mg by mouth as needed. Reported on 06/08/2015    . HYDROmorphone (DILAUDID) 2 MG tablet Take 2 mg by mouth every 4 (four) hours as needed for severe pain. Reported on 06/08/2015    . LORazepam (ATIVAN) 0.5 MG tablet Take 0.5 mg by mouth every 8 (eight) hours as needed for anxiety. Reported on 06/08/2015    . methocarbamol (ROBAXIN) 500 MG tablet Take 1 tablet (500 mg total) by mouth every 6 (six) hours as needed for muscle spasms. (Patient not taking: Reported on 06/08/2015) 60 tablet 0  . oxyCODONE-acetaminophen (PERCOCET/ROXICET) 5-325 MG per tablet Take 1-2 tablets by mouth every 6 (six) hours as  needed for severe pain. (Patient not taking: Reported on 06/08/2015) 60 tablet 0   No current facility-administered medications for this visit.    Allergies:   Codeine; Beta adrenergic blockers; Metoprolol; Propoxyphene; Toprol xl; and Dilantin    Social History:  The patient  reports that she has never smoked. She has never used smokeless tobacco. She reports that she does not drink alcohol or use  illicit drugs.   Family History:  The patient's family history includes Heart disease in her brother; Hyperlipidemia in her father; Hypertension in her father and mother; Lung cancer in her mother.    ROS:  Please see the history of present illness.   Otherwise, review of systems are positive for none.   All other systems are reviewed and negative.    PHYSICAL EXAM: VS:  BP 122/84 mmHg  Pulse 72  Ht 5\' 7"  (1.702 m)  Wt 230 lb (104.327 kg)  BMI 36.01 kg/m2 , BMI Body mass index is 36.01 kg/(m^2). GEN: Well nourished, well developed, in no acute distress HEENT: normal Neck: no JVD, carotid bruits, or masses Cardiac: RRR; no murmurs, rubs, or gallops.  Trace LLE edema  Respiratory:  clear to auscultation bilaterally, normal work of breathing GI: soft, nontender, nondistended, + BS MS: no deformity or atrophy Skin: warm and dry, no rash Neuro:  Strength and sensation are intact Psych: euthymic mood, full affect   EKG:  EKG was ordered today and showed NSR with first degree AV block and nonspecific ST abnormality    Recent Labs: 11/08/2014: ALT 5* 11/13/2014: Hemoglobin 8.3*; Platelets 353 12/06/2014: BUN 16; Creatinine, Ser 0.75; Potassium 4.1; Sodium 138    Lipid Panel No results found for: CHOL, TRIG, HDL, CHOLHDL, VLDL, LDLCALC, LDLDIRECT    Wt Readings from Last 3 Encounters:  06/08/15 230 lb (104.327 kg)  12/06/14 220 lb 6.4 oz (99.973 kg)  10/26/14 227 lb 3.2 oz (103.057 kg)     ASSESSMENT/PLAN:  1. Atrial fibrillation s/p afib ablation at [facility] - Maintaining NSR and QTc  stable at 491msec. - continue Tikosyn/warfarin/diltiazem  2. Chronic systemic anticoagulation  3. OSA now on CPAP and tolerating well. Her d/l today showed an AHI of 0.9/hr on 12cm H2O and 73% compliance in using more than 4 hours nightly. Average nightly usage is 5 hours and 17 minutes.  4. Obesity  5. AS s/p mechanical AVR on chronic systemic anticoagulation - she is not on ASA due to spontaneous intracranial bleed in the past with INR at 2 so just on warfarin 6. Chronic diastolic CHF - compensated  - continue Lasix  7. HTN- controlled  - continue losartan/diltiazem    Current medicines are reviewed at length with the patient today.  The patient does not have concerns regarding medicines.  The following changes have been made:  no change  Labs/ tests ordered today: See above Assessment and Plan No orders of the defined types were placed in this encounter.     Disposition:   FU with me in 6 month  Signed, [name], MD  06/08/2015 1:25 PM    [facility] [place], [place], [place]  [address] Phone: [phone]; Fax: [phone]

## 2015-06-08 ENCOUNTER — Ambulatory Visit (INDEPENDENT_AMBULATORY_CARE_PROVIDER_SITE_OTHER): Payer: Medicare Other | Admitting: Pharmacist

## 2015-06-08 ENCOUNTER — Encounter: Payer: Self-pay | Admitting: Cardiology

## 2015-06-08 ENCOUNTER — Other Ambulatory Visit: Payer: Self-pay | Admitting: Cardiology

## 2015-06-08 ENCOUNTER — Ambulatory Visit (INDEPENDENT_AMBULATORY_CARE_PROVIDER_SITE_OTHER): Payer: Medicare Other | Admitting: Cardiology

## 2015-06-08 ENCOUNTER — Ambulatory Visit (HOSPITAL_COMMUNITY): Payer: Medicare Other | Attending: Cardiology

## 2015-06-08 ENCOUNTER — Other Ambulatory Visit: Payer: Self-pay

## 2015-06-08 VITALS — BP 122/84 | HR 72 | Ht 67.0 in | Wt 230.0 lb

## 2015-06-08 DIAGNOSIS — I481 Persistent atrial fibrillation: Secondary | ICD-10-CM | POA: Diagnosis not present

## 2015-06-08 DIAGNOSIS — I4891 Unspecified atrial fibrillation: Secondary | ICD-10-CM

## 2015-06-08 DIAGNOSIS — I071 Rheumatic tricuspid insufficiency: Secondary | ICD-10-CM | POA: Insufficient documentation

## 2015-06-08 DIAGNOSIS — G4733 Obstructive sleep apnea (adult) (pediatric): Secondary | ICD-10-CM

## 2015-06-08 DIAGNOSIS — I350 Nonrheumatic aortic (valve) stenosis: Secondary | ICD-10-CM | POA: Diagnosis not present

## 2015-06-08 DIAGNOSIS — I351 Nonrheumatic aortic (valve) insufficiency: Secondary | ICD-10-CM | POA: Diagnosis not present

## 2015-06-08 DIAGNOSIS — Z952 Presence of prosthetic heart valve: Secondary | ICD-10-CM

## 2015-06-08 DIAGNOSIS — E669 Obesity, unspecified: Secondary | ICD-10-CM | POA: Diagnosis not present

## 2015-06-08 DIAGNOSIS — I340 Nonrheumatic mitral (valve) insufficiency: Secondary | ICD-10-CM | POA: Diagnosis not present

## 2015-06-08 DIAGNOSIS — I4819 Other persistent atrial fibrillation: Secondary | ICD-10-CM

## 2015-06-08 DIAGNOSIS — I5032 Chronic diastolic (congestive) heart failure: Secondary | ICD-10-CM | POA: Diagnosis not present

## 2015-06-08 DIAGNOSIS — Z954 Presence of other heart-valve replacement: Secondary | ICD-10-CM

## 2015-06-08 DIAGNOSIS — I119 Hypertensive heart disease without heart failure: Secondary | ICD-10-CM | POA: Insufficient documentation

## 2015-06-08 DIAGNOSIS — Z5181 Encounter for therapeutic drug level monitoring: Secondary | ICD-10-CM | POA: Diagnosis not present

## 2015-06-08 DIAGNOSIS — I10 Essential (primary) hypertension: Secondary | ICD-10-CM

## 2015-06-08 DIAGNOSIS — E785 Hyperlipidemia, unspecified: Secondary | ICD-10-CM | POA: Insufficient documentation

## 2015-06-08 DIAGNOSIS — I77810 Thoracic aortic ectasia: Secondary | ICD-10-CM | POA: Diagnosis not present

## 2015-06-08 DIAGNOSIS — Z6834 Body mass index (BMI) 34.0-34.9, adult: Secondary | ICD-10-CM | POA: Diagnosis not present

## 2015-06-08 DIAGNOSIS — I359 Nonrheumatic aortic valve disorder, unspecified: Secondary | ICD-10-CM

## 2015-06-08 DIAGNOSIS — I509 Heart failure, unspecified: Secondary | ICD-10-CM | POA: Diagnosis not present

## 2015-06-08 LAB — BASIC METABOLIC PANEL
BUN: 17 mg/dL (ref 7–25)
CHLORIDE: 102 mmol/L (ref 98–110)
CO2: 27 mmol/L (ref 20–31)
CREATININE: 0.74 mg/dL (ref 0.50–0.99)
Calcium: 9 mg/dL (ref 8.6–10.4)
GLUCOSE: 89 mg/dL (ref 65–99)
Potassium: 4.6 mmol/L (ref 3.5–5.3)
Sodium: 138 mmol/L (ref 135–146)

## 2015-06-08 LAB — MAGNESIUM: Magnesium: 2.3 mg/dL (ref 1.5–2.5)

## 2015-06-08 LAB — POCT INR: INR: 2

## 2015-06-08 NOTE — Patient Instructions (Signed)
Medication Instructions:  Your physician recommends that you continue on your current medications as directed. Please refer to the Current Medication list given to you today.   Labwork: TODAY: BMET, Mag  Testing/Procedures: None  Follow-Up: Your physician wants you to follow-up in: 6 months with Dr. Radford Pax. You will receive a reminder letter in the mail two months in advance. If you don't receive a letter, please call our office to schedule the follow-up appointment.   Any Other Special Instructions Will Be Listed Below (If Applicable).     If you need a refill on your cardiac medications before your next appointment, please call your pharmacy.

## 2015-06-09 ENCOUNTER — Telehealth: Payer: Self-pay

## 2015-06-09 DIAGNOSIS — Z952 Presence of prosthetic heart valve: Secondary | ICD-10-CM

## 2015-06-09 DIAGNOSIS — I5032 Chronic diastolic (congestive) heart failure: Secondary | ICD-10-CM

## 2015-06-09 DIAGNOSIS — I350 Nonrheumatic aortic (valve) stenosis: Secondary | ICD-10-CM

## 2015-06-09 DIAGNOSIS — I77810 Thoracic aortic ectasia: Secondary | ICD-10-CM

## 2015-06-09 NOTE — Telephone Encounter (Signed)
-----   Message from [name], MD sent at 06/08/2015  8:07 PM EST ----- Echo showed normal LVF with increased thickness of heart muscle, normal functioning AVR with mild AR, mildly dilated ascending aorta, mild MR - compared to last year the ascending aorta is now dilated at 4mm.  Please get a chest CT angio to evaluate further and get echo in 1 year

## 2015-06-09 NOTE — Telephone Encounter (Signed)
Informed patient of results and verbal understanding expressed.  Chest CT ordered for scheduling. Repeat ECHO ordered for scheduling in 1 year. Patient agrees with treatment plan.

## 2015-06-15 ENCOUNTER — Telehealth: Payer: Self-pay

## 2015-06-15 ENCOUNTER — Inpatient Hospital Stay: Admission: RE | Admit: 2015-06-15 | Payer: Medicare Other | Source: Ambulatory Visit

## 2015-06-15 ENCOUNTER — Ambulatory Visit (INDEPENDENT_AMBULATORY_CARE_PROVIDER_SITE_OTHER)
Admission: RE | Admit: 2015-06-15 | Discharge: 2015-06-15 | Disposition: A | Discharge status: Home / Self Care | Payer: Medicare Other | Source: Ambulatory Visit | Attending: Cardiology | Admitting: Cardiology

## 2015-06-15 DIAGNOSIS — I77819 Aortic ectasia, unspecified site: Secondary | ICD-10-CM | POA: Diagnosis not present

## 2015-06-15 DIAGNOSIS — I77810 Thoracic aortic ectasia: Secondary | ICD-10-CM | POA: Diagnosis not present

## 2015-06-15 DIAGNOSIS — I7121 Aneurysm of the ascending aorta, without rupture: Secondary | ICD-10-CM

## 2015-06-15 DIAGNOSIS — I712 Thoracic aortic aneurysm, without rupture: Secondary | ICD-10-CM

## 2015-06-15 MED ORDER — IOHEXOL 350 MG/ML SOLN
100.0000 mL | Freq: Once | INTRAVENOUS | Status: AC | PRN
  Administered 2015-06-15: 100 mL via INTRAVENOUS

## 2015-06-15 NOTE — Telephone Encounter (Signed)
-----   Message from [name], MD sent at 06/15/2015  2:58 PM EST ----- Ascending aortic aneurysm measuring 4.3cm in diameter.  Repeat Chest CT angio in 1 year for stability.  Also has a 1.8cm inferior left thyroid gland nodule and cholelithiasis.  Thyroid US recommended.  Please send this to PCP to followup on with thyroid US

## 2015-06-15 NOTE — Telephone Encounter (Signed)
Informed patient of results and verbal understanding expressed.  Repeat CTA ordered to be scheduled in 1 year. Forwarded to PCP to follow-up with thyroid US. Patient agrees with treatment plan.

## 2015-06-22 ENCOUNTER — Other Ambulatory Visit: Payer: Self-pay | Admitting: Family Medicine

## 2015-06-22 ENCOUNTER — Ambulatory Visit
Admission: RE | Admit: 2015-06-22 | Discharge: 2015-06-22 | Disposition: A | Discharge status: Home / Self Care | Payer: Medicare Other | Source: Ambulatory Visit | Attending: Family Medicine | Admitting: Family Medicine

## 2015-06-22 DIAGNOSIS — E041 Nontoxic single thyroid nodule: Secondary | ICD-10-CM

## 2015-06-22 DIAGNOSIS — E042 Nontoxic multinodular goiter: Secondary | ICD-10-CM | POA: Diagnosis not present

## 2015-06-29 ENCOUNTER — Other Ambulatory Visit: Payer: Self-pay | Admitting: Family Medicine

## 2015-06-29 DIAGNOSIS — E041 Nontoxic single thyroid nodule: Secondary | ICD-10-CM

## 2015-07-01 ENCOUNTER — Telehealth: Payer: Self-pay | Admitting: Pharmacist

## 2015-07-01 NOTE — Telephone Encounter (Signed)
-----   Message from [name], MD sent at 06/30/2015  7:31 PM EDT ----- Regarding: FW: coumadin patient Contact: [phone],   This patient has had a cerebral bleed in setting of therapeutic INR in the past and I am leary to bridge with Lovenox given this history as well as her obesity which makes dosing Lovenox more challenging.  I think the safest thing to do is bring in for IV Heparin bridge given her mechanical AVR and afib.    Traci ----- Message -----    From: Ferol Luz    Sent: 06/29/2015  11:28 AM      To: [name], MD Subject: coumadin patient                               Hi Dr. Radford Pax,  We received an order to schedule this patient for a thyroid biopsy today. In order for her to have the biopsy she has to hold the coumadin x 4 days and have an INR same day, is it possible for her to stop? If so can you put a note in epic or let me know if not possible.   Thank you for your time,  Olin Hauser

## 2015-07-01 NOTE — Telephone Encounter (Signed)
Olin Hauser,  Plase see Dr. Theodosia Blender response below.  Please let us know if MD doing procedure would like cardiology to admit for IV heparin prior to procedure and the date once it is scheduled.

## 2015-07-02 ENCOUNTER — Other Ambulatory Visit: Payer: Self-pay | Admitting: Cardiology

## 2015-07-18 ENCOUNTER — Ambulatory Visit (INDEPENDENT_AMBULATORY_CARE_PROVIDER_SITE_OTHER): Payer: Medicare Other | Admitting: Pharmacist

## 2015-07-18 DIAGNOSIS — I359 Nonrheumatic aortic valve disorder, unspecified: Secondary | ICD-10-CM

## 2015-07-18 DIAGNOSIS — I4891 Unspecified atrial fibrillation: Secondary | ICD-10-CM

## 2015-07-18 DIAGNOSIS — I481 Persistent atrial fibrillation: Secondary | ICD-10-CM | POA: Diagnosis not present

## 2015-07-18 DIAGNOSIS — I4819 Other persistent atrial fibrillation: Secondary | ICD-10-CM

## 2015-07-18 DIAGNOSIS — Z5181 Encounter for therapeutic drug level monitoring: Secondary | ICD-10-CM | POA: Diagnosis not present

## 2015-07-18 LAB — POCT INR: INR: 1.9

## 2015-07-18 NOTE — Telephone Encounter (Signed)
Pt was seen in the Coumadin Clinic today.  Her procedure is May 5th but they have agreed to allow her to remain on Coumadin for the biopsy.  No admission for heparin required.

## 2015-07-19 DIAGNOSIS — M25562 Pain in left knee: Secondary | ICD-10-CM | POA: Diagnosis not present

## 2015-08-01 ENCOUNTER — Other Ambulatory Visit: Payer: Self-pay

## 2015-08-01 DIAGNOSIS — Z1231 Encounter for screening mammogram for malignant neoplasm of breast: Secondary | ICD-10-CM

## 2015-08-06 ENCOUNTER — Other Ambulatory Visit: Payer: Self-pay | Admitting: Cardiology

## 2015-08-12 ENCOUNTER — Ambulatory Visit
Admission: RE | Admit: 2015-08-12 | Discharge: 2015-08-12 | Disposition: A | Discharge status: Home / Self Care | Payer: Medicare Other | Source: Ambulatory Visit | Attending: Family Medicine | Admitting: Family Medicine

## 2015-08-12 DIAGNOSIS — E041 Nontoxic single thyroid nodule: Secondary | ICD-10-CM

## 2015-08-17 ENCOUNTER — Other Ambulatory Visit (HOSPITAL_COMMUNITY)
Admission: RE | Admit: 2015-08-17 | Discharge: 2015-08-17 | Disposition: A | Discharge status: Home / Self Care | Payer: Medicare Other | Source: Ambulatory Visit | Attending: Interventional Radiology | Admitting: Interventional Radiology

## 2015-08-17 ENCOUNTER — Ambulatory Visit
Admission: RE | Admit: 2015-08-17 | Discharge: 2015-08-17 | Disposition: A | Discharge status: Home / Self Care | Payer: Medicare Other | Source: Ambulatory Visit | Attending: Family Medicine | Admitting: Family Medicine

## 2015-08-17 DIAGNOSIS — E041 Nontoxic single thyroid nodule: Secondary | ICD-10-CM | POA: Diagnosis not present

## 2015-08-17 DIAGNOSIS — E042 Nontoxic multinodular goiter: Secondary | ICD-10-CM | POA: Insufficient documentation

## 2015-08-22 ENCOUNTER — Ambulatory Visit
Admission: RE | Admit: 2015-08-22 | Discharge: 2015-08-22 | Disposition: A | Discharge status: Home / Self Care | Payer: Medicare Other | Source: Ambulatory Visit

## 2015-08-22 ENCOUNTER — Ambulatory Visit (INDEPENDENT_AMBULATORY_CARE_PROVIDER_SITE_OTHER): Payer: Medicare Other

## 2015-08-22 DIAGNOSIS — I4891 Unspecified atrial fibrillation: Secondary | ICD-10-CM

## 2015-08-22 DIAGNOSIS — Z5181 Encounter for therapeutic drug level monitoring: Secondary | ICD-10-CM

## 2015-08-22 DIAGNOSIS — I359 Nonrheumatic aortic valve disorder, unspecified: Secondary | ICD-10-CM

## 2015-08-22 DIAGNOSIS — I4819 Other persistent atrial fibrillation: Secondary | ICD-10-CM

## 2015-08-22 DIAGNOSIS — Z1231 Encounter for screening mammogram for malignant neoplasm of breast: Secondary | ICD-10-CM

## 2015-08-22 DIAGNOSIS — I481 Persistent atrial fibrillation: Secondary | ICD-10-CM

## 2015-08-22 LAB — POCT INR: INR: 1.8

## 2015-08-26 DIAGNOSIS — I480 Paroxysmal atrial fibrillation: Secondary | ICD-10-CM | POA: Diagnosis not present

## 2015-08-26 DIAGNOSIS — Z952 Presence of prosthetic heart valve: Secondary | ICD-10-CM | POA: Diagnosis not present

## 2015-08-26 DIAGNOSIS — I10 Essential (primary) hypertension: Secondary | ICD-10-CM | POA: Diagnosis not present

## 2015-09-12 ENCOUNTER — Ambulatory Visit (INDEPENDENT_AMBULATORY_CARE_PROVIDER_SITE_OTHER): Payer: Medicare Other | Admitting: *Deleted

## 2015-09-12 DIAGNOSIS — Z5181 Encounter for therapeutic drug level monitoring: Secondary | ICD-10-CM

## 2015-09-12 DIAGNOSIS — I359 Nonrheumatic aortic valve disorder, unspecified: Secondary | ICD-10-CM | POA: Diagnosis not present

## 2015-09-12 DIAGNOSIS — I481 Persistent atrial fibrillation: Secondary | ICD-10-CM | POA: Diagnosis not present

## 2015-09-12 DIAGNOSIS — I4819 Other persistent atrial fibrillation: Secondary | ICD-10-CM

## 2015-09-12 DIAGNOSIS — I4891 Unspecified atrial fibrillation: Secondary | ICD-10-CM | POA: Diagnosis not present

## 2015-09-12 LAB — POCT INR: INR: 2

## 2015-09-19 DIAGNOSIS — L814 Other melanin hyperpigmentation: Secondary | ICD-10-CM | POA: Diagnosis not present

## 2015-09-19 DIAGNOSIS — L570 Actinic keratosis: Secondary | ICD-10-CM | POA: Diagnosis not present

## 2015-09-19 DIAGNOSIS — L821 Other seborrheic keratosis: Secondary | ICD-10-CM | POA: Diagnosis not present

## 2015-09-19 DIAGNOSIS — D1721 Benign lipomatous neoplasm of skin and subcutaneous tissue of right arm: Secondary | ICD-10-CM | POA: Diagnosis not present

## 2015-09-19 DIAGNOSIS — D1801 Hemangioma of skin and subcutaneous tissue: Secondary | ICD-10-CM | POA: Diagnosis not present

## 2015-09-19 DIAGNOSIS — L820 Inflamed seborrheic keratosis: Secondary | ICD-10-CM | POA: Diagnosis not present

## 2015-09-19 DIAGNOSIS — L817 Pigmented purpuric dermatosis: Secondary | ICD-10-CM | POA: Diagnosis not present

## 2015-09-19 DIAGNOSIS — Z85828 Personal history of other malignant neoplasm of skin: Secondary | ICD-10-CM | POA: Diagnosis not present

## 2015-09-20 DIAGNOSIS — I779 Disorder of arteries and arterioles, unspecified: Secondary | ICD-10-CM | POA: Diagnosis not present

## 2015-09-20 DIAGNOSIS — I5032 Chronic diastolic (congestive) heart failure: Secondary | ICD-10-CM | POA: Diagnosis not present

## 2015-09-20 DIAGNOSIS — G4733 Obstructive sleep apnea (adult) (pediatric): Secondary | ICD-10-CM | POA: Diagnosis not present

## 2015-09-20 DIAGNOSIS — I10 Essential (primary) hypertension: Secondary | ICD-10-CM | POA: Diagnosis not present

## 2015-09-20 DIAGNOSIS — Z0000 Encounter for general adult medical examination without abnormal findings: Secondary | ICD-10-CM | POA: Diagnosis not present

## 2015-09-20 DIAGNOSIS — I4891 Unspecified atrial fibrillation: Secondary | ICD-10-CM | POA: Diagnosis not present

## 2015-09-20 DIAGNOSIS — E785 Hyperlipidemia, unspecified: Secondary | ICD-10-CM | POA: Diagnosis not present

## 2015-09-20 DIAGNOSIS — G4700 Insomnia, unspecified: Secondary | ICD-10-CM | POA: Diagnosis not present

## 2015-09-20 DIAGNOSIS — F331 Major depressive disorder, recurrent, moderate: Secondary | ICD-10-CM | POA: Diagnosis not present

## 2015-09-20 DIAGNOSIS — I712 Thoracic aortic aneurysm, without rupture: Secondary | ICD-10-CM | POA: Diagnosis not present

## 2015-09-26 ENCOUNTER — Ambulatory Visit (INDEPENDENT_AMBULATORY_CARE_PROVIDER_SITE_OTHER): Payer: Medicare Other | Admitting: *Deleted

## 2015-09-26 DIAGNOSIS — I359 Nonrheumatic aortic valve disorder, unspecified: Secondary | ICD-10-CM

## 2015-09-26 DIAGNOSIS — I4819 Other persistent atrial fibrillation: Secondary | ICD-10-CM

## 2015-09-26 DIAGNOSIS — Z5181 Encounter for therapeutic drug level monitoring: Secondary | ICD-10-CM | POA: Diagnosis not present

## 2015-09-26 DIAGNOSIS — I4891 Unspecified atrial fibrillation: Secondary | ICD-10-CM | POA: Diagnosis not present

## 2015-09-26 DIAGNOSIS — I481 Persistent atrial fibrillation: Secondary | ICD-10-CM

## 2015-09-26 LAB — POCT INR: INR: 2

## 2015-10-14 DIAGNOSIS — Z01818 Encounter for other preprocedural examination: Secondary | ICD-10-CM | POA: Diagnosis not present

## 2015-10-14 DIAGNOSIS — I340 Nonrheumatic mitral (valve) insufficiency: Secondary | ICD-10-CM | POA: Diagnosis not present

## 2015-10-14 DIAGNOSIS — I517 Cardiomegaly: Secondary | ICD-10-CM | POA: Diagnosis not present

## 2015-10-14 DIAGNOSIS — I4891 Unspecified atrial fibrillation: Secondary | ICD-10-CM | POA: Diagnosis not present

## 2015-10-14 DIAGNOSIS — Z952 Presence of prosthetic heart valve: Secondary | ICD-10-CM | POA: Diagnosis not present

## 2015-10-14 DIAGNOSIS — Z8512 Personal history of malignant neoplasm of trachea: Secondary | ICD-10-CM | POA: Diagnosis not present

## 2015-10-14 DIAGNOSIS — I480 Paroxysmal atrial fibrillation: Secondary | ICD-10-CM | POA: Diagnosis not present

## 2015-10-14 DIAGNOSIS — I10 Essential (primary) hypertension: Secondary | ICD-10-CM | POA: Diagnosis not present

## 2015-10-17 DIAGNOSIS — G4733 Obstructive sleep apnea (adult) (pediatric): Secondary | ICD-10-CM | POA: Diagnosis not present

## 2015-10-17 DIAGNOSIS — Z7901 Long term (current) use of anticoagulants: Secondary | ICD-10-CM | POA: Diagnosis not present

## 2015-10-17 DIAGNOSIS — R9431 Abnormal electrocardiogram [ECG] [EKG]: Secondary | ICD-10-CM | POA: Diagnosis not present

## 2015-10-17 DIAGNOSIS — I4891 Unspecified atrial fibrillation: Secondary | ICD-10-CM | POA: Diagnosis not present

## 2015-10-17 DIAGNOSIS — F329 Major depressive disorder, single episode, unspecified: Secondary | ICD-10-CM | POA: Diagnosis not present

## 2015-10-17 DIAGNOSIS — F419 Anxiety disorder, unspecified: Secondary | ICD-10-CM | POA: Diagnosis not present

## 2015-10-17 DIAGNOSIS — I5032 Chronic diastolic (congestive) heart failure: Secondary | ICD-10-CM | POA: Diagnosis not present

## 2015-10-17 DIAGNOSIS — E785 Hyperlipidemia, unspecified: Secondary | ICD-10-CM | POA: Diagnosis not present

## 2015-10-17 DIAGNOSIS — K219 Gastro-esophageal reflux disease without esophagitis: Secondary | ICD-10-CM | POA: Diagnosis not present

## 2015-10-17 DIAGNOSIS — I440 Atrioventricular block, first degree: Secondary | ICD-10-CM | POA: Diagnosis not present

## 2015-10-17 DIAGNOSIS — Z8673 Personal history of transient ischemic attack (TIA), and cerebral infarction without residual deficits: Secondary | ICD-10-CM | POA: Diagnosis not present

## 2015-10-17 DIAGNOSIS — I481 Persistent atrial fibrillation: Secondary | ICD-10-CM | POA: Diagnosis not present

## 2015-10-17 DIAGNOSIS — I4581 Long QT syndrome: Secondary | ICD-10-CM | POA: Diagnosis not present

## 2015-10-17 DIAGNOSIS — I110 Hypertensive heart disease with heart failure: Secondary | ICD-10-CM | POA: Diagnosis not present

## 2015-10-17 DIAGNOSIS — Z6835 Body mass index (BMI) 35.0-35.9, adult: Secondary | ICD-10-CM | POA: Diagnosis not present

## 2015-10-17 DIAGNOSIS — Z885 Allergy status to narcotic agent status: Secondary | ICD-10-CM | POA: Diagnosis not present

## 2015-10-18 DIAGNOSIS — I4891 Unspecified atrial fibrillation: Secondary | ICD-10-CM | POA: Diagnosis not present

## 2015-10-18 DIAGNOSIS — R9431 Abnormal electrocardiogram [ECG] [EKG]: Secondary | ICD-10-CM | POA: Diagnosis not present

## 2015-10-18 DIAGNOSIS — E785 Hyperlipidemia, unspecified: Secondary | ICD-10-CM | POA: Diagnosis not present

## 2015-10-18 DIAGNOSIS — I110 Hypertensive heart disease with heart failure: Secondary | ICD-10-CM | POA: Diagnosis not present

## 2015-10-18 DIAGNOSIS — I4581 Long QT syndrome: Secondary | ICD-10-CM | POA: Diagnosis not present

## 2015-10-18 DIAGNOSIS — F329 Major depressive disorder, single episode, unspecified: Secondary | ICD-10-CM | POA: Diagnosis not present

## 2015-10-18 DIAGNOSIS — K219 Gastro-esophageal reflux disease without esophagitis: Secondary | ICD-10-CM | POA: Diagnosis not present

## 2015-10-18 DIAGNOSIS — F419 Anxiety disorder, unspecified: Secondary | ICD-10-CM | POA: Diagnosis not present

## 2015-10-19 ENCOUNTER — Telehealth: Payer: Self-pay

## 2015-10-19 NOTE — Telephone Encounter (Signed)
Records received from [facility].  Per Dr. Radford Pax, patient to schedule EKG. Left message to call back.

## 2015-10-20 NOTE — Telephone Encounter (Signed)
Left message to call back  

## 2015-10-24 ENCOUNTER — Ambulatory Visit (INDEPENDENT_AMBULATORY_CARE_PROVIDER_SITE_OTHER): Payer: Medicare Other | Admitting: *Deleted

## 2015-10-24 DIAGNOSIS — I481 Persistent atrial fibrillation: Secondary | ICD-10-CM

## 2015-10-24 DIAGNOSIS — Z5181 Encounter for therapeutic drug level monitoring: Secondary | ICD-10-CM

## 2015-10-24 DIAGNOSIS — I4891 Unspecified atrial fibrillation: Secondary | ICD-10-CM

## 2015-10-24 DIAGNOSIS — I359 Nonrheumatic aortic valve disorder, unspecified: Secondary | ICD-10-CM

## 2015-10-24 DIAGNOSIS — I4819 Other persistent atrial fibrillation: Secondary | ICD-10-CM

## 2015-10-24 LAB — POCT INR: INR: 3

## 2015-10-25 NOTE — Telephone Encounter (Signed)
Scheduled patient 7/19 for EKG.

## 2015-10-26 ENCOUNTER — Ambulatory Visit (INDEPENDENT_AMBULATORY_CARE_PROVIDER_SITE_OTHER): Payer: Medicare Other

## 2015-10-26 VITALS — BP 110/78 | HR 74 | Resp 18 | Ht 67.0 in | Wt 225.6 lb

## 2015-10-26 DIAGNOSIS — I5032 Chronic diastolic (congestive) heart failure: Secondary | ICD-10-CM | POA: Diagnosis not present

## 2015-10-26 DIAGNOSIS — R9431 Abnormal electrocardiogram [ECG] [EKG]: Secondary | ICD-10-CM

## 2015-10-26 NOTE — Addendum Note (Signed)
Addended by: Aris Georgia, Kaili Castille L on: 10/26/2015 03:29 PM   Modules accepted: Medications, Level of Service

## 2015-10-26 NOTE — Progress Notes (Addendum)
1.) Reason for visit: EKG  2.) Name of MD requesting visit: Dr. Radford Pax  3.) H&P: Patient has history of A. Fib, CHF, AVR, HTN, obstructive sleep apnea, and morbid obesity. Patient's weight today 225.6 lbs and 5'7"   4.) ROS related to problem: Patient recently had procedure at [place] (10/17/15) with EKG changes (see media tab for scanned copy of notes). Patient's vital signs today are BP 110/78, HR74, Resp 18, and O2 94 % on room air. Today's EKG shows normal sinus rhythm with ST and T wave abnormality. Patient complaining of fatigue since 10/17/15.  5.) Assessment and plan per MD: Dr. Radford Pax reviewed patient's EKG. Ordered Myoview due to EKG changes and fatigue.

## 2015-10-26 NOTE — Patient Instructions (Signed)
Medication Instructions:  Your physician recommends that you continue on your current medications as directed. Please refer to the Current Medication list given to you today.  Labwork: NONE  Testing/Procedures: Your physician has requested that you have a lexiscan myoview. For further information please visit [email or web address]. Please follow instruction sheet, as given.  Follow-Up: Pending stress test results.   If you need a refill on your cardiac medications before your next appointment, please call your pharmacy.

## 2015-10-27 ENCOUNTER — Encounter: Payer: Self-pay | Admitting: Cardiology

## 2015-10-28 DIAGNOSIS — I4891 Unspecified atrial fibrillation: Secondary | ICD-10-CM | POA: Diagnosis not present

## 2015-10-28 DIAGNOSIS — R5383 Other fatigue: Secondary | ICD-10-CM | POA: Diagnosis not present

## 2015-10-28 DIAGNOSIS — R399 Unspecified symptoms and signs involving the genitourinary system: Secondary | ICD-10-CM | POA: Diagnosis not present

## 2015-10-28 DIAGNOSIS — Z79899 Other long term (current) drug therapy: Secondary | ICD-10-CM | POA: Diagnosis not present

## 2015-10-31 ENCOUNTER — Ambulatory Visit (HOSPITAL_COMMUNITY): Payer: Medicare Other | Attending: Internal Medicine

## 2015-10-31 ENCOUNTER — Encounter (HOSPITAL_COMMUNITY): Payer: Self-pay

## 2015-10-31 DIAGNOSIS — I5032 Chronic diastolic (congestive) heart failure: Secondary | ICD-10-CM | POA: Diagnosis not present

## 2015-10-31 DIAGNOSIS — R002 Palpitations: Secondary | ICD-10-CM | POA: Diagnosis not present

## 2015-10-31 DIAGNOSIS — R9431 Abnormal electrocardiogram [ECG] [EKG]: Secondary | ICD-10-CM

## 2015-10-31 DIAGNOSIS — R5383 Other fatigue: Secondary | ICD-10-CM | POA: Diagnosis not present

## 2015-10-31 DIAGNOSIS — R0609 Other forms of dyspnea: Secondary | ICD-10-CM | POA: Insufficient documentation

## 2015-10-31 DIAGNOSIS — R0789 Other chest pain: Secondary | ICD-10-CM | POA: Insufficient documentation

## 2015-10-31 DIAGNOSIS — I110 Hypertensive heart disease with heart failure: Secondary | ICD-10-CM | POA: Diagnosis not present

## 2015-11-01 ENCOUNTER — Ambulatory Visit (HOSPITAL_COMMUNITY): Payer: Medicare Other | Attending: Cardiology

## 2015-11-01 LAB — MYOCARDIAL PERFUSION IMAGING
CHL CUP NUCLEAR SRS: 4
CHL CUP RESTING HR STRESS: 68 {beats}/min
LV dias vol: 135 mL (ref 46–106)
LV sys vol: 57 mL
Peak HR: 78 {beats}/min
RATE: 0.36
SDS: 4
SSS: 7
TID: 1.21

## 2015-11-01 MED ORDER — TECHNETIUM TC 99M TETROFOSMIN IV KIT
32.6000 | PACK | Freq: Once | INTRAVENOUS | Status: AC | PRN
  Administered 2015-11-01: 33 via INTRAVENOUS
  Filled 2015-11-01: qty 33

## 2015-11-02 ENCOUNTER — Telehealth: Payer: Self-pay | Admitting: Cardiology

## 2015-11-02 NOTE — Telephone Encounter (Signed)
Informed pt of stress test results. Pt verbalized understanding. 

## 2015-11-02 NOTE — Telephone Encounter (Signed)
New message   Pt returning nurse call to get the results of her stress test.

## 2015-11-07 ENCOUNTER — Ambulatory Visit (INDEPENDENT_AMBULATORY_CARE_PROVIDER_SITE_OTHER): Payer: Medicare Other | Admitting: *Deleted

## 2015-11-07 DIAGNOSIS — I481 Persistent atrial fibrillation: Secondary | ICD-10-CM | POA: Diagnosis not present

## 2015-11-07 DIAGNOSIS — Z5181 Encounter for therapeutic drug level monitoring: Secondary | ICD-10-CM

## 2015-11-07 DIAGNOSIS — I4891 Unspecified atrial fibrillation: Secondary | ICD-10-CM | POA: Diagnosis not present

## 2015-11-07 DIAGNOSIS — I4819 Other persistent atrial fibrillation: Secondary | ICD-10-CM

## 2015-11-07 DIAGNOSIS — I359 Nonrheumatic aortic valve disorder, unspecified: Secondary | ICD-10-CM

## 2015-11-07 LAB — POCT INR: INR: 2.5

## 2015-11-10 DIAGNOSIS — H25012 Cortical age-related cataract, left eye: Secondary | ICD-10-CM | POA: Diagnosis not present

## 2015-11-14 ENCOUNTER — Other Ambulatory Visit: Payer: Self-pay | Admitting: Cardiology

## 2015-11-23 DIAGNOSIS — M8588 Other specified disorders of bone density and structure, other site: Secondary | ICD-10-CM | POA: Diagnosis not present

## 2015-11-23 DIAGNOSIS — E2839 Other primary ovarian failure: Secondary | ICD-10-CM | POA: Diagnosis not present

## 2015-11-28 ENCOUNTER — Ambulatory Visit (INDEPENDENT_AMBULATORY_CARE_PROVIDER_SITE_OTHER): Payer: Medicare Other | Admitting: Pharmacist

## 2015-11-28 DIAGNOSIS — I481 Persistent atrial fibrillation: Secondary | ICD-10-CM | POA: Diagnosis not present

## 2015-11-28 DIAGNOSIS — I4891 Unspecified atrial fibrillation: Secondary | ICD-10-CM

## 2015-11-28 DIAGNOSIS — I359 Nonrheumatic aortic valve disorder, unspecified: Secondary | ICD-10-CM

## 2015-11-28 DIAGNOSIS — I4819 Other persistent atrial fibrillation: Secondary | ICD-10-CM

## 2015-11-28 DIAGNOSIS — Z5181 Encounter for therapeutic drug level monitoring: Secondary | ICD-10-CM

## 2015-11-28 LAB — POCT INR: INR: 2.5

## 2015-12-02 DIAGNOSIS — I5030 Unspecified diastolic (congestive) heart failure: Secondary | ICD-10-CM | POA: Diagnosis not present

## 2015-12-02 DIAGNOSIS — I110 Hypertensive heart disease with heart failure: Secondary | ICD-10-CM | POA: Diagnosis not present

## 2015-12-02 DIAGNOSIS — Z79899 Other long term (current) drug therapy: Secondary | ICD-10-CM | POA: Diagnosis not present

## 2015-12-02 DIAGNOSIS — I481 Persistent atrial fibrillation: Secondary | ICD-10-CM | POA: Diagnosis not present

## 2015-12-02 DIAGNOSIS — Z87898 Personal history of other specified conditions: Secondary | ICD-10-CM | POA: Diagnosis not present

## 2015-12-02 DIAGNOSIS — Z885 Allergy status to narcotic agent status: Secondary | ICD-10-CM | POA: Diagnosis not present

## 2015-12-02 DIAGNOSIS — Z952 Presence of prosthetic heart valve: Secondary | ICD-10-CM | POA: Diagnosis not present

## 2015-12-02 DIAGNOSIS — Z930 Tracheostomy status: Secondary | ICD-10-CM | POA: Diagnosis not present

## 2015-12-02 DIAGNOSIS — E785 Hyperlipidemia, unspecified: Secondary | ICD-10-CM | POA: Diagnosis not present

## 2015-12-02 DIAGNOSIS — Z7901 Long term (current) use of anticoagulants: Secondary | ICD-10-CM | POA: Diagnosis not present

## 2015-12-02 DIAGNOSIS — F419 Anxiety disorder, unspecified: Secondary | ICD-10-CM | POA: Diagnosis not present

## 2015-12-02 DIAGNOSIS — E669 Obesity, unspecified: Secondary | ICD-10-CM | POA: Diagnosis not present

## 2015-12-02 DIAGNOSIS — F329 Major depressive disorder, single episode, unspecified: Secondary | ICD-10-CM | POA: Diagnosis not present

## 2015-12-02 DIAGNOSIS — G4730 Sleep apnea, unspecified: Secondary | ICD-10-CM | POA: Diagnosis not present

## 2015-12-02 DIAGNOSIS — Z96659 Presence of unspecified artificial knee joint: Secondary | ICD-10-CM | POA: Diagnosis not present

## 2015-12-09 ENCOUNTER — Ambulatory Visit: Payer: Medicare Other | Admitting: Cardiology

## 2015-12-13 ENCOUNTER — Encounter: Payer: Self-pay | Admitting: Cardiology

## 2015-12-28 ENCOUNTER — Ambulatory Visit (INDEPENDENT_AMBULATORY_CARE_PROVIDER_SITE_OTHER): Payer: Medicare Other | Admitting: Cardiology

## 2015-12-28 ENCOUNTER — Ambulatory Visit (INDEPENDENT_AMBULATORY_CARE_PROVIDER_SITE_OTHER): Payer: Medicare Other | Admitting: *Deleted

## 2015-12-28 ENCOUNTER — Encounter: Payer: Self-pay | Admitting: Cardiology

## 2015-12-28 VITALS — BP 140/80 | HR 75 | Ht 67.0 in | Wt 229.4 lb

## 2015-12-28 DIAGNOSIS — Z5181 Encounter for therapeutic drug level monitoring: Secondary | ICD-10-CM

## 2015-12-28 DIAGNOSIS — I481 Persistent atrial fibrillation: Secondary | ICD-10-CM

## 2015-12-28 DIAGNOSIS — G4733 Obstructive sleep apnea (adult) (pediatric): Secondary | ICD-10-CM

## 2015-12-28 DIAGNOSIS — I4819 Other persistent atrial fibrillation: Secondary | ICD-10-CM

## 2015-12-28 DIAGNOSIS — I10 Essential (primary) hypertension: Secondary | ICD-10-CM | POA: Diagnosis not present

## 2015-12-28 DIAGNOSIS — I480 Paroxysmal atrial fibrillation: Secondary | ICD-10-CM

## 2015-12-28 DIAGNOSIS — I5032 Chronic diastolic (congestive) heart failure: Secondary | ICD-10-CM | POA: Diagnosis not present

## 2015-12-28 DIAGNOSIS — I350 Nonrheumatic aortic (valve) stenosis: Secondary | ICD-10-CM

## 2015-12-28 DIAGNOSIS — I4891 Unspecified atrial fibrillation: Secondary | ICD-10-CM | POA: Diagnosis not present

## 2015-12-28 DIAGNOSIS — I359 Nonrheumatic aortic valve disorder, unspecified: Secondary | ICD-10-CM

## 2015-12-28 LAB — CBC WITH DIFFERENTIAL/PLATELET
BASOS ABS: 55 {cells}/uL (ref 0–200)
Basophils Relative: 1 %
EOS ABS: 165 {cells}/uL (ref 15–500)
Eosinophils Relative: 3 %
HCT: 36.3 % (ref 35.0–45.0)
HEMOGLOBIN: 12.2 g/dL (ref 11.7–15.5)
LYMPHS PCT: 25 %
Lymphs Abs: 1375 cells/uL (ref 850–3900)
MCH: 29.2 pg (ref 27.0–33.0)
MCHC: 33.6 g/dL (ref 32.0–36.0)
MCV: 86.8 fL (ref 80.0–100.0)
MONOS PCT: 12 %
MPV: 10.9 fL (ref 7.5–12.5)
Monocytes Absolute: 660 cells/uL (ref 200–950)
NEUTROS ABS: 3245 {cells}/uL (ref 1500–7800)
NEUTROS PCT: 59 %
PLATELETS: 295 10*3/uL (ref 140–400)
RBC: 4.18 MIL/uL (ref 3.80–5.10)
RDW: 13.6 % (ref 11.0–15.0)
WBC: 5.5 10*3/uL (ref 3.8–10.8)

## 2015-12-28 LAB — BASIC METABOLIC PANEL
BUN: 16 mg/dL (ref 7–25)
CALCIUM: 9.4 mg/dL (ref 8.6–10.4)
CO2: 28 mmol/L (ref 20–31)
Chloride: 100 mmol/L (ref 98–110)
Creat: 0.82 mg/dL (ref 0.60–0.93)
Glucose, Bld: 88 mg/dL (ref 65–99)
POTASSIUM: 4.2 mmol/L (ref 3.5–5.3)
SODIUM: 140 mmol/L (ref 135–146)

## 2015-12-28 LAB — POCT INR: INR: 2.2

## 2015-12-28 NOTE — Progress Notes (Signed)
Cardiology Office Note    Date:  12/28/2015   ID:  [name], DOB [birth year], MRN [id number]  PCP:  Jonathon Bellows, MD  Cardiologist:  Fransico Him, MD   Chief Complaint  Patient presents with  . Aortic Stenosis  . Congestive Heart Failure  . Hypertension  . Sleep Apnea    History of Present Illness:  [name] is a 70 y.o. female  with a history of afib s/p failed Tikosyn load and recurrent DCCV which failed to maintain NSR. She was loaded on amio and underwent recurrent DCCV to NSR  but this did not hold and she reverted back to afib. She underwent afib ablation at [facility] and is now on Tikosyn. She also has a history of OSA on CPAP at 12cm H2O, AS s/p mechanical AVR and chronic anticoagulation. She denies any anginal chest pain,dizziness or syncope.She has some mild LE edema which she thinks is from her TKR. Occasionally she will notice some mild DOE with walking the dog but she says that the SOB has improved and she is able to do more thins.  She still occasionally goes in and out of afib but seems better.  She is doing well with her CPAP. She uses the nasal pillow mask with chin strap.She feels rested in the am and has no daytime sleepiness.  She denies any significant problems with dry mouth but occasionally has some mild nasal congestion.  She sleeps through the night without any problems.    Past Medical History:  Diagnosis Date  . Anxiety   . Aortic stenosis    status post aortic valve replacement with St. Jude mechanical prosthesis  . Arthritis    "right little finger" (11/12/2012)  . Chronic anticoagulation   . 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 Depression   . DJD (degenerative joint disease) of knee   . Dyslipidemia   . Dysrhythmia   . Exertional shortness of breath   . GERD (gastroesophageal reflux disease)    "several years ago; none since" (11/12/2012)  . Hyperlipidemia   . Hypertension   . Obesity   .  Persistent atrial fibrillation ([place])    4/14 TEE cardioversion success but return to Atrial fibrillation on 07/29/12 then S/P TEE/DCCV after Tikosyn load and now back in atrial fibrillation  . PONV (postoperative nausea and vomiting)   . Sleep apnea    On CPAP at 12cm H2O  . 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 - now cleared by neurosurgery to maintain INR 2-2.5    Past Surgical History:  Procedure Laterality Date  . BURR HOLE FOR SUBDURAL HEMATOMA  2006  . CARDIAC VALVE REPLACEMENT  2006   St. Jude AVR  . CARDIOVERSION N/A 07/22/2012   Procedure: CARDIOVERSION;  Surgeon: [name], MD;  Location: [facility] ENDOSCOPY;  Service: Cardiovascular;  Laterality: N/A;  . CARDIOVERSION N/A 11/25/2012   Procedure: CARDIOVERSION;  Surgeon: [name], MD;  Location[facility] ENDOSCOPY;  Service: Cardiovascular;  Laterality: N/A;  . CARDIOVERSION N/A 12/30/2012   Procedure: CARDIOVERSION;  Surgeon: [name], MD;  Location: [facility] ENDOSCOPY;  Service: Cardiovascular;  Laterality: N/A;  h&p in file-HW   . CARDIOVERSION N/A 03/30/2013   Procedure: CARDIOVERSION;  Surgeon: [name], MD;  Location: [place];  Service: Cardiovascular;  Laterality: N/A;  . ELECTROPHYSIOLOGIC STUDY  11/2013   @ [facility]  . KNEE  ARTHROSCOPY Left 1980's   "2" (11/12/2012)  . KNEE SURGERY Left 1980's   "after 2 scopes they went in and did some kind of OR" (11/12/2012)  . TEE WITHOUT CARDIOVERSION N/A 07/22/2012   Procedure: TRANSESOPHAGEAL ECHOCARDIOGRAM (TEE);  Surgeon: [name], MD;  Location[facility] ENDOSCOPY;  Service: Cardiovascular;  Laterality: N/A;  Rm 2034  . TEE WITHOUT CARDIOVERSION N/A 10/07/2013   Procedure: TRANSESOPHAGEAL ECHOCARDIOGRAM (TEE);  Surgeon: [name], MD;  Location: [facility] ENDOSCOPY;  Service: Cardiovascular;  Laterality: N/A;  . TOTAL KNEE ARTHROPLASTY Left 11/05/2014   Procedure: TOTAL KNEE ARTHROPLASTY;  Surgeon: Dorna Leitz, MD;  Location: [place];  Service:  Orthopedics;  Laterality: Left;  . TUBAL LIGATION  1980's    Current Medications: Outpatient Medications Prior to Visit  Medication Sig Dispense Refill  . ALPRAZolam (XANAX XR) 2 MG 24 hr tablet Take 2 mg by mouth as needed. Reported on 06/08/2015    . Ascorbic Acid (VITAMIN C) 1000 MG tablet Take 2,000 mg by mouth daily.    Marland Kitchen buPROPion (WELLBUTRIN XL) 150 MG 24 hr tablet Take 300 mg by mouth daily.     . calcium-vitamin D (OSCAL WITH D) 500-200 MG-UNIT per tablet Take 2 tablets by mouth daily.     . Cholecalciferol (VITAMIN D) 2000 UNITS tablet Take 4,000 Units by mouth daily.    Marland Kitchen DILT-XR 240 MG 24 hr capsule TAKE 1 CAPSULE BY MOUTH EVERY MORNING ON AN EMPTY STOMACH 90 capsule 2  . dofetilide (TIKOSYN) 250 MCG capsule Take 250 mcg by mouth 2 (two) times daily.    . furosemide (LASIX) 80 MG tablet TAKE 1 TABLET BY MOUTH EVERY DAY 90 tablet 6  . LORazepam (ATIVAN) 0.5 MG tablet Take 0.5 mg by mouth every 8 (eight) hours as needed for anxiety. Reported on 06/08/2015    . losartan (COZAAR) 100 MG tablet Take 100 mg by mouth daily.    . Multiple Vitamin (MULTIVITAMIN WITH MINERALS) TABS Take 1 tablet by mouth daily.    . potassium chloride SA (K-DUR,KLOR-CON) 20 MEQ tablet TAKE 2 TABLETS BY MOUTH EVERY MORNING AND 1 TABLET EVERY EVENING 270 tablet 3  . pravastatin (PRAVACHOL) 10 MG tablet Take 10 mg by mouth daily.    . sertraline (ZOLOFT) 100 MG tablet Take 150 mg by mouth at bedtime.    . tretinoin (RETIN-A) 0.05 % cream Apply 1 application topically at bedtime.     Marland Kitchen warfarin (COUMADIN) 4 MG tablet TAKE 1 TO 1 AND 1/2 TABLETS BY MOUTH DAILY 120 tablet 0  . zolpidem (AMBIEN) 10 MG tablet Take 5 mg by mouth at bedtime as needed for sleep.     No facility-administered medications prior to visit.      Allergies:   Codeine; Beta adrenergic blockers; Metoprolol; Propoxyphene; Toprol xl [metoprolol tartrate]; and Dilantin [phenytoin sodium extended]   Social History   Social History  . Marital  status: Married    Spouse name: N/A  . Number of children: N/A  . Years of education: N/A   Social History Main Topics  . Smoking status: Never Smoker  . Smokeless tobacco: Never Used  . Alcohol use No     Comment: 11/12/2012 "no alcohol in the last 9 years"  . Drug use: No  . Sexual activity: Not Currently   Other Topics Concern  . None   Social History Narrative   Pt lives in [place] with spouse.  Retired     Family History:  The patient's family history includes  Heart disease in her brother; Hyperlipidemia in her father; Hypertension in her father and mother; Lung cancer in her mother.   ROS:   Please see the history of present illness.    Review of Systems  Psychiatric/Behavioral: The patient is nervous/anxious.    All other systems reviewed and are negative.  No flowsheet data found.     PHYSICAL EXAM:   VS:  BP 140/80   Pulse 75   Ht 5\' 7"  (1.702 m)   Wt 229 lb 6.4 oz (104.1 kg)   SpO2 97%   BMI 35.93 kg/m    GEN: Well nourished, well developed, in no acute distress  HEENT: normal  Neck: no JVD, carotid bruits, or masses Cardiac: RRR; no murmurs, rubs, or gallops,no edema.  Intact distal pulses bilaterally. Crisp mechanical heart sounds Respiratory:  clear to auscultation bilaterally, normal work of breathing GI: soft, nontender, nondistended, + BS MS: no deformity or atrophy  Skin: warm and dry, no rash Neuro:  Alert and Oriented x 3, Strength and sensation are intact Psych: euthymic mood, full affect  Wt Readings from Last 3 Encounters:  12/28/15 229 lb 6.4 oz (104.1 kg)  10/31/15 225 lb (102.1 kg)  10/26/15 225 lb 9.6 oz (102.3 kg)      Studies/Labs Reviewed:   EKG:  EKG is not ordered today.    Recent Labs: 06/08/2015: BUN 17; Creat 0.74; Magnesium 2.3; Potassium 4.6; Sodium 138   Lipid Panel No results found for: CHOL, TRIG, HDL, CHOLHDL, VLDL, LDLCALC, LDLDIRECT  Additional studies/ records that were reviewed today include:   none    ASSESSMENT:    1. Aortic stenosis   2. Paroxysmal atrial fibrillation (HCC)   3. Chronic diastolic CHF (congestive heart failure) ([place])   4. Essential hypertension   5. Obstructive sleep apnea      PLAN:  In order of problems listed above:  1.  AS s/p mechanical AVR.  Continue warfarin. 2.  PAF - maintaining NSR.  Continue Cardizem, Tikosyn and warfarin. 3.  Chronic diastolic CHF - she appears euvolemic on exam.  Her weight is stable. Continue Lasix and ARB. 4.  HTN - BP controlled on current meds.  Continue Cardizem/ARB. 5.  OSA - the patient is tolerating PAP therapy well without any problems.  The patient has been using and benefiting from CPAP use and will continue to benefit from therapy.   I will get a download from her DME.     Medication Adjustments/Labs and Tests Ordered: Current medicines are reviewed at length with the patient today.  Concerns regarding medicines are outlined above.  Medication changes, Labs and Tests ordered today are listed in the Patient Instructions below.  There are no Patient Instructions on file for this visit.   Signed, Fransico Him, MD  12/28/2015 9:49 AM    [facility] [address], [place], [place]  [address] Phone: [phone]; Fax: [phone]

## 2015-12-28 NOTE — Patient Instructions (Addendum)
Your physician recommends that you continue on your current medications as directed. Please refer to the Current Medication list given to you today.  Your physician recommends that you return for lab work in:  [place] wants you to follow-up in:   [place] will receive a reminder letter in the mail two months in advance. If you don't receive a letter, please call our office to schedule the follow-up appointment.

## 2015-12-29 ENCOUNTER — Telehealth: Payer: Self-pay | Admitting: Cardiology

## 2015-12-29 NOTE — Telephone Encounter (Signed)
Informed patient of lab results, verbalizes understanding and appreciation for the call.

## 2015-12-29 NOTE — Telephone Encounter (Signed)
New message ° ° ° ° °Returning nurse call.  °

## 2016-01-02 ENCOUNTER — Other Ambulatory Visit: Payer: Self-pay | Admitting: Cardiology

## 2016-01-06 DIAGNOSIS — Z23 Encounter for immunization: Secondary | ICD-10-CM | POA: Diagnosis not present

## 2016-01-16 DIAGNOSIS — Z85828 Personal history of other malignant neoplasm of skin: Secondary | ICD-10-CM | POA: Diagnosis not present

## 2016-01-16 DIAGNOSIS — I872 Venous insufficiency (chronic) (peripheral): Secondary | ICD-10-CM | POA: Diagnosis not present

## 2016-01-16 DIAGNOSIS — I8311 Varicose veins of right lower extremity with inflammation: Secondary | ICD-10-CM | POA: Diagnosis not present

## 2016-01-16 DIAGNOSIS — I8312 Varicose veins of left lower extremity with inflammation: Secondary | ICD-10-CM | POA: Diagnosis not present

## 2016-01-25 ENCOUNTER — Ambulatory Visit (INDEPENDENT_AMBULATORY_CARE_PROVIDER_SITE_OTHER): Payer: Medicare Other | Admitting: Pharmacist

## 2016-01-25 DIAGNOSIS — I4891 Unspecified atrial fibrillation: Secondary | ICD-10-CM | POA: Diagnosis not present

## 2016-01-25 DIAGNOSIS — I359 Nonrheumatic aortic valve disorder, unspecified: Secondary | ICD-10-CM | POA: Diagnosis not present

## 2016-01-25 DIAGNOSIS — Z5181 Encounter for therapeutic drug level monitoring: Secondary | ICD-10-CM

## 2016-01-25 LAB — POCT INR: INR: 2.6

## 2016-01-27 ENCOUNTER — Other Ambulatory Visit: Payer: Self-pay | Admitting: *Deleted

## 2016-01-27 MED ORDER — WARFARIN SODIUM 4 MG PO TABS: ORAL_TABLET | ORAL | 1 refills | Status: DC

## 2016-02-22 ENCOUNTER — Ambulatory Visit (INDEPENDENT_AMBULATORY_CARE_PROVIDER_SITE_OTHER): Payer: Medicare Other | Admitting: *Deleted

## 2016-02-22 DIAGNOSIS — Z5181 Encounter for therapeutic drug level monitoring: Secondary | ICD-10-CM

## 2016-02-22 DIAGNOSIS — I359 Nonrheumatic aortic valve disorder, unspecified: Secondary | ICD-10-CM

## 2016-02-22 DIAGNOSIS — I4891 Unspecified atrial fibrillation: Secondary | ICD-10-CM | POA: Diagnosis not present

## 2016-02-22 LAB — POCT INR: INR: 2.8

## 2016-03-06 ENCOUNTER — Ambulatory Visit (INDEPENDENT_AMBULATORY_CARE_PROVIDER_SITE_OTHER): Payer: Medicare Other | Admitting: *Deleted

## 2016-03-06 DIAGNOSIS — I359 Nonrheumatic aortic valve disorder, unspecified: Secondary | ICD-10-CM | POA: Diagnosis not present

## 2016-03-06 DIAGNOSIS — Z5181 Encounter for therapeutic drug level monitoring: Secondary | ICD-10-CM | POA: Diagnosis not present

## 2016-03-06 DIAGNOSIS — I4891 Unspecified atrial fibrillation: Secondary | ICD-10-CM | POA: Diagnosis not present

## 2016-03-06 LAB — POCT INR
INR: 2.6
INR: 3.6

## 2016-03-09 DIAGNOSIS — I340 Nonrheumatic mitral (valve) insufficiency: Secondary | ICD-10-CM | POA: Diagnosis not present

## 2016-03-09 DIAGNOSIS — E669 Obesity, unspecified: Secondary | ICD-10-CM | POA: Diagnosis not present

## 2016-03-09 DIAGNOSIS — F329 Major depressive disorder, single episode, unspecified: Secondary | ICD-10-CM | POA: Diagnosis not present

## 2016-03-09 DIAGNOSIS — I481 Persistent atrial fibrillation: Secondary | ICD-10-CM | POA: Diagnosis not present

## 2016-03-09 DIAGNOSIS — I4892 Unspecified atrial flutter: Secondary | ICD-10-CM | POA: Diagnosis not present

## 2016-03-09 DIAGNOSIS — Z952 Presence of prosthetic heart valve: Secondary | ICD-10-CM | POA: Diagnosis not present

## 2016-03-09 DIAGNOSIS — G4730 Sleep apnea, unspecified: Secondary | ICD-10-CM | POA: Diagnosis not present

## 2016-03-09 DIAGNOSIS — Z6836 Body mass index (BMI) 36.0-36.9, adult: Secondary | ICD-10-CM | POA: Diagnosis not present

## 2016-03-09 DIAGNOSIS — Z79899 Other long term (current) drug therapy: Secondary | ICD-10-CM | POA: Diagnosis not present

## 2016-03-09 DIAGNOSIS — Z885 Allergy status to narcotic agent status: Secondary | ICD-10-CM | POA: Diagnosis not present

## 2016-03-09 DIAGNOSIS — I5030 Unspecified diastolic (congestive) heart failure: Secondary | ICD-10-CM | POA: Diagnosis not present

## 2016-03-09 DIAGNOSIS — F419 Anxiety disorder, unspecified: Secondary | ICD-10-CM | POA: Diagnosis not present

## 2016-03-09 DIAGNOSIS — E785 Hyperlipidemia, unspecified: Secondary | ICD-10-CM | POA: Diagnosis not present

## 2016-03-09 DIAGNOSIS — Z96659 Presence of unspecified artificial knee joint: Secondary | ICD-10-CM | POA: Diagnosis not present

## 2016-03-09 DIAGNOSIS — K219 Gastro-esophageal reflux disease without esophagitis: Secondary | ICD-10-CM | POA: Diagnosis not present

## 2016-03-09 DIAGNOSIS — Z888 Allergy status to other drugs, medicaments and biological substances status: Secondary | ICD-10-CM | POA: Diagnosis not present

## 2016-03-09 DIAGNOSIS — Z8679 Personal history of other diseases of the circulatory system: Secondary | ICD-10-CM | POA: Diagnosis not present

## 2016-03-09 DIAGNOSIS — Z9889 Other specified postprocedural states: Secondary | ICD-10-CM | POA: Diagnosis not present

## 2016-03-09 DIAGNOSIS — Z7901 Long term (current) use of anticoagulants: Secondary | ICD-10-CM | POA: Diagnosis not present

## 2016-03-09 DIAGNOSIS — I110 Hypertensive heart disease with heart failure: Secondary | ICD-10-CM | POA: Diagnosis not present

## 2016-03-12 ENCOUNTER — Telehealth: Payer: Self-pay | Admitting: Cardiology

## 2016-03-12 NOTE — Telephone Encounter (Signed)
Patient states she went to her [facility] EP office Friday. Dr. Lehman Prom has moved to [place] and she saw his old assistant who is still there, Kim. She states that her EKG showed NSR at the [facility], but she still goes "in and out" of normal rhythm and when she is out, she feels awful. Kim ordered PFTs and a chest xray. Kim's plan is to STOP TIKOSYN, load amiodarone to eventual dose of 200 mg daily, and halve Coumadin until INR is checked.  The patient was supposed to start this regimen today, but wants to check with Dr. Radford Pax prior to changing medications for a second opinion.   To Dr. Radford Pax.

## 2016-03-12 NOTE — Telephone Encounter (Signed)
New message      Pt told [name] at church on 03-11-16 this message.  Patient's EP doctor at [facility] is leaving to go to [place] (I think she has seen one of our EP doctors in the past and may need to re-establish with one of them).  She is going in and out of afib.  The NP at [facility] want [name] to stop taking tikosyn and start amiodarone and cut warfarin close to half.  She is looking for directions on what she should do.  Please advise.

## 2016-03-12 NOTE — Telephone Encounter (Signed)
Is Maudie Mercury an MD or PA

## 2016-03-13 NOTE — Telephone Encounter (Signed)
Left message to call back  

## 2016-03-13 NOTE — Telephone Encounter (Signed)
I would like her to establish care with Dr. Lennie Odor or Allred.  She needs to be followed by an MD.

## 2016-03-13 NOTE — Telephone Encounter (Signed)
Kim was Dr. Chinita Pester NP. The patient has seen her for years and stated she does not wish to establish with another EP doctor here, she just wants a second opinion on current plan.

## 2016-03-14 NOTE — Telephone Encounter (Signed)
Follow Up: ° ° ° °Returning Katie's call from yesterday. °

## 2016-03-14 NOTE — Telephone Encounter (Signed)
Called patient and told her that Dr. Radford Pax would like her to establish care one of our EP doctors ([place] or Allred).  Patient was in agreement and wants to see Dr. Rayann Heman since she has seen him before.  Patient states that she has NOT made any changes to her medications yet and is still taking the Tikosyn. She will wait for either Dr. Radford Pax or Dr. Rayann Heman before making any changes. I gave the patient's information to [facility] in scheduling to get her an appointment with Dr. Rayann Heman and advised the patient that she would be contacted regarding an appointment. Patient verbalized understanding and is in agreement.

## 2016-03-15 NOTE — Telephone Encounter (Signed)
Patient has been scheduled 12/11.

## 2016-03-16 ENCOUNTER — Telehealth: Payer: Self-pay | Admitting: Cardiology

## 2016-03-16 ENCOUNTER — Encounter: Payer: Self-pay | Admitting: Internal Medicine

## 2016-03-16 NOTE — Telephone Encounter (Signed)
New message  Pt is returning call   Please return call to discuss change in medication

## 2016-03-16 NOTE — Telephone Encounter (Signed)
Left message for patient to continue current medications until she is evaluated by Dr. Rayann Heman 12/11. Instructed her to call back if she has any other questions or concerns prior to appointment.

## 2016-03-19 ENCOUNTER — Encounter: Payer: Self-pay | Admitting: Internal Medicine

## 2016-03-19 ENCOUNTER — Ambulatory Visit (INDEPENDENT_AMBULATORY_CARE_PROVIDER_SITE_OTHER): Payer: Medicare Other | Admitting: *Deleted

## 2016-03-19 ENCOUNTER — Ambulatory Visit (INDEPENDENT_AMBULATORY_CARE_PROVIDER_SITE_OTHER): Payer: Medicare Other | Admitting: Internal Medicine

## 2016-03-19 VITALS — BP 142/86 | HR 81 | Ht 67.0 in | Wt 240.8 lb

## 2016-03-19 DIAGNOSIS — I359 Nonrheumatic aortic valve disorder, unspecified: Secondary | ICD-10-CM | POA: Diagnosis not present

## 2016-03-19 DIAGNOSIS — Z5181 Encounter for therapeutic drug level monitoring: Secondary | ICD-10-CM

## 2016-03-19 DIAGNOSIS — I481 Persistent atrial fibrillation: Secondary | ICD-10-CM | POA: Diagnosis not present

## 2016-03-19 DIAGNOSIS — G4733 Obstructive sleep apnea (adult) (pediatric): Secondary | ICD-10-CM

## 2016-03-19 DIAGNOSIS — I10 Essential (primary) hypertension: Secondary | ICD-10-CM

## 2016-03-19 DIAGNOSIS — R0602 Shortness of breath: Secondary | ICD-10-CM | POA: Diagnosis not present

## 2016-03-19 DIAGNOSIS — I4891 Unspecified atrial fibrillation: Secondary | ICD-10-CM | POA: Diagnosis not present

## 2016-03-19 DIAGNOSIS — I4819 Other persistent atrial fibrillation: Secondary | ICD-10-CM

## 2016-03-19 LAB — POCT INR: INR: 3.2

## 2016-03-19 MED ORDER — AMIODARONE HCL 200 MG PO TABS: 200.0000 mg | ORAL_TABLET | Freq: Every day | ORAL | 3 refills | Status: DC

## 2016-03-19 MED ORDER — AMIODARONE HCL 200 MG PO TABS: ORAL_TABLET | ORAL | 0 refills | Status: DC

## 2016-03-19 NOTE — Patient Instructions (Addendum)
Medication Instructions:  Your physician has recommended you make the following change in your medication:  1) STOP Tikosyn 2) In 48 hours START Amiodarone 200 mg twice a day. Take for 4 weeks. 3) After 4 weeks, Take Amiodarone 200 mg daily.   Labwork: None Ordered   Testing/Procedures: Pulmonary function test   Follow-Up: Your physician recommends that you schedule a follow-up appointment in: 4 weeks with Roderic Palau, NP   Any Other Special Instructions Will Be Listed Below (If Applicable). Call with LFT lab resutls    If you need a refill on your cardiac medications before your next appointment, please call your pharmacy.

## 2016-03-19 NOTE — Progress Notes (Signed)
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 female with a h/o obesity, OSA, s/p mechanical AVR, and persistent afib who presents for EP follow-up.  She reports initially being diagnosed with atrial fibrillation 4/14 after presenting to Dr [name] office with symptoms of fatigue and decreased exercise tolerance.  She was noted to be in afib with RVR at that time.  She failed medical therapy subsequently with tikosyn and amiodarone.  She was evaluated by me in 2015.  She was noted to have moderate MR and severe LA enlargement with LA size of 20mm.  Due to extensive atriopathy and valvular heart disease as well as obesity, I advised her that anticipated success with ablation was reduced.  I encouraged that she see Dr Roxy Manns for consideration of mitral repair and surgical MAZE at that time.  She declined and was referred to [facility] for a second opinion.  She states that she underwent afib ablation 11/2013 by Dr Clyda Hurdle.  She did well for about a year before having recurrence of afib after knee surgery.  She underwent repeat ablation by Dr Clyda Hurdle 7/17.  She has continued to have afib and atypical atrial flutter since that time despite medical therapy with tikosyn.  She was more recently seen at [facility] in the EP clinic and recommendations were made for amiodarone.  She called Dr Radford Pax for her thoughts and is referred back to me for additional discussions.  She reports symptoms of fatigue, decreased exercise tolerance, and "depression" when in afib.  She has very frequent episodes and is not pleased with current health state.  She remains overweight and has not had much success with lifestyle modification.  Today, she denies symptoms of chest pain, orthopnea, PND, dizziness, presyncope, syncope, or neurologic sequela. She has occasional leg swelling.   The patient is tolerating medications without difficulties and is otherwise without complaint today.     Past Medical History:  Diagnosis Date  . Anxiety   . Aortic stenosis    status post aortic valve replacement with St. Jude mechanical prosthesis  . Chronic anticoagulation   . 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 Depression   . DJD (degenerative joint disease) of knee   . Dyslipidemia   . Exertional shortness of breath   . GERD (gastroesophageal reflux disease)    "several years ago; none since" (11/12/2012)  . Hyperlipidemia   . Hypertension   . Left atrial enlargement   . Obesity   . Persistent atrial fibrillation (HCC)    s/p afib ablation x 2 at [facility]  . Sleep apnea    On CPAP at 12cm H2O  . 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 Valvular heart disease    Past Surgical History:  Procedure Laterality Date  . BURR HOLE FOR SUBDURAL HEMATOMA  2006  . CARDIAC VALVE REPLACEMENT  2006   St. Jude AVR  . CARDIOVERSION N/A 07/22/2012   Procedure: CARDIOVERSION;  Surgeon: [name], MD;  Location: [facility] ENDOSCOPY;  Service: Cardiovascular;  Laterality: N/A;  . CARDIOVERSION N/A 11/25/2012   Procedure: CARDIOVERSION;  Surgeon: [name], MD;  Location: [facility] ENDOSCOPY;  Service: Cardiovascular;  Laterality: N/A;  . CARDIOVERSION N/A 12/30/2012   Procedure: CARDIOVERSION;  Surgeon: [name], MD;  Location: [place];  Service:  Cardiovascular;  Laterality: N/A;  h&p in file-HW   . CARDIOVERSION N/A 03/30/2013   Procedure: CARDIOVERSION;  Surgeon: [name], MD;  Location: [facility] ENDOSCOPY;  Service: Cardiovascular;  Laterality: N/A;  . ELECTROPHYSIOLOGIC STUDY  11/2013   Afib ablation x 2 (11/2013 and 10/2015) at [facility] by Dr Clyda Hurdle with recurrence post ablation  . KNEE ARTHROSCOPY Left 1980's   "2" (11/12/2012)  . KNEE SURGERY Left 1980's   "after 2 scopes they went in and did some kind of OR" (11/12/2012)  . TEE WITHOUT CARDIOVERSION N/A 07/22/2012    Procedure: TRANSESOPHAGEAL ECHOCARDIOGRAM (TEE);  Surgeon: [name], MD;  Location: [facility] ENDOSCOPY;  Service: Cardiovascular;  Laterality: N/A;  Rm 2034  . TEE WITHOUT CARDIOVERSION N/A 10/07/2013   Procedure: TRANSESOPHAGEAL ECHOCARDIOGRAM (TEE);  Surgeon: [name], MD;  Location: [facility] ENDOSCOPY;  Service: Cardiovascular;  Laterality: N/A;  . TOTAL KNEE ARTHROPLASTY Left 11/05/2014   Procedure: TOTAL KNEE ARTHROPLASTY;  Surgeon: Dorna Leitz, MD;  Location: [place];  Service: Orthopedics;  Laterality: Left;  . TUBAL LIGATION  1980's    Current Outpatient Prescriptions  Medication Sig Dispense Refill  . ALPRAZolam (XANAX XR) 2 MG 24 hr tablet Take 2 mg by mouth as needed. Reported on 06/08/2015    . Ascorbic Acid (VITAMIN C) 1000 MG tablet Take 2,000 mg by mouth daily.    . calcium-vitamin D (OSCAL WITH D) 500-200 MG-UNIT per tablet Take 2 tablets by mouth daily.     . Cholecalciferol (VITAMIN D) 2000 UNITS tablet Take 4,000 Units by mouth daily.    Marland Kitchen DILT-XR 240 MG 24 hr capsule TAKE 1 CAPSULE BY MOUTH EVERY MORNING ON AN EMPTY STOMACH 90 capsule 2  . furosemide (LASIX) 80 MG tablet TAKE 1 TABLET BY MOUTH EVERY DAY 90 tablet 3  . LORazepam (ATIVAN) 0.5 MG tablet Take 0.5 mg by mouth every 8 (eight) hours as needed for anxiety. Reported on 06/08/2015    . losartan (COZAAR) 100 MG tablet Take 100 mg by mouth daily.    . Multiple Vitamin (MULTIVITAMIN WITH MINERALS) TABS Take 1 tablet by mouth daily.    . potassium chloride SA (K-DUR,KLOR-CON) 20 MEQ tablet TAKE 2 TABLETS BY MOUTH EVERY MORNING AND 1 TABLET EVERY EVENING 270 tablet 3  . pravastatin (PRAVACHOL) 10 MG tablet Take 10 mg by mouth daily.    . sertraline (ZOLOFT) 100 MG tablet Take 150 mg by mouth at bedtime.    . tretinoin (RETIN-A) 0.05 % cream Apply 1 application topically at bedtime.     Marland Kitchen warfarin (COUMADIN) 4 MG tablet Take as directed by Coumadin Clinic 135 tablet 1  . zolpidem (AMBIEN) 10 MG tablet Take 5 mg by mouth at bedtime as  needed for sleep.    Marland Kitchen amiodarone (PACERONE) 200 MG tablet Take 1 tablet (200 mg) by mouth twice a day for 4 weeks 56 tablet 0  . [START ON 04/16/2016] amiodarone (PACERONE) 200 MG tablet Take 1 tablet (200 mg total) by mouth daily. 30 tablet 3   No current facility-administered medications for this visit.     Allergies  Allergen Reactions  . Codeine Anaphylaxis    Jerking, involuntary jerking   . Beta Adrenergic Blockers     Makes her crazy Makes her crazy   . Metoprolol     depression depression  . Propoxyphene Other (See Comments)    Bad vivid dreams Bad vivid dreams  . Toprol Xl [Metoprolol Tartrate]     depression  . Dilantin [Phenytoin  Sodium Extended] Rash and Itching    Social History   Social History  . Marital status: Married    Spouse name: N/A  . Number of children: N/A  . Years of education: N/A   Occupational History  . Not on file.   Social History Main Topics  . Smoking status: Never Smoker  . Smokeless tobacco: Never Used  . Alcohol use No     Comment: 11/12/2012 "no alcohol in the last 9 years"  . Drug use: No  . Sexual activity: Not Currently   Other Topics Concern  . Not on file   Social History Narrative   Pt lives in [place] with spouse.  Retired    Family History  Problem Relation Age of Onset  . Lung cancer Mother   . Hypertension Mother   . Hyperlipidemia Father   . Hypertension Father   . Heart disease Brother     ROS- All systems are reviewed and negative except as per the HPI above  Physical Exam: Vitals:   03/19/16 0900  BP: (!) 142/86  Pulse: 81  SpO2: 96%  Weight: 240 lb 12.8 oz (109.2 kg)  Height: 5\' 7"  (1.702 m)    GEN- The patient is overweight appearing, alert and oriented x 3 today.   Head- normocephalic, atraumatic Eyes-  Sclera clear, conjunctiva pink Ears- hearing intact Oropharynx- clear Neck- supple, no JVP Lungs- Clear to ausculation bilaterally, normal work of breathing Heart- regular rate and  rhythm, mechanical S2 GI- soft, NT, ND, + BS Extremities- no clubbing, cyanosis, trace edema MS- no significant deformity or atrophy Skin- no rash or lesion Psych- euthymic mood, full affect Neuro- strength and sensation are intact  EKG today reveals sinus rhythm 79 bpm, PR 184 msec, QRS 86 msec, Qtc 438 msec, nonspecific St/T changes  Assessment and Plan:  1. Persistent afib The patient has symptomatic persistent afib.  She has failed medical therapy with beta blockers, calcium channel blockers, tikosyn, and amioadrone.  She has extensive atriopathy/ left atrial enlargement in the setting of valvular heart disease.  She has had afib ablation x 2 at [facility] by Dr Clyda Hurdle but continues to have afib despite tikosyn.  Therapeutic strategies for afib were discussed in detail with the patient today.  I would not advise additional endovascular ablation given reduced success long term.  Today, we discussed rate control, continued tikosyn, amiodarone, or referral to Dr Roxy Manns for consideration of MAZE.  She is presently quite clear that she does not wish to have additional procedures.  I think that this is probably reasonable.  She was recently seen at [facility] and has been started on amiodarone.  She has not yet had her prescription filled.  Risks, benefits, and alternatives to amiodarone including occular, thyroid, liver, and lung toxicity were discussed.  Possible death related to amiodarone was also discussed.  She understands risks and wishes to proceed.  I will therefore stop tikosyn and start amiodarone 200mg  BID after 48 hour washout.  LFTs and TFTs were obtained recently at [facility].  I have asked that she have these sent to [place].  PFTs are ordered today.  She is aware that amiodarone will interact with coumadin and therefore she needs close coumadin follow-up in the anticoagulation clinic.  2. HTN Stable No change required today  3. OSA Compliance with CPAP is encouraged  4. Obesity Weight loss  advised Body mass index is 37.71 kg/m. Cardiofit and Arrest AF results discussed with her today highlighting the importance of lifestyle  modification in order to maintain sinus rhythm long term.  Today, I have spent 40 minutes with the patient discussing afib management .  More than 50% of the visit time today was spent on this issue.    Follow-up in AF clinic with Butch Penny in 4 weeks.  Would advise reducing amiodarone to 200mg  daily at that time. She will follow with Butch Penny in the AF clinic and with Dr Radford Pax and I will see as needed.  Thompson Grayer MD, [facility] 03/19/2016 11:06 AM

## 2016-03-21 ENCOUNTER — Ambulatory Visit: Payer: Medicare Other

## 2016-03-21 ENCOUNTER — Encounter (INDEPENDENT_AMBULATORY_CARE_PROVIDER_SITE_OTHER): Payer: Medicare Other | Admitting: Internal Medicine

## 2016-03-21 ENCOUNTER — Other Ambulatory Visit: Payer: Self-pay | Admitting: Internal Medicine

## 2016-03-21 DIAGNOSIS — R0600 Dyspnea, unspecified: Secondary | ICD-10-CM

## 2016-03-23 NOTE — Addendum Note (Signed)
Addended by: Bobby Rumpf C on: 03/23/2016 10:02 AM   Modules accepted: Orders

## 2016-03-29 ENCOUNTER — Telehealth: Payer: Self-pay | Admitting: *Deleted

## 2016-03-29 DIAGNOSIS — J984 Other disorders of lung: Secondary | ICD-10-CM

## 2016-03-29 NOTE — Telephone Encounter (Signed)
-----   Message from Thompson Grayer, MD sent at 03/22/2016  7:44 AM EST ----- Results reviewed.  [name], please inform pt of result.  She has advanced lung disease.  Please refer to pulmonary for evaluation. I will route to primary care also.

## 2016-03-29 NOTE — Telephone Encounter (Signed)
Patient informed and verbalized understanding of plan. 

## 2016-03-30 ENCOUNTER — Ambulatory Visit (INDEPENDENT_AMBULATORY_CARE_PROVIDER_SITE_OTHER): Payer: Medicare Other | Admitting: *Deleted

## 2016-03-30 DIAGNOSIS — I4891 Unspecified atrial fibrillation: Secondary | ICD-10-CM | POA: Diagnosis not present

## 2016-03-30 DIAGNOSIS — I359 Nonrheumatic aortic valve disorder, unspecified: Secondary | ICD-10-CM | POA: Diagnosis not present

## 2016-03-30 DIAGNOSIS — Z5181 Encounter for therapeutic drug level monitoring: Secondary | ICD-10-CM

## 2016-03-30 LAB — POCT INR: INR: 2.8

## 2016-04-03 ENCOUNTER — Encounter: Payer: Self-pay | Admitting: Internal Medicine

## 2016-04-03 ENCOUNTER — Ambulatory Visit (INDEPENDENT_AMBULATORY_CARE_PROVIDER_SITE_OTHER): Payer: Medicare Other | Admitting: Internal Medicine

## 2016-04-03 ENCOUNTER — Other Ambulatory Visit (INDEPENDENT_AMBULATORY_CARE_PROVIDER_SITE_OTHER): Payer: Medicare Other

## 2016-04-03 ENCOUNTER — Ambulatory Visit (INDEPENDENT_AMBULATORY_CARE_PROVIDER_SITE_OTHER)
Admission: RE | Admit: 2016-04-03 | Discharge: 2016-04-03 | Disposition: A | Discharge status: Home / Self Care | Payer: Medicare Other | Source: Ambulatory Visit | Attending: Internal Medicine | Admitting: Internal Medicine

## 2016-04-03 VITALS — BP 140/70 | HR 72 | Ht 67.0 in | Wt 233.4 lb

## 2016-04-03 DIAGNOSIS — R05 Cough: Secondary | ICD-10-CM | POA: Diagnosis not present

## 2016-04-03 DIAGNOSIS — J45991 Cough variant asthma: Secondary | ICD-10-CM

## 2016-04-03 DIAGNOSIS — R0609 Other forms of dyspnea: Secondary | ICD-10-CM | POA: Diagnosis not present

## 2016-04-03 DIAGNOSIS — J328 Other chronic sinusitis: Secondary | ICD-10-CM

## 2016-04-03 LAB — CBC WITH DIFFERENTIAL/PLATELET
BASOS PCT: 0.6 % (ref 0.0–3.0)
Basophils Absolute: 0 10*3/uL (ref 0.0–0.1)
EOS ABS: 0.1 10*3/uL (ref 0.0–0.7)
Eosinophils Relative: 2.6 % (ref 0.0–5.0)
HEMATOCRIT: 37.5 % (ref 36.0–46.0)
HEMOGLOBIN: 12.9 g/dL (ref 12.0–15.0)
LYMPHS PCT: 27.2 % (ref 12.0–46.0)
Lymphs Abs: 1.5 10*3/uL (ref 0.7–4.0)
MCHC: 34.3 g/dL (ref 30.0–36.0)
MCV: 86.1 fl (ref 78.0–100.0)
MONOS PCT: 13 % — AB (ref 3.0–12.0)
Monocytes Absolute: 0.7 10*3/uL (ref 0.1–1.0)
NEUTROS ABS: 3.1 10*3/uL (ref 1.4–7.7)
Neutrophils Relative %: 56.6 % (ref 43.0–77.0)
PLATELETS: 261 10*3/uL (ref 150.0–400.0)
RBC: 4.36 Mil/uL (ref 3.87–5.11)
RDW: 14.4 % (ref 11.5–15.5)
WBC: 5.4 10*3/uL (ref 4.0–10.5)

## 2016-04-03 LAB — NITRIC OXIDE: Nitric Oxide: 7

## 2016-04-03 LAB — SEDIMENTATION RATE: Sed Rate: 28 mm/hr (ref 0–30)

## 2016-04-03 MED ORDER — AMOXICILLIN-POT CLAVULANATE 875-125 MG PO TABS: 1.0000 | ORAL_TABLET | Freq: Two times a day (BID) | ORAL | 0 refills | Status: AC

## 2016-04-03 NOTE — Patient Instructions (Addendum)
Augmentin 875 mg take one pill twice daily  X 10 days - take at breakfast and supper with large glass of water.  It would help reduce the usual side effects (diarrhea and yeast infections) if you ate cultured yogurt at lunch.  Need to let the coumadin clinic know you are on this medication  Ocean nasal spray as much as you want   Please remember to go to the lab and x-ray department downstairs for your tests - we will call you with the results when they are available.     Please schedule a follow up office visit in 8 weeks, call sooner if needed with pfts on return

## 2016-04-03 NOTE — Progress Notes (Signed)
Subjective:     Patient ID: [name], female   DOB: 09-15-1945,   MRN: [id number]  HPI  25 yowf never smoker s/p AVR in 2006 then s/p LKR July 2016 and since then in and out of Afib assoc with sob / fatigue can last a few secs - days then feel fine in between referred to pulmonary clinic 04/03/2016 by Dr   Rayann Heman with abn pfts with h/o amiodarone exposure first in 2014 and restarted 03/19/16     04/03/2016 [facility] office visit/ [name]   Chief Complaint  Patient presents with  . Pulmonary Consult    Dr. Rayann Heman sent pt here, PFT showed advanced lung disease, pt states is fatigued and SOB   on best best days can walk the neighborhood x 7-8 min includes some hills for the first time as previously lived [place] neighborhood and ever since new walk has noted more sob, but only on the hills  Sleeps with cpap feeling fine/ [name] Each am wakes up with cough productive mucus is yellowy green x "a while" =  months / Not years assoc with nasal congestion but no wheezing   No obvious day to day or daytime variability or assoc   mucus plugs or hemoptysis or cp or chest tightness, subjective wheeze or overt  hb symptoms. No unusual exp hx or h/o childhood pna/ asthma or knowledge of premature birth.  Sleeping ok without nocturnal  or early am exacerbation  of respiratory  c/o's or need for noct saba. Also denies any obvious fluctuation of symptoms with weather or environmental changes or other aggravating or alleviating factors except as outlined above   Current Medications, Allergies, Complete Past Medical History, Past Surgical History, Family History, and Social History were reviewed in [employer] record.  ROS  The following are not active complaints unless bolded sore throat, dysphagia, dental problems, itching, sneezing,  nasal congestion or excess/ purulent secretions, ear ache,   fever, chills, sweats, unintended wt loss, classically  pleuritic or exertional cp,  orthopnea pnd or leg swelling, presyncope, palpitations, abdominal pain, anorexia, nausea, vomiting, diarrhea  or change in bowel or bladder habits, change in stools or urine, dysuria,hematuria,  rash, arthralgias, visual complaints, headache, numbness, weakness or ataxia or problems with walking or coordination,  change in mood/affect or memory.           Review of Systems     Objective:   Physical Exam    amb wf nad   Wt Readings from Last 3 Encounters:  04/03/16 233 lb 6.4 oz (105.9 kg)  03/19/16 240 lb 12.8 oz (109.2 kg)  12/28/15 229 lb 6.4 oz (104.1 kg)    Vital signs reviewed  - Note on arrival 02 sats  96% on RA     HEENT: nl dentition,  and oropharynx. Nl external ear canals without cough reflex -  moderate bilateral non-specific turbinate edema  L >R   NECK :  without JVD/Nodes/TM/ nl carotid upstrokes bilaterally   LUNGS: no acc muscle use,  Nl contour chest which is clear to A and P bilaterally without cough on insp or exp maneuvers   CV:  RRR mechanical S1  no s3 or murmur or increase in P2, nad no edema   ABD:  soft and nontender with nl inspiratory excursion in the supine position. No bruits or organomegaly appreciated, bowel sounds nl  MS:  Nl gait/ ext warm without deformities, calf tenderness, cyanosis or clubbing  No obvious joint restrictions   SKIN: warm and dry without lesions    NEURO:  alert, approp, nl sensorium with  no motor or cerebellar deficits apparent.     CXR PA and Lateral:   04/03/2016 :    I personally reviewed images and agree with radiology impression as follows:     Enlargement of cardiac silhouette post AVR.  Bronchitic changes with minimal bibasilar atelectasis.  Aortic atherosclerosis.    Labs ordered 04/03/2016   Esr/ allergy profile    Assessment:

## 2016-04-04 DIAGNOSIS — J329 Chronic sinusitis, unspecified: Secondary | ICD-10-CM | POA: Insufficient documentation

## 2016-04-04 DIAGNOSIS — R0609 Other forms of dyspnea: Principal | ICD-10-CM

## 2016-04-04 LAB — RESPIRATORY ALLERGY PROFILE REGION II ~~LOC~~
Allergen, A. alternata, m6: <0.1 kU/L
Allergen, C. Herbarum, M2: <0.1 kU/L
Allergen, D pternoyssinus,d7: <0.1 kU/L
Allergen, Mulberry, t76: <0.1 kU/L
Aspergillus fumigatus, m3: <0.1 kU/L
Box Elder IgE: <0.1 kU/L
Cat Dander: <0.1 kU/L
Cockroach: <0.1 kU/L
D. farinae: <0.1 kU/L
Dog Dander: <0.1 kU/L
Elm IgE: <0.1 kU/L
IGE (IMMUNOGLOBULIN E), SERUM: 17 kU/L (ref <115)
Johnson Grass: <0.1 kU/L
Pecan/Hickory Tree IgE: <0.1 kU/L
Rough Pigweed  IgE: <0.1 kU/L
Sheep Sorrel IgE: <0.1 kU/L
Timothy Grass: <0.1 kU/L

## 2016-04-04 NOTE — Assessment & Plan Note (Signed)
Amiodarone restarted 03/19/16   Lab Results  Component Value Date   ESRSEDRATE 28 04/03/2016     No evidence of significant amiodarone toxicity at this point though obviously very early in course and has had exposure 2 years ago as well  Patients typically have been on amiodarone for 6-12 months before this complication manifests.  Of note, serial clinical evaluation for symptoms such as cough dyspnea or fevers is  the preferred method of monitoring for pulmonary toxicity because a decrease in DLCO or lung volumes is a nonspecific for toxicity. Pathologically amiodarone pulmonary toxicity may appear as interstitial pneumonitis, eosinophilic pneumonia, organizing pneumonia, pulmonary fibrosis or less commonly as diffuse alveolar hemorrhage, pulmonary nodules or pleural effusions.  Risk factors for pulmonary toxicity include age greater than 66, daily dose greater than equal to 400 mg, a high cumulative dose, or pre-existing lung disease    She is walking around her neighborhood regularly and this is great so she can monitor her progress/ sats with exercise and give [place] an early warning if she notes any decline at all  Discussed in detail all the  indications, usual  risks and alternatives  relative to the benefits with patient who agrees to proceed with serial f/u as outlined  With f/u pfts in 3 m  Total time devoted to counseling  > 50 % of office visit:  review case including very extensive cardiology / medication records with pt/ discussion of options/alternatives/ personally creating written customized instructions  in presence of pt  then going over those specific  Instructions directly with the pt including how to use all of the meds but in particular covering each new medication in detail and the difference between the maintenance/automatic meds and the prns using an action plan format for the latter.  Please see AVS from this visit for a full list of these instructions

## 2016-04-04 NOTE — Assessment & Plan Note (Signed)
rx augmentin x 10 days and then sinus ct prn 04/03/2016 >>>  Advised to let coumadin clinic know she's on augmentin

## 2016-04-04 NOTE — Assessment & Plan Note (Addendum)
PFT's  03/21/2016  FEV1 1.50 (58 % ) ratio 80  p 14 % improvement from saba p nothing prior to study with DLCO  65/66c % corrects to 94  % for alv volume   - FENO 04/03/2016  =   7  Allergy profile 04/03/2016 >  Eos 0.1 /  IgE     Treating this with any form of saba is problematic given her issues with AFib and it may well be that she has predominantly sinobronchial syndrome anyway where if we address the sinus issue adequately the asthma will be minimized  In any case she appears to have only  very mild underlying asthma and I doubt it's really contributing much to her symptoms at this point  so rec address possible sinus dz first then regroup in about 3 months to do surveillance for amiodarone exp (see separate a/p)

## 2016-04-04 NOTE — Assessment & Plan Note (Signed)
Complicated by osa   Body mass index is 36.56 kg/m.  Lab Results  Component Value Date   TSH 2.48 01/20/2013     Contributing to gerd tendency/ doe/reviewed the need and the process to achieve and maintain neg calorie balance > defer f/u primary care including intermittently monitoring thyroid status

## 2016-04-05 NOTE — Progress Notes (Signed)
Spoke with pt and notified of results per Dr. Wert. Pt verbalized understanding and denied any questions. 

## 2016-04-11 ENCOUNTER — Ambulatory Visit (INDEPENDENT_AMBULATORY_CARE_PROVIDER_SITE_OTHER): Payer: Medicare Other | Admitting: *Deleted

## 2016-04-11 DIAGNOSIS — I359 Nonrheumatic aortic valve disorder, unspecified: Secondary | ICD-10-CM | POA: Diagnosis not present

## 2016-04-11 DIAGNOSIS — I4891 Unspecified atrial fibrillation: Secondary | ICD-10-CM | POA: Diagnosis not present

## 2016-04-11 DIAGNOSIS — Z5181 Encounter for therapeutic drug level monitoring: Secondary | ICD-10-CM

## 2016-04-11 LAB — POCT INR: INR: 3.5

## 2016-04-16 ENCOUNTER — Ambulatory Visit (HOSPITAL_COMMUNITY)
Admission: RE | Admit: 2016-04-16 | Discharge: 2016-04-16 | Disposition: A | Discharge status: Home / Self Care | Payer: Medicare Other | Source: Ambulatory Visit | Attending: Nurse Practitioner | Admitting: Nurse Practitioner

## 2016-04-16 ENCOUNTER — Encounter (HOSPITAL_COMMUNITY): Payer: Self-pay | Admitting: Nurse Practitioner

## 2016-04-16 VITALS — BP 126/78 | HR 69 | Ht 67.0 in | Wt 236.8 lb

## 2016-04-16 DIAGNOSIS — F419 Anxiety disorder, unspecified: Secondary | ICD-10-CM | POA: Diagnosis not present

## 2016-04-16 DIAGNOSIS — K219 Gastro-esophageal reflux disease without esophagitis: Secondary | ICD-10-CM | POA: Diagnosis not present

## 2016-04-16 DIAGNOSIS — Z952 Presence of prosthetic heart valve: Secondary | ICD-10-CM | POA: Diagnosis not present

## 2016-04-16 DIAGNOSIS — Z9889 Other specified postprocedural states: Secondary | ICD-10-CM | POA: Insufficient documentation

## 2016-04-16 DIAGNOSIS — E669 Obesity, unspecified: Secondary | ICD-10-CM | POA: Diagnosis not present

## 2016-04-16 DIAGNOSIS — M1710 Unilateral primary osteoarthritis, unspecified knee: Secondary | ICD-10-CM | POA: Diagnosis not present

## 2016-04-16 DIAGNOSIS — G4733 Obstructive sleep apnea (adult) (pediatric): Secondary | ICD-10-CM | POA: Diagnosis not present

## 2016-04-16 DIAGNOSIS — F329 Major depressive disorder, single episode, unspecified: Secondary | ICD-10-CM | POA: Insufficient documentation

## 2016-04-16 DIAGNOSIS — I350 Nonrheumatic aortic (valve) stenosis: Secondary | ICD-10-CM | POA: Insufficient documentation

## 2016-04-16 DIAGNOSIS — E785 Hyperlipidemia, unspecified: Secondary | ICD-10-CM | POA: Diagnosis not present

## 2016-04-16 DIAGNOSIS — I481 Persistent atrial fibrillation: Secondary | ICD-10-CM | POA: Diagnosis not present

## 2016-04-16 DIAGNOSIS — I10 Essential (primary) hypertension: Secondary | ICD-10-CM | POA: Diagnosis not present

## 2016-04-16 DIAGNOSIS — G4730 Sleep apnea, unspecified: Secondary | ICD-10-CM | POA: Diagnosis not present

## 2016-04-16 DIAGNOSIS — Z7901 Long term (current) use of anticoagulants: Secondary | ICD-10-CM | POA: Insufficient documentation

## 2016-04-16 DIAGNOSIS — I4819 Other persistent atrial fibrillation: Secondary | ICD-10-CM

## 2016-04-16 NOTE — Progress Notes (Signed)
Primary Care Physician: Jonathon Bellows, MD Referring Physician:Dr. Allred Cardiologist: Dr. Daneil Dolin is a 71 y.o. female with a h/o obesity, OSA, s/p mechanical AVR, and persistent afib who presents for for follow-up in the afib clinic.[name]  She reports initially being diagnosed with atrial fibrillation 4/14 after presenting to Dr [name] office with symptoms of fatigue and decreased exercise tolerance.  She was noted to be in afib with RVR at that time.  She failed medical therapy subsequently with tikosyn and amiodarone.  She was evaluated by Dr. Rayann Heman in 2015.  She was noted to have moderate MR and severe LA enlargement with LA size of 98mm.  Due to extensive atriopathy and valvular heart disease as well as obesity, I advised her that anticipated success with ablation was reduced.  I encouraged that she see Dr Roxy Manns for consideration of mitral repair and surgical MAZE at that time.  She declined and was referred to [facility] for a second opinion.  She states that she underwent afib ablation 11/2013 by Dr Clyda Hurdle.  She did well for about a year before having recurrence of afib after knee surgery.  She underwent repeat ablation by Dr Clyda Hurdle 7/17.  She has continued to have afib and atypical atrial flutter since that time despite medical therapy with tikosyn.  She was more recently seen at [facility] in the EP clinic and recommendations were made for amiodarone.  She called Dr Radford Pax for her thoughts and was referred back to Dr. Rayann Heman for additional discussions.  He did encourage her to start amiodarone after explanation of risk vrs benefit. She has been loading for almost one month and is aware when to reduce drug to once a day. She is in NSR today and has not felt any afib since 12/27. She is compliant with cpap. No tobacco, alcohol or excessive caffeine. She walks her dog daily when the weather is agreeable and struggles with her weight. Interested in the weight management class this Friday.  Continues with warfarin, no bleeding issues. Her baseline PFT's were abnormal and pt had evaluation with Dr. Melvyn Novas and thought to be OK to continue with amiodarone, possibly mild asthma.  Today, she denies symptoms of palpitations, chest pain, shortness of breath, orthopnea, PND, lower extremity edema, dizziness, presyncope, syncope, or neurologic sequela. The patient is tolerating medications without difficulties and is otherwise without complaint today.   Past Medical History:  Diagnosis Date  . Anxiety   . Aortic stenosis    status post aortic valve replacement with St. Jude mechanical prosthesis  . Chronic anticoagulation   . 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 Depression   . DJD (degenerative joint disease) of knee   . Dyslipidemia   . Exertional shortness of breath   . GERD (gastroesophageal reflux disease)    "several years ago; none since" (11/12/2012)  . Hyperlipidemia   . Hypertension   . Left atrial enlargement   . Obesity   . Persistent atrial fibrillation (HCC)    s/p afib ablation x 2 at [facility]  . Sleep apnea    On CPAP at 12cm H2O  . 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 Valvular heart disease    Past Surgical History:  Procedure Laterality Date  . BURR HOLE FOR SUBDURAL HEMATOMA  2006  . CARDIAC VALVE REPLACEMENT  2006   St. Jude AVR  . CARDIOVERSION N/A 07/22/2012   Procedure: CARDIOVERSION;  Surgeon: [name], MD;  Location: [facility] ENDOSCOPY;  Service: Cardiovascular;  Laterality: N/A;  . CARDIOVERSION N/A 11/25/2012   Procedure: CARDIOVERSION;  Surgeon: [name], MD;  Location: [facility] ENDOSCOPY;  Service: Cardiovascular;  Laterality: N/A;  . CARDIOVERSION N/A 12/30/2012   Procedure: CARDIOVERSION;  Surgeon: [name], MD;  Location: [facility] ENDOSCOPY;  Service: Cardiovascular;  Laterality: N/A;  h&p in file-HW   . CARDIOVERSION N/A  03/30/2013   Procedure: CARDIOVERSION;  Surgeon: [name], MD;  Location: [facility] ENDOSCOPY;  Service: Cardiovascular;  Laterality: N/A;  . ELECTROPHYSIOLOGIC STUDY  11/2013   Afib ablation x 2 (11/2013 and 10/2015) at [facility] by Dr Clyda Hurdle with recurrence post ablation  . KNEE ARTHROSCOPY Left 1980's   "2" (11/12/2012)  . KNEE SURGERY Left 1980's   "after 2 scopes they went in and did some kind of OR" (11/12/2012)  . TEE WITHOUT CARDIOVERSION N/A 07/22/2012   Procedure: TRANSESOPHAGEAL ECHOCARDIOGRAM (TEE);  Surgeon: [name], MD;  Location: [facility] ENDOSCOPY;  Service: Cardiovascular;  Laterality: N/A;  Rm 2034  . TEE WITHOUT CARDIOVERSION N/A 10/07/2013   Procedure: TRANSESOPHAGEAL ECHOCARDIOGRAM (TEE);  Surgeon: [name], MD;  Location: [facility] ENDOSCOPY;  Service: Cardiovascular;  Laterality: N/A;  . TOTAL KNEE ARTHROPLASTY Left 11/05/2014   Procedure: TOTAL KNEE ARTHROPLASTY;  Surgeon: Dorna Leitz, MD;  Location: [place];  Service: Orthopedics;  Laterality: Left;  . TUBAL LIGATION  1980's    Current Outpatient Prescriptions  Medication Sig Dispense Refill  . ALPRAZolam (XANAX XR) 2 MG 24 hr tablet Take 2 mg by mouth as needed. Reported on 06/08/2015    . amiodarone (PACERONE) 200 MG tablet Take 1 tablet (200 mg) by mouth twice a day for 4 weeks 56 tablet 0  . Ascorbic Acid (VITAMIN C) 1000 MG tablet Take 2,000 mg by mouth daily.    . calcium-vitamin D (OSCAL WITH D) 500-200 MG-UNIT per tablet Take 2 tablets by mouth daily.     . Cholecalciferol (VITAMIN D) 2000 UNITS tablet Take 4,000 Units by mouth daily.    [name] DILT-XR 240 MG 24 hr capsule TAKE 1 CAPSULE BY MOUTH EVERY MORNING ON AN EMPTY STOMACH 90 capsule 2  . furosemide (LASIX) 80 MG tablet TAKE 1 TABLET BY MOUTH EVERY DAY 90 tablet 3  . LORazepam (ATIVAN) 0.5 MG tablet Take 0.5 mg by mouth every 8 (eight) hours as needed for anxiety. Reported on 06/08/2015    . losartan (COZAAR) 100 MG tablet Take 100 mg by mouth daily.    . Multiple Vitamin  (MULTIVITAMIN WITH MINERALS) TABS Take 1 tablet by mouth daily.    . potassium chloride SA (K-DUR,KLOR-CON) 20 MEQ tablet TAKE 2 TABLETS BY MOUTH EVERY MORNING AND 1 TABLET EVERY EVENING 270 tablet 3  . pravastatin (PRAVACHOL) 10 MG tablet Take 10 mg by mouth daily.    . sertraline (ZOLOFT) 100 MG tablet Take 150 mg by mouth at bedtime.    . tretinoin (RETIN-A) 0.05 % cream Apply 1 application topically at bedtime.     [name] warfarin (COUMADIN) 4 MG tablet Take as directed by [facility] 135 tablet 1  . zolpidem (AMBIEN) 10 MG tablet Take 5 mg by mouth at bedtime as needed for sleep.    [name] amiodarone (PACERONE) 200 MG tablet Take 1 tablet (200 mg total) by mouth daily. (Patient not taking: Reported on 04/16/2016) 30 tablet 3   No current  facility-administered medications for this encounter.     Allergies  Allergen Reactions  . Codeine Anaphylaxis    Jerking, involuntary jerking   . Beta Adrenergic Blockers     Makes her crazy Makes her crazy   . Metoprolol     depression depression  . Propoxyphene Other (See Comments)    Bad vivid dreams Bad vivid dreams  . Toprol Xl [Metoprolol Tartrate]     depression  . Dilantin [Phenytoin Sodium Extended] Rash and Itching    Social History   Social History  . Marital status: Married    Spouse name: N/A  . Number of children: N/A  . Years of education: N/A   Occupational History  . Not on file.   Social History Main Topics  . Smoking status: Never Smoker  . Smokeless tobacco: Never Used  . Alcohol use No     Comment: 11/12/2012 "no alcohol in the last 9 years"  . Drug use: No  . Sexual activity: Not Currently   Other Topics Concern  . Not on file   Social History Narrative   Pt lives in [place] with spouse.  Retired    Family History  Problem Relation Age of Onset  . Lung cancer Mother   . Hypertension Mother   . Hyperlipidemia Father   . Hypertension Father   . Heart disease Brother     ROS- All systems are  reviewed and negative except as per the HPI above  Physical Exam: Vitals:   04/16/16 0852  BP: 126/78  Pulse: 69  Weight: 236 lb 12.8 oz (107.4 kg)  Height: 5\' 7"  (1.702 m)   Wt Readings from Last 3 Encounters:  04/16/16 236 lb 12.8 oz (107.4 kg)  04/03/16 233 lb 6.4 oz (105.9 kg)  03/19/16 240 lb 12.8 oz (109.2 kg)    Labs: Lab Results  Component Value Date   NA 140 12/28/2015   K 4.2 12/28/2015   CL 100 12/28/2015   CO2 28 12/28/2015   GLUCOSE 88 12/28/2015   BUN 16 12/28/2015   CREATININE 0.82 12/28/2015   CALCIUM 9.4 12/28/2015   PHOS 4.4 11/12/2012   MG 2.3 06/08/2015   Lab Results  Component Value Date   INR 3.5 04/11/2016   No results found for: CHOL, HDL, LDLCALC, TRIG   GEN- The patient is well appearing, alert and oriented x 3 today.   Head- normocephalic, atraumatic Eyes-  Sclera clear, conjunctiva pink Ears- hearing intact Oropharynx- clear Neck- supple, no JVP Lymph- no cervical lymphadenopathy Lungs- Clear to ausculation bilaterally, normal work of breathing Heart- Regular rate and rhythm, no murmurs, rubs or gallops, PMI not laterally displaced GI- soft, NT, ND, + BS Extremities- no clubbing, cyanosis, or edema MS- no significant deformity or atrophy Skin- no rash or lesion Psych- euthymic mood, full affect Neuro- strength and sensation are intact  EKG- SR with first degree AV block pr int 230 ms, qrs int 96 ms, qtc 475 ms Epic records reviewed     Assessment and Plan: 1. Persistent afib with failure of tikosyn, 2 previous ablations at [facility] Improved on amiodarone and has been in SR without afib for almost two weeks Will reduce dose  of amiodarone to 200 mg once a day, pt aware Continue warfarin   2. Lifestyle issues contributing to afib Weight loss encouraged  Interested in attending weight management class this Friday Encouraged to be as active as possible Continue cpap  3. Abnormal PFT's Dr. Melvyn Novas evaluated and  thought  lung status better than PFT's showed and OK to continue amiodarone  He will follow pt on amiodarone  F/u here in 3 months and will get TSH/liver panel then   Butch Penny C. Payten Beaumier, [facility] [address] [place], [place] [address] [phone]

## 2016-04-24 ENCOUNTER — Ambulatory Visit (INDEPENDENT_AMBULATORY_CARE_PROVIDER_SITE_OTHER): Payer: Medicare Other | Admitting: *Deleted

## 2016-04-24 DIAGNOSIS — Z5181 Encounter for therapeutic drug level monitoring: Secondary | ICD-10-CM

## 2016-04-24 DIAGNOSIS — I4891 Unspecified atrial fibrillation: Secondary | ICD-10-CM | POA: Diagnosis not present

## 2016-04-24 DIAGNOSIS — I359 Nonrheumatic aortic valve disorder, unspecified: Secondary | ICD-10-CM

## 2016-04-24 LAB — POCT INR: INR: 2.6

## 2016-05-08 ENCOUNTER — Ambulatory Visit (INDEPENDENT_AMBULATORY_CARE_PROVIDER_SITE_OTHER): Payer: Medicare Other | Admitting: *Deleted

## 2016-05-08 DIAGNOSIS — I359 Nonrheumatic aortic valve disorder, unspecified: Secondary | ICD-10-CM

## 2016-05-08 DIAGNOSIS — I4891 Unspecified atrial fibrillation: Secondary | ICD-10-CM | POA: Diagnosis not present

## 2016-05-08 DIAGNOSIS — Z5181 Encounter for therapeutic drug level monitoring: Secondary | ICD-10-CM | POA: Diagnosis not present

## 2016-05-08 LAB — POCT INR: INR: 2.8

## 2016-05-14 ENCOUNTER — Other Ambulatory Visit: Payer: Self-pay | Admitting: Cardiology

## 2016-05-22 ENCOUNTER — Ambulatory Visit (INDEPENDENT_AMBULATORY_CARE_PROVIDER_SITE_OTHER): Payer: Medicare Other | Admitting: *Deleted

## 2016-05-22 DIAGNOSIS — I359 Nonrheumatic aortic valve disorder, unspecified: Secondary | ICD-10-CM

## 2016-05-22 DIAGNOSIS — Z5181 Encounter for therapeutic drug level monitoring: Secondary | ICD-10-CM

## 2016-05-22 DIAGNOSIS — I4891 Unspecified atrial fibrillation: Secondary | ICD-10-CM

## 2016-05-22 LAB — POCT INR: INR: 2.1

## 2016-06-01 ENCOUNTER — Encounter: Payer: Self-pay | Admitting: Internal Medicine

## 2016-06-01 ENCOUNTER — Ambulatory Visit (INDEPENDENT_AMBULATORY_CARE_PROVIDER_SITE_OTHER): Payer: Medicare Other | Admitting: Internal Medicine

## 2016-06-01 VITALS — BP 130/82 | HR 68 | Ht 67.0 in | Wt 235.0 lb

## 2016-06-01 DIAGNOSIS — R0609 Other forms of dyspnea: Secondary | ICD-10-CM

## 2016-06-01 DIAGNOSIS — J45991 Cough variant asthma: Secondary | ICD-10-CM

## 2016-06-01 LAB — PULMONARY FUNCTION TEST
DL/VA % PRED: 87 %
DL/VA % PRED: 94 %
DL/VA: 4.49 ml/min/mmHg/L
DL/VA: 4.85 ml/min/mmHg/L
DLCO COR % PRED: 56 %
DLCO COR: 15.93 ml/min/mmHg
DLCO COR: 18.73 ml/min/mmHg
DLCO UNC % PRED: 57 %
DLCO UNC % PRED: 65 %
DLCO cor % pred: 66 %
DLCO unc: 16.4 ml/min/mmHg
DLCO unc: 18.56 ml/min/mmHg
FEF 25-75 POST: 1.66 L/s
FEF 25-75 PRE: 0.73 L/s
FEF 25-75 Post: 1.32 L/sec
FEF 25-75 Pre: 0.83 L/sec
FEF2575-%CHANGE-POST: 126 %
FEF2575-%CHANGE-POST: 59 %
FEF2575-%PRED-POST: 81 %
FEF2575-%PRED-POST: 64 %
FEF2575-%PRED-PRE: 35 %
FEF2575-%PRED-PRE: 40 %
FEV1-%Change-Post: 22 %
FEV1-%Change-Post: 14 %
FEV1-%PRED-PRE: 50 %
FEV1-%Pred-Post: 61 %
FEV1-%Pred-Post: 58 %
FEV1-%Pred-Pre: 51 %
FEV1-Post: 1.57 L
FEV1-Post: 1.5 L
FEV1-Pre: 1.28 L
FEV1-Pre: 1.31 L
FEV1FVC-%CHANGE-POST: 24 %
FEV1FVC-%CHANGE-POST: 14 %
FEV1FVC-%PRED-PRE: 89 %
FEV1FVC-%Pred-Pre: 91 %
FEV6-%Change-Post: 0 %
FEV6-%Change-Post: 0 %
FEV6-%PRED-PRE: 58 %
FEV6-%Pred-Post: 58 %
FEV6-%Pred-Post: 58 %
FEV6-%Pred-Pre: 58 %
FEV6-PRE: 1.88 L
FEV6-Post: 1.86 L
FEV6-Post: 1.88 L
FEV6-Pre: 1.86 L
FEV6FVC-%Change-Post: 1 %
FEV6FVC-%Change-Post: 0 %
FEV6FVC-%PRED-PRE: 104 %
FEV6FVC-%Pred-Post: 104 %
FEV6FVC-%Pred-Post: 104 %
FEV6FVC-%Pred-Pre: 102 %
FVC-%Change-Post: -1 %
FVC-%Change-Post: 0 %
FVC-%PRED-POST: 56 %
FVC-%PRED-PRE: 56 %
FVC-%Pred-Post: 55 %
FVC-%Pred-Pre: 56 %
FVC-POST: 1.88 L
FVC-PRE: 1.88 L
FVC-Post: 1.86 L
FVC-Pre: 1.88 L
POST FEV1/FVC RATIO: 84 %
POST FEV1/FVC RATIO: 80 %
POST FEV6/FVC RATIO: 100 %
PRE FEV1/FVC RATIO: 68 %
Post FEV6/FVC ratio: 100 %
Pre FEV1/FVC ratio: 70 %
Pre FEV6/FVC Ratio: 99 %
Pre FEV6/FVC Ratio: 100 %
RV % PRED: 74 %
RV % pred: 91 %
RV: 2.16 L
RV: 1.75 L
TLC % pred: 79 %
TLC % pred: 72 %
TLC: 4.35 L
TLC: 3.97 L

## 2016-06-01 NOTE — Progress Notes (Signed)
Subjective:     Patient ID: [name], female   DOB: 1946/01/24,   MRN: [id number]    Brief patient profile:  36 yowf never smoker s/p AVR in 2006 then s/p LKR July 2016 and since then in and out of Afib assoc with sob / fatigue can last a few secs - days then feel fine in between referred to pulmonary clinic 04/03/2016 by Dr   [name] with abn pfts with h/o amiodarone exposure first in 2014 and restarted 03/19/16     History of Present Illness  04/03/2016 1st [facility] office visit/ [name]   Chief Complaint  Patient presents with  . Pulmonary Consult    Dr. Rayann [name] sent pt here, PFT showed advanced lung disease, pt states is fatigued and SOB  on best best days can walk the neighborhood x 7-8 min includes some hills for the first time as [place] lived [place] neighborhood and ever since new walk has noted more sob, but only on the hills Sleeps with cpap feeling fine/ [name] Each am wakes up with cough productive mucus is yellowy green x "a while" =  months / Not years assoc with nasal congestion but no wheezing  rec Augmentin 875 mg take one pill twice daily  X 10 days - take at breakfast and supper with large glass of water.  It would help reduce the usual side effects (diarrhea and yeast infections) if you ate cultured yogurt at lunch.  Need to let the coumadin clinic know you are on this medication Ocean nasal spray as much as you want     06/01/2016  f/u ov/[name] re: still on amiodarone Chief Complaint  Patient presents with  . Follow-up    PFT's done today. Breathing is "fine" has good days and bad days. She c/o nasal congestion recently.   can walk 10 min around neighborhood x some steep hills slow her down but never has to stop  No more bad days since first of the year / feels like afib better and that's what is making the difference  maint concern is variable nasal congestion and sense of pnds x months, worse at hs and in am assoc with cough Has tried  otcs no better   No obvious day to day or daytime variability or assoc excess/ purulent sputum or mucus plugs     or cp or chest tightness, subjective wheeze or overt  hb symptoms. No unusual exp hx or h/o childhood pna/ asthma or knowledge of premature birth.  Sleeping ok without nocturnal  or early am exacerbation  of respiratory  c/o's or need for noct saba. Also denies any obvious fluctuation of symptoms with weather or environmental changes or other aggravating or alleviating factors except as outlined above   Current Medications, Allergies, Complete Past Medical History, Past Surgical History, Family History, and Social History were reviewed in [employer] record.  ROS  The following are not active complaints unless bolded sore throat, dysphagia, dental problems, itching, sneezing,  nasal congestion or excess/ purulent secretions, ear ache,   fever, chills, sweats, unintended wt loss, classically pleuritic or exertional cp,  orthopnea pnd or leg swelling, presyncope, palpitations, abdominal pain, anorexia, nausea, vomiting, diarrhea  or change in bowel or bladder habits, change in stools or urine, dysuria,hematuria,  rash, arthralgias, visual complaints, headache, numbness, weakness or ataxia or problems with walking or coordination,  change in mood/affect or memory.  Objective:   Physical Exam    amb wf nad   06/01/2016       235   04/03/16 233 lb 6.4 oz (105.9 kg)  03/19/16 240 lb 12.8 oz (109.2 kg)  12/28/15 229 lb 6.4 oz (104.1 kg)    Vital signs reviewed  - Note on arrival 02 sats  96% on RA     HEENT: nl dentition,  and oropharynx. Nl external ear canals without cough reflex -  Severe   bilateral non-specific turbinate edema      NECK :  without JVD/Nodes/TM/ nl carotid upstrokes bilaterally   LUNGS: no acc muscle use,  Nl contour chest which is clear to A and P bilaterally without cough on insp or exp maneuvers   CV:  RRR mechanical  S1  no s3 or murmur or increase in P2, nad no edema   ABD:  soft and nontender with nl inspiratory excursion in the supine position. No bruits or organomegaly appreciated, bowel sounds nl  MS:  Nl gait/ ext warm without deformities, calf tenderness, cyanosis or clubbing No obvious joint restrictions   SKIN: warm and dry without lesions    NEURO:  alert, approp, nl sensorium with  no motor or cerebellar deficits apparent.     CXR PA and Lateral:   04/03/2016 :    I personally reviewed images and agree with radiology impression as follows:   Enlargement of cardiac silhouette post AVR. Bronchitic changes with minimal bibasilar atelectasis. Aortic atherosclerosis.      Lab Results  Component Value Date   ESRSEDRATE 28 04/03/2016      Assessment:

## 2016-06-01 NOTE — Patient Instructions (Addendum)
Please see patient coordinator before you leave today  to schedule sinus CT and I will call you with recommendations   Please schedule a follow up visit in 3 months but call sooner if needed

## 2016-06-03 NOTE — Assessment & Plan Note (Addendum)
PFT's  03/21/2016  FEV1 1.50 (58 % ) ratio 80  p 14 % improvement from saba p nothing prior to study with DLCO  65/66c % corrects to 94  % for alv volume   - FENO 04/03/2016  =   7  Allergy profile 04/03/2016 >  Eos 0.1 /  IgE  17 neg RAST - PFT's  06/01/2016  FEV1 1.57 (61 % ) ratio 84  p 22 % improvement from saba p nothing prior to study with DLCO  57/56 % corrects to 87  % for alv volume  - Sinus CT 06/01/2016 >>>   No evidence at all of asthma clinically at at this point despite the 22% response to SABA suggesting  Her symptoms are  more likely Upper airway cough syndrome (previously labeled PNDS) ,  Related to rhinitis/ sinusitis with perhaps a sinobronchial reflex causing the reversible airflow obstruction seen on pfts today   Next step is sinus CT then perhaps ent vs allergy eval depending on findings  Discussed in detail all the  indications, usual  risks and alternatives  relative to the benefits with patient who agrees to proceed with w/u as outlined

## 2016-06-03 NOTE — Assessment & Plan Note (Signed)
Body mass index is 36.81 trending up still  Lab Results  Component Value Date   TSH 2.48 01/20/2013     Contributing to gerd risk/ doe/reviewed the need and the process to achieve and maintain neg calorie balance > defer f/u primary care including intermittently monitoring thyroid status

## 2016-06-03 NOTE — Assessment & Plan Note (Signed)
Amiodarone restarted 03/19/16   Improving on amiodarone probably related to maintaining nsr with no decline in dlco x 3 m so reasonable to continue   I had an extended discussion with the patient reviewing all relevant studies completed to date and  lasting 15 to 20 minutes of a 25 minute visit    Each maintenance medication was reviewed in detail including most importantly the difference between maintenance and prns and under what circumstances the prns are to be triggered using an action plan format that is not reflected in the computer generated alphabetically organized AVS.    Please see AVS for specific instructions unique to this visit that I personally wrote and verbalized to the the pt in detail and then reviewed with pt  by my nurse highlighting any  changes in therapy recommended at today's visit to their plan of care.

## 2016-06-08 ENCOUNTER — Ambulatory Visit
Admission: RE | Admit: 2016-06-08 | Discharge: 2016-06-08 | Disposition: A | Discharge status: Home / Self Care | Payer: Medicare Other | Source: Ambulatory Visit | Attending: Internal Medicine | Admitting: Internal Medicine

## 2016-06-08 DIAGNOSIS — J45991 Cough variant asthma: Secondary | ICD-10-CM

## 2016-06-08 DIAGNOSIS — R05 Cough: Secondary | ICD-10-CM | POA: Diagnosis not present

## 2016-06-08 NOTE — Progress Notes (Signed)
Spoke with pt and notified of results per Dr. Wert. Pt verbalized understanding and denied any questions. 

## 2016-06-11 ENCOUNTER — Ambulatory Visit (INDEPENDENT_AMBULATORY_CARE_PROVIDER_SITE_OTHER): Payer: Medicare Other | Admitting: *Deleted

## 2016-06-11 ENCOUNTER — Ambulatory Visit (HOSPITAL_COMMUNITY): Payer: Medicare Other | Attending: Cardiovascular Disease

## 2016-06-11 ENCOUNTER — Other Ambulatory Visit: Payer: Self-pay

## 2016-06-11 DIAGNOSIS — Z952 Presence of prosthetic heart valve: Secondary | ICD-10-CM | POA: Diagnosis not present

## 2016-06-11 DIAGNOSIS — E669 Obesity, unspecified: Secondary | ICD-10-CM | POA: Diagnosis not present

## 2016-06-11 DIAGNOSIS — Z5181 Encounter for therapeutic drug level monitoring: Secondary | ICD-10-CM | POA: Diagnosis not present

## 2016-06-11 DIAGNOSIS — I5032 Chronic diastolic (congestive) heart failure: Secondary | ICD-10-CM | POA: Diagnosis not present

## 2016-06-11 DIAGNOSIS — I4891 Unspecified atrial fibrillation: Secondary | ICD-10-CM

## 2016-06-11 DIAGNOSIS — I350 Nonrheumatic aortic (valve) stenosis: Secondary | ICD-10-CM | POA: Diagnosis not present

## 2016-06-11 DIAGNOSIS — I359 Nonrheumatic aortic valve disorder, unspecified: Secondary | ICD-10-CM

## 2016-06-11 DIAGNOSIS — E785 Hyperlipidemia, unspecified: Secondary | ICD-10-CM | POA: Diagnosis not present

## 2016-06-11 DIAGNOSIS — I10 Essential (primary) hypertension: Secondary | ICD-10-CM | POA: Diagnosis not present

## 2016-06-11 DIAGNOSIS — I081 Rheumatic disorders of both mitral and tricuspid valves: Secondary | ICD-10-CM | POA: Diagnosis not present

## 2016-06-11 DIAGNOSIS — Z6836 Body mass index (BMI) 36.0-36.9, adult: Secondary | ICD-10-CM | POA: Insufficient documentation

## 2016-06-11 LAB — POCT INR: INR: 2.4

## 2016-06-12 ENCOUNTER — Telehealth: Payer: Self-pay

## 2016-06-12 DIAGNOSIS — I340 Nonrheumatic mitral (valve) insufficiency: Secondary | ICD-10-CM

## 2016-06-12 DIAGNOSIS — I77810 Thoracic aortic ectasia: Secondary | ICD-10-CM

## 2016-06-12 NOTE — Telephone Encounter (Signed)
Informed patient of results and verbal understanding expressed.  Repeat ECHO ordered to be scheduled in 1 year. Patient agrees with treatment plan. 

## 2016-06-12 NOTE — Telephone Encounter (Signed)
-----   Message from [name], MD sent at 06/11/2016  8:31 PM EST ----- Echo showed mildly thickened heart muscle and mildly enlarged LV with overall normal LVF.  Stable AVR, moderately dilated ascending aorta, mild to moderate MR, severe LAE - aortic aneurysm is stable - repeat echo in 1 year for dilated aorta and MR

## 2016-06-25 ENCOUNTER — Encounter: Payer: Self-pay | Admitting: Cardiology

## 2016-06-25 ENCOUNTER — Ambulatory Visit (INDEPENDENT_AMBULATORY_CARE_PROVIDER_SITE_OTHER): Payer: Medicare Other | Admitting: Cardiology

## 2016-06-25 VITALS — BP 138/82 | HR 74 | Ht 67.0 in | Wt 240.8 lb

## 2016-06-25 DIAGNOSIS — I77810 Thoracic aortic ectasia: Secondary | ICD-10-CM | POA: Diagnosis not present

## 2016-06-25 DIAGNOSIS — I481 Persistent atrial fibrillation: Secondary | ICD-10-CM | POA: Diagnosis not present

## 2016-06-25 DIAGNOSIS — G4733 Obstructive sleep apnea (adult) (pediatric): Secondary | ICD-10-CM | POA: Diagnosis not present

## 2016-06-25 DIAGNOSIS — I5032 Chronic diastolic (congestive) heart failure: Secondary | ICD-10-CM

## 2016-06-25 DIAGNOSIS — I10 Essential (primary) hypertension: Secondary | ICD-10-CM | POA: Diagnosis not present

## 2016-06-25 DIAGNOSIS — I350 Nonrheumatic aortic (valve) stenosis: Secondary | ICD-10-CM | POA: Diagnosis not present

## 2016-06-25 DIAGNOSIS — I4819 Other persistent atrial fibrillation: Secondary | ICD-10-CM

## 2016-06-25 DIAGNOSIS — I340 Nonrheumatic mitral (valve) insufficiency: Secondary | ICD-10-CM

## 2016-06-25 HISTORY — DX: Thoracic aortic ectasia: I77.810

## 2016-06-25 HISTORY — DX: Nonrheumatic mitral (valve) insufficiency: I34.0

## 2016-06-25 LAB — CBC WITH DIFFERENTIAL/PLATELET
BASOS ABS: 0 10*3/uL (ref 0.0–0.2)
Basos: 1 %
EOS (ABSOLUTE): 0.2 10*3/uL (ref 0.0–0.4)
Eos: 4 %
Hematocrit: 38.7 % (ref 34.0–46.6)
Hemoglobin: 12.8 g/dL (ref 11.1–15.9)
IMMATURE GRANS (ABS): 0 10*3/uL (ref 0.0–0.1)
IMMATURE GRANULOCYTES: 0 %
LYMPHS: 24 %
Lymphocytes Absolute: 1.4 10*3/uL (ref 0.7–3.1)
MCH: 29.2 pg (ref 26.6–33.0)
MCHC: 33.1 g/dL (ref 31.5–35.7)
MCV: 88 fL (ref 79–97)
MONOS ABS: 0.8 10*3/uL (ref 0.1–0.9)
Monocytes: 14 %
NEUTROS ABS: 3.4 10*3/uL (ref 1.4–7.0)
NEUTROS PCT: 57 %
PLATELETS: 286 10*3/uL (ref 150–379)
RBC: 4.38 x10E6/uL (ref 3.77–5.28)
RDW: 14.2 % (ref 12.3–15.4)
WBC: 5.9 10*3/uL (ref 3.4–10.8)

## 2016-06-25 LAB — BASIC METABOLIC PANEL
BUN/Creatinine Ratio: 20 (ref 12–28)
BUN: 16 mg/dL (ref 8–27)
CALCIUM: 9.3 mg/dL (ref 8.7–10.3)
CHLORIDE: 98 mmol/L (ref 96–106)
CO2: 27 mmol/L (ref 18–29)
Creatinine, Ser: 0.8 mg/dL (ref 0.57–1.00)
GFR calc Af Amer: 86 mL/min/{1.73_m2} (ref >59)
GFR calc non Af Amer: 74 mL/min/{1.73_m2} (ref >59)
Glucose: 87 mg/dL (ref 65–99)
POTASSIUM: 3.9 mmol/L (ref 3.5–5.2)
SODIUM: 141 mmol/L (ref 134–144)

## 2016-06-25 LAB — HEPATIC FUNCTION PANEL
ALT: 20 IU/L (ref 0–32)
AST: 20 IU/L (ref 0–40)
Albumin: 4.5 g/dL (ref 3.5–4.8)
Alkaline Phosphatase: 85 IU/L (ref 39–117)
BILIRUBIN TOTAL: 0.4 mg/dL (ref 0.0–1.2)
Bilirubin, Direct: 0.11 mg/dL (ref 0.00–0.40)
TOTAL PROTEIN: 6.9 g/dL (ref 6.0–8.5)

## 2016-06-25 LAB — LIPID PANEL
CHOLESTEROL TOTAL: 201 mg/dL — AB (ref 100–199)
Chol/HDL Ratio: 2.8 ratio units (ref 0.0–4.4)
HDL: 72 mg/dL (ref >39)
LDL Calculated: 110 mg/dL — ABNORMAL HIGH (ref 0–99)
Triglycerides: 95 mg/dL (ref 0–149)
VLDL CHOLESTEROL CAL: 19 mg/dL (ref 5–40)

## 2016-06-25 NOTE — Patient Instructions (Signed)
Medication Instructions:  Your physician recommends that you continue on your current medications as directed. Please refer to the Current Medication list given to you today.   Labwork: TODAY: BMET, CBC, LFTs, Lipids  Testing/Procedures: None  Follow-Up: Your physician wants you to follow-up in: 6 months with Dr. Turner. You will receive a reminder letter in the mail two months in advance. If you don't receive a letter, please call our office to schedule the follow-up appointment.   Any Other Special Instructions Will Be Listed Below (If Applicable).     If you need a refill on your cardiac medications before your next appointment, please call your pharmacy.   

## 2016-06-25 NOTE — Progress Notes (Signed)
Cardiology Office Note    Date:  06/25/2016   ID:  [name], DOB Jan 20, 1946, MRN [id number]  PCP:  Jonathon Bellows, MD  Cardiologist:  Fransico Him, MD   Chief Complaint  Patient presents with  . Aortic Stenosis  . Atrial Fibrillation  . Congestive Heart Failure  . Sleep Apnea  . Hypertension    History of Present Illness:  [name] is a 71 y.o. female with a history of afib s/p failed Tikosyn load and recurrent DCCV which failed to maintain NSR. She was loaded on amio and underwent recurrent DCCV to NSR but this did not hold and she reverted back to afib. She underwent afib ablation at [facility] and is now on Tikosyn. She had recurrent afib and underwent repeat afib ablation 10/2015 at [facility] and is now on Amio.  She has been maintaining NSR since then.  She also has a history of OSA on CPAP at 12cm H2O, AS s/p mechanical AVR and chronic anticoagulation.She denies any anginal chest pain,dizziness or syncope.She has chronic DOE which is stable. Her LE edema has resolved.  She is doing well with her CPAP. She uses the nasal pillow mask with chin strap.She feels rested in the am and has no daytime sleepiness. She sleeps well at night and never wakes up at night.  She denies any significant problems with dry mouth but occasionally has some mild nasal congestion.     Past Medical History:  Diagnosis Date  . Anxiety   . Aortic stenosis    status post aortic valve replacement with St. Jude mechanical prosthesis  . 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   37mm by echo 06/2016 and 110mm by CT angin 2016  . Chronic anticoagulation   . 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 Depression   . DJD (degenerative joint disease) of knee   . Dyslipidemia   . Exertional shortness of breath   . GERD (gastroesophageal reflux disease)    "several years ago; none since" (11/12/2012)  . Hyperlipidemia   . Hypertension   . Left atrial enlargement    . Mitral regurgitation 06/25/2016   Mild moderate MR by echo 06/2016  . Obesity   . Persistent atrial fibrillation (HCC)    s/p afib ablation x 2 at [facility]  . Sleep apnea    On CPAP at 12cm H2O  . 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 Valvular heart disease     Past Surgical History:  Procedure Laterality Date  . BURR HOLE FOR SUBDURAL HEMATOMA  2006  . CARDIAC VALVE REPLACEMENT  2006   St. Jude AVR  . CARDIOVERSION N/A 07/22/2012   Procedure: CARDIOVERSION;  Surgeon: [name], MD;  Location: [facility] ENDOSCOPY;  Service: Cardiovascular;  Laterality: N/A;  . CARDIOVERSION N/A 11/25/2012   Procedure: CARDIOVERSION;  Surgeon: [name], MD;  Location: [facility] ENDOSCOPY;  Service: Cardiovascular;  Laterality: N/A;  . CARDIOVERSION N/A 12/30/2012   Procedure: CARDIOVERSION;  Surgeon: [name], MD;  Location: [facility] ENDOSCOPY;  Service: Cardiovascular;  Laterality: N/A;  h&p in file-HW   . CARDIOVERSION N/A 03/30/2013   Procedure: CARDIOVERSION;  Surgeon: [name], MD;  Location: [facility] ENDOSCOPY;  Service: Cardiovascular;  Laterality: N/A;  . ELECTROPHYSIOLOGIC STUDY  11/2013   Afib ablation x 2 (11/2013 and 10/2015) at [facility] by Dr  Mouncey with recurrence post ablation  . KNEE ARTHROSCOPY Left 1980's   "2" (11/12/2012)  . KNEE SURGERY Left 1980's   "after 2 scopes they went in and did some kind of OR" (11/12/2012)  . TEE WITHOUT CARDIOVERSION N/A 07/22/2012   Procedure: TRANSESOPHAGEAL ECHOCARDIOGRAM (TEE);  Surgeon: [name], MD;  Location: [facility] ENDOSCOPY;  Service: Cardiovascular;  Laterality: N/A;  Rm 2034  . TEE WITHOUT CARDIOVERSION N/A 10/07/2013   Procedure: TRANSESOPHAGEAL ECHOCARDIOGRAM (TEE);  Surgeon: [name], MD;  Location: [facility] ENDOSCOPY;  Service: Cardiovascular;  Laterality: N/A;  . TOTAL KNEE ARTHROPLASTY Left 11/05/2014   Procedure: TOTAL KNEE ARTHROPLASTY;  Surgeon: Dorna Leitz, MD;   Location: [place];  Service: Orthopedics;  Laterality: Left;  . TUBAL LIGATION  1980's    Current Medications: Current Meds  Medication Sig  . ALPRAZolam (XANAX XR) 2 MG 24 hr tablet Take 2 mg by mouth as needed. Reported on 06/08/2015  . amiodarone (PACERONE) 200 MG tablet Take 1 tablet (200 mg total) by mouth daily.  . Ascorbic Acid (VITAMIN C) 1000 MG tablet Take 2,000 mg by mouth daily.  . calcium-vitamin D (OSCAL WITH D) 500-200 MG-UNIT per tablet Take 2 tablets by mouth daily.   . Cholecalciferol (VITAMIN D) 2000 UNITS tablet Take 4,000 Units by mouth daily.  Marland Kitchen DILT-XR 240 MG 24 hr capsule TAKE 1 CAPSULE BY MOUTH EVERY MORNING ON AN EMPTY STOMACH  . furosemide (LASIX) 80 MG tablet TAKE 1 TABLET BY MOUTH EVERY DAY  . LORazepam (ATIVAN) 0.5 MG tablet Take 0.5 mg by mouth every 8 (eight) hours as needed for anxiety. Reported on 06/08/2015  . losartan (COZAAR) 100 MG tablet Take 100 mg by mouth daily.  . Multiple Vitamin (MULTIVITAMIN WITH MINERALS) TABS Take 1 tablet by mouth daily.  . potassium chloride SA (K-DUR,KLOR-CON) 20 MEQ tablet TAKE 2 TABLETS BY MOUTH EVERY MORNING AND 1 TABLET EVERY EVENING  . pravastatin (PRAVACHOL) 10 MG tablet Take 10 mg by mouth daily.  . sertraline (ZOLOFT) 100 MG tablet Take 150 mg by mouth at bedtime.  . tretinoin (RETIN-A) 0.05 % cream Apply 1 application topically at bedtime.   Marland Kitchen warfarin (COUMADIN) 4 MG tablet TAKE AS DIRECTED BY COUMADIN CLINIC  . zolpidem (AMBIEN) 10 MG tablet Take 5 mg by mouth at bedtime as needed for sleep.    Allergies:   Codeine; Beta adrenergic blockers; Metoprolol; Propoxyphene; Toprol xl [metoprolol tartrate]; and Dilantin [phenytoin sodium extended]   Social History   Social History  . Marital status: Married    Spouse name: N/A  . Number of children: N/A  . Years of education: N/A   Social History Main Topics  . Smoking status: Never Smoker  . Smokeless tobacco: Never Used  . Alcohol use No     Comment: 11/12/2012  "no alcohol in the last 9 years"  . Drug use: No  . Sexual activity: Not Currently   Other Topics Concern  . None   Social History Narrative   Pt lives in [place] with spouse.  Retired     Family History:  The patient's family history includes Heart disease in her brother; Hyperlipidemia in her father; Hypertension in her father and mother; Lung cancer in her mother.   ROS:   Please see the history of present illness.    ROS All other systems reviewed and are negative.  No flowsheet data found.     PHYSICAL EXAM:   VS:  BP 138/82   Pulse 74  Ht 5\' 7"  (1.702 m)   Wt 240 lb 12.8 oz (109.2 kg)   SpO2 97%   BMI 37.71 kg/m    GEN: Well nourished, well developed, in no acute distress  HEENT: normal  Neck: no JVD, carotid bruits, or masses Cardiac: RRR; no murmurs, rubs, or gallops,no edema.  Intact distal pulses bilaterally. Crisp mechanical heart sounds. Respiratory:  clear to auscultation bilaterally, normal work of breathing GI: soft, nontender, nondistended, + BS MS: no deformity or atrophy  Skin: warm and dry, no rash Neuro:  Alert and Oriented x 3, Strength and sensation are intact Psych: euthymic mood, full affect  Wt Readings from Last 3 Encounters:  06/25/16 240 lb 12.8 oz (109.2 kg)  06/01/16 235 lb (106.6 kg)  04/16/16 236 lb 12.8 oz (107.4 kg)      Studies/Labs Reviewed:   EKG:  EKG is not ordered today.    Recent Labs: 12/28/2015: BUN 16; Creat 0.82; Potassium 4.2; Sodium 140 04/03/2016: Hemoglobin 12.9; Platelets 261.0   Lipid Panel No results found for: CHOL, TRIG, HDL, CHOLHDL, VLDL, LDLCALC, LDLDIRECT  Additional studies/ records that were reviewed today include:  CPAP download    ASSESSMENT:    1. Aortic valve stenosis, etiology of cardiac valve disease unspecified   2. Persistent atrial fibrillation ([place])   3. Chronic diastolic CHF (congestive heart failure) ([place])   4. Essential hypertension   5. Obstructive sleep apnea   6.  Mitral valve insufficiency, unspecified etiology   7. Ascending aorta dilatation (HCC)      PLAN:  In order of problems listed above:  1.  AS s/p mechanical AVR. Echo showed stable AVR.  She will continue warfarin. She is not on ASA as she had a cerebral bleed 10 years ago in setting of warfarin but is now on warfarin after getting clearance from neurosurgery.  I am hesitant to start her on ASA in addition as her bleed was in the setting of an INR of 2.2.   2.  Persistent atrial fibrillation -she is  maintaining NSR.  She will continue Cardizem, Amio and warfarin.  3.  Chronic diastolic CHF - she appears euvolemic on exam.  Her weight is stable.2D echo 06/2016 showed normal LVF with mildly dilated LV.  She will continue Lasix and ARB.  4.  HTN - BP is controlled on current meds.  Continue Cardizem/ARB.  5.  OSA - the patient is tolerating PAP therapy well without any problems. The PAP download was reviewed today and showed an AHI of 1/hr on 12 cm H2O with 94% compliance in using more than 4 hours nightly.  The patient has been using and benefiting from CPAP use and will continue to benefit from therapy.  6.   Mild to moderate MR by echo 06/2016.   7.   Mild to moderately dilated aortic root/ascending aorta.  4.4cm by echo 06/2016.  Echo has been ordered for 1 year to assess for progression.  She will continue on statin and Bp is well controlled.   8.   Hyperlipidemia - LDL goal < 70.  I will check her FLP and ALT. Continue statin.       Medication Adjustments/Labs and Tests Ordered: Current medicines are reviewed at length with the patient today.  Concerns regarding medicines are outlined above.  Medication changes, Labs and Tests ordered today are listed in the Patient Instructions below.  There are no Patient Instructions on file for this visit.   Signed, Fransico Him, MD  06/25/2016  9:58 AM    [facility] [place], [place], [place]  [address] Phone:  [phone]; Fax: [phone]

## 2016-07-04 ENCOUNTER — Other Ambulatory Visit: Payer: Self-pay | Admitting: Cardiology

## 2016-07-10 ENCOUNTER — Ambulatory Visit (INDEPENDENT_AMBULATORY_CARE_PROVIDER_SITE_OTHER): Payer: Medicare Other | Admitting: *Deleted

## 2016-07-10 DIAGNOSIS — I4891 Unspecified atrial fibrillation: Secondary | ICD-10-CM | POA: Diagnosis not present

## 2016-07-10 DIAGNOSIS — I481 Persistent atrial fibrillation: Secondary | ICD-10-CM | POA: Diagnosis not present

## 2016-07-10 DIAGNOSIS — I359 Nonrheumatic aortic valve disorder, unspecified: Secondary | ICD-10-CM

## 2016-07-10 DIAGNOSIS — Z5181 Encounter for therapeutic drug level monitoring: Secondary | ICD-10-CM

## 2016-07-10 DIAGNOSIS — I4819 Other persistent atrial fibrillation: Secondary | ICD-10-CM

## 2016-07-10 LAB — POCT INR: INR: 3.6

## 2016-07-17 ENCOUNTER — Ambulatory Visit (HOSPITAL_COMMUNITY)
Admission: RE | Admit: 2016-07-17 | Discharge: 2016-07-17 | Disposition: A | Discharge status: Home / Self Care | Payer: Medicare Other | Source: Ambulatory Visit | Attending: Nurse Practitioner | Admitting: Nurse Practitioner

## 2016-07-17 ENCOUNTER — Encounter (HOSPITAL_COMMUNITY): Payer: Self-pay | Admitting: Nurse Practitioner

## 2016-07-17 VITALS — BP 150/84 | HR 65 | Ht 67.0 in | Wt 243.2 lb

## 2016-07-17 DIAGNOSIS — Z952 Presence of prosthetic heart valve: Secondary | ICD-10-CM | POA: Diagnosis not present

## 2016-07-17 DIAGNOSIS — I4891 Unspecified atrial fibrillation: Secondary | ICD-10-CM

## 2016-07-17 DIAGNOSIS — K219 Gastro-esophageal reflux disease without esophagitis: Secondary | ICD-10-CM | POA: Diagnosis not present

## 2016-07-17 DIAGNOSIS — F419 Anxiety disorder, unspecified: Secondary | ICD-10-CM | POA: Diagnosis not present

## 2016-07-17 DIAGNOSIS — E669 Obesity, unspecified: Secondary | ICD-10-CM | POA: Diagnosis not present

## 2016-07-17 DIAGNOSIS — I10 Essential (primary) hypertension: Secondary | ICD-10-CM | POA: Diagnosis not present

## 2016-07-17 DIAGNOSIS — I4581 Long QT syndrome: Secondary | ICD-10-CM | POA: Diagnosis not present

## 2016-07-17 DIAGNOSIS — Z9889 Other specified postprocedural states: Secondary | ICD-10-CM | POA: Insufficient documentation

## 2016-07-17 DIAGNOSIS — I252 Old myocardial infarction: Secondary | ICD-10-CM | POA: Insufficient documentation

## 2016-07-17 DIAGNOSIS — Z7901 Long term (current) use of anticoagulants: Secondary | ICD-10-CM | POA: Diagnosis not present

## 2016-07-17 DIAGNOSIS — G4733 Obstructive sleep apnea (adult) (pediatric): Secondary | ICD-10-CM | POA: Insufficient documentation

## 2016-07-17 DIAGNOSIS — F329 Major depressive disorder, single episode, unspecified: Secondary | ICD-10-CM | POA: Diagnosis not present

## 2016-07-17 DIAGNOSIS — I080 Rheumatic disorders of both mitral and aortic valves: Secondary | ICD-10-CM | POA: Diagnosis not present

## 2016-07-17 DIAGNOSIS — I481 Persistent atrial fibrillation: Secondary | ICD-10-CM | POA: Insufficient documentation

## 2016-07-17 DIAGNOSIS — Z96652 Presence of left artificial knee joint: Secondary | ICD-10-CM | POA: Diagnosis not present

## 2016-07-17 DIAGNOSIS — E785 Hyperlipidemia, unspecified: Secondary | ICD-10-CM | POA: Diagnosis not present

## 2016-07-17 LAB — TSH: TSH: 7.337 u[IU]/mL — AB (ref 0.350–4.500)

## 2016-07-17 LAB — T4, FREE: FREE T4: 0.83 ng/dL (ref 0.61–1.12)

## 2016-07-18 LAB — T3, FREE: T3, Free: 2.6 pg/mL (ref 2.0–4.4)

## 2016-07-18 NOTE — Progress Notes (Signed)
Primary Care Physician: Jonathon Bellows, MD Referring Physician:Dr. Allred Cardiologist: Dr. Daneil Dolin is a 71 y.o. female with a h/o obesity, OSA, s/p mechanical AVR, and persistent afib who presents for for follow-up in the afib clinic.Marland Kitchen  She reports initially being diagnosed with atrial fibrillation 4/14 after presenting to Dr [name] office with symptoms of fatigue and decreased exercise tolerance.  She was noted to be in afib with RVR at that time.  She failed medical therapy subsequently with tikosyn and amiodarone.  She was evaluated by Dr. Rayann Heman in 2015.  She was noted to have moderate MR and severe LA enlargement with LA size of 44mm.  Due to extensive atriopathy and valvular heart disease as well as obesity, I advised her that anticipated success with ablation was reduced.  I encouraged that she see Dr Roxy Manns for consideration of mitral repair and surgical MAZE at that time.  She declined and was referred to [facility] for a second opinion.  She states that she underwent afib ablation 11/2013 by Dr Clyda Hurdle.  She did well for about a year before having recurrence of afib after knee surgery.  She underwent repeat ablation by Dr Clyda Hurdle 7/17.  She has continued to have afib and atypical atrial flutter since that time despite medical therapy with tikosyn.  She was more recently seen at [facility] in the EP clinic and recommendations were made for amiodarone.  She called Dr Radford Pax for her thoughts and was referred back to Dr. Rayann Heman for additional discussions.  He did encourage her to start amiodarone after explanation of risk vrs benefit. She has been loading for almost one month and is aware when to reduce drug to once a day. She is in NSR today and has not felt any afib since 12/27. She is compliant with cpap. No tobacco, alcohol or excessive caffeine. She walks her dog daily when the weather is agreeable and struggles with her weight. Interested in the weight management class this Friday.  Continues with warfarin, no bleeding issues. Her baseline PFT's were abnormal and pt had evaluation with Dr. Melvyn Novas and thought to be OK to continue with amiodarone, possibly mild asthma.  F/u in afib clinic. Pt has only noted rare episode of irregular heart beat. She stats that has has felt tired  For the last 2 weeks. Health is unchanged other wise.  Today, she denies symptoms of palpitations, chest pain, shortness of breath, orthopnea, PND, lower extremity edema, dizziness, presyncope, syncope, or neurologic sequela. The patient is tolerating medications without difficulties and is otherwise without complaint today.   Past Medical History:  Diagnosis Date  . Anxiety   . Aortic stenosis    status post aortic valve replacement with St. Jude mechanical prosthesis  . 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   30mm by echo 06/2016 and 13mm by CT angin 2016  . Chronic anticoagulation   . 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 Depression   . DJD (degenerative joint disease) of knee   . Dyslipidemia   . Exertional shortness of breath   . GERD (gastroesophageal reflux disease)    "several years ago; none since" (11/12/2012)  . Hyperlipidemia   . Hypertension   . Left atrial enlargement   . Mitral regurgitation 06/25/2016   Mild moderate MR by echo 06/2016  . Obesity   . Persistent atrial fibrillation (HCC)    s/p afib ablation x 2 at [facility]  . Sleep apnea  On CPAP at 12cm H2O  . 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 Valvular heart disease    Past Surgical History:  Procedure Laterality Date  . BURR HOLE FOR SUBDURAL HEMATOMA  2006  . CARDIAC VALVE REPLACEMENT  2006   St. Jude AVR  . CARDIOVERSION N/A 07/22/2012   Procedure: CARDIOVERSION;  Surgeon: [name], MD;  Location: [facility] ENDOSCOPY;  Service: Cardiovascular;  Laterality: N/A;  . CARDIOVERSION N/A  11/25/2012   Procedure: CARDIOVERSION;  Surgeon: [name], MD;  Location: [facility] ENDOSCOPY;  Service: Cardiovascular;  Laterality: N/A;  . CARDIOVERSION N/A 12/30/2012   Procedure: CARDIOVERSION;  Surgeon: [name], MD;  Location: [facility] ENDOSCOPY;  Service: Cardiovascular;  Laterality: N/A;  h&p in file-HW   . CARDIOVERSION N/A 03/30/2013   Procedure: CARDIOVERSION;  Surgeon: [name], MD;  Location: [facility] ENDOSCOPY;  Service: Cardiovascular;  Laterality: N/A;  . ELECTROPHYSIOLOGIC STUDY  11/2013   Afib ablation x 2 (11/2013 and 10/2015) at [facility] by Dr Clyda Hurdle with recurrence post ablation  . KNEE ARTHROSCOPY Left 1980's   "2" (11/12/2012)  . KNEE SURGERY Left 1980's   "after 2 scopes they went in and did some kind of OR" (11/12/2012)  . TEE WITHOUT CARDIOVERSION N/A 07/22/2012   Procedure: TRANSESOPHAGEAL ECHOCARDIOGRAM (TEE);  Surgeon: [name], MD;  Location: [facility] ENDOSCOPY;  Service: Cardiovascular;  Laterality: N/A;  Rm 2034  . TEE WITHOUT CARDIOVERSION N/A 10/07/2013   Procedure: TRANSESOPHAGEAL ECHOCARDIOGRAM (TEE);  Surgeon: [name], MD;  Location: [facility] ENDOSCOPY;  Service: Cardiovascular;  Laterality: N/A;  . TOTAL KNEE ARTHROPLASTY Left 11/05/2014   Procedure: TOTAL KNEE ARTHROPLASTY;  Surgeon: Dorna Leitz, MD;  Location: [place];  Service: Orthopedics;  Laterality: Left;  . TUBAL LIGATION  1980's    Current Outpatient Prescriptions  Medication Sig Dispense Refill  . ALPRAZolam (XANAX XR) 2 MG 24 hr tablet Take 2 mg by mouth as needed. Reported on 06/08/2015    . amiodarone (PACERONE) 200 MG tablet Take 1 tablet (200 mg total) by mouth daily. 30 tablet 3  . Ascorbic Acid (VITAMIN C) 1000 MG tablet Take 2,000 mg by mouth daily.    . calcium-vitamin D (OSCAL WITH D) 500-200 MG-UNIT per tablet Take 2 tablets by mouth daily.     . Cholecalciferol (VITAMIN D) 2000 UNITS tablet Take 4,000 Units by mouth daily.    Marland Kitchen DILT-XR 240 MG 24 hr capsule TAKE 1 CAPSULE BY MOUTH EVERY MORNING ON AN  EMPTY STOMACH 90 capsule 2  . furosemide (LASIX) 80 MG tablet TAKE 1 TABLET BY MOUTH EVERY DAY 90 tablet 3  . LORazepam (ATIVAN) 0.5 MG tablet Take 0.5 mg by mouth every 8 (eight) hours as needed for anxiety. Reported on 06/08/2015    . losartan (COZAAR) 100 MG tablet Take 100 mg by mouth daily.    . Multiple Vitamin (MULTIVITAMIN WITH MINERALS) TABS Take 1 tablet by mouth daily.    . potassium chloride SA (K-DUR,KLOR-CON) 20 MEQ tablet TAKE 2 TABLETS BY MOUTH EVERY MORNING AND 1 TABLET EVERY EVENING 270 tablet 3  . pravastatin (PRAVACHOL) 10 MG tablet Take 10 mg by mouth daily.    . sertraline (ZOLOFT) 100 MG tablet Take 200 mg by mouth at bedtime.     . tretinoin (RETIN-A) 0.05 % cream Apply 1 application topically at bedtime.     Marland Kitchen warfarin (COUMADIN) 4 MG  tablet TAKE AS DIRECTED BY COUMADIN CLINIC 135 tablet 1  . zolpidem (AMBIEN) 10 MG tablet Take 5 mg by mouth at bedtime as needed for sleep.     No current facility-administered medications for this encounter.     Allergies  Allergen Reactions  . Codeine Anaphylaxis    Jerking, involuntary jerking   . Beta Adrenergic Blockers     Makes her crazy Makes her crazy   . Metoprolol     depression depression  . Propoxyphene Other (See Comments)    Bad vivid dreams Bad vivid dreams  . Toprol Xl [Metoprolol Tartrate]     depression  . Dilantin [Phenytoin Sodium Extended] Rash and Itching    Social History   Social History  . Marital status: Married    Spouse name: N/A  . Number of children: N/A  . Years of education: N/A   Occupational History  . Not on file.   Social History Main Topics  . Smoking status: Never Smoker  . Smokeless tobacco: Never Used  . Alcohol use No     Comment: 11/12/2012 "no alcohol in the last 9 years"  . Drug use: No  . Sexual activity: Not Currently   Other Topics Concern  . Not on file   Social History Narrative   Pt lives in [place] with spouse.  Retired    Family History  Problem  Relation Age of Onset  . Lung cancer Mother   . Hypertension Mother   . Hyperlipidemia Father   . Hypertension Father   . Heart disease Brother     ROS- All systems are reviewed and negative except as per the HPI above  Physical Exam: Vitals:   07/17/16 1018  BP: (!) 150/84  Pulse: 65  Weight: 243 lb 3.2 oz (110.3 kg)  Height: 5\' 7"  (1.702 m)   Wt Readings from Last 3 Encounters:  07/17/16 243 lb 3.2 oz (110.3 kg)  06/25/16 240 lb 12.8 oz (109.2 kg)  06/01/16 235 lb (106.6 kg)    Labs: Lab Results  Component Value Date   NA 141 06/25/2016   K 3.9 06/25/2016   CL 98 06/25/2016   CO2 27 06/25/2016   GLUCOSE 87 06/25/2016   BUN 16 06/25/2016   CREATININE 0.80 06/25/2016   CALCIUM 9.3 06/25/2016   PHOS 4.4 11/12/2012   MG 2.3 06/08/2015   Lab Results  Component Value Date   INR 3.6 07/10/2016   Lab Results  Component Value Date   CHOL 201 (H) 06/25/2016   HDL 72 06/25/2016   LDLCALC 110 (H) 06/25/2016   TRIG 95 06/25/2016     GEN- The patient is well appearing, alert and oriented x 3 today.   Head- normocephalic, atraumatic Eyes-  Sclera clear, conjunctiva pink Ears- hearing intact Oropharynx- clear Neck- supple, no JVP Lymph- no cervical lymphadenopathy Lungs- Clear to ausculation bilaterally, normal work of breathing Heart- Regular rate and rhythm, no murmurs, rubs or gallops, PMI not laterally displaced GI- soft, NT, ND, + BS Extremities- no clubbing, cyanosis, or edema MS- no significant deformity or atrophy Skin- no rash or lesion Psych- euthymic mood, full affect Neuro- strength and sensation are intact  EKG- SR with first degree AV block pr int 224 ms, qrs int 96 ms, qtc 474 ms Epic records reviewed     Assessment and Plan: 1. Persistent afib with failure of tikosyn, 2 previous ablations at [facility] on amioderone Continue at 200 mg a day Continue warfarin  TSH, cmet today  2. Lifestyle issues contributing to  afib Weight loss encouraged   Encouraged to be as active as possible Continue cpap  3. Abnormal PFT's Dr. Melvyn Novas evaluated and thought lung status better than PFT's showed and OK to continue amiodarone  He will follow pt on amiodarone  F/u in 6 months   Butch Penny C. Charis Juliana, [facility] [address] [place], [place] [address] [phone]

## 2016-07-23 ENCOUNTER — Other Ambulatory Visit: Payer: Self-pay | Admitting: Family Medicine

## 2016-07-23 DIAGNOSIS — Z1231 Encounter for screening mammogram for malignant neoplasm of breast: Secondary | ICD-10-CM

## 2016-07-24 ENCOUNTER — Ambulatory Visit (INDEPENDENT_AMBULATORY_CARE_PROVIDER_SITE_OTHER): Payer: Medicare Other | Admitting: *Deleted

## 2016-07-24 DIAGNOSIS — Z5181 Encounter for therapeutic drug level monitoring: Secondary | ICD-10-CM

## 2016-07-24 DIAGNOSIS — I481 Persistent atrial fibrillation: Secondary | ICD-10-CM | POA: Diagnosis not present

## 2016-07-24 DIAGNOSIS — I4819 Other persistent atrial fibrillation: Secondary | ICD-10-CM

## 2016-07-24 DIAGNOSIS — I4891 Unspecified atrial fibrillation: Secondary | ICD-10-CM

## 2016-07-24 DIAGNOSIS — I359 Nonrheumatic aortic valve disorder, unspecified: Secondary | ICD-10-CM

## 2016-07-24 LAB — POCT INR: INR: 2.7

## 2016-08-07 ENCOUNTER — Ambulatory Visit (INDEPENDENT_AMBULATORY_CARE_PROVIDER_SITE_OTHER): Payer: Medicare Other | Admitting: Pharmacist

## 2016-08-07 DIAGNOSIS — Z5181 Encounter for therapeutic drug level monitoring: Secondary | ICD-10-CM

## 2016-08-07 DIAGNOSIS — I4891 Unspecified atrial fibrillation: Secondary | ICD-10-CM

## 2016-08-07 DIAGNOSIS — I359 Nonrheumatic aortic valve disorder, unspecified: Secondary | ICD-10-CM

## 2016-08-07 DIAGNOSIS — I481 Persistent atrial fibrillation: Secondary | ICD-10-CM | POA: Diagnosis not present

## 2016-08-07 DIAGNOSIS — I4819 Other persistent atrial fibrillation: Secondary | ICD-10-CM

## 2016-08-07 LAB — POCT INR: INR: 2.6

## 2016-08-21 ENCOUNTER — Ambulatory Visit (INDEPENDENT_AMBULATORY_CARE_PROVIDER_SITE_OTHER): Payer: Medicare Other | Admitting: *Deleted

## 2016-08-21 DIAGNOSIS — I4891 Unspecified atrial fibrillation: Secondary | ICD-10-CM

## 2016-08-21 DIAGNOSIS — I359 Nonrheumatic aortic valve disorder, unspecified: Secondary | ICD-10-CM

## 2016-08-21 DIAGNOSIS — I4819 Other persistent atrial fibrillation: Secondary | ICD-10-CM

## 2016-08-21 DIAGNOSIS — I481 Persistent atrial fibrillation: Secondary | ICD-10-CM | POA: Diagnosis not present

## 2016-08-21 DIAGNOSIS — Z5181 Encounter for therapeutic drug level monitoring: Secondary | ICD-10-CM | POA: Diagnosis not present

## 2016-08-21 LAB — POCT INR: INR: 3.6

## 2016-08-22 ENCOUNTER — Ambulatory Visit
Admission: RE | Admit: 2016-08-22 | Discharge: 2016-08-22 | Disposition: A | Discharge status: Home / Self Care | Payer: Medicare Other | Source: Ambulatory Visit | Attending: Family Medicine | Admitting: Family Medicine

## 2016-08-22 DIAGNOSIS — Z1231 Encounter for screening mammogram for malignant neoplasm of breast: Secondary | ICD-10-CM

## 2016-08-30 ENCOUNTER — Ambulatory Visit (INDEPENDENT_AMBULATORY_CARE_PROVIDER_SITE_OTHER)
Admission: RE | Admit: 2016-08-30 | Discharge: 2016-08-30 | Disposition: A | Discharge status: Home / Self Care | Payer: Medicare Other | Source: Ambulatory Visit | Attending: Internal Medicine | Admitting: Internal Medicine

## 2016-08-30 ENCOUNTER — Encounter: Payer: Self-pay | Admitting: Internal Medicine

## 2016-08-30 ENCOUNTER — Ambulatory Visit (INDEPENDENT_AMBULATORY_CARE_PROVIDER_SITE_OTHER): Payer: Medicare Other | Admitting: Internal Medicine

## 2016-08-30 VITALS — BP 126/76 | HR 67 | Ht 67.0 in | Wt 235.0 lb

## 2016-08-30 DIAGNOSIS — R05 Cough: Secondary | ICD-10-CM | POA: Diagnosis not present

## 2016-08-30 DIAGNOSIS — J45991 Cough variant asthma: Secondary | ICD-10-CM | POA: Diagnosis not present

## 2016-08-30 DIAGNOSIS — R0609 Other forms of dyspnea: Secondary | ICD-10-CM

## 2016-08-30 MED ORDER — AZELASTINE-FLUTICASONE 137-50 MCG/ACT NA SUSP: 1.0000 | Freq: Two times a day (BID) | NASAL | 11 refills | Status: DC

## 2016-08-30 MED ORDER — AZELASTINE-FLUTICASONE 137-50 MCG/ACT NA SUSP: 1.0000 | Freq: Two times a day (BID) | NASAL | 0 refills | Status: DC

## 2016-08-30 NOTE — Patient Instructions (Addendum)
Dymista one twice daily until use up sample then if better  either fill it or change over to flonase over the counter if the prescription is too expensive   Please remember to go to the x-ray department downstairs in the basement  for your tests - we will call you with the results when they are available.     Return first week in September with pfts - call sooner if losing ground with exercise tolerance

## 2016-08-30 NOTE — Progress Notes (Signed)
Spoke with pt and notified of results per Dr. Wert. Pt verbalized understanding and denied any questions. 

## 2016-08-30 NOTE — Progress Notes (Signed)
Subjective:    Patient ID: [name], female   DOB: [birth year],   MRN: [id number]    Brief patient profile:  71yowf never smoker s/p AVR in 2006 then s/p LKR July 2016 and since then in and out of Afib assoc with sob / fatigue can last a few secs - days then feel fine in between referred to pulmonary clinic 04/03/2016 by Dr   Rayann Heman with abn pfts with h/o amiodarone exposure first in 2014 and restarted 03/19/16     History of Present Illness  04/03/2016 1st [facility] office visit/ [name]   Chief Complaint  Patient presents with  . Pulmonary Consult    Dr. Rayann Heman sent pt here, PFT showed advanced lung disease, pt states is fatigued and SOB  on best best days can walk the neighborhood x 7-8 min includes some hills for the first time as [place] lived [place] neighborhood and ever since new walk has noted more sob, but only on the hills Sleeps with cpap feeling fine/ [name] Each am wakes up with cough productive mucus is yellowy green x "a while" =  months / Not years assoc with nasal congestion but no wheezing  rec Augmentin 875 mg take one pill twice daily  X 10 days  Need to let the coumadin clinic know you are on this medication Ocean nasal spray as much as you want     06/01/2016  f/u ov/[name] re: still on amiodarone Chief Complaint  Patient presents with  . Follow-up    PFT's done today. Breathing is "fine" has good days and bad days. She c/o nasal congestion recently.   can walk 10 min around neighborhood x some steep hills slow her down but never has to stop  No more bad days since first of the year / feels like afib better and that's what is making the difference  Main concern is variable nasal congestion and sense of pnds x months, worse at hs and in am assoc with cough Has tried otcs no better rec CT sinus 06/08/16 > neg / allergy profile neg 04/03/16     08/30/2016  f/u ov/[name] re: amiodarone surveillance/ rhinitis with tendency to uacs  Chief  Complaint  Patient presents with  . Follow-up    Pt c/o constant nasal congestion. She also c/o non prod cough for the past several wks.    ex tol varies with no pattern and on best days can still do ex fine  Nasal congestion started end of  2017 / better with afrin worse at hs but thinks becoming addicted to it   No obvious day to day or daytime variability or assoc excess/ purulent sputum or mucus plugs or hemoptysis or cp or chest tightness, subjective wheeze or overt sinus or hb symptoms. No unusual exp hx or h/o childhood pna/ asthma or knowledge of premature birth.  Sleeping ok without nocturnal  or early am exacerbation  of respiratory  c/o's or need for noct saba. Also denies any obvious fluctuation of symptoms with weather or environmental changes or other aggravating or alleviating factors except as outlined above   Current Medications, Allergies, Complete Past Medical History, Past Surgical History, Family History, and Social History were reviewed in [employer] record.  ROS  The following are not active complaints unless bolded sore throat, dysphagia, dental problems, itching, sneezing,  nasal congestion or excess/ purulent secretions, ear ache,   fever, chills, sweats, unintended wt loss, classically pleuritic or exertional  cp,  orthopnea pnd or leg swelling, presyncope, palpitations, abdominal pain, anorexia, nausea, vomiting, diarrhea  or change in bowel or bladder habits, change in stools or urine, dysuria,hematuria,  rash, arthralgias, visual complaints, headache, numbness, weakness or ataxia or problems with walking or coordination,  change in mood/affect or memory.                  Objective:   Physical Exam  amb wf nad def nasal tone to voice    08/30/2016       235  06/01/2016       235   04/03/16 233 lb 6.4 oz (105.9 kg)  03/19/16 240 lb 12.8 oz (109.2 kg)  12/28/15 229 lb 6.4 oz (104.1 kg)    Vital signs reviewed  - Note on arrival 02  sats 97% RA   HEENT: nl dentition,  and oropharynx. Nl external ear canals without cough reflex -  Severe   bilateral non-specific turbinate edema  s purulence   NECK :  without JVD/Nodes/TM/ nl carotid upstrokes bilaterally   LUNGS: no acc muscle use,  Nl contour chest which is clear to A and P bilaterally without cough on insp or exp maneuvers   CV:  RRR mechanical S1  no s3 or murmur or increase in P2, nad no edema   ABD:  soft and nontender with nl inspiratory excursion in the supine position. No bruits or organomegaly appreciated, bowel sounds nl  MS:  Nl gait/ ext warm without deformities, calf tenderness, cyanosis or clubbing No obvious joint restrictions   SKIN: warm and dry without lesions    NEURO:  alert, approp, nl sensorium with  no motor or cerebellar deficits apparent.    CXR PA and Lateral:   08/30/2016 :    I personally reviewed images and agree with radiology impression as follows:    No active cardiopulmonary disease. .      Lab Results  Component Value Date   ESRSEDRATE 28 04/03/2016      Assessment:

## 2016-09-02 ENCOUNTER — Encounter: Payer: Self-pay | Admitting: Internal Medicine

## 2016-09-02 NOTE — Assessment & Plan Note (Signed)
Body mass index is 36.81 kg/m.  - no change from prior  Lab Results  Component Value Date   TSH 7.337 (H) 07/17/2016     Contributing to gerd risk/ doe/reviewed the need and the process to achieve and maintain neg calorie balance > defer f/u primary care including intermittently monitoring thyroid status on amiodarone

## 2016-09-02 NOTE — Assessment & Plan Note (Signed)
Amiodarone restarted 03/19/16  PFT's  03/21/2016  FEV1 1.50 (58 % ) ratio 80  p 14 % improvement from saba p nothing prior to study with DLCO  65/66c % corrects to 94  % for alv volume   - PFT's  06/01/2016  FEV1 1.57 (61 % ) ratio 84  p 22 % improvement from saba p nothing prior to study with DLCO  57/56 % corrects to 87  % for alv volume  - 08/30/2016  Walked RA x 3 laps @ 185 ft each stopped due to  End of study, fast pace, no sob or desat   No evidence of toxicity, rec continue walking/ f/u at 12/2016 for repeat pfts then yearly should do

## 2016-09-02 NOTE — Assessment & Plan Note (Signed)
-   FENO 04/03/2016  =   7  Allergy profile 04/03/2016 >  Eos 0.1 /  IgE  17 neg RAST   - Sinus CT 06/08/2016 > No evidence of sinusitis   - trial of dysmista 08/30/2016 >>>   .   Reviewed:  I emphasized that nasal steroids have no immediate benefit in terms of improving symptoms.  To help them reached the target tissue, the patient should use Afrin two puffs every 12 hours applied one min before using the nasal steroids.  Afrin should be stopped after no more than 5 days.  If the symptoms worsen, Afrin can be restarted after 5 days off of therapy to prevent rebound congestion from overuse of Afrin.  I also emphasized that in no way are nasal steroids a concern in terms of "addiction".    I had an extended discussion with the patient reviewing all relevant studies completed to date and  lasting 15 to 20 minutes of a 25 minute visit    Each maintenance medication was reviewed in detail including most importantly the difference between maintenance and prns and under what circumstances the prns are to be triggered using an action plan format that is not reflected in the computer generated alphabetically organized AVS.    Please see AVS for specific instructions unique to this visit that I personally wrote and verbalized to the the pt in detail and then reviewed with pt  by my nurse highlighting any  changes in therapy recommended at today's visit to their plan of care.

## 2016-09-05 ENCOUNTER — Ambulatory Visit (INDEPENDENT_AMBULATORY_CARE_PROVIDER_SITE_OTHER): Payer: Medicare Other | Admitting: *Deleted

## 2016-09-05 DIAGNOSIS — I481 Persistent atrial fibrillation: Secondary | ICD-10-CM | POA: Diagnosis not present

## 2016-09-05 DIAGNOSIS — I359 Nonrheumatic aortic valve disorder, unspecified: Secondary | ICD-10-CM

## 2016-09-05 DIAGNOSIS — Z5181 Encounter for therapeutic drug level monitoring: Secondary | ICD-10-CM | POA: Diagnosis not present

## 2016-09-05 DIAGNOSIS — I4891 Unspecified atrial fibrillation: Secondary | ICD-10-CM

## 2016-09-05 DIAGNOSIS — I4819 Other persistent atrial fibrillation: Secondary | ICD-10-CM

## 2016-09-05 LAB — POCT INR: INR: 3

## 2016-09-11 DIAGNOSIS — H2513 Age-related nuclear cataract, bilateral: Secondary | ICD-10-CM | POA: Diagnosis not present

## 2016-09-17 ENCOUNTER — Ambulatory Visit (INDEPENDENT_AMBULATORY_CARE_PROVIDER_SITE_OTHER): Payer: Medicare Other

## 2016-09-17 DIAGNOSIS — I359 Nonrheumatic aortic valve disorder, unspecified: Secondary | ICD-10-CM

## 2016-09-17 DIAGNOSIS — I4819 Other persistent atrial fibrillation: Secondary | ICD-10-CM

## 2016-09-17 DIAGNOSIS — I4891 Unspecified atrial fibrillation: Secondary | ICD-10-CM | POA: Diagnosis not present

## 2016-09-17 DIAGNOSIS — I481 Persistent atrial fibrillation: Secondary | ICD-10-CM

## 2016-09-17 DIAGNOSIS — Z5181 Encounter for therapeutic drug level monitoring: Secondary | ICD-10-CM | POA: Diagnosis not present

## 2016-09-17 LAB — POCT INR: INR: 2.6

## 2016-10-03 ENCOUNTER — Other Ambulatory Visit: Payer: Self-pay | Admitting: Cardiology

## 2016-10-03 ENCOUNTER — Other Ambulatory Visit: Payer: Self-pay | Admitting: Family Medicine

## 2016-10-03 DIAGNOSIS — I10 Essential (primary) hypertension: Secondary | ICD-10-CM | POA: Diagnosis not present

## 2016-10-03 DIAGNOSIS — E041 Nontoxic single thyroid nodule: Secondary | ICD-10-CM

## 2016-10-03 DIAGNOSIS — E785 Hyperlipidemia, unspecified: Secondary | ICD-10-CM | POA: Diagnosis not present

## 2016-10-03 DIAGNOSIS — Z0000 Encounter for general adult medical examination without abnormal findings: Secondary | ICD-10-CM | POA: Diagnosis not present

## 2016-10-03 DIAGNOSIS — F330 Major depressive disorder, recurrent, mild: Secondary | ICD-10-CM | POA: Diagnosis not present

## 2016-10-04 DIAGNOSIS — H25812 Combined forms of age-related cataract, left eye: Secondary | ICD-10-CM | POA: Diagnosis not present

## 2016-10-04 DIAGNOSIS — H2511 Age-related nuclear cataract, right eye: Secondary | ICD-10-CM | POA: Diagnosis not present

## 2016-10-09 ENCOUNTER — Ambulatory Visit (INDEPENDENT_AMBULATORY_CARE_PROVIDER_SITE_OTHER): Payer: Medicare Other | Admitting: *Deleted

## 2016-10-09 ENCOUNTER — Other Ambulatory Visit: Payer: Self-pay | Admitting: Gastroenterology

## 2016-10-09 DIAGNOSIS — I359 Nonrheumatic aortic valve disorder, unspecified: Secondary | ICD-10-CM

## 2016-10-09 DIAGNOSIS — I481 Persistent atrial fibrillation: Secondary | ICD-10-CM | POA: Diagnosis not present

## 2016-10-09 DIAGNOSIS — Z1211 Encounter for screening for malignant neoplasm of colon: Secondary | ICD-10-CM

## 2016-10-09 DIAGNOSIS — Z5181 Encounter for therapeutic drug level monitoring: Secondary | ICD-10-CM | POA: Diagnosis not present

## 2016-10-09 DIAGNOSIS — I4819 Other persistent atrial fibrillation: Secondary | ICD-10-CM

## 2016-10-09 DIAGNOSIS — I4891 Unspecified atrial fibrillation: Secondary | ICD-10-CM | POA: Diagnosis not present

## 2016-10-09 LAB — POCT INR: INR: 2.6

## 2016-10-11 ENCOUNTER — Ambulatory Visit
Admission: RE | Admit: 2016-10-11 | Discharge: 2016-10-11 | Disposition: A | Discharge status: Home / Self Care | Payer: Medicare Other | Source: Ambulatory Visit | Attending: Family Medicine | Admitting: Family Medicine

## 2016-10-11 DIAGNOSIS — E042 Nontoxic multinodular goiter: Secondary | ICD-10-CM | POA: Diagnosis not present

## 2016-10-11 DIAGNOSIS — E041 Nontoxic single thyroid nodule: Secondary | ICD-10-CM

## 2016-10-17 DIAGNOSIS — H2512 Age-related nuclear cataract, left eye: Secondary | ICD-10-CM | POA: Diagnosis not present

## 2016-10-17 DIAGNOSIS — H25812 Combined forms of age-related cataract, left eye: Secondary | ICD-10-CM | POA: Diagnosis not present

## 2016-10-23 ENCOUNTER — Ambulatory Visit
Admission: RE | Admit: 2016-10-23 | Discharge: 2016-10-23 | Disposition: A | Discharge status: Home / Self Care | Payer: Medicare Other | Source: Ambulatory Visit | Attending: Gastroenterology | Admitting: Gastroenterology

## 2016-10-23 DIAGNOSIS — K8020 Calculus of gallbladder without cholecystitis without obstruction: Secondary | ICD-10-CM | POA: Diagnosis not present

## 2016-10-23 DIAGNOSIS — Z1211 Encounter for screening for malignant neoplasm of colon: Secondary | ICD-10-CM

## 2016-10-30 ENCOUNTER — Ambulatory Visit (INDEPENDENT_AMBULATORY_CARE_PROVIDER_SITE_OTHER): Payer: Medicare Other

## 2016-10-30 DIAGNOSIS — I481 Persistent atrial fibrillation: Secondary | ICD-10-CM | POA: Diagnosis not present

## 2016-10-30 DIAGNOSIS — I359 Nonrheumatic aortic valve disorder, unspecified: Secondary | ICD-10-CM | POA: Diagnosis not present

## 2016-10-30 DIAGNOSIS — Z5181 Encounter for therapeutic drug level monitoring: Secondary | ICD-10-CM

## 2016-10-30 DIAGNOSIS — I4891 Unspecified atrial fibrillation: Secondary | ICD-10-CM | POA: Diagnosis not present

## 2016-10-30 DIAGNOSIS — I4819 Other persistent atrial fibrillation: Secondary | ICD-10-CM

## 2016-10-30 LAB — POCT INR: INR: 2.6

## 2016-11-01 DIAGNOSIS — H2511 Age-related nuclear cataract, right eye: Secondary | ICD-10-CM | POA: Diagnosis not present

## 2016-11-01 DIAGNOSIS — Z01818 Encounter for other preprocedural examination: Secondary | ICD-10-CM | POA: Diagnosis not present

## 2016-11-05 DIAGNOSIS — I119 Hypertensive heart disease without heart failure: Secondary | ICD-10-CM | POA: Diagnosis not present

## 2016-11-05 DIAGNOSIS — Z952 Presence of prosthetic heart valve: Secondary | ICD-10-CM | POA: Diagnosis not present

## 2016-11-05 DIAGNOSIS — F419 Anxiety disorder, unspecified: Secondary | ICD-10-CM | POA: Diagnosis not present

## 2016-11-05 DIAGNOSIS — I350 Nonrheumatic aortic (valve) stenosis: Secondary | ICD-10-CM | POA: Diagnosis not present

## 2016-11-05 DIAGNOSIS — F331 Major depressive disorder, recurrent, moderate: Secondary | ICD-10-CM | POA: Diagnosis not present

## 2016-11-05 DIAGNOSIS — I5032 Chronic diastolic (congestive) heart failure: Secondary | ICD-10-CM | POA: Diagnosis not present

## 2016-11-05 DIAGNOSIS — I779 Disorder of arteries and arterioles, unspecified: Secondary | ICD-10-CM | POA: Diagnosis not present

## 2016-11-09 DIAGNOSIS — E041 Nontoxic single thyroid nodule: Secondary | ICD-10-CM | POA: Diagnosis not present

## 2016-11-15 DIAGNOSIS — H2511 Age-related nuclear cataract, right eye: Secondary | ICD-10-CM | POA: Diagnosis not present

## 2016-11-27 ENCOUNTER — Ambulatory Visit (INDEPENDENT_AMBULATORY_CARE_PROVIDER_SITE_OTHER): Payer: Medicare Other

## 2016-11-27 DIAGNOSIS — I481 Persistent atrial fibrillation: Secondary | ICD-10-CM

## 2016-11-27 DIAGNOSIS — Z5181 Encounter for therapeutic drug level monitoring: Secondary | ICD-10-CM | POA: Diagnosis not present

## 2016-11-27 DIAGNOSIS — I4819 Other persistent atrial fibrillation: Secondary | ICD-10-CM

## 2016-11-27 LAB — POCT INR: INR: 1.8

## 2016-12-11 ENCOUNTER — Encounter: Payer: Self-pay | Admitting: Internal Medicine

## 2016-12-11 ENCOUNTER — Ambulatory Visit (INDEPENDENT_AMBULATORY_CARE_PROVIDER_SITE_OTHER): Payer: Medicare Other | Admitting: Internal Medicine

## 2016-12-11 ENCOUNTER — Ambulatory Visit (INDEPENDENT_AMBULATORY_CARE_PROVIDER_SITE_OTHER): Payer: Medicare Other | Admitting: *Deleted

## 2016-12-11 VITALS — BP 138/80 | HR 74 | Ht 67.0 in | Wt 239.0 lb

## 2016-12-11 DIAGNOSIS — R0609 Other forms of dyspnea: Secondary | ICD-10-CM

## 2016-12-11 DIAGNOSIS — I481 Persistent atrial fibrillation: Secondary | ICD-10-CM | POA: Diagnosis not present

## 2016-12-11 DIAGNOSIS — Z5181 Encounter for therapeutic drug level monitoring: Secondary | ICD-10-CM | POA: Diagnosis not present

## 2016-12-11 DIAGNOSIS — J45991 Cough variant asthma: Secondary | ICD-10-CM

## 2016-12-11 DIAGNOSIS — I4819 Other persistent atrial fibrillation: Secondary | ICD-10-CM

## 2016-12-11 LAB — PULMONARY FUNCTION TEST
DL/VA % PRED: 103 %
DL/VA: 5.31 ml/min/mmHg/L
DLCO COR: 17.08 ml/min/mmHg
DLCO cor % pred: 60 %
DLCO unc % pred: 61 %
DLCO unc: 17.34 ml/min/mmHg
FEF 25-75 PRE: 0.88 L/s
FEF 25-75 Post: 2.2 L/sec
FEF2575-%CHANGE-POST: 149 %
FEF2575-%PRED-POST: 109 %
FEF2575-%PRED-PRE: 43 %
FEV1-%Change-Post: 30 %
FEV1-%Pred-Post: 63 %
FEV1-%Pred-Pre: 48 %
FEV1-Post: 1.59 L
FEV1-Pre: 1.22 L
FEV1FVC-%CHANGE-POST: 7 %
FEV1FVC-%PRED-PRE: 98 %
FEV6-%Change-Post: 21 %
FEV6-%Pred-Post: 62 %
FEV6-%Pred-Pre: 51 %
FEV6-PRE: 1.63 L
FEV6-Post: 1.98 L
FEV6FVC-%Pred-Post: 105 %
FEV6FVC-%Pred-Pre: 105 %
FVC-%Change-Post: 21 %
FVC-%PRED-POST: 59 %
FVC-%Pred-Pre: 48 %
FVC-Post: 1.98 L
FVC-Pre: 1.63 L
POST FEV1/FVC RATIO: 80 %
PRE FEV6/FVC RATIO: 100 %
Post FEV6/FVC ratio: 100 %
Pre FEV1/FVC ratio: 75 %
RV % pred: 82 %
RV: 1.95 L
TLC % pred: 74 %
TLC: 4.1 L

## 2016-12-11 LAB — POCT INR: INR: 1.6

## 2016-12-11 MED ORDER — BUDESONIDE-FORMOTEROL FUMARATE 160-4.5 MCG/ACT IN AERO
2.0000 | INHALATION_SPRAY | Freq: Two times a day (BID) | RESPIRATORY_TRACT | 11 refills | Status: DC
Start: 2016-12-11 — End: 2017-01-22

## 2016-12-11 MED ORDER — BUDESONIDE-FORMOTEROL FUMARATE 160-4.5 MCG/ACT IN AERO: 2.0000 | INHALATION_SPRAY | Freq: Two times a day (BID) | RESPIRATORY_TRACT | 0 refills | Status: DC

## 2016-12-11 NOTE — Progress Notes (Signed)
PFT done today. 

## 2016-12-11 NOTE — Progress Notes (Signed)
Subjective:    Patient ID: [name], female   DOB: 12-24-1945,   MRN: [id number]    Brief patient profile:  71yowf never smoker  s/p AVR in 2006 then s/p LKR July 2016 and since then in and out of Afib assoc with sob / fatigue can last a few secs - days then feel fine in between referred to pulmonary clinic 04/03/2016 by Dr   Rayann Heman with abn pfts with h/o amiodarone exposure first in 2014 and restarted 03/19/16 with pfts c/w reversible airflow obst 12/11/2016     History of Present Illness  04/03/2016 1st [facility] office visit/ Rama Mcclintock   Chief Complaint  Patient presents with  . Pulmonary Consult    Dr. Rayann Heman sent pt here, PFT showed advanced lung disease, pt states is fatigued and SOB  on best best days can walk the neighborhood x 7-8 min includes some hills for the first time as previously lived [place]atter neighborhood and ever since new walk has noted more sob, but only on the hills Sleeps with cpap feeling fine/ [name] Each am wakes up with cough productive mucus is yellowy green x "a while" =  months / Not years assoc with nasal congestion but no wheezing  rec Augmentin 875 mg take one pill twice daily  X 10 days  Need to let the coumadin clinic know you are on this medication Ocean nasal spray as much as you want     06/01/2016  f/u ov/Marly Schuld re: still on amiodarone Chief Complaint  Patient presents with  . Follow-up    PFT's done today. Breathing is "fine" has good days and bad days. She c/o nasal congestion recently.   can walk 10 min around neighborhood x some steep hills slow her down but never has to stop  No more bad days since first of the year / feels like afib better and that's what is making the difference  Main concern is variable nasal congestion and sense of pnds x months, worse at hs and in am assoc with cough Has tried otcs no better rec CT sinus 06/08/16 > neg / allergy profile neg 04/03/16     08/30/2016  f/u ov/Wardell Pokorski re: amiodarone  surveillance/ rhinitis with tendency to uacs  Chief Complaint  Patient presents with  . Follow-up    Pt c/o constant nasal congestion. She also c/o non prod cough for the past several wks.    ex tol varies with no pattern and on best days can still do ex fine  Nasal congestion started end of  2017 / better with afrin worse at hs but thinks becoming addicted to it  rec Dymista one twice daily until use up sample then if better  either fill it or change over to flonase over the counter if the prescription is too expensive     12/11/2016  f/u ov/Chelby Salata re:   Asthma/ rhinitis neg allergy eval  Chief Complaint  Patient presents with  . Follow-up    PFT done today, still SOB with exertion   continues with variable ex tol s specific pattern she is aware of  Nasal symptoms resolved p took dymista and did not refill and they have not recurred s need for otcs  Has never used saba before   No obvious day to day or daytime variability or assoc excess/ purulent sputum or mucus plugs or hemoptysis or cp or chest tightness, subjective wheeze or overt sinus or hb symptoms. No unusual exp hx or  h/o childhood pna/ asthma or knowledge of premature birth.  Sleeping ok without nocturnal  or early am exacerbation  of respiratory  c/o's or need for noct saba. Also denies any obvious fluctuation of symptoms with weather or environmental changes or other aggravating or alleviating factors except as outlined above   Current Medications, Allergies, Complete Past Medical History, Past Surgical History, Family History, and Social History were reviewed in [employer] record.  ROS  The following are not active complaints unless bolded sore throat, dysphagia, dental problems, itching, sneezing,  nasal congestion or excess/ purulent secretions, ear ache,   fever, chills, sweats, unintended wt loss, classically pleuritic or exertional cp,  orthopnea pnd or leg swelling, presyncope, palpitations,  abdominal pain, anorexia, nausea, vomiting, diarrhea  or change in bowel or bladder habits, change in stools or urine, dysuria,hematuria,  rash, arthralgias, visual complaints, headache, numbness, weakness or ataxia or problems with walking or coordination,  change in mood/affect or memory.               Objective:   Physical Exam  amb wf nad    12/11/2016         239  08/30/2016       235  06/01/2016       235   04/03/16 233 lb 6.4 oz (105.9 kg)  03/19/16 240 lb 12.8 oz (109.2 kg)  12/28/15 229 lb 6.4 oz (104.1 kg)    Vital signs reviewed  - Note on arrival 02 sats 94% RA   HEENT: nl dentition,  and oropharynx. Nl external ear canals without cough reflex -  Mild/moderate  bilateral non-specific turbinate edema  s purulence   NECK :  without JVD/Nodes/TM/ nl carotid upstrokes bilaterally   LUNGS: no acc muscle use,  Nl contour chest which is clear to A and P bilaterally without cough on insp or exp maneuvers   CV:  RRR mechanical S1  no s3 or murmur or increase in P2, nad no edema   ABD:  soft and nontender with nl inspiratory excursion in the supine position. No bruits or organomegaly appreciated, bowel sounds nl  MS:  Nl gait/ ext warm without deformities, calf tenderness, cyanosis or clubbing No obvious joint restrictions   SKIN: warm and dry without lesions    NEURO:  alert, approp, nl sensorium with  no motor or cerebellar deficits apparent.        Lab Results  Component Value Date   ESRSEDRATE 28 04/03/2016      Assessment:

## 2016-12-11 NOTE — Patient Instructions (Signed)
Try symicort 160 Take 2 puffs first thing in am and then another 2 puffs about 12 hours later.    Work on inhaler technique:  relax and gently blow all the way out then take a nice smooth deep breath back in, triggering the inhaler at same time you start breathing in.  Hold for up to 5 seconds if you can. Blow out thru nose. Rinse and gargle with water when done      Please schedule a follow up office visit in 6 weeks, call sooner if needed

## 2016-12-12 NOTE — Assessment & Plan Note (Signed)
Amiodarone restarted 03/19/16  PFT's  03/21/2016  FEV1 1.50 (58 % ) ratio 80  p 14 % improvement from saba p nothing prior to study with DLCO  65/66c % corrects to 94  % for alv volume   - PFT's  06/01/2016  FEV1 1.57 (61 % ) ratio 84  p 22 % improvement from saba p nothing prior to study with DLCO  57/56 % corrects to 87  % for alv volume  - 08/30/2016  Walked RA x 3 laps @ 185 ft each stopped due to  End of study, fast pace, no sob or desat   - PFT's 12/11/2016  No change in dlco    No evidence of amio toxicity > ok to continue per cards

## 2016-12-12 NOTE — Assessment & Plan Note (Signed)
PFT's  03/21/2016  FEV1 1.50 (58 % ) ratio 80  p 14 % improvement from saba p nothing prior to study with DLCO  65/66c % corrects to 94  % for alv volume   - FENO 04/03/2016  =   7  Allergy profile 04/03/2016 >  Eos 0.1 /  IgE  17 neg RAST - PFT's  06/01/2016  FEV1 1.57 (61 % ) ratio 84  p 22 % improvement from saba p nothing prior to study with DLCO  57/56 % corrects to 87  % for alv volume  - Sinus CT 06/08/2016 > No evidence of sinusitis - trial of dysmista 08/30/2016 > resolved and did not recur off rx as of 12/11/2016   - PFT's  12/11/2016  FEV1 1.59 (63 % ) ratio 80  p 30 % improvement from saba p nothing prior to study with DLCO  61/60 % corrects to 103  % for alv volume  With erv 9% and classic obst curvature  - 12/11/2016  After extensive coaching HFA effectiveness =    75% >  Try symb 160 2bid    The variability she describes is reflected in the results of spirometry and response to saba so clearly has asthma and needs a trial of symb 160 2 bid then regroup in 6 weeks   I had an extended discussion with the patient reviewing all relevant studies completed to date and  lasting 15 to 20 minutes of a 25 minute visit    Each maintenance medication was reviewed in detail including most importantly the difference between maintenance and prns and under what circumstances the prns are to be triggered using an action plan format that is not reflected in the computer generated alphabetically organized AVS.    Please see AVS for specific instructions unique to this visit that I personally wrote and verbalized to the the pt in detail and then reviewed with pt  by my nurse highlighting any  changes in therapy recommended at today's visit to their plan of care.

## 2016-12-12 NOTE — Assessment & Plan Note (Signed)
ERV 9% by pfts 12/11/2016   Body mass index is 37.43 kg/m.  -  trending up Lab Results  Component Value Date   TSH 7.337 (H) 07/17/2016     Contributing to gerd risk/ doe/reviewed the need and the process to achieve and maintain neg calorie balance > defer f/u primary care including intermittently monitoring thyroid status

## 2016-12-24 ENCOUNTER — Ambulatory Visit: Payer: Medicare Other | Admitting: Cardiology

## 2016-12-24 NOTE — Progress Notes (Deleted)
Cardiology Office Note:    Date:  12/24/2016   ID:  [name], DOB March 19, 1946, MRN [id number]  PCP:  Meredith Small, MD  Cardiologist:  Meredith Him, MD   Referring MD: Meredith Small, MD   No chief complaint on file.   History of Present Illness:    STARSHA [name] is a 71 y.o. female with a hx of with a history of afib s/p failed Tikosyn load and recurrent DCCV which failed to maintain NSR. She was loaded on amio and underwent recurrent DCCV to NSR but this did not hold and she reverted back to afib. She underwent afib ablation at [place] is now on Tikosyn. She had recurrent afib and underwent repeat afib ablation 10/2015 at [facility] and is now on Amio.  She has been maintaining NSR since then.  She also has a history of OSA on CPAP at 12cm H2O, AS s/p mechanical AVR and chronic anticoagulation.She denies any anginal chest pain,dizziness orsyncope.She has chronic DOE which is stable. Her LE edema has resolved.  She is doing well with her CPAP. She uses the nasal pillow mask with chin strap.She feels rested in the am and has no daytime sleepiness. She sleeps well at night and never wakes up at night.  She denies any significant problems with dry mouth but occasionally has some mild nasal congestion.    Past Medical History:  Diagnosis Date  . Anxiety   . Aortic stenosis    status post aortic valve replacement with St. Jude mechanical prosthesis  . 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   68mm by echo 06/2016 and 49mm by CT angin 2016  . Chronic anticoagulation   . 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 Depression   . DJD (degenerative joint disease) of knee   . Dyslipidemia   . Exertional shortness of breath   . GERD (gastroesophageal reflux disease)    "several years ago; none since" (11/12/2012)  . Hyperlipidemia   . Hypertension   . Left atrial enlargement   . Mitral regurgitation 06/25/2016   Mild moderate MR by echo 06/2016    . Obesity   . Persistent atrial fibrillation (HCC)    s/p afib ablation x 2 at [facility]  . Sleep apnea    On CPAP at 12cm H2O  . 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 Valvular heart disease     Past Surgical History:  Procedure Laterality Date  . BURR HOLE FOR SUBDURAL HEMATOMA  2006  . CARDIAC VALVE REPLACEMENT  2006   St. Jude AVR  . CARDIOVERSION N/A 07/22/2012   Procedure: CARDIOVERSION;  Surgeon: Meredith Furbish, MD;  Location: [facility] ENDOSCOPY;  Service: Cardiovascular;  Laterality: N/A;  . CARDIOVERSION N/A 11/25/2012   Procedure: CARDIOVERSION;  Surgeon: Meredith Margarita, MD;  Location: [facility] ENDOSCOPY;  Service: Cardiovascular;  Laterality: N/A;  . CARDIOVERSION N/A 12/30/2012   Procedure: CARDIOVERSION;  Surgeon: Meredith Margarita, MD;  Location: [facility] ENDOSCOPY;  Service: Cardiovascular;  Laterality: N/A;  h&p in file-HW   . CARDIOVERSION N/A 03/30/2013   Procedure: CARDIOVERSION;  Surgeon: Meredith Margarita, MD;  Location: [facility] ENDOSCOPY;  Service: Cardiovascular;  Laterality: N/A;  . ELECTROPHYSIOLOGIC STUDY  11/2013   Afib ablation x 2 (11/2013 and 10/2015) at [facility] by Dr [name] with recurrence post ablation  . KNEE ARTHROSCOPY Left 1980's   "  2" (11/12/2012)  . KNEE SURGERY Left 1980's   "after 2 scopes they went in and did some kind of OR" (11/12/2012)  . TEE WITHOUT CARDIOVERSION N/A 07/22/2012   Procedure: TRANSESOPHAGEAL ECHOCARDIOGRAM (TEE);  Surgeon: Meredith Furbish, MD;  Location: [facility] ENDOSCOPY;  Service: Cardiovascular;  Laterality: N/A;  Rm 2034  . TEE WITHOUT CARDIOVERSION N/A 10/07/2013   Procedure: TRANSESOPHAGEAL ECHOCARDIOGRAM (TEE);  Surgeon: Meredith Furbish, MD;  Location: [facility] ENDOSCOPY;  Service: Cardiovascular;  Laterality: N/A;  . TOTAL KNEE ARTHROPLASTY Left 11/05/2014   Procedure: TOTAL KNEE ARTHROPLASTY;  Surgeon: Meredith Leitz, MD;  Location: [place];  Service: Orthopedics;  Laterality: Left;  . TUBAL  LIGATION  1980's    Current Medications: No outpatient prescriptions have been marked as taking for the 12/24/16 encounter (Appointment) with Meredith Margarita, MD.     Allergies:   Codeine; Beta adrenergic blockers; Metoprolol; Propoxyphene; Toprol xl [metoprolol tartrate]; and Dilantin [phenytoin sodium extended]   Social History   Social History  . Marital status: Married    Spouse name: N/A  . Number of children: N/A  . Years of education: N/A   Social History Main Topics  . Smoking status: Never Smoker  . Smokeless tobacco: Never Used  . Alcohol use No     Comment: 11/12/2012 "no alcohol in the last 9 years"  . Drug use: No  . Sexual activity: Not Currently   Other Topics Concern  . Not on file   Social History Narrative   Pt lives in [place] with spouse.  Retired     Family History: The patient's ***family history includes Heart disease in her brother; Hyperlipidemia in her father; Hypertension in her father and mother; Lung cancer in her mother.  ROS:   Please see the history of present illness.    *** All other systems reviewed and are negative.  EKGs/Labs/Other Studies Reviewed:    The following studies were reviewed today: ***  EKG:  EKG is *** ordered today.  The ekg ordered today demonstrates ***  Recent Labs: 06/25/2016: ALT 20; BUN 16; Creatinine, Ser 0.80; Hemoglobin 12.8; Platelets 286; Potassium 3.9; Sodium 141 07/17/2016: TSH 7.337   Recent Lipid Panel    Component Value Date/Time   CHOL 201 (H) 06/25/2016 1011   TRIG 95 06/25/2016 1011   HDL 72 06/25/2016 1011   CHOLHDL 2.8 06/25/2016 1011   LDLCALC 110 (H) 06/25/2016 1011    Physical Exam:    VS:  There were no vitals taken for this visit.    Wt Readings from Last 3 Encounters:  12/11/16 239 lb (108.4 kg)  08/30/16 235 lb (106.6 kg)  07/17/16 243 lb 3.2 oz (110.3 kg)     GEN: *** Well nourished, well developed in no acute distress HEENT: Normal NECK: No JVD; No carotid  bruits LYMPHATICS: No lymphadenopathy CARDIAC: ***RRR, no murmurs, rubs, gallops RESPIRATORY:  Clear to auscultation without rales, wheezing or rhonchi  ABDOMEN: Soft, non-tender, non-distended MUSCULOSKELETAL:  No edema; No deformity  SKIN: Warm and dry NEUROLOGIC:  Alert and oriented x 3 PSYCHIATRIC:  Normal affect   ASSESSMENT:    No diagnosis found. PLAN:    In order of problems listed above:  1. ***   Medication Adjustments/Labs and Tests Ordered: Current medicines are reviewed at length with the patient today.  Concerns regarding medicines are outlined above.  No orders of the defined types were placed in this encounter.  No orders of the defined types were placed in this encounter.  Signed, Meredith Him, MD  12/24/2016 9:30 AM    [place]

## 2016-12-28 DIAGNOSIS — Z23 Encounter for immunization: Secondary | ICD-10-CM | POA: Diagnosis not present

## 2016-12-28 DIAGNOSIS — E039 Hypothyroidism, unspecified: Secondary | ICD-10-CM | POA: Diagnosis not present

## 2016-12-29 ENCOUNTER — Other Ambulatory Visit: Payer: Self-pay | Admitting: Cardiology

## 2017-01-01 ENCOUNTER — Ambulatory Visit (INDEPENDENT_AMBULATORY_CARE_PROVIDER_SITE_OTHER): Payer: Medicare Other | Admitting: *Deleted

## 2017-01-01 ENCOUNTER — Other Ambulatory Visit: Payer: Self-pay | Admitting: Cardiology

## 2017-01-01 DIAGNOSIS — I4819 Other persistent atrial fibrillation: Secondary | ICD-10-CM

## 2017-01-01 DIAGNOSIS — I481 Persistent atrial fibrillation: Secondary | ICD-10-CM

## 2017-01-01 DIAGNOSIS — Z5181 Encounter for therapeutic drug level monitoring: Secondary | ICD-10-CM

## 2017-01-01 LAB — POCT INR: INR: 2.4

## 2017-01-11 DIAGNOSIS — F419 Anxiety disorder, unspecified: Secondary | ICD-10-CM | POA: Diagnosis not present

## 2017-01-11 DIAGNOSIS — E559 Vitamin D deficiency, unspecified: Secondary | ICD-10-CM | POA: Diagnosis not present

## 2017-01-14 ENCOUNTER — Ambulatory Visit (INDEPENDENT_AMBULATORY_CARE_PROVIDER_SITE_OTHER): Payer: Medicare Other | Admitting: Cardiology

## 2017-01-14 ENCOUNTER — Encounter: Payer: Self-pay | Admitting: Cardiology

## 2017-01-14 VITALS — BP 136/72 | HR 70 | Ht 67.0 in | Wt 241.4 lb

## 2017-01-14 DIAGNOSIS — I5032 Chronic diastolic (congestive) heart failure: Secondary | ICD-10-CM

## 2017-01-14 DIAGNOSIS — I350 Nonrheumatic aortic (valve) stenosis: Secondary | ICD-10-CM

## 2017-01-14 DIAGNOSIS — G4733 Obstructive sleep apnea (adult) (pediatric): Secondary | ICD-10-CM | POA: Diagnosis not present

## 2017-01-14 DIAGNOSIS — I4819 Other persistent atrial fibrillation: Secondary | ICD-10-CM

## 2017-01-14 DIAGNOSIS — E785 Hyperlipidemia, unspecified: Secondary | ICD-10-CM | POA: Diagnosis not present

## 2017-01-14 DIAGNOSIS — I10 Essential (primary) hypertension: Secondary | ICD-10-CM

## 2017-01-14 DIAGNOSIS — I481 Persistent atrial fibrillation: Secondary | ICD-10-CM | POA: Diagnosis not present

## 2017-01-14 DIAGNOSIS — I77810 Thoracic aortic ectasia: Secondary | ICD-10-CM | POA: Diagnosis not present

## 2017-01-14 NOTE — Patient Instructions (Signed)
Medication Instructions:  Your physician recommends that you continue on your current medications as directed. Please refer to the Current Medication list given to you today.  -- If you need a refill on your cardiac medications before your next appointment, please call your pharmacy. --  Labwork: None ordered  Testing/Procedures: None ordered  Follow-Up: Your physician wants you to follow-up in: 6 months with Dr. Radford Pax.  You will receive a reminder letter in the mail two months in advance. If you don't receive a letter, please call our office to schedule the follow-up appointment.  Thank you for choosing [facility]!!     Any Other Special Instructions Will Be Listed Below (If Applicable).

## 2017-01-14 NOTE — Progress Notes (Signed)
Cardiology Office Note:    Date:  01/19/2017   ID:  [name], DOB [birth year], MRN [id number]  PCP:  Maurice Small, MD  Cardiologist:  Fransico Him, MD   Referring MD: Maurice Small, MD   Chief Complaint  Patient presents with  . Aortic Stenosis  . Hypertension  . Atrial Fibrillation    History of Present Illness:    [name] is a 71 y.o. female with a hx of afib s/p failed Tikosyn load and recurrent DCCV which failed to maintain NSR. She was loaded on amio and underwent recurrent DCCV to NSR but this did not hold and she reverted back to afib. She underwent afib ablation at [place] is now on Tikosyn. She had recurrent afib and underwent repeat afib ablation 10/2015 at [facility] and is now on Amio.  She has been maintaining NSR since then.  She also has a history of OSA on CPAP at 12cm H2O, AS s/p mechanical AVR and chronic anticoagulation.  She is here today for followup and is doing well.  She denies any chest pain or pressure, PND, orthopnea, LE edema, dizziness, palpitations or syncope. She has chronic DOE and is followed by Dr. Melvyn Novas for newly diagnosed asthma.  This is stable at this time. She is compliant with her meds and is tolerating meds with no SE.  She is doing well with her CPAP device and thinks that she has gotten used to it.  She tolerates the nasal pillow mask and feels the pressure is adequate.  Since going on CPAP she feels rested in the am and has no significant daytime sleepiness.  She denies any significant mouth or nasal dryness or nasal congestion.  She does not think that he snores.     Past Medical History:  Diagnosis Date  . Anxiety   . Aortic stenosis    status post aortic valve replacement with St. Jude mechanical prosthesis  . 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   5mm by echo 06/2016 and 45mm by CT angin 2016  . Chronic anticoagulation   . 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 Depression   . DJD  (degenerative joint disease) of knee   . Dyslipidemia   . GERD (gastroesophageal reflux disease)    "several years ago; none since" (11/12/2012)  . Hyperlipidemia   . Hyperlipidemia LDL goal <70 01/19/2017  . Hypertension   . Left atrial enlargement   . Mitral regurgitation 06/25/2016   Mild moderate MR by echo 06/2016  . Obesity   . Persistent atrial fibrillation (HCC)    s/p afib ablation x 2 at [facility]  . Sleep apnea    On CPAP at 12cm H2O  . 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 - now cleared by neurosurgery to maintain INR 2-2.5    Past Surgical History:  Procedure Laterality Date  . BURR HOLE FOR SUBDURAL HEMATOMA  2006  . CARDIAC VALVE REPLACEMENT  2006   St. Jude AVR  . CARDIOVERSION N/A 07/22/2012   Procedure: CARDIOVERSION;  Surgeon: [name], MD;  Location: [facility] ENDOSCOPY;  Service: Cardiovascular;  Laterality: N/A;  . CARDIOVERSION N/A 11/25/2012   Procedure: CARDIOVERSION;  Surgeon: [name], MD;  Location: [facility] ENDOSCOPY;  Service: Cardiovascular;  Laterality: N/A;  . CARDIOVERSION N/A 12/30/2012   Procedure: CARDIOVERSION;  Surgeon: [name], MD;  Location: [place];  Service: Cardiovascular;  Laterality: N/A;  h&p in file-HW   . CARDIOVERSION N/A 03/30/2013   Procedure: CARDIOVERSION;  Surgeon: [name], MD;  Location: [facility] ENDOSCOPY;  Service: Cardiovascular;  Laterality: N/A;  . ELECTROPHYSIOLOGIC STUDY  11/2013   Afib ablation x 2 (11/2013 and 10/2015) at [facility] by Dr Clyda Hurdle with recurrence post ablation  . KNEE ARTHROSCOPY Left 1980's   "2" (11/12/2012)  . KNEE SURGERY Left 1980's   "after 2 scopes they went in and did some kind of OR" (11/12/2012)  . TEE WITHOUT CARDIOVERSION N/A 07/22/2012   Procedure: TRANSESOPHAGEAL ECHOCARDIOGRAM (TEE);  Surgeon: [name], MD;  Location: [facility] ENDOSCOPY;  Service: Cardiovascular;  Laterality: N/A;  Rm 2034  . TEE WITHOUT CARDIOVERSION N/A 10/07/2013   Procedure: TRANSESOPHAGEAL  ECHOCARDIOGRAM (TEE);  Surgeon: [name], MD;  Location: [facility] ENDOSCOPY;  Service: Cardiovascular;  Laterality: N/A;  . TOTAL KNEE ARTHROPLASTY Left 11/05/2014   Procedure: TOTAL KNEE ARTHROPLASTY;  Surgeon: Dorna Leitz, MD;  Location: [place];  Service: Orthopedics;  Laterality: Left;  . TUBAL LIGATION  1980's    Current Medications: Current Meds  Medication Sig  . ALPRAZolam (XANAX) 0.5 MG tablet Take 0.5 mg by mouth 2 (two) times daily as needed. AS NEEDED FOR ANXIETY  . amiodarone (PACERONE) 200 MG tablet Take 1 tablet (200 mg total) by mouth daily.  . Ascorbic Acid (VITAMIN C) 1000 MG tablet Take 2,000 mg by mouth daily.  . budesonide-formoterol (SYMBICORT) 160-4.5 MCG/ACT inhaler Inhale 2 puffs into the lungs 2 (two) times daily.  . calcium-vitamin D (OSCAL WITH D) 500-200 MG-UNIT per tablet Take 2 tablets by mouth daily.   . Cholecalciferol (VITAMIN D) 2000 UNITS tablet Take 4,000 Units by mouth daily.  . diazepam (VALIUM) 10 MG tablet Take 10 mg by mouth every 6 (six) hours as needed for anxiety.  Marland Kitchen DILT-XR 240 MG 24 hr capsule TAKE 1 CAPSULE BY MOUTH EVERY MORNING ON AN EMPTY STOMACH  . DULoxetine (CYMBALTA) 60 MG capsule Take 60 mg by mouth daily.  . furosemide (LASIX) 80 MG tablet TAKE 1 TABLET BY MOUTH EVERY DAY  . levothyroxine (SYNTHROID, LEVOTHROID) 75 MCG tablet Take 75 mcg by mouth daily.  Marland Kitchen losartan (COZAAR) 100 MG tablet Take 100 mg by mouth daily.  . Multiple Vitamin (MULTIVITAMIN WITH MINERALS) TABS Take 1 tablet by mouth daily.  . potassium chloride SA (K-DUR,KLOR-CON) 20 MEQ tablet TAKE 2 TABLETS BY MOUTH EVERY MORNING AND 1 TABLET EVERY EVENING  . pravastatin (PRAVACHOL) 10 MG tablet Take 10 mg by mouth daily.  Marland Kitchen tretinoin (RETIN-A) 0.05 % cream Apply 1 application topically at bedtime.   . VENTOLIN HFA 108 (90 Base) MCG/ACT inhaler Inhale 2 puffs into the lungs every 6 (six) hours as needed.  . warfarin (COUMADIN) 4 MG tablet TAKE AS DIRECTED PER COUMADIN CLINIC  .  zolpidem (AMBIEN) 10 MG tablet Take 5 mg by mouth at bedtime as needed for sleep.     Allergies:   Codeine; Beta adrenergic blockers; Metoprolol; Propoxyphene; Toprol xl [metoprolol tartrate]; and Dilantin [phenytoin sodium extended]   Social History   Social History  . Marital status: Married    Spouse name: N/A  . Number of children: N/A  . Years of education: N/A   Social History Main Topics  . Smoking status: Never Smoker  . Smokeless tobacco: Never Used  . Alcohol use No     Comment: 11/12/2012 "no alcohol in the last 9 years"  . Drug use: No  . Sexual activity: Not Currently  Other Topics Concern  . None   Social History Narrative   Pt lives in [place] with spouse.  Retired     Family History: The patient's family history includes Heart disease in her brother; Hyperlipidemia in her father; Hypertension in her father and mother; Lung cancer in her mother.  ROS:   Please see the history of present illness.    ROS  All other systems reviewed and negative.   EKGs/Labs/Other Studies Reviewed:    The following studies were reviewed today: none  EKG:  EKG is not ordered today.    Recent Labs: 06/25/2016: ALT 20; BUN 16; Creatinine, Ser 0.80; Hemoglobin 12.8; Platelets 286; Potassium 3.9; Sodium 141 07/17/2016: TSH 7.337   Recent Lipid Panel    Component Value Date/Time   CHOL 201 (H) 06/25/2016 1011   TRIG 95 06/25/2016 1011   HDL 72 06/25/2016 1011   CHOLHDL 2.8 06/25/2016 1011   LDLCALC 110 (H) 06/25/2016 1011    Physical Exam:    VS:  BP 136/72   Pulse 70   Ht 5\' 7"  (1.702 m)   Wt 241 lb 6.4 oz (109.5 kg)   SpO2 96%   BMI 37.81 kg/m     Wt Readings from Last 3 Encounters:  01/14/17 241 lb 6.4 oz (109.5 kg)  12/11/16 239 lb (108.4 kg)  08/30/16 235 lb (106.6 kg)     GEN:  Well nourished, well developed in no acute distress HEENT: Normal NECK: No JVD; No carotid bruits LYMPHATICS: No lymphadenopathy CARDIAC: RRR, no murmurs, rubs,  gallops RESPIRATORY:  Clear to auscultation without rales, wheezing or rhonchi  ABDOMEN: Soft, non-tender, non-distended MUSCULOSKELETAL:  No edema; No deformity  SKIN: Warm and dry NEUROLOGIC:  Alert and oriented x 3 PSYCHIATRIC:  Normal affect   ASSESSMENT:    1. Persistent atrial fibrillation ([place])   2. Chronic diastolic CHF (congestive heart failure) ([place])   3. Nonrheumatic aortic valve stenosis   4. Essential hypertension   5. Ascending aorta dilatation (HCC)   6. Obstructive sleep apnea   7. Hyperlipidemia LDL goal <70    PLAN:    In order of problems listed above:  1.  Persistent atrial fibrillation - She is maintaining NSR on exam.  She will continue on Amio 200mg  daily, Cardizem CD 240mg  daily and Losartan 100mg  daily as well as warfarin.  She is up to date on all her testing for Amio.   2.  Chronic diastolic CHF - she appears euvolemic on exam today.  Her weight is up but I do not think it is due to fluid.  She will continue on lasix 80mg  daily.    3.  Severe AS s/p mechanical AVR - mean AV gradient 42mMhg on echo 06/2016 - continue warfarin - her INR is followed in our coumadin clinic.  - she has not been on ASA due to history of intracranial bleed on Coumadin in the past.  4.  HTN - BP is well controlled on exam today.  She will continue on Cardizem CD 240mg  daily and Losartan 100mg  daily.  5.  Ascending aortic root dilatation - 74mm by echo 06/2016 which is essentially unchanged from a year ago (34mm).  Chest CT angio a year ago was stable at 64mm.  She cannot get an MRI due to her mechanical AVR.  She will continue on statin.   6.  OSA - the patient is tolerating PAP therapy well without any problems.  The patient has been using and benefiting from  CPAP use and will continue to benefit from therapy. I will get a download from the DME.  7.  Hyperlipidemia - her last LDL was 110 and with her h/o aortic aneurysm I would like it < 70.  I will increase her pravastatin to  20mg  daily and repeat an FLP and ALT in 6 weeks.       Medication Adjustments/Labs and Tests Ordered: Current medicines are reviewed at length with the patient today.  Concerns regarding medicines are outlined above.  No orders of the defined types were placed in this encounter.  No orders of the defined types were placed in this encounter.   Signed, Fransico Him, MD  01/19/2017 3:45 PM    [place]

## 2017-01-19 ENCOUNTER — Encounter: Payer: Self-pay | Admitting: Cardiology

## 2017-01-19 DIAGNOSIS — E785 Hyperlipidemia, unspecified: Secondary | ICD-10-CM

## 2017-01-19 HISTORY — DX: Hyperlipidemia, unspecified: E78.5

## 2017-01-22 ENCOUNTER — Ambulatory Visit (INDEPENDENT_AMBULATORY_CARE_PROVIDER_SITE_OTHER): Payer: Medicare Other | Admitting: Internal Medicine

## 2017-01-22 ENCOUNTER — Encounter: Payer: Self-pay | Admitting: Internal Medicine

## 2017-01-22 VITALS — BP 144/86 | HR 74 | Ht 67.0 in | Wt 237.0 lb

## 2017-01-22 DIAGNOSIS — J45991 Cough variant asthma: Secondary | ICD-10-CM | POA: Diagnosis not present

## 2017-01-22 DIAGNOSIS — R0609 Other forms of dyspnea: Secondary | ICD-10-CM

## 2017-01-22 MED ORDER — BUDESONIDE-FORMOTEROL FUMARATE 80-4.5 MCG/ACT IN AERO: 2.0000 | INHALATION_SPRAY | Freq: Two times a day (BID) | RESPIRATORY_TRACT | 11 refills | Status: DC

## 2017-01-22 MED ORDER — BUDESONIDE-FORMOTEROL FUMARATE 80-4.5 MCG/ACT IN AERO: 2.0000 | INHALATION_SPRAY | Freq: Two times a day (BID) | RESPIRATORY_TRACT | 0 refills | Status: DC

## 2017-01-22 NOTE — Assessment & Plan Note (Signed)
PFT's  03/21/2016  FEV1 1.50 (58 % ) ratio 80  p 14 % improvement from saba p nothing prior to study with DLCO  65/66c % corrects to 94  % for alv volume   - FENO 04/03/2016  =   7  Allergy profile 04/03/2016 >  Eos 0.1 /  IgE  17 neg RAST - PFT's  06/01/2016  FEV1 1.57 (61 % ) ratio 84  p 22 % improvement from saba p nothing prior to study with DLCO  57/56 % corrects to 87  % for alv volume  - Sinus CT 06/08/2016 > No evidence of sinusitis - trial of dysmista 08/30/2016 > resolved and did not recur off rx as of 12/11/2016  - PFT's  12/11/2016  FEV1 1.59 (63 % ) ratio 80  p 30 % improvement from saba p nothing prior to study with DLCO  61/60 % corrects to 103  % for alv volume  With erv 9% and classic obst curvature  - 12/11/2016    Try symb 160 2bid > worse cough  - 01/22/2017  After extensive coaching HFA effectiveness =    75% from a baseline of 25% > try symb 80 2bid x 2 weeks then stop if no better    Although she def has evidence of reversible airflow obst, it may have nothing to do with ex intol or the cough that is now worse on the high dose ics with poor technique.  To sort this out try symb 80 using better hfa technique she showed today and if still coughing from it consider adding spacer.  I had an extended discussion with the patient reviewing all relevant studies completed to date and  lasting 15 to 20 minutes of a 25 minute visit    Each maintenance medication was reviewed in detail including most importantly the difference between maintenance and prns and under what circumstances the prns are to be triggered using an action plan format that is not reflected in the computer generated alphabetically organized AVS.    Please see AVS for specific instructions unique to this visit that I personally wrote and verbalized to the the pt in detail and then reviewed with pt  by my nurse highlighting any  changes in therapy recommended at today's visit to their plan of care.

## 2017-01-22 NOTE — Patient Instructions (Signed)
Symbicort 80  Take 2 puffs first thing in am and then another 2 puffs about 12 hours later.   Work on inhaler technique:  relax and gently blow all the way out then take a nice smooth deep breath back in, triggering the inhaler at same time you start breathing in.  Hold for up to 5 seconds if you can. Blow out thru nose. Rinse and gargle with water when done  If walking the dogs up hills is not significantly eaesier then don't the presription    Please schedule a follow up visit in 3 months but call sooner if needed with a 6 min walk on return

## 2017-01-22 NOTE — Assessment & Plan Note (Signed)
ERV 9% by pfts 12/11/2016   Body mass index is 37.12 kg/m.  -  trending down/ encouraged  Lab Results  Component Value Date   TSH 7.337 (H) 07/17/2016     Contributing to gerd risk/ doe/reviewed the need and the process to achieve and maintain neg calorie balance > defer f/u primary care including intermittently monitoring thyroid status

## 2017-01-22 NOTE — Progress Notes (Signed)
Subjective:    Patient ID: [name], female   DOB: [birth year],   MRN: [id number]    Brief patient profile:  71yowf never smoker  s/p AVR in 2006 then s/p LKR July 2016 and since then in and out of Afib assoc with sob / fatigue can last a few secs - days then feel fine in between referred to pulmonary clinic 04/03/2016 by Dr   Rayann Heman with abn pfts with h/o amiodarone exposure first in 2014 and restarted 03/19/16 with pfts c/w reversible airflow obst 12/11/2016     History of Present Illness  04/03/2016 1st [facility] office visit/ [facility]   Chief Complaint  Patient presents with  . Pulmonary Consult    Dr. Rayann Heman sent pt here, PFT showed advanced lung disease, pt states is fatigued and SOB  on best best days can walk the neighborhood x 7-8 min includes some hills for the first time as previously lived in a flatter neighborhood and ever since new walk has noted more sob, but only on the hills Sleeps with cpap feeling fine/ [name] Each am wakes up with cough productive mucus is yellowy green x "a while" =  months / Not years assoc with nasal congestion but no wheezing  rec Augmentin 875 mg take one pill twice daily  X 10 days  Need to let the coumadin clinic know you are on this medication [place] nasal spray as much as you want     06/01/2016  f/u ov/Wert re: still on amiodarone Chief Complaint  Patient presents with  . Follow-up    PFT's done today. Breathing is "fine" has good days and bad days. She c/o nasal congestion recently.   can walk 10 min around neighborhood x some steep hills slow her down but never has to stop  No more bad days since first of the year / feels like afib better and that's what is making the difference  Main concern is variable nasal congestion and sense of pnds x months, worse at hs and in am assoc with cough Has tried otcs no better rec CT sinus 06/08/16 > neg / allergy profile neg 04/03/16     08/30/2016  f/u ov/Wert re: amiodarone  surveillance/ rhinitis with tendency to uacs  Chief Complaint  Patient presents with  . Follow-up    Pt c/o constant nasal congestion. She also c/o non prod cough for the past several wks.    ex tol varies with no pattern and on best days can still do ex fine  Nasal congestion started end of  2017 / better with afrin worse at hs but thinks becoming addicted to it  rec Dymista one twice daily until use up sample then if better       12/11/2016  f/u ov/Wert re:   Asthma/ rhinitis neg allergy eval  Chief Complaint  Patient presents with  . Follow-up    PFT done today, still SOB with exertion  continues with variable ex tol s specific pattern she is aware of  Nasal symptoms resolved p took dymista and did not refill and they have not recurred s need for otcs  Has never used saba before   rec Try symicort 160 Take 2 puffs first thing in am and then another 2 puffs about 12 hours later.  Work on inhaler technique:  relax and gently blow all the way out then take a nice smooth deep breath back in, triggering the inhaler at same time you start breathing in.  Hold for up to 5 seconds if you can. Blow out thru nose. Rinse and gargle with water when done   01/22/2017  f/u ov/Wert re:  Doe on amio/ no better on symb 160 and now coughing  Chief Complaint  Patient presents with  . Follow-up    She states Symbicort made her cough more and so she has stopped taking med every day. She was given an albuterol inhaler by her PCP and uses this maybe once per wk on average.   walks dogs x 20 min with fine flat surface but some sob on hills for about about a min and no better on symb but hfa poor housework also makes her sob / saba no better  tsh high and thyroid being ajusted up by pcp  Coughs non productive only p symbicort 160 and did not cough like this prev/ no assoc pnds as before    No obvious day to day or daytime variability or assoc excess/ purulent sputum or mucus plugs or hemoptysis or cp or  chest tightness, subjective wheeze or overt sinus or hb symptoms. No unusual exp hx or h/o childhood pna/ asthma or knowledge of premature birth.  Sleeping ok flat without nocturnal  or early am exacerbation  of respiratory  c/o's or need for noct saba. Also denies any obvious fluctuation of symptoms with weather or environmental changes or other aggravating or alleviating factors except as outlined above   Current Allergies, Complete Past Medical History, Past Surgical History, Family History, and Social History were reviewed in [employer] record.  ROS  The following are not active complaints unless bolded Hoarseness, sore throat, dysphagia, dental problems, itching, sneezing,  nasal congestion or discharge of excess mucus or purulent secretions, ear ache,   fever, chills, sweats, unintended wt loss or wt gain, classically pleuritic or exertional cp,  orthopnea pnd or leg swelling, presyncope, palpitations, abdominal pain, anorexia, nausea, vomiting, diarrhea  or change in bowel habits or change in bladder habits, change in stools or change in urine, dysuria, hematuria,  rash, arthralgias, visual complaints, headache, numbness, weakness or ataxia or problems with walking or coordination,  change in mood/affect or memory.        Current Meds  Medication Sig  . ALPRAZolam (XANAX) 0.5 MG tablet Take 0.5 mg by mouth 2 (two) times daily as needed. AS NEEDED FOR ANXIETY  . amiodarone (PACERONE) 200 MG tablet Take 1 tablet (200 mg total) by mouth daily.  . Ascorbic Acid (VITAMIN C) 1000 MG tablet Take 2,000 mg by mouth daily.  . calcium-vitamin D (OSCAL WITH D) 500-200 MG-UNIT per tablet Take 2 tablets by mouth daily.   . Cholecalciferol (VITAMIN D) 2000 UNITS tablet Take 4,000 Units by mouth daily.  . diazepam (VALIUM) 10 MG tablet Take 10 mg by mouth every 6 (six) hours as needed for anxiety.  Marland Kitchen DILT-XR 240 MG 24 hr capsule TAKE 1 CAPSULE BY MOUTH EVERY MORNING ON AN EMPTY  STOMACH  . DULoxetine (CYMBALTA) 60 MG capsule Take 60 mg by mouth daily.  . furosemide (LASIX) 80 MG tablet TAKE 1 TABLET BY MOUTH EVERY DAY  . levothyroxine (SYNTHROID, LEVOTHROID) 75 MCG tablet Take 75 mcg by mouth daily.  Marland Kitchen losartan (COZAAR) 100 MG tablet Take 100 mg by mouth daily.  . Multiple Vitamin (MULTIVITAMIN WITH MINERALS) TABS Take 1 tablet by mouth daily.  . potassium chloride SA (K-DUR,KLOR-CON) 20 MEQ tablet TAKE 2 TABLETS BY MOUTH EVERY MORNING AND 1 TABLET EVERY EVENING  .  pravastatin (PRAVACHOL) 10 MG tablet Take 10 mg by mouth daily.  Marland Kitchen tretinoin (RETIN-A) 0.05 % cream Apply 1 application topically at bedtime.   . VENTOLIN HFA 108 (90 Base) MCG/ACT inhaler Inhale 2 puffs into the lungs every 6 (six) hours as needed.  . warfarin (COUMADIN) 4 MG tablet TAKE AS DIRECTED PER COUMADIN CLINIC  . zolpidem (AMBIEN) 10 MG tablet Take 5 mg by mouth at bedtime as needed for sleep.  . [  budesonide-formoterol (SYMBICORT) 160-4.5 MCG/ACT inhaler Inhale 2 puffs into the lungs 2 (two) times daily.                Objective:   Physical Exam  amb wf nad    01/22/2017     237  12/11/2016         239  08/30/2016       235  06/01/2016       235   04/03/16 233 lb 6.4 oz (105.9 kg)  03/19/16 240 lb 12.8 oz (109.2 kg)  12/28/15 229 lb 6.4 oz (104.1 kg)    Vital signs reviewed  - Note on arrival 02 sats 97 % RA   HEENT: nl dentition,  and oropharynx. Nl external ear canals without cough reflex -  Mild   bilateral non-specific turbinate edema  s purulence  And no pnd viz   NECK :  without JVD/Nodes/TM/ nl carotid upstrokes bilaterally   LUNGS: no acc muscle use,  Nl contour chest which is clear to A and P bilaterally without cough on insp or exp maneuvers   CV:  RRR mechanical S1  no s3 or murmur or increase in P2, nad no edema   ABD:  soft and nontender with nl inspiratory excursion in the supine position. No bruits or organomegaly appreciated, bowel sounds nl  MS:  Nl gait/ ext  warm without deformities, calf tenderness, cyanosis or clubbing No obvious joint restrictions   SKIN: warm and dry without lesions    NEURO:  alert, approp, nl sensorium with  no motor or cerebellar deficits apparent.            Assessment:

## 2017-01-22 NOTE — Assessment & Plan Note (Signed)
Amiodarone restarted 03/19/16  PFT's  03/21/2016  FEV1 1.50 (58 % ) ratio 80  p 14 % improvement from saba p nothing prior to study with DLCO  65/66c % corrects to 94  % for alv volume   - PFT's  06/01/2016  FEV1 1.57 (61 % ) ratio 84  p 22 % improvement from saba p nothing prior to study with DLCO  57/56 % corrects to 87  % for alv volume  - 08/30/2016  Walked RA x 3 laps @ 185 ft each stopped due to  End of study, fast pace, no sob or desat   - PFT's 12/11/2016  No change in dlco     No evidence of amio toxicity  Patients typically have been on amiodarone for 6-12 months before this complication manifests.  Of note, serial clinical evaluation for symptoms such as cough dyspnea or fevers is  the preferred method of monitoring for pulmonary toxicity because a decrease in DLCO or lung volumes is a nonspecific for toxicity. Pathologically amiodarone pulmonary toxicity may appear as interstitial pneumonitis, eosinophilic pneumonia, organizing pneumonia, pulmonary fibrosis or less commonly as diffuse alveolar hemorrhage, pulmonary nodules or pleural effusions.  Risk factors for pulmonary toxicity include age greater than 36, daily dose greater than equal to 400 mg, a high cumulative dose, or pre-existing lung disease   rec daily walking and f/u with 6 m walk q 3 m

## 2017-01-29 ENCOUNTER — Ambulatory Visit (INDEPENDENT_AMBULATORY_CARE_PROVIDER_SITE_OTHER): Payer: Medicare Other | Admitting: Pharmacist

## 2017-01-29 DIAGNOSIS — I4819 Other persistent atrial fibrillation: Secondary | ICD-10-CM

## 2017-01-29 DIAGNOSIS — Z5181 Encounter for therapeutic drug level monitoring: Secondary | ICD-10-CM | POA: Diagnosis not present

## 2017-01-29 DIAGNOSIS — I481 Persistent atrial fibrillation: Secondary | ICD-10-CM | POA: Diagnosis not present

## 2017-01-29 LAB — POCT INR: INR: 1.9

## 2017-02-08 ENCOUNTER — Other Ambulatory Visit: Payer: Self-pay | Admitting: Cardiology

## 2017-02-11 DIAGNOSIS — E559 Vitamin D deficiency, unspecified: Secondary | ICD-10-CM | POA: Diagnosis not present

## 2017-02-11 DIAGNOSIS — E039 Hypothyroidism, unspecified: Secondary | ICD-10-CM | POA: Diagnosis not present

## 2017-02-12 ENCOUNTER — Other Ambulatory Visit: Payer: Self-pay | Admitting: Gastroenterology

## 2017-02-12 DIAGNOSIS — Z800 Family history of malignant neoplasm of digestive organs: Secondary | ICD-10-CM

## 2017-02-12 DIAGNOSIS — Z1211 Encounter for screening for malignant neoplasm of colon: Secondary | ICD-10-CM

## 2017-02-19 ENCOUNTER — Ambulatory Visit (INDEPENDENT_AMBULATORY_CARE_PROVIDER_SITE_OTHER): Payer: Medicare Other | Admitting: *Deleted

## 2017-02-19 DIAGNOSIS — I4819 Other persistent atrial fibrillation: Secondary | ICD-10-CM

## 2017-02-19 DIAGNOSIS — Z5181 Encounter for therapeutic drug level monitoring: Secondary | ICD-10-CM

## 2017-02-19 DIAGNOSIS — I481 Persistent atrial fibrillation: Secondary | ICD-10-CM

## 2017-02-19 LAB — POCT INR: INR: 3.5

## 2017-02-19 NOTE — Patient Instructions (Signed)
Skip today's dose, then  continue taking 1 tablet daily except 1.5 tablets on Wednesdays and Saturdays. Recheck INR in 2 weeks. Keep intake of greens consistent.

## 2017-03-05 ENCOUNTER — Ambulatory Visit (INDEPENDENT_AMBULATORY_CARE_PROVIDER_SITE_OTHER): Payer: Medicare Other | Admitting: Pharmacist

## 2017-03-05 DIAGNOSIS — I4819 Other persistent atrial fibrillation: Secondary | ICD-10-CM

## 2017-03-05 DIAGNOSIS — I481 Persistent atrial fibrillation: Secondary | ICD-10-CM

## 2017-03-05 DIAGNOSIS — Z5181 Encounter for therapeutic drug level monitoring: Secondary | ICD-10-CM | POA: Diagnosis not present

## 2017-03-05 LAB — POCT INR: INR: 3.2

## 2017-03-05 NOTE — Patient Instructions (Signed)
Skip today's dose, then  Change dose to 1 tablet daily except 1.5 tablets on Wednesdays. Recheck INR in 2 weeks. Keep intake of greens consistent.

## 2017-03-11 ENCOUNTER — Other Ambulatory Visit: Payer: Self-pay | Admitting: *Deleted

## 2017-03-11 MED ORDER — LOSARTAN POTASSIUM 100 MG PO TABS: 100.0000 mg | ORAL_TABLET | Freq: Every day | ORAL | 2 refills | Status: DC

## 2017-03-26 ENCOUNTER — Ambulatory Visit (INDEPENDENT_AMBULATORY_CARE_PROVIDER_SITE_OTHER): Payer: Medicare Other | Admitting: *Deleted

## 2017-03-26 DIAGNOSIS — Z952 Presence of prosthetic heart valve: Secondary | ICD-10-CM | POA: Diagnosis not present

## 2017-03-26 DIAGNOSIS — I481 Persistent atrial fibrillation: Secondary | ICD-10-CM | POA: Diagnosis not present

## 2017-03-26 DIAGNOSIS — Z5181 Encounter for therapeutic drug level monitoring: Secondary | ICD-10-CM | POA: Diagnosis not present

## 2017-03-26 DIAGNOSIS — I4819 Other persistent atrial fibrillation: Secondary | ICD-10-CM

## 2017-03-26 LAB — POCT INR: INR: 2.3

## 2017-03-26 NOTE — Patient Instructions (Signed)
Description   Continue same dose of coumadin  1 tablet daily except 1.5 tablets on Wednesdays. Recheck INR in 3 weeks. Keep intake of greens consistent.

## 2017-04-08 ENCOUNTER — Other Ambulatory Visit: Payer: Self-pay | Admitting: Internal Medicine

## 2017-04-15 ENCOUNTER — Other Ambulatory Visit: Payer: Self-pay | Admitting: Cardiology

## 2017-04-15 ENCOUNTER — Ambulatory Visit (INDEPENDENT_AMBULATORY_CARE_PROVIDER_SITE_OTHER): Payer: Medicare Other | Admitting: *Deleted

## 2017-04-15 DIAGNOSIS — Z5181 Encounter for therapeutic drug level monitoring: Secondary | ICD-10-CM | POA: Diagnosis not present

## 2017-04-15 DIAGNOSIS — I4819 Other persistent atrial fibrillation: Secondary | ICD-10-CM

## 2017-04-15 DIAGNOSIS — I481 Persistent atrial fibrillation: Secondary | ICD-10-CM | POA: Diagnosis not present

## 2017-04-15 DIAGNOSIS — Z952 Presence of prosthetic heart valve: Secondary | ICD-10-CM | POA: Diagnosis not present

## 2017-04-15 LAB — POCT INR: INR: 2.2

## 2017-04-15 NOTE — Patient Instructions (Signed)
Description   Continue same dose of coumadin  1 tablet daily except 1.5 tablets on Wednesdays. Recheck INR in 4 weeks. Keep intake of greens consistent.

## 2017-04-25 ENCOUNTER — Ambulatory Visit (INDEPENDENT_AMBULATORY_CARE_PROVIDER_SITE_OTHER): Payer: Medicare Other | Admitting: Internal Medicine

## 2017-04-25 ENCOUNTER — Ambulatory Visit (INDEPENDENT_AMBULATORY_CARE_PROVIDER_SITE_OTHER): Payer: Medicare Other | Admitting: *Deleted

## 2017-04-25 ENCOUNTER — Encounter: Payer: Self-pay | Admitting: Internal Medicine

## 2017-04-25 VITALS — BP 134/80 | HR 72 | Ht 67.0 in | Wt 231.0 lb

## 2017-04-25 DIAGNOSIS — J45991 Cough variant asthma: Secondary | ICD-10-CM | POA: Diagnosis not present

## 2017-04-25 DIAGNOSIS — R0609 Other forms of dyspnea: Secondary | ICD-10-CM

## 2017-04-25 MED ORDER — MOMETASONE FURO-FORMOTEROL FUM 100-5 MCG/ACT IN AERO: 2.0000 | INHALATION_SPRAY | Freq: Two times a day (BID) | RESPIRATORY_TRACT | 0 refills | Status: DC

## 2017-04-25 NOTE — Progress Notes (Signed)
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 Pulmonary Consult    Dr. Rayann Heman sent pt here, PFT showed advanced lung disease, pt states is fatigued and SOB  on best best days can walk the neighborhood x 7-8 min includes some hills for the first time as previously lived in a flatter neighborhood and ever since new walk has noted more sob, but only on the hills Sleeps with cpap feeling fine/ [name] Each am wakes up with cough productive mucus is yellowy green x "a while" =  months / Not years assoc with nasal congestion but no wheezing  rec Augmentin 875 mg take one pill twice daily  X 10 days  Need to let the coumadin clinic know you are on this medication Ocean nasal spray as much as you want     06/01/2016  f/u ov/[name] re: still on amiodarone Chief Complaint  Patient presents with  . Follow-up    PFT's done today. Breathing is "fine" has good days and bad days. She c/o nasal congestion recently.   can walk 10 min around neighborhood x some steep hills slow her down but never has to stop  No more bad days since first of the year / feels like afib better and that's what is making the difference  Main concern is variable nasal congestion and sense of pnds x months, worse at hs and in am assoc with cough Has tried otcs no better rec CT sinus 06/08/16 > neg / allergy profile neg 04/03/16     08/30/2016  f/u ov/[name] re: amiodarone  surveillance/ rhinitis with tendency to uacs  Chief Complaint  Patient presents with  . Follow-up    Pt c/o constant nasal congestion. She also c/o non prod cough for the past several wks.    ex tol varies with no pattern and on best days can still do ex fine  Nasal congestion started end of  2017 / better with afrin worse at hs but thinks becoming addicted to it  rec Dymista one twice daily until use up sample then if better       12/11/2016  f/u ov/[name] re:   Asthma/ rhinitis neg allergy eval  Chief Complaint  Patient presents with  . Follow-up    PFT done today, still SOB with exertion  continues with variable ex tol s specific pattern she is aware of  Nasal symptoms resolved p took dymista and did not refill and they have not recurred s need for otcs  Has never used saba before   rec Try symicort 160 Take 2 puffs first thing in am and then another 2 puffs about 12 hours later.  Work on inhaler technique:  relax and gently blow all the way out then take a nice smooth deep breath back in, triggering the inhaler at same time you start breathing in.  Hold for up to 5 seconds if you can. Blow out thru nose. Rinse and gargle with water when done   01/22/2017  f/u ov/[name] re:  Doe on amio/ no better on symb 160 and now coughing  Chief Complaint  Patient presents with  . Follow-up    She states Symbicort made her cough more and so she has stopped taking med every day. She was given an albuterol inhaler by her PCP and uses this maybe once per wk on average.   walks dogs x 20 min with fine flat surface but some sob on hills for about about a min and no better on symb but hfa poor housework also makes her sob / saba no better  tsh high and thyroid being ajusted up by pcp  Coughs non productive only p symbicort 160 and did not cough like this prev/ no assoc pnds as before  rec Symbicort 80  Take 2 puffs first thing in am and then another 2 puffs about 12 hours later.  Work on inhaler  technique:      04/25/2017  f/u ov/[name] re:  Doe on amio / poor hfa / 02 and cpap per Dr Radford Pax Chief Complaint  Patient presents with  . Follow-up    6MW test done today. She is not coughing any less and states her breathing is unchanged. She states she does not have an albuterol inhaler.   still sob with vacuuming/ not sure syb 80 helping but hfa still poor   No obvious day to day or daytime variability or assoc excess/ purulent sputum or mucus plugs or hemoptysis or cp or chest tightness, subjective wheeze or overt sinus or hb symptoms. No unusual exposure hx or h/o childhood pna/ asthma or knowledge of premature birth.  Sleeping ok on cpap/02  without nocturnal  or early am exacerbation  of respiratory  c/o's or need for noct saba. Also denies any obvious fluctuation of symptoms with weather or environmental changes or other aggravating or alleviating factors except as outlined above   Current Allergies, Complete Past Medical History, Past Surgical History, Family History, and Social History were reviewed in [employer] record.  ROS  The following are not active complaints unless bolded Hoarseness, sore throat, dysphagia, dental problems, itching, sneezing,  nasal congestion or discharge of excess mucus or purulent secretions, ear ache,   fever, chills, sweats, unintended wt loss or wt gain, classically pleuritic or exertional cp,  orthopnea pnd or leg swelling, presyncope, palpitations, abdominal pain, anorexia, nausea, vomiting, diarrhea  or change in bowel habits or change in bladder habits, change in stools or change in urine, dysuria, hematuria,  rash, arthralgias, visual complaints, headache, numbness, weakness or ataxia or problems with walking or coordination,  change in mood/affect or memory.        Current Meds  Medication Sig  . ALPRAZolam (XANAX) 0.5 MG tablet Take 0.5 mg by mouth 2 (two) times daily as needed. AS NEEDED FOR ANXIETY  . amiodarone  (PACERONE) 200 MG tablet TAKE 1 TABLET BY MOUTH DAILY  . Ascorbic Acid (VITAMIN C) 1000 MG tablet Take 2,000 mg by mouth daily.  . budesonide-formoterol (SYMBICORT) 80-4.5 MCG/ACT inhaler Inhale 2 puffs into the lungs 2 (two) times daily.  . calcium-vitamin D (OSCAL WITH D) 500-200 MG-UNIT per tablet Take 2 tablets by mouth daily.   . Cholecalciferol (VITAMIN D) 2000 UNITS tablet Take 4,000 Units by mouth daily.  . diazepam (VALIUM) 10 MG tablet Take 10 mg by  mouth every 6 (six) hours as needed for anxiety.  Marland Kitchen DILT-XR 240 MG 24 hr capsule TAKE 1 CAPSULE BY MOUTH EVERY MORNING ON AN EMPTY STOMACH  . DULoxetine (CYMBALTA) 60 MG capsule Take 60 mg by mouth daily.  . furosemide (LASIX) 80 MG tablet TAKE 1 TABLET BY MOUTH EVERY DAY  . levothyroxine (SYNTHROID, LEVOTHROID) 100 MCG tablet Take 100 mcg daily before breakfast by mouth.  . losartan (COZAAR) 100 MG tablet Take 1 tablet (100 mg total) by mouth daily.  . Multiple Vitamin (MULTIVITAMIN WITH MINERALS) TABS Take 1 tablet by mouth daily.  . potassium chloride SA (K-DUR,KLOR-CON) 20 MEQ tablet TAKE 2 TABLETS BY MOUTH EVERY MORNING AND 1 TABLET EVERY EVENING  . pravastatin (PRAVACHOL) 10 MG tablet Take 10 mg by mouth daily.  Marland Kitchen tretinoin (RETIN-A) 0.05 % cream Apply 1 application topically at bedtime.   Marland Kitchen warfarin (COUMADIN) 4 MG tablet TAKE AS DIRECTED PER COUMADIN CLINIC.  Marland Kitchen zolpidem (AMBIEN) 10 MG tablet Take 5 mg by mouth at bedtime as needed for sleep.                Objective:   Physical Exam  Pleasant amb wf nad  04/25/2017       231  01/22/2017     237  12/11/2016         239  08/30/2016       235  06/01/2016       235   04/03/16 233 lb 6.4 oz (105.9 kg)  03/19/16 240 lb 12.8 oz (109.2 kg)  12/28/15 229 lb 6.4 oz (104.1 kg)     Vital signs reviewed - Note on arrival 02 sats  96% on RA      HEENT: nl dentition , and oropharynx. Nl external ear canals without cough reflex - mild L >R  bilateral non-specific turbinate edema      NECK :  without JVD/Nodes/TM/ nl carotid upstrokes bilaterally   LUNGS: no acc muscle use,  Nl contour chest which is clear to A and P bilaterally without cough on insp or exp maneuvers   CV:  RRR  no s3 or murmur or increase in P2, and no edema   ABD:  Quite obese but soft and nontender with nl inspiratory excursion in the supine position. No bruits or organomegaly appreciated, bowel sounds nl  MS:  Nl gait/ ext warm without deformities, calf tenderness, cyanosis or clubbing No obvious joint restrictions   SKIN: warm and dry without lesions    NEURO:  alert, approp, nl sensorium with  no motor or cerebellar deficits apparent.              Assessment:

## 2017-04-25 NOTE — Progress Notes (Signed)
SIX MIN WALK 04/25/2017 08/30/2016  Medications No meds taken -  Supplimental Oxygen during Test? (L/min) No No  Laps 8 -  Partial Lap (in Meters) 46 -  Baseline BP (sitting) 132/80 -  Baseline Heartrate 69 -  Baseline Dyspnea (Borg Scale) 0 -  Baseline Fatigue (Borg Scale) 1 -  Baseline SPO2 97 -  BP (sitting) 160/90 -  Heartrate 84 -  Dyspnea (Borg Scale) 3 -  Fatigue (Borg Scale) 2 -  SPO2 96 -  BP (sitting) 150/84 -  Heartrate 77 -  SPO2 98 -  Stopped or Paused before Six Minutes No -  Interpretation (No Data) -  Distance Completed 430 -  Tech Comments: Pt walked at a normal pace during walk and denied any complaints while walking fast pace/minimal SOB//lmr

## 2017-04-25 NOTE — Patient Instructions (Addendum)
Monitor your 02 saturation toward the end of your exertion  Work on inhaler technique:  relax and gently blow all the way out then take a nice smooth deep breath back in, triggering the inhaler at same time you start breathing in.  Hold for up to 5 seconds if you can. Blow out thru nose. Rinse and gargle with water when done  Take the dulera 100 = symb 80 Take 2 puffs first thing in am and then another 2 puffs about 12 hours later and they try off to see if any difference in either your cough or breathing and if either is worse start back on it   Please schedule a follow up visit in 6 months but call sooner if needed  with all medications /inhalers/ solutions in hand so we can verify exactly what you are taking. This includes all medications from all doctors and over the counters

## 2017-04-25 NOTE — Assessment & Plan Note (Signed)
Amiodarone restarted 03/19/16  PFT's  03/21/2016  FEV1 1.50 (58 % ) ratio 80  p 14 % improvement from saba p nothing prior to study with DLCO  65/66c % corrects to 94  % for alv volume   - PFT's  06/01/2016  FEV1 1.57 (61 % ) ratio 84  p 22 % improvement from saba p nothing prior to study with DLCO  57/56 % corrects to 87  % for alv volume  - 08/30/2016  Walked RA x 3 laps @ 185 ft each stopped due to  End of study, fast pace, no sob or desat   - PFT's 12/11/2016  No change in dlco   - 6 mw 04/25/2017  = 430 m / no desats   No evidence amio toxicity/ rec she monitor her sats toward the end of ex and report asap if dropping   see avs for instructions unique to this ov

## 2017-04-25 NOTE — Assessment & Plan Note (Signed)
ERV 9% by pfts 12/11/2016   Body mass index is 36.18 kg/m.  -  trending down/ encourage  Lab Results  Component Value Date   TSH 7.337 (H) 07/17/2016     Contributing to gerd risk/ doe/reviewed the need and the process to achieve and maintain neg calorie balance > defer f/u primary care including intermittently monitoring thyroid status

## 2017-04-25 NOTE — Assessment & Plan Note (Signed)
PFT's  03/21/2016  FEV1 1.50 (58 % ) ratio 80  p 14 % improvement from saba p nothing prior to study with DLCO  65/66c % corrects to 94  % for alv volume   - FENO 04/03/2016  =   7  Allergy profile 04/03/2016 >  Eos 0.1 /  IgE  17 neg RAST - PFT's  06/01/2016  FEV1 1.57 (61 % ) ratio 84  p 22 % improvement from saba p nothing prior to study with DLCO  57/56 % corrects to 87  % for alv volume  - Sinus CT 06/08/2016 > No evidence of sinusitis - trial of dysmista 08/30/2016 > resolved and did not recur off rx as of 12/11/2016  - PFT's  12/11/2016  FEV1 1.59 (63 % ) ratio 80  p 30 % improvement from saba p nothing prior to study with DLCO  61/60 % corrects to 103  % for alv volume  With erv 9% and classic obst curvature  - 12/11/2016    Try symb 160 2bid > worse cough  - 01/22/2017   try symb 80 2bid x 2 weeks> no change  - 04/25/2017  After extensive coaching inhaler device  effectiveness =    75% from a baseline of 25% so rec rechallenge with symb 80/dulera 100 2bid x one week then back off again    Not sure she has asthma at all but if so it is very mild . Based on the study from NEJM  378; 37 p 1865 (2018) in pts with mild asthma it is reasonable to use low dose symbicort eg 80 (or dulera 100) 2bid "prn" flare in this setting but I emphasized this was only shown with symbicort and takes advantage of the rapid onset of action but is not the same as "rescue therapy" but can be stopped once the acute symptoms have resolved and the need for rescue has been minimized (< 2 x weekly)  If not convinced this is helping next step is aggressive rx for pnds/ gerd and then MCT    I had an extended discussion with the patient reviewing all relevant studies completed to date and  lasting 15 to 20 minutes of a 25 minute visit    Each maintenance medication was reviewed in detail including most importantly the difference between maintenance and prns and under what circumstances the prns are to be triggered using an action  plan format that is not reflected in the computer generated alphabetically organized AVS.    Please see AVS for specific instructions unique to this visit that I personally wrote and verbalized to the the pt in detail and then reviewed with pt  by my nurse highlighting any  changes in therapy recommended at today's visit to their plan of care.

## 2017-05-02 ENCOUNTER — Telehealth: Payer: Self-pay | Admitting: Cardiology

## 2017-05-02 NOTE — Telephone Encounter (Signed)
She is cleared from a cardiac standpoint for dental procedure. I believe she will need Lovenox bridging and SBE prophylaxis, I will route to pharmacy for recommendations.   She had normal coronaries in 2005 and a negative Myoview in 2017. She saw Dr Radford Pax in Oct 2018 and was doing well and I spoke with the pt today and she confirmed this.    Kerin Ransom PA-C  05/02/2017 4:37 PM

## 2017-05-02 NOTE — Telephone Encounter (Signed)
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    Procedure: Dental inplant Date of procedure: TBD  CHADS2-VASc score of 6  (CHF, HTN, AGE, DM2, CAD, female) with [employer] Jude aortic valve and history of intracranial bleed in 2006  Per office protocol, patient can hold warfarin for 5 days prior to procedure.  Patient will need bridging with Lovenox (enoxaparin) around procedure.  But due to history of intracranial bleed with INR of 2.4; will forward to cardiologist for review.   *Amoxicillin 2 grams 30-60 minutes needed prior to procedure*  Jaydee Conran Rodriguez-Guzman PharmD, BCPS, CPP [place] [id number] 05/02/2017 5:08 PM

## 2017-05-02 NOTE — Telephone Encounter (Signed)
New Message  1. What dental office are you calling from? [facility]   2. What is your office phone and fax number? [phone] and fax number [phone]  3. What type of procedure is the patient having performed?  Dental implant and bone grafting  4. What date is procedure scheduled or is the patient there now? none  5. What is your question (ex. Antibiotics prior to procedure, holding medication-we need to know how long dentist wants pt to hold med)? Wants to know if patient can come off the coumdain and what effects will local anesthesia have.

## 2017-05-02 NOTE — Telephone Encounter (Signed)
Megan,  Please find out if oral surgeon actually needs anticoagulation held for a dental implant

## 2017-05-03 NOTE — Telephone Encounter (Signed)
[facility] to determine if need to hold for dental implant. Dental office is closed on Fridays. Will try again on Monday.

## 2017-05-06 NOTE — Telephone Encounter (Signed)
LMOM at [facility] to call back about need to hold anticoagulation. If no response will try again later today.

## 2017-05-07 DIAGNOSIS — E039 Hypothyroidism, unspecified: Secondary | ICD-10-CM | POA: Diagnosis not present

## 2017-05-08 NOTE — Telephone Encounter (Signed)
[name] returned call from [facility] - they will refer her to an oral surgeon who would be more comfortable performing pt's bone graft and dental implant without stopping her warfarin. Pt will still require amoxicillin 2g 1 hour prior to procedure due to history of mechanical valve.  Faxed to [facility] per [facility] request so that they can forward clearance to oral surgeon. Fax # [phone].

## 2017-05-13 ENCOUNTER — Ambulatory Visit (INDEPENDENT_AMBULATORY_CARE_PROVIDER_SITE_OTHER): Payer: Medicare Other | Admitting: *Deleted

## 2017-05-13 DIAGNOSIS — Z5181 Encounter for therapeutic drug level monitoring: Secondary | ICD-10-CM

## 2017-05-13 DIAGNOSIS — I481 Persistent atrial fibrillation: Secondary | ICD-10-CM | POA: Diagnosis not present

## 2017-05-13 DIAGNOSIS — I4819 Other persistent atrial fibrillation: Secondary | ICD-10-CM

## 2017-05-13 LAB — POCT INR: INR: 2.9

## 2017-05-13 NOTE — Patient Instructions (Signed)
Description   Today take 1/2 tablet then continue same dose of coumadin 1 tablet daily except 1.5 tablets on Wednesdays. Recheck INR in 3 weeks. Keep intake of greens consistent.

## 2017-05-16 DIAGNOSIS — H1849 Other corneal degeneration: Secondary | ICD-10-CM | POA: Diagnosis not present

## 2017-05-18 ENCOUNTER — Ambulatory Visit (HOSPITAL_COMMUNITY)
Admission: EM | Admit: 2017-05-18 | Discharge: 2017-05-18 | Disposition: A | Discharge status: Home / Self Care | Payer: Medicare Other | Attending: Family Medicine | Admitting: Family Medicine

## 2017-05-18 ENCOUNTER — Encounter (HOSPITAL_COMMUNITY): Payer: Self-pay | Admitting: Emergency Medicine

## 2017-05-18 DIAGNOSIS — B9789 Other viral agents as the cause of diseases classified elsewhere: Secondary | ICD-10-CM | POA: Diagnosis not present

## 2017-05-18 DIAGNOSIS — J069 Acute upper respiratory infection, unspecified: Secondary | ICD-10-CM

## 2017-05-18 MED ORDER — BENZONATATE 100 MG PO CAPS: 100.0000 mg | ORAL_CAPSULE | Freq: Three times a day (TID) | ORAL | 0 refills | Status: DC | PRN

## 2017-05-18 NOTE — ED Provider Notes (Signed)
[facility]    CSN: [id number] Arrival date & time: 05/18/17  1200     History   Chief Complaint Chief Complaint  Patient presents with  . URI    HPI [name] is a 72 y.o. female.   HPI  Duration: 1 day  Associated symptoms: cough Denies: sinus congestion, sinus pain, rhinorrhea, itchy watery eyes, ear pain, ear drainage, sore throat, wheezing, shortness of breath, myalgia and fevers Treatment to date: Tylenol, cough drops Sick contacts: No    Past Medical History:  Diagnosis Date  . Anxiety   . Aortic stenosis    status post aortic valve replacement with St. Jude mechanical prosthesis  . 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   68mm by echo 06/2016 and 27mm by CT angin 2016  . Chronic anticoagulation   . 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 Depression   . DJD (degenerative joint disease) of knee   . Dyslipidemia   . GERD (gastroesophageal reflux disease)    "several years ago; none since" (11/12/2012)  . Hyperlipidemia   . Hyperlipidemia LDL goal <70 01/19/2017  . Hypertension   . Left atrial enlargement   . Mitral regurgitation 06/25/2016   Mild moderate MR by echo 06/2016  . Obesity   . Persistent atrial fibrillation (HCC)    s/p afib ablation x 2 at [facility]  . Sleep apnea    On CPAP at 12cm H2O  . 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 Hyperlipidemia LDL goal <70 01/19/2017  . Morbid obesity due to excess calories ([place]) complicated by low ERV on pfts/ hbp/hyperlipidemia 09/02/2016  . Mitral regurgitation 06/25/2016  . Ascending aorta dilatation ([place]) 06/25/2016  . Dyspnea on exertion 04/04/2016  . Sinusitis, chronic 04/04/2016  . Cough variant asthma vs UACS 04/03/2016  . Primary osteoarthritis of left knee   . DJD (degenerative joint disease) of  knee 11/05/2014  . Encounter for therapeutic drug monitoring 05/07/2013  . Obstructive sleep apnea 01/20/2013  . Aortic stenosis 01/15/2013  . S/P AVR (aortic valve replacement) 01/15/2013  . Chronic diastolic CHF (congestive heart failure) ([place]) 11/14/2012  . Persistent atrial fibrillation ([place]) 07/17/2012  . Essential hypertension 07/17/2012    Past Surgical History:  Procedure Laterality Date  . BURR HOLE FOR SUBDURAL HEMATOMA  2006  . CARDIAC VALVE REPLACEMENT  2006   St. Jude AVR  . CARDIOVERSION N/A 07/22/2012   Procedure: CARDIOVERSION;  Surgeon: [name], MD;  Location: [facility] ENDOSCOPY;  Service: Cardiovascular;  Laterality: N/A;  . CARDIOVERSION N/A 11/25/2012   Procedure: CARDIOVERSION;  Surgeon: [name], MD;  Location: [facility] ENDOSCOPY;  Service: Cardiovascular;  Laterality: N/A;  . CARDIOVERSION N/A 12/30/2012   Procedure: CARDIOVERSION;  Surgeon: [name], MD;  Location: [facility] ENDOSCOPY;  Service: Cardiovascular;  Laterality: N/A;  h&p in file-HW   . CARDIOVERSION N/A 03/30/2013   Procedure: CARDIOVERSION;  Surgeon: [name], MD;  Location: [facility] ENDOSCOPY;  Service: Cardiovascular;  Laterality: N/A;  . ELECTROPHYSIOLOGIC STUDY  11/2013   Afib ablation x 2 (11/2013 and 10/2015) at [facility] by Dr Clyda Hurdle with recurrence post ablation  . KNEE ARTHROSCOPY Left 1980's   "2" (11/12/2012)  . KNEE SURGERY Left 1980's   "after 2 scopes they went in and did some  kind of OR" (11/12/2012)  . TEE WITHOUT CARDIOVERSION N/A 07/22/2012   Procedure: TRANSESOPHAGEAL ECHOCARDIOGRAM (TEE);  Surgeon: [name], MD;  Location: [facility] ENDOSCOPY;  Service: Cardiovascular;  Laterality: N/A;  Rm 2034  . TEE WITHOUT CARDIOVERSION N/A 10/07/2013   Procedure: TRANSESOPHAGEAL ECHOCARDIOGRAM (TEE);  Surgeon: [name], MD;  Location: [facility] ENDOSCOPY;  Service: Cardiovascular;  Laterality: N/A;  . TOTAL KNEE ARTHROPLASTY Left 11/05/2014   Procedure: TOTAL KNEE ARTHROPLASTY;  Surgeon: Dorna Leitz, MD;  Location:  [place];  Service: Orthopedics;  Laterality: Left;  . TUBAL LIGATION  1980's    Home Medications    Prior to Admission medications   Medication Sig Start Date End Date Taking? Authorizing Provider  ALPRAZolam Duanne Moron) 0.5 MG tablet Take 0.5 mg by mouth 2 (two) times daily as needed. AS NEEDED FOR ANXIETY 01/11/17  Yes [provider]  amiodarone (PACERONE) 200 MG tablet TAKE 1 TABLET BY MOUTH DAILY 04/08/17  Yes Turner, Eber Hong, MD  Ascorbic Acid (VITAMIN C) 1000 MG tablet Take 2,000 mg by mouth daily.   Yes [provider]  budesonide-formoterol (SYMBICORT) 80-4.5 MCG/ACT inhaler Inhale 2 puffs into the lungs 2 (two) times daily. 01/22/17  Yes Tanda Rockers, MD  calcium-vitamin D (OSCAL WITH D) 500-200 MG-UNIT per tablet Take 2 tablets by mouth daily.    Yes [provider]  Cholecalciferol (VITAMIN D) 2000 UNITS tablet Take 4,000 Units by mouth daily.   Yes [provider]  diazepam (VALIUM) 10 MG tablet Take 10 mg by mouth every 6 (six) hours as needed for anxiety.   Yes [provider]  DILT-XR 240 MG 24 hr capsule TAKE 1 CAPSULE BY MOUTH EVERY MORNING ON AN EMPTY STOMACH 02/08/17  Yes Turner, Eber Hong, MD  DULoxetine (CYMBALTA) 60 MG capsule Take 60 mg by mouth daily.   Yes [provider]  furosemide (LASIX) 80 MG tablet TAKE 1 TABLET BY MOUTH EVERY DAY 01/03/16  Yes Turner, Traci R, MD  levothyroxine (SYNTHROID, LEVOTHROID) 100 MCG tablet Take 100 mcg daily before breakfast by mouth.   Yes [provider]  losartan (COZAAR) 100 MG tablet Take 1 tablet (100 mg total) by mouth daily. 03/11/17  Yes Turner, Eber Hong, MD  mometasone-formoterol (DULERA) 100-5 MCG/ACT AERO Inhale 2 puffs into the lungs 2 (two) times daily. 04/25/17  Yes Tanda Rockers, MD  Multiple Vitamin (MULTIVITAMIN WITH MINERALS) TABS Take 1 tablet by mouth daily.   Yes [provider]  potassium chloride SA (K-DUR,KLOR-CON) 20 MEQ tablet TAKE 2 TABLETS BY  MOUTH EVERY MORNING AND 1 TABLET EVERY EVENING 10/03/16  Yes Turner, Traci R, MD  tretinoin (RETIN-A) 0.05 % cream Apply 1 application topically at bedtime.    Yes [provider]  warfarin (COUMADIN) 4 MG tablet TAKE AS DIRECTED PER COUMADIN CLINIC. 04/15/17  Yes Turner, Traci R, MD  zolpidem (AMBIEN) 10 MG tablet Take 5 mg by mouth at bedtime as needed for sleep.   Yes [provider]  benzonatate (TESSALON) 100 MG capsule Take 1 capsule (100 mg total) by mouth 3 (three) times daily as needed for cough. 05/18/17   Shelda Pal, DO  pravastatin (PRAVACHOL) 10 MG tablet Take 10 mg by mouth daily.    [provider]    Family History Family History  Problem Relation Age of Onset  . Lung cancer Mother   . Hypertension Mother   . Hyperlipidemia Father   . Hypertension Father   . Heart disease Brother  Social History Social History   Tobacco Use  . Smoking status: Never Smoker  . Smokeless tobacco: Never Used  Substance Use Topics  . Alcohol use: No    Comment: 11/12/2012 "no alcohol in the last 9 years"  . Drug use: No     Allergies   Codeine; Beta adrenergic blockers; Metoprolol; Propoxyphene; Toprol xl [metoprolol tartrate]; and Dilantin [phenytoin sodium extended]   Review of Systems Review of Systems  Constitutional: Negative for fever.  Respiratory: Positive for cough.      Physical Exam Triage Vital Signs ED Triage Vitals [05/18/17 1207]  Enc Vitals Group     BP 135/80     Pulse Rate 79     Resp 16     Temp 98.6 F (37 C)     Temp Source Oral     SpO2 95 %   Updated Vital Signs BP 135/80 (BP Location: Left Arm)   Pulse 79   Temp 98.6 F (37 C) (Oral)   Resp 16   SpO2 95%    Physical Exam  Constitutional: She appears well-developed and well-nourished.  HENT:  Head: Normocephalic.  Right Ear: External ear normal.  Left Ear: External ear normal.  Nose: Nose normal.  Mouth/Throat: Oropharynx is clear and moist. No  oropharyngeal exudate.  Eyes: EOM are normal. Pupils are equal, round, and reactive to light.  Neck: Normal range of motion. Neck supple.  Cardiovascular: Normal rate and regular rhythm.  Pulmonary/Chest: Effort normal and breath sounds normal. No respiratory distress.  Skin: Skin is warm and dry. She is not diaphoretic.  Psychiatric: She has a normal mood and affect. Judgment normal.     UC Treatments / Results  Procedures Procedures none  Initial Impression / Assessment and Plan / UC Course  I have reviewed the triage vital signs and the nursing notes.  Pertinent labs & imaging results that were available during my care of the patient were reviewed by me and considered in my medical decision making (see chart for details).    Pt presents with improving cough. Hx and exam unremarkable. Tx symptoms, push fluids, f/u if new symptoms arise. Pt voiced understanding and agreement to the plan.  Final Clinical Impressions(s) / UC Diagnoses   Final diagnoses:  Viral URI with cough    ED Discharge Orders        Ordered    benzonatate (TESSALON) 100 MG capsule  3 times daily PRN    Comments:  OK to sub with what insurance will cover.   05/18/17 1218       Controlled Substance Prescriptions [place] Controlled Substance Registry consulted? Not Applicable   Shelda Pal, [place] 05/18/17 1222

## 2017-05-18 NOTE — Discharge Instructions (Signed)
Continue to push fluids, practice good hand hygiene, and cover your mouth if you cough.  If you start having fevers, shaking or shortness of breath, seek immediate care.  

## 2017-05-18 NOTE — ED Triage Notes (Signed)
PT C/O: cold sx onset yest associated w/prod cough, decreased appetite  DENIES: fevers, nasal congestion/drainage  TAKING MEDS: Acetaminophen   A&O x4... NAD... Ambulatory

## 2017-05-30 DIAGNOSIS — K8020 Calculus of gallbladder without cholecystitis without obstruction: Secondary | ICD-10-CM | POA: Diagnosis not present

## 2017-05-30 DIAGNOSIS — R1032 Left lower quadrant pain: Secondary | ICD-10-CM | POA: Diagnosis not present

## 2017-05-30 DIAGNOSIS — K7689 Other specified diseases of liver: Secondary | ICD-10-CM | POA: Diagnosis not present

## 2017-05-30 DIAGNOSIS — N281 Cyst of kidney, acquired: Secondary | ICD-10-CM | POA: Diagnosis not present

## 2017-06-04 ENCOUNTER — Ambulatory Visit (INDEPENDENT_AMBULATORY_CARE_PROVIDER_SITE_OTHER): Payer: Medicare Other | Admitting: *Deleted

## 2017-06-04 DIAGNOSIS — Z5181 Encounter for therapeutic drug level monitoring: Secondary | ICD-10-CM | POA: Diagnosis not present

## 2017-06-04 DIAGNOSIS — T148XXA Other injury of unspecified body region, initial encounter: Secondary | ICD-10-CM | POA: Diagnosis not present

## 2017-06-04 DIAGNOSIS — I4819 Other persistent atrial fibrillation: Secondary | ICD-10-CM

## 2017-06-04 DIAGNOSIS — I481 Persistent atrial fibrillation: Secondary | ICD-10-CM

## 2017-06-04 LAB — POCT INR: INR: 3.3

## 2017-06-04 NOTE — Patient Instructions (Signed)
Description   Hold today's dose then start taking 1 tablet daily. Recheck INR in 2 weeks. Keep intake of greens consistent.

## 2017-06-17 ENCOUNTER — Other Ambulatory Visit: Payer: Self-pay | Admitting: Cardiology

## 2017-06-18 ENCOUNTER — Ambulatory Visit (INDEPENDENT_AMBULATORY_CARE_PROVIDER_SITE_OTHER): Payer: Medicare Other | Admitting: *Deleted

## 2017-06-18 DIAGNOSIS — I481 Persistent atrial fibrillation: Secondary | ICD-10-CM

## 2017-06-18 DIAGNOSIS — Z5181 Encounter for therapeutic drug level monitoring: Secondary | ICD-10-CM

## 2017-06-18 DIAGNOSIS — I4819 Other persistent atrial fibrillation: Secondary | ICD-10-CM

## 2017-06-18 LAB — POCT INR: INR: 2.8

## 2017-06-18 NOTE — Patient Instructions (Signed)
Description   Today take 1/2 tablet then start taking 1 tablet daily except 1/2 tablet on Tuesdays. Recheck INR in 2 weeks. Keep intake of greens consistent.

## 2017-06-19 ENCOUNTER — Other Ambulatory Visit: Payer: Self-pay | Admitting: Cardiology

## 2017-06-19 MED ORDER — FUROSEMIDE 80 MG PO TABS: 80.0000 mg | ORAL_TABLET | Freq: Every day | ORAL | 2 refills | Status: DC

## 2017-06-19 MED ORDER — POTASSIUM CHLORIDE CRYS ER 20 MEQ PO TBCR: EXTENDED_RELEASE_TABLET | ORAL | 2 refills | Status: DC

## 2017-06-20 DIAGNOSIS — I4891 Unspecified atrial fibrillation: Secondary | ICD-10-CM | POA: Diagnosis not present

## 2017-06-20 DIAGNOSIS — Z7901 Long term (current) use of anticoagulants: Secondary | ICD-10-CM | POA: Diagnosis not present

## 2017-06-20 DIAGNOSIS — E669 Obesity, unspecified: Secondary | ICD-10-CM | POA: Diagnosis not present

## 2017-06-20 DIAGNOSIS — K8020 Calculus of gallbladder without cholecystitis without obstruction: Secondary | ICD-10-CM | POA: Diagnosis not present

## 2017-06-20 DIAGNOSIS — G4733 Obstructive sleep apnea (adult) (pediatric): Secondary | ICD-10-CM | POA: Diagnosis not present

## 2017-06-21 ENCOUNTER — Telehealth: Payer: Self-pay

## 2017-06-21 NOTE — Telephone Encounter (Signed)
   [facility] Pre-operative Risk Assessment    Request for surgical clearance:  1. What type of surgery is being performed? Gallbladder removal   2. When is this surgery scheduled? TBD   3. What type of clearance is required (medical clearance vs. Pharmacy clearance to hold med vs. Both)? pharmacy  4. Are there any medications that need to be held prior to surgery and how long?Warfarin   5. Practice name and name of physician performing surgery? [facility]  6. What is your office phone and fax number? [phone] (p) [phone] (f)   7. Anesthesia type (None, local, MAC, general) ? General   Dollene Primrose 06/21/2017, 3:24 PM  _________________________________________________________________   (provider comments below)

## 2017-06-25 NOTE — Telephone Encounter (Signed)
Patient with diagnosis of atrial fibrillation and St Jude aortic valve on warfarin for anticoagulation.    Procedure: gallbladder removal Date of procedure: TBD  CHADS2-VASc score of  4 (CHF, HTN, AGE, female)  CrCl 105 Platelet count 286  Per office protocol, patient can hold warfarin for 5 days prior to procedure.    Patient will need bridging with Lovenox (enoxaparin) around procedure.

## 2017-06-25 NOTE — Telephone Encounter (Signed)
   Patient will need a Lovenox bridge because she has a Environmental health practitioner aortic valve.  Will route this to the Preop Call back team so that they can contact the patient.    Will also route this to the surgeons so they can coordinate the Lovenox bridge prior to surgery.  Rosaria Ferries, PA-C 06/25/2017 6:36 PM Bee[facility] [phone]

## 2017-06-26 NOTE — Telephone Encounter (Signed)
Tried to reach pt re: clearance / Coumadin instructions. Phone just rings busy. Will leave in preop call back pool to try again.

## 2017-06-26 NOTE — Telephone Encounter (Signed)
Left message for pt to call back to the preop pool, Monday - Friday, 1:30-5:00 M-F.

## 2017-06-27 NOTE — Telephone Encounter (Signed)
Spoke with pt re: surgical clearance / lovenox bridge. The pt informs me that they have decided not to have the gallbladder surgery at this time.  I advised pt that I would note her chart and close this out.  Pt thanked me for the call.

## 2017-07-02 ENCOUNTER — Ambulatory Visit (INDEPENDENT_AMBULATORY_CARE_PROVIDER_SITE_OTHER): Payer: Medicare Other | Admitting: *Deleted

## 2017-07-02 DIAGNOSIS — I481 Persistent atrial fibrillation: Secondary | ICD-10-CM

## 2017-07-02 DIAGNOSIS — Z952 Presence of prosthetic heart valve: Secondary | ICD-10-CM

## 2017-07-02 DIAGNOSIS — I4819 Other persistent atrial fibrillation: Secondary | ICD-10-CM

## 2017-07-02 DIAGNOSIS — Z5181 Encounter for therapeutic drug level monitoring: Secondary | ICD-10-CM | POA: Diagnosis not present

## 2017-07-02 LAB — POCT INR: INR: 2.3

## 2017-07-02 NOTE — Patient Instructions (Addendum)
Description   Continue same dose of coumadin  taking 1 tablet daily except 1/2 tablet on Wednesdays. Recheck INR in 3 weeks. Keep intake of greens consistent.  Phone number [phone] Fax [phone]

## 2017-07-05 DIAGNOSIS — J209 Acute bronchitis, unspecified: Secondary | ICD-10-CM | POA: Diagnosis not present

## 2017-07-09 DIAGNOSIS — K5641 Fecal impaction: Secondary | ICD-10-CM | POA: Diagnosis not present

## 2017-07-11 ENCOUNTER — Observation Stay (HOSPITAL_COMMUNITY)
Admission: EM | Admit: 2017-07-11 | Discharge: 2017-07-15 | Disposition: A | Discharge status: Home / Self Care | Payer: Medicare Other | Attending: Internal Medicine | Admitting: Internal Medicine

## 2017-07-11 ENCOUNTER — Other Ambulatory Visit: Payer: Self-pay

## 2017-07-11 ENCOUNTER — Emergency Department (HOSPITAL_COMMUNITY): Payer: Medicare Other

## 2017-07-11 ENCOUNTER — Encounter (HOSPITAL_COMMUNITY): Payer: Self-pay | Admitting: Emergency Medicine

## 2017-07-11 DIAGNOSIS — I480 Paroxysmal atrial fibrillation: Secondary | ICD-10-CM | POA: Diagnosis present

## 2017-07-11 DIAGNOSIS — Z96652 Presence of left artificial knee joint: Secondary | ICD-10-CM | POA: Insufficient documentation

## 2017-07-11 DIAGNOSIS — S301XXA Contusion of abdominal wall, initial encounter: Principal | ICD-10-CM

## 2017-07-11 DIAGNOSIS — I700 Atherosclerosis of aorta: Secondary | ICD-10-CM | POA: Diagnosis not present

## 2017-07-11 DIAGNOSIS — N281 Cyst of kidney, acquired: Secondary | ICD-10-CM | POA: Diagnosis not present

## 2017-07-11 DIAGNOSIS — B029 Zoster without complications: Secondary | ICD-10-CM | POA: Diagnosis not present

## 2017-07-11 DIAGNOSIS — Z9889 Other specified postprocedural states: Secondary | ICD-10-CM | POA: Insufficient documentation

## 2017-07-11 DIAGNOSIS — K5900 Constipation, unspecified: Secondary | ICD-10-CM | POA: Diagnosis not present

## 2017-07-11 DIAGNOSIS — G4733 Obstructive sleep apnea (adult) (pediatric): Secondary | ICD-10-CM | POA: Insufficient documentation

## 2017-07-11 DIAGNOSIS — R1031 Right lower quadrant pain: Secondary | ICD-10-CM | POA: Diagnosis not present

## 2017-07-11 DIAGNOSIS — S3011XA Contusion of abdominal wall, initial encounter: Secondary | ICD-10-CM

## 2017-07-11 DIAGNOSIS — R1084 Generalized abdominal pain: Secondary | ICD-10-CM | POA: Diagnosis not present

## 2017-07-11 DIAGNOSIS — F410 Panic disorder [episodic paroxysmal anxiety] without agoraphobia: Secondary | ICD-10-CM | POA: Insufficient documentation

## 2017-07-11 DIAGNOSIS — J45909 Unspecified asthma, uncomplicated: Secondary | ICD-10-CM | POA: Insufficient documentation

## 2017-07-11 DIAGNOSIS — T184XXA Foreign body in colon, initial encounter: Secondary | ICD-10-CM | POA: Diagnosis not present

## 2017-07-11 DIAGNOSIS — Z885 Allergy status to narcotic agent status: Secondary | ICD-10-CM | POA: Insufficient documentation

## 2017-07-11 DIAGNOSIS — E785 Hyperlipidemia, unspecified: Secondary | ICD-10-CM | POA: Insufficient documentation

## 2017-07-11 DIAGNOSIS — Z7901 Long term (current) use of anticoagulants: Secondary | ICD-10-CM | POA: Insufficient documentation

## 2017-07-11 DIAGNOSIS — E039 Hypothyroidism, unspecified: Secondary | ICD-10-CM | POA: Diagnosis not present

## 2017-07-11 DIAGNOSIS — Z952 Presence of prosthetic heart valve: Secondary | ICD-10-CM | POA: Insufficient documentation

## 2017-07-11 DIAGNOSIS — I481 Persistent atrial fibrillation: Secondary | ICD-10-CM | POA: Diagnosis not present

## 2017-07-11 DIAGNOSIS — X58XXXA Exposure to other specified factors, initial encounter: Secondary | ICD-10-CM | POA: Diagnosis not present

## 2017-07-11 DIAGNOSIS — I080 Rheumatic disorders of both mitral and aortic valves: Secondary | ICD-10-CM | POA: Insufficient documentation

## 2017-07-11 DIAGNOSIS — I10 Essential (primary) hypertension: Secondary | ICD-10-CM | POA: Diagnosis present

## 2017-07-11 DIAGNOSIS — I081 Rheumatic disorders of both mitral and tricuspid valves: Secondary | ICD-10-CM | POA: Insufficient documentation

## 2017-07-11 DIAGNOSIS — T148XXA Other injury of unspecified body region, initial encounter: Secondary | ICD-10-CM | POA: Diagnosis not present

## 2017-07-11 DIAGNOSIS — K8020 Calculus of gallbladder without cholecystitis without obstruction: Secondary | ICD-10-CM | POA: Insufficient documentation

## 2017-07-11 DIAGNOSIS — R911 Solitary pulmonary nodule: Secondary | ICD-10-CM | POA: Diagnosis not present

## 2017-07-11 DIAGNOSIS — I110 Hypertensive heart disease with heart failure: Secondary | ICD-10-CM | POA: Insufficient documentation

## 2017-07-11 DIAGNOSIS — I5032 Chronic diastolic (congestive) heart failure: Secondary | ICD-10-CM | POA: Insufficient documentation

## 2017-07-11 DIAGNOSIS — Z79899 Other long term (current) drug therapy: Secondary | ICD-10-CM | POA: Insufficient documentation

## 2017-07-11 DIAGNOSIS — M7981 Nontraumatic hematoma of soft tissue: Secondary | ICD-10-CM | POA: Diagnosis not present

## 2017-07-11 DIAGNOSIS — D649 Anemia, unspecified: Secondary | ICD-10-CM | POA: Insufficient documentation

## 2017-07-11 DIAGNOSIS — T80818A Extravasation of other vesicant agent, initial encounter: Secondary | ICD-10-CM | POA: Insufficient documentation

## 2017-07-11 DIAGNOSIS — R110 Nausea: Secondary | ICD-10-CM | POA: Diagnosis not present

## 2017-07-11 DIAGNOSIS — Z9989 Dependence on other enabling machines and devices: Secondary | ICD-10-CM | POA: Insufficient documentation

## 2017-07-11 DIAGNOSIS — Z888 Allergy status to other drugs, medicaments and biological substances status: Secondary | ICD-10-CM | POA: Insufficient documentation

## 2017-07-11 DIAGNOSIS — Z8249 Family history of ischemic heart disease and other diseases of the circulatory system: Secondary | ICD-10-CM | POA: Insufficient documentation

## 2017-07-11 LAB — I-STAT CHEM 8, ED
BUN: 15 mg/dL (ref 6–20)
CALCIUM ION: 1.17 mmol/L (ref 1.15–1.40)
Chloride: 100 mmol/L — ABNORMAL LOW (ref 101–111)
Creatinine, Ser: 0.8 mg/dL (ref 0.44–1.00)
Glucose, Bld: 116 mg/dL — ABNORMAL HIGH (ref 65–99)
HEMATOCRIT: 36 % (ref 36.0–46.0)
HEMOGLOBIN: 12.2 g/dL (ref 12.0–15.0)
Potassium: 4.2 mmol/L (ref 3.5–5.1)
Sodium: 140 mmol/L (ref 135–145)
TCO2: 30 mmol/L (ref 22–32)

## 2017-07-11 LAB — URINALYSIS, ROUTINE W REFLEX MICROSCOPIC
Bilirubin Urine: NEGATIVE
Glucose, UA: NEGATIVE mg/dL
Hgb urine dipstick: NEGATIVE
KETONES UR: NEGATIVE mg/dL
LEUKOCYTES UA: NEGATIVE
Nitrite: NEGATIVE
PROTEIN: NEGATIVE mg/dL
Specific Gravity, Urine: 1.008 (ref 1.005–1.030)
pH: 8 (ref 5.0–8.0)

## 2017-07-11 LAB — CBC WITH DIFFERENTIAL/PLATELET
Basophils Absolute: 0 10*3/uL (ref 0.0–0.1)
Basophils Relative: 0 %
EOS PCT: 2 %
Eosinophils Absolute: 0.1 10*3/uL (ref 0.0–0.7)
HCT: 36.7 % (ref 36.0–46.0)
Hemoglobin: 11.7 g/dL — ABNORMAL LOW (ref 12.0–15.0)
LYMPHS ABS: 1 10*3/uL (ref 0.7–4.0)
LYMPHS PCT: 14 %
MCH: 29 pg (ref 26.0–34.0)
MCHC: 31.9 g/dL (ref 30.0–36.0)
MCV: 90.8 fL (ref 78.0–100.0)
MONO ABS: 0.6 10*3/uL (ref 0.1–1.0)
MONOS PCT: 9 %
Neutro Abs: 5.6 10*3/uL (ref 1.7–7.7)
Neutrophils Relative %: 75 %
PLATELETS: 324 10*3/uL (ref 150–400)
RBC: 4.04 MIL/uL (ref 3.87–5.11)
RDW: 14.4 % (ref 11.5–15.5)
WBC: 7.4 10*3/uL (ref 4.0–10.5)

## 2017-07-11 LAB — COMPREHENSIVE METABOLIC PANEL
ALT: 22 U/L (ref 14–54)
AST: 25 U/L (ref 15–41)
Albumin: 3.7 g/dL (ref 3.5–5.0)
Alkaline Phosphatase: 100 U/L (ref 38–126)
Anion gap: 9 (ref 5–15)
BUN: 15 mg/dL (ref 6–20)
CHLORIDE: 103 mmol/L (ref 101–111)
CO2: 29 mmol/L (ref 22–32)
Calcium: 9.1 mg/dL (ref 8.9–10.3)
Creatinine, Ser: 0.82 mg/dL (ref 0.44–1.00)
GFR calc non Af Amer: >60 mL/min (ref >60)
Glucose, Bld: 117 mg/dL — ABNORMAL HIGH (ref 65–99)
POTASSIUM: 4.2 mmol/L (ref 3.5–5.1)
Sodium: 141 mmol/L (ref 135–145)
TOTAL PROTEIN: 7.4 g/dL (ref 6.5–8.1)
Total Bilirubin: 0.5 mg/dL (ref 0.3–1.2)

## 2017-07-11 LAB — LIPASE, BLOOD: LIPASE: 20 U/L (ref 11–51)

## 2017-07-11 LAB — PROTIME-INR
INR: 1.92
PROTHROMBIN TIME: 21.8 s — AB (ref 11.4–15.2)

## 2017-07-11 MED ORDER — IOPAMIDOL (ISOVUE-300) INJECTION 61%
INTRAVENOUS | Status: AC
  Filled 2017-07-11: qty 100

## 2017-07-11 MED ORDER — SODIUM CHLORIDE 0.9% FLUSH: 3.0000 mL | INTRAVENOUS | Status: DC | PRN

## 2017-07-11 MED ORDER — SODIUM CHLORIDE 0.9 % IV BOLUS
1000.0000 mL | Freq: Once | INTRAVENOUS | Status: AC
Start: 2017-07-11 — End: 2017-07-11
  Administered 2017-07-11: 1000 mL via INTRAVENOUS

## 2017-07-11 MED ORDER — POTASSIUM CHLORIDE CRYS ER 20 MEQ PO TBCR
40.0000 meq | EXTENDED_RELEASE_TABLET | Freq: Every day | ORAL | Status: DC
  Administered 2017-07-12 – 2017-07-13 (×2): 40 meq via ORAL
  Filled 2017-07-11 (×2): qty 2

## 2017-07-11 MED ORDER — IOPAMIDOL (ISOVUE-300) INJECTION 61%
100.0000 mL | Freq: Once | INTRAVENOUS | Status: AC | PRN
  Administered 2017-07-11: 100 mL via INTRAVENOUS

## 2017-07-11 MED ORDER — POTASSIUM CHLORIDE CRYS ER 20 MEQ PO TBCR
20.0000 meq | EXTENDED_RELEASE_TABLET | Freq: Every day | ORAL | Status: DC
  Administered 2017-07-11 – 2017-07-14 (×4): 20 meq via ORAL
  Filled 2017-07-11 (×4): qty 1

## 2017-07-11 MED ORDER — FUROSEMIDE 40 MG PO TABS
80.0000 mg | ORAL_TABLET | Freq: Every day | ORAL | Status: DC
  Administered 2017-07-12 – 2017-07-15 (×4): 80 mg via ORAL
  Filled 2017-07-11 (×4): qty 2

## 2017-07-11 MED ORDER — TRETINOIN 0.05 % EX CREA: 1.0000 "application " | TOPICAL_CREAM | Freq: Every day | CUTANEOUS | Status: DC

## 2017-07-11 MED ORDER — DIAZEPAM 5 MG PO TABS: 10.0000 mg | ORAL_TABLET | Freq: Four times a day (QID) | ORAL | Status: DC | PRN

## 2017-07-11 MED ORDER — SODIUM CHLORIDE 0.9% FLUSH
3.0000 mL | Freq: Two times a day (BID) | INTRAVENOUS | Status: DC
  Administered 2017-07-11 – 2017-07-14 (×7): 3 mL via INTRAVENOUS

## 2017-07-11 MED ORDER — AMIODARONE HCL 200 MG PO TABS
200.0000 mg | ORAL_TABLET | Freq: Every day | ORAL | Status: DC
  Administered 2017-07-12 – 2017-07-15 (×4): 200 mg via ORAL
  Filled 2017-07-11 (×4): qty 1

## 2017-07-11 MED ORDER — ZOLPIDEM TARTRATE 5 MG PO TABS
5.0000 mg | ORAL_TABLET | Freq: Every evening | ORAL | Status: DC | PRN
  Administered 2017-07-11 – 2017-07-14 (×4): 5 mg via ORAL
  Filled 2017-07-11 (×4): qty 1

## 2017-07-11 MED ORDER — DILTIAZEM HCL ER 240 MG PO CP24: 240.0000 mg | ORAL_CAPSULE | Freq: Every day | ORAL | Status: DC

## 2017-07-11 MED ORDER — LOSARTAN POTASSIUM 50 MG PO TABS
100.0000 mg | ORAL_TABLET | Freq: Every day | ORAL | Status: DC
  Administered 2017-07-12 – 2017-07-15 (×4): 100 mg via ORAL
  Filled 2017-07-11 (×4): qty 2

## 2017-07-11 MED ORDER — MORPHINE SULFATE (PF) 2 MG/ML IV SOLN
2.0000 mg | Freq: Once | INTRAVENOUS | Status: AC
  Administered 2017-07-11: 2 mg via INTRAVENOUS
  Filled 2017-07-11: qty 1

## 2017-07-11 MED ORDER — ONDANSETRON HCL 4 MG/2ML IJ SOLN
4.0000 mg | Freq: Once | INTRAMUSCULAR | Status: AC
  Administered 2017-07-11: 4 mg via INTRAVENOUS
  Filled 2017-07-11: qty 2

## 2017-07-11 MED ORDER — SODIUM CHLORIDE 0.9 % IV SOLN: 250.0000 mL | INTRAVENOUS | Status: DC | PRN

## 2017-07-11 MED ORDER — LEVOTHYROXINE SODIUM 100 MCG PO TABS
100.0000 ug | ORAL_TABLET | Freq: Every day | ORAL | Status: DC
  Administered 2017-07-12 – 2017-07-15 (×4): 100 ug via ORAL
  Filled 2017-07-11 (×4): qty 1

## 2017-07-11 MED ORDER — MOMETASONE FURO-FORMOTEROL FUM 100-5 MCG/ACT IN AERO
2.0000 | INHALATION_SPRAY | Freq: Two times a day (BID) | RESPIRATORY_TRACT | Status: DC
  Administered 2017-07-12 – 2017-07-15 (×7): 2 via RESPIRATORY_TRACT
  Filled 2017-07-11: qty 8.8

## 2017-07-11 MED ORDER — PRAVASTATIN SODIUM 20 MG PO TABS
10.0000 mg | ORAL_TABLET | Freq: Every day | ORAL | Status: DC
  Administered 2017-07-12 – 2017-07-15 (×4): 10 mg via ORAL
  Filled 2017-07-11 (×4): qty 1

## 2017-07-11 MED ORDER — DULOXETINE HCL 60 MG PO CPEP
60.0000 mg | ORAL_CAPSULE | Freq: Every day | ORAL | Status: DC
  Administered 2017-07-11 – 2017-07-15 (×5): 60 mg via ORAL
  Filled 2017-07-11 (×5): qty 1

## 2017-07-11 MED ORDER — DILTIAZEM HCL ER COATED BEADS 240 MG PO CP24
240.0000 mg | ORAL_CAPSULE | Freq: Every day | ORAL | Status: DC
  Administered 2017-07-12 – 2017-07-15 (×4): 240 mg via ORAL
  Filled 2017-07-11 (×4): qty 1

## 2017-07-11 NOTE — ED Triage Notes (Addendum)
Pt to ED via [facility] with c/o of RLQ pain that started 0730 am, she had a manual bowel disimpaction at doctors office yesterday. Pt c/o of constant constipation for the last 3 months. Pt denies any GI bleed type symptoms but pt does has h/x of AFIB and on coumadin. Pt is on antibiotics for LLL pneumonia and is on antibiotics for the last 2 weeks. No N/V or rebound tenderness. Pt denies any abdominal surgeries. Pt did have bowel movement today.

## 2017-07-11 NOTE — H&P (Addendum)
TRH H&P   Patient Demographics:    [name], is a 72 y.o. female  MRN: [id number]   DOB - [birth year]  Admit Date - 07/11/2017  Outpatient Primary MD for the patient is Maurice Small, MD  Referring MD/NP/PA:  Deno Etienne  Outpatient Specialists:   Patient coming from: home  Chief Complaint  Patient presents with  . Abdominal Pain    RLQ      HPI:    [name]  is a 72 y.o. female, w hypertension, hyperlipidemia, aortic stenosis, mitral regurgitation, pafib, subdural hematoma, apparently c/o right sided pain.  Starting this am.  Pt had manual disimpaction at physician office yesterday. Pt noted constipation x3 months.  Recently on abx for LLL pneumonia for the past 2 weeks.  Pt denies fever, chills, n/v, diarrhea, brbpr.   In ED.  CT scan  IMPRESSION: 1. 16 x 10 x 6 cm heterogeneous mass in the lower right rectus sheath has CT imaging features most consistent with rectus sheath hematoma. There is a small blush of contrast extravasation along the cranial portion of the hematoma suggesting continued bleeding. 2. Tiny metallic foreign body in the cecum, likely ingested. 3. Cholelithiasis. 4. Hepatic and right renal cyst. 5. 7 mm right lower lobe pulmonary nodule. Non-contrast chest CT at 6-12 months is recommended. If the nodule is stable at time of repeat CT, then future CT at 18-24 months (from today's scan) is considered optional for low-risk patients, but is recommended for high-risk patients. This recommendation follows the consensus statement: Guidelines for Management of Incidental Pulmonary Nodules Detected on CT Images: From the Fleischner Society 2017; Radiology  Wbc 7.4, Hgb 11.7, Plt 324 Na 141, K 4.2, Bun 15, Creatinine 0.82, Ast 25, Alt 22 Lipase 20 Urinalysis negative INR 1.92    ED spoke with surgery Barry Dienes) and per ED, no surgical  intervention needed at this time.    Pt will be admitted for right lower rectus sheath hematoma.     Review of systems:    In addition to the HPI above,    No Fever-chills, No Headache, No changes with Vision or hearing, No problems swallowing food or Liquids, No Chest pain, Cough or Shortness of Breath, , No Nausea or Vommitting, Bowel movements are regular, No Blood in stool or Urine, No dysuria, No new skin rashes or bruises, No new joints pains-aches,  No new weakness, tingling, numbness in any extremity, No recent weight gain or loss, No polyuria, polydypsia or polyphagia, No significant Mental Stressors.  A full 10 point Review of Systems was done, except as stated above, all other Review of Systems were negative.   With Past History of the following :    Past Medical History:  Diagnosis Date  . Anxiety   . Aortic stenosis    status post aortic valve replacement with St. Jude mechanical prosthesis  . Ascending aorta  dilatation ([place]) 06/25/2016   72mm by echo 06/2016 and 20mm by CT angin 2016  . Chronic anticoagulation   . 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 Depression   . DJD (degenerative joint disease) of knee   . Dyslipidemia   . GERD (gastroesophageal reflux disease)    "several years ago; none since" (11/12/2012)  . Hyperlipidemia   . Hyperlipidemia LDL goal <70 01/19/2017  . Hypertension   . Left atrial enlargement   . Mitral regurgitation 06/25/2016   Mild moderate MR by echo 06/2016  . Obesity   . Persistent atrial fibrillation (HCC)    s/p afib ablation x 2 at [facility]  . Sleep apnea    On CPAP at 12cm H2O  . 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 - now cleared by neurosurgery to maintain INR 2-2.5      Past Surgical History:  Procedure Laterality Date  . BURR HOLE FOR SUBDURAL HEMATOMA  2006  . CARDIAC VALVE REPLACEMENT  2006   St. Jude AVR  . CARDIOVERSION N/A 07/22/2012    Procedure: CARDIOVERSION;  Surgeon: [name], MD;  Location: [facility] ENDOSCOPY;  Service: Cardiovascular;  Laterality: N/A;  . CARDIOVERSION N/A 11/25/2012   Procedure: CARDIOVERSION;  Surgeon: [name], MD;  Location: [facility] ENDOSCOPY;  Service: Cardiovascular;  Laterality: N/A;  . CARDIOVERSION N/A 12/30/2012   Procedure: CARDIOVERSION;  Surgeon: [name], MD;  Location: [facility] ENDOSCOPY;  Service: Cardiovascular;  Laterality: N/A;  h&p in file-HW   . CARDIOVERSION N/A 03/30/2013   Procedure: CARDIOVERSION;  Surgeon: [name], MD;  Location: [facility] ENDOSCOPY;  Service: Cardiovascular;  Laterality: N/A;  . ELECTROPHYSIOLOGIC STUDY  11/2013   Afib ablation x 2 (11/2013 and 10/2015) at [facility] by Dr Clyda Hurdle with recurrence post ablation  . KNEE ARTHROSCOPY Left 1980's   "2" (11/12/2012)  . KNEE SURGERY Left 1980's   "after 2 scopes they went in and did some kind of OR" (11/12/2012)  . TEE WITHOUT CARDIOVERSION N/A 07/22/2012   Procedure: TRANSESOPHAGEAL ECHOCARDIOGRAM (TEE);  Surgeon: [name], MD;  Location: [facility] ENDOSCOPY;  Service: Cardiovascular;  Laterality: N/A;  Rm 2034  . TEE WITHOUT CARDIOVERSION N/A 10/07/2013   Procedure: TRANSESOPHAGEAL ECHOCARDIOGRAM (TEE);  Surgeon: [name], MD;  Location: [facility] ENDOSCOPY;  Service: Cardiovascular;  Laterality: N/A;  . TOTAL KNEE ARTHROPLASTY Left 11/05/2014   Procedure: TOTAL KNEE ARTHROPLASTY;  Surgeon: Dorna Leitz, MD;  Location: [place];  Service: Orthopedics;  Laterality: Left;  . TUBAL LIGATION  1980's      Social History:     Social History   Tobacco Use  . Smoking status: Never Smoker  . Smokeless tobacco: Never Used  Substance Use Topics  . Alcohol use: No    Comment: 11/12/2012 "no alcohol in the last 9 years"     Lives - at home  Mobility - walks by self   Family History :     Family History  Problem Relation Age of Onset  . Lung cancer Mother   . Hypertension Mother   . Hyperlipidemia Father   . Hypertension Father   .  Heart disease Brother       Home Medications:   Prior to Admission medications   Medication Sig Start Date End Date Taking? Authorizing Provider  ALPRAZolam Duanne Moron) 0.5 MG tablet Take 0.5 mg by mouth 2 (two) times daily as needed. AS NEEDED FOR ANXIETY 01/11/17  Yes [provider]  amiodarone (PACERONE) 200 MG tablet TAKE 1 TABLET BY MOUTH DAILY 04/08/17  Yes Turner, Eber Hong, MD  amoxicillin (AMOXIL) 500 MG capsule Take 2 capsules by mouth every 8 (eight) hours. 07/05/17  Yes [provider]  Ascorbic Acid (VITAMIN C) 1000 MG tablet Take 2,000 mg by mouth daily.   Yes [provider]  calcium-vitamin D (OSCAL WITH D) 500-200 MG-UNIT per tablet Take 2 tablets by mouth daily.    Yes [provider]  Cholecalciferol (VITAMIN D) 2000 UNITS tablet Take 4,000 Units by mouth daily.   Yes [provider]  diazepam (VALIUM) 10 MG tablet Take 10 mg by mouth every 6 (six) hours as needed for anxiety.   Yes [provider]  DILT-XR 240 MG 24 hr capsule TAKE 1 CAPSULE BY MOUTH EVERY MORNING ON AN EMPTY STOMACH 02/08/17  Yes Turner, Eber Hong, MD  DULoxetine (CYMBALTA) 60 MG capsule Take 60 mg by mouth daily.   Yes [provider]  furosemide (LASIX) 80 MG tablet Take 1 tablet (80 mg total) by mouth daily. 06/19/17  Yes Turner, Eber Hong, MD  levothyroxine (SYNTHROID, LEVOTHROID) 100 MCG tablet Take 100 mcg daily before breakfast by mouth.   Yes [provider]  losartan (COZAAR) 100 MG tablet Take 1 tablet (100 mg total) by mouth daily. 03/11/17  Yes Turner, Eber Hong, MD  mometasone-formoterol (DULERA) 100-5 MCG/ACT AERO Inhale 2 puffs into the lungs 2 (two) times daily. 04/25/17  Yes Tanda Rockers, MD  Multiple Vitamin (MULTIVITAMIN WITH MINERALS) TABS Take 1 tablet by mouth daily.   Yes [provider]  potassium chloride SA (K-DUR,KLOR-CON) 20 MEQ tablet TAKE 2 TABLETS BY MOUTH EVERY MORNING AND 1 TABLET EVERY EVENING 06/19/17  Yes  Turner, Traci R, MD  pravastatin (PRAVACHOL) 10 MG tablet Take 10 mg by mouth daily.   Yes [provider]  tretinoin (RETIN-A) 0.05 % cream Apply 1 application topically at bedtime.    Yes [provider]  warfarin (COUMADIN) 4 MG tablet TAKE AS DIRECTED PER COUMADIN CLINIC. Patient taking differently: Take 4 mg by mouth once a day except for Wednesday. On Wednesday, take 2 mg by mouth once a day. 04/15/17  Yes Turner, Eber Hong, MD  zolpidem (AMBIEN) 10 MG tablet Take 5 mg by mouth at bedtime as needed for sleep.   Yes [provider]  benzonatate (TESSALON) 100 MG capsule Take 1 capsule (100 mg total) by mouth 3 (three) times daily as needed for cough. Patient not taking: Reported on 07/11/2017 05/18/17   Shelda Pal, DO  budesonide-formoterol [facility]) 80-4.5 MCG/ACT inhaler Inhale 2 puffs into the lungs 2 (two) times daily. 01/22/17   Tanda Rockers, MD     Allergies:     Allergies  Allergen Reactions  . Codeine Anaphylaxis    Jerking, involuntary jerking   . Beta Adrenergic Blockers     Makes her crazy Makes her crazy  Makes her crazy  . Metoprolol     depression depression  . Propoxyphene Other (See Comments)    Bad vivid dreams Bad vivid dreams  . Toprol Xl [Metoprolol Tartrate]     depression  . Dilantin [Phenytoin Sodium Extended] Rash and Itching     Physical Exam:   Vitals  Blood pressure 139/70, pulse 71, temperature 98.7 F (37.1 C), temperature source Oral, resp. rate (!) 21, SpO2 98 %.   1. General  lying in bed in NAD,   2.  Normal affect and insight, Not Suicidal or Homicidal, Awake Alert, Oriented X 3.  3. No F.N deficits, ALL C.Nerves Intact, Strength 5/5 all 4 extremities, Sensation intact all 4 extremities, Plantars down going.  4. Ears and Eyes appear Normal, Conjunctivae clear, PERRLA. Moist Oral Mucosa.  5. Supple Neck, No JVD, No cervical lymphadenopathy appriciated, No Carotid Bruits.  6. Symmetrical  Chest wall movement, Good air movement bilaterally, CTAB.  7. RRR, No Gallops, Rubs or Murmurs, No Parasternal Heave.  8. Positive Bowel Sounds, Abdomen Soft, No tenderness, No organomegaly appriciated,No rebound -guarding or rigidity.  9.  No Cyanosis, Normal Skin Turgor, No Skin Rash or Bruise.  10. Good muscle tone,  joints appear normal , no effusions, Normal ROM.  11. No Palpable Lymph Nodes in Neck or Axillae     Data Review:    CBC Recent Labs  Lab 07/11/17 1625 07/11/17 1633  WBC 7.4  --   HGB 11.7* 12.2  HCT 36.7 36.0  PLT 324  --   MCV 90.8  --   MCH 29.0  --   MCHC 31.9  --   RDW 14.4  --   LYMPHSABS 1.0  --   MONOABS 0.6  --   EOSABS 0.1  --   BASOSABS 0.0  --    ------------------------------------------------------------------------------------------------------------------  Chemistries  Recent Labs  Lab 07/11/17 1625 07/11/17 1633  NA 141 140  K 4.2 4.2  CL 103 100*  CO2 29  --   GLUCOSE 117* 116*  BUN 15 15  CREATININE 0.82 0.80  CALCIUM 9.1  --   AST 25  --   ALT 22  --   ALKPHOS 100  --   BILITOT 0.5  --    ------------------------------------------------------------------------------------------------------------------ CrCl cannot be calculated (Unknown ideal weight.). ------------------------------------------------------------------------------------------------------------------ No results for input(s): TSH, T4TOTAL, T3FREE, THYROIDAB in the last 72 hours.  Invalid input(s): FREET3  Coagulation profile Recent Labs  Lab 07/11/17 1625  INR 1.92   ------------------------------------------------------------------------------------------------------------------- No results for input(s): DDIMER in the last 72 hours. -------------------------------------------------------------------------------------------------------------------  Cardiac Enzymes No results for input(s): CKMB, TROPONINI, MYOGLOBIN in the last 168  hours.  Invalid input(s): CK ------------------------------------------------------------------------------------------------------------------ No results found for: BNP   ---------------------------------------------------------------------------------------------------------------  Urinalysis    Component Value Date/Time   COLORURINE YELLOW 07/11/2017 1625   APPEARANCEUR CLEAR 07/11/2017 1625   LABSPEC 1.008 07/11/2017 1625   PHURINE 8.0 07/11/2017 1625   GLUCOSEU NEGATIVE 07/11/2017 1625   HGBUR NEGATIVE 07/11/2017 1625   BILIRUBINUR NEGATIVE 07/11/2017 1625   KETONESUR NEGATIVE 07/11/2017 1625   PROTEINUR NEGATIVE 07/11/2017 1625   NITRITE NEGATIVE 07/11/2017 1625   LEUKOCYTESUR NEGATIVE 07/11/2017 1625    ----------------------------------------------------------------------------------------------------------------   Imaging Results:    Ct Abdomen Pelvis W Contrast  Result Date: 07/11/2017 CLINICAL DATA:  Right lower quadrant pain EXAM: CT ABDOMEN AND PELVIS WITH CONTRAST TECHNIQUE: Multidetector CT imaging of the abdomen and pelvis was performed using the standard protocol following bolus administration of intravenous contrast. CONTRAST:  170mL ISOVUE-300 IOPAMIDOL (ISOVUE-300) INJECTION 61% COMPARISON:  10/23/2016 virtual colonoscopy. FINDINGS: Lower chest: 7 mm nodule identified medial right costophrenic sulcus (image 34/4). Hepatobiliary: 12 mm low-density lesion dome of left liver is stable and compatible with cyst. Liver otherwise unremarkable. 3.1 cm calcified gallstone evident. No intrahepatic or extrahepatic biliary dilation. Pancreas: No focal mass lesion. No dilatation of the main duct. No intraparenchymal cyst. No peripancreatic edema. Spleen: No splenomegaly. No focal mass lesion. Adrenals/Urinary Tract: No adrenal nodule or mass. 6.1 cm cyst in the upper  pole right kidney is similar to prior. Right kidney otherwise unremarkable. Left kidney unremarkable. No evidence  for hydroureter. Extrinsic mass-effect on the bladder which is otherwise unremarkable. Stomach/Bowel: Small hiatal hernia. Duodenum is normally positioned as is the ligament of Treitz. No small bowel wall thickening. No small bowel dilatation. The terminal ileum is normal. The appendix is not visualized, but there is no edema or inflammation in the region of the cecum. Small metallic foreign body in the cecum compatible with ingested foreign body. Diverticular changes are noted in the left colon without evidence of diverticulitis. Vascular/Lymphatic: There is abdominal aortic atherosclerosis without aneurysm. Mild haziness in the central small bowel mesentery is nonspecific. There is no gastrohepatic or hepatoduodenal ligament lymphadenopathy. No intraperitoneal or retroperitoneal lymphadenopathy. No pelvic sidewall lymphadenopathy. Reproductive: The uterus has normal CT imaging appearance. There is no adnexal mass. Other: No intraperitoneal free fluid. Musculoskeletal: 15.6 x 9.7 x 6.2 cm heterogeneous mass is identified in the lower right rectus sheath. Imaging features most suggestive of rectus sheath hematoma. There is a tiny focus of hyper attenuation (image 64 series 2 and coronal image 37 series 5) suggesting continued extravasation/bleeding. There is some edema/hemorrhage in the extraperitoneal anterior pelvis. The hematoma generates mass-effect on the bladder. Small bilateral groin hernias contain only fat. Degenerative disc disease noted L4-5. IMPRESSION: 1. 16 x 10 x 6 cm heterogeneous mass in the lower right rectus sheath has CT imaging features most consistent with rectus sheath hematoma. There is a small blush of contrast extravasation along the cranial portion of the hematoma suggesting continued bleeding. 2. Tiny metallic foreign body in the cecum, likely ingested. 3. Cholelithiasis. 4. Hepatic and right renal cyst. 5. 7 mm right lower lobe pulmonary nodule. Non-contrast chest CT at 6-12 months is  recommended. If the nodule is stable at time of repeat CT, then future CT at 18-24 months (from today's scan) is considered optional for low-risk patients, but is recommended for high-risk patients. This recommendation follows the consensus statement: Guidelines for Management of Incidental Pulmonary Nodules Detected on CT Images: From the Fleischner Society 2017; Radiology 2017; 284:228-243. 6.  Aortic Atherosclerois (ICD10-170.0) I personally discussed the presence of the rectus sheath hematoma with contrast extravasation by telephone with Dr. Tyrone Nine at 1858 hours on 07/11/2017. Electronically Signed   By: Misty Stanley M.D.   On: 07/11/2017 18:58       Assessment & Plan:    Principal Problem:   Hematoma Active Problems:   Persistent atrial fibrillation (HCC)   Rectus sheath hematoma Hold coumadin  Check INR in am  Pafib Tele Cont Amiodarone Cont Cardizem XR 240mg  po qday Cardiology consult regarding coumadin management  Hyperlipidemia Cont Pravastatin 10mg  po qhs  Hypothyroidism Cont Levothyroxine 100 micrograms po qday  Anxiety Cont Xanax 0.5mg  po bid prn  Asthma Cont Symbicort=> Dulera  DVT Prophylaxis  - SCDs  AM Labs Ordered, also please review Full Orders  Family Communication: Admission, patients condition and plan of care including tests being ordered have been discussed with the patient who indicate understanding and agree with the plan and Code Status.  Code Status FULL CODE  Likely DC to  home  Condition GUARDED    Consults called: cardiology by email  Admission status: inpatient  Time spent in minutes : 45   Jani Gravel M.D on 07/11/2017 at 8:20 PM  Between 7am to 7pm - Pager - [phone]. After 7pm go to [email or web address] - password  [facility]  [facility] - Office  [phone]

## 2017-07-11 NOTE — ED Provider Notes (Signed)
[place] DEPT Provider Note   CSN: [id number] Arrival date & time: 07/11/17  1542     History   Chief Complaint Chief Complaint  Patient presents with  . Abdominal Pain    RLQ    HPI [name] is a 72 y.o. female.  72 yo F with a chief complaint of right lower quadrant abdominal pain.  The patient woke up with this this morning.  Worse with movement palpation and twisting coughing.  She denies fevers or chills.  Denies nausea or vomiting.  She was recently seen yesterday for fecal impaction.  She thinks that that problem has since resolved.  Denies prior abdominal surgery.  The history is provided by the patient and the spouse.  Abdominal Pain   This is a new problem. The current episode started 12 to 24 hours ago. The problem occurs constantly. The problem has been gradually worsening. The pain is associated with an unknown factor. The pain is located in the RLQ. The quality of the pain is aching, sharp and shooting. The pain is at a severity of 8/10. The pain is severe. Associated symptoms include nausea and constipation. Pertinent negatives include fever, vomiting, dysuria, headaches, arthralgias and myalgias. The symptoms are aggravated by certain positions, coughing and activity. The symptoms are relieved by being still.    Past Medical History:  Diagnosis Date  . Anxiety   . Aortic stenosis    status post aortic valve replacement with St. Jude mechanical prosthesis  . 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   35mm by echo 06/2016 and 60mm by CT angin 2016  . Chronic anticoagulation   . 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 Depression   . DJD (degenerative joint disease) of knee   . Dyslipidemia   . GERD (gastroesophageal reflux disease)    "several years ago; none since" (11/12/2012)  . Hyperlipidemia   . Hyperlipidemia LDL goal <70 01/19/2017  . Hypertension   . Left atrial enlargement     . Mitral regurgitation 06/25/2016   Mild moderate MR by echo 06/2016  . Obesity   . Persistent atrial fibrillation (HCC)    s/p afib ablation x 2 at [facility]  . Sleep apnea    On CPAP at 12cm H2O  . 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 Hematoma 07/11/2017  . Hyperlipidemia LDL goal <70 01/19/2017  . Morbid obesity due to excess calories ([place]) complicated by low ERV on pfts/ hbp/hyperlipidemia 09/02/2016  . Mitral regurgitation 06/25/2016  . Ascending aorta dilatation ([place]) 06/25/2016  . Dyspnea on exertion 04/04/2016  . Sinusitis, chronic 04/04/2016  . Cough variant asthma vs UACS 04/03/2016  . Primary osteoarthritis of left knee   . DJD (degenerative joint disease) of knee 11/05/2014  . Encounter for therapeutic drug monitoring 05/07/2013  . Obstructive sleep apnea 01/20/2013  . Aortic stenosis 01/15/2013  . S/P AVR (aortic valve replacement) 01/15/2013  . Chronic diastolic CHF (congestive heart failure) ([place]) 11/14/2012  . Persistent atrial fibrillation ([place]) 07/17/2012  . Essential hypertension 07/17/2012    Past Surgical History:  Procedure Laterality Date  . BURR HOLE FOR SUBDURAL HEMATOMA  2006  . CARDIAC VALVE REPLACEMENT  2006   St. Jude AVR  . CARDIOVERSION N/A 07/22/2012   Procedure: CARDIOVERSION;  Surgeon: [name], MD;  Location[facility] ENDOSCOPY;  Service: Cardiovascular;  Laterality: N/A;  . CARDIOVERSION N/A 11/25/2012   Procedure: CARDIOVERSION;  Surgeon: [name], MD;  Location: [facility] ENDOSCOPY;  Service: Cardiovascular;  Laterality: N/A;  . CARDIOVERSION N/A 12/30/2012   Procedure: CARDIOVERSION;  Surgeon: [name], MD;  Location: [facility] ENDOSCOPY;  Service: Cardiovascular;  Laterality: N/A;  h&p in file-HW   . CARDIOVERSION N/A 03/30/2013   Procedure: CARDIOVERSION;  Surgeon: [name], MD;  Location:  [facility] ENDOSCOPY;  Service: Cardiovascular;  Laterality: N/A;  . ELECTROPHYSIOLOGIC STUDY  11/2013   Afib ablation x 2 (11/2013 and 10/2015) at [facility] by Dr Clyda Hurdle with recurrence post ablation  . KNEE ARTHROSCOPY Left 1980's   "2" (11/12/2012)  . KNEE SURGERY Left 1980's   "after 2 scopes they went in and did some kind of OR" (11/12/2012)  . TEE WITHOUT CARDIOVERSION N/A 07/22/2012   Procedure: TRANSESOPHAGEAL ECHOCARDIOGRAM (TEE);  Surgeon: [name], MD;  Location: [facility] ENDOSCOPY;  Service: Cardiovascular;  Laterality: N/A;  Rm 2034  . TEE WITHOUT CARDIOVERSION N/A 10/07/2013   Procedure: TRANSESOPHAGEAL ECHOCARDIOGRAM (TEE);  Surgeon: [name], MD;  Location: [facility] ENDOSCOPY;  Service: Cardiovascular;  Laterality: N/A;  . TOTAL KNEE ARTHROPLASTY Left 11/05/2014   Procedure: TOTAL KNEE ARTHROPLASTY;  Surgeon: Dorna Leitz, MD;  Location: [place];  Service: Orthopedics;  Laterality: Left;  . TUBAL LIGATION  1980's     OB History   None      Home Medications    Prior to Admission medications   Medication Sig Start Date End Date Taking? Authorizing Provider  ALPRAZolam Duanne Moron) 0.5 MG tablet Take 0.5 mg by mouth 2 (two) times daily as needed. AS NEEDED FOR ANXIETY 01/11/17  Yes [provider]  amiodarone (PACERONE) 200 MG tablet TAKE 1 TABLET BY MOUTH DAILY 04/08/17  Yes Turner, Eber Hong, MD  amoxicillin (AMOXIL) 500 MG capsule Take 2 capsules by mouth every 8 (eight) hours. 07/05/17  Yes [provider]  Ascorbic Acid (VITAMIN C) 1000 MG tablet Take 2,000 mg by mouth daily.   Yes [provider]  calcium-vitamin D (OSCAL WITH D) 500-200 MG-UNIT per tablet Take 2 tablets by mouth daily.    Yes [provider]  Cholecalciferol (VITAMIN D) 2000 UNITS tablet Take 4,000 Units by mouth daily.   Yes [provider]  diazepam (VALIUM) 10 MG tablet Take 10 mg by mouth every 6 (six) hours as needed for anxiety.   Yes [provider]  DILT-XR 240 MG 24 hr  capsule TAKE 1 CAPSULE BY MOUTH EVERY MORNING ON AN EMPTY STOMACH 02/08/17  Yes Turner, Eber Hong, MD  DULoxetine (CYMBALTA) 60 MG capsule Take 60 mg by mouth daily.   Yes [provider]  furosemide (LASIX) 80 MG tablet Take 1 tablet (80 mg total) by mouth daily. 06/19/17  Yes Turner, Eber Hong, MD  levothyroxine (SYNTHROID, LEVOTHROID) 100 MCG tablet Take 100 mcg daily before breakfast by mouth.   Yes [provider]  losartan (COZAAR) 100 MG tablet Take 1 tablet (100 mg total) by mouth daily. 03/11/17  Yes Turner, Eber Hong, MD  mometasone-formoterol (DULERA) 100-5 MCG/ACT AERO Inhale 2 puffs into the lungs 2 (two) times daily. 04/25/17  Yes Tanda Rockers, MD  Multiple Vitamin (MULTIVITAMIN WITH MINERALS) TABS Take 1 tablet by mouth daily.   Yes [provider]  potassium chloride SA (K-DUR,KLOR-CON) 20 MEQ tablet TAKE 2 TABLETS BY MOUTH EVERY MORNING AND 1 TABLET  EVERY EVENING 06/19/17  Yes Turner, Eber Hong, MD  pravastatin (PRAVACHOL) 10 MG tablet Take 10 mg by mouth daily.   Yes [provider]  tretinoin (RETIN-A) 0.05 % cream Apply 1 application topically at bedtime.    Yes [provider]  warfarin (COUMADIN) 4 MG tablet TAKE AS DIRECTED PER COUMADIN CLINIC. Patient taking differently: Take 4 mg by mouth once a day except for Wednesday. On Wednesday, take 2 mg by mouth once a day. 04/15/17  Yes Turner, Eber Hong, MD  zolpidem (AMBIEN) 10 MG tablet Take 5 mg by mouth at bedtime as needed for sleep.   Yes [provider]  benzonatate (TESSALON) 100 MG capsule Take 1 capsule (100 mg total) by mouth 3 (three) times daily as needed for cough. Patient not taking: Reported on 07/11/2017 05/18/17   Shelda Pal, DO  budesonide-formoterol [facility]) 80-4.5 MCG/ACT inhaler Inhale 2 puffs into the lungs 2 (two) times daily. 01/22/17   Tanda Rockers, MD    Family History Family History  Problem Relation Age of Onset  . Lung cancer Mother   .  Hypertension Mother   . Hyperlipidemia Father   . Hypertension Father   . Heart disease Brother     Social History Social History   Tobacco Use  . Smoking status: Never Smoker  . Smokeless tobacco: Never Used  Substance Use Topics  . Alcohol use: No    Comment: 11/12/2012 "no alcohol in the last 9 years"  . Drug use: No     Allergies   Codeine; Beta adrenergic blockers; Metoprolol; Propoxyphene; Toprol xl [metoprolol tartrate]; and Dilantin [phenytoin sodium extended]   Review of Systems Review of Systems  Constitutional: Negative for chills and fever.  HENT: Negative for congestion and rhinorrhea.   Eyes: Negative for redness and visual disturbance.  Respiratory: Negative for shortness of breath and wheezing.   Cardiovascular: Negative for chest pain and palpitations.  Gastrointestinal: Positive for abdominal pain, constipation and nausea. Negative for vomiting.  Genitourinary: Negative for dysuria and urgency.  Musculoskeletal: Negative for arthralgias and myalgias.  Skin: Negative for pallor and wound.  Neurological: Negative for dizziness and headaches.     Physical Exam Updated Vital Signs BP (!) 145/73 (BP Location: Left Arm)   Pulse 73   Temp 98.7 F (37.1 C) (Oral)   Resp 20   SpO2 98%   Physical Exam  Constitutional: She is oriented to person, place, and time. She appears well-developed and well-nourished. No distress.  HENT:  Head: Normocephalic and atraumatic.  Eyes: Pupils are equal, round, and reactive to light. EOM are normal.  Neck: Normal range of motion. Neck supple.  Cardiovascular: Normal rate and regular rhythm. Exam reveals no gallop and no friction rub.  No murmur heard. Pulmonary/Chest: Effort normal. She has no wheezes. She has no rales.  Abdominal: Soft. She exhibits no distension and no mass. There is tenderness (worst to the RLQ). There is guarding.  Musculoskeletal: She exhibits no edema or tenderness.  Neurological: She is alert and  oriented to person, place, and time.  Skin: Skin is warm and dry. She is not diaphoretic.  Psychiatric: She has a normal mood and affect. Her behavior is normal.  Nursing note and vitals reviewed.    ED Treatments / Results  Labs (all labs ordered are listed, but only abnormal results are displayed) Labs Reviewed  CBC WITH DIFFERENTIAL/PLATELET - Abnormal; Notable for the following components:      Result Value   Hemoglobin  11.7 (*)    All other components within normal limits  COMPREHENSIVE METABOLIC PANEL - Abnormal; Notable for the following components:   Glucose, Bld 117 (*)    All other components within normal limits  PROTIME-INR - Abnormal; Notable for the following components:   Prothrombin Time 21.8 (*)    All other components within normal limits  I-STAT CHEM 8, ED - Abnormal; Notable for the following components:   Chloride 100 (*)    Glucose, Bld 116 (*)    All other components within normal limits  LIPASE, BLOOD  URINALYSIS, ROUTINE W REFLEX MICROSCOPIC    EKG None  Radiology Ct Abdomen Pelvis W Contrast  Result Date: 07/11/2017 CLINICAL DATA:  Right lower quadrant pain EXAM: CT ABDOMEN AND PELVIS WITH CONTRAST TECHNIQUE: Multidetector CT imaging of the abdomen and pelvis was performed using the standard protocol following bolus administration of intravenous contrast. CONTRAST:  179mL ISOVUE-300 IOPAMIDOL (ISOVUE-300) INJECTION 61% COMPARISON:  10/23/2016 virtual colonoscopy. FINDINGS: Lower chest: 7 mm nodule identified medial right costophrenic sulcus (image 34/4). Hepatobiliary: 12 mm low-density lesion dome of left liver is stable and compatible with cyst. Liver otherwise unremarkable. 3.1 cm calcified gallstone evident. No intrahepatic or extrahepatic biliary dilation. Pancreas: No focal mass lesion. No dilatation of the main duct. No intraparenchymal cyst. No peripancreatic edema. Spleen: No splenomegaly. No focal mass lesion. Adrenals/Urinary Tract: No adrenal  nodule or mass. 6.1 cm cyst in the upper pole right kidney is similar to prior. Right kidney otherwise unremarkable. Left kidney unremarkable. No evidence for hydroureter. Extrinsic mass-effect on the bladder which is otherwise unremarkable. Stomach/Bowel: Small hiatal hernia. Duodenum is normally positioned as is the ligament of Treitz. No small bowel wall thickening. No small bowel dilatation. The terminal ileum is normal. The appendix is not visualized, but there is no edema or inflammation in the region of the cecum. Small metallic foreign body in the cecum compatible with ingested foreign body. Diverticular changes are noted in the left colon without evidence of diverticulitis. Vascular/Lymphatic: There is abdominal aortic atherosclerosis without aneurysm. Mild haziness in the central small bowel mesentery is nonspecific. There is no gastrohepatic or hepatoduodenal ligament lymphadenopathy. No intraperitoneal or retroperitoneal lymphadenopathy. No pelvic sidewall lymphadenopathy. Reproductive: The uterus has normal CT imaging appearance. There is no adnexal mass. Other: No intraperitoneal free fluid. Musculoskeletal: 15.6 x 9.7 x 6.2 cm heterogeneous mass is identified in the lower right rectus sheath. Imaging features most suggestive of rectus sheath hematoma. There is a tiny focus of hyper attenuation (image 64 series 2 and coronal image 37 series 5) suggesting continued extravasation/bleeding. There is some edema/hemorrhage in the extraperitoneal anterior pelvis. The hematoma generates mass-effect on the bladder. Small bilateral groin hernias contain only fat. Degenerative disc disease noted L4-5. IMPRESSION: 1. 16 x 10 x 6 cm heterogeneous mass in the lower right rectus sheath has CT imaging features most consistent with rectus sheath hematoma. There is a small blush of contrast extravasation along the cranial portion of the hematoma suggesting continued bleeding. 2. Tiny metallic foreign body in the cecum,  likely ingested. 3. Cholelithiasis. 4. Hepatic and right renal cyst. 5. 7 mm right lower lobe pulmonary nodule. Non-contrast chest CT at 6-12 months is recommended. If the nodule is stable at time of repeat CT, then future CT at 18-24 months (from today's scan) is considered optional for low-risk patients, but is recommended for high-risk patients. This recommendation follows the consensus statement: Guidelines for Management of Incidental Pulmonary Nodules Detected on CT Images: From the  Fleischner Society 2017; Radiology 2017; 284:228-243. 6.  Aortic Atherosclerois (ICD10-170.0) I personally discussed the presence of the rectus sheath hematoma with contrast extravasation by telephone with Dr. Tyrone Nine at 1858 hours on 07/11/2017. Electronically Signed   By: Misty Stanley M.D.   On: 07/11/2017 18:58    Procedures Procedures (including critical care time)  Medications Ordered in ED Medications  iopamidol (ISOVUE-300) 61 % injection (has no administration in time range)  morphine 2 MG/ML injection 2 mg (2 mg Intravenous Given 07/11/17 1626)  ondansetron (ZOFRAN) injection 4 mg (4 mg Intravenous Given 07/11/17 1626)  sodium chloride 0.9 % bolus 1,000 mL (1,000 mLs Intravenous New Bag/Given 07/11/17 1626)  iopamidol (ISOVUE-300) 61 % injection 100 mL (100 mLs Intravenous Contrast Given 07/11/17 1823)     Initial Impression / Assessment and Plan / ED Course  I have reviewed the triage vital signs and the nursing notes.  Pertinent labs & imaging results that were available during my care of the patient were reviewed by me and considered in my medical decision making (see chart for details).     72 yo F with a chief complaint of right lower quadrant abdominal pain.  Will obtain labs urine CT.  CT with R sided rectus sheath hematoma.  Discussed with [place], gen surgery. Recommended medical admission and holding Coumadin.  The patients results and plan were reviewed and discussed.   Any x-rays performed  were independently reviewed by myself.   Differential diagnosis were considered with the presenting HPI.  Medications  iopamidol (ISOVUE-300) 61 % injection (has no administration in time range)  morphine 2 MG/ML injection 2 mg (2 mg Intravenous Given 07/11/17 1626)  ondansetron (ZOFRAN) injection 4 mg (4 mg Intravenous Given 07/11/17 1626)  sodium chloride 0.9 % bolus 1,000 mL (1,000 mLs Intravenous New Bag/Given 07/11/17 1626)  iopamidol (ISOVUE-300) 61 % injection 100 mL (100 mLs Intravenous Contrast Given 07/11/17 1823)    Vitals:   07/11/17 1557 07/11/17 1614  BP:  (!) 145/73  Pulse:  73  Resp:  20  Temp:  98.7 F (37.1 C)  TempSrc:  Oral  SpO2: 96% 98%    Final diagnoses:  Rectus sheath hematoma, initial encounter    Admission/ observation were discussed with the admitting physician, patient and/or family and they are comfortable with the plan.      Final Clinical Impressions(s) / ED Diagnoses   Final diagnoses:  Rectus sheath hematoma, initial encounter    ED Discharge Orders    None       Deno Etienne, DO 07/11/17 1921

## 2017-07-11 NOTE — ED Notes (Signed)
Patient transported to CT 

## 2017-07-11 NOTE — ED Notes (Signed)
Bed: QJ19 Expected date:  Expected time:  Means of arrival:  Comments: EMS-abdominal pain

## 2017-07-11 NOTE — ED Notes (Signed)
ED TO INPATIENT HANDOFF REPORT  Name/Age/Gender [name] 72 y.o. female  Code Status Code Status History    Date Active Date Inactive Code Status Order ID Comments User Context   11/05/2014 1125 11/13/2014 1928 Full Code [id number]  Allen Norris, PA-C Inpatient    Advance Directive Documentation     Most Recent Value  Type of Advance Directive  Living will  Pre-existing out of facility DNR order (yellow form or pink MOST form)  -  "MOST" Form in Place?  -      Home/SNF/Other Home  Chief Complaint abdominal pains  Level of Care/Admitting Diagnosis ED Disposition    ED Disposition Condition [place]: [place] [[id number]]  Level of Care: Telemetry [5]  Admit to tele based on following criteria: Other see comments  Comments: afib  Diagnosis: Hematoma [518841]  Admitting Physician: Jani Gravel [[id number]]  Attending Physician: Jani Gravel [[id number]]  PT Class (Do Not Modify): Observation [104]  PT Acc Code (Do Not Modify): Observation [10022]       Medical History Past Medical History:  Diagnosis Date  . Anxiety   . Aortic stenosis    status post aortic valve replacement with [employer]. Jude mechanical prosthesis  . 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   54m by echo 06/2016 and 444mby CT angin 2016  . Chronic anticoagulation   . 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 Depression   . DJD (degenerative joint disease) of knee   . Dyslipidemia   . GERD (gastroesophageal reflux disease)    "several years ago; none since" (11/12/2012)  . Hyperlipidemia   . Hyperlipidemia LDL goal <70 01/19/2017  . Hypertension   . Left atrial enlargement   . Mitral regurgitation 06/25/2016   Mild moderate MR by echo 06/2016  . Obesity   . Persistent atrial fibrillation (HCC)    [facility]. Sleep apnea    On CPAP at 12cm H2O  . 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  shortly after AVR - now cleared by neurosurgery to maintain INR 2-2.5    Allergies Allergies  Allergen Reactions  . Codeine Anaphylaxis    Jerking, involuntary jerking   . Beta Adrenergic Blockers     Makes her crazy Makes her crazy  Makes her crazy  . Metoprolol     depression depression  . Propoxyphene Other (See Comments)    Bad vivid dreams Bad vivid dreams  . Toprol Xl [Metoprolol Tartrate]     depression  . Dilantin [Phenytoin Sodium Extended] Rash and Itching    IV Location/Drains/Wounds Patient Lines/Drains/Airways Status   Active Line/Drains/Airways    Name:   Placement date:   Placement time:   Site:   Days:   Peripheral IV 07/11/17 Left Forearm   07/11/17    1558    Forearm   less than 1   Incision (Closed) 11/05/14 Knee Left   11/05/14    1004     979          Labs/Imaging Results for orders placed or performed during the hospital encounter of 07/11/17 (from the past 48 hour(s))  CBC with Differential     Status: Abnormal   Collection Time: 07/11/17  4:25 PM  Result Value Ref Range   WBC 7.4 4.0 - 10.5 K/uL   RBC 4.04 3.87 - 5.11 MIL/uL  Hemoglobin 11.7 (L) 12.0 - 15.0 g/dL   HCT 36.7 36.0 - 46.0 %   MCV 90.8 78.0 - 100.0 fL   MCH 29.0 26.0 - 34.0 pg   MCHC 31.9 30.0 - 36.0 g/dL   RDW 14.4 11.5 - 15.5 %   Platelets 324 150 - 400 K/uL   Neutrophils Relative % 75 %   Neutro Abs 5.6 1.7 - 7.7 K/uL   Lymphocytes Relative 14 %   Lymphs Abs 1.0 0.7 - 4.0 K/uL   Monocytes Relative 9 %   Monocytes Absolute 0.6 0.1 - 1.0 K/uL   Eosinophils Relative 2 %   Eosinophils Absolute 0.1 0.0 - 0.7 K/uL   Basophils Relative 0 %   Basophils Absolute 0.0 0.0 - 0.1 K/uL    Comment: Performed at [facility], [place] [address] [place], [place] [address]  Comprehensive metabolic panel     Status: Abnormal   Collection Time: 07/11/17  4:25 PM  Result Value Ref Range   Sodium 141 135 - 145 mmol/L   Potassium 4.2 3.5 - 5.1 mmol/L   Chloride 103  101 - 111 mmol/L   CO2 29 22 - 32 mmol/L   Glucose, Bld 117 (H) 65 - 99 mg/dL   BUN 15 6 - 20 mg/dL   Creatinine, Ser 0.82 0.44 - 1.00 mg/dL   Calcium 9.1 8.9 - 10.3 mg/dL   Total Protein 7.4 6.5 - 8.1 g/dL   Albumin 3.7 3.5 - 5.0 g/dL   AST 25 15 - 41 U/L   ALT 22 14 - 54 U/L   Alkaline Phosphatase 100 38 - 126 U/L   Total Bilirubin 0.5 0.3 - 1.2 mg/dL   GFR calc non Af Amer >60 >60 mL/min   GFR calc Af Amer >60 >60 mL/min    Comment: (NOTE) The eGFR has been calculated using the CKD EPI equation. This calculation has not been validated in all clinical situations. eGFR's persistently <60 mL/min signify possible Chronic Kidney Disease.    Anion gap 9 5 - 15    Comment: Performed at [facility], [place] [address] [place], [place] [address]  Lipase, blood     Status: None   Collection Time: 07/11/17  4:25 PM  Result Value Ref Range   Lipase 20 11 - 51 U/L    Comment: Performed at  [facility], [place] [address] [place], [place] [address]  Urinalysis, Routine w reflex microscopic     Status: None   Collection Time: 07/11/17  4:25 PM  Result Value Ref Range   Color, Urine YELLOW YELLOW   APPearance CLEAR CLEAR   Specific Gravity, Urine 1.008 1.005 - 1.030   pH 8.0 5.0 - 8.0   Glucose, UA NEGATIVE NEGATIVE mg/dL   Hgb urine dipstick NEGATIVE NEGATIVE   Bilirubin Urine NEGATIVE NEGATIVE   Ketones, ur NEGATIVE NEGATIVE mg/dL   Protein, ur NEGATIVE NEGATIVE mg/dL   Nitrite NEGATIVE NEGATIVE   Leukocytes, UA NEGATIVE NEGATIVE    Comment: Performed at [facility], [place] [address], [place], [place] [address]  Protime-INR     Status: Abnormal   Collection Time: 07/11/17  4:25 PM  Result Value Ref Range   Prothrombin Time 21.8 (H) 11.4 - 15.2 seconds   INR 1.92     Comment: Performed at [facility], [place] [address] [place], [place] [address]  I-Stat Chem 8, ED     Status: Abnormal   Collection Time: 07/11/17  4:33 PM  Result Value Ref Range   Sodium 140 135 - 145 mmol/L   Potassium 4.2 3.5 - 5.1 mmol/L   Chloride 100 (L) 101 - 111 mmol/L   BUN 15 6 - 20 mg/dL   Creatinine, Ser 0.80 0.44 - 1.00 mg/dL   Glucose, Bld 116 (H) 65 - 99 mg/dL   Calcium, Ion 1.17 1.15 - 1.40 mmol/L   TCO2 30 22 - 32 mmol/L   Hemoglobin 12.2 12.0 - 15.0 g/dL   HCT 36.0 36.0 - 46.0 %   Ct Abdomen Pelvis W Contrast  Result Date: 07/11/2017 CLINICAL DATA:  Right lower quadrant pain EXAM: CT ABDOMEN AND PELVIS WITH CONTRAST TECHNIQUE: Multidetector CT imaging of the abdomen and pelvis was performed using the standard protocol following bolus administration of intravenous contrast. CONTRAST:  126m ISOVUE-300 IOPAMIDOL (ISOVUE-300) INJECTION 61% COMPARISON:  10/23/2016 virtual colonoscopy. FINDINGS: Lower chest: 7 mm nodule identified medial right costophrenic sulcus (image 34/4). Hepatobiliary: 12 mm low-density lesion dome of left liver is stable and compatible with cyst. Liver otherwise unremarkable. 3.1 cm calcified gallstone evident. No intrahepatic or extrahepatic biliary dilation. Pancreas: No focal mass lesion. No dilatation of the main duct. No intraparenchymal cyst. No peripancreatic edema. Spleen: No splenomegaly. No focal mass lesion. Adrenals/Urinary Tract: No adrenal nodule or mass. 6.1 cm cyst in the upper pole right kidney is similar to prior. Right kidney otherwise unremarkable. Left kidney unremarkable. No evidence for hydroureter. Extrinsic mass-effect on the bladder which is otherwise unremarkable. Stomach/Bowel: Small hiatal hernia. Duodenum is normally positioned as is the ligament of Treitz. No small bowel wall thickening. No small bowel dilatation. The terminal ileum is normal. The appendix is not visualized, but there is no edema or inflammation in the region of the cecum. Small metallic foreign body in the cecum compatible with ingested foreign body. Diverticular changes are noted in the left colon without  evidence of diverticulitis. Vascular/Lymphatic: There is abdominal aortic atherosclerosis without aneurysm. Mild haziness in the central small bowel mesentery is nonspecific. There is no gastrohepatic or hepatoduodenal ligament lymphadenopathy. No intraperitoneal or retroperitoneal lymphadenopathy. No pelvic sidewall lymphadenopathy. Reproductive: The uterus has normal CT imaging appearance. There is no adnexal mass. Other: No intraperitoneal free fluid. Musculoskeletal: 15.6 x 9.7 x 6.2 cm heterogeneous mass is identified in the lower right rectus sheath. Imaging features most suggestive of rectus sheath hematoma. There is a tiny focus of hyper attenuation (image 64 series 2 and coronal image 37 series 5) suggesting continued extravasation/bleeding. There is some edema/hemorrhage in the extraperitoneal anterior pelvis. The hematoma generates mass-effect on the bladder. Small bilateral groin hernias contain only fat. Degenerative disc disease noted L4-5. IMPRESSION: 1. 16 x 10 x 6 cm heterogeneous mass in the lower right rectus sheath has CT imaging features most consistent with rectus sheath hematoma. There is a small blush of contrast extravasation along the cranial portion of the hematoma suggesting continued bleeding. 2. Tiny metallic foreign body in the cecum, likely ingested. 3. Cholelithiasis. 4. Hepatic and right renal cyst. 5. 7 mm right lower lobe pulmonary nodule. Non-contrast chest CT at 6-12 months is recommended. If the nodule is stable at time of repeat CT, then future CT at 18-24 months (from today's scan) is considered optional for low-risk patients, but is recommended for high-risk patients. This recommendation follows the consensus statement: Guidelines for Management of Incidental Pulmonary Nodules Detected on CT Images: From the Fleischner Society 2017; Radiology 2017; 284:228-243. 6.  Aortic Atherosclerois (ICD10-170.0) I personally discussed the presence of  the rectus sheath hematoma with  contrast extravasation by telephone with Dr. Tyrone Nine at 1858 hours on 07/11/2017. Electronically Signed   By: Misty Stanley M.D.   On: 07/11/2017 18:58    Pending Labs Unresulted Labs (From admission, onward)   None      Vitals/Pain Today's Vitals   07/11/17 1657 07/11/17 1900 07/11/17 1930 07/11/17 1947  BP:  (!) 141/60 119/63 139/70  Pulse:  71 70 71  Resp:    (!) 21  Temp:      TempSrc:      SpO2:  98% 98% 98%  PainSc: 2        Isolation Precautions No active isolations  Medications Medications  iopamidol (ISOVUE-300) 61 % injection (has no administration in time range)  morphine 2 MG/ML injection 2 mg (2 mg Intravenous Given 07/11/17 1626)  ondansetron (ZOFRAN) injection 4 mg (4 mg Intravenous Given 07/11/17 1626)  sodium chloride 0.9 % bolus 1,000 mL (0 mLs Intravenous Stopped 07/11/17 1657)  iopamidol (ISOVUE-300) 61 % injection 100 mL (100 mLs Intravenous Contrast Given 07/11/17 1823)    Mobility walks with device

## 2017-07-12 ENCOUNTER — Encounter (HOSPITAL_COMMUNITY): Payer: Self-pay | Admitting: Cardiology

## 2017-07-12 DIAGNOSIS — D62 Acute posthemorrhagic anemia: Secondary | ICD-10-CM | POA: Diagnosis not present

## 2017-07-12 DIAGNOSIS — I480 Paroxysmal atrial fibrillation: Secondary | ICD-10-CM | POA: Diagnosis not present

## 2017-07-12 DIAGNOSIS — K5900 Constipation, unspecified: Secondary | ICD-10-CM

## 2017-07-12 DIAGNOSIS — S301XXA Contusion of abdominal wall, initial encounter: Secondary | ICD-10-CM | POA: Diagnosis not present

## 2017-07-12 DIAGNOSIS — T148XXA Other injury of unspecified body region, initial encounter: Secondary | ICD-10-CM | POA: Diagnosis not present

## 2017-07-12 DIAGNOSIS — I481 Persistent atrial fibrillation: Secondary | ICD-10-CM | POA: Diagnosis not present

## 2017-07-12 DIAGNOSIS — Z7901 Long term (current) use of anticoagulants: Secondary | ICD-10-CM | POA: Diagnosis not present

## 2017-07-12 LAB — CBC
HCT: 35.3 % — ABNORMAL LOW (ref 36.0–46.0)
Hemoglobin: 11 g/dL — ABNORMAL LOW (ref 12.0–15.0)
MCH: 28.9 pg (ref 26.0–34.0)
MCHC: 31.2 g/dL (ref 30.0–36.0)
MCV: 92.9 fL (ref 78.0–100.0)
Platelets: 303 10*3/uL (ref 150–400)
RBC: 3.8 MIL/uL — AB (ref 3.87–5.11)
RDW: 14.8 % (ref 11.5–15.5)
WBC: 8.6 10*3/uL (ref 4.0–10.5)

## 2017-07-12 LAB — COMPREHENSIVE METABOLIC PANEL
ALBUMIN: 3.4 g/dL — AB (ref 3.5–5.0)
ALK PHOS: 92 U/L (ref 38–126)
ALT: 21 U/L (ref 14–54)
AST: 21 U/L (ref 15–41)
Anion gap: 9 (ref 5–15)
BUN: 12 mg/dL (ref 6–20)
CHLORIDE: 102 mmol/L (ref 101–111)
CO2: 28 mmol/L (ref 22–32)
CREATININE: 0.79 mg/dL (ref 0.44–1.00)
Calcium: 8.7 mg/dL — ABNORMAL LOW (ref 8.9–10.3)
GFR calc non Af Amer: >60 mL/min (ref >60)
GLUCOSE: 137 mg/dL — AB (ref 65–99)
Potassium: 3.7 mmol/L (ref 3.5–5.1)
SODIUM: 139 mmol/L (ref 135–145)
Total Bilirubin: 0.7 mg/dL (ref 0.3–1.2)
Total Protein: 6.9 g/dL (ref 6.5–8.1)

## 2017-07-12 LAB — PROTIME-INR
INR: 1.93
Prothrombin Time: 21.9 seconds — ABNORMAL HIGH (ref 11.4–15.2)

## 2017-07-12 MED ORDER — TRAMADOL HCL 50 MG PO TABS
50.0000 mg | ORAL_TABLET | Freq: Four times a day (QID) | ORAL | Status: DC | PRN
  Administered 2017-07-12 (×3): 50 mg via ORAL
  Filled 2017-07-12 (×3): qty 1

## 2017-07-12 MED ORDER — DOCUSATE SODIUM 100 MG PO CAPS
100.0000 mg | ORAL_CAPSULE | Freq: Two times a day (BID) | ORAL | Status: DC
  Administered 2017-07-12 – 2017-07-15 (×7): 100 mg via ORAL
  Filled 2017-07-12 (×7): qty 1

## 2017-07-12 MED ORDER — POLYETHYLENE GLYCOL 3350 17 G PO PACK
17.0000 g | PACK | Freq: Every day | ORAL | Status: DC
  Administered 2017-07-12: 17 g via ORAL
  Filled 2017-07-12: qty 1

## 2017-07-12 MED ORDER — ACETAMINOPHEN 325 MG PO TABS
650.0000 mg | ORAL_TABLET | Freq: Four times a day (QID) | ORAL | Status: DC | PRN
  Administered 2017-07-12 (×2): 650 mg via ORAL
  Filled 2017-07-12 (×2): qty 2

## 2017-07-12 MED ORDER — CHLORHEXIDINE GLUCONATE 0.12 % MT SOLN
15.0000 mL | Freq: Two times a day (BID) | OROMUCOSAL | Status: DC
  Administered 2017-07-12 – 2017-07-15 (×8): 15 mL via OROMUCOSAL
  Filled 2017-07-12 (×7): qty 15

## 2017-07-12 MED ORDER — ORAL CARE MOUTH RINSE
15.0000 mL | Freq: Two times a day (BID) | OROMUCOSAL | Status: DC
  Administered 2017-07-12 – 2017-07-15 (×5): 15 mL via OROMUCOSAL

## 2017-07-12 NOTE — Progress Notes (Signed)
[facility] PROGRESS NOTE  [name] BMW:[id number] DOB: Oct 04, 1945 DOA: 07/11/2017  PCP: Maurice Small, MD  Brief History/Interval Summary: 72 year old Caucasian female with a past medical history of hypertension, hyperlipidemia, aortic stenosis status post mechanical aortic valve replacement, mitral regurgitation, paroxysmal atrial fibrillation presented with complains of abdominal pain.  Patient has been constipated for the past 3 months.  Recently seen by her primary care physician and had to be disimpacted.  She has been straining a lot as a result of her constipation.  Evaluation revealed a rectus sheath hematoma.  Patient is on anticoagulation.  She was hospitalized for further management.  Reason for Visit: Rectus sheath hematoma  Consultants: Cardiology.  Phone discussion with the general surgery by ED provider.  Procedures: None  Antibiotics: None  Subjective/Interval History: Patient continues to have pain in the right side of her abdomen.  Denies any nausea vomiting.  Last bowel movement was yesterday.  Admits to constipation for the last 3 months.  Denies any rectal bleeding.  ROS: Denies any shortness of breath.  Objective:  Vital Signs  Vitals:   07/11/17 2123 07/12/17 0500 07/12/17 0521 07/12/17 0840  BP: 134/66  139/62   Pulse: 73  75   Resp: 18  18   Temp: 98.2 F (36.8 C)  98.8 F (37.1 C)   TempSrc: Oral  Oral   SpO2: 98%  94% 92%  Weight: 104.4 kg (230 lb 2.6 oz) 105.1 kg (231 lb 11.3 oz)    Height: 5\' 7"  (1.702 m)       Intake/Output Summary (Last 24 hours) at 07/12/2017 1227 Last data filed at 07/12/2017 1015 Gross per 24 hour  Intake 1240 ml  Output 300 ml  Net 940 ml   Filed Weights   07/11/17 2123 07/12/17 0500  Weight: 104.4 kg (230 lb 2.6 oz) 105.1 kg (231 lb 11.3 oz)    General appearance: alert, cooperative, appears stated age and no distress Head: Normocephalic, without obvious abnormality, atraumatic Resp: clear to  auscultation bilaterally Cardio: S1-S2 with mechanical sound.  No obvious rubs murmurs or bruits. GI: No bruising is noted.  Abdomen is tender to palpation in the right lower quadrant.  No rebound rigidity or guarding.  Bowel sounds are present.  No obvious masses organomegaly. Extremities: extremities normal, atraumatic, no cyanosis or edema Pulses: 2+ and symmetric Neurologic: No obvious focal neurological deficit  Lab Results:  Data Reviewed: I have personally reviewed following labs and imaging studies  CBC: Recent Labs  Lab 07/11/17 1625 07/11/17 1633 07/12/17 0424  WBC 7.4  --  8.6  NEUTROABS 5.6  --   --   HGB 11.7* 12.2 11.0*  HCT 36.7 36.0 35.3*  MCV 90.8  --  92.9  PLT 324  --  272    Basic Metabolic Panel: Recent Labs  Lab 07/11/17 1625 07/11/17 1633 07/12/17 0424  NA 141 140 139  K 4.2 4.2 3.7  CL 103 100* 102  CO2 29  --  28  GLUCOSE 117* 116* 137*  BUN 15 15 12   CREATININE 0.82 0.80 0.79  CALCIUM 9.1  --  8.7*    GFR: Estimated Creatinine Clearance: 79.3 mL/min (by C-G formula based on SCr of 0.79 mg/dL).  Liver Function Tests: Recent Labs  Lab 07/11/17 1625 07/12/17 0424  AST 25 21  ALT 22 21  ALKPHOS 100 92  BILITOT 0.5 0.7  PROT 7.4 6.9  ALBUMIN 3.7 3.4*    Recent Labs  Lab 07/11/17 1625  LIPASE 20   Coagulation Profile: Recent Labs  Lab 07/11/17 1625 07/12/17 0424  INR 1.92 1.93     Radiology Studies: Ct Abdomen Pelvis W Contrast  Result Date: 07/11/2017 CLINICAL DATA:  Right lower quadrant pain EXAM: CT ABDOMEN AND PELVIS WITH CONTRAST TECHNIQUE: Multidetector CT imaging of the abdomen and pelvis was performed using the standard protocol following bolus administration of intravenous contrast. CONTRAST:  128mL ISOVUE-300 IOPAMIDOL (ISOVUE-300) INJECTION 61% COMPARISON:  10/23/2016 virtual colonoscopy. FINDINGS: Lower chest: 7 mm nodule identified medial right costophrenic sulcus (image 34/4). Hepatobiliary: 12 mm low-density  lesion dome of left liver is stable and compatible with cyst. Liver otherwise unremarkable. 3.1 cm calcified gallstone evident. No intrahepatic or extrahepatic biliary dilation. Pancreas: No focal mass lesion. No dilatation of the main duct. No intraparenchymal cyst. No peripancreatic edema. Spleen: No splenomegaly. No focal mass lesion. Adrenals/Urinary Tract: No adrenal nodule or mass. 6.1 cm cyst in the upper pole right kidney is similar to prior. Right kidney otherwise unremarkable. Left kidney unremarkable. No evidence for hydroureter. Extrinsic mass-effect on the bladder which is otherwise unremarkable. Stomach/Bowel: Small hiatal hernia. Duodenum is normally positioned as is the ligament of Treitz. No small bowel wall thickening. No small bowel dilatation. The terminal ileum is normal. The appendix is not visualized, but there is no edema or inflammation in the region of the cecum. Small metallic foreign body in the cecum compatible with ingested foreign body. Diverticular changes are noted in the left colon without evidence of diverticulitis. Vascular/Lymphatic: There is abdominal aortic atherosclerosis without aneurysm. Mild haziness in the central small bowel mesentery is nonspecific. There is no gastrohepatic or hepatoduodenal ligament lymphadenopathy. No intraperitoneal or retroperitoneal lymphadenopathy. No pelvic sidewall lymphadenopathy. Reproductive: The uterus has normal CT imaging appearance. There is no adnexal mass. Other: No intraperitoneal free fluid. Musculoskeletal: 15.6 x 9.7 x 6.2 cm heterogeneous mass is identified in the lower right rectus sheath. Imaging features most suggestive of rectus sheath hematoma. There is a tiny focus of hyper attenuation (image 64 series 2 and coronal image 37 series 5) suggesting continued extravasation/bleeding. There is some edema/hemorrhage in the extraperitoneal anterior pelvis. The hematoma generates mass-effect on the bladder. Small bilateral groin  hernias contain only fat. Degenerative disc disease noted L4-5. IMPRESSION: 1. 16 x 10 x 6 cm heterogeneous mass in the lower right rectus sheath has CT imaging features most consistent with rectus sheath hematoma. There is a small blush of contrast extravasation along the cranial portion of the hematoma suggesting continued bleeding. 2. Tiny metallic foreign body in the cecum, likely ingested. 3. Cholelithiasis. 4. Hepatic and right renal cyst. 5. 7 mm right lower lobe pulmonary nodule. Non-contrast chest CT at 6-12 months is recommended. If the nodule is stable at time of repeat CT, then future CT at 18-24 months (from today's scan) is considered optional for low-risk patients, but is recommended for high-risk patients. This recommendation follows the consensus statement: Guidelines for Management of Incidental Pulmonary Nodules Detected on CT Images: From the Fleischner Society 2017; Radiology 2017; 284:228-243. 6.  Aortic Atherosclerois (ICD10-170.0) I personally discussed the presence of the rectus sheath hematoma with contrast extravasation by telephone with Dr. Tyrone Nine at 1858 hours on 07/11/2017. Electronically Signed   By: Misty Stanley M.D.   On: 07/11/2017 18:58     Medications:  Scheduled: . amiodarone  200 mg Oral Daily  . chlorhexidine  15 mL Mouth Rinse BID  . diltiazem  240 mg Oral Daily  . docusate sodium  100 mg  Oral BID  . DULoxetine  60 mg Oral Daily  . furosemide  80 mg Oral Daily  . levothyroxine  100 mcg Oral QAC breakfast  . losartan  100 mg Oral Daily  . mouth rinse  15 mL Mouth Rinse q12n4p  . mometasone-formoterol  2 puff Inhalation BID  . polyethylene glycol  17 g Oral Daily  . potassium chloride  20 mEq Oral QHS  . potassium chloride SA  40 mEq Oral Daily  . pravastatin  10 mg Oral Daily  . sodium chloride flush  3 mL Intravenous Q12H  . tretinoin  1 application Topical QHS   Continuous: . sodium chloride     FMB:WGYKZL chloride, acetaminophen, diazepam, sodium  chloride flush, traMADol, zolpidem  Assessment/Plan:  Principal Problem:   Hematoma Active Problems:   Persistent atrial fibrillation (HCC)   Essential hypertension   Rectus sheath hematoma, initial encounter    Rectus sheath hematoma Most likely a result of straining from constipation. She also had recent pneumonia with severe coughing episodes which could have also contributed.  No significant drop in hemoglobin noted.  These findings were discussed with general surgery who did not recommend any surgical intervention and only recommended monitoring for warfarin was placed on hold.  History of aortic stenosis status post aortic valve replacement with mechanical valve This was done in 2006.  Stable.  Has been anticoagulated with warfarin.  Cardiology has been consulted to assist with the anticoagulation issue.  Would ideally like to hold warfarin until her bleeding subsides.  History of paroxysmal atrial fibrillation Continue home medications including Cardizem and amiodarone.  Normocytic anemia/possible acute blood loss anemia Baseline hemoglobin around 12.8.  11.0 this morning.  Continue to monitor for now.  Hyperlipidemia Continue pravastatin  History of hypothyroidism Continue levothyroxine.  History of anxiety disorder Continue Xanax  History of asthma Stable.  Continue home medications.  Constipation Apparently this has been ongoing for the last 3 months.  Etiology is unclear.  We will check TSH and free T4.  Continue with laxatives.  Unclear when was her last colonoscopy.  She may need to be referred to gastroenterology as an outpatient.  Tiny metallic foreign body in the cecum Incidentally detected on CT scan.  We will allow it to pass on its own.  Cholelithiasis/Hepatic and right renal cyst Incidentally detected on CT scan.  Outpatient monitoring.  7 mm right lower lobe pulmonary nodule Non-contrast chest CT at 6-12 months is recommended. If the nodule is  stable at time of repeat CT, then future CT at 18-24 months (from today's scan) is considered optional for low-risk patients, but is recommended for high-risk patients.   DVT Prophylaxis: Has been on warfarin.  INR is 1.9 today.    Code Status: Full code Family Communication: Discussed with the patient Disposition Plan: Management as outlined above.  Mobilize as tolerated.    LOS: 0 days   [facility] Pager [phone] 07/12/2017, 12:27 PM  If 7PM-7AM, please contact night-coverage at [email or web address], password [facility]

## 2017-07-12 NOTE — Consult Note (Addendum)
Cardiology Consultation:   Patient ID: MIO SCHELLINGER; [id number]; September 23, 1945   Admit date: 07/11/2017 Date of Consult: 07/12/2017  Primary Care Provider: Maurice Small, MD Primary Cardiologist: Fransico Him, MD  Primary Electrophysiologist:  NA   Patient Profile:   [name] is a 72 y.o. female with a hx of mechanical aortic valve--for AS, on coumadin, PAF with a fib ablation and underwent tikosyn load, repeat ablation at [facility] 10/2015 and then changed to amiodarone, maintaining SR, OSA on CPAP who is being seen today for the evaluation of hematoma and holding coumadin with mechanical aortic vave at the request of Dr. [place] Pink.  History of Present Illness:   [name] has  a hx of mechanical aortic valve--for AS, on coumadin, PAF with a fib ablation and underwent tikosyn load, repeat ablation at [facility] 10/2015 and then changed to amiodarone, maintaining SR, OSA on CPAP.  Cardiac cath in 2005 with normal coronary arteries, and symptomatic moderate aortic stenosis.  Nuc study 2017 without ischemia.    Last echo 06/2016 with EF 55-60%, no RWMA, moderate MR, LA was severely dilated, normal RV, mild TR, PA pk pressure 27 mmHg,  Ascending aortic diameter 44 mm, and transvalvular velocity mildly elevated, no regurg.    [name] was admitted 07/11/17 with RLQ pain that began at 0730.  [name] has had several months of constipation and has been bearing down, and need disimpaction the day before admit.  Pt is on coumadin for AVR with mechanical valve.  [name] also has been on antibiotics for LLL PNA, .    CT of abd with hematoma 16 X 10 X 6 cm in lower Rt rectus sheath.     Hgb 11.7 on admit and 11 this AM, WBC 7.4, plts 324. INR on admit was 1.92 and 1.93 today  Na 139, K+ 3.7, Cr 0.79, LFTs normal.    No plans for surgery currently and coumadin in on hold.   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  Had been doing well except  for constipation.    Past Medical History:  Diagnosis Date  . Anxiety   . Aortic stenosis    status post aortic valve replacement with St. Jude mechanical prosthesis  . 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   9mm by echo 06/2016 and 62mm by CT angin 2016  . Chronic anticoagulation   . 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 Depression   . DJD (degenerative joint disease) of knee   . Dyslipidemia   . GERD (gastroesophageal reflux disease)    "several years ago; none since" (11/12/2012)  . Hyperlipidemia   . Hyperlipidemia LDL goal <70 01/19/2017  . Hypertension   . Left atrial enlargement   . Mitral regurgitation 06/25/2016   Mild moderate MR by echo 06/2016  . Obesity   . Persistent atrial fibrillation (HCC)    s/p afib ablation x 2 at [facility]  . Sleep apnea    On CPAP at 12cm H2O  . 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 - now cleared by neurosurgery to maintain INR 2-2.5    Past Surgical History:  Procedure Laterality Date  . BURR HOLE FOR SUBDURAL HEMATOMA  2006  . CARDIAC VALVE REPLACEMENT  2006   St. Jude AVR  . CARDIOVERSION N/A 07/22/2012   Procedure:  CARDIOVERSION;  Surgeon: [name], MD;  Location: [facility] ENDOSCOPY;  Service: Cardiovascular;  Laterality: N/A;  . CARDIOVERSION N/A 11/25/2012   Procedure: CARDIOVERSION;  Surgeon: [name], MD;  Location: [facility] ENDOSCOPY;  Service: Cardiovascular;  Laterality: N/A;  . CARDIOVERSION N/A 12/30/2012   Procedure: CARDIOVERSION;  Surgeon: [name], MD;  Location: [facility] ENDOSCOPY;  Service: Cardiovascular;  Laterality: N/A;  h&p in file-HW   . CARDIOVERSION N/A 03/30/2013   Procedure: CARDIOVERSION;  Surgeon: [name], MD;  Location: [facility] ENDOSCOPY;  Service: Cardiovascular;  Laterality: N/A;  . ELECTROPHYSIOLOGIC STUDY  11/2013   Afib ablation x 2 (11/2013 and 10/2015) at [facility] by Dr Clyda Hurdle with recurrence post ablation  . KNEE ARTHROSCOPY Left  1980's   "2" (11/12/2012)  . KNEE SURGERY Left 1980's   "after 2 scopes they went in and did some kind of OR" (11/12/2012)  . TEE WITHOUT CARDIOVERSION N/A 07/22/2012   Procedure: TRANSESOPHAGEAL ECHOCARDIOGRAM (TEE);  Surgeon: [name], MD;  Location: [facility] ENDOSCOPY;  Service: Cardiovascular;  Laterality: N/A;  Rm 2034  . TEE WITHOUT CARDIOVERSION N/A 10/07/2013   Procedure: TRANSESOPHAGEAL ECHOCARDIOGRAM (TEE);  Surgeon: [name], MD;  Location: [facility] ENDOSCOPY;  Service: Cardiovascular;  Laterality: N/A;  . TOTAL KNEE ARTHROPLASTY Left 11/05/2014   Procedure: TOTAL KNEE ARTHROPLASTY;  Surgeon: Dorna Leitz, MD;  Location: [facility];  Service: Orthopedics;  Laterality: Left;  . TUBAL LIGATION  1980's     Home Medications:  Prior to Admission medications   Medication Sig Start Date End Date Taking? Authorizing Provider  ALPRAZolam Duanne Moron) 0.5 MG tablet Take 0.5 mg by mouth 2 (two) times daily as needed. AS NEEDED FOR ANXIETY 01/11/17  Yes [provider]  amiodarone (PACERONE) 200 MG tablet TAKE 1 TABLET BY MOUTH DAILY 04/08/17  Yes Turner, Eber Hong, MD  amoxicillin (AMOXIL) 500 MG capsule Take 2 capsules by mouth every 8 (eight) hours. 07/05/17  Yes [provider]  Ascorbic Acid (VITAMIN C) 1000 MG tablet Take 2,000 mg by mouth daily.   Yes [provider]  calcium-vitamin D (OSCAL WITH D) 500-200 MG-UNIT per tablet Take 2 tablets by mouth daily.    Yes [provider]  Cholecalciferol (VITAMIN D) 2000 UNITS tablet Take 4,000 Units by mouth daily.   Yes [provider]  diazepam (VALIUM) 10 MG tablet Take 10 mg by mouth every 6 (six) hours as needed for anxiety.   Yes [provider]  DILT-XR 240 MG 24 hr capsule TAKE 1 CAPSULE BY MOUTH EVERY MORNING ON AN EMPTY STOMACH 02/08/17  Yes Turner, Eber Hong, MD  DULoxetine (CYMBALTA) 60 MG capsule Take 60 mg by mouth daily.   Yes [provider]  furosemide (LASIX) 80 MG tablet Take 1 tablet (80 mg  total) by mouth daily. 06/19/17  Yes Turner, Eber Hong, MD  levothyroxine (SYNTHROID, LEVOTHROID) 100 MCG tablet Take 100 mcg daily before breakfast by mouth.   Yes [provider]  losartan (COZAAR) 100 MG tablet Take 1 tablet (100 mg total) by mouth daily. 03/11/17  Yes Turner, Eber Hong, MD  mometasone-formoterol (DULERA) 100-5 MCG/ACT AERO Inhale 2 puffs into the lungs 2 (two) times daily. 04/25/17  Yes Tanda Rockers, MD  Multiple Vitamin (MULTIVITAMIN WITH MINERALS) TABS Take 1 tablet by mouth daily.   Yes [provider]  potassium chloride SA (K-DUR,KLOR-CON) 20 MEQ tablet TAKE 2 TABLETS BY MOUTH EVERY MORNING AND 1 TABLET EVERY EVENING 06/19/17  Yes [name], MD  pravastatin (PRAVACHOL) 10 MG tablet Take 10 mg by mouth daily.   Yes [provider]  tretinoin (RETIN-A) 0.05 % cream Apply 1 application topically at bedtime.    Yes [provider]  warfarin (COUMADIN) 4 MG tablet TAKE AS DIRECTED PER COUMADIN CLINIC. Patient taking differently: Take 4 mg by mouth once a day except for Wednesday. On Wednesday, take 2 mg by mouth once a day. 04/15/17  Yes Turner, Eber Hong, MD  zolpidem (AMBIEN) 10 MG tablet Take 5 mg by mouth at bedtime as needed for sleep.   Yes [provider]  benzonatate (TESSALON) 100 MG capsule Take 1 capsule (100 mg total) by mouth 3 (three) times daily as needed for cough. Patient not taking: Reported on 07/11/2017 05/18/17   Shelda Pal, DO  budesonide-formoterol [facility]) 80-4.5 MCG/ACT inhaler Inhale 2 puffs into the lungs 2 (two) times daily. 01/22/17   Tanda Rockers, MD    Inpatient Medications: Scheduled Meds: . amiodarone  200 mg Oral Daily  . chlorhexidine  15 mL Mouth Rinse BID  . diltiazem  240 mg Oral Daily  . docusate sodium  100 mg Oral BID  . DULoxetine  60 mg Oral Daily  . furosemide  80 mg Oral Daily  . levothyroxine  100 mcg Oral QAC breakfast  . losartan  100 mg Oral Daily  . mouth rinse  15  mL Mouth Rinse q12n4p  . mometasone-formoterol  2 puff Inhalation BID  . polyethylene glycol  17 g Oral Daily  . potassium chloride  20 mEq Oral QHS  . potassium chloride SA  40 mEq Oral Daily  . pravastatin  10 mg Oral Daily  . sodium chloride flush  3 mL Intravenous Q12H  . tretinoin  1 application Topical QHS   Continuous Infusions: . sodium chloride     PRN Meds: sodium chloride, acetaminophen, diazepam, sodium chloride flush, traMADol, zolpidem  Allergies:    Allergies  Allergen Reactions  . Codeine Anaphylaxis    Jerking, involuntary jerking   . Beta Adrenergic Blockers     Makes [name] crazy Makes [name] crazy  Makes [name] crazy  . Metoprolol     depression depression  . Propoxyphene Other (See Comments)    Bad vivid dreams Bad vivid dreams  . Toprol Xl [Metoprolol Tartrate]     depression  . Dilantin [Phenytoin Sodium Extended] Rash and Itching    Social History:   Social History   Socioeconomic History  . Marital status: Married    Spouse name: Not on file  . Number of children: Not on file  . Years of education: Not on file  . Highest education level: Not on file  Occupational History  . Not on file  Social Needs  . Financial resource strain: Not on file  . Food insecurity:    Worry: Not on file    Inability: Not on file  . Transportation needs:    Medical: Not on file    Non-medical: Not on file  Tobacco Use  . Smoking status: Never Smoker  . Smokeless tobacco: Never Used  Substance and Sexual Activity  . Alcohol use: No    Comment: 11/12/2012 "no alcohol in the last 9 years"  . Drug use: No  . Sexual activity: Not Currently  Lifestyle  . Physical activity:    Days per week: Not on file    Minutes per session: Not on file  . Stress: Not on file  Relationships  . Social connections:  Talks on phone: Not on file    Gets together: Not on file    Attends religious service: Not on file    Active member of club or organization: Not on file     Attends meetings of clubs or organizations: Not on file    Relationship status: Not on file  . Intimate partner violence:    Fear of current or ex partner: Not on file    Emotionally abused: Not on file    Physically abused: Not on file    Forced sexual activity: Not on file  Other Topics Concern  . Not on file  Social History Narrative   Pt lives in [place] with spouse.  Retired    Family History:    Family History  Problem Relation Age of Onset  . Lung cancer Mother   . Hypertension Mother   . Hyperlipidemia Father   . Hypertension Father   . Heart disease Brother      ROS:  Please see the history of present illness.  General:no colds or fevers, no weight changes Skin:no rashes or ulcers HEENT:no blurred vision, no congestion CV:see HPI PUL:see HPI GI:no diarrhea +++constipation no melena, no indigestion GU:no hematuria, no dysuria MS:no joint pain, no claudication Neuro:no syncope, no lightheadedness Endo:no diabetes, no thyroid disease  All other ROS reviewed and negative.     Physical Exam/Data:   Vitals:   07/11/17 2123 07/12/17 0500 07/12/17 0521 07/12/17 0840  BP: 134/66  139/62   Pulse: 73  75   Resp: 18  18   Temp: 98.2 F (36.8 C)  98.8 F (37.1 C)   TempSrc: Oral  Oral   SpO2: 98%  94% 92%  Weight: 230 lb 2.6 oz (104.4 kg) 231 lb 11.3 oz (105.1 kg)    Height: 5\' 7"  (1.702 m)       Intake/Output Summary (Last 24 hours) at 07/12/2017 1004 Last data filed at 07/11/2017 1657 Gross per 24 hour  Intake 1000 ml  Output 300 ml  Net 700 ml   Filed Weights   07/11/17 2123 07/12/17 0500  Weight: 230 lb 2.6 oz (104.4 kg) 231 lb 11.3 oz (105.1 kg)   Body mass index is 36.29 kg/m.  General:  Well nourished, well developed, in no acute distress unless [name] moves HEENT: normal Lymph: no adenopathy Neck: no JVD Endocrine:  No thryomegaly Vascular: No carotid bruits; FA pulses 2+ bilaterally without bruits  Cardiac:  normal S1, S2; RRR; no murmur  gallup rub or click Lungs:  clear to auscultation bilaterally, no wheezing, rhonchi or rales  Abd: obese, soft, + Rt tenderness, no hepatomegaly  Ext: no edema Musculoskeletal:  No deformities, BUE and BLE strength normal and equal Skin: warm and dry  Neuro:  Alert and oriented X 3 MAE, follows commands, no focal abnormalities noted Psych:  Normal affect    Relevant CV Studies: Echo 06/11/17 Study Conclusions  - Left ventricle: The cavity size was mildly dilated. There was   mild concentric hypertrophy. Systolic function was normal. The   estimated ejection fraction was in the range of 55% to 60%. Wall   motion was normal; there were no regional wall motion   abnormalities. Doppler parameters are consistent with high   ventricular filling pressure. - Aortic valve: A mechanical prosthesis was present. Transvalvular   velocity mildly elevated. There was no regurgitation. Peak   velocity (S): 288 cm/s. Mean gradient (S): 20 mm Hg. - Aorta: Ascending aortic diameter: 44 mm (S). -  Ascending aorta: The ascending aorta was mildly dilated. - Mitral valve: Mildly calcified annulus. Transvalvular velocity   was within the normal range. There was no evidence for stenosis.   There was mild to moderate regurgitation. - Left atrium: The atrium was severely dilated. - Right ventricle: The cavity size was normal. Wall thickness was   normal. Systolic function was normal. - Atrial septum: No defect or patent foramen ovale was identified   by color flow Doppler. - Tricuspid valve: There was mild regurgitation. - Pulmonary arteries: Systolic pressure was within the normal   range. PA peak pressure: 27 mm Hg (S).   Laboratory Data:  Chemistry Recent Labs  Lab 07/11/17 1625 07/11/17 1633 07/12/17 0424  NA 141 140 139  K 4.2 4.2 3.7  CL 103 100* 102  CO2 29  --  28  GLUCOSE 117* 116* 137*  BUN 15 15 12   CREATININE 0.82 0.80 0.79  CALCIUM 9.1  --  8.7*  GFRNONAA >60  --  >60  GFRAA  >60  --  >60  ANIONGAP 9  --  9    Recent Labs  Lab 07/11/17 1625 07/12/17 0424  PROT 7.4 6.9  ALBUMIN 3.7 3.4*  AST 25 21  ALT 22 21  ALKPHOS 100 92  BILITOT 0.5 0.7   Hematology Recent Labs  Lab 07/11/17 1625 07/11/17 1633 07/12/17 0424  WBC 7.4  --  8.6  RBC 4.04  --  3.80*  HGB 11.7* 12.2 11.0*  HCT 36.7 36.0 35.3*  MCV 90.8  --  92.9  MCH 29.0  --  28.9  MCHC 31.9  --  31.2  RDW 14.4  --  14.8  PLT 324  --  303   Cardiac EnzymesNo results for input(s): TROPONINI in the last 168 hours. No results for input(s): TROPIPOC in the last 168 hours.  BNPNo results for input(s): BNP, PROBNP in the last 168 hours.  DDimer No results for input(s): DDIMER in the last 168 hours.  Radiology/Studies:  Ct Abdomen Pelvis W Contrast  Result Date: 07/11/2017 CLINICAL DATA:  Right lower quadrant pain EXAM: CT ABDOMEN AND PELVIS WITH CONTRAST TECHNIQUE: Multidetector CT imaging of the abdomen and pelvis was performed using the standard protocol following bolus administration of intravenous contrast. CONTRAST:  160mL ISOVUE-300 IOPAMIDOL (ISOVUE-300) INJECTION 61% COMPARISON:  10/23/2016 virtual colonoscopy. FINDINGS: Lower chest: 7 mm nodule identified medial right costophrenic sulcus (image 34/4). Hepatobiliary: 12 mm low-density lesion dome of left liver is stable and compatible with cyst. Liver otherwise unremarkable. 3.1 cm calcified gallstone evident. No intrahepatic or extrahepatic biliary dilation. Pancreas: No focal mass lesion. No dilatation of the main duct. No intraparenchymal cyst. No peripancreatic edema. Spleen: No splenomegaly. No focal mass lesion. Adrenals/Urinary Tract: No adrenal nodule or mass. 6.1 cm cyst in the upper pole right kidney is similar to prior. Right kidney otherwise unremarkable. Left kidney unremarkable. No evidence for hydroureter. Extrinsic mass-effect on the bladder which is otherwise unremarkable. Stomach/Bowel: Small hiatal hernia. Duodenum is normally  positioned as is the ligament of Treitz. No small bowel wall thickening. No small bowel dilatation. The terminal ileum is normal. The appendix is not visualized, but there is no edema or inflammation in the region of the cecum. Small metallic foreign body in the cecum compatible with ingested foreign body. Diverticular changes are noted in the left colon without evidence of diverticulitis. Vascular/Lymphatic: There is abdominal aortic atherosclerosis without aneurysm. Mild haziness in the central small bowel mesentery is nonspecific. There is no  gastrohepatic or hepatoduodenal ligament lymphadenopathy. No intraperitoneal or retroperitoneal lymphadenopathy. No pelvic sidewall lymphadenopathy. Reproductive: The uterus has normal CT imaging appearance. There is no adnexal mass. Other: No intraperitoneal free fluid. Musculoskeletal: 15.6 x 9.7 x 6.2 cm heterogeneous mass is identified in the lower right rectus sheath. Imaging features most suggestive of rectus sheath hematoma. There is a tiny focus of hyper attenuation (image 64 series 2 and coronal image 37 series 5) suggesting continued extravasation/bleeding. There is some edema/hemorrhage in the extraperitoneal anterior pelvis. The hematoma generates mass-effect on the bladder. Small bilateral groin hernias contain only fat. Degenerative disc disease noted L4-5. IMPRESSION: 1. 16 x 10 x 6 cm heterogeneous mass in the lower right rectus sheath has CT imaging features most consistent with rectus sheath hematoma. There is a small blush of contrast extravasation along the cranial portion of the hematoma suggesting continued bleeding. 2. Tiny metallic foreign body in the cecum, likely ingested. 3. Cholelithiasis. 4. Hepatic and right renal cyst. 5. 7 mm right lower lobe pulmonary nodule. Non-contrast chest CT at 6-12 months is recommended. If the nodule is stable at time of repeat CT, then future CT at 18-24 months (from today's scan) is considered optional for low-risk  patients, but is recommended for high-risk patients. This recommendation follows the consensus statement: Guidelines for Management of Incidental Pulmonary Nodules Detected on CT Images: From the Fleischner Society 2017; Radiology 2017; 284:228-243. 6.  Aortic Atherosclerois (ICD10-170.0) I personally discussed the presence of the rectus sheath hematoma with contrast extravasation by telephone with Dr. Tyrone Nine at 1858 hours on 07/11/2017. Electronically Signed   By: Misty Stanley M.D.   On: 07/11/2017 18:58    Assessment and Plan:   1. Spontaneous Rectus sheath bleed, with hematoma and pain.  Coumadin on hold.   2. AVR with mechanical valve coumadin on hold.  Dr. Sallyanne Kuster to see with plans for holding coumadin. 3. PAF with 2 ablations and now maintaining SR on amiodarone and dilt.. 4. Anxiety, did feel like panic attack with the pain yesterday, now stable.  5. No hx CAD     For questions or updates, please contact [place] Please consult [email or web address] for contact info under Cardiology/STEMI.   Signed, Cecilie Kicks, NP  07/12/2017 10:04 AM   I have seen and examined the patient along with Cecilie Kicks, NP .  I have reviewed the chart, notes and new data.  I agree with PA/NP's note.  Key new complaints: Still is quite sore in [name] flank.  Denies dyspnea or any other cardiovascular complaints. Key examination changes: Very crisp prosthetic valve sounds, regular rate and rhythm. Key new findings / data: INR 1.93  PLAN: [name] has a modern design bileaflet mechanical valve in the aortic position, with a relatively low thromboembolic risk.  [name] is in sinus rhythm.  As such, I think the safest course of action is to hold [name] warfarin for 3-4 days, while monitoring for any evidence of ongoing bleeding. When resuming anticoagulation I would not "bridge" with heparin or any other quick acting anticoagulants but simply I allow the INR to gradually drift upwards.  Try to keep INR 2.0-2.5 in the next  several weeks.  Sanda Klein, MD, [place] [phone] 07/12/2017, 4:03 PM

## 2017-07-12 NOTE — Evaluation (Signed)
Physical Therapy Evaluation Patient Details Name: [name] MRN: [id number] DOB: [birth year] Today's Date: 07/12/2017   History of Present Illness  72 year old Caucasian female with a past medical history of hypertension, hyperlipidemia, aortic stenosis status post mechanical aortic valve replacement, mitral regurgitation, paroxysmal atrial fibrillation presented with complains of abdominal pain.  Patient has been constipated for the past 3 months.  Recently seen by her primary care physician and had to be disimpacted.  She has been straining a lot as a result of her constipation.  Evaluation revealed a rectus sheath hematoma.    Clinical Impression  Pt admitted with above diagnosis. Pt currently with functional limitations due to the deficits listed below (see PT Problem List).  Pt will benefit from skilled PT to increase their independence and safety with mobility to allow discharge to the venue listed below.  Pt with heavy reliance on RW with gait due to soreness in R LQ.  Pt was educated on proper toilet postures to aid in defecation.     Follow Up Recommendations No PT follow up    Equipment Recommendations  None recommended by PT    Recommendations for Other Services       Precautions / Restrictions Precautions Precautions: None Restrictions Weight Bearing Restrictions: No      Mobility  Bed Mobility Overal bed mobility: Modified Independent             General bed mobility comments: HOB elevated  Transfers Overall transfer level: Needs assistance Equipment used: Rolling walker (2 wheeled) Transfers: Sit to/from Stand Sit to Stand: Min assist         General transfer comment: Husband assisted pt to standing  Ambulation/Gait Ambulation/Gait assistance: Min guard Ambulation Distance (Feet): 95 Feet Assistive device: Rolling walker (2 wheeled) Gait Pattern/deviations: Decreased step length - right;Decreased step length - left     General Gait Details:  heavy reliance on RW due to pain.  o2 90% on RA with gait  Stairs            Wheelchair Mobility    Modified Rankin (Stroke Patients Only)       Balance Overall balance assessment: Needs assistance   Sitting balance-Leahy Scale: Good       Standing balance-Leahy Scale: Fair                               Pertinent Vitals/Pain Pain Assessment: 0-10 Pain Score: 5  Pain Location: R LQ with gait Pain Descriptors / Indicators: Aching;Sore Pain Intervention(s): Limited activity within patient's tolerance;Monitored during session;Repositioned    Home Living Family/patient expects to be discharged to:: Private residence Living Arrangements: Spouse/significant other   Type of Home: Other(Comment)(townhome) Home Access: Level entry     Home Layout: One level Home Equipment: Walker - 2 wheels;Cane - single point;Bedside commode;Shower seat - built in Additional Comments: wears o2 and CPAP at night    Prior Function Level of Independence: Independent               Hand Dominance        Extremity/Trunk Assessment   Upper Extremity Assessment Upper Extremity Assessment: Overall WFL for tasks assessed    Lower Extremity Assessment Lower Extremity Assessment: Overall WFL for tasks assessed       Communication   Communication: No difficulties  Cognition Arousal/Alertness: Awake/alert Behavior During Therapy: WFL for tasks assessed/performed Overall Cognitive Status: Within Functional Limits for tasks assessed  General Comments      Exercises     Assessment/Plan    PT Assessment Patient needs continued PT services  PT Problem List Decreased activity tolerance;Decreased knowledge of use of DME;Decreased mobility       PT Treatment Interventions DME instruction;Gait training;Functional mobility training;Balance training;Therapeutic exercise;Therapeutic activities    PT Goals  (Current goals can be found in the Care Plan section)  Acute Rehab PT Goals Patient Stated Goal: go home PT Goal Formulation: With patient/family Time For Goal Achievement: 07/26/17 Potential to Achieve Goals: Good    Frequency Min 3X/week   Barriers to discharge        Co-evaluation               AM-PAC PT "6 Clicks" Daily Activity  Outcome Measure Difficulty turning over in bed (including adjusting bedclothes, sheets and blankets)?: A Little Difficulty moving from lying on back to sitting on the side of the bed? : A Little Difficulty sitting down on and standing up from a chair with arms (e.g., wheelchair, bedside commode, etc,.)?: Unable Help needed moving to and from a bed to chair (including a wheelchair)?: A Little Help needed walking in hospital room?: A Little Help needed climbing 3-5 steps with a railing? : A Little 6 Click Score: 16    End of Session   Activity Tolerance: Patient tolerated treatment well;Patient limited by pain Patient left: in chair;with family/visitor present   PT Visit Diagnosis: Difficulty in walking, not elsewhere classified (R26.2)    Time: 1610-9604 PT Time Calculation (min) (ACUTE ONLY): 26 min   Charges:   PT Evaluation $PT Eval Low Complexity: 1 Low PT Treatments $Gait Training: 8-22 mins   PT G Codes:        Sirenity Shew L. Tamala Julian, [place] Pager [phone] 07/12/2017   Galen Manila 07/12/2017, 3:01 PM

## 2017-07-13 DIAGNOSIS — Z952 Presence of prosthetic heart valve: Secondary | ICD-10-CM | POA: Diagnosis not present

## 2017-07-13 DIAGNOSIS — I10 Essential (primary) hypertension: Secondary | ICD-10-CM

## 2017-07-13 DIAGNOSIS — S301XXA Contusion of abdominal wall, initial encounter: Secondary | ICD-10-CM | POA: Diagnosis not present

## 2017-07-13 DIAGNOSIS — I481 Persistent atrial fibrillation: Secondary | ICD-10-CM | POA: Diagnosis not present

## 2017-07-13 DIAGNOSIS — T148XXA Other injury of unspecified body region, initial encounter: Secondary | ICD-10-CM | POA: Diagnosis not present

## 2017-07-13 LAB — BASIC METABOLIC PANEL
Anion gap: 7 (ref 5–15)
BUN: 15 mg/dL (ref 6–20)
CO2: 30 mmol/L (ref 22–32)
Calcium: 8.8 mg/dL — ABNORMAL LOW (ref 8.9–10.3)
Chloride: 100 mmol/L — ABNORMAL LOW (ref 101–111)
Creatinine, Ser: 0.76 mg/dL (ref 0.44–1.00)
GFR calc Af Amer: >60 mL/min (ref >60)
GLUCOSE: 124 mg/dL — AB (ref 65–99)
POTASSIUM: 4.8 mmol/L (ref 3.5–5.1)
Sodium: 137 mmol/L (ref 135–145)

## 2017-07-13 LAB — CBC
HCT: 33 % — ABNORMAL LOW (ref 36.0–46.0)
Hemoglobin: 10.6 g/dL — ABNORMAL LOW (ref 12.0–15.0)
MCH: 29.4 pg (ref 26.0–34.0)
MCHC: 32.1 g/dL (ref 30.0–36.0)
MCV: 91.7 fL (ref 78.0–100.0)
PLATELETS: 270 10*3/uL (ref 150–400)
RBC: 3.6 MIL/uL — AB (ref 3.87–5.11)
RDW: 14.7 % (ref 11.5–15.5)
WBC: 11.1 10*3/uL — ABNORMAL HIGH (ref 4.0–10.5)

## 2017-07-13 LAB — TSH: TSH: 2.968 u[IU]/mL (ref 0.350–4.500)

## 2017-07-13 LAB — T4, FREE: Free T4: 1.4 ng/dL — ABNORMAL HIGH (ref 0.61–1.12)

## 2017-07-13 MED ORDER — SENNOSIDES-DOCUSATE SODIUM 8.6-50 MG PO TABS
2.0000 | ORAL_TABLET | Freq: Every day | ORAL | Status: DC
  Administered 2017-07-13 – 2017-07-14 (×2): 2 via ORAL
  Filled 2017-07-13 (×2): qty 2

## 2017-07-13 MED ORDER — POLYETHYLENE GLYCOL 3350 17 G PO PACK
17.0000 g | PACK | Freq: Two times a day (BID) | ORAL | Status: DC
  Administered 2017-07-13 – 2017-07-15 (×5): 17 g via ORAL
  Filled 2017-07-13 (×5): qty 1

## 2017-07-13 MED ORDER — BENZONATATE 100 MG PO CAPS
100.0000 mg | ORAL_CAPSULE | Freq: Three times a day (TID) | ORAL | Status: DC
  Administered 2017-07-13 – 2017-07-15 (×8): 100 mg via ORAL
  Filled 2017-07-13 (×8): qty 1

## 2017-07-13 MED ORDER — GUAIFENESIN-DM 100-10 MG/5ML PO SYRP
5.0000 mL | ORAL_SOLUTION | ORAL | Status: DC | PRN
  Filled 2017-07-13: qty 10

## 2017-07-13 NOTE — Progress Notes (Signed)
Progress Note   Subjective   Doing well today, the patient denies CP or SOB. Abdominal discomfort is better.  No new concerns  Inpatient Medications    Scheduled Meds: . amiodarone  200 mg Oral Daily  . chlorhexidine  15 mL Mouth Rinse BID  . diltiazem  240 mg Oral Daily  . docusate sodium  100 mg Oral BID  . DULoxetine  60 mg Oral Daily  . furosemide  80 mg Oral Daily  . levothyroxine  100 mcg Oral QAC breakfast  . losartan  100 mg Oral Daily  . mouth rinse  15 mL Mouth Rinse q12n4p  . mometasone-formoterol  2 puff Inhalation BID  . polyethylene glycol  17 g Oral Daily  . potassium chloride  20 mEq Oral QHS  . potassium chloride SA  40 mEq Oral Daily  . pravastatin  10 mg Oral Daily  . sodium chloride flush  3 mL Intravenous Q12H  . tretinoin  1 application Topical QHS   Continuous Infusions: . sodium chloride     PRN Meds: sodium chloride, acetaminophen, diazepam, sodium chloride flush, traMADol, zolpidem   Vital Signs    Vitals:   07/12/17 2000 07/12/17 2037 07/12/17 2041 07/13/17 0440  BP:  (!) 120/48 (!) 112/53 (!) 122/51  Pulse:  86  81  Resp:  16  18  Temp:  98.5 F (36.9 C)  99.6 F (37.6 C)  TempSrc:  Oral  Oral  SpO2: 95% 90%  (!) 87%  Weight:    231 lb 1.6 oz (104.8 kg)  Height:        Intake/Output Summary (Last 24 hours) at 07/13/2017 0758 Last data filed at 07/12/2017 2200 Gross per 24 hour  Intake 360 ml  Output -  Net 360 ml   Filed Weights   07/11/17 2123 07/12/17 0500 07/13/17 0440  Weight: 230 lb 2.6 oz (104.4 kg) 231 lb 11.3 oz (105.1 kg) 231 lb 1.6 oz (104.8 kg)    Telemetry    Sinus rhythm with first degree AV block and intermittent aberrancy - Personally Reviewed  Physical Exam   GEN- The patient is well appearing, alert and oriented x 3 today.   Head- normocephalic, atraumatic Eyes-  Sclera clear, conjunctiva pink Ears- hearing intact Oropharynx- clear Neck- supple, Lungs- Clear to ausculation bilaterally, normal work of  breathing Heart- Regular rate and rhythm , mechanical S2 GI- soft,   + BS Extremities- no clubbing, cyanosis, or edema  MS- no significant deformity or atrophy Skin- no rash or lesion Psych- euthymic mood, full affect Neuro- strength and sensation are intact   Labs    Chemistry Recent Labs  Lab 07/11/17 1625 07/11/17 1633 07/12/17 0424 07/13/17 0415  NA 141 140 139 137  K 4.2 4.2 3.7 4.8  CL 103 100* 102 100*  CO2 29  --  28 30  GLUCOSE 117* 116* 137* 124*  BUN 15 15 12 15   CREATININE 0.82 0.80 0.79 0.76  CALCIUM 9.1  --  8.7* 8.8*  PROT 7.4  --  6.9  --   ALBUMIN 3.7  --  3.4*  --   AST 25  --  21  --   ALT 22  --  21  --   ALKPHOS 100  --  92  --   BILITOT 0.5  --  0.7  --   GFRNONAA >60  --  >60 >60  GFRAA >60  --  >60 >60  ANIONGAP 9  --  9  7     Hematology Recent Labs  Lab 07/11/17 1625 07/11/17 1633 07/12/17 0424 07/13/17 0415  WBC 7.4  --  8.6 11.1*  RBC 4.04  --  3.80* 3.60*  HGB 11.7* 12.2 11.0* 10.6*  HCT 36.7 36.0 35.3* 33.0*  MCV 90.8  --  92.9 91.7  MCH 29.0  --  28.9 29.4  MCHC 31.9  --  31.2 32.1  RDW 14.4  --  14.8 14.7  PLT 324  --  303 270    Cardiac EnzymesNo results for input(s): TROPONINI in the last 168 hours. No results for input(s): TROPIPOC in the last 168 hours.      Assessment & Plan    1.  Atrial fibrillation Maintaining sinus rhythm with amiodarone chronically chads2vasc score is at least 3.  She also has mechanical AVR Off coumadin for now due to hematoma No changes advised  2. 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  Would not bridge with heparin.  Resume coumadin when able with goal INR 2-2.5 for the next several weeks.  Cardiology to see as needed over the weekend.  Please call with questions  Thompson Grayer MD, [facility] 07/13/2017 7:58 AM

## 2017-07-13 NOTE — Progress Notes (Signed)
OT Cancellation Note  Patient Details Name: LUISE YAMAMOTO MRN: [id number] DOB: 10-Aug-1945   Cancelled Treatment:    Reason Eval/Treat Not Completed: OT screened, no needs identified, will sign off. Spoke with patient, who reports she is mobilizing well and is having no difficulty with her ADLs. No OT needs at this time. Will sign off.  Gilad Dugger A Haizley Cannella 07/13/2017, 1:58 PM

## 2017-07-13 NOTE — Progress Notes (Signed)
[facility] PROGRESS NOTE  [name] DUK:[id number] DOB: 04-Oct-1945 DOA: 07/11/2017  PCP: Maurice Small, MD  Brief History/Interval Summary: 72 year old Caucasian female with a past medical history of hypertension, hyperlipidemia, aortic stenosis status post mechanical aortic valve replacement, mitral regurgitation, paroxysmal atrial fibrillation presented with complains of abdominal pain.  Patient has been constipated for the past 3 months.  Recently seen by her primary care physician and had to be disimpacted.  She has been straining a lot as a result of her constipation.  Evaluation revealed a rectus sheath hematoma.  Patient is on anticoagulation.  She was hospitalized for further management.  Reason for Visit: Rectus sheath hematoma  Consultants: Cardiology.  Phone discussion with the general surgery by ED provider.  Procedures: None  Antibiotics: None  Subjective/Interval History: Patient states that she feels better this morning.  Pain in the abdomen has improved.  No nausea vomiting.  Denies any shortness of breath.  Has a dry cough occasionally.    ROS: Denies any chest pain  Objective:  Vital Signs  Vitals:   07/12/17 2000 07/12/17 2037 07/12/17 2041 07/13/17 0440  BP:  (!) 120/48 (!) 112/53 (!) 122/51  Pulse:  86  81  Resp:  16  18  Temp:  98.5 F (36.9 C)  99.6 F (37.6 C)  TempSrc:  Oral  Oral  SpO2: 95% 90%  (!) 87%  Weight:    104.8 kg (231 lb 1.6 oz)  Height:        Intake/Output Summary (Last 24 hours) at 07/13/2017 0844 Last data filed at 07/12/2017 2200 Gross per 24 hour  Intake 360 ml  Output -  Net 360 ml   Filed Weights   07/11/17 2123 07/12/17 0500 07/13/17 0440  Weight: 104.4 kg (230 lb 2.6 oz) 105.1 kg (231 lb 11.3 oz) 104.8 kg (231 lb 1.6 oz)    General appearance: Awake alert.  In no distress Resp: Clear to auscultation bilaterally.  No wheezing rales or rhonchi Cardio: S1-S2 with mechanical sound. GI: Abdomen remains soft.   Less tender today in the right lower quadrant.  Bowel sounds present.  No obvious masses organomegaly.   Extremities: No edema Pulses: 2+ and symmetric Neurologic: No obvious focal neurological deficit  Lab Results:  Data Reviewed: I have personally reviewed following labs and imaging studies  CBC: Recent Labs  Lab 07/11/17 1625 07/11/17 1633 07/12/17 0424 07/13/17 0415  WBC 7.4  --  8.6 11.1*  NEUTROABS 5.6  --   --   --   HGB 11.7* 12.2 11.0* 10.6*  HCT 36.7 36.0 35.3* 33.0*  MCV 90.8  --  92.9 91.7  PLT 324  --  303 376    Basic Metabolic Panel: Recent Labs  Lab 07/11/17 1625 07/11/17 1633 07/12/17 0424 07/13/17 0415  NA 141 140 139 137  K 4.2 4.2 3.7 4.8  CL 103 100* 102 100*  CO2 29  --  28 30  GLUCOSE 117* 116* 137* 124*  BUN 15 15 12 15   CREATININE 0.82 0.80 0.79 0.76  CALCIUM 9.1  --  8.7* 8.8*    GFR: Estimated Creatinine Clearance: 79.2 mL/min (by C-G formula based on SCr of 0.76 mg/dL).  Liver Function Tests: Recent Labs  Lab 07/11/17 1625 07/12/17 0424  AST 25 21  ALT 22 21  ALKPHOS 100 92  BILITOT 0.5 0.7  PROT 7.4 6.9  ALBUMIN 3.7 3.4*    Recent Labs  Lab 07/11/17 1625  LIPASE 20  Coagulation Profile: Recent Labs  Lab 07/11/17 1625 07/12/17 0424  INR 1.92 1.93     Radiology Studies: Ct Abdomen Pelvis W Contrast  Result Date: 07/11/2017 CLINICAL DATA:  Right lower quadrant pain EXAM: CT ABDOMEN AND PELVIS WITH CONTRAST TECHNIQUE: Multidetector CT imaging of the abdomen and pelvis was performed using the standard protocol following bolus administration of intravenous contrast. CONTRAST:  154mL ISOVUE-300 IOPAMIDOL (ISOVUE-300) INJECTION 61% COMPARISON:  10/23/2016 virtual colonoscopy. FINDINGS: Lower chest: 7 mm nodule identified medial right costophrenic sulcus (image 34/4). Hepatobiliary: 12 mm low-density lesion dome of left liver is stable and compatible with cyst. Liver otherwise unremarkable. 3.1 cm calcified gallstone  evident. No intrahepatic or extrahepatic biliary dilation. Pancreas: No focal mass lesion. No dilatation of the main duct. No intraparenchymal cyst. No peripancreatic edema. Spleen: No splenomegaly. No focal mass lesion. Adrenals/Urinary Tract: No adrenal nodule or mass. 6.1 cm cyst in the upper pole right kidney is similar to prior. Right kidney otherwise unremarkable. Left kidney unremarkable. No evidence for hydroureter. Extrinsic mass-effect on the bladder which is otherwise unremarkable. Stomach/Bowel: Small hiatal hernia. Duodenum is normally positioned as is the ligament of Treitz. No small bowel wall thickening. No small bowel dilatation. The terminal ileum is normal. The appendix is not visualized, but there is no edema or inflammation in the region of the cecum. Small metallic foreign body in the cecum compatible with ingested foreign body. Diverticular changes are noted in the left colon without evidence of diverticulitis. Vascular/Lymphatic: There is abdominal aortic atherosclerosis without aneurysm. Mild haziness in the central small bowel mesentery is nonspecific. There is no gastrohepatic or hepatoduodenal ligament lymphadenopathy. No intraperitoneal or retroperitoneal lymphadenopathy. No pelvic sidewall lymphadenopathy. Reproductive: The uterus has normal CT imaging appearance. There is no adnexal mass. Other: No intraperitoneal free fluid. Musculoskeletal: 15.6 x 9.7 x 6.2 cm heterogeneous mass is identified in the lower right rectus sheath. Imaging features most suggestive of rectus sheath hematoma. There is a tiny focus of hyper attenuation (image 64 series 2 and coronal image 37 series 5) suggesting continued extravasation/bleeding. There is some edema/hemorrhage in the extraperitoneal anterior pelvis. The hematoma generates mass-effect on the bladder. Small bilateral groin hernias contain only fat. Degenerative disc disease noted L4-5. IMPRESSION: 1. 16 x 10 x 6 cm heterogeneous mass in the  lower right rectus sheath has CT imaging features most consistent with rectus sheath hematoma. There is a small blush of contrast extravasation along the cranial portion of the hematoma suggesting continued bleeding. 2. Tiny metallic foreign body in the cecum, likely ingested. 3. Cholelithiasis. 4. Hepatic and right renal cyst. 5. 7 mm right lower lobe pulmonary nodule. Non-contrast chest CT at 6-12 months is recommended. If the nodule is stable at time of repeat CT, then future CT at 18-24 months (from today's scan) is considered optional for low-risk patients, but is recommended for high-risk patients. This recommendation follows the consensus statement: Guidelines for Management of Incidental Pulmonary Nodules Detected on CT Images: From the Fleischner Society 2017; Radiology 2017; 284:228-243. 6.  Aortic Atherosclerois (ICD10-170.0) I personally discussed the presence of the rectus sheath hematoma with contrast extravasation by telephone with Dr. Tyrone Nine at 1858 hours on 07/11/2017. Electronically Signed   By: Misty Stanley M.D.   On: 07/11/2017 18:58     Medications:  Scheduled: . amiodarone  200 mg Oral Daily  . benzonatate  100 mg Oral TID  . chlorhexidine  15 mL Mouth Rinse BID  . diltiazem  240 mg Oral Daily  . docusate  sodium  100 mg Oral BID  . DULoxetine  60 mg Oral Daily  . furosemide  80 mg Oral Daily  . levothyroxine  100 mcg Oral QAC breakfast  . losartan  100 mg Oral Daily  . mouth rinse  15 mL Mouth Rinse q12n4p  . mometasone-formoterol  2 puff Inhalation BID  . polyethylene glycol  17 g Oral BID  . potassium chloride  20 mEq Oral QHS  . potassium chloride SA  40 mEq Oral Daily  . pravastatin  10 mg Oral Daily  . senna-docusate  2 tablet Oral QHS  . sodium chloride flush  3 mL Intravenous Q12H  . tretinoin  1 application Topical QHS   Continuous: . sodium chloride     DPO:EUMPNT chloride, acetaminophen, diazepam, sodium chloride flush, traMADol,  zolpidem  Assessment/Plan:  Principal Problem:   Hematoma Active Problems:   Persistent atrial fibrillation (HCC)   Essential hypertension   Rectus sheath hematoma, initial encounter    Rectus sheath hematoma Most likely a result of straining from constipation. She also had recent pneumonia with severe coughing episodes which could have also contributed.  These findings were discussed with general surgery who did not recommend any surgical intervention and only recommended monitoring for warfarin was placed on hold.  Hemoglobin remains stable.  History of aortic stenosis status post aortic valve replacement with mechanical valve This was done in 2006.  Stable.  Has been anticoagulated with warfarin.  Appreciate cardiology input.  Discussed with Dr. Rayann Heman today.  He recommends resuming warfarin on Monday as long as her hemoglobin is stable.  He will arrange for outpatient follow-up for INR monitoring.  No need for bridging treatment.    History of paroxysmal atrial fibrillation Continue home medications including Cardizem and amiodarone.  Stable  Normocytic anemia/possible acute blood loss anemia Baseline hemoglobin around 12.8.  Hemoglobin to 10.6 today.  Will recheck tomorrow morning.    Hyperlipidemia Continue pravastatin  History of hypothyroidism Continue levothyroxine.  Thyroid function test results noted.  No change to treatment.  History of anxiety disorder Continue Xanax.  Stable  History of asthma Stable.  Continue home medications.  Constipation Apparently this has been ongoing for the last 3 months.  Etiology is unclear.  TSH normal.  Free T4 slightly high at 1.4.  Continue with laxatives and stool softeners.  She will need continued follow-up by her PCP and may need referral to gastroenterology for further evaluation. Unclear when was her last colonoscopy.    Tiny metallic foreign body in the cecum Incidentally detected on CT scan.  We will allow it to pass on  its own.  Cholelithiasis/Hepatic and right renal cyst Incidentally detected on CT scan.  Outpatient monitoring.  7 mm right lower lobe pulmonary nodule Non-contrast chest CT at 6-12 months is recommended. If the nodule is stable at time of repeat CT, then future CT at 18-24 months (from today's scan) is considered optional for low-risk patients, but is recommended for high-risk patients.   DVT Prophylaxis: Has been on warfarin.  INR was 1.9 yesterday.  Not checked today.  We will check tomorrow.  She is ambulating. Code Status: Full code Family Communication: Discussed with the patient Disposition Plan: Management as outlined above.  If hemoglobin remains stable for the next 24 hours she should be able to go home tomorrow.    LOS: 0 days   [facility] Pager [phone] 07/13/2017, 8:44 AM  If 7PM-7AM, please contact night-coverage at [email or web address], password [email or web address]

## 2017-07-14 DIAGNOSIS — B029 Zoster without complications: Secondary | ICD-10-CM

## 2017-07-14 DIAGNOSIS — S301XXA Contusion of abdominal wall, initial encounter: Secondary | ICD-10-CM | POA: Diagnosis not present

## 2017-07-14 DIAGNOSIS — I481 Persistent atrial fibrillation: Secondary | ICD-10-CM | POA: Diagnosis not present

## 2017-07-14 LAB — CBC
HCT: 31.2 % — ABNORMAL LOW (ref 36.0–46.0)
HEMOGLOBIN: 9.8 g/dL — AB (ref 12.0–15.0)
MCH: 28.9 pg (ref 26.0–34.0)
MCHC: 31.4 g/dL (ref 30.0–36.0)
MCV: 92 fL (ref 78.0–100.0)
Platelets: 276 10*3/uL (ref 150–400)
RBC: 3.39 MIL/uL — ABNORMAL LOW (ref 3.87–5.11)
RDW: 14.8 % (ref 11.5–15.5)
WBC: 9.6 10*3/uL (ref 4.0–10.5)

## 2017-07-14 LAB — BASIC METABOLIC PANEL
ANION GAP: 8 (ref 5–15)
BUN: 12 mg/dL (ref 6–20)
CHLORIDE: 98 mmol/L — AB (ref 101–111)
CO2: 28 mmol/L (ref 22–32)
Calcium: 8.7 mg/dL — ABNORMAL LOW (ref 8.9–10.3)
Creatinine, Ser: 0.72 mg/dL (ref 0.44–1.00)
GFR calc non Af Amer: >60 mL/min (ref >60)
Glucose, Bld: 130 mg/dL — ABNORMAL HIGH (ref 65–99)
Potassium: 5.1 mmol/L (ref 3.5–5.1)
Sodium: 134 mmol/L — ABNORMAL LOW (ref 135–145)

## 2017-07-14 LAB — PROTIME-INR
INR: 1.52
Prothrombin Time: 18.1 seconds — ABNORMAL HIGH (ref 11.4–15.2)

## 2017-07-14 MED ORDER — VALACYCLOVIR HCL 500 MG PO TABS
1000.0000 mg | ORAL_TABLET | Freq: Three times a day (TID) | ORAL | Status: DC
  Administered 2017-07-14 – 2017-07-15 (×5): 1000 mg via ORAL
  Filled 2017-07-14 (×6): qty 2

## 2017-07-14 MED ORDER — FLEET ENEMA 7-19 GM/118ML RE ENEM
1.0000 | ENEMA | Freq: Once | RECTAL | Status: AC
  Administered 2017-07-14: 1 via RECTAL
  Filled 2017-07-14: qty 1

## 2017-07-14 NOTE — Progress Notes (Signed)
[facility] PROGRESS NOTE  [name]:[id number] DOB: Mar 14, 1946 DOA: 07/11/2017  PCP: Maurice Small, MD  Brief History/Interval Summary: 72 year old Caucasian female with a past medical history of hypertension, hyperlipidemia, aortic stenosis status post mechanical aortic valve replacement, mitral regurgitation, paroxysmal atrial fibrillation presented with complains of abdominal pain.  Patient has been constipated for the past 3 months.  Recently seen by her primary care physician and had to be disimpacted.  She has been straining a lot as a result of her constipation.  Evaluation revealed a rectus sheath hematoma.  Patient is on anticoagulation.  She was hospitalized for further management.  Reason for Visit: Rectus sheath hematoma.  Herpes zoster  Consultants: Cardiology.  Phone discussion with the general surgery by ED provider.  Procedures: None  Antibiotics: Valtrex 4/7  Subjective/Interval History: Patient stated that she is feeling better.  Still continues to have occasional cough.  Denies any nausea vomiting.  Pain has improved.     ROS: Denies any chest pain.  Objective:  Vital Signs  Vitals:   07/13/17 1426 07/13/17 2200 07/14/17 0430 07/14/17 0752  BP: (!) 116/59 (!) 120/54 (!) 105/48   Pulse: 77 76 70   Resp: 18 18 18    Temp: 99 F (37.2 C) 99.3 F (37.4 C) 98.6 F (37 C)   TempSrc: Oral Oral Oral   SpO2: (!) 89% 98% 98% 93%  Weight:   106.3 kg (234 lb 6.4 oz)   Height:        Intake/Output Summary (Last 24 hours) at 07/14/2017 0903 Last data filed at 07/14/2017 0500 Gross per 24 hour  Intake 720 ml  Output 400 ml  Net 320 ml   Filed Weights   07/12/17 0500 07/13/17 0440 07/14/17 0430  Weight: 105.1 kg (231 lb 11.3 oz) 104.8 kg (231 lb 1.6 oz) 106.3 kg (234 lb 6.4 oz)    General appearance: Awake alert.  In no distress. Resp: Clear to auscultation bilaterally.  No wheezing rales or rhonchi.  Normal effort Cardio: S1-S2 with mechanical  sound GI: Abdomen remains soft.  Mildly tender in the right lower quadrant.  No obvious masses organomegaly.  Bowel sounds are present.   Skin: Erythematous rash with vesicles noted in the abdominal area on the right extending to the back. Neurologic: No obvious focal neurological deficit  Lab Results:  Data Reviewed: I have personally reviewed following labs and imaging studies  CBC: Recent Labs  Lab 07/11/17 1625 07/11/17 1633 07/12/17 0424 07/13/17 0415 07/14/17 0457  WBC 7.4  --  8.6 11.1* 9.6  NEUTROABS 5.6  --   --   --   --   HGB 11.7* 12.2 11.0* 10.6* 9.8*  HCT 36.7 36.0 35.3* 33.0* 31.2*  MCV 90.8  --  92.9 91.7 92.0  PLT 324  --  303 270 295    Basic Metabolic Panel: Recent Labs  Lab 07/11/17 1625 07/11/17 1633 07/12/17 0424 07/13/17 0415 07/14/17 0457  NA 141 140 139 137 134*  K 4.2 4.2 3.7 4.8 5.1  CL 103 100* 102 100* 98*  CO2 29  --  28 30 28   GLUCOSE 117* 116* 137* 124* 130*  BUN 15 15 12 15 12   CREATININE 0.82 0.80 0.79 0.76 0.72  CALCIUM 9.1  --  8.7* 8.8* 8.7*    GFR: Estimated Creatinine Clearance: 79.8 mL/min (by C-G formula based on SCr of 0.72 mg/dL).  Liver Function Tests: Recent Labs  Lab 07/11/17 1625 07/12/17 0424  AST 25 21  ALT 22 21  ALKPHOS 100 92  BILITOT 0.5 0.7  PROT 7.4 6.9  ALBUMIN 3.7 3.4*    Recent Labs  Lab 07/11/17 1625  LIPASE 20   Coagulation Profile: Recent Labs  Lab 07/11/17 1625 07/12/17 0424 07/14/17 0457  INR 1.92 1.93 1.52     Radiology Studies: No results found.   Medications:  Scheduled: . amiodarone  200 mg Oral Daily  . benzonatate  100 mg Oral TID  . chlorhexidine  15 mL Mouth Rinse BID  . diltiazem  240 mg Oral Daily  . docusate sodium  100 mg Oral BID  . DULoxetine  60 mg Oral Daily  . furosemide  80 mg Oral Daily  . levothyroxine  100 mcg Oral QAC breakfast  . losartan  100 mg Oral Daily  . mouth rinse  15 mL Mouth Rinse q12n4p  . mometasone-formoterol  2 puff Inhalation  BID  . polyethylene glycol  17 g Oral BID  . potassium chloride  20 mEq Oral QHS  . pravastatin  10 mg Oral Daily  . senna-docusate  2 tablet Oral QHS  . sodium chloride flush  3 mL Intravenous Q12H  . tretinoin  1 application Topical QHS  . valACYclovir  1,000 mg Oral TID   Continuous: . sodium chloride     OIN:OMVEHM chloride, acetaminophen, diazepam, guaiFENesin-dextromethorphan, sodium chloride flush, traMADol, zolpidem  Assessment/Plan:  Principal Problem:   Hematoma Active Problems:   Persistent atrial fibrillation (HCC)   Essential hypertension   Rectus sheath hematoma, initial encounter    Rectus sheath hematoma Most likely a result of straining from constipation. She also had recent pneumonia with severe coughing episodes which could have also contributed.  These findings were discussed with general surgery who did not recommend any surgical intervention and only recommended monitoring for warfarin was placed on hold.  Mild drop in hemoglobin noted today.  Continue to monitor for now.  Pain has improved.  Herpes Zoster Vesicular lesions identified in the abdomen and appears to be in the T11 dermatome.  Patient will be placed on contact and airborne precautions.  Valtrex will be initiated.  Patient does not appear to be particularly symptomatic.  Nurse was asked to notify infection control.  History of aortic stenosis status post aortic valve replacement with mechanical valve This was done in 2006.  Stable.  Has been anticoagulated with warfarin.  Appreciate cardiology input.  Discussed with Dr. Rayann Heman.  Initially plan was to resume warfarin on Monday.  However mild drop in hemoglobin noted today.  We will recheck it tomorrow.  If hemoglobin remains stable then she can resume her warfarin on Tuesday.  He will arrange for outpatient follow-up for INR monitoring.  There is no need for bridging.    History of paroxysmal atrial fibrillation Continue home medications including  Cardizem and amiodarone.  Stable  Normocytic anemia/possible acute blood loss anemia Baseline hemoglobin around 12.8.  Hemoglobin noted to be 9.8 today.  Continue to monitor for now.     Constipation Apparently this has been ongoing for the last 3 months.  Etiology is unclear.  TSH normal.  Free T4 slightly high at 1.4.  Continue with laxatives and stool softeners.  She will need continued follow-up by her PCP and may need referral to gastroenterology for further evaluation she mentions that she had a virtual colonoscopy within the last 12 months which was unremarkable.  She will follow-up with her PCP and request referral to gastroenterology.  She still has not  had a bowel movement.  We will give her a fleets enema.  Hyperlipidemia Continue pravastatin  History of hypothyroidism Continue levothyroxine.  Thyroid function test results noted.  No change to treatment.  History of anxiety disorder Continue Xanax.  Stable  History of asthma Stable.  Continue home medications.  Tiny metallic foreign body in the cecum Incidentally detected on CT scan.  We will allow it to pass on its own.  Cholelithiasis/Hepatic and right renal cyst Incidentally detected on CT scan.  Outpatient monitoring.  7 mm right lower lobe pulmonary nodule Non-contrast chest CT at 6-12 months is recommended. If the nodule is stable at time of repeat CT, then future CT at 18-24 months (from today's scan) is considered optional for low-risk patients, but is recommended for high-risk patients.   DVT Prophylaxis: Has been on warfarin.  INR 1.5. She is ambulating. Code Status: Full code Family Communication: Discussed with the patient Disposition Plan: Management as outlined above.  Check hemoglobin again tomorrow.  Treat the herpes zoster.    LOS: 0 days   [facility] Pager [phone] 07/14/2017, 9:03 AM  If 7PM-7AM, please contact night-coverage at [email or web address], password [email or web address]

## 2017-07-15 DIAGNOSIS — S301XXA Contusion of abdominal wall, initial encounter: Secondary | ICD-10-CM | POA: Diagnosis not present

## 2017-07-15 DIAGNOSIS — T148XXA Other injury of unspecified body region, initial encounter: Secondary | ICD-10-CM | POA: Diagnosis not present

## 2017-07-15 DIAGNOSIS — I481 Persistent atrial fibrillation: Secondary | ICD-10-CM | POA: Diagnosis not present

## 2017-07-15 LAB — CBC
HCT: 31.3 % — ABNORMAL LOW (ref 36.0–46.0)
Hemoglobin: 9.9 g/dL — ABNORMAL LOW (ref 12.0–15.0)
MCH: 29 pg (ref 26.0–34.0)
MCHC: 31.6 g/dL (ref 30.0–36.0)
MCV: 91.8 fL (ref 78.0–100.0)
PLATELETS: 342 10*3/uL (ref 150–400)
RBC: 3.41 MIL/uL — AB (ref 3.87–5.11)
RDW: 15 % (ref 11.5–15.5)
WBC: 7.3 10*3/uL (ref 4.0–10.5)

## 2017-07-15 LAB — BASIC METABOLIC PANEL
ANION GAP: 10 (ref 5–15)
BUN: 14 mg/dL (ref 6–20)
CO2: 27 mmol/L (ref 22–32)
Calcium: 9 mg/dL (ref 8.9–10.3)
Chloride: 100 mmol/L — ABNORMAL LOW (ref 101–111)
Creatinine, Ser: 0.67 mg/dL (ref 0.44–1.00)
GFR calc Af Amer: >60 mL/min (ref >60)
Glucose, Bld: 113 mg/dL — ABNORMAL HIGH (ref 65–99)
POTASSIUM: 4.8 mmol/L (ref 3.5–5.1)
SODIUM: 137 mmol/L (ref 135–145)

## 2017-07-15 MED ORDER — DOCUSATE SODIUM 100 MG PO CAPS: 100.0000 mg | ORAL_CAPSULE | Freq: Two times a day (BID) | ORAL | 0 refills | Status: DC

## 2017-07-15 MED ORDER — VALACYCLOVIR HCL 1 G PO TABS: 1000.0000 mg | ORAL_TABLET | Freq: Three times a day (TID) | ORAL | 0 refills | Status: AC

## 2017-07-15 MED ORDER — SORBITOL 70 % SOLN
960.0000 mL | TOPICAL_OIL | Freq: Once | ORAL | Status: DC
  Filled 2017-07-15: qty 473

## 2017-07-15 MED ORDER — SENNOSIDES-DOCUSATE SODIUM 8.6-50 MG PO TABS: 2.0000 | ORAL_TABLET | Freq: Two times a day (BID) | ORAL | 0 refills | Status: DC

## 2017-07-15 MED ORDER — BISACODYL 10 MG RE SUPP: 10.0000 mg | RECTAL | 0 refills | Status: DC | PRN

## 2017-07-15 MED ORDER — POLYETHYLENE GLYCOL 3350 17 G PO PACK: PACK | ORAL | 0 refills | Status: DC

## 2017-07-15 NOTE — Discharge Summary (Signed)
[facility]  Physician Discharge Summary   Patient ID: [name] MRN: [id number] DOB/AGE: [birth year] 72 y.o.  Admit date: 07/11/2017 Discharge date: 07/15/2017  PCP: Maurice Small, MD  DISCHARGE DIAGNOSES:  Principal Problem:   Hematoma Active Problems:   Persistent atrial fibrillation ([place])   Essential hypertension   Rectus sheath hematoma, initial encounter   RECOMMENDATIONS FOR OUTPATIENT FOLLOW UP: 1. Patient instructed to follow-up with her primary care physician and consider referral to gastroenterology if constipation does not improve 2. Patient to go for INR check in the next few days.  This will be arranged by cardiology.  Discussed with Dr. Rayann Heman on Sunday. 3. See below regarding incidental findings on CT scan.   DISCHARGE CONDITION: fair  Diet recommendation: As before  Filed Weights   07/13/17 0440 07/14/17 0430 07/15/17 0450  Weight: 104.8 kg (231 lb 1.6 oz) 106.3 kg (234 lb 6.4 oz) (!) 233.3 kg (514 lb 5.3 oz)    INITIAL HISTORY: 72 year old Caucasian female with a past medical history of hypertension, hyperlipidemia, aortic stenosis status post mechanical aortic valve replacement, mitral regurgitation, paroxysmal atrial fibrillation presented with complains of abdominal pain.  Patient has been constipated for the past 3 months.  Recently seen by her primary care physician and had to be disimpacted.  She has been straining a lot as a result of her constipation.  Evaluation revealed a rectus sheath hematoma.  Patient is on anticoagulation.  She was hospitalized for further management.  Consultants: Cardiology.  Phone discussion with the general surgery by ED provider.   HOSPITAL COURSE:    Rectus sheath hematoma Most likely a result of straining from constipation. She also had recent pneumonia with severe coughing episodes which could have also contributed.  These findings were discussed with general surgery who did not recommend any surgical  intervention and only recommended monitoring for warfarin was placed on hold.  Hemoglobin did drift down however has been stable for the last 48 hours.  Pain has improved.  Herpes Zoster Vesicular lesions identified in the abdomen and appears to be in the T11 dermatome.  Patient did not have any pain associated with these lesions.  She was started on Valtrex which she seems to be tolerating well.  She will be given a prescription for same.  Information regarding infection control and spread provided to the patient.  History of aortic stenosis status post aortic valve replacement with mechanical valve She underwent valve replacement in 2006.  Has been anticoagulated with warfarin.  Due to episode of bleeding anticoagulation was held.  Cardiology input was sought and we appreciate their assistance.  Hemoglobin has stabilized.  Patient symptoms have improved.  Okay to resume anticoagulation from tomorrow.   History of paroxysmal atrial fibrillation Continue home medications including Cardizem and amiodarone.  Stable.  Normocytic anemia/possible acute blood loss anemia Baseline hemoglobin around 12.8.    Hemoglobin dropped down to 9.8 and stable at 9.9 today.    Constipation Apparently this has been ongoing for the last 3 months.  Etiology is unclear.  TSH normal.  Free T4 slightly high at 1.4.  Continue with laxatives and stool softeners.  She will need continued follow-up by her PCP and may need referral to gastroenterology for further evaluation. She mentions that she had a virtual colonoscopy within the last 12 months which was unremarkable.  She will follow-up with her PCP and request referral to gastroenterology.    She did have a bowel movement after she was given a smog enema.  Hyperlipidemia Continue pravastatin  History of hypothyroidism Continue levothyroxine.  Thyroid function test results noted.  No change to treatment.  History of anxiety disorder Continue home  medications.  History of asthma Stable.  Continue home medications.  Tiny metallic foreign body in the cecum Incidentally detected on CT scan.  We will allow it to pass on its own.  May benefit from repeat imaging study in a few days time.  Cholelithiasis/Hepatic and right renal cyst Incidentally detected on CT scan.  Outpatient monitoring.  7 mm right lower lobe pulmonary nodule Non-contrast chest CT at 6-12 months is recommended. If the nodule is stable at time of repeat CT, then future CT at 18-24 months (from today's scan) is considered optional for low-risk patients, but is recommended for high-risk patients.   Patient improved.  Stable for discharge home today.    PERTINENT LABS:  The results of significant diagnostics from this hospitalization (including imaging, microbiology, ancillary and laboratory) are listed below for reference.     Labs: Basic Metabolic Panel: Recent Labs  Lab 07/11/17 1625 07/11/17 1633 07/12/17 0424 07/13/17 0415 07/14/17 0457 07/15/17 0858  NA 141 140 139 137 134* 137  K 4.2 4.2 3.7 4.8 5.1 4.8  CL 103 100* 102 100* 98* 100*  CO2 29  --  28 30 28 27   GLUCOSE 117* 116* 137* 124* 130* 113*  BUN 15 15 12 15 12 14   CREATININE 0.82 0.80 0.79 0.76 0.72 0.67  CALCIUM 9.1  --  8.7* 8.8* 8.7* 9.0   Liver Function Tests: Recent Labs  Lab 07/11/17 1625 07/12/17 0424  AST 25 21  ALT 22 21  ALKPHOS 100 92  BILITOT 0.5 0.7  PROT 7.4 6.9  ALBUMIN 3.7 3.4*   Recent Labs  Lab 07/11/17 1625  LIPASE 20   CBC: Recent Labs  Lab 07/11/17 1625 07/11/17 1633 07/12/17 0424 07/13/17 0415 07/14/17 0457 07/15/17 0858  WBC 7.4  --  8.6 11.1* 9.6 7.3  NEUTROABS 5.6  --   --   --   --   --   HGB 11.7* 12.2 11.0* 10.6* 9.8* 9.9*  HCT 36.7 36.0 35.3* 33.0* 31.2* 31.3*  MCV 90.8  --  92.9 91.7 92.0 91.8  PLT 324  --  303 270 276 342    IMAGING STUDIES Ct Abdomen Pelvis W Contrast  Result Date: 07/11/2017 CLINICAL DATA:  Right lower  quadrant pain EXAM: CT ABDOMEN AND PELVIS WITH CONTRAST TECHNIQUE: Multidetector CT imaging of the abdomen and pelvis was performed using the standard protocol following bolus administration of intravenous contrast. CONTRAST:  145mL ISOVUE-300 IOPAMIDOL (ISOVUE-300) INJECTION 61% COMPARISON:  10/23/2016 virtual colonoscopy. FINDINGS: Lower chest: 7 mm nodule identified medial right costophrenic sulcus (image 34/4). Hepatobiliary: 12 mm low-density lesion dome of left liver is stable and compatible with cyst. Liver otherwise unremarkable. 3.1 cm calcified gallstone evident. No intrahepatic or extrahepatic biliary dilation. Pancreas: No focal mass lesion. No dilatation of the main duct. No intraparenchymal cyst. No peripancreatic edema. Spleen: No splenomegaly. No focal mass lesion. Adrenals/Urinary Tract: No adrenal nodule or mass. 6.1 cm cyst in the upper pole right kidney is similar to prior. Right kidney otherwise unremarkable. Left kidney unremarkable. No evidence for hydroureter. Extrinsic mass-effect on the bladder which is otherwise unremarkable. Stomach/Bowel: Small hiatal hernia. Duodenum is normally positioned as is the ligament of Treitz. No small bowel wall thickening. No small bowel dilatation. The terminal ileum is normal. The appendix is not visualized, but there is no edema or  inflammation in the region of the cecum. Small metallic foreign body in the cecum compatible with ingested foreign body. Diverticular changes are noted in the left colon without evidence of diverticulitis. Vascular/Lymphatic: There is abdominal aortic atherosclerosis without aneurysm. Mild haziness in the central small bowel mesentery is nonspecific. There is no gastrohepatic or hepatoduodenal ligament lymphadenopathy. No intraperitoneal or retroperitoneal lymphadenopathy. No pelvic sidewall lymphadenopathy. Reproductive: The uterus has normal CT imaging appearance. There is no adnexal mass. Other: No intraperitoneal free fluid.  Musculoskeletal: 15.6 x 9.7 x 6.2 cm heterogeneous mass is identified in the lower right rectus sheath. Imaging features most suggestive of rectus sheath hematoma. There is a tiny focus of hyper attenuation (image 64 series 2 and coronal image 37 series 5) suggesting continued extravasation/bleeding. There is some edema/hemorrhage in the extraperitoneal anterior pelvis. The hematoma generates mass-effect on the bladder. Small bilateral groin hernias contain only fat. Degenerative disc disease noted L4-5. IMPRESSION: 1. 16 x 10 x 6 cm heterogeneous mass in the lower right rectus sheath has CT imaging features most consistent with rectus sheath hematoma. There is a small blush of contrast extravasation along the cranial portion of the hematoma suggesting continued bleeding. 2. Tiny metallic foreign body in the cecum, likely ingested. 3. Cholelithiasis. 4. Hepatic and right renal cyst. 5. 7 mm right lower lobe pulmonary nodule. Non-contrast chest CT at 6-12 months is recommended. If the nodule is stable at time of repeat CT, then future CT at 18-24 months (from today's scan) is considered optional for low-risk patients, but is recommended for high-risk patients. This recommendation follows the consensus statement: Guidelines for Management of Incidental Pulmonary Nodules Detected on CT Images: From the Fleischner Society 2017; Radiology 2017; 284:228-243. 6.  Aortic Atherosclerois (ICD10-170.0) I personally discussed the presence of the rectus sheath hematoma with contrast extravasation by telephone with Dr. Tyrone Nine at 1858 hours on 07/11/2017. Electronically Signed   By: Misty Stanley M.D.   On: 07/11/2017 18:58    DISCHARGE EXAMINATION: Vitals:   07/14/17 1950 07/14/17 2025 07/15/17 0450 07/15/17 1244  BP:  (!) 110/53 (!) 114/56 (!) 116/46  Pulse:  74 70 81  Resp:  19 18 18   Temp:  98.1 F (36.7 C) 98.7 F (37.1 C) 98.7 F (37.1 C)  TempSrc:  Oral Oral Oral  SpO2: 96% 100% 100% 94%  Weight:   (!) 233.3  kg (514 lb 5.3 oz)   Height:       General appearance: alert, cooperative and no distress Resp: clear to auscultation bilaterally Cardio: regular rate and rhythm, S1, S2 normal, no murmur, click, rub or gallop GI: soft, non-tender; bowel sounds normal; no masses,  no organomegaly Extremities: extremities normal, atraumatic, no cyanosis or edema Continues to have a vesicular lesion anteriorly over the T11 dermatome.  Dried lesions noted posteriorly.  DISPOSITION: Home with husband  Discharge Instructions    Call MD for:  difficulty breathing, headache or visual disturbances   Complete by:  As directed    Call MD for:  extreme fatigue   Complete by:  As directed    Call MD for:  persistant dizziness or light-headedness   Complete by:  As directed    Call MD for:  persistant nausea and vomiting   Complete by:  As directed    Call MD for:  severe uncontrolled pain   Complete by:  As directed    Call MD for:  temperature >100.4   Complete by:  As directed    Discharge instructions  Complete by:  As directed    Please ask your PCP to consider referring you to gastroenterology for further management of constipation if symptoms do not get better. Keep the shingles lesions covered. Take warfarin as instructed from 07/16/17. Seek attention if abdominal pain worsens or if you notice any bleeding or black stools.  You were cared for by a hospitalist during your hospital stay. If you have any questions about your discharge medications or the care you received while you were in the hospital after you are discharged, you can call the unit and asked to speak with the hospitalist on call if the hospitalist that took care of you is not available. Once you are discharged, your primary care physician will handle any further medical issues. Please note that NO REFILLS for any discharge medications will be authorized once you are discharged, as it is imperative that you return to your primary care physician  (or establish a relationship with a primary care physician if you do not have one) for your aftercare needs so that they can reassess your need for medications and monitor your lab values. If you do not have a primary care physician, you can call [phone] for a physician referral.   Increase activity slowly   Complete by:  As directed         Allergies as of 07/15/2017      Reactions   Codeine Anaphylaxis   Jerking, involuntary jerking   Beta Adrenergic Blockers    Makes her crazy Makes her crazy Makes her crazy   Metoprolol    depression depression   Propoxyphene Other (See Comments)   Bad vivid dreams Bad vivid dreams   Toprol Xl [metoprolol Tartrate]    depression   Dilantin [phenytoin Sodium Extended] Rash, Itching      Medication List    STOP taking these medications   amoxicillin 500 MG capsule Commonly known as:  AMOXIL   diazepam 10 MG tablet Commonly known as:  VALIUM   mometasone-formoterol 100-5 MCG/ACT Aero Commonly known as:  DULERA     TAKE these medications   ALPRAZolam 0.5 MG tablet Commonly known as:  XANAX Take 0.5 mg by mouth 2 (two) times daily as needed. AS NEEDED FOR ANXIETY   amiodarone 200 MG tablet Commonly known as:  PACERONE TAKE 1 TABLET BY MOUTH DAILY   benzonatate 100 MG capsule Commonly known as:  TESSALON Take 1 capsule (100 mg total) by mouth 3 (three) times daily as needed for cough.   bisacodyl 10 MG suppository Commonly known as:  DULCOLAX Place 1 suppository (10 mg total) rectally as needed for moderate constipation.   budesonide-formoterol 80-4.5 MCG/ACT inhaler Commonly known as:  SYMBICORT Inhale 2 puffs into the lungs 2 (two) times daily.   calcium-vitamin D 500-200 MG-UNIT tablet Commonly known as:  OSCAL WITH D Take 2 tablets by mouth daily.   DILT-XR 240 MG 24 hr capsule Generic drug:  diltiazem TAKE 1 CAPSULE BY MOUTH EVERY MORNING ON AN EMPTY STOMACH   docusate sodium 100 MG capsule Commonly known as:   COLACE Take 1 capsule (100 mg total) by mouth 2 (two) times daily.   DULoxetine 60 MG capsule Commonly known as:  CYMBALTA Take 60 mg by mouth daily.   furosemide 80 MG tablet Commonly known as:  LASIX Take 1 tablet (80 mg total) by mouth daily.   levothyroxine 100 MCG tablet Commonly known as:  SYNTHROID, LEVOTHROID Take 100 mcg daily before breakfast by mouth.  losartan 100 MG tablet Commonly known as:  COZAAR Take 1 tablet (100 mg total) by mouth daily.   multivitamin with minerals Tabs tablet Take 1 tablet by mouth daily.   polyethylene glycol packet Commonly known as:  MIRALAX / GLYCOLAX Take three times daily till you have regular bowel movements   potassium chloride SA 20 MEQ tablet Commonly known as:  K-DUR,KLOR-CON TAKE 2 TABLETS BY MOUTH EVERY MORNING AND 1 TABLET EVERY EVENING   pravastatin 10 MG tablet Commonly known as:  PRAVACHOL Take 10 mg by mouth daily.   senna-docusate 8.6-50 MG tablet Commonly known as:  Senokot-S Take 2 tablets by mouth 2 (two) times daily.   tretinoin 0.05 % cream Commonly known as:  RETIN-A Apply 1 application topically at bedtime.   valACYclovir 1000 MG tablet Commonly known as:  VALTREX Take 1 tablet (1,000 mg total) by mouth 3 (three) times daily for 7 days.   vitamin C 1000 MG tablet Take 2,000 mg by mouth daily.   Vitamin D 2000 units tablet Take 4,000 Units by mouth daily.   warfarin 4 MG tablet Commonly known as:  COUMADIN Take as directed. If you are unsure how to take this medication, talk to your nurse or doctor. Original instructions:  TAKE AS DIRECTED PER COUMADIN CLINIC. What changed:  See the new instructions.   zolpidem 10 MG tablet Commonly known as:  AMBIEN Take 5 mg by mouth at bedtime as needed for sleep.        Follow-up Information    Maurice Small, MD. Schedule an appointment as soon as possible for a visit in 1 week(s).   Specialty:  Family Medicine Contact information: [place] [address] [phone]        [name], MD .   Specialty:  Cardiology Contact information: [phone] [address] [place] [address] [phone]           TOTAL DISCHARGE TIME: 35 minutes  Bonnielee Haff  [facility] Pager [phone]  07/15/2017, 3:33 PM

## 2017-07-15 NOTE — Discharge Instructions (Signed)

## 2017-07-15 NOTE — Progress Notes (Signed)
Patient remains a&ox4, ambulatory without assist. Discharge instructions reviewed. Questions, concerns denied. Hard prescriptions given

## 2017-07-15 NOTE — Progress Notes (Addendum)
Progress Note  Patient Name: [name] Date of Encounter: 07/15/2017  Primary Cardiologist: Fransico Him, MD   Subjective   From a cardiac standpoint she is doing well. Denies CP, palpitations, or SOB. Denies active bleeding. Still with constipation. Last BM was Thursday despite aggressive bowel regimen over the weekend (enema, miralax, senna, and colace).   Inpatient Medications    Scheduled Meds: . amiodarone  200 mg Oral Daily  . benzonatate  100 mg Oral TID  . chlorhexidine  15 mL Mouth Rinse BID  . diltiazem  240 mg Oral Daily  . docusate sodium  100 mg Oral BID  . DULoxetine  60 mg Oral Daily  . furosemide  80 mg Oral Daily  . levothyroxine  100 mcg Oral QAC breakfast  . losartan  100 mg Oral Daily  . mouth rinse  15 mL Mouth Rinse q12n4p  . mometasone-formoterol  2 puff Inhalation BID  . polyethylene glycol  17 g Oral BID  . potassium chloride  20 mEq Oral QHS  . pravastatin  10 mg Oral Daily  . senna-docusate  2 tablet Oral QHS  . sodium chloride flush  3 mL Intravenous Q12H  . tretinoin  1 application Topical QHS  . valACYclovir  1,000 mg Oral TID   Continuous Infusions: . sodium chloride     PRN Meds: sodium chloride, acetaminophen, diazepam, guaiFENesin-dextromethorphan, sodium chloride flush, traMADol, zolpidem   Vital Signs    Vitals:   07/14/17 1242 07/14/17 1950 07/14/17 2025 07/15/17 0450  BP: (!) 116/55  (!) 110/53 (!) 114/56  Pulse: 76  74 70  Resp: 20  19 18   Temp: 98.7 F (37.1 C)  98.1 F (36.7 C) 98.7 F (37.1 C)  TempSrc: Oral  Oral Oral  SpO2: 96% 96% 100% 100%  Weight:    (!) 514 lb 5.3 oz (233.3 kg)  Height:        Intake/Output Summary (Last 24 hours) at 07/15/2017 0815 Last data filed at 07/15/2017 0200 Gross per 24 hour  Intake 600 ml  Output 300 ml  Net 300 ml   Filed Weights   07/13/17 0440 07/14/17 0430 07/15/17 0450  Weight: 231 lb 1.6 oz (104.8 kg) 234 lb 6.4 oz (106.3 kg) (!) 514 lb 5.3 oz (233.3 kg)     Telemetry    Sinus rhythm with 1st degree AV block - Personally Reviewed  Physical Exam   GEN: Obese female laying in bed in no acute distress.   Neck: No JVD, no carotid bruits Cardiac: RRR, no murmurs, rubs, or gallops.  Respiratory: Clear to auscultation bilaterally, no wheezes/ rales/ rhonchi GI: NABS, obese, soft, nontender, non-distended  MS: No edema; No deformity. Neuro:  Nonfocal, moving all extremities spontaneously Psych: Normal affect   Labs    Chemistry Recent Labs  Lab 07/11/17 1625  07/12/17 0424 07/13/17 0415 07/14/17 0457  NA 141   < > 139 137 134*  K 4.2   < > 3.7 4.8 5.1  CL 103   < > 102 100* 98*  CO2 29  --  28 30 28   GLUCOSE 117*   < > 137* 124* 130*  BUN 15   < > 12 15 12   CREATININE 0.82   < > 0.79 0.76 0.72  CALCIUM 9.1  --  8.7* 8.8* 8.7*  PROT 7.4  --  6.9  --   --   ALBUMIN 3.7  --  3.4*  --   --   AST 25  --  21  --   --   ALT 22  --  21  --   --   ALKPHOS 100  --  92  --   --   BILITOT 0.5  --  0.7  --   --   GFRNONAA >60  --  >60 >60 >60  GFRAA >60  --  >60 >60 >60  ANIONGAP 9  --  9 7 8    < > = values in this interval not displayed.     Hematology Recent Labs  Lab 07/12/17 0424 07/13/17 0415 07/14/17 0457  WBC 8.6 11.1* 9.6  RBC 3.80* 3.60* 3.39*  HGB 11.0* 10.6* 9.8*  HCT 35.3* 33.0* 31.2*  MCV 92.9 91.7 92.0  MCH 28.9 29.4 28.9  MCHC 31.2 32.1 31.4  RDW 14.8 14.7 14.8  PLT 303 270 276    Cardiac EnzymesNo results for input(s): TROPONINI in the last 168 hours. No results for input(s): TROPIPOC in the last 168 hours.   BNPNo results for input(s): BNP, PROBNP in the last 168 hours.   DDimer No results for input(s): DDIMER in the last 168 hours.   Radiology    No results found.  Cardiac Studies   Echocardiogram 06/2016: Study Conclusions  - Left ventricle: The cavity size was mildly dilated. There was   mild concentric hypertrophy. Systolic function was normal. The   estimated ejection fraction was in  the range of 55% to 60%. Wall   motion was normal; there were no regional wall motion   abnormalities. Doppler parameters are consistent with high   ventricular filling pressure. - Aortic valve: A mechanical prosthesis was present. Transvalvular   velocity mildly elevated. There was no regurgitation. Peak   velocity (S): 288 cm/s. Mean gradient (S): 20 mm Hg. - Aorta: Ascending aortic diameter: 44 mm (S). - Ascending aorta: The ascending aorta was mildly dilated. - Mitral valve: Mildly calcified annulus. Transvalvular velocity   was within the normal range. There was no evidence for stenosis.   There was mild to moderate regurgitation. - Left atrium: The atrium was severely dilated. - Right ventricle: The cavity size was normal. Wall thickness was   normal. Systolic function was normal. - Atrial septum: No defect or patent foramen ovale was identified   by color flow Doppler. - Tricuspid valve: There was mild regurgitation. - Pulmonary arteries: Systolic pressure was within the normal   range. PA peak pressure: 27 mm Hg (S).  Patient Profile     72 y.o. female with a hx of mechanical aortic valve--for AS, on coumadin, PAF with a fib ablation and underwent tikosyn load, repeat ablation at [facility] 10/2015 and then changed to amiodarone, maintaining SR, OSA on CPAP who presented with RLQ pain and found to have a rectal sheath hematoma in the setting of constipation. Cardiology following for [facility] recommendations given mechanical aortic valve.     Assessment & Plan    1. Spontaneous rectus sheath hematoma: likely in the setting of constipation x3 months. Coumadin has been on hold and Hgb dropped to 9.8 yesterday (11.7 on admission); stable at 9.9 today.  - Continue to monitor closely  - Management per primary team  2. AS s/p AVR with mechanical valve: coumadin on hold given rectus sheath hematoma.  - Agree with restarting tomorrow (without bridging) if Hgb stable today. Goal INR 2-2.5 for  now. - Please notify cardiology when patient is ready for discharge and a coumadin clinic appointment will be arranged.   3. Paroxysmal atrial  fibrillation: maintaining SR on amiodarone - Continue diltiazem and amiodarone - Coumadin on hold - hopeful to restart tomorrow for CHADS2VASC score of 3  4. HTN: BP well controlled (on soft side) - Continue diltiazem, lasix, and losartan  5. Constipation: Last BM was Thursday despite aggressive bowel regimen - Continue management per primary team    For questions or updates, please contact [place] Please consult [email or web address] for contact info under Cardiology/STEMI.      Signed, Abigail Butts, PA-C  07/15/2017, 8:15 AM   [phone]  Personally seen and examined. Agree with above.  Feels well, sitting up in chair eating lunch.  No complaints.  Exam: Sharp S2 click, aortic valve mechanical, lungs clear, overweight, no significant edema, alert  Labs: Hemoglobin stable as above.  Assessment and plan:  Rectus sheath hematoma spontaneous -Stable hemoglobin.  Agree with restarting Coumadin tomorrow without any bridging.  Do not use Lovenox.  She takes her Coumadin in the evening.  Goal INR 2-2.5.  Mechanical aortic valve -As above.  Sharp click.  Stable.  Per guidelines, can effectively be off of anticoagulation for 7 days.  Do not bridge given her bleeding.  Paroxysmal atrial fibrillation -Amiodarone use.  Maintaining sinus rhythm.  Telemetry reviewed.  Plan in place.  Start Coumadin tomorrow.  We will sign off.  Please let us know if we can be of further assistance.  Needs Coumadin clinic follow-up once leaving.  [name], MD

## 2017-07-18 ENCOUNTER — Ambulatory Visit (INDEPENDENT_AMBULATORY_CARE_PROVIDER_SITE_OTHER): Payer: Medicare Other | Admitting: *Deleted

## 2017-07-18 DIAGNOSIS — I4819 Other persistent atrial fibrillation: Secondary | ICD-10-CM

## 2017-07-18 DIAGNOSIS — I481 Persistent atrial fibrillation: Secondary | ICD-10-CM

## 2017-07-18 DIAGNOSIS — Z5181 Encounter for therapeutic drug level monitoring: Secondary | ICD-10-CM | POA: Diagnosis not present

## 2017-07-18 LAB — POCT INR: INR: 1.1

## 2017-07-18 NOTE — Patient Instructions (Signed)
Description   Continue taking 1 tablet daily except 1/2 tablet on Tuesdays. Recheck INR in Monday. Keep intake of greens consistent.  Phone number [phone] Fax [phone]

## 2017-07-23 ENCOUNTER — Ambulatory Visit (INDEPENDENT_AMBULATORY_CARE_PROVIDER_SITE_OTHER): Payer: Medicare Other | Admitting: *Deleted

## 2017-07-23 DIAGNOSIS — D508 Other iron deficiency anemias: Secondary | ICD-10-CM | POA: Diagnosis not present

## 2017-07-23 DIAGNOSIS — Z952 Presence of prosthetic heart valve: Secondary | ICD-10-CM

## 2017-07-23 DIAGNOSIS — Z09 Encounter for follow-up examination after completed treatment for conditions other than malignant neoplasm: Secondary | ICD-10-CM | POA: Diagnosis not present

## 2017-07-23 DIAGNOSIS — I481 Persistent atrial fibrillation: Secondary | ICD-10-CM

## 2017-07-23 DIAGNOSIS — Z5181 Encounter for therapeutic drug level monitoring: Secondary | ICD-10-CM

## 2017-07-23 DIAGNOSIS — I4819 Other persistent atrial fibrillation: Secondary | ICD-10-CM

## 2017-07-23 LAB — POCT INR: INR: 1.4

## 2017-07-23 NOTE — Patient Instructions (Signed)
Description   Today April 16th take 1 tablet (4mg ) then tomorrow April 17th take 1 and 1/2 tablets (6mg ) then continue taking 1 tablet daily except 1/2 tablet on Tuesdays. Recheck INR in 1 week. Keep intake of greens consistent.  Phone number [phone] Fax [phone]

## 2017-07-29 DIAGNOSIS — R05 Cough: Secondary | ICD-10-CM | POA: Diagnosis not present

## 2017-07-31 ENCOUNTER — Telehealth: Payer: Self-pay

## 2017-07-31 DIAGNOSIS — Z800 Family history of malignant neoplasm of digestive organs: Secondary | ICD-10-CM | POA: Insufficient documentation

## 2017-07-31 DIAGNOSIS — K8020 Calculus of gallbladder without cholecystitis without obstruction: Secondary | ICD-10-CM | POA: Insufficient documentation

## 2017-07-31 DIAGNOSIS — E039 Hypothyroidism, unspecified: Secondary | ICD-10-CM | POA: Diagnosis not present

## 2017-07-31 DIAGNOSIS — K5901 Slow transit constipation: Secondary | ICD-10-CM | POA: Insufficient documentation

## 2017-07-31 DIAGNOSIS — D509 Iron deficiency anemia, unspecified: Secondary | ICD-10-CM | POA: Diagnosis not present

## 2017-07-31 NOTE — Telephone Encounter (Signed)
   [facility] Pre-operative Risk Assessment    Request for surgical clearance:  1. What type of surgery is being performed? Colonoscopy/EGD   2. When is this surgery scheduled? 08/13/17   3. What type of clearance is required (medical clearance vs. Pharmacy clearance to hold med vs. Both)? Both  4. Are there any medications that need to be held prior to surgery and how long?Coumadin   5. Practice name and name of physician performing surgery? [facility]   6. What is your office phone number [phone]    7.   What is your office fax number [phone]  8.   Anesthesia type (None, local, MAC, general) ? MAC   [name] 07/31/2017, 1:59 PM  _________________________________________________________________   (provider comments below)

## 2017-07-31 NOTE — Telephone Encounter (Signed)
Called to discuss colonoscopy and endoscopy with patient. Left message to call back.  She was recently admitted with bleeding issues and has a Dealer valve-  Is this an urgent procedure?  It's scheduled for 5/7, she has an appointment with Dr Radford Pax 5/22. Need to discuss further with the patient.  Kerin Ransom PA-C 07/31/2017 4:33 PM

## 2017-08-01 NOTE — Telephone Encounter (Signed)
   Primary Cardiologist: Fransico Him, MD  Chart reviewed as part of pre-operative protocol coverage.   Patient returned Luke's call, reports the colonoscopy is fairly urgent given recent issues with constipation. This is scheduled for 08/13/17. Her last OV with Dr. Radford Pax was in 01/2017. She was recently in the hospital for rectus sheath hematoma. She was due to see Dr. Radford Pax 5/22 but Dr. Radford Pax does have an open clinic slot on 08/05/17. Given recent complex history with bleeding/rectus sheath hematoma and mechanical valve replacement, I believe she would best be served touching base with her cardiologist in the office to help coordinate both clearance and Coumadin plans. The patient is amenable to coming on the 29th instead. Advised to arrive 15 mins early to check in and bring all meds with her. Will also route this message to requesting provider via Epic fax function. Please call with questions.  Charlie Pitter, PA-C 08/01/2017, 3:00 PM

## 2017-08-05 ENCOUNTER — Ambulatory Visit: Payer: Medicare Other | Admitting: Cardiology

## 2017-08-05 ENCOUNTER — Ambulatory Visit (INDEPENDENT_AMBULATORY_CARE_PROVIDER_SITE_OTHER): Payer: Medicare Other | Admitting: Cardiology

## 2017-08-05 VITALS — BP 128/74 | HR 69 | Ht 67.0 in | Wt 227.0 lb

## 2017-08-05 DIAGNOSIS — I481 Persistent atrial fibrillation: Secondary | ICD-10-CM | POA: Diagnosis not present

## 2017-08-05 DIAGNOSIS — I77810 Thoracic aortic ectasia: Secondary | ICD-10-CM | POA: Diagnosis not present

## 2017-08-05 DIAGNOSIS — I5032 Chronic diastolic (congestive) heart failure: Secondary | ICD-10-CM | POA: Diagnosis not present

## 2017-08-05 DIAGNOSIS — I350 Nonrheumatic aortic (valve) stenosis: Secondary | ICD-10-CM | POA: Diagnosis not present

## 2017-08-05 DIAGNOSIS — E785 Hyperlipidemia, unspecified: Secondary | ICD-10-CM | POA: Diagnosis not present

## 2017-08-05 DIAGNOSIS — I4819 Other persistent atrial fibrillation: Secondary | ICD-10-CM

## 2017-08-05 DIAGNOSIS — I10 Essential (primary) hypertension: Secondary | ICD-10-CM

## 2017-08-05 NOTE — Progress Notes (Addendum)
Cardiology Office Note:    Date:  08/05/2017   ID:  [name], DOB 10-10-1945, MRN [id number]  PCP:  Maurice Small, MD  Cardiologist:  Fransico Him, MD    Referring MD: Maurice Small, MD   Chief Complaint  Patient presents with  . Aortic Stenosis  . Hypertension  . Atrial Fibrillation  . Sleep Apnea    History of Present Illness:    [name] is a 72 y.o. female with a hx of afib s/p failed Tikosyn load and recurrent DCCV which failed to maintain NSR. She was loaded on amio and underwent recurrent DCCV to NSR but this did not hold and she reverted back to afib. She underwent afib ablation at [facility] was onTikosyn. She had recurrent afib and underwent repeat afib ablation 10/2015 at [facility] and is now on Amio. She has been maintaining NSR since then. She also has a history of OSA on CPAP at 12cm H2O, AS s/p mechanical AVR and chronic anticoagulation.   She was hospitalized 07/11/2017 with a rectus sheath hematoma felt secondary to straining from constipation.  She also had recently had pneumonia and had bad coughing episodes which were felt to contribute as well.  General surgery did not recommend any surgical intervention and warfarin was held.  Her hospital stay was complicated by herpes zoster in the T11 dermatome.  She was started on Valtrex.  She is now back on warfarin anticoagulation.  She is here today for followup and is doing well.  She is here today for cardiac clearance prior to having a colonoscopy/upper endoscopy done.  She has had problems recently with severe constipation, sometimes not going for 6 days.  Her Gi MD would like to hold her coumadin for 5 days prior to undergoing EGD.  She denies any chest pain or pressure,  PND, orthopnea, LE edema, dizziness, palpitations or syncope. She occasionally has some mild DOE.  She is compliant with her meds and is tolerating meds with no SE.    Past Medical History:  Diagnosis Date  . Anxiety   . Aortic stenosis    status post aortic valve replacement with St. Jude mechanical prosthesis  . 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   31mm by echo 06/2016 and 39mm by CT angin 2016  . Chronic anticoagulation   . 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 Depression   . DJD (degenerative joint disease) of knee   . Dyslipidemia   . GERD (gastroesophageal reflux disease)    "several years ago; none since" (11/12/2012)  . Hyperlipidemia   . Hyperlipidemia LDL goal <70 01/19/2017  . Hypertension   . Left atrial enlargement   . Mitral regurgitation 06/25/2016   Mild moderate MR by echo 06/2016  . Obesity   . Persistent atrial fibrillation (HCC)    s/p afib ablation x 2 at [facility]  . Sleep apnea    On CPAP at 12cm H2O  . 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 - now cleared by neurosurgery to maintain INR 2-2.5    Past Surgical History:  Procedure Laterality Date  . BURR HOLE FOR SUBDURAL HEMATOMA  2006  . CARDIAC VALVE REPLACEMENT  2006   St. Jude AVR  . CARDIOVERSION N/A 07/22/2012   Procedure: CARDIOVERSION;  Surgeon: [name], MD;  Location: [facility] ENDOSCOPY;  Service: Cardiovascular;  Laterality: N/A;  . CARDIOVERSION N/A 11/25/2012   Procedure: CARDIOVERSION;  Surgeon: [name], MD;  Location: [facility];  Service: Cardiovascular;  Laterality: N/A;  . CARDIOVERSION N/A 12/30/2012   Procedure: CARDIOVERSION;  Surgeon: [name], MD;  Location: [facility] ENDOSCOPY;  Service: Cardiovascular;  Laterality: N/A;  h&p in file-HW   . CARDIOVERSION N/A 03/30/2013   Procedure: CARDIOVERSION;  Surgeon: [name], MD;  Location: [facility] ENDOSCOPY;  Service: Cardiovascular;  Laterality: N/A;  . ELECTROPHYSIOLOGIC STUDY  11/2013   Afib ablation x 2 (11/2013 and 10/2015) at [facility] by Dr Clyda Hurdle with recurrence post ablation  . KNEE ARTHROSCOPY Left 1980's   "2" (11/12/2012)  . KNEE SURGERY Left 1980's   "after 2 scopes they went in and did some  kind of OR" (11/12/2012)  . TEE WITHOUT CARDIOVERSION N/A 07/22/2012   Procedure: TRANSESOPHAGEAL ECHOCARDIOGRAM (TEE);  Surgeon: [name], MD;  Location: [facility] ENDOSCOPY;  Service: Cardiovascular;  Laterality: N/A;  Rm 2034  . TEE WITHOUT CARDIOVERSION N/A 10/07/2013   Procedure: TRANSESOPHAGEAL ECHOCARDIOGRAM (TEE);  Surgeon: [name], MD;  Location: [facility] ENDOSCOPY;  Service: Cardiovascular;  Laterality: N/A;  . TOTAL KNEE ARTHROPLASTY Left 11/05/2014   Procedure: TOTAL KNEE ARTHROPLASTY;  Surgeon: Dorna Leitz, MD;  Location: [facility];  Service: Orthopedics;  Laterality: Left;  . TUBAL LIGATION  1980's    Current Medications: Current Meds  Medication Sig  . ALPRAZolam (XANAX) 0.5 MG tablet Take 0.5 mg by mouth 2 (two) times daily as needed. AS NEEDED FOR ANXIETY  . amiodarone (PACERONE) 200 MG tablet TAKE 1 TABLET BY MOUTH DAILY  . Ascorbic Acid (VITAMIN C) 1000 MG tablet Take 2,000 mg by mouth daily.  . benzonatate (TESSALON) 100 MG capsule Take 1 capsule (100 mg total) by mouth 3 (three) times daily as needed for cough.  . bisacodyl (DULCOLAX) 10 MG suppository Place 1 suppository (10 mg total) rectally as needed for moderate constipation.  . budesonide-formoterol (SYMBICORT) 80-4.5 MCG/ACT inhaler Inhale 2 puffs into the lungs 2 (two) times daily.  . calcium-vitamin D (OSCAL WITH D) 500-200 MG-UNIT per tablet Take 2 tablets by mouth daily.   . Cholecalciferol (VITAMIN D) 2000 UNITS tablet Take 4,000 Units by mouth daily.  Marland Kitchen DILT-XR 240 MG 24 hr capsule TAKE 1 CAPSULE BY MOUTH EVERY MORNING ON AN EMPTY STOMACH  . DULoxetine (CYMBALTA) 60 MG capsule Take 60 mg by mouth daily.  . furosemide (LASIX) 80 MG tablet Take 1 tablet (80 mg total) by mouth daily.  Marland Kitchen levothyroxine (SYNTHROID, LEVOTHROID) 100 MCG tablet Take 100 mcg daily before breakfast by mouth.  . Multiple Vitamin (MULTIVITAMIN WITH MINERALS) TABS Take 1 tablet by mouth daily.  . polyethylene glycol (MIRALAX / GLYCOLAX) packet Take  three times daily till you have regular bowel movements  . potassium chloride SA (K-DUR,KLOR-CON) 20 MEQ tablet TAKE 2 TABLETS BY MOUTH EVERY MORNING AND 1 TABLET EVERY EVENING  . pravastatin (PRAVACHOL) 10 MG tablet Take 10 mg by mouth daily.  Marland Kitchen tretinoin (RETIN-A) 0.05 % cream Apply 1 application topically at bedtime.   Marland Kitchen warfarin (COUMADIN) 4 MG tablet TAKE AS DIRECTED PER COUMADIN CLINIC. (Patient taking differently: Take 4 mg by mouth once a day except for Wednesday. On Wednesday, take 2 mg by mouth once a day.)  . zolpidem (AMBIEN) 10 MG tablet Take 5 mg by mouth at bedtime as needed for sleep.     Allergies:   Codeine; Beta adrenergic blockers; Metoprolol; Propoxyphene; Toprol xl [metoprolol tartrate]; and Dilantin [phenytoin sodium extended]   Social History   Socioeconomic History  .  Marital status: Married    Spouse name: Not on file  . Number of children: Not on file  . Years of education: Not on file  . Highest education level: Not on file  Occupational History  . Not on file  Social Needs  . Financial resource strain: Not on file  . Food insecurity:    Worry: Not on file    Inability: Not on file  . Transportation needs:    Medical: Not on file    Non-medical: Not on file  Tobacco Use  . Smoking status: Never Smoker  . Smokeless tobacco: Never Used  Substance and Sexual Activity  . Alcohol use: No    Comment: 11/12/2012 "no alcohol in the last 9 years"  . Drug use: No  . Sexual activity: Not Currently  Lifestyle  . Physical activity:    Days per week: Not on file    Minutes per session: Not on file  . Stress: Not on file  Relationships  . Social connections:    Talks on phone: Not on file    Gets together: Not on file    Attends religious service: Not on file    Active member of club or organization: Not on file    Attends meetings of clubs or organizations: Not on file    Relationship status: Not on file  Other Topics Concern  . Not on file  Social  History Narrative   Pt lives in [place] with spouse.  Retired     Family History: The patient's family history includes Heart disease in her brother; Hyperlipidemia in her father; Hypertension in her father and mother; Lung cancer in her mother.  ROS:   Please see the history of present illness.    ROS  All other systems reviewed and negative.   EKGs/Labs/Other Studies Reviewed:    The following studies were reviewed today: Hospital records from admission 07/16/2017  EKG:  EKG is ordered today and showed NSR at 69bpm with nonspecific ST abnormality  Recent Labs: 07/12/2017: ALT 21 07/13/2017: TSH 2.968 07/15/2017: BUN 14; Creatinine, Ser 0.67; Hemoglobin 9.9; Platelets 342; Potassium 4.8; Sodium 137   Recent Lipid Panel    Component Value Date/Time   CHOL 201 (H) 06/25/2016 1011   TRIG 95 06/25/2016 1011   HDL 72 06/25/2016 1011   CHOLHDL 2.8 06/25/2016 1011   LDLCALC 110 (H) 06/25/2016 1011    Physical Exam:    VS:  BP 128/74   Pulse 69   Ht 5\' 7"  (1.702 m)   Wt 227 lb (103 kg)   BMI 35.55 kg/m     Wt Readings from Last 3 Encounters:  08/05/17 227 lb (103 kg)  07/15/17 (!) 514 lb 5.3 oz (233.3 kg)  04/25/17 231 lb (104.8 kg)     GEN:  Well nourished, well developed in no acute distress HEENT: Normal NECK: No JVD; No carotid bruits LYMPHATICS: No lymphadenopathy CARDIAC: RRR, no murmurs, rubs, gallops RESPIRATORY:  Clear to auscultation without rales, wheezing or rhonchi  ABDOMEN: Soft, non-tender, non-distended MUSCULOSKELETAL:  No edema; No deformity  SKIN: Warm and dry NEUROLOGIC:  Alert and oriented x 3 PSYCHIATRIC:  Normal affect   ASSESSMENT:    1. Persistent atrial fibrillation ([place])   2. Essential hypertension   3. Chronic diastolic CHF (congestive heart failure) ([place])   4. Nonrheumatic aortic valve stenosis   5. Ascending aorta dilatation (HCC)   6. Hyperlipidemia LDL goal <70    PLAN:    In order  of problems listed above:  1.  Persistent  atrial fibrillation -she is maintaining normal sinus rhythm on exam today.  She will continue on amiodarone 200 mg daily, Cardizem 240 mg daily, warfarin.  TSH was normal at 1.4 on 07/13/2017 and LFTs normal as well.  QTc on EKG today is 48msec.  Hemoglobin was 9.9 after hospitalization for rectus sheath hematoma.  2.  Hypertension - Her blood pressure is adequately controlled on exam today.  She will continue on diltiazem XR 240 mg daily.  3.  Chronic diastolic CHF -she appears euvolemic on exam today and her weight is stable.  She will continue on Lasix 80 mg daily.  Creatinine was normal at 0.6748 2019  4.  Severe AS s/p mechanical AVR -she was off warfarin for a few days due to a rectus sheath hematoma but is now back on warfarin.  5.  Ascending aortic dilatation -44 mm by echo 06/11/2016.  I will repeat a 2D echocardiogram to make sure this is not increased in size.  6.  Hyperlipidemia with LDL goal < 70.  I will get an FLP and ALT.  She will continue on pravastatin 10mg  daily.    I think she is stable from a cardiac standpoint to proceed with upper endoscopy and colonoscopy to evaluate her constipation.  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  She has been instructed to restart Coumadin as soon as she can per GI recommendations.  I reviewed my notes I guess they can just write on it Medication Adjustments/Labs and Tests Ordered: Current medicines are reviewed at length with the patient today.  Concerns regarding medicines are outlined above.  Orders Placed This Encounter  Procedures  . EKG 12-Lead   No orders of the defined types were placed in this encounter.   Signed, Fransico Him, MD  08/05/2017 1:43 PM    [place]

## 2017-08-05 NOTE — Patient Instructions (Signed)
Medication Instructions:  Your physician recommends that you continue on your current medications as directed. Please refer to the Current Medication list given to you today.  Labwork: Your physician recommends that you return for lab work in: 1 week for liver function and cholesterol    Testing/Procedures: Your physician has requested that you have an echocardiogram. Echocardiography is a painless test that uses sound waves to create images of your heart. It provides your doctor with information about the size and shape of your heart and how well your heart's chambers and valves are working. This procedure takes approximately one hour. There are no restrictions for this procedure.   Follow-Up: Your physician wants you to follow-up in: 6 months with Dr. Radford Pax. You will receive a reminder letter in the mail two months in advance. If you don't receive a letter, please call our office to schedule the follow-up appointment.  Any Other Special Instructions Will Be Listed Below (If Applicable).    Thank you for choosing [facility], RN  [phone]  If you need a refill on your cardiac medications before your next appointment, please call your pharmacy.

## 2017-08-06 ENCOUNTER — Telehealth: Payer: Self-pay | Admitting: Cardiology

## 2017-08-06 NOTE — Telephone Encounter (Signed)
Note       [facility] Pre-operative Risk Assessment    Request for surgical clearance:  1. What type of surgery is being performed? Colonoscopy/EGD   2. When is this surgery scheduled? 08/13/17   3. What type of clearance is required (medical clearance vs. Pharmacy clearance to hold med vs. Both)? Both  4. Are there any medications that need to be held prior to surgery and how long?Coumadin   5. Practice name and name of physician performing surgery? [facility]   6. What is your office phone number [phone]    7.   What is your office fax number [phone]  8.   Anesthesia type (None, local, MAC, general) ? MAC   [name] 07/31/2017, 1:59 PM

## 2017-08-06 NOTE — Telephone Encounter (Signed)
Routed to pharmacy for guidance on coumadin.

## 2017-08-06 NOTE — Telephone Encounter (Signed)
See instructions given to patient by DR Radford Pax during office yesterday 08/05/2017:  "I think she is stable from a cardiac standpoint to proceed with upper endoscopy and colonoscopy to evaluate her constipation.  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  She has been instructed to restart Coumadin as soon as she can per GI recommendations."

## 2017-08-07 NOTE — Telephone Encounter (Signed)
  I will route this recommendation to the requesting party via Epic fax function and remove from pre-op pool.  Please call with questions.  [place], [place] 08/07/2017, 1:57 PM

## 2017-08-09 ENCOUNTER — Ambulatory Visit (HOSPITAL_COMMUNITY): Payer: Medicare Other | Attending: Cardiology

## 2017-08-09 ENCOUNTER — Other Ambulatory Visit: Payer: Self-pay

## 2017-08-09 ENCOUNTER — Other Ambulatory Visit: Payer: Medicare Other | Admitting: *Deleted

## 2017-08-09 DIAGNOSIS — I77810 Thoracic aortic ectasia: Secondary | ICD-10-CM | POA: Diagnosis not present

## 2017-08-09 DIAGNOSIS — G4730 Sleep apnea, unspecified: Secondary | ICD-10-CM | POA: Diagnosis not present

## 2017-08-09 DIAGNOSIS — I4891 Unspecified atrial fibrillation: Secondary | ICD-10-CM | POA: Diagnosis not present

## 2017-08-09 DIAGNOSIS — I119 Hypertensive heart disease without heart failure: Secondary | ICD-10-CM | POA: Insufficient documentation

## 2017-08-09 DIAGNOSIS — I340 Nonrheumatic mitral (valve) insufficiency: Secondary | ICD-10-CM | POA: Insufficient documentation

## 2017-08-09 DIAGNOSIS — Z952 Presence of prosthetic heart valve: Secondary | ICD-10-CM | POA: Insufficient documentation

## 2017-08-09 DIAGNOSIS — E785 Hyperlipidemia, unspecified: Secondary | ICD-10-CM | POA: Insufficient documentation

## 2017-08-09 LAB — LIPID PANEL
CHOL/HDL RATIO: 2.8 ratio (ref 0.0–4.4)
Cholesterol, Total: 181 mg/dL (ref 100–199)
HDL: 64 mg/dL (ref >39)
LDL CALC: 101 mg/dL — AB (ref 0–99)
Triglycerides: 79 mg/dL (ref 0–149)
VLDL Cholesterol Cal: 16 mg/dL (ref 5–40)

## 2017-08-09 LAB — HEPATIC FUNCTION PANEL
ALT: 16 IU/L (ref 0–32)
AST: 21 IU/L (ref 0–40)
Albumin: 4.2 g/dL (ref 3.5–4.8)
Alkaline Phosphatase: 112 IU/L (ref 39–117)
BILIRUBIN TOTAL: 0.4 mg/dL (ref 0.0–1.2)
Bilirubin, Direct: 0.1 mg/dL (ref 0.00–0.40)
TOTAL PROTEIN: 6.9 g/dL (ref 6.0–8.5)

## 2017-08-12 ENCOUNTER — Encounter: Payer: Self-pay | Admitting: Cardiology

## 2017-08-12 DIAGNOSIS — Z7901 Long term (current) use of anticoagulants: Secondary | ICD-10-CM | POA: Diagnosis not present

## 2017-08-13 ENCOUNTER — Telehealth: Payer: Self-pay

## 2017-08-13 DIAGNOSIS — D509 Iron deficiency anemia, unspecified: Secondary | ICD-10-CM | POA: Diagnosis not present

## 2017-08-13 DIAGNOSIS — K449 Diaphragmatic hernia without obstruction or gangrene: Secondary | ICD-10-CM | POA: Diagnosis not present

## 2017-08-13 DIAGNOSIS — Z800 Family history of malignant neoplasm of digestive organs: Secondary | ICD-10-CM | POA: Diagnosis not present

## 2017-08-13 DIAGNOSIS — Z1211 Encounter for screening for malignant neoplasm of colon: Secondary | ICD-10-CM | POA: Diagnosis not present

## 2017-08-13 DIAGNOSIS — E785 Hyperlipidemia, unspecified: Secondary | ICD-10-CM

## 2017-08-13 DIAGNOSIS — K648 Other hemorrhoids: Secondary | ICD-10-CM | POA: Diagnosis not present

## 2017-08-13 DIAGNOSIS — R05 Cough: Secondary | ICD-10-CM | POA: Diagnosis not present

## 2017-08-13 DIAGNOSIS — K5730 Diverticulosis of large intestine without perforation or abscess without bleeding: Secondary | ICD-10-CM | POA: Diagnosis not present

## 2017-08-13 DIAGNOSIS — I7121 Aneurysm of the ascending aorta, without rupture: Secondary | ICD-10-CM

## 2017-08-13 DIAGNOSIS — I712 Thoracic aortic aneurysm, without rupture: Secondary | ICD-10-CM

## 2017-08-13 MED ORDER — PRAVASTATIN SODIUM 20 MG PO TABS: 20.0000 mg | ORAL_TABLET | Freq: Every evening | ORAL | 11 refills | Status: DC

## 2017-08-13 NOTE — Telephone Encounter (Signed)
Notes recorded by Teressa Senter, RN on 08/13/2017 at 12:22 PM EDT Patient made aware of echo results. Patient informed to repeat echo in a 1 year. Patient verbalized understanding and thankful for the call. Echo ordered to be scheduled in 1 year (07/2018)  Notes recorded by [name], MD on 08/12/2017 at 5:16 PM EDT Echo showed normal LVF with stable mechanical AVR and moderately dilated ascending aorta at 96mm. MVP with mild to moderate MR. No significant change in ascending aorta since 2017 chest CT (4.79mm) and unchanged from echo 2018. Repeat echo in 1 year   Notes recorded by Teressa Senter, RN on 08/13/2017 at 12:30 PM EDT Patient made aware of lab results. Patient instructed to INCREASE pravastatin to 20 mg once a day and repeat FLP and ALT. Patient verbalized understanding and scheduled for repeat labs on 10/17/17.   Notes recorded by [name], MD on 08/12/2017 at 5:49 PM EDT Increase pravastatin to 20mg  daily and repeat FLP and ALT in 6 weeks

## 2017-08-28 ENCOUNTER — Other Ambulatory Visit: Payer: Self-pay | Admitting: Cardiology

## 2017-08-28 ENCOUNTER — Other Ambulatory Visit: Payer: Self-pay | Admitting: Family Medicine

## 2017-08-28 DIAGNOSIS — Z1231 Encounter for screening mammogram for malignant neoplasm of breast: Secondary | ICD-10-CM

## 2017-08-30 ENCOUNTER — Ambulatory Visit: Payer: Medicare Other | Admitting: Cardiology

## 2017-09-05 ENCOUNTER — Other Ambulatory Visit: Payer: Self-pay | Admitting: *Deleted

## 2017-09-06 ENCOUNTER — Ambulatory Visit (INDEPENDENT_AMBULATORY_CARE_PROVIDER_SITE_OTHER): Payer: Medicare Other | Admitting: Pharmacist

## 2017-09-06 DIAGNOSIS — I481 Persistent atrial fibrillation: Secondary | ICD-10-CM | POA: Diagnosis not present

## 2017-09-06 DIAGNOSIS — Z5181 Encounter for therapeutic drug level monitoring: Secondary | ICD-10-CM | POA: Diagnosis not present

## 2017-09-06 DIAGNOSIS — I4819 Other persistent atrial fibrillation: Secondary | ICD-10-CM

## 2017-09-06 LAB — POCT INR: INR: 2.7 (ref 2.0–3.0)

## 2017-09-06 MED ORDER — WARFARIN SODIUM 4 MG PO TABS: ORAL_TABLET | ORAL | 0 refills | Status: DC

## 2017-09-06 NOTE — Patient Instructions (Signed)
Description   Take 1/2 tablet today,then continue taking 1 tablet daily except 1/2 tablet on Tuesdays. Recheck INR in 3 weeks. Keep intake of greens consistent.  Phone number [phone] Fax [phone]

## 2017-09-17 DIAGNOSIS — H2513 Age-related nuclear cataract, bilateral: Secondary | ICD-10-CM | POA: Diagnosis not present

## 2017-09-20 ENCOUNTER — Ambulatory Visit
Admission: RE | Admit: 2017-09-20 | Discharge: 2017-09-20 | Disposition: A | Discharge status: Home / Self Care | Payer: Medicare Other | Source: Ambulatory Visit | Attending: Family Medicine | Admitting: Family Medicine

## 2017-09-20 DIAGNOSIS — Z1231 Encounter for screening mammogram for malignant neoplasm of breast: Secondary | ICD-10-CM | POA: Diagnosis not present

## 2017-09-27 DIAGNOSIS — S52572A Other intraarticular fracture of lower end of left radius, initial encounter for closed fracture: Secondary | ICD-10-CM | POA: Diagnosis not present

## 2017-09-27 DIAGNOSIS — S52502A Unspecified fracture of the lower end of left radius, initial encounter for closed fracture: Secondary | ICD-10-CM | POA: Diagnosis not present

## 2017-09-30 DIAGNOSIS — S52572A Other intraarticular fracture of lower end of left radius, initial encounter for closed fracture: Secondary | ICD-10-CM | POA: Diagnosis not present

## 2017-10-04 DIAGNOSIS — S52572A Other intraarticular fracture of lower end of left radius, initial encounter for closed fracture: Secondary | ICD-10-CM | POA: Diagnosis not present

## 2017-10-17 ENCOUNTER — Other Ambulatory Visit: Payer: Medicare Other

## 2017-10-17 DIAGNOSIS — S52572A Other intraarticular fracture of lower end of left radius, initial encounter for closed fracture: Secondary | ICD-10-CM | POA: Diagnosis not present

## 2017-10-24 ENCOUNTER — Ambulatory Visit: Payer: Medicare Other | Admitting: Internal Medicine

## 2017-10-30 DIAGNOSIS — S52572D Other intraarticular fracture of lower end of left radius, subsequent encounter for closed fracture with routine healing: Secondary | ICD-10-CM | POA: Diagnosis not present

## 2017-11-04 DIAGNOSIS — E785 Hyperlipidemia, unspecified: Secondary | ICD-10-CM | POA: Diagnosis not present

## 2017-11-04 DIAGNOSIS — I4891 Unspecified atrial fibrillation: Secondary | ICD-10-CM | POA: Diagnosis not present

## 2017-11-04 DIAGNOSIS — I10 Essential (primary) hypertension: Secondary | ICD-10-CM | POA: Diagnosis not present

## 2017-11-04 DIAGNOSIS — G4733 Obstructive sleep apnea (adult) (pediatric): Secondary | ICD-10-CM | POA: Diagnosis not present

## 2017-11-04 DIAGNOSIS — G4700 Insomnia, unspecified: Secondary | ICD-10-CM | POA: Diagnosis not present

## 2017-11-04 DIAGNOSIS — E039 Hypothyroidism, unspecified: Secondary | ICD-10-CM | POA: Diagnosis not present

## 2017-11-04 DIAGNOSIS — I5032 Chronic diastolic (congestive) heart failure: Secondary | ICD-10-CM | POA: Diagnosis not present

## 2017-11-04 DIAGNOSIS — D508 Other iron deficiency anemias: Secondary | ICD-10-CM | POA: Diagnosis not present

## 2017-11-04 DIAGNOSIS — I712 Thoracic aortic aneurysm, without rupture: Secondary | ICD-10-CM | POA: Diagnosis not present

## 2017-11-04 DIAGNOSIS — F330 Major depressive disorder, recurrent, mild: Secondary | ICD-10-CM | POA: Diagnosis not present

## 2017-11-04 DIAGNOSIS — Z0000 Encounter for general adult medical examination without abnormal findings: Secondary | ICD-10-CM | POA: Diagnosis not present

## 2017-11-06 DIAGNOSIS — D6832 Hemorrhagic disorder due to extrinsic circulating anticoagulants: Secondary | ICD-10-CM | POA: Diagnosis not present

## 2017-11-12 DIAGNOSIS — S52572D Other intraarticular fracture of lower end of left radius, subsequent encounter for closed fracture with routine healing: Secondary | ICD-10-CM | POA: Diagnosis not present

## 2017-11-25 ENCOUNTER — Other Ambulatory Visit: Payer: Self-pay | Admitting: Cardiology

## 2017-11-29 IMAGING — DX DG CHEST 2V
2 series · 2 of 2 positions shown · non-contrast
Comparison: 10/26/2014

CLINICAL DATA: Cough and congestion for 1 month, hypertension,
cough variant asthma, atrial fibrillation, GERD

EXAM:
CHEST  2 VIEW

[chest pa]
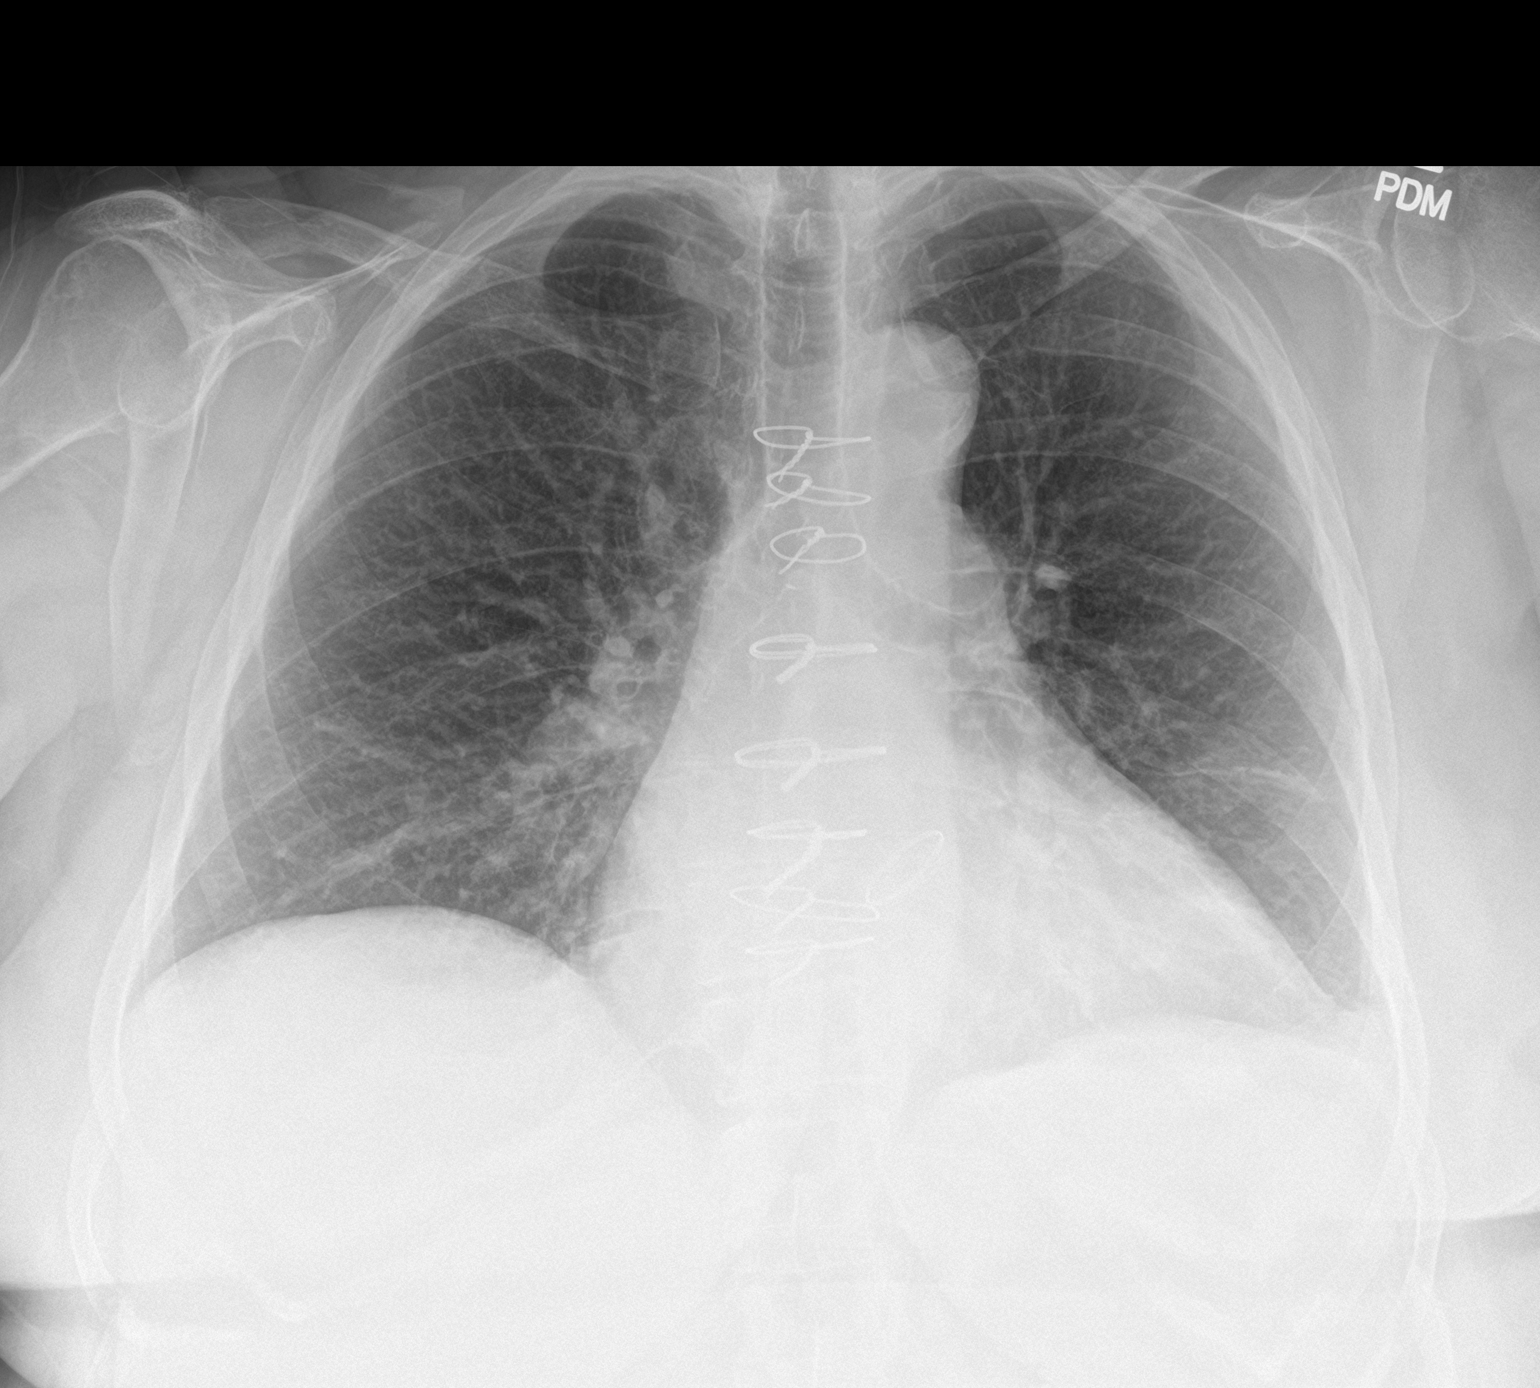

[chest lat]
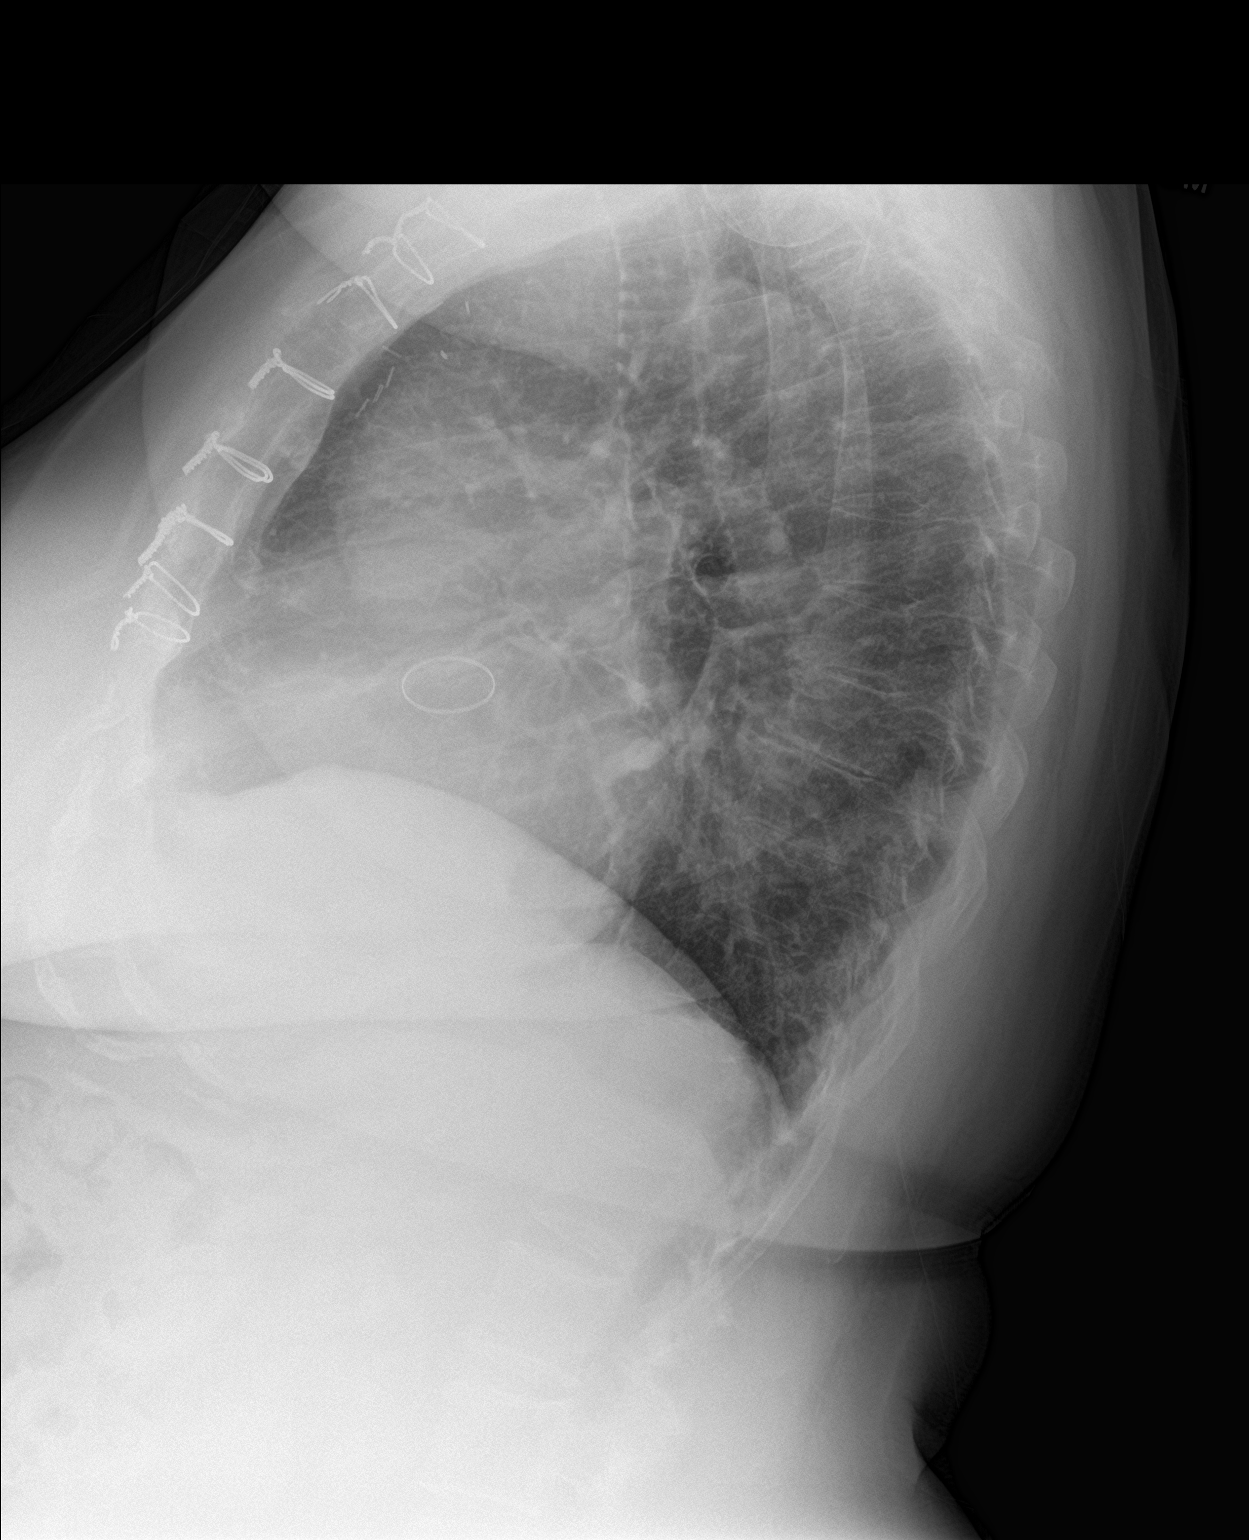

[2 of 2 positions shown; findings below may reference images not displayed]

FINDINGS: Enlargement of cardiac silhouette post median sternotomy and AVR.

Atherosclerotic calcification aorta.

Mediastinal contours and pulmonary vascularity normal.

Bronchitic changes with minimal bibasilar atelectasis.

Lungs otherwise clear.

No pleural effusion or pneumothorax.

Bones demineralized.
IMPRESSION: Enlargement of cardiac silhouette post AVR.

Bronchitic changes with minimal bibasilar atelectasis.

Aortic atherosclerosis.

## 2017-12-11 ENCOUNTER — Other Ambulatory Visit: Payer: Self-pay | Admitting: Cardiology

## 2017-12-16 NOTE — Telephone Encounter (Signed)
This encounter was created in error - please disregard.

## 2017-12-17 ENCOUNTER — Other Ambulatory Visit: Payer: Self-pay | Admitting: Cardiology

## 2017-12-17 ENCOUNTER — Ambulatory Visit (INDEPENDENT_AMBULATORY_CARE_PROVIDER_SITE_OTHER): Payer: Medicare Other | Admitting: *Deleted

## 2017-12-17 DIAGNOSIS — I481 Persistent atrial fibrillation: Secondary | ICD-10-CM

## 2017-12-17 DIAGNOSIS — I4819 Other persistent atrial fibrillation: Secondary | ICD-10-CM

## 2017-12-17 DIAGNOSIS — Z5181 Encounter for therapeutic drug level monitoring: Secondary | ICD-10-CM

## 2017-12-17 LAB — POCT INR: INR: 3 (ref 2.0–3.0)

## 2017-12-17 NOTE — Patient Instructions (Signed)
Description   Skip today's dose, ,then continue taking 1 tablet daily except 1/2 tablet on Tuesdays and Saturdays.  Recheck INR in 2 weeks. Keep intake of greens consistent.  Phone number [phone] Fax [phone]

## 2017-12-18 ENCOUNTER — Telehealth: Payer: Self-pay

## 2017-12-18 ENCOUNTER — Other Ambulatory Visit: Payer: Self-pay | Admitting: Internal Medicine

## 2017-12-18 ENCOUNTER — Telehealth: Payer: Self-pay | Admitting: Cardiology

## 2017-12-18 MED ORDER — LOSARTAN POTASSIUM 100 MG PO TABS: 100.0000 mg | ORAL_TABLET | Freq: Every day | ORAL | 3 refills | Status: DC

## 2017-12-18 NOTE — Telephone Encounter (Signed)
Spoke to patient and refilled her Losartan 100 mg daily.  She was thankful for the call.

## 2017-12-18 NOTE — Telephone Encounter (Signed)
Yes please refill and place back on med list - on discharge summary from 07/2017 she was still on it

## 2017-12-18 NOTE — Telephone Encounter (Signed)
Pt calling stating that she needed a refill on losartan 50 mg tablet taken 1 tablet daily. This medication was D/C and no longer on pt's medication list. I do not see where this medication was D/C by a provider. Pt stated that she is still taking this medication. Would Dr. Radford Pax like pt to continue this medication, if so could you please put it back on pt's medication list. Please address

## 2017-12-31 ENCOUNTER — Ambulatory Visit (INDEPENDENT_AMBULATORY_CARE_PROVIDER_SITE_OTHER): Payer: Medicare Other | Admitting: *Deleted

## 2017-12-31 DIAGNOSIS — I481 Persistent atrial fibrillation: Secondary | ICD-10-CM | POA: Diagnosis not present

## 2017-12-31 DIAGNOSIS — Z5181 Encounter for therapeutic drug level monitoring: Secondary | ICD-10-CM

## 2017-12-31 DIAGNOSIS — I4819 Other persistent atrial fibrillation: Secondary | ICD-10-CM

## 2017-12-31 LAB — POCT INR: INR: 3 (ref 2.0–3.0)

## 2017-12-31 NOTE — Patient Instructions (Signed)
Description   Skip today's dose, ,then change your dose to  1 tablet daily except 1/2 tablet on Tuesdays, Thursdays and Saturdays.  Recheck INR in 2 weeks. Keep intake of greens consistent.  Phone number [phone] Fax [phone]

## 2018-01-14 ENCOUNTER — Ambulatory Visit (INDEPENDENT_AMBULATORY_CARE_PROVIDER_SITE_OTHER): Payer: Medicare Other | Admitting: Pharmacist

## 2018-01-14 DIAGNOSIS — Z5181 Encounter for therapeutic drug level monitoring: Secondary | ICD-10-CM

## 2018-01-14 DIAGNOSIS — I4819 Other persistent atrial fibrillation: Secondary | ICD-10-CM | POA: Diagnosis not present

## 2018-01-14 LAB — POCT INR: INR: 1.4 — AB (ref 2.0–3.0)

## 2018-01-14 NOTE — Patient Instructions (Signed)
Description   Take an extra 1/2 tablet for 2 days, then start taking 1 tablet daily except 1/2 tablet on Tuesdays and Saturdays.  Recheck INR in 10 days. Keep intake of greens consistent.  Phone number [phone] Fax [phone]

## 2018-01-22 ENCOUNTER — Ambulatory Visit (HOSPITAL_COMMUNITY)
Admission: RE | Admit: 2018-01-22 | Discharge: 2018-01-22 | Disposition: A | Discharge status: Home / Self Care | Payer: Medicare Other | Source: Ambulatory Visit | Attending: Internal Medicine | Admitting: Internal Medicine

## 2018-01-22 VITALS — BP 165/74 | HR 69 | Wt 234.0 lb

## 2018-01-22 DIAGNOSIS — G4733 Obstructive sleep apnea (adult) (pediatric): Secondary | ICD-10-CM | POA: Insufficient documentation

## 2018-01-22 DIAGNOSIS — I10 Essential (primary) hypertension: Secondary | ICD-10-CM

## 2018-01-22 DIAGNOSIS — Z79890 Hormone replacement therapy (postmenopausal): Secondary | ICD-10-CM | POA: Diagnosis not present

## 2018-01-22 DIAGNOSIS — F419 Anxiety disorder, unspecified: Secondary | ICD-10-CM | POA: Insufficient documentation

## 2018-01-22 DIAGNOSIS — Z885 Allergy status to narcotic agent status: Secondary | ICD-10-CM | POA: Insufficient documentation

## 2018-01-22 DIAGNOSIS — K219 Gastro-esophageal reflux disease without esophagitis: Secondary | ICD-10-CM | POA: Insufficient documentation

## 2018-01-22 DIAGNOSIS — I5032 Chronic diastolic (congestive) heart failure: Secondary | ICD-10-CM

## 2018-01-22 DIAGNOSIS — E785 Hyperlipidemia, unspecified: Secondary | ICD-10-CM | POA: Diagnosis not present

## 2018-01-22 DIAGNOSIS — Z7901 Long term (current) use of anticoagulants: Secondary | ICD-10-CM | POA: Diagnosis not present

## 2018-01-22 DIAGNOSIS — F329 Major depressive disorder, single episode, unspecified: Secondary | ICD-10-CM | POA: Diagnosis not present

## 2018-01-22 DIAGNOSIS — Z8249 Family history of ischemic heart disease and other diseases of the circulatory system: Secondary | ICD-10-CM | POA: Diagnosis not present

## 2018-01-22 DIAGNOSIS — Z952 Presence of prosthetic heart valve: Secondary | ICD-10-CM

## 2018-01-22 DIAGNOSIS — Z79899 Other long term (current) drug therapy: Secondary | ICD-10-CM | POA: Diagnosis not present

## 2018-01-22 DIAGNOSIS — I480 Paroxysmal atrial fibrillation: Secondary | ICD-10-CM | POA: Insufficient documentation

## 2018-01-22 DIAGNOSIS — I4819 Other persistent atrial fibrillation: Secondary | ICD-10-CM | POA: Diagnosis not present

## 2018-01-22 DIAGNOSIS — Z9889 Other specified postprocedural states: Secondary | ICD-10-CM | POA: Insufficient documentation

## 2018-01-22 DIAGNOSIS — R0609 Other forms of dyspnea: Secondary | ICD-10-CM | POA: Diagnosis not present

## 2018-01-22 DIAGNOSIS — I350 Nonrheumatic aortic (valve) stenosis: Secondary | ICD-10-CM | POA: Diagnosis not present

## 2018-01-22 DIAGNOSIS — Z7951 Long term (current) use of inhaled steroids: Secondary | ICD-10-CM | POA: Insufficient documentation

## 2018-01-22 NOTE — Progress Notes (Signed)
ADVANCED HF CLINIC CONSULT NOTE  Referring Physician: [facility]: Primary Cardiologist: Radford Pax   HPI:  [name] is a 72 y.o. female with a h/o morbid obesity, mechanical AVR in 2006 (Dr. Prescott Gum) paroxsymal afib s/p failed Tikosyn load and recurrent DCCV which failed to maintain NSR. She was loaded on amio and underwent recurrent DCCV to NSR but this did not hold and she reverted back to afib. She underwent afib ablation at [facility] was onTikosyn. She had recurrent afib and underwent repeat afib ablation 10/2015 at [facility] with Dr. Clyda Hurdle and is now on Amio. She has been maintaining NSR since then. She also has a history of OSA on CPAP at 12cm H2O. She is referred for a second opinion regarding her AF and heart condition.    She was hospitalized 07/11/2017 with a rectus sheath hematoma felt secondary to straining from constipation.  General surgery did not recommend any surgical intervention and warfarin was held.  Her hospital stay was complicated by herpes zoster in the T11 dermatome.  She was started on Valtrex.  She is now back on warfarin anticoagulation.  She currently is in NSR on amiodarone 200 mg daily and doing well. Has seen opthalmologist every 6 months and has trace deposition. No other long-term complications. Has been very complaint with CPAP Denies h/o HF. Does have fatigue and exertional SOB. Not very active. Walks her dog a bit. Can go about 1 mile at a time at decent pace. Takes lasix 80 daily which controls edema fairly well. Weight has been stable around 220 at home.    Myoview 7/17 No ischemia Echo 5/19 - Left ventricle: The cavity size was mildly dilated. There was   moderate concentric hypertrophy. Systolic function was normal.   The estimated ejection fraction was in the range of 60% to 65%.   Wall motion was normal; there were no regional wall motion   abnormalities. Doppler parameters are consistent with high   ventricular filling  pressure. - Aortic valve: A mechanical prosthesis was present and functioning   normally. Mean gradient (S): 14 mm Hg. - Aorta: Ascending aorta diameter: 44 mm (ED). - Ascending aorta: The ascending aorta was moderately dilated. - Mitral valve: Calcified annulus. Mild diffuse calcification of   the anterior leaflet and posterior leaflet. There was mild to   moderate regurgitation. - Left atrium: The atrium was severely dilated. - Right atrium: The atrium was mildly dilated. - Pulmonary arteries: PA peak pressure: 42 mm Hg (S).   Review of Systems: [y] = yes, [ ]  = no   General: Weight gain [ ] ; Weight loss [ ] ; Anorexia [ ] ; Fatigue Blue.Reese ]; Fever [ ] ; Chills [ ] ; Weakness [ ]   Cardiac: Chest pain/pressure [ ] ; Resting SOB [ ] ; Exertional SOB Blue.Reese ]; Orthopnea [ ] ; Pedal Edema Blue.Reese ]; Palpitations [ ] ; Syncope [ ] ; Presyncope [ ] ; Paroxysmal nocturnal dyspnea[ ]   Pulmonary: Cough [ ] ; Wheezing[ ] ; Hemoptysis[ ] ; Sputum [ ] ; Snoring [ ]   GI: Vomiting[ ] ; Dysphagia[ ] ; Melena[ ] ; Hematochezia [ ] ; Heartburn[ ] ; Abdominal pain [ ] ; Constipation [ ] ; Diarrhea [ ] ; BRBPR [ ]   GU: Hematuria[ ] ; Dysuria [ ] ; Nocturia[ ]   Vascular: Pain in legs with walking [ ] ; Pain in feet with lying flat [ ] ; Non-healing sores [ ] ; Stroke [ ] ; TIA [ ] ; Slurred speech [ ] ;  Neuro: Headaches[ ] ; Vertigo[ ] ; Seizures[ ] ; Paresthesias[ ] ;Blurred vision [ ] ; Diplopia [ ] ; Vision changes [ ]   Ortho/Skin: Arthritis Blue.Reese ]; Joint pain Blue.Reese ]; Muscle pain [ ] ; Joint swelling [ ] ; Back Pain [ ] ; Rash [ ]   Psych: Depression[y ]; Anxiety[ ]   Heme: Bleeding problems Blue.Reese ]; Clotting disorders [ ] ; Anemia [ ]   Endocrine: Diabetes [ ] ; Thyroid dysfunction[ ]    Past Medical History:  Diagnosis Date  . Anxiety   . Aortic stenosis    status post aortic valve replacement with [employer] mechanical prosthesis  . 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   9mm by echo 06/2016 and 28mm by CT angin 2016  . Chronic anticoagulation   .  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 Depression   . DJD (degenerative joint disease) of knee   . Dyslipidemia   . GERD (gastroesophageal reflux disease)    "several years ago; none since" (11/12/2012)  . Hyperlipidemia   . Hyperlipidemia LDL goal <70 01/19/2017  . Hypertension   . Left atrial enlargement   . Mitral regurgitation 06/25/2016   Mild moderate MR by echo 06/2016  . Obesity   . Persistent atrial fibrillation (HCC)    s/p afib ablation x 2 at [facility]  . Sleep apnea    On CPAP at 12cm H2O  . 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 - now cleared by neurosurgery to maintain INR 2-2.5    Current Outpatient Medications  Medication Sig Dispense Refill  . ALPRAZolam (XANAX) 0.5 MG tablet Take 0.5 mg by mouth 2 (two) times daily as needed. AS NEEDED FOR ANXIETY  1  . amiodarone (PACERONE) 200 MG tablet TAKE 1 TABLET BY MOUTH DAILY 30 tablet 11  . Ascorbic Acid (VITAMIN C) 1000 MG tablet Take 2,000 mg by mouth daily.    . benzonatate (TESSALON) 100 MG capsule Take 1 capsule (100 mg total) by mouth 3 (three) times daily as needed for cough. 20 capsule 0  . bisacodyl (DULCOLAX) 10 MG suppository Place 1 suppository (10 mg total) rectally as needed for moderate constipation. 12 suppository 0  . calcium-vitamin D (OSCAL WITH D) 500-200 MG-UNIT per tablet Take 2 tablets by mouth daily.     . Cholecalciferol (VITAMIN D) 2000 UNITS tablet Take 4,000 Units by mouth daily.    Marland Kitchen DILT-XR 240 MG 24 hr capsule TAKE 1 CAPSULE BY MOUTH EVERY MORNING ON AN EMPTY STOMACH 90 capsule 3  . DULoxetine (CYMBALTA) 60 MG capsule Take 60 mg by mouth daily.    . furosemide (LASIX) 80 MG tablet Take 1 tablet (80 mg total) by mouth daily. 90 tablet 2  . levothyroxine (SYNTHROID, LEVOTHROID) 100 MCG tablet Take 100 mcg daily before breakfast by mouth.    . losartan (COZAAR) 100 MG tablet Take 1 tablet (100 mg total) by mouth daily. 90 tablet  3  . Multiple Vitamin (MULTIVITAMIN WITH MINERALS) TABS Take 1 tablet by mouth daily.    . Multiple Vitamins-Iron (MULTIVITAMINS WITH IRON) TABS tablet Take 1 tablet by mouth daily.    . polyethylene glycol (MIRALAX / GLYCOLAX) packet Take three times daily till you have regular bowel movements 90 each 0  . potassium chloride SA (K-DUR,KLOR-CON) 20 MEQ tablet TAKE 2 TABLETS BY MOUTH EVERY MORNING AND 1 TABLET EVERY EVENING 270 tablet 2  . pravastatin (PRAVACHOL) 20 MG tablet Take 1 tablet (20 mg total) by mouth every evening. 30 tablet 11  . SYMBICORT 160-4.5 MCG/ACT inhaler INHALE 2 PUFFS BY MOUTH INTO THE  LUNGS TWICE DAILY 10.2 g 0  . tretinoin (RETIN-A) 0.05 % cream Apply 1 application topically at bedtime.     Marland Kitchen warfarin (COUMADIN) 4 MG tablet TAKE 1/2 TO 1 TABLET BY MOUTH DAILY AS DIRECTED BY COUMADIN CLINIC 30 tablet 1  . zolpidem (AMBIEN) 10 MG tablet Take 5 mg by mouth at bedtime as needed for sleep.     No current facility-administered medications for this encounter.     Allergies  Allergen Reactions  . Codeine Anaphylaxis    Jerking, involuntary jerking   . Beta Adrenergic Blockers     Makes her crazy Makes her crazy  Makes her crazy  . Metoprolol     depression depression  . Propoxyphene Other (See Comments)    Bad vivid dreams Bad vivid dreams  . Toprol Xl [Metoprolol Tartrate]     depression  . Dilantin [Phenytoin Sodium Extended] Rash and Itching      Social History   Socioeconomic History  . Marital status: Married    Spouse name: Not on file  . Number of children: Not on file  . Years of education: Not on file  . Highest education level: Not on file  Occupational History  . Not on file  Social Needs  . Financial resource strain: Not on file  . Food insecurity:    Worry: Not on file    Inability: Not on file  . Transportation needs:    Medical: Not on file    Non-medical: Not on file  Tobacco Use  . Smoking status: Never Smoker  . Smokeless  tobacco: Never Used  Substance and Sexual Activity  . Alcohol use: No    Comment: 11/12/2012 "no alcohol in the last 9 years"  . Drug use: No  . Sexual activity: Not Currently  Lifestyle  . Physical activity:    Days per week: Not on file    Minutes per session: Not on file  . Stress: Not on file  Relationships  . Social connections:    Talks on phone: Not on file    Gets together: Not on file    Attends religious service: Not on file    Active member of club or organization: Not on file    Attends meetings of clubs or organizations: Not on file    Relationship status: Not on file  . Intimate partner violence:    Fear of current or ex partner: Not on file    Emotionally abused: Not on file    Physically abused: Not on file    Forced sexual activity: Not on file  Other Topics Concern  . Not on file  Social History Narrative   Pt lives in [place] with spouse.  Retired      Family History  Problem Relation Age of Onset  . Lung cancer Mother   . Hypertension Mother   . Hyperlipidemia Father   . Hypertension Father   . Heart disease Brother     Vitals:   01/22/18 1111  BP: (!) 165/74  Pulse: 69  SpO2: 97%  Weight: 106.1 kg (234 lb)    PHYSICAL EXAM: General:  Well appearing. No respiratory difficulty HEENT: normal Neck: supple. no JVD. Carotids 2+ bilat; no bruits. No lymphadenopathy or thryomegaly appreciated. Cor: PMI nondisplaced. Regular rate & rhythm. [employer] S2 soft SEM over valve Lungs: clear Abdomen: obese soft, nontender, nondistended. No hepatosplenomegaly. No bruits or masses. Good bowel sounds. Extremities: no cyanosis, clubbing, rash, edema Neuro: alert & oriented x 3,  cranial nerves grossly intact. moves all 4 extremities w/o difficulty. Affect pleasant.  ECG: NSR 96 with 1AVB 24ms. Narrow qrs 96 ms. Non specific ST-T wave abnormalities. Personally reviewed    ASSESSMENT & PLAN:  1. Dyspnea on exertion - This is mild. Volume status looks  good.  - EF normal on echo with mild to moderate PAH - Encouraged her to exercise more. Will refer to [employer] exercise program  2. PAF - s/p ablation x 2. Maintaining NSR on amio - Continue coumadin for AVR  3. S/p Mechanical AVR - stable on echo. Continue coumadin  4. OSA - continue CPAP  5. HTN - BP elevated here but well controlled at all other visits on flowsheet. She did not take meds this am. Continue to follow.   No current HF needs. She would like to switch cardiology providers. We will submit the request to Dr. Harrell Gave.   Glori Bickers, MD  2:48 PM

## 2018-01-22 NOTE — Patient Instructions (Signed)
You have been referred to the PREP program at the [facility], they will contact you to schedule  You have been referred to Dr Buford Dresser, you need to be seen in 4 months, they will call you to schedule

## 2018-01-23 ENCOUNTER — Other Ambulatory Visit: Payer: Self-pay | Admitting: Cardiology

## 2018-01-24 ENCOUNTER — Ambulatory Visit (INDEPENDENT_AMBULATORY_CARE_PROVIDER_SITE_OTHER): Payer: Medicare Other | Admitting: *Deleted

## 2018-01-24 DIAGNOSIS — Z5181 Encounter for therapeutic drug level monitoring: Secondary | ICD-10-CM

## 2018-01-24 DIAGNOSIS — I4819 Other persistent atrial fibrillation: Secondary | ICD-10-CM

## 2018-01-24 LAB — POCT INR: INR: 1.9 — AB (ref 2.0–3.0)

## 2018-01-24 NOTE — Patient Instructions (Signed)
Description   Start taking 1 tablet daily except 1/2 tablet on Tuesdays.  Recheck INR in 2 weeks.  Keep intake of greens consistent.  Phone number [phone] Fax [phone]

## 2018-01-27 ENCOUNTER — Telehealth: Payer: Self-pay

## 2018-01-27 NOTE — Telephone Encounter (Signed)
Left a VM for [name] to return my call regarding her referral for the PREP at the [facility].

## 2018-01-31 DIAGNOSIS — Z23 Encounter for immunization: Secondary | ICD-10-CM | POA: Diagnosis not present

## 2018-02-17 ENCOUNTER — Other Ambulatory Visit: Payer: Self-pay | Admitting: Cardiology

## 2018-02-17 ENCOUNTER — Other Ambulatory Visit: Payer: Self-pay | Admitting: Internal Medicine

## 2018-02-18 ENCOUNTER — Ambulatory Visit: Payer: Medicare Other | Admitting: Cardiology

## 2018-03-17 ENCOUNTER — Telehealth: Payer: Self-pay | Admitting: Cardiology

## 2018-03-17 ENCOUNTER — Telehealth: Payer: Self-pay

## 2018-03-17 ENCOUNTER — Other Ambulatory Visit: Payer: Self-pay | Admitting: Internal Medicine

## 2018-03-17 NOTE — Telephone Encounter (Signed)
New Message   Patient is requesting to switch from Dr. Radford Pax to Dr. Harrell Gave.

## 2018-03-17 NOTE — Telephone Encounter (Signed)
I am fine with that 

## 2018-03-17 NOTE — Telephone Encounter (Signed)
OK by me 

## 2018-03-17 NOTE — Telephone Encounter (Signed)
Spoke to pt's sister, Retta Diones who is on her list of contacts about the 12-week PREP at the [facility] to begin on 04/15/18.  We will meet on 03/25/18 at 1pm for intake appointment.

## 2018-04-03 DIAGNOSIS — L03115 Cellulitis of right lower limb: Secondary | ICD-10-CM | POA: Diagnosis not present

## 2018-04-03 DIAGNOSIS — T148XXA Other injury of unspecified body region, initial encounter: Secondary | ICD-10-CM | POA: Diagnosis not present

## 2018-04-10 ENCOUNTER — Other Ambulatory Visit: Payer: Self-pay | Admitting: Cardiology

## 2018-04-12 DIAGNOSIS — L03115 Cellulitis of right lower limb: Secondary | ICD-10-CM | POA: Diagnosis not present

## 2018-04-12 DIAGNOSIS — T148XXD Other injury of unspecified body region, subsequent encounter: Secondary | ICD-10-CM | POA: Diagnosis not present

## 2018-04-13 ENCOUNTER — Emergency Department (HOSPITAL_COMMUNITY)
Admission: EM | Admit: 2018-04-13 | Discharge: 2018-04-13 | Disposition: A | Discharge status: Home / Self Care | Payer: Medicare Other | Attending: Emergency Medicine | Admitting: Emergency Medicine

## 2018-04-13 ENCOUNTER — Other Ambulatory Visit: Payer: Self-pay

## 2018-04-13 ENCOUNTER — Encounter (HOSPITAL_COMMUNITY): Payer: Self-pay | Admitting: Emergency Medicine

## 2018-04-13 DIAGNOSIS — Z8679 Personal history of other diseases of the circulatory system: Secondary | ICD-10-CM | POA: Insufficient documentation

## 2018-04-13 DIAGNOSIS — I110 Hypertensive heart disease with heart failure: Secondary | ICD-10-CM | POA: Diagnosis not present

## 2018-04-13 DIAGNOSIS — R2241 Localized swelling, mass and lump, right lower limb: Secondary | ICD-10-CM | POA: Diagnosis present

## 2018-04-13 DIAGNOSIS — Z79899 Other long term (current) drug therapy: Secondary | ICD-10-CM | POA: Insufficient documentation

## 2018-04-13 DIAGNOSIS — I5032 Chronic diastolic (congestive) heart failure: Secondary | ICD-10-CM | POA: Diagnosis not present

## 2018-04-13 DIAGNOSIS — W228XXA Striking against or struck by other objects, initial encounter: Secondary | ICD-10-CM | POA: Insufficient documentation

## 2018-04-13 DIAGNOSIS — L02415 Cutaneous abscess of right lower limb: Secondary | ICD-10-CM | POA: Diagnosis not present

## 2018-04-13 DIAGNOSIS — S81811D Laceration without foreign body, right lower leg, subsequent encounter: Secondary | ICD-10-CM | POA: Diagnosis not present

## 2018-04-13 DIAGNOSIS — Z7901 Long term (current) use of anticoagulants: Secondary | ICD-10-CM | POA: Insufficient documentation

## 2018-04-13 MED ORDER — LIDOCAINE-EPINEPHRINE (PF) 2 %-1:200000 IJ SOLN
10.0000 mL | Freq: Once | INTRAMUSCULAR | Status: AC
  Administered 2018-04-13: 10 mL
  Filled 2018-04-13: qty 20

## 2018-04-13 NOTE — ED Notes (Signed)
Bed: SW97 Expected date:  Expected time:  Means of arrival:  Comments: HOLD

## 2018-04-13 NOTE — ED Triage Notes (Signed)
Pt with fall in December and injured RLE, pt had developed cellulitis and is currently on 2nd antibiotic and continues on Bactrim. Pt with abscess to RLE below knee and was told to come to ED for drainage.

## 2018-04-13 NOTE — ED Provider Notes (Signed)
[facility] DEPT Provider Note   CSN: [id number] Arrival date & time: 04/13/18  0911   History   Chief Complaint Chief Complaint  Patient presents with  . Abscess    HPI [name] is a 73 y.o. 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 Patient reports hitting the anterior aspect of her right lower extremity on 03/25/18 while getting into a van. Patient reports area became erythematous, edematous, and tender so she was evaluated by PCP and diagnosed with cellulitis. PCP started Doxycyline and ordered a culture. Patient reports symptoms improved while on doxycyline, but she was called 3 days ago and instructed to change antibiotic to Bactrim based on the culture. Patient reports erythema, edema, and tenderness has improved while on antibiotics. Patient states she was evaluated by an urgent care yesterday and instructed to come to the ER to have the abscess drained. Patient denies fever, chills, abdominal pain, nausea, vomiting, shortness of breath, and states she is able to ambulate without difficulty.   HPI  Past Medical History:  Diagnosis Date  . Anxiety   . Aortic stenosis    status post aortic valve replacement with St. Jude mechanical prosthesis  . 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   35mm by echo 06/2016 and 83mm by CT angin 2016  . Chronic anticoagulation   . 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 Depression   . DJD (degenerative joint disease) of knee   . Dyslipidemia   . GERD (gastroesophageal reflux disease)    "several years ago; none since" (11/12/2012)  . Hyperlipidemia   . Hyperlipidemia LDL goal <70 01/19/2017  . Hypertension   . Left atrial enlargement   . Mitral regurgitation 06/25/2016   Mild moderate MR by echo 06/2016  . Obesity   . Persistent atrial fibrillation    s/p afib  abl[facility] x 2 at [facility]  . Sleep apnea    On CPAP at 12cm H2O  . 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 Hematoma 07/11/2017  . Rectus sheath hematoma, initial encounter   . Hyperlipidemia LDL goal <70 01/19/2017  . Morbid obesity due to excess calories ([place]) complicated by low ERV on pfts/ hbp/hyperlipidemia 09/02/2016  . Mitral regurgitation 06/25/2016  . Ascending aorta dilatation ([place]) 06/25/2016  . Dyspnea on exertion 04/04/2016  . Sinusitis, chronic 04/04/2016  . Cough variant asthma vs UACS 04/03/2016  . Primary osteoarthritis of left knee   . DJD (degenerative joint disease) of knee 11/05/2014  . Encounter for therapeutic drug monitoring 05/07/2013  . Obstructive sleep apnea 01/20/2013  . Aortic stenosis 01/15/2013  . S/P AVR (aortic valve replacement) 01/15/2013  . Chronic diastolic CHF (congestive heart failure) ([place]) 11/14/2012  . Persistent atrial fibrillation 07/17/2012  . Essential hypertension 07/17/2012    Past Surgical History:  Procedure Laterality Date  . BURR HOLE FOR SUBDURAL HEMATOMA  2006  . CARDIAC VALVE REPLACEMENT  2006   St. Jude AVR  . CARDIOVERSION N/A 07/22/2012   Procedure: CARDIOVERSION;  Surgeon: [name], MD;  Location: [facility] ENDOSCOPY;  Service: Cardiovascular;  Laterality: N/A;  . CARDIOVERSION N/A 11/25/2012   Procedure: CARDIOVERSION;  Surgeon: [name], MD;  Location:  [facility] ENDOSCOPY;  Service: Cardiovascular;  Laterality: N/A;  . CARDIOVERSION N/A 12/30/2012   Procedure: CARDIOVERSION;  Surgeon: [name], MD;  Location: [facility] ENDOSCOPY;  Service: Cardiovascular;  Laterality: N/A;  h&p in file-HW   . CARDIOVERSION N/A 03/30/2013   Procedure: CARDIOVERSION;  Surgeon: [name], MD;  Location: [facility] ENDOSCOPY;  Service: Cardiovascular;  Laterality: N/A;  . ELECTROPHYSIOLOGIC STUDY  11/2013     Afib ablation x 2 (11/2013 and 10/2015) at [facility] by Dr Clyda Hurdle with recurrence post ablation  . KNEE ARTHROSCOPY Left 1980's   "2" (11/12/2012)  . KNEE SURGERY Left 1980's   "after 2 scopes they went in and did some kind of OR" (11/12/2012)  . TEE WITHOUT CARDIOVERSION N/A 07/22/2012   Procedure: TRANSESOPHAGEAL ECHOCARDIOGRAM (TEE);  Surgeon: [name], MD;  Location: [facility] ENDOSCOPY;  Service: Cardiovascular;  Laterality: N/A;  Rm 2034  . TEE WITHOUT CARDIOVERSION N/A 10/07/2013   Procedure: TRANSESOPHAGEAL ECHOCARDIOGRAM (TEE);  Surgeon: [name], MD;  Location: [facility] ENDOSCOPY;  Service: Cardiovascular;  Laterality: N/A;  . TOTAL KNEE ARTHROPLASTY Left 11/05/2014   Procedure: TOTAL KNEE ARTHROPLASTY;  Surgeon: Dorna Leitz, MD;  Location: [facility];  Service: Orthopedics;  Laterality: Left;  . TUBAL LIGATION  1980's     OB History   No obstetric history on file.      Home Medications    Prior to Admission medications   Medication Sig Start Date End Date Taking? Authorizing Provider  ALPRAZolam Duanne Moron) 0.5 MG tablet Take 0.5 mg by mouth 2 (two) times daily as needed. AS NEEDED FOR ANXIETY 01/11/17   [provider]  amiodarone (PACERONE) 200 MG tablet TAKE 1 TABLET BY MOUTH DAILY 04/08/17   [name], MD  Ascorbic Acid (VITAMIN C) 1000 MG tablet Take 2,000 mg by mouth daily.    [provider]  benzonatate (TESSALON) 100 MG capsule Take 1 capsule (100 mg total) by mouth 3 (three) times daily as needed for cough. 05/18/17   Shelda Pal, DO  bisacodyl (DULCOLAX) 10 MG suppository Place 1 suppository (10 mg total) rectally as needed for moderate constipation. 07/15/17   Bonnielee Haff, MD  calcium-vitamin D (OSCAL WITH D) 500-200 MG-UNIT per tablet Take 2 tablets by mouth daily.     [provider]  Cholecalciferol (VITAMIN D) 2000 UNITS tablet Take 4,000 Units by mouth daily.    [provider]  DILT-XR 240 MG 24 hr capsule TAKE 1 CAPSULE BY MOUTH  EVERY MORNING ON AN EMPTY STOMACH 02/17/18   [name], MD  DULoxetine (CYMBALTA) 60 MG capsule Take 60 mg by mouth daily.    [provider]  furosemide (LASIX) 80 MG tablet Take 1 tablet (80 mg total) by mouth daily. Please keep upcoming appt in February for future refills. Thank you 04/11/18   [name], MD  levothyroxine (SYNTHROID, LEVOTHROID) 100 MCG tablet Take 100 mcg daily before breakfast by mouth.    [provider]  losartan (COZAAR) 100 MG tablet Take 1 tablet (100 mg total) by mouth daily. 12/18/17   [name], MD  Multiple Vitamin (MULTIVITAMIN WITH MINERALS) TABS Take 1 tablet by mouth daily.    [provider]  Multiple Vitamins-Iron (MULTIVITAMINS WITH IRON) TABS tablet Take 1 tablet by mouth daily.    [provider]  polyethylene glycol (MIRALAX / GLYCOLAX) packet Take three times daily till you have regular bowel movements 07/15/17   Bonnielee Haff, MD  potassium chloride SA (K-DUR,KLOR-CON) 20  MEQ tablet TAKE 2 TABLETS BY MOUTH EVERY MORNING AND 1 TABLET EVERY EVENING 06/19/17   [name], MD  pravastatin (PRAVACHOL) 20 MG tablet Take 1 tablet (20 mg total) by mouth every evening. 08/13/17   [name], MD  SYMBICORT 160-4.5 MCG/ACT inhaler INHALE 2 PUFFS BY MOUTH INTO THE LUNGS TWICE DAILY 12/18/17   Tanda Rockers, MD  tretinoin (RETIN-A) 0.05 % cream Apply 1 application topically at bedtime.     [provider]  warfarin (COUMADIN) 4 MG tablet TAKE 1/2 TO 1 TABLET BY MOUTH DAILY AS DIRECTED BY COUMADIN CLINIC 01/24/18   [name], MD  zolpidem (AMBIEN) 10 MG tablet Take 5 mg by mouth at bedtime as needed for sleep.    [provider]    Family History Family History  Problem Relation Age of Onset  . Lung cancer Mother   . Hypertension Mother   . Hyperlipidemia Father   . Hypertension Father   . Heart disease Brother     Social History Social History   Tobacco Use  . Smoking status:  Never Smoker  . Smokeless tobacco: Never Used  Substance Use Topics  . Alcohol use: No    Comment: 11/12/2012 "no alcohol in the last 9 years"  . Drug use: No     Allergies   Codeine; Beta adrenergic blockers; Metoprolol; Propoxyphene; Toprol xl [metoprolol tartrate]; and Dilantin [phenytoin sodium extended]   Review of Systems Review of Systems  Constitutional: Negative for chills, diaphoresis and fever.  Respiratory: Negative for cough and shortness of breath.   Cardiovascular: Negative for chest pain.  Gastrointestinal: Negative for abdominal pain, nausea and vomiting.  Endocrine: Negative for cold intolerance and heat intolerance.  Genitourinary: Negative for dysuria.  Musculoskeletal: Negative for gait problem.  Skin: Positive for color change.       Pt reports an abscess.  Hematological: Negative for adenopathy.     Physical Exam Updated Vital Signs BP (!) 134/118 (BP Location: Left Arm)   Pulse 71   Temp 98.4 F (36.9 C) (Oral)   Resp 17   SpO2 96%   Physical Exam Vitals signs and nursing note reviewed.  Constitutional:      General: She is not in acute distress.    Appearance: She is well-developed. She is not diaphoretic.  HENT:     Head: Normocephalic and atraumatic.  Cardiovascular:     Rate and Rhythm: Normal rate and regular rhythm.     Heart sounds: Normal heart sounds. No murmur. No friction rub. No gallop.   Pulmonary:     Effort: Pulmonary effort is normal. No respiratory distress.     Breath sounds: Normal breath sounds. No wheezing or rales.  Abdominal:     Palpations: Abdomen is soft.     Tenderness: There is no abdominal tenderness.  Musculoskeletal: Normal range of motion.     Right knee: Normal. She exhibits normal range of motion, no swelling and no effusion.     Right ankle: Normal. She exhibits normal range of motion, no swelling and no ecchymosis.  Skin:    General: Skin is warm.     Findings: Erythema present. No rash.     Comments:  Abscess noted over the anterior aspect of RLE. Mildly tender to palpation. Mild warmth and area of fluctuance noted. Sensation intact. 2+ DP pulses.  Neurological:     Mental Status: She is alert.        ED Treatments / Results  Labs (all labs ordered are listed, but only abnormal results are displayed) Labs Reviewed - No data to display  EKG None  Radiology No results found.  Procedures .Marland KitchenIncision and Drainage Date/Time: 04/13/2018 3:22 PM Performed by: Arville Lime, PA-C Authorized by: Arville Lime, PA-C   Consent:    Consent obtained:  Verbal   Consent given by:  Patient   Risks discussed:  Bleeding, incomplete drainage, pain and damage to other organs   Alternatives discussed:  No treatment Universal protocol:    Procedure explained and questions answered to patient or proxy's satisfaction: yes     Relevant documents present and verified: yes     Test results available and properly labeled: yes     Imaging studies available: yes     Required blood products, implants, devices, and special equipment available: yes     Site/side marked: yes     Immediately prior to procedure a time out was called: yes     Patient identity confirmed:  Verbally with patient Location:    Type:  Abscess   Size:  4cm   Location:  Lower extremity   Lower extremity location:  Leg   Leg location:  R lower leg Pre-procedure details:    Skin preparation:  Betadine Anesthesia (see MAR for exact dosages):    Anesthesia method:  Local infiltration   Local anesthetic:  Lidocaine 1% WITH epi and lidocaine 2% WITH epi Procedure type:    Complexity:  Simple Procedure details:    Incision types:  Single straight   Incision depth:  Subcutaneous   Scalpel blade:  11   Wound management:  Probed and deloculated, irrigated with saline and extensive cleaning   Drainage:  Bloody   Drainage amount:  Moderate   Packing materials:  None Post-procedure details:    Patient tolerance of  procedure:  Tolerated well, no immediate complications   (including critical care time)  Medications Ordered in ED Medications  lidocaine-EPINEPHrine (XYLOCAINE W/EPI) 2 %-1:200000 (PF) injection 10 mL (10 mLs Infiltration Given 04/13/18 1446)     Initial Impression / Assessment and Plan / ED Course  I have reviewed the triage vital signs and the nursing notes.  Pertinent labs & imaging results that were available during my care of the patient were reviewed by me and considered in my medical decision making (see chart for details).    Patient with skin abscess amenable to incision and drainage.  Abscess was not large enough to warrant packing or drain,  wound recheck in 2 days. Encouraged home warm soaks and flushing.  Mild signs of cellulitis is surrounding skin.  Will advise patient to continue antibiotics as prescribed. Will d/c to home.    Final Clinical Impressions(s) / ED Diagnoses   Final diagnoses:  Abscess of right lower extremity    ED Discharge Orders    None       Arville Lime, PA-C 04/13/18 [place], DO 04/13/18 1552

## 2018-04-13 NOTE — Discharge Instructions (Addendum)
You have been seen today for a leg abscess. Please read and follow all provided instructions.   1. Medications: usual home medications 2. Treatment: rest, drink plenty of fluids, warm compress 3. Follow Up: Please follow up with your primary doctor in 2 days for discussion of your diagnoses and further evaluation after today's visit; if you do not have a primary care doctor use the resource guide provided to find one; Please return to the ER for any new or worsening symptoms. Please obtain all of your results from medical records or have your doctors office obtain the results - share them with your doctor - you should be seen at your doctors office. Call today to arrange your follow up.   Take medications as prescribed. Please review all of the medicines and only take them if you do not have an allergy to them. Return to the emergency room for worsening condition or new concerning symptoms. Follow up with your regular doctor. If you don't have a regular doctor use one of the numbers below to establish a primary care doctor.  Please be aware that if you are taking birth control pills, taking other prescriptions, ESPECIALLY ANTIBIOTICS may make the birth control ineffective - if this is the case, either do not engage in sexual activity or use alternative methods of birth control such as condoms until you have finished the medicine and your family doctor says it is OK to restart them. If you are on a blood thinner such as COUMADIN, be aware that any other medicine that you take may cause the coumadin to either work too much, or not enough - you should have your coumadin level rechecked in next 7 days if this is the case.  ?  It is also a possibility that you have an allergic reaction to any of the medicines that you have been prescribed - Everybody reacts differently to medications and while MOST people have no trouble with most medicines, you may have a reaction such as nausea, vomiting, rash, swelling,  shortness of breath. If this is the case, please stop taking the medicine immediately and contact your physician.  ?  You should return to the ER if you develop severe or worsening symptoms.   Emergency Department Resource Guide 1) Find a Doctor and Pay Out of Pocket Although you won't have to find out who is covered by your insurance plan, it is a good idea to ask around and get recommendations. You will then need to call the office and see if the doctor you have chosen will accept you as a new patient and what types of options they offer for patients who are self-pay. Some doctors offer discounts or will set up payment plans for their patients who do not have insurance, but you will need to ask so you aren't surprised when you get to your appointment.  2) Contact Your Local Health Department Not all health departments have doctors that can see patients for sick visits, but many do, so it is worth a call to see if yours does. If you don't know where your local health department is, you can check in your phone book. The CDC also has a tool to help you locate your state's health department, and many state websites also have listings of all of their local health departments.  3) Find a [facility] If your illness is not likely to be very severe or complicated, you may want to try a walk in clinic. These are popping  up all over the country in pharmacies, drugstores, and shopping centers. They're usually staffed by nurse practitioners or physician assistants that have been trained to treat common illnesses and complaints. They're usually fairly quick and inexpensive. However, if you have serious medical issues or chronic medical problems, these are probably not your best option.  No Primary Care Doctor: Call Health Connect at  [phone] - they can help you locate a primary care doctor that  accepts your insurance, provides certain services, etc. Physician Referral [phone]  Emergency  Department Resource Guide 1) Find a Doctor and Pay Out of Pocket Although you won't have to find out who is covered by your insurance plan, it is a good idea to ask around and get recommendations. You will then need to call the office and see if the doctor you have chosen will accept you as a new patient and what types of options they offer for patients who are self-pay. Some doctors offer discounts or will set up payment plans for their patients who do not have insurance, but you will need to ask so you aren't surprised when you get to your appointment.  2) Contact Your Local Health Department Not all health departments have doctors that can see patients for sick visits, but many do, so it is worth a call to see if yours does. If you don't know where your local health department is, you can check in your phone book. The CDC also has a tool to help you locate your state's health department, and many state websites also have listings of all of their local health departments.  3) Find a [facility] If your illness is not likely to be very severe or complicated, you may want to try a walk in clinic. These are popping up all over the country in pharmacies, drugstores, and shopping centers. They're usually staffed by nurse practitioners or physician assistants that have been trained to treat common illnesses and complaints. They're usually fairly quick and inexpensive. However, if you have serious medical issues or chronic medical problems, these are probably not your best option.  No Primary Care Doctor: Call Health Connect at  [phone] - they can help you locate a primary care doctor that  accepts your insurance, provides certain services, etc. Physician Referral Service- [phone]  Chronic Pain Problems: Organization         Address  Phone   Notes  [facility]  [phone] Patients need to be referred by their primary care doctor.   Medication  Assistance: Organization         Address  Phone   Notes  [facility] Medication [facility] [address] [place], [place] [address] [phone] --Must be a resident of [facility] -- Must have NO insurance coverage whatsoever (no Medicaid/ Medicare, etc.) -- The pt. MUST have a primary care doctor that directs their care regularly and follows them in the community   [facility]  [phone]   [employer]  [phone]    Agencies that provide inexpensive medical care: Organization         Address  Phone   Notes  [place]  [phone]   [facility]    [phone]   [facility] [place],  [address] [phone]   [place] [address], [place] [phone]   [facility]    [phone]  [facility]    [phone]   [facility]  [address], [place] Phone:  [phone], Fax:  [phone] Hours of Operation:  9 am - 6 pm, M-F.  Also accepts Medicaid/Medicare and self-pay.  [facility] for [address], [address], [place] Phone: [phone], Fax: [phone]. Hours of Operation:  8:30 am - 5:30 pm, M-F.  Also accepts Medicaid and self-pay.  [facility] [place] [address], [place] Phone: [phone]   [place], [place], [place] [phone], [address]. [phone] Mondays & Thursdays: 7-9 AM.  First 15 patients are seen on a first come, first serve basis.    [place] Providers:  Organization         Address  Phone   Notes  [facility] [address], [place] [phone] Also accepts self-pay patients.  [facility] [address], [place]  [phone]   [place], [address], [place]  [phone]   [facility] [address] [place] [phone]   Lucianne Lei [address], [place]   [phone] Only accepts [place] [facility] [place] patients after they have their name applied to their card.   Self-Pay (no insurance) in [facility]:  Organization         Address  Phone   Notes  Sickle Cell Patients, [facility] [phone]   [facility] [place] [phone]   [facility] [place]  [place], [place], [place] [phone]   [facility]/Dr. Osei-Bonsu  [address] [place] or [address], [place] [phone] Phone number for both [place] and [place] locations is the same.  [facility] and [facility] [address], [place] [phone]   [facility] [address], [place] or [address] [phone]   [facility] [address], [place] [phone], phone; [phone], fax Sees patients 1st and 3rd Saturday of every month.  Must not qualify for public or private insurance (i.e. Medicaid, Medicare, [place] Health Choice, Veterans' Benefits)  Household income should be no more than 200% of the poverty level The clinic cannot treat you if you are pregnant or think you are pregnant  Sexually transmitted diseases are not treated at the clinic.

## 2018-04-16 ENCOUNTER — Ambulatory Visit: Payer: Medicare Other | Admitting: Physician Assistant

## 2018-04-16 NOTE — Progress Notes (Signed)
[name]   Patient Details  Name: [name] MRN: [id number] Date of Birth: [birth year] Age: 73 y.o. PCP: Maurice Small, MD  Vitals:   04/15/18 1157  Pulse: 62  Resp: 18  SpO2: 96%     [facility] Eval - 04/16/18 1100      Referral    Referring Provider  AFIB clinic      Information for Trainer   Goals  To lose 20lbs, to be more limber & build better balance & strength, & have less aches & pains, become more physically active & improve overall health.     Current Exercise  Walks dog but not in extreme temps.     Orthopedic Concerns  LTKR july '16, LT wrist sprain x 4 months ago.    Pertinent Medical History  AFIB, HTN, heart valve '06, sleep apnea-CPAP    Current Barriers  States she hasn't been the beiggest exerciser in the past.     Medications that affect exercise  Oxgen      Mobility and Daily Activities   I find it easy to walk up or down two or more flights of stairs.  1    I have no trouble taking out the trash.  4    I do housework such as vacuuming and dusting on my own without difficulty.  1    I can easily lift a gallon of milk (8lbs).  4    I can easily walk a mile.  2    I have no trouble reaching into high cupboards or reaching down to pick up something from the floor.  4    I do not have trouble doing out-door work such as Armed forces logistics/support/administrative officer, raking leaves, or gardening.  1      Mobility and Daily Activities   I feel younger than my age.  2    I feel independent.  2    I feel energetic.  1    I live an active life.   1    I feel strong.  1    I feel healthy.  2    I feel active as other people my age.  2      How fit and strong are you.   Fit and Strong Total Score  28      Past Medical History:  Diagnosis Date  . Anxiety   . Aortic stenosis    status post aortic valve replacement with St. Jude mechanical prosthesis  . 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   22mm by echo 06/2016 and 63mm by CT angin 2016  .  Chronic anticoagulation   . 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 Depression   . DJD (degenerative joint disease) of knee   . Dyslipidemia   . GERD (gastroesophageal reflux disease)    "several years ago; none since" (11/12/2012)  . Hyperlipidemia   . Hyperlipidemia LDL goal <70 01/19/2017  . Hypertension   . Left atrial enlargement   . Mitral regurgitation 06/25/2016   Mild moderate MR by echo 06/2016  . Obesity   . Persistent atrial fibrillation    s/p afib ablation x 2 at [facility]  . Sleep apnea    On CPAP at 12cm H2O  . 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 BURR HOLE FOR SUBDURAL HEMATOMA  2006  . CARDIAC VALVE REPLACEMENT  2006   St. Jude AVR  . CARDIOVERSION N/A 07/22/2012   Procedure: CARDIOVERSION;  Surgeon: [name], MD;  Location: [facility] ENDOSCOPY;  Service: Cardiovascular;  Laterality: N/A;  . CARDIOVERSION N/A 11/25/2012   Procedure: CARDIOVERSION;  Surgeon: [name], MD;  Location: [facility] ENDOSCOPY;  Service: Cardiovascular;  Laterality: N/A;  . CARDIOVERSION N/A 12/30/2012   Procedure: CARDIOVERSION;  Surgeon: [name], MD;  Location: [facility] ENDOSCOPY;  Service: Cardiovascular;  Laterality: N/A;  h&p in file-HW   . CARDIOVERSION N/A 03/30/2013   Procedure: CARDIOVERSION;  Surgeon: [name], MD;  Location: [facility] ENDOSCOPY;  Service: Cardiovascular;  Laterality: N/A;  . ELECTROPHYSIOLOGIC STUDY  11/2013   Afib ablation x 2 (11/2013 and 10/2015) at [facility] by Dr Clyda Hurdle with recurrence post ablation  . KNEE ARTHROSCOPY Left 1980's   "2" (11/12/2012)  . KNEE SURGERY Left 1980's   "after 2 scopes they went in and did some kind of OR" (11/12/2012)  . TEE WITHOUT CARDIOVERSION N/A 07/22/2012   Procedure: TRANSESOPHAGEAL ECHOCARDIOGRAM (TEE);  Surgeon: [name], MD;  Location: [facility] ENDOSCOPY;  Service:  Cardiovascular;  Laterality: N/A;  Rm 2034  . TEE WITHOUT CARDIOVERSION N/A 10/07/2013   Procedure: TRANSESOPHAGEAL ECHOCARDIOGRAM (TEE);  Surgeon: [name], MD;  Location: [facility] ENDOSCOPY;  Service: Cardiovascular;  Laterality: N/A;  . TOTAL KNEE ARTHROPLASTY Left 11/05/2014   Procedure: TOTAL KNEE ARTHROPLASTY;  Surgeon: Dorna Leitz, MD;  Location: [place];  Service: Orthopedics;  Laterality: Left;  . TUBAL LIGATION  1980's   Social History   Tobacco Use  Smoking Status Never Smoker  Smokeless Tobacco Never Used     Ms. Prosser is registered for the 12-week, twice weekly PREP at the [facility] which started yesterday.   [name] 04/16/2018, 12:02 PM

## 2018-04-17 ENCOUNTER — Other Ambulatory Visit: Payer: Self-pay | Admitting: Cardiology

## 2018-04-17 NOTE — Telephone Encounter (Signed)
This is a pt of Dr. Harrell Gave now.

## 2018-04-18 NOTE — Telephone Encounter (Signed)
This pt is Dr. Judeth Cornfield pt. Pt has an appt with Dr. Harrell Gave. Please address this with Dr. Harrell Gave. Thanks

## 2018-04-21 NOTE — Telephone Encounter (Signed)
I believe she is switching providers. I have not seen her yet, but I will refill these for now and we can discuss further at her visit. Thank you.

## 2018-04-22 DIAGNOSIS — Z7901 Long term (current) use of anticoagulants: Secondary | ICD-10-CM | POA: Diagnosis not present

## 2018-04-22 DIAGNOSIS — S8011XD Contusion of right lower leg, subsequent encounter: Secondary | ICD-10-CM | POA: Diagnosis not present

## 2018-04-22 DIAGNOSIS — Z5181 Encounter for therapeutic drug level monitoring: Secondary | ICD-10-CM | POA: Diagnosis not present

## 2018-04-29 DIAGNOSIS — Z7901 Long term (current) use of anticoagulants: Secondary | ICD-10-CM | POA: Diagnosis not present

## 2018-04-30 NOTE — Progress Notes (Signed)
[facility] PREP Weekly Session   Patient Details  Name: [name] MRN: [id number] Date of Birth: 08-Jul-1945 Age: 73 y.o. PCP: Maurice Small, MD  Vitals:   04/30/18 1626  Weight: 234 lb 3.2 oz (106.2 kg)    [facility] Weekly seesion - 04/30/18 1600      Weekly Session   Topic Discussed  Goal setting and welcome to the program;Healthy eating tips      Fun things you did since last meeting:"sewed, played w/dogs" Things you are grateful for:"this class" Nutrition celebrations:"kept on track 90% of the time" Barriers:"shob, lack of energy"  [name] 04/30/2018, 4:26 PM

## 2018-05-02 DIAGNOSIS — R609 Edema, unspecified: Secondary | ICD-10-CM | POA: Diagnosis not present

## 2018-05-14 NOTE — Progress Notes (Signed)
[facility] Report   Patient Details  Name: [name] MRN: [id number] Date of Birth: 1946/03/25 Age: 73 y.o. PCP: Maurice Small, MD  Vitals:   05/13/18 1937  Weight: 235 lb 9.6 oz (106.9 kg)      Past Medical History:  Diagnosis Date  . Anxiety   . Aortic stenosis    status post aortic valve replacement with St. Jude mechanical prosthesis  . 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   37mm by echo 06/2016 and 41mm by CT angin 2016  . Chronic anticoagulation   . 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 Depression   . DJD (degenerative joint disease) of knee   . Dyslipidemia   . GERD (gastroesophageal reflux disease)    "several years ago; none since" (11/12/2012)  . Hyperlipidemia   . Hyperlipidemia LDL goal <70 01/19/2017  . Hypertension   . Left atrial enlargement   . Mitral regurgitation 06/25/2016   Mild moderate MR by echo 06/2016  . Obesity   . Persistent atrial fibrillation    s/p afib ablation x 2 at [facility]  . Sleep apnea    On CPAP at 12cm H2O  . 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 BURR HOLE FOR SUBDURAL HEMATOMA  2006  . CARDIAC VALVE REPLACEMENT  2006   St. Jude AVR  . CARDIOVERSION N/A 07/22/2012   Procedure: CARDIOVERSION;  Surgeon: [name], MD;  Location: [facility] ENDOSCOPY;  Service: Cardiovascular;  Laterality: N/A;  . CARDIOVERSION N/A 11/25/2012   Procedure: CARDIOVERSION;  Surgeon: [name], MD;  Location: [facility] ENDOSCOPY;  Service: Cardiovascular;  Laterality: N/A;  . CARDIOVERSION N/A 12/30/2012   Procedure: CARDIOVERSION;  Surgeon: [name], MD;  Location: [facility] ENDOSCOPY;  Service: Cardiovascular;  Laterality: N/A;  h&p in file-HW   . CARDIOVERSION N/A 03/30/2013   Procedure: CARDIOVERSION;  Surgeon: [name], MD;  Location: [facility] ENDOSCOPY;   Service: Cardiovascular;  Laterality: N/A;  . ELECTROPHYSIOLOGIC STUDY  11/2013   Afib ablation x 2 (11/2013 and 10/2015) at [facility] by Dr Clyda Hurdle with recurrence post ablation  . KNEE ARTHROSCOPY Left 1980's   "2" (11/12/2012)  . KNEE SURGERY Left 1980's   "after 2 scopes they went in and did some kind of OR" (11/12/2012)  . TEE WITHOUT CARDIOVERSION N/A 07/22/2012   Procedure: TRANSESOPHAGEAL ECHOCARDIOGRAM (TEE);  Surgeon: [name], MD;  Location: [facility] ENDOSCOPY;  Service: Cardiovascular;  Laterality: N/A;  Rm 2034  . TEE WITHOUT CARDIOVERSION N/A 10/07/2013   Procedure: TRANSESOPHAGEAL ECHOCARDIOGRAM (TEE);  Surgeon: [name], MD;  Location: [facility] ENDOSCOPY;  Service: Cardiovascular;  Laterality: N/A;  . TOTAL KNEE ARTHROPLASTY Left 11/05/2014   Procedure: TOTAL KNEE ARTHROPLASTY;  Surgeon: Dorna Leitz, MD;  Location: [place];  Service: Orthopedics;  Laterality: Left;  . TUBAL LIGATION  1980's   Social History   Tobacco Use  Smoking Status Never Smoker  Smokeless Tobacco Never Used        [name] 05/14/2018, 7:37 PM

## 2018-05-15 ENCOUNTER — Other Ambulatory Visit: Payer: Self-pay | Admitting: Cardiology

## 2018-05-20 NOTE — Progress Notes (Signed)
[facility] PREP Weekly Session   Patient Details  Name: [name] MRN: [id number] Date of Birth: [birth year] Age: 73 y.o. PCP: Maurice Small, MD  Vitals:   05/20/18 2044  Weight: 234 lb 6.4 oz (106.3 kg)    [facility] Weekly seesion - 05/20/18 2000      Weekly Session   Topic Discussed  Stress management and problem solving      Fun things you did since last meeting:"wedding" Things you are grateful for:"feeling a little better. Walked up the hill from parking lot much easier" Nutrition celebrations:"Have eaten pretty sensibly"  [name] 05/20/2018, 8:45 PM

## 2018-05-27 ENCOUNTER — Other Ambulatory Visit: Payer: Self-pay

## 2018-05-27 ENCOUNTER — Ambulatory Visit (INDEPENDENT_AMBULATORY_CARE_PROVIDER_SITE_OTHER): Payer: Medicare Other | Admitting: Cardiology

## 2018-05-27 ENCOUNTER — Ambulatory Visit (INDEPENDENT_AMBULATORY_CARE_PROVIDER_SITE_OTHER): Payer: Medicare Other | Admitting: Pharmacist Clinician (PhC)/ Clinical Pharmacy Specialist

## 2018-05-27 ENCOUNTER — Encounter: Payer: Self-pay | Admitting: Cardiology

## 2018-05-27 VITALS — BP 152/84 | HR 74 | Wt 230.0 lb

## 2018-05-27 DIAGNOSIS — Z79899 Other long term (current) drug therapy: Secondary | ICD-10-CM

## 2018-05-27 DIAGNOSIS — I4819 Other persistent atrial fibrillation: Secondary | ICD-10-CM | POA: Diagnosis not present

## 2018-05-27 DIAGNOSIS — Z5181 Encounter for therapeutic drug level monitoring: Secondary | ICD-10-CM | POA: Diagnosis not present

## 2018-05-27 DIAGNOSIS — R0609 Other forms of dyspnea: Secondary | ICD-10-CM

## 2018-05-27 DIAGNOSIS — Z7189 Other specified counseling: Secondary | ICD-10-CM

## 2018-05-27 DIAGNOSIS — Z952 Presence of prosthetic heart valve: Secondary | ICD-10-CM | POA: Diagnosis not present

## 2018-05-27 DIAGNOSIS — G4733 Obstructive sleep apnea (adult) (pediatric): Secondary | ICD-10-CM | POA: Diagnosis not present

## 2018-05-27 DIAGNOSIS — I480 Paroxysmal atrial fibrillation: Secondary | ICD-10-CM

## 2018-05-27 DIAGNOSIS — I77810 Thoracic aortic ectasia: Secondary | ICD-10-CM

## 2018-05-27 DIAGNOSIS — I10 Essential (primary) hypertension: Secondary | ICD-10-CM | POA: Diagnosis not present

## 2018-05-27 LAB — POCT INR: INR: 2.2 (ref 2.0–3.0)

## 2018-05-27 NOTE — Patient Instructions (Addendum)
Medication Instructions:  Your Physician recommend you continue on your current medication as directed.    If you need a refill on your cardiac medications before your next appointment, please call your pharmacy.   Lab work: Your physician recommends that you return for lab work today (TSH, Lipid, CMP)  If you have labs (blood work) drawn today and your tests are completely normal, you will receive your results only by: Marland Kitchen MyChart Message (if you have MyChart) OR . A paper copy in the mail If you have any lab test that is abnormal or we need to change your treatment, we will call you to review the results.  Testing/Procedures: None  Follow-Up: At [facility], you and your health needs are our priority.  As part of our continuing mission to provide you with exceptional heart care, we have created designated Provider Care Teams.  These Care Teams include your primary Cardiologist (physician) and Advanced Practice Providers (APPs -  Physician Assistants and Nurse Practitioners) who all work together to provide you with the care you need, when you need it. You will need a follow up appointment in 2 months.  Please call our office 2 months in advance to schedule this appointment.  You may see Dr. Harrell Gave or one of the following Advanced Practice Providers on your designated Care Team:   Rosaria Ferries, PA-C . Jory Sims, DNP, ANP

## 2018-05-27 NOTE — Progress Notes (Signed)
Cardiology Office Note:    Date:  05/27/2018   ID:  [name], DOB 12/20/1945, MRN [id number]  PCP:  Maurice Small, MD  Cardiologist:  Buford Dresser, MD PhD (has previously seen Dr. Radford Pax and Dr. Haroldine Laws)  Referring MD: Maurice Small, MD   CC: establish care with me/follow up of cardiac issues  History of Present Illness:    [name] is a 73 y.o. female with a hx of morbid obesity, mechanical AVR in 2006 by Dr. Prescott Gum, paroxysmal atrial fibrillation (see hx below), OSA on CPAP who is seen as a new consult at the request of Maurice Small, MD for the evaluation and management of her cardiac conditions.  Paroxysmal atrial fibrillation: trialed on tikosyn with DCCV, did not maintain NSR. Amiodarone loaded, then DCCV, but did not maintain NSR, Had afib ablation at [facility], did not maintain, and then had repeat ablation 10/2015 at [facility], placed on amiodarone. Has maintained sinus rhythm.  Patient concerns today: looking for a fresh outlook on her management of atrial fib.   Started cardiac rehab, doing silver sneakers as well. Working on weight loss. Wants to improve her overall health picture. Has felt very well the last three weeks, but the last three years she has felt poorly about 80% of the time. Has felt fatigued, short of breath with exertion, just not felt like doing doing anything. Has been better since getting more active.   Has not been routinely going to a coumadin clinic in some time. Has been at least a month since a check. Goal INR 2-2.5 given history of bleeds.   Denies chest pain, shortness of breath at rest, PND, orthopnea, LE edema or unexpected weight gain. No syncope or palpitations.  Past Medical History:  Diagnosis Date  . Anxiety   . Aortic stenosis    status post aortic valve replacement with St. Jude mechanical prosthesis  . 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   84mm by echo 06/2016 and 40mm by CT angin 2016  . Chronic anticoagulation     . 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 Depression   . DJD (degenerative joint disease) of knee   . Dyslipidemia   . GERD (gastroesophageal reflux disease)    "several years ago; none since" (11/12/2012)  . Hyperlipidemia   . Hyperlipidemia LDL goal <70 01/19/2017  . Hypertension   . Left atrial enlargement   . Mitral regurgitation 06/25/2016   Mild moderate MR by echo 06/2016  . Obesity   . Persistent atrial fibrillation    s/p afib ablation x 2 at [facility]  . Sleep apnea    On CPAP at 12cm H2O  . 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 - now cleared by neurosurgery to maintain INR 2-2.5    Past Surgical History:  Procedure Laterality Date  . BURR HOLE FOR SUBDURAL HEMATOMA  2006  . CARDIAC VALVE REPLACEMENT  2006   St. Jude AVR  . CARDIOVERSION N/A 07/22/2012   Procedure: CARDIOVERSION;  Surgeon: [name], MD;  Location: [facility] ENDOSCOPY;  Service: Cardiovascular;  Laterality: N/A;  . CARDIOVERSION N/A 11/25/2012   Procedure: CARDIOVERSION;  Surgeon: [name], MD;  Location: [facility] ENDOSCOPY;  Service: Cardiovascular;  Laterality: N/A;  . CARDIOVERSION N/A 12/30/2012   Procedure: CARDIOVERSION;  Surgeon: [name], MD;  Location: [facility] ENDOSCOPY;  Service: Cardiovascular;  Laterality: N/A;  h&p in file-HW   .  CARDIOVERSION N/A 03/30/2013   Procedure: CARDIOVERSION;  Surgeon: [name], MD;  Location: [facility] ENDOSCOPY;  Service: Cardiovascular;  Laterality: N/A;  . ELECTROPHYSIOLOGIC STUDY  11/2013   Afib ablation x 2 (11/2013 and 10/2015) at [facility] by Dr Clyda Hurdle with recurrence post ablation  . KNEE ARTHROSCOPY Left 1980's   "2" (11/12/2012)  . KNEE SURGERY Left 1980's   "after 2 scopes they went in and did some kind of OR" (11/12/2012)  . TEE WITHOUT CARDIOVERSION N/A 07/22/2012   Procedure: TRANSESOPHAGEAL ECHOCARDIOGRAM (TEE);  Surgeon: [name], MD;  Location: [facility] ENDOSCOPY;  Service: Cardiovascular;   Laterality: N/A;  Rm 2034  . TEE WITHOUT CARDIOVERSION N/A 10/07/2013   Procedure: TRANSESOPHAGEAL ECHOCARDIOGRAM (TEE);  Surgeon: [name], MD;  Location: [facility] ENDOSCOPY;  Service: Cardiovascular;  Laterality: N/A;  . TOTAL KNEE ARTHROPLASTY Left 11/05/2014   Procedure: TOTAL KNEE ARTHROPLASTY;  Surgeon: Dorna Leitz, MD;  Location: [place];  Service: Orthopedics;  Laterality: Left;  . TUBAL LIGATION  1980's    Current Medications: Current Outpatient Medications on File Prior to Visit  Medication Sig  . ALPRAZolam (XANAX) 0.5 MG tablet Take 0.5 mg by mouth 2 (two) times daily as needed. AS NEEDED FOR ANXIETY  . amiodarone (PACERONE) 200 MG tablet TAKE 1 TABLET BY MOUTH DAILY  . Ascorbic Acid (VITAMIN C) 1000 MG tablet Take 2,000 mg by mouth daily.  . calcium-vitamin D (OSCAL WITH D) 500-200 MG-UNIT per tablet Take 2 tablets by mouth daily.   . Cholecalciferol (VITAMIN D) 2000 UNITS tablet Take 4,000 Units by mouth daily.  Marland Kitchen DILT-XR 240 MG 24 hr capsule TAKE 1 CAPSULE BY MOUTH EVERY MORNING ON AN EMPTY STOMACH  . DULoxetine (CYMBALTA) 60 MG capsule Take 60 mg by mouth daily.  . furosemide (LASIX) 80 MG tablet Take 1 tablet (80 mg total) by mouth daily. Please keep upcoming appt in February for future refills. Thank you  . levothyroxine (SYNTHROID, LEVOTHROID) 100 MCG tablet Take 100 mcg daily before breakfast by mouth.  . losartan (COZAAR) 100 MG tablet Take 1 tablet (100 mg total) by mouth daily.  . Multiple Vitamin (MULTIVITAMIN WITH MINERALS) TABS Take 1 tablet by mouth daily.  . Multiple Vitamins-Iron (MULTIVITAMINS WITH IRON) TABS tablet Take 1 tablet by mouth daily.  . potassium chloride SA (K-DUR,KLOR-CON) 20 MEQ tablet TAKE 2 TABLETS BY MOUTH EVERY MORNING AND 1 TABLET EVERY EVENING  . pravastatin (PRAVACHOL) 20 MG tablet Take 1 tablet (20 mg total) by mouth every evening.  . SYMBICORT 160-4.5 MCG/ACT inhaler INHALE 2 PUFFS BY MOUTH INTO THE LUNGS TWICE DAILY  . tretinoin (RETIN-A) 0.05  % cream Apply 1 application topically at bedtime.   Marland Kitchen warfarin (COUMADIN) 4 MG tablet TAKE 1/2 TO 1 TABLET BY MOUTH DAILY AS DIRECTED BY COUMADIN CLINIC  . zolpidem (AMBIEN) 10 MG tablet Take 5 mg by mouth at bedtime as needed for sleep.   No current facility-administered medications on file prior to visit.      Allergies:   Codeine; Beta adrenergic blockers; Metoprolol; Propoxyphene; Toprol xl [metoprolol tartrate]; and Dilantin [phenytoin sodium extended]   Social History   Socioeconomic History  . Marital status: Married    Spouse name: Not on file  . Number of children: Not on file  . Years of education: Not on file  . Highest education level: Not on file  Occupational History  . Not on file  Social Needs  . Financial resource strain: Not on file  . Food insecurity:  Worry: Not on file    Inability: Not on file  . Transportation needs:    Medical: Not on file    Non-medical: Not on file  Tobacco Use  . Smoking status: Never Smoker  . Smokeless tobacco: Never Used  Substance and Sexual Activity  . Alcohol use: No    Comment: 11/12/2012 "no alcohol in the last 9 years"  . Drug use: No  . Sexual activity: Not Currently  Lifestyle  . Physical activity:    Days per week: Not on file    Minutes per session: Not on file  . Stress: Not on file  Relationships  . Social connections:    Talks on phone: Not on file    Gets together: Not on file    Attends religious service: Not on file    Active member of club or organization: Not on file    Attends meetings of clubs or organizations: Not on file    Relationship status: Not on file  Other Topics Concern  . Not on file  Social History Narrative   Pt lives in [place] with spouse.  Retired     Family History: The patient's family history includes Heart disease in her brother; Hyperlipidemia in her father; Hypertension in her father and mother; Lung cancer in her mother.  ROS:   Please see the history of present  illness.  Additional pertinent ROS:  Constitutional: Negative for chills, fever, night sweats, unintentional weight loss  HENT: Negative for ear pain and hearing loss.   Eyes: Negative for loss of vision and eye pain.  Respiratory: Negative for cough, sputum, wheezing.   Cardiovascular: See HPI. Gastrointestinal: Negative for abdominal pain, melena, and hematochezia.  Genitourinary: Negative for dysuria and hematuria.  Musculoskeletal: Negative for falls and myalgias.  Skin: Negative for itching and rash.  Neurological: Negative for focal weakness, focal sensory changes and loss of consciousness.  Endo/Heme/Allergies: Does not bruise/bleed easily.    EKGs/Labs/Other Studies Reviewed:    The following studies were reviewed today: Myoview 7/17 No ischemia  Echo 5/19 - Left ventricle: The cavity size was mildly dilated. There was moderate concentric hypertrophy. Systolic function was normal. The estimated ejection fraction was in the range of 60% to 65%. Wall motion was normal; there were no regional wall motion abnormalities. Doppler parameters are consistent with high ventricular filling pressure. - Aortic valve: A mechanical prosthesis was present and functioning normally. Mean gradient (S): 14 mm Hg. - Aorta: Ascending aorta diameter: 44 mm (ED). - Ascending aorta: The ascending aorta was moderately dilated. - Mitral valve: Calcified annulus. Mild diffuse calcification of the anterior leaflet and posterior leaflet. There was mild to moderate regurgitation. - Left atrium: The atrium was severely dilated. - Right atrium: The atrium was mildly dilated. - Pulmonary arteries: PA peak pressure: 42 mm Hg (S).  EKG:  EKG is personally reviewed.  The ekg ordered today demonstrates sinus rhythm with RBBB, PVC  Recent Labs: 07/13/2017: TSH 2.968 07/15/2017: BUN 14; Creatinine, Ser 0.67; Hemoglobin 9.9; Platelets 342; Potassium 4.8; Sodium 137 08/09/2017: ALT 16  Recent  Lipid Panel    Component Value Date/Time   CHOL 181 08/09/2017 0811   TRIG 79 08/09/2017 0811   HDL 64 08/09/2017 0811   CHOLHDL 2.8 08/09/2017 0811   LDLCALC 101 (H) 08/09/2017 0811    Physical Exam:    VS:  BP (!) 152/84   Pulse 74   Wt 230 lb (104.3 kg)   SpO2 94%   BMI 36.02  kg/m     Wt Readings from Last 3 Encounters:  05/27/18 230 lb (104.3 kg)  05/20/18 234 lb 6.4 oz (106.3 kg)  05/13/18 235 lb 9.6 oz (106.9 kg)     GEN: Well nourished, well developed in no acute distress HEENT: Normal NECK: No JVD; No carotid bruits LYMPHATICS: No lymphadenopathy CARDIAC: regular rhythm, normal S1 and S2, no murmurs, rubs, gallops. Radial and DP pulses 2+ bilaterally. RESPIRATORY:  Clear to auscultation without rales, wheezing or rhonchi  ABDOMEN: Soft, non-tender, non-distended MUSCULOSKELETAL:  No edema; No deformity  SKIN: Warm and dry NEUROLOGIC:  Alert and oriented x 3 PSYCHIATRIC:  Normal affect   ASSESSMENT:    1. Dyspnea on exertion   2. Medication management   3. Paroxysmal atrial fibrillation (HCC)   4. Essential hypertension   5. Ascending aorta dilatation (HCC)   6. Obstructive sleep apnea   7. S/P AVR (aortic valve replacement)   8. Counseling on health promotion and disease prevention    PLAN:    Dyspnea on exertion: appears euvolemic on exam. Improving with exercise conditioning. Recommend continuing exercise program.  Paroxysmal atrial fibrillation -CHA2DS2/VAS Stroke Risk Points= 4  -having her establish with our coumadin clinic today -rhythm control with amiodarone, rate control with diltiazem 240 mg daily -will check TSH, liver function today given long term amiodarone use -if breathing worsens, would recheck PFTs.  Mechanical aortic valve (prior severe aortic stenosis): we discussed that currently only coumadin is recommended for anticoagulation, but if any DOACs do receive indications she would be interested in changing. 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 This was noted at 4.4 cm on echo 06/2016 and 4.4 cm on echo 08/2017. Stable.   Primary prevention: -recommend heart healthy/Mediterranean diet, with whole grains, fruits, vegetable, fish, lean meats, nuts, and olive oil. Limit salt. -recommend moderate walking, 3-5 times/week for 30-50 minutes each session. Aim for at least 150 minutes.week. Goal should be pace of 3 miles/hours, or walking 1.5 miles in 30 minutes -recommend avoidance of tobacco products. Avoid excess alcohol. -Additional risk factor control:  -Diabetes: A1c is not available to me, denies prior history  -Lipids: drew lipids, LDL 101. No known CAD. On pravastatin 20 mg daily.  -Blood pressure control: as above  -Weight: BMI 36, counseled on healthy lifestyle for weight loss -ASCVD risk score: The 10-year ASCVD risk score Mikey Bussing DC Brooke Bonito., et al., 2013) is: 20.1%   Values used to calculate the score:     Age: 68 years     Sex: Female     Is Non-Hispanic African American: No     Diabetic: No     Tobacco smoker: No     Systolic Blood Pressure: 379 mmHg     Is BP treated: Yes     HDL Cholesterol: 71 mg/dL     Total Cholesterol: 175 mg/dL    Plan for follow up: 2 mos  TIME SPENT WITH PATIENT: 25 minutes of direct patient care. More than 50% of that time was spent on coordination of care and counseling regarding medication management of cardiac conditions.  Buford Dresser, MD, PhD   [facility]   Medication Adjustments/Labs and Tests Ordered: Current medicines are  reviewed at length with the patient today.  Concerns regarding medicines are outlined above.  Orders Placed  This Encounter  Procedures  . Lipid panel  . Comprehensive metabolic panel  . TSH  . EKG 12-Lead   No orders of the defined types were placed in this encounter.   Patient Instructions  Medication Instructions:  Your Physician recommend you continue on your current medication as directed.    If you need a refill on your cardiac medications before your next appointment, please call your pharmacy.   Lab work: Your physician recommends that you return for lab work today (TSH, Lipid, CMP)  If you have labs (blood work) drawn today and your tests are completely normal, you will receive your results only by: Marland Kitchen MyChart Message (if you have MyChart) OR . A paper copy in the mail If you have any lab test that is abnormal or we need to change your treatment, we will call you to review the results.  Testing/Procedures: None  Follow-Up: At [facility], you and your health needs are our priority.  As part of our continuing mission to provide you with exceptional heart care, we have created designated Provider Care Teams.  These Care Teams include your primary Cardiologist (physician) and Advanced Practice Providers (APPs -  Physician Assistants and Nurse Practitioners) who all work together to provide you with the care you need, when you need it. You will need a follow up appointment in 2 months.  Please call our office 2 months in advance to schedule this appointment.  You may see Dr. Harrell Gave or one of the following Advanced Practice Providers on your designated Care Team:   Rosaria Ferries, PA-C . Jory Sims, DNP, ANP        Signed, Buford Dresser, MD PhD 05/27/2018 11:01 AM    [place]

## 2018-05-28 LAB — LIPID PANEL
Chol/HDL Ratio: 2.5 ratio (ref 0.0–4.4)
Cholesterol, Total: 175 mg/dL (ref 100–199)
HDL: 71 mg/dL (ref >39)
LDL Calculated: 84 mg/dL (ref 0–99)
Triglycerides: 100 mg/dL (ref 0–149)
VLDL CHOLESTEROL CAL: 20 mg/dL (ref 5–40)

## 2018-05-28 LAB — COMPREHENSIVE METABOLIC PANEL
ALBUMIN: 4.5 g/dL (ref 3.7–4.7)
ALT: 13 IU/L (ref 0–32)
AST: 21 IU/L (ref 0–40)
Albumin/Globulin Ratio: 1.7 (ref 1.2–2.2)
Alkaline Phosphatase: 102 IU/L (ref 39–117)
BUN/Creatinine Ratio: 15 (ref 12–28)
BUN: 13 mg/dL (ref 8–27)
Bilirubin Total: 0.4 mg/dL (ref 0.0–1.2)
CO2: 22 mmol/L (ref 20–29)
CREATININE: 0.85 mg/dL (ref 0.57–1.00)
Calcium: 8.8 mg/dL (ref 8.7–10.3)
Chloride: 101 mmol/L (ref 96–106)
GFR calc Af Amer: 79 mL/min/{1.73_m2} (ref >59)
GFR calc non Af Amer: 69 mL/min/{1.73_m2} (ref >59)
GLUCOSE: 91 mg/dL (ref 65–99)
Globulin, Total: 2.6 g/dL (ref 1.5–4.5)
Potassium: 4.3 mmol/L (ref 3.5–5.2)
Sodium: 140 mmol/L (ref 134–144)
Total Protein: 7.1 g/dL (ref 6.0–8.5)

## 2018-05-28 LAB — TSH: TSH: 4.56 u[IU]/mL — ABNORMAL HIGH (ref 0.450–4.500)

## 2018-05-30 ENCOUNTER — Encounter: Payer: Self-pay | Admitting: Cardiology

## 2018-06-02 ENCOUNTER — Telehealth: Payer: Self-pay | Admitting: Cardiology

## 2018-06-02 NOTE — Telephone Encounter (Signed)
Pt updated with lab results along with Dr. Judeth Cornfield recommendation. Pt verbalized understanding.

## 2018-06-02 NOTE — Telephone Encounter (Signed)
Follow Up: ° ° ° °Returning your call from this morning. °

## 2018-06-15 NOTE — Progress Notes (Signed)
[facility] PREP Weekly Session   Patient Details  Name: [name] MRN: [id number] Date of Birth: 03-25-1946 Age: 73 y.o. PCP: Maurice Small, MD  There were no vitals filed for this visit.  States she went to another [employer] class and rode her stationary bike at home.    [name] 06/15/2018, 9:26 PM

## 2018-07-09 ENCOUNTER — Other Ambulatory Visit: Payer: Self-pay | Admitting: Cardiology

## 2018-07-10 ENCOUNTER — Telehealth: Payer: Self-pay

## 2018-07-10 NOTE — Telephone Encounter (Signed)
lmtcb

## 2018-07-10 NOTE — Telephone Encounter (Signed)
lmom for prescreen/drive thru 

## 2018-07-10 NOTE — Telephone Encounter (Signed)
Pt called back returning your call °

## 2018-07-10 NOTE — Telephone Encounter (Signed)
Pt called returning your call 

## 2018-07-10 NOTE — Telephone Encounter (Signed)
Virtual Visit Pre-Appointment Phone Call  Steps For Call:  1. Confirm consent - "In the setting of the current Covid19 crisis, you are scheduled for a (phone or video) visit with your provider on (date) at (time).  Just as we do with many in-office visits, in order for you to participate in this visit, we must obtain consent.  If you'd like, I can send this to your mychart (if signed up) or email for you to review.  Otherwise, I can obtain your verbal consent now.  All virtual visits are billed to your insurance company just like a normal visit would be.  By agreeing to a virtual visit, we'd like you to understand that the technology does not allow for your provider to perform an examination, and thus may limit your provider's ability to fully assess your condition.  Finally, though the technology is pretty good, we cannot assure that it will always work on either your or our end, and in the setting of a video visit, we may have to convert it to a phone-only visit.  In either situation, we cannot ensure that we have a secure connection.  Are you willing to proceed?"  2. Give patient instructions for WebEx download to smartphone as below if video visit  3. Advise patient to be prepared with any vital sign or heart rhythm information, their current medicines, and a piece of paper and pen handy for any instructions they may receive the day of their visit  4. Inform patient they will receive a phone call 15 minutes prior to their appointment time (may be from unknown caller ID) so they should be prepared to answer  5. Confirm that appointment type is correct in Epic appointment notes (video vs telephone)    TELEPHONE CALL NOTE  [name] has been deemed a candidate for a follow-up tele-health visit to limit community exposure during the Covid-19 pandemic. I spoke with the patient via phone to ensure availability of phone/video source, confirm preferred email & phone number, and discuss  instructions and expectations.  I reminded [name] to be prepared with any vital sign and/or heart rhythm information that could potentially be obtained via home monitoring, at the time of her visit. I reminded [name] to expect a phone call at the time of her visit if her visit.  Did the patient verbally acknowledge consent to treatment? Patient provided verbal consent.  [name], Meredith, [name] 07/10/2018 3:01 PM   DOWNLOADING THE [place]  - If Apple, go to [employer] and type in WebEx in the search bar. [place] [employer], the blue/green circle. The app is free but as with any other app downloads, their phone may require them to verify saved payment information or Apple password. The patient does NOT have to create an account.  - If Android, ask patient to go to [employer] and type in WebEx in the search bar. [place] [employer], the blue/green circle. The app is free but as with any other app downloads, their phone may require them to verify saved payment information or Android password. The patient does NOT have to create an account.   CONSENT FOR TELE-HEALTH VISIT - PLEASE REVIEW  I hereby voluntarily request, consent and authorize [facility] and its employed or contracted physicians, physician assistants, nurse practitioners or other licensed health care professionals (the Practitioner), to provide me with telemedicine health care services (the "Services") as deemed necessary by the  treating Practitioner. I acknowledge and consent to receive the Services by the Practitioner via telemedicine. I understand that the telemedicine visit will involve communicating with the Practitioner through live audiovisual communication technology and the disclosure of certain medical information by electronic transmission. I acknowledge that I have been given the opportunity to request an in-person assessment or other available alternative  prior to the telemedicine visit and am voluntarily participating in the telemedicine visit.  I understand that I have the right to withhold or withdraw my consent to the use of telemedicine in the course of my care at any time, without affecting my right to future care or treatment, and that the Practitioner or I may terminate the telemedicine visit at any time. I understand that I have the right to inspect all information obtained and/or recorded in the course of the telemedicine visit and may receive copies of available information for a reasonable fee.  I understand that some of the potential risks of receiving the Services via telemedicine include:  Marland Kitchen Delay or interruption in medical evaluation due to technological equipment failure or disruption; . Information transmitted may not be sufficient (e.g. poor resolution of images) to allow for appropriate medical decision making by the Practitioner; and/or  . In rare instances, security protocols could fail, causing a breach of personal health information.  Furthermore, I acknowledge that it is my responsibility to provide information about my medical history, conditions and care that is complete and accurate to the best of my ability. I acknowledge that Practitioner's advice, recommendations, and/or decision may be based on factors not within their control, such as incomplete or inaccurate data provided by me or distortions of diagnostic images or specimens that may result from electronic transmissions. I understand that the practice of medicine is not an exact science and that Practitioner makes no warranties or guarantees regarding treatment outcomes. I acknowledge that I will receive a copy of this consent concurrently upon execution via email to the email address I last provided but may also request a printed copy by calling the office of [place].    I understand that my insurance will be billed for this visit.   I have read or had this  consent read to me. . I understand the contents of this consent, which adequately explains the benefits and risks of the Services being provided via telemedicine.  . I have been provided ample opportunity to ask questions regarding this consent and the Services and have had my questions answered to my satisfaction. . I give my informed consent for the services to be provided through the use of telemedicine in my medical care  By participating in this telemedicine visit I agree to the above.

## 2018-07-11 ENCOUNTER — Other Ambulatory Visit: Payer: Self-pay

## 2018-07-11 ENCOUNTER — Telehealth: Payer: Self-pay | Admitting: *Deleted

## 2018-07-11 NOTE — Telephone Encounter (Signed)
lmom for the 2nd time regarding reschedule to ch st, prescreen, & driveup

## 2018-07-11 NOTE — Telephone Encounter (Signed)

## 2018-07-14 ENCOUNTER — Telehealth (INDEPENDENT_AMBULATORY_CARE_PROVIDER_SITE_OTHER): Payer: Medicare Other | Admitting: Cardiology

## 2018-07-14 ENCOUNTER — Encounter: Payer: Self-pay | Admitting: Cardiology

## 2018-07-14 VITALS — Ht 65.0 in | Wt 222.0 lb

## 2018-07-14 DIAGNOSIS — I480 Paroxysmal atrial fibrillation: Secondary | ICD-10-CM

## 2018-07-14 DIAGNOSIS — R0609 Other forms of dyspnea: Secondary | ICD-10-CM

## 2018-07-14 DIAGNOSIS — Z952 Presence of prosthetic heart valve: Secondary | ICD-10-CM | POA: Diagnosis not present

## 2018-07-14 NOTE — Progress Notes (Signed)
Virtual Visit via Telephone Note   This visit type was conducted due to national recommendations for restrictions regarding the COVID-19 Pandemic (e.g. social distancing) in an effort to limit this patient's exposure and mitigate transmission in our community.  Due to her co-morbid illnesses, this patient is at least at moderate risk for complications without adequate follow up.  This format is felt to be most appropriate for this patient at this time.  The patient did not have access to video technology/had technical difficulties with video requiring transitioning to audio format only (telephone).  All issues noted in this document were discussed and addressed.  No physical exam could be performed with this format.  Please refer to the patient's chart for her  consent to telehealth for [facility].   Evaluation Performed:  Follow-up visit  Date:  07/14/2018   ID:  [name], DOB [birth year], MRN [id number]  Patient Location: Home  Provider Location: Home  PCP:  Maurice Small, MD  Cardiologist:  Buford Dresser, MD  Chief Complaint:  Follow up  History of Present Illness:    [name] is a 73 y.o. female who presents via audio/video conferencing for a telehealth visit today.    The patient does not have symptoms concerning for COVID-19 infection (fever, chills, cough, or new shortness of breath).   Please see note from 05/27/18 for full history.  Today: Has been doing very well over the last two months, better than she has felt in a long time. Was doing [employer] exercise, now transitioning to online, riding indoor bike. Shortness of breath minimal, much improved from prior. Tolerating all meds. No home bp cuff, not sure what her numbers have been recently. Has appt with drive through coumadin clinic for INR tomorrow. Only bleeding issues were 2 nosebleeds overnight 4-5 weeks ago, none since. Not sure if her CPAP was part of the issue.  Denies chest pain, shortness of  breath at rest or with normal exertion. No PND, orthopnea, LE edema or unexpected weight gain. No syncope or palpitations.   Past Medical History:  Diagnosis Date  . Anxiety   . Aortic stenosis    status post aortic valve replacement with St. Jude mechanical prosthesis  . 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   59mm by echo 06/2016 and 55mm by CT angin 2016  . Chronic anticoagulation   . 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 Depression   . DJD (degenerative joint disease) of knee   . Dyslipidemia   . GERD (gastroesophageal reflux disease)    "several years ago; none since" (11/12/2012)  . Hyperlipidemia   . Hyperlipidemia LDL goal <70 01/19/2017  . Hypertension   . Left atrial enlargement   . Mitral regurgitation 06/25/2016   Mild moderate MR by echo 06/2016  . Obesity   . Persistent atrial fibrillation    s/p afib ablation x 2 at [facility]  . Sleep apnea    On CPAP at 12cm H2O  . 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 BURR HOLE FOR SUBDURAL HEMATOMA  2006  . CARDIAC VALVE REPLACEMENT  2006   St. Jude AVR  . CARDIOVERSION N/A 07/22/2012   Procedure: CARDIOVERSION;  Surgeon: [name], MD;  Location: [place];  Service: Cardiovascular;  Laterality: N/A;  . CARDIOVERSION  N/A 11/25/2012   Procedure: CARDIOVERSION;  Surgeon: [name], MD;  Location: [place];  Service: Cardiovascular;  Laterality: N/A;  . CARDIOVERSION N/A 12/30/2012   Procedure: CARDIOVERSION;  Surgeon: [name], MD;  Location: [facility] ENDOSCOPY;  Service: Cardiovascular;  Laterality: N/A;  h&p in file-HW   . CARDIOVERSION N/A 03/30/2013   Procedure: CARDIOVERSION;  Surgeon: [name], MD;  Location: [facility] ENDOSCOPY;  Service: Cardiovascular;  Laterality: N/A;  . ELECTROPHYSIOLOGIC STUDY  11/2013   Afib ablation x 2  (11/2013 and 10/2015) at [facility] by Dr Clyda Hurdle with recurrence post ablation  . KNEE ARTHROSCOPY Left 1980's   "2" (11/12/2012)  . KNEE SURGERY Left 1980's   "after 2 scopes they went in and did some kind of OR" (11/12/2012)  . TEE WITHOUT CARDIOVERSION N/A 07/22/2012   Procedure: TRANSESOPHAGEAL ECHOCARDIOGRAM (TEE);  Surgeon: [name], MD;  Location: [facility] ENDOSCOPY;  Service: Cardiovascular;  Laterality: N/A;  Rm 2034  . TEE WITHOUT CARDIOVERSION N/A 10/07/2013   Procedure: TRANSESOPHAGEAL ECHOCARDIOGRAM (TEE);  Surgeon: [name], MD;  Location: [facility] ENDOSCOPY;  Service: Cardiovascular;  Laterality: N/A;  . TOTAL KNEE ARTHROPLASTY Left 11/05/2014   Procedure: TOTAL KNEE ARTHROPLASTY;  Surgeon: Dorna Leitz, MD;  Location: [place];  Service: Orthopedics;  Laterality: Left;  . TUBAL LIGATION  1980's     Current Meds  Medication Sig  . ALPRAZolam (XANAX) 0.5 MG tablet Take 0.5 mg by mouth 2 (two) times daily as needed. AS NEEDED FOR ANXIETY  . amiodarone (PACERONE) 200 MG tablet TAKE 1 TABLET BY MOUTH DAILY  . Ascorbic Acid (VITAMIN C) 1000 MG tablet Take 2,000 mg by mouth daily.  . calcium-vitamin D (OSCAL WITH D) 500-200 MG-UNIT per tablet Take 2 tablets by mouth daily.   . Cholecalciferol (VITAMIN D) 2000 UNITS tablet Take 4,000 Units by mouth daily.  Marland Kitchen DILT-XR 240 MG 24 hr capsule TAKE 1 CAPSULE BY MOUTH EVERY MORNING ON AN EMPTY STOMACH  . DULoxetine (CYMBALTA) 60 MG capsule Take 60 mg by mouth daily.  . furosemide (LASIX) 80 MG tablet TAKE 1 TABLET BY MOUTH DAILY, KEEP UPCOMING APPT IN FEBRUARY FOR FUTURE REFILLS  . levothyroxine (SYNTHROID, LEVOTHROID) 100 MCG tablet Take 100 mcg daily before breakfast by mouth.  . losartan (COZAAR) 100 MG tablet Take 1 tablet (100 mg total) by mouth daily.  . Multiple Vitamin (MULTIVITAMIN WITH MINERALS) TABS Take 1 tablet by mouth daily.  . potassium chloride SA (K-DUR,KLOR-CON) 20 MEQ tablet TAKE 2 TABLETS BY MOUTH EVERY MORNING AND 1 TABLET EVERY EVENING  .  pravastatin (PRAVACHOL) 20 MG tablet Take 1 tablet (20 mg total) by mouth every evening.  . tretinoin (RETIN-A) 0.05 % cream Apply 1 application topically at bedtime.   Marland Kitchen warfarin (COUMADIN) 4 MG tablet TAKE 1/2 TO 1 TABLET BY MOUTH DAILY AS DIRECTED BY COUMADIN CLINIC  . zolpidem (AMBIEN) 10 MG tablet Take 5 mg by mouth at bedtime as needed for sleep.     Allergies:   Codeine; Beta adrenergic blockers; Metoprolol; Propoxyphene; Toprol xl [metoprolol tartrate]; and Dilantin [phenytoin sodium extended]   Social History   Tobacco Use  . Smoking status: Never Smoker  . Smokeless tobacco: Never Used  Substance Use Topics  . Alcohol use: No    Comment: 11/12/2012 "no alcohol in the last 9 years"  . Drug use: No     Family Hx: The patient's family history includes Heart disease in her brother; Hyperlipidemia in her father; Hypertension in her father and  mother; Lung cancer in her mother.  ROS:   Please see the history of present illness.    All other systems reviewed and are negative.   Prior CV studies:   The following studies were reviewed today: Prior notes and studies reviewed  Labs/Other Tests and Data Reviewed:    EKG:  Prior ECG reviewed  Recent Labs: 07/15/2017: Hemoglobin 9.9; Platelets 342 05/27/2018: ALT 13; BUN 13; Creatinine, Ser 0.85; Potassium 4.3; Sodium 140; TSH 4.560   Recent Lipid Panel Lab Results  Component Value Date/Time   CHOL 175 05/27/2018 11:25 AM   TRIG 100 05/27/2018 11:25 AM   HDL 71 05/27/2018 11:25 AM   CHOLHDL 2.5 05/27/2018 11:25 AM   LDLCALC 84 05/27/2018 11:25 AM    Wt Readings from Last 3 Encounters:  07/14/18 222 lb (100.7 kg)  05/27/18 230 lb (104.3 kg)  05/20/18 234 lb 6.4 oz (106.3 kg)     Objective:    Vital Signs:  Ht 5\' 5"  (1.651 m)   Wt 222 lb (100.7 kg)   BMI 36.94 kg/m    ASSESSMENT & PLAN:    Shortness of breath: much improved with increase exercise conditioning -continue socially-distanced exercise -will hold on  repeating echo for now given improvement in symptoms.  -counseled to call us if her symptoms change  Atrial fib, mechanical aortic valve: rhythm controlled on amiodarone. Coumadin managed by coumadin clinic, plan for drive through INR tomorrow. Recent TFTs, LFTS within her normal range. -no changes to medication today  COVID-19 Education: The signs and symptoms of COVID-19 were discussed with the patient and how to seek care for testing (follow up with PCP or arrange E-visit).  The importance of social distancing was discussed today.  Time:   Today, I have spent 11 minutes with the patient with telehealth technology discussing the above problems.     Medication Adjustments/Labs and Tests Ordered: Current medicines are reviewed at length with the patient today.  Concerns regarding medicines are outlined above.  Tests Ordered: No orders of the defined types were placed in this encounter.  Medication Changes: No orders of the defined types were placed in this encounter.   Disposition:  Follow up in 6 month(s)  Signed, Buford Dresser, MD  07/14/2018 10:58 AM    [facility]

## 2018-07-14 NOTE — Patient Instructions (Signed)

## 2018-07-15 ENCOUNTER — Ambulatory Visit (INDEPENDENT_AMBULATORY_CARE_PROVIDER_SITE_OTHER): Payer: Medicare Other | Admitting: *Deleted

## 2018-07-15 ENCOUNTER — Other Ambulatory Visit: Payer: Self-pay

## 2018-07-15 DIAGNOSIS — Z5181 Encounter for therapeutic drug level monitoring: Secondary | ICD-10-CM

## 2018-07-15 DIAGNOSIS — I4819 Other persistent atrial fibrillation: Secondary | ICD-10-CM

## 2018-07-15 DIAGNOSIS — I480 Paroxysmal atrial fibrillation: Secondary | ICD-10-CM | POA: Diagnosis not present

## 2018-07-15 LAB — POCT INR: INR: 2.7 (ref 2.0–3.0)

## 2018-07-17 ENCOUNTER — Other Ambulatory Visit: Payer: Self-pay | Admitting: Cardiology

## 2018-07-23 NOTE — Patient Instructions (Signed)
Description    Today take 2mg , then continue taking 4mg  daily. Recheck  INR in 4 weeks

## 2018-08-09 ENCOUNTER — Other Ambulatory Visit: Payer: Self-pay | Admitting: Cardiology

## 2018-08-09 NOTE — Telephone Encounter (Signed)
Diltiazem XR 240 mg refilled.

## 2018-08-11 ENCOUNTER — Telehealth: Payer: Self-pay

## 2018-08-11 NOTE — Telephone Encounter (Signed)

## 2018-08-12 ENCOUNTER — Other Ambulatory Visit: Payer: Self-pay

## 2018-08-12 ENCOUNTER — Ambulatory Visit (INDEPENDENT_AMBULATORY_CARE_PROVIDER_SITE_OTHER): Payer: Medicare Other | Admitting: *Deleted

## 2018-08-12 DIAGNOSIS — Z5181 Encounter for therapeutic drug level monitoring: Secondary | ICD-10-CM | POA: Diagnosis not present

## 2018-08-12 DIAGNOSIS — I4819 Other persistent atrial fibrillation: Secondary | ICD-10-CM

## 2018-08-12 DIAGNOSIS — I480 Paroxysmal atrial fibrillation: Secondary | ICD-10-CM

## 2018-08-12 LAB — POCT INR: INR: 2.5 (ref 2.0–3.0)

## 2018-08-29 DIAGNOSIS — L03119 Cellulitis of unspecified part of limb: Secondary | ICD-10-CM | POA: Diagnosis not present

## 2018-09-04 ENCOUNTER — Telehealth: Payer: Self-pay | Admitting: Pharmacist

## 2018-09-04 DIAGNOSIS — L570 Actinic keratosis: Secondary | ICD-10-CM | POA: Diagnosis not present

## 2018-09-04 DIAGNOSIS — I8312 Varicose veins of left lower extremity with inflammation: Secondary | ICD-10-CM | POA: Diagnosis not present

## 2018-09-04 DIAGNOSIS — L821 Other seborrheic keratosis: Secondary | ICD-10-CM | POA: Diagnosis not present

## 2018-09-04 DIAGNOSIS — Z85828 Personal history of other malignant neoplasm of skin: Secondary | ICD-10-CM | POA: Diagnosis not present

## 2018-09-04 DIAGNOSIS — B351 Tinea unguium: Secondary | ICD-10-CM | POA: Diagnosis not present

## 2018-09-04 DIAGNOSIS — D1801 Hemangioma of skin and subcutaneous tissue: Secondary | ICD-10-CM | POA: Diagnosis not present

## 2018-09-04 DIAGNOSIS — I8311 Varicose veins of right lower extremity with inflammation: Secondary | ICD-10-CM | POA: Diagnosis not present

## 2018-09-04 DIAGNOSIS — L814 Other melanin hyperpigmentation: Secondary | ICD-10-CM | POA: Diagnosis not present

## 2018-09-04 DIAGNOSIS — I872 Venous insufficiency (chronic) (peripheral): Secondary | ICD-10-CM | POA: Diagnosis not present

## 2018-09-04 NOTE — Telephone Encounter (Signed)
Patient called to make aware that upcoming appointment will be in clinic. LVM with call back instructions.

## 2018-09-08 ENCOUNTER — Telehealth: Payer: Self-pay

## 2018-09-08 NOTE — Telephone Encounter (Signed)
lmom for prescreen in office aware 

## 2018-09-09 ENCOUNTER — Other Ambulatory Visit: Payer: Self-pay

## 2018-09-09 ENCOUNTER — Ambulatory Visit (INDEPENDENT_AMBULATORY_CARE_PROVIDER_SITE_OTHER): Payer: Medicare Other

## 2018-09-09 DIAGNOSIS — I4819 Other persistent atrial fibrillation: Secondary | ICD-10-CM

## 2018-09-09 DIAGNOSIS — I480 Paroxysmal atrial fibrillation: Secondary | ICD-10-CM | POA: Diagnosis not present

## 2018-09-09 DIAGNOSIS — Z5181 Encounter for therapeutic drug level monitoring: Secondary | ICD-10-CM | POA: Diagnosis not present

## 2018-09-09 LAB — POCT INR: INR: 2.6 (ref 2.0–3.0)

## 2018-09-09 NOTE — Patient Instructions (Signed)
Description   Continue on same dosage 4mg  daily. Recheck INR in 4 weeks

## 2018-09-16 ENCOUNTER — Other Ambulatory Visit: Payer: Self-pay | Admitting: Cardiology

## 2018-09-16 NOTE — Telephone Encounter (Signed)
Dr. Harrell Gave sees this pt now. Please address

## 2018-09-18 ENCOUNTER — Telehealth: Payer: Self-pay | Admitting: Cardiology

## 2018-09-18 NOTE — Telephone Encounter (Signed)
Hi [name], can we get [name] a virtual visit, maybe next week? Thank you!

## 2018-09-18 NOTE — Telephone Encounter (Signed)
Pt scheduled for virtual visit on 6/16 at 10 am.     Virtual Visit Pre-Appointment Phone Call  "(Name), I am calling you today to discuss your upcoming appointment. We are currently trying to limit exposure to the virus that causes COVID-19 by seeing patients at home rather than in the office."  1. "What is the BEST phone number to call the day of the visit?" - include this in appointment notes  2. "Do you have or have access to (through a family member/friend) a smartphone with video capability that we can use for your visit?" a. If yes - list this number in appt notes as "cell" (if different from BEST phone #) and list the appointment type as a VIDEO visit in appointment notes b. If no - list the appointment type as a PHONE visit in appointment notes  3. Confirm consent - "In the setting of the current Covid19 crisis, you are scheduled for a (phone or video) visit with your provider on (date) at (time).  Just as we do with many in-office visits, in order for you to participate in this visit, we must obtain consent.  If you'd like, I can send this to your mychart (if signed up) or email for you to review.  Otherwise, I can obtain your verbal consent now.  All virtual visits are billed to your insurance company just like a normal visit would be.  By agreeing to a virtual visit, we'd like you to understand that the technology does not allow for your provider to perform an examination, and thus may limit your provider's ability to fully assess your condition. If your provider identifies any concerns that need to be evaluated in person, we will make arrangements to do so.  Finally, though the technology is pretty good, we cannot assure that it will always work on either your or our end, and in the setting of a video visit, we may have to convert it to a phone-only visit.  In either situation, we cannot ensure that we have a secure connection.  Are you willing to proceed?" STAFF: Did the patient verbally  acknowledge consent to telehealth visit? Document YES/NO here: Yes  4. Advise patient to be prepared - "Two hours prior to your appointment, go ahead and check your blood pressure, pulse, oxygen saturation, and your weight (if you have the equipment to check those) and write them all down. When your visit starts, your provider will ask you for this information. If you have an Apple Watch or Kardia device, please plan to have heart rate information ready on the day of your appointment. Please have a pen and paper handy nearby the day of the visit as well."  5. Give patient instructions for MyChart download to smartphone OR Doximity/[email or web address] as below if video visit (depending on what platform provider is using)  6. Inform patient they will receive a phone call 15 minutes prior to their appointment time (may be from unknown caller ID) so they should be prepared to answer    TELEPHONE CALL NOTE  [name] has been deemed a candidate for a follow-up tele-health visit to limit community exposure during the Covid-19 pandemic. I spoke with the patient via phone to ensure availability of phone/video source, confirm preferred email & phone number, and discuss instructions and expectations.  I reminded [name] to be prepared with any vital sign and/or heart rhythm information that could potentially be obtained via home monitoring, at the time of her visit.  I reminded [name] to expect a phone call prior to her visit.  Meredith Crutch, RN 09/18/2018 9:15 AM   INSTRUCTIONS FOR DOWNLOADING THE MYCHART APP TO SMARTPHONE  - The patient must first make sure to have activated MyChart and know their login information - If Apple, go to [employer] and type in MyChart in the search bar and download the app. If Android, ask patient to go to [employer] and type in [place] in the search bar and download the app. The app is free but as with any other app downloads, their phone may  require them to verify saved payment information or Apple/Android password.  - The patient will need to then log into the app with their MyChart username and password, and select [facility] as their healthcare provider to link the account. When it is time for your visit, go to the MyChart app, find appointments, and click Begin Video Visit. Be sure to Select Allow for your device to access the Microphone and Camera for your visit. You will then be connected, and your provider will be with you shortly.  **If they have any issues connecting, or need assistance please contact MyChart service desk [phone])**  **If using a computer, in order to ensure the best quality for their visit they will need to use either of the following Internet Browsers: [employer], or Google Chrome**  IF USING DOXIMITY or DOXY.ME - The patient will receive a link just prior to their visit by text.     FULL LENGTH CONSENT FOR TELE-HEALTH VISIT   I hereby voluntarily request, consent and authorize [facility] and its employed or contracted physicians, physician assistants, nurse practitioners or other licensed health care professionals (the Practitioner), to provide me with telemedicine health care services (the "Services") as deemed necessary by the treating Practitioner. I acknowledge and consent to receive the Services by the Practitioner via telemedicine. I understand that the telemedicine visit will involve communicating with the Practitioner through live audiovisual communication technology and the disclosure of certain medical information by electronic transmission. I acknowledge that I have been given the opportunity to request an in-person assessment or other available alternative prior to the telemedicine visit and am voluntarily participating in the telemedicine visit.  I understand that I have the right to withhold or withdraw my consent to the use of telemedicine in the course of my care at  any time, without affecting my right to future care or treatment, and that the Practitioner or I may terminate the telemedicine visit at any time. I understand that I have the right to inspect all information obtained and/or recorded in the course of the telemedicine visit and may receive copies of available information for a reasonable fee.  I understand that some of the potential risks of receiving the Services via telemedicine include:  Marland Kitchen Delay or interruption in medical evaluation due to technological equipment failure or disruption; . Information transmitted may not be sufficient (e.g. poor resolution of images) to allow for appropriate medical decision making by the Practitioner; and/or  . In rare instances, security protocols could fail, causing a breach of personal health information.  Furthermore, I acknowledge that it is my responsibility to provide information about my medical history, conditions and care that is complete and accurate to the best of my ability. I acknowledge that Practitioner's advice, recommendations, and/or decision may be based on factors not within their control, such as incomplete or inaccurate data provided by me or  distortions of diagnostic images or specimens that may result from electronic transmissions. I understand that the practice of medicine is not an exact science and that Practitioner makes no warranties or guarantees regarding treatment outcomes. I acknowledge that I will receive a copy of this consent concurrently upon execution via email to the email address I last provided but may also request a printed copy by calling the office of [facility] [facility].    I understand that my insurance will be billed for this visit.   I have read or had this consent read to me. . I understand the contents of this consent, which adequately explains the benefits and risks of the Services being provided via telemedicine.  . I have been provided ample opportunity to ask questions  regarding this consent and the Services and have had my questions answered to my satisfaction. . I give my informed consent for the services to be provided through the use of telemedicine in my medical care  By participating in this telemedicine visit I agree to the above.

## 2018-09-19 NOTE — Telephone Encounter (Signed)
smartphone/ consent/ my chart/ pre reg completed °

## 2018-09-23 ENCOUNTER — Telehealth: Payer: Self-pay | Admitting: Radiology

## 2018-09-23 ENCOUNTER — Encounter: Payer: Self-pay | Admitting: Cardiology

## 2018-09-23 ENCOUNTER — Other Ambulatory Visit: Payer: Self-pay | Admitting: Cardiology

## 2018-09-23 ENCOUNTER — Telehealth (INDEPENDENT_AMBULATORY_CARE_PROVIDER_SITE_OTHER): Payer: Medicare Other | Admitting: Cardiology

## 2018-09-23 VITALS — Ht 64.0 in | Wt 220.0 lb

## 2018-09-23 DIAGNOSIS — I480 Paroxysmal atrial fibrillation: Secondary | ICD-10-CM

## 2018-09-23 DIAGNOSIS — R002 Palpitations: Secondary | ICD-10-CM

## 2018-09-23 DIAGNOSIS — Z7189 Other specified counseling: Secondary | ICD-10-CM | POA: Diagnosis not present

## 2018-09-23 NOTE — Patient Instructions (Signed)
Medication Instructions:  Your Physician recommend you continue on your current medication as directed.    If you need a refill on your cardiac medications before your next appointment, please call your pharmacy.   Lab work: None  Testing/Procedures: Our physician has recommended that you wear an Kidron monitor. The Zio patch cardiac monitor continuously records heart rhythm data for up to 14 days, this is for patients being evaluated for multiple types heart rhythms. For the first 24 hours post application, please avoid getting the Zio monitor wet in the shower or by excessive sweating during exercise. After that, feel free to carry on with regular activities. Keep soaps and lotions away from the ZIO XT Patch.   This will be placed at our [facility] location - [address], [address].      Follow-Up: At [facility], you and your health needs are our priority.  As part of our continuing mission to provide you with exceptional heart care, we have created designated Provider Care Teams.  These Care Teams include your primary Cardiologist (physician) and Advanced Practice Providers (APPs -  Physician Assistants and Nurse Practitioners) who all work together to provide you with the care you need, when you need it. You will need a follow up appointment in 1 months.  Please call our office 2 months in advance to schedule this appointment.  You may see Buford Dresser, MD or one of the following Advanced Practice Providers on your designated Care Team:   Rosaria Ferries, PA-C . Jory Sims, DNP, ANP

## 2018-09-23 NOTE — Telephone Encounter (Signed)
Enrolled patient for a 14 Day Zio monitor to be mailed. Brief instructions were gone over with patient and she know to expect the monitor to arrive in 3-4 days

## 2018-09-23 NOTE — Progress Notes (Signed)
Virtual Visit via Video Note   This visit type was conducted due to national recommendations for restrictions regarding the COVID-19 Pandemic (e.g. social distancing) in an effort to limit this patient's exposure and mitigate transmission in our community.  Due to her co-morbid illnesses, this patient is at least at moderate risk for complications without adequate follow up.  This format is felt to be most appropriate for this patient at this time.  All issues noted in this document were discussed and addressed.  A limited physical exam was performed with this format.  Please refer to the patient's chart for her consent to telehealth for [facility].   Date:  09/23/2018   ID:  [name], DOB 11-16-1945, MRN [id number]  Patient Location: Home Provider Location: Home  PCP:  Maurice Small, MD  Cardiologist:  Buford Dresser, MD  Electrophysiologist:  None   Evaluation Performed:  Follow-Up Visit  Chief Complaint:  Concern that she is having intermittent afib  History of Present Illness:    [name] is a 73 y.o. female with a hx of morbid obesity, mechanical AVR in 2006 by Dr. Prescott Gum, paroxysmal atrial fibrillation (see hx below), OSA on CPAP who is seen for an interim follow up visit due to new concerns for afib bursts.  Paroxysmal atrial fibrillation: trialed on tikosyn with DCCV, did not maintain NSR. Amiodarone loaded, then DCCV, but did not maintain NSR, Had afib ablation at [facility], did not maintain, and then had repeat ablation 10/2015 at [facility], placed on amiodarone. Has maintained sinus rhythm predominantly since that time.  The patient does not have symptoms concerning for COVID-19 infection (fever, chills, cough, or new shortness of breath).   Today: Has felt terrible the last week, feels like she is going in and out of afib. Feels well today. When she has the events, she feels tired, short of breath, grouchy. Feels palpitations/fast beats. Very erratic and  unpredictable timing and duration of events. No clear triggers. Can be several days between events or a few days in a row. She has detected them using her pulse ox, which shows her when the beats are irregular.  See above re: history of managing afib. Has been on this dose of amio for about 4 years. Wonders if going up on the dose of amiodarone would help her. We discussed risk/benefit of this.   Denies chest pain, shortness of breath at rest or with normal exertion. No PND, orthopnea, LE edema or unexpected weight gain. No syncope.  Past Medical History:  Diagnosis Date  . Anxiety   . Aortic stenosis    status post aortic valve replacement with St. Jude mechanical prosthesis  . 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   28mm by echo 06/2016 and 48mm by CT angin 2016  . Chronic anticoagulation   . 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 Depression   . DJD (degenerative joint disease) of knee   . Dyslipidemia   . GERD (gastroesophageal reflux disease)    "several years ago; none since" (11/12/2012)  . Hyperlipidemia   . Hyperlipidemia LDL goal <70 01/19/2017  . Hypertension   . Left atrial enlargement   . Mitral regurgitation 06/25/2016   Mild moderate MR by echo 06/2016  . Obesity   . Persistent atrial fibrillation    s/p afib ablation x 2 at [facility]  . Sleep apnea    On CPAP at 12cm H2O  . 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 BURR HOLE FOR SUBDURAL HEMATOMA  2006  . CARDIAC VALVE REPLACEMENT  2006   St. Jude AVR  . CARDIOVERSION N/A 07/22/2012   Procedure: CARDIOVERSION;  Surgeon: [name], MD;  Location: [facility] ENDOSCOPY;  Service: Cardiovascular;  Laterality: N/A;  . CARDIOVERSION N/A 11/25/2012   Procedure: CARDIOVERSION;  Surgeon: [name], MD;  Location: [facility] ENDOSCOPY;  Service:  Cardiovascular;  Laterality: N/A;  . CARDIOVERSION N/A 12/30/2012   Procedure: CARDIOVERSION;  Surgeon: [name], MD;  Location: [facility] ENDOSCOPY;  Service: Cardiovascular;  Laterality: N/A;  h&p in file-HW   . CARDIOVERSION N/A 03/30/2013   Procedure: CARDIOVERSION;  Surgeon: [name], MD;  Location: [facility] ENDOSCOPY;  Service: Cardiovascular;  Laterality: N/A;  . ELECTROPHYSIOLOGIC STUDY  11/2013   Afib ablation x 2 (11/2013 and 10/2015) at [facility] by Dr Clyda Hurdle with recurrence post ablation  . KNEE ARTHROSCOPY Left 1980's   "2" (11/12/2012)  . KNEE SURGERY Left 1980's   "after 2 scopes they went in and did some kind of OR" (11/12/2012)  . TEE WITHOUT CARDIOVERSION N/A 07/22/2012   Procedure: TRANSESOPHAGEAL ECHOCARDIOGRAM (TEE);  Surgeon: [name], MD;  Location: [facility] ENDOSCOPY;  Service: Cardiovascular;  Laterality: N/A;  Rm 2034  . TEE WITHOUT CARDIOVERSION N/A 10/07/2013   Procedure: TRANSESOPHAGEAL ECHOCARDIOGRAM (TEE);  Surgeon: [name], MD;  Location: [facility] ENDOSCOPY;  Service: Cardiovascular;  Laterality: N/A;  . TOTAL KNEE ARTHROPLASTY Left 11/05/2014   Procedure: TOTAL KNEE ARTHROPLASTY;  Surgeon: Dorna Leitz, MD;  Location: [place];  Service: Orthopedics;  Laterality: Left;  . TUBAL LIGATION  1980's     Current Meds  Medication Sig  . ALPRAZolam (XANAX) 0.5 MG tablet Take 0.5 mg by mouth 2 (two) times daily as needed. AS NEEDED FOR ANXIETY  . amiodarone (PACERONE) 200 MG tablet TAKE 1 TABLET BY MOUTH DAILY  . Ascorbic Acid (VITAMIN C) 1000 MG tablet Take 2,000 mg by mouth daily.  . calcium-vitamin D (OSCAL WITH D) 500-200 MG-UNIT per tablet Take 2 tablets by mouth daily.   . Cholecalciferol (VITAMIN D) 2000 UNITS tablet Take 4,000 Units by mouth daily.  Marland Kitchen DILT-XR 240 MG 24 hr capsule TAKE 1 CAPSULE BY MOUTH EVERY MORNING ON AN EMPTY STOMACH  . DULoxetine (CYMBALTA) 60 MG capsule Take 60 mg by mouth daily.  Marland Kitchen levothyroxine (SYNTHROID, LEVOTHROID) 100 MCG tablet Take 100 mcg daily before  breakfast by mouth.  . losartan (COZAAR) 100 MG tablet Take 1 tablet (100 mg total) by mouth daily.  . Multiple Vitamin (MULTIVITAMIN WITH MINERALS) TABS Take 1 tablet by mouth daily.  . potassium chloride SA (K-DUR,KLOR-CON) 20 MEQ tablet TAKE 2 TABLETS BY MOUTH EVERY MORNING AND 1 TABLET EVERY EVENING  . pravastatin (PRAVACHOL) 20 MG tablet TAKE 1 TABLET BY MOUTH EVERY DAY  . tretinoin (RETIN-A) 0.05 % cream Apply 1 application topically at bedtime.   Marland Kitchen warfarin (COUMADIN) 4 MG tablet TAKE 1/2 TO 1 TABLET BY MOUTH DAILY AS DIRECTED BY COUMADIN CLINIC  . zolpidem (AMBIEN) 10 MG tablet Take 5 mg by mouth at bedtime as needed for sleep.  . [DISCONTINUED] furosemide (LASIX) 80 MG tablet TAKE 1 TABLET BY MOUTH DAILY, KEEP UPCOMING APPT IN FEBRUARY FOR FUTURE REFILLS     Allergies:   Codeine, Beta adrenergic blockers, Metoprolol, Propoxyphene, Toprol xl [metoprolol tartrate], and Dilantin [  phenytoin sodium extended]   Social History   Tobacco Use  . Smoking status: Never Smoker  . Smokeless tobacco: Never Used  Substance Use Topics  . Alcohol use: No    Comment: 11/12/2012 "no alcohol in the last 9 years"  . Drug use: No     Family Hx: The patient's family history includes Heart disease in her brother; Hyperlipidemia in her father; Hypertension in her father and mother; Lung cancer in her mother.  ROS:   Please see the history of present illness.    Constitutional: Negative for chills, fever, night sweats, unintentional weight loss  HENT: Negative for ear pain and hearing loss.   Eyes: Negative for loss of vision and eye pain.  Respiratory: Negative for cough, sputum, wheezing.   Cardiovascular: See HPI. Gastrointestinal: Negative for abdominal pain, melena, and hematochezia.  Genitourinary: Negative for dysuria and hematuria.  Musculoskeletal: Negative for falls and myalgias.  Skin: Negative for itching and rash.  Neurological: Negative for focal weakness, focal sensory changes and  loss of consciousness.  Endo/Heme/Allergies: Does not bruise/bleed easily.  All other systems reviewed and are negative.   Prior CV studies:   The following studies were reviewed today: Prior echos, stress tests, cardioversions and ablations reviewed.  Labs/Other Tests and Data Reviewed:    EKG:  An ECG dated 05/27/18 was personally reviewed today and demonstrated:  SR, RBBB, PVC  Recent Labs: 05/27/2018: ALT 13; BUN 13; Creatinine, Ser 0.85; Potassium 4.3; Sodium 140; TSH 4.560   Recent Lipid Panel Lab Results  Component Value Date/Time   CHOL 175 05/27/2018 11:25 AM   TRIG 100 05/27/2018 11:25 AM   HDL 71 05/27/2018 11:25 AM   CHOLHDL 2.5 05/27/2018 11:25 AM   LDLCALC 84 05/27/2018 11:25 AM    Wt Readings from Last 3 Encounters:  09/23/18 220 lb (99.8 kg)  07/14/18 222 lb (100.7 kg)  05/27/18 230 lb (104.3 kg)     Objective:    Vital Signs:  Ht 5\' 4"  (1.626 m)   Wt 220 lb (99.8 kg)   BMI 37.76 kg/m    VITAL SIGNS:  reviewed GEN:  no acute distress EYES:  sclerae anicteric, EOMI - Extraocular Movements Intact RESPIRATORY:  normal respiratory effort, symmetric expansion CARDIOVASCULAR:  no visible JVD SKIN:  no rash, lesions or ulcers. MUSCULOSKELETAL:  no obvious deformities. NEURO:  alert and oriented x 3, no obvious focal deficit PSYCH:  normal affect  ASSESSMENT & PLAN:    Palpitations in the setting of history of paroxysmal atrial fib: suspect based on symptoms, pulse ox pattern that this is paroxysmal atrial fib. However, she has been well controlled on amiodarone. She also had PVCs noted on prior ECG. It would be helpful to know exactly what rhythm she is in with her symptoms, and if it is afib, overall burden, time of day, etc would be helpful in determining changes to therapy. -ordered Zio patch today  COVID-19 Education: The signs and symptoms of COVID-19 were discussed with the patient and how to seek care for testing (follow up with PCP or arrange  E-visit).  The importance of social distancing was discussed today.  Time:   Today, I have spent 18 minutes with the patient with telehealth technology discussing the above problems.    Patient Instructions  Medication Instructions:  Your Physician recommend you continue on your current medication as directed.    If you need a refill on your cardiac medications before your next appointment, please call your pharmacy.  Lab work: None  Testing/Procedures: Our physician has recommended that you wear an [place] monitor. The Zio patch cardiac monitor continuously records heart rhythm data for up to 14 days, this is for patients being evaluated for multiple types heart rhythms. For the first 24 hours post application, please avoid getting the Zio monitor wet in the shower or by excessive sweating during exercise. After that, feel free to carry on with regular activities. Keep soaps and lotions away from the ZIO XT Patch.   This will be placed at our [facility] location - [address], [address].      Follow-Up: At [facility], you and your health needs are our priority.  As part of our continuing mission to provide you with exceptional heart care, we have created designated Provider Care Teams.  These Care Teams include your primary Cardiologist (physician) and Advanced Practice Providers (APPs -  Physician Assistants and Nurse Practitioners) who all work together to provide you with the care you need, when you need it. You will need a follow up appointment in 1 months.  Please call our office 2 months in advance to schedule this appointment.  You may see Buford Dresser, MD or one of the following Advanced Practice Providers on your designated Care Team:   Rosaria Ferries, PA-C . Jory Sims, DNP, ANP       Medication Adjustments/Labs and Tests Ordered: Current medicines are reviewed at length with the patient today.  Concerns regarding medicines are outlined  above.   Tests Ordered: Orders Placed This Encounter  Procedures  . LONG TERM MONITOR (3-14 DAYS)    Medication Changes: No orders of the defined types were placed in this encounter.  Follow Up:  1 mos to review monitor and symptoms, discuss next steps  Signed, Buford Dresser, MD  09/23/2018 11:10 PM    [facility]

## 2018-09-27 ENCOUNTER — Ambulatory Visit (INDEPENDENT_AMBULATORY_CARE_PROVIDER_SITE_OTHER): Payer: Medicare Other

## 2018-09-27 DIAGNOSIS — I480 Paroxysmal atrial fibrillation: Secondary | ICD-10-CM | POA: Diagnosis not present

## 2018-09-30 ENCOUNTER — Telehealth: Payer: Self-pay

## 2018-09-30 NOTE — Telephone Encounter (Signed)
lmom for prescreen  

## 2018-10-07 ENCOUNTER — Ambulatory Visit (INDEPENDENT_AMBULATORY_CARE_PROVIDER_SITE_OTHER): Payer: Medicare Other | Admitting: *Deleted

## 2018-10-07 ENCOUNTER — Other Ambulatory Visit: Payer: Self-pay

## 2018-10-07 DIAGNOSIS — Z5181 Encounter for therapeutic drug level monitoring: Secondary | ICD-10-CM

## 2018-10-07 DIAGNOSIS — I480 Paroxysmal atrial fibrillation: Secondary | ICD-10-CM | POA: Diagnosis not present

## 2018-10-07 DIAGNOSIS — I4819 Other persistent atrial fibrillation: Secondary | ICD-10-CM

## 2018-10-07 LAB — POCT INR: INR: 2.7 (ref 2.0–3.0)

## 2018-10-07 NOTE — Patient Instructions (Signed)
Description   Today take 2mg  (1/2 tablet) then start taking 4mg  daily except 2mg  on Sundays. Recheck INR in 3 weeks.

## 2018-10-17 DIAGNOSIS — I480 Paroxysmal atrial fibrillation: Secondary | ICD-10-CM | POA: Diagnosis not present

## 2018-10-21 ENCOUNTER — Telehealth: Payer: Self-pay | Admitting: Internal Medicine

## 2018-10-21 NOTE — Telephone Encounter (Signed)
LMTCB re: inhaler reflill request.

## 2018-10-22 MED ORDER — BUDESONIDE-FORMOTEROL FUMARATE 80-4.5 MCG/ACT IN AERO: 2.0000 | INHALATION_SPRAY | Freq: Two times a day (BID) | RESPIRATORY_TRACT | 3 refills | Status: DC

## 2018-10-22 NOTE — Telephone Encounter (Signed)
Refill x 3 m then ov before more

## 2018-10-22 NOTE — Telephone Encounter (Signed)
I attempted to call pt to let her know her Symbicort 80 x3 refills will be sent to her preferred pharmacy and to let her know to make a f/u appointment for further refills. At the time of the call, the line went to voicemail. I have left a message for pt to call back.   Order for Symbicort 80 2 puffs twice daily is placed x3 with a note to the pharmacy. Will keep this encounter open to f/u on should pt call back.

## 2018-10-22 NOTE — Telephone Encounter (Signed)
Instructions from last OV 04/25/17  Monitor your 02 saturation toward the end of your exertion  Work on inhaler technique:  relax and gently blow all the way out then take a nice smooth deep breath back in, triggering the inhaler at same time you start breathing in.  Hold for up to 5 seconds if you can. Blow out thru nose. Rinse and gargle with water when done  Take the dulera 100 = symb 80 Take 2 puffs first thing in am and then another 2 puffs about 12 hours later and they try off to see if any difference in either your cough or breathing and if either is worse start back on it   Please schedule a follow up visit in 6 months but call sooner if needed  with all medications /inhalers/ solutions in hand so we can verify exactly what you are taking. This includes all medications from all doctors and over the counters     Called and spoke with pt. Pt is wanting to know if she can have Rx for Symbicort 80 sent to pharmacy. Dr.Wert, please advise if you are okay with this. Thanks!

## 2018-10-23 ENCOUNTER — Other Ambulatory Visit: Payer: Self-pay

## 2018-10-23 NOTE — Telephone Encounter (Signed)
Called and spoke with pt letting her know that a refill of symb was sent to pharmacy for her. Stated to her before her running out of the med to call office to get an appt scheduled with MW. Pt expressed understanding. Nothing further needed.

## 2018-10-24 ENCOUNTER — Telehealth: Payer: Self-pay | Admitting: Cardiology

## 2018-10-24 NOTE — Telephone Encounter (Signed)
Called patient. She is anxious about her monitor results. It looks as if they are back but haven't been read. I will forward to Dr. Harrell Gave.

## 2018-10-24 NOTE — Telephone Encounter (Signed)
New message:     Patient call concerning some results. Please call patient.

## 2018-10-27 ENCOUNTER — Telehealth: Payer: Self-pay

## 2018-10-27 NOTE — Telephone Encounter (Signed)
lmom for prescreen  

## 2018-10-28 ENCOUNTER — Ambulatory Visit (INDEPENDENT_AMBULATORY_CARE_PROVIDER_SITE_OTHER): Payer: Medicare Other | Admitting: *Deleted

## 2018-10-28 ENCOUNTER — Other Ambulatory Visit: Payer: Self-pay

## 2018-10-28 DIAGNOSIS — Z5181 Encounter for therapeutic drug level monitoring: Secondary | ICD-10-CM | POA: Diagnosis not present

## 2018-10-28 DIAGNOSIS — I4819 Other persistent atrial fibrillation: Secondary | ICD-10-CM

## 2018-10-28 LAB — POCT INR: INR: 2.4 (ref 2.0–3.0)

## 2018-10-28 NOTE — Patient Instructions (Addendum)
Description   Continue taking 4mg  daily except 2mg  on Sundays. Recheck INR in 4 weeks. Coumadin Clinic [phone]

## 2018-10-30 NOTE — Progress Notes (Signed)
Monitor shows that she is in afib 100% of the time with occasional PVCs as well. There were also 10 brief episodes that appear to be NSVT (all very short, 9 beats or less). If she is feeling poorly, we can set her up for cardioversion. We need 3 weekly INRs >2 to be able to do cardioversion alone. She has already had one this week. We can either arrange this, or we can set her up with afib clinic to do this. I am doing cardioversions on 8/7 if she would like to have it done then (she will need a ride). She would just need an INR next week and the week after. Thanks!

## 2018-10-30 NOTE — Telephone Encounter (Signed)
I read today, sending separate result note. Thanks.

## 2018-10-31 ENCOUNTER — Telehealth: Payer: Self-pay

## 2018-10-31 NOTE — Telephone Encounter (Signed)
Pt updated and voiced understanding.  

## 2018-10-31 NOTE — Telephone Encounter (Signed)
   Will route to MD to verify if pt is ok for office visit due to symptoms listed below.   Pt have been made aware of visitor's policy and to wear a mask.   COVID-19 Pre-Screening Questions:  . In the past 7 to 10 days have you had a cough,  shortness of breath, headache, congestion, fever (100 or greater) body aches, chills, sore throat, or sudden loss of taste or sense of smell? Little dry cough for about 3 months now just in the morning. Pt also has SOB but think it could be related to Afib. . Have you been around anyone with known Covid 19.No . Have you been around anyone who is awaiting Covid 19 test results in the past 7 to 10 days? No . Have you been around anyone who has been exposed to Covid 19, or has mentioned symptoms of Covid 19 within the past 7 to 10 days? No  If you have any concerns/questions about symptoms patients report during screening (either on the phone or at threshold). Contact the provider seeing the patient or DOD for further guidance.  If neither are available contact a member of the leadership team.

## 2018-10-31 NOTE — Telephone Encounter (Signed)
[place] for in person visit. Thanks.

## 2018-11-04 ENCOUNTER — Telehealth: Payer: Self-pay | Admitting: Cardiology

## 2018-11-04 ENCOUNTER — Ambulatory Visit (INDEPENDENT_AMBULATORY_CARE_PROVIDER_SITE_OTHER): Payer: Medicare Other | Admitting: Cardiology

## 2018-11-04 ENCOUNTER — Other Ambulatory Visit: Payer: Self-pay

## 2018-11-04 VITALS — BP 126/66 | HR 88 | Ht 64.0 in | Wt 233.8 lb

## 2018-11-04 DIAGNOSIS — I10 Essential (primary) hypertension: Secondary | ICD-10-CM | POA: Diagnosis not present

## 2018-11-04 DIAGNOSIS — I77810 Thoracic aortic ectasia: Secondary | ICD-10-CM

## 2018-11-04 DIAGNOSIS — Z7189 Other specified counseling: Secondary | ICD-10-CM

## 2018-11-04 DIAGNOSIS — I4819 Other persistent atrial fibrillation: Secondary | ICD-10-CM | POA: Diagnosis not present

## 2018-11-04 DIAGNOSIS — R0602 Shortness of breath: Secondary | ICD-10-CM | POA: Diagnosis not present

## 2018-11-04 DIAGNOSIS — R5383 Other fatigue: Secondary | ICD-10-CM | POA: Diagnosis not present

## 2018-11-04 DIAGNOSIS — I480 Paroxysmal atrial fibrillation: Secondary | ICD-10-CM | POA: Diagnosis not present

## 2018-11-04 DIAGNOSIS — R002 Palpitations: Secondary | ICD-10-CM | POA: Diagnosis not present

## 2018-11-04 DIAGNOSIS — I493 Ventricular premature depolarization: Secondary | ICD-10-CM | POA: Diagnosis not present

## 2018-11-04 DIAGNOSIS — I5032 Chronic diastolic (congestive) heart failure: Secondary | ICD-10-CM

## 2018-11-04 DIAGNOSIS — Z952 Presence of prosthetic heart valve: Secondary | ICD-10-CM

## 2018-11-04 NOTE — Telephone Encounter (Signed)

## 2018-11-04 NOTE — Progress Notes (Signed)
Cardiology Office Note:    Date:  11/07/2018   ID:  [name], DOB 25-Jul-1945, MRN [id number]  PCP:  Maurice Small, MD  Cardiologist:  Buford Dresser, MD PhD  Referring MD: Maurice Small, MD   CC: follow up monitor  History of Present Illness:    [name] is a 73 y.o. female with a hx ofhx of morbid obesity, mechanical AVR in 2006 by Dr. Prescott Gum, paroxysmal atrial fibrillation (see hx below), OSA on CPAPwho is seen for follow up.  Reviewed results of monitor. It is a bit strange--it reads as 100% afib, and I looked at the tracings. I cannot clearly see p waves, which is consistent with afib, but the rate is extremely regular. The trend is almost 100% 90 bpm with some fluctuations. She was confused by the results as she usually can feel her afib, and it has not been what she is feeling. We performed multiple ECGs and rhythm strips in the office today to attempt to better understand her rhythm. She again shows a very steady rate of 89-90bpm, wide complex in a RBBB pattern. It is very difficult to discern whether this is a long 1st degree AV block vs. An accelerated junctional rhythm. There is one section post PVC where it appears there is a small P wave, but the morphology is the same as prior ECGs. I showed it to one of my colleagues as well, who also thinks it may be sinus with 1st degree AV block. We both agreed that the regularity on the strips is not consistent with afib.  Np BP cuff at home. Denies chest pain, shortness of breath at rest, but does get SOB more easily than before. No PND, orthopnea, LE edema or unexpected weight gain. No syncope or palpitations. Just generally feels tired, cannot do as much as she has before.   She continues to feel worse over time. She is concerned that if it isn't afib, what else might be causing her symptoms. We discussed multiple etiologies for this and how we will plan to evaluate.  Past Medical History:  Diagnosis Date  .  Anxiety   . Aortic stenosis    status post aortic valve replacement with St. Jude mechanical prosthesis  . 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   55mm by echo 06/2016 and 32mm by CT angin 2016  . Chronic anticoagulation   . 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 Depression   . DJD (degenerative joint disease) of knee   . Dyslipidemia   . GERD (gastroesophageal reflux disease)    "several years ago; none since" (11/12/2012)  . Hyperlipidemia   . Hyperlipidemia LDL goal <70 01/19/2017  . Hypertension   . Left atrial enlargement   . Mitral regurgitation 06/25/2016   Mild moderate MR by echo 06/2016  . Obesity   . Persistent atrial fibrillation    s/p afib ablation x 2 at [facility]  . Sleep apnea    On CPAP at 12cm H2O  . 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 - now cleared by neurosurgery to maintain INR 2-2.5    Past Surgical History:  Procedure Laterality Date  . BURR HOLE FOR SUBDURAL HEMATOMA  2006  . CARDIAC VALVE REPLACEMENT  2006   St. Jude AVR  . CARDIOVERSION N/A 07/22/2012   Procedure: CARDIOVERSION;  Surgeon: [name], MD;  Location: [facility];  Service: Cardiovascular;  Laterality: N/A;  . CARDIOVERSION N/A 11/25/2012   Procedure: CARDIOVERSION;  Surgeon: [name], MD;  Location: [facility] ENDOSCOPY;  Service: Cardiovascular;  Laterality: N/A;  . CARDIOVERSION N/A 12/30/2012   Procedure: CARDIOVERSION;  Surgeon: [name], MD;  Location: [facility] ENDOSCOPY;  Service: Cardiovascular;  Laterality: N/A;  h&p in file-HW   . CARDIOVERSION N/A 03/30/2013   Procedure: CARDIOVERSION;  Surgeon: [name], MD;  Location: [facility] ENDOSCOPY;  Service: Cardiovascular;  Laterality: N/A;  . ELECTROPHYSIOLOGIC STUDY  11/2013   Afib ablation x 2 (11/2013 and 10/2015) at [facility] by Dr Clyda Hurdle with recurrence post ablation  . KNEE ARTHROSCOPY Left 1980's   "2" (11/12/2012)  . KNEE SURGERY Left 1980's   "after 2 scopes  they went in and did some kind of OR" (11/12/2012)  . TEE WITHOUT CARDIOVERSION N/A 07/22/2012   Procedure: TRANSESOPHAGEAL ECHOCARDIOGRAM (TEE);  Surgeon: [name], MD;  Location: [facility] ENDOSCOPY;  Service: Cardiovascular;  Laterality: N/A;  Rm 2034  . TEE WITHOUT CARDIOVERSION N/A 10/07/2013   Procedure: TRANSESOPHAGEAL ECHOCARDIOGRAM (TEE);  Surgeon: [name], MD;  Location: [facility] ENDOSCOPY;  Service: Cardiovascular;  Laterality: N/A;  . TOTAL KNEE ARTHROPLASTY Left 11/05/2014   Procedure: TOTAL KNEE ARTHROPLASTY;  Surgeon: Dorna Leitz, MD;  Location: [place];  Service: Orthopedics;  Laterality: Left;  . TUBAL LIGATION  1980's    Current Medications: Current Outpatient Medications on File Prior to Visit  Medication Sig  . ALPRAZolam (XANAX) 0.5 MG tablet Take 0.5 mg by mouth 2 (two) times daily as needed. AS NEEDED FOR ANXIETY  . amiodarone (PACERONE) 200 MG tablet TAKE 1 TABLET BY MOUTH DAILY  . Ascorbic Acid (VITAMIN C) 1000 MG tablet Take 2,000 mg by mouth daily.  . budesonide-formoterol (SYMBICORT) 80-4.5 MCG/ACT inhaler Inhale 2 puffs into the lungs 2 (two) times a day.  . calcium-vitamin D (OSCAL WITH D) 500-200 MG-UNIT per tablet Take 2 tablets by mouth daily.   . Cholecalciferol (VITAMIN D) 2000 UNITS tablet Take 4,000 Units by mouth daily.  Marland Kitchen DILT-XR 240 MG 24 hr capsule TAKE 1 CAPSULE BY MOUTH EVERY MORNING ON AN EMPTY STOMACH  . DULoxetine (CYMBALTA) 60 MG capsule Take 60 mg by mouth daily.  . furosemide (LASIX) 80 MG tablet TAKE 1 TABLET BY MOUTH EVERY DAY  . levothyroxine (SYNTHROID, LEVOTHROID) 100 MCG tablet Take 100 mcg daily before breakfast by mouth.  . losartan (COZAAR) 100 MG tablet Take 1 tablet (100 mg total) by mouth daily.  . Multiple Vitamin (MULTIVITAMIN WITH MINERALS) TABS Take 1 tablet by mouth daily.  . potassium chloride SA (K-DUR,KLOR-CON) 20 MEQ tablet TAKE 2 TABLETS BY MOUTH EVERY MORNING AND 1 TABLET EVERY EVENING  . pravastatin (PRAVACHOL) 20 MG tablet TAKE 1  TABLET BY MOUTH EVERY DAY  . tretinoin (RETIN-A) 0.05 % cream Apply 1 application topically at bedtime.   Marland Kitchen warfarin (COUMADIN) 4 MG tablet TAKE 1/2 TO 1 TABLET BY MOUTH DAILY AS DIRECTED BY COUMADIN CLINIC  . zolpidem (AMBIEN) 10 MG tablet Take 5 mg by mouth at bedtime as needed for sleep.   No current facility-administered medications on file prior to visit.      Allergies:   Codeine, Beta adrenergic blockers, Metoprolol, Propoxyphene, Toprol xl [metoprolol tartrate], and Dilantin [phenytoin sodium extended]   Social History   Socioeconomic History  . Marital status: Married    Spouse name: Not on file  . Number of children: Not on file  . Years of education: Not on file  .  Highest education level: Not on file  Occupational History  . Not on file  Social Needs  . Financial resource strain: Not on file  . Food insecurity    Worry: Not on file    Inability: Not on file  . Transportation needs    Medical: Not on file    Non-medical: Not on file  Tobacco Use  . Smoking status: Never Smoker  . Smokeless tobacco: Never Used  Substance and Sexual Activity  . Alcohol use: No    Comment: 11/12/2012 "no alcohol in the last 9 years"  . Drug use: No  . Sexual activity: Not Currently  Lifestyle  . Physical activity    Days per week: Not on file    Minutes per session: Not on file  . Stress: Not on file  Relationships  . Social Herbalist on phone: Not on file    Gets together: Not on file    Attends religious service: Not on file    Active member of club or organization: Not on file    Attends meetings of clubs or organizations: Not on file    Relationship status: Not on file  Other Topics Concern  . Not on file  Social History Narrative   Pt lives in [place] with spouse.  Retired     Family History: The patient's family history includes Heart disease in her brother; Hyperlipidemia in her father; Hypertension in her father and mother; Lung cancer in her mother.   ROS:   Please see the history of present illness.  Additional pertinent ROS:  Constitutional: Negative for chills, fever, night sweats, unintentional weight loss  HENT: Negative for ear pain and hearing loss.   Eyes: Negative for loss of vision and eye pain.  Respiratory: Negative for cough, sputum, wheezing.   Cardiovascular: See HPI. Gastrointestinal: Negative for abdominal pain, melena, and hematochezia.  Genitourinary: Negative for dysuria and hematuria.  Musculoskeletal: Negative for falls and myalgias.  Skin: Negative for itching and rash.  Neurological: Negative for focal weakness, focal sensory changes and loss of consciousness.  Endo/Heme/Allergies: Does not bruise/bleed easily.    EKGs/Labs/Other Studies Reviewed:    The following studies were reviewed today: Monitor results, prior echoes  EKG:  EKG is personally reviewed.  The ekg ordered today demonstrates regular, wide complex (RBBB) rhythm at 89 BP. One strip with PVC. 1st degree AV block vs. Accelerated junctional rhythm  Recent Labs: 05/27/2018: ALT 13; BUN 13; Creatinine, Ser 0.85; Potassium 4.3; Sodium 140; TSH 4.560  Recent Lipid Panel    Component Value Date/Time   CHOL 175 05/27/2018 1125   TRIG 100 05/27/2018 1125   HDL 71 05/27/2018 1125   CHOLHDL 2.5 05/27/2018 1125   LDLCALC 84 05/27/2018 1125    Physical Exam:    VS:  BP 126/66   Pulse 88   Ht 5\' 4"  (1.626 m)   Wt 233 lb 12.8 oz (106.1 kg)   BMI 40.13 kg/m     Wt Readings from Last 3 Encounters:  11/04/18 233 lb 12.8 oz (106.1 kg)  09/23/18 220 lb (99.8 kg)  07/14/18 222 lb (100.7 kg)    GEN: Well nourished, well developed in no acute distress HEENT: Normal NECK: No JVD; No carotid bruits LYMPHATICS: No lymphadenopathy CARDIAC: regular rhythm, normal S1 and S2, no murmurs, rubs, gallops. Crisp metallic closure of aortic valve. Radial and DP pulses 2+ bilaterally. RESPIRATORY:  Clear to auscultation without rales, wheezing or rhonchi   ABDOMEN: Soft,  non-tender, non-distended MUSCULOSKELETAL:  No edema; No deformity  SKIN: Warm and dry NEUROLOGIC:  Alert and oriented x 3 PSYCHIATRIC:  Normal affect   ASSESSMENT:    1. SOB (shortness of breath)   2. Fatigue, unspecified type   3. PVC (premature ventricular contraction)   4. Paroxysmal atrial fibrillation (HCC)   5. Ascending aorta dilatation (HCC)   6. S/P AVR (aortic valve replacement)   7. Essential hypertension   8. Cardiac risk counseling   9. Counseling on health promotion and disease prevention    PLAN:    Shortness of breath, fatigue: appears euvolemic. Monitor without clear p waves, noted as afib 100% of the time. However, very regular. Same pattern seen on ECG and rhythm strip today. Discussed with my colleague as well. Likely 1st degree AV block with RBBB, but I personally cannot exclude accelerated junctional rhythm -no cardioversion -check echo  Paroxysmal atrial fibrillation: on coumadin for mechanical valve. Chadsvasc=4 -has extensive treatment history, see prior notes.  -on amiodarone long term -checked TSH, LFTs at prior visit -if echo unchanged, would check PFTs  Mechanical aortic valve -follows with coumadin clinic -checking gradient on echo today  Hypertension: at goal today -continue diltiazem 240 mg daily, furosemide 80 mg daily, losartan 100 mg  Stable ascending aortic aneurysm: 4.4 2018-2019.  OSA: continue CPAP  Cardiac risk counseling and prevention recommendations: -recommend heart healthy/Mediterranean diet, with whole grains, fruits, vegetable, fish, lean meats, nuts, and olive oil. Limit salt. -recommend moderate walking, 3-5 times/week for 30-50 minutes each session. Aim for at least 150 minutes.week. Goal should be pace of 3 miles/hours, or walking 1.5 miles in 30 minutes -recommend avoidance of tobacco products. Avoid excess alcohol. -ASCVD risk score: The 10-year ASCVD risk score Mikey Bussing DC Brooke Bonito., et al., 2013) is: 16.1%    Values used to calculate the score:     Age: 62 years     Sex: Female     Is Non-Hispanic African American: No     Diabetic: No     Tobacco smoker: No     Systolic Blood Pressure: 496 mmHg     Is BP treated: Yes     HDL Cholesterol: 71 mg/dL     Total Cholesterol: 175 mg/dL    Plan for follow up: 6 weeks or sooner based on results of echo TIME SPENT WITH PATIENT: 50 minutes of direct patient care. More than 50% of that time was spent on coordination of care and counseling regarding review of monitor results, multiple ECGs/rhythm strips, discussion of possible etiologies and plan for evaluation.  Buford Dresser, MD, PhD [place]  [facility]   Medication Adjustments/Labs and Tests Ordered: Current medicines are reviewed at length with the patient today.  Concerns regarding medicines are outlined above.  Orders Placed This Encounter  Procedures  . ECHOCARDIOGRAM COMPLETE   No orders of the defined types were placed in this encounter.   Patient Instructions  Medication Instructions:  Your Physician recommend you continue on your current medication as directed.    If you need a refill on your cardiac medications before your next appointment, please call your pharmacy.   Lab work: None  Testing/Procedures: Your physician has requested that you have an echocardiogram. Echocardiography is a painless test that uses sound waves to create images of your heart. It provides your doctor with information about the size and shape of your heart and how well your heart's chambers and valves are working. This procedure takes approximately one hour. There are  no restrictions for this procedure. [address]   Follow-Up: At [facility], you and your health needs are our priority.  As part of our continuing mission to provide you with exceptional heart care, we have created designated Provider Care Teams.  These Care Teams include your primary Cardiologist  (physician) and Advanced Practice Providers (APPs -  Physician Assistants and Nurse Practitioners) who all work together to provide you with the care you need, when you need it. You will need a follow up appointment in 6 weeks.  Please call our office 2 months in advance to schedule this appointment.  You may see Buford Dresser, MD or one of the following Advanced Practice Providers on your designated Care Team:   Rosaria Ferries, PA-C . Jory Sims, DNP, ANP       Signed, Buford Dresser, MD PhD 11/04/2018 [place]

## 2018-11-04 NOTE — Patient Instructions (Signed)
Medication Instructions:  Your Physician recommend you continue on your current medication as directed.    If you need a refill on your cardiac medications before your next appointment, please call your pharmacy.   Lab work: None  Testing/Procedures: Your physician has requested that you have an echocardiogram. Echocardiography is a painless test that uses sound waves to create images of your heart. It provides your doctor with information about the size and shape of your heart and how well your heart's chambers and valves are working. This procedure takes approximately one hour. There are no restrictions for this procedure. [place] [address]   Follow-Up: At [facility], you and your health needs are our priority.  As part of our continuing mission to provide you with exceptional heart care, we have created designated Provider Care Teams.  These Care Teams include your primary Cardiologist (physician) and Advanced Practice Providers (APPs -  Physician Assistants and Nurse Practitioners) who all work together to provide you with the care you need, when you need it. You will need a follow up appointment in 6 weeks.  Please call our office 2 months in advance to schedule this appointment.  You may see Buford Dresser, MD or one of the following Advanced Practice Providers on your designated Care Team:   Rosaria Ferries, PA-C . Jory Sims, DNP, ANP

## 2018-11-06 ENCOUNTER — Other Ambulatory Visit (INDEPENDENT_AMBULATORY_CARE_PROVIDER_SITE_OTHER): Payer: Medicare Other

## 2018-11-06 DIAGNOSIS — I480 Paroxysmal atrial fibrillation: Secondary | ICD-10-CM

## 2018-11-06 DIAGNOSIS — I10 Essential (primary) hypertension: Secondary | ICD-10-CM

## 2018-11-06 DIAGNOSIS — R0602 Shortness of breath: Secondary | ICD-10-CM

## 2018-11-06 DIAGNOSIS — I5032 Chronic diastolic (congestive) heart failure: Secondary | ICD-10-CM

## 2018-11-06 DIAGNOSIS — R002 Palpitations: Secondary | ICD-10-CM

## 2018-11-06 DIAGNOSIS — I4819 Other persistent atrial fibrillation: Secondary | ICD-10-CM

## 2018-11-07 ENCOUNTER — Encounter: Payer: Self-pay | Admitting: Cardiology

## 2018-11-12 ENCOUNTER — Ambulatory Visit (HOSPITAL_COMMUNITY): Payer: Medicare Other | Attending: Internal Medicine

## 2018-11-12 ENCOUNTER — Other Ambulatory Visit: Payer: Self-pay

## 2018-11-12 DIAGNOSIS — R0602 Shortness of breath: Secondary | ICD-10-CM | POA: Insufficient documentation

## 2018-11-25 ENCOUNTER — Ambulatory Visit (INDEPENDENT_AMBULATORY_CARE_PROVIDER_SITE_OTHER): Payer: Medicare Other | Admitting: *Deleted

## 2018-11-25 ENCOUNTER — Other Ambulatory Visit: Payer: Self-pay

## 2018-11-25 DIAGNOSIS — Z5181 Encounter for therapeutic drug level monitoring: Secondary | ICD-10-CM

## 2018-11-25 DIAGNOSIS — I4819 Other persistent atrial fibrillation: Secondary | ICD-10-CM | POA: Diagnosis not present

## 2018-11-25 LAB — POCT INR: INR: 2.3 (ref 2.0–3.0)

## 2018-11-25 NOTE — Patient Instructions (Signed)
Description   Continue taking 4mg  daily except 2mg  on Sundays. Recheck INR in 5 weeks. [facility] [phone]

## 2018-12-08 DIAGNOSIS — E785 Hyperlipidemia, unspecified: Secondary | ICD-10-CM | POA: Diagnosis not present

## 2018-12-08 DIAGNOSIS — D508 Other iron deficiency anemias: Secondary | ICD-10-CM | POA: Diagnosis not present

## 2018-12-08 DIAGNOSIS — E039 Hypothyroidism, unspecified: Secondary | ICD-10-CM | POA: Diagnosis not present

## 2018-12-08 DIAGNOSIS — G4733 Obstructive sleep apnea (adult) (pediatric): Secondary | ICD-10-CM | POA: Diagnosis not present

## 2018-12-08 DIAGNOSIS — I712 Thoracic aortic aneurysm, without rupture: Secondary | ICD-10-CM | POA: Diagnosis not present

## 2018-12-08 DIAGNOSIS — F330 Major depressive disorder, recurrent, mild: Secondary | ICD-10-CM | POA: Diagnosis not present

## 2018-12-08 DIAGNOSIS — I4891 Unspecified atrial fibrillation: Secondary | ICD-10-CM | POA: Diagnosis not present

## 2018-12-08 DIAGNOSIS — Z23 Encounter for immunization: Secondary | ICD-10-CM | POA: Diagnosis not present

## 2018-12-08 DIAGNOSIS — I779 Disorder of arteries and arterioles, unspecified: Secondary | ICD-10-CM | POA: Diagnosis not present

## 2018-12-08 DIAGNOSIS — I5032 Chronic diastolic (congestive) heart failure: Secondary | ICD-10-CM | POA: Diagnosis not present

## 2018-12-08 DIAGNOSIS — Z0000 Encounter for general adult medical examination without abnormal findings: Secondary | ICD-10-CM | POA: Diagnosis not present

## 2018-12-08 DIAGNOSIS — I10 Essential (primary) hypertension: Secondary | ICD-10-CM | POA: Diagnosis not present

## 2018-12-12 ENCOUNTER — Other Ambulatory Visit: Payer: Self-pay | Admitting: Family Medicine

## 2018-12-12 DIAGNOSIS — E041 Nontoxic single thyroid nodule: Secondary | ICD-10-CM

## 2018-12-16 ENCOUNTER — Other Ambulatory Visit: Payer: Self-pay | Admitting: Pharmacist

## 2018-12-16 MED ORDER — WARFARIN SODIUM 4 MG PO TABS: ORAL_TABLET | ORAL | 1 refills | Status: DC

## 2018-12-17 ENCOUNTER — Other Ambulatory Visit: Payer: Self-pay

## 2018-12-17 MED ORDER — LOSARTAN POTASSIUM 100 MG PO TABS: 100.0000 mg | ORAL_TABLET | Freq: Every day | ORAL | 3 refills | Status: DC

## 2018-12-18 ENCOUNTER — Encounter: Payer: Self-pay | Admitting: Cardiology

## 2018-12-18 ENCOUNTER — Other Ambulatory Visit: Payer: Self-pay

## 2018-12-18 ENCOUNTER — Ambulatory Visit (INDEPENDENT_AMBULATORY_CARE_PROVIDER_SITE_OTHER): Payer: Medicare Other | Admitting: Cardiology

## 2018-12-18 VITALS — BP 148/77 | HR 93 | Temp 97.5°F | Ht 64.0 in | Wt 230.2 lb

## 2018-12-18 DIAGNOSIS — I5032 Chronic diastolic (congestive) heart failure: Secondary | ICD-10-CM

## 2018-12-18 DIAGNOSIS — Z712 Person consulting for explanation of examination or test findings: Secondary | ICD-10-CM | POA: Diagnosis not present

## 2018-12-18 DIAGNOSIS — I77810 Thoracic aortic ectasia: Secondary | ICD-10-CM | POA: Diagnosis not present

## 2018-12-18 DIAGNOSIS — I480 Paroxysmal atrial fibrillation: Secondary | ICD-10-CM | POA: Diagnosis not present

## 2018-12-18 DIAGNOSIS — I10 Essential (primary) hypertension: Secondary | ICD-10-CM

## 2018-12-18 DIAGNOSIS — I340 Nonrheumatic mitral (valve) insufficiency: Secondary | ICD-10-CM

## 2018-12-18 DIAGNOSIS — I359 Nonrheumatic aortic valve disorder, unspecified: Secondary | ICD-10-CM | POA: Diagnosis not present

## 2018-12-18 DIAGNOSIS — I071 Rheumatic tricuspid insufficiency: Secondary | ICD-10-CM | POA: Diagnosis not present

## 2018-12-18 NOTE — Progress Notes (Signed)
Cardiology Office Note:    Date:  12/18/2018   ID:  SAFFIRE PASKINS, DOB September 20, 1945, MRN [id number]  PCP:  Maurice Small, MD  Cardiologist:  Buford Dresser, MD PhD  Referring MD: Maurice Small, MD   CC: follow up  History of Present Illness:    [name] is a 73 y.o. female with a hx ofhx of morbid obesity, mechanical AVR in 2006 by Dr. Prescott Gum, paroxysmal atrial fibrillation (see hx below), OSA on CPAPwho is seen for follow up.  Today: About 10 days ago, went to see Dr. Justin Mend. Started to taper off cymbalta and started to feel better. Rhythm has remained normal as compared to before, nothing fast.   Had blood work with Dr. Justin Mend, reviewed through [facility] today. Had one episode of shortness of breath in the AM, otherwise breathing is good. Weight is down 10 lbs from prior. Intermittent LE edema. Energy level is improved.   Reviewed echo results together thoroughly, went line by line and discussed each finding.  Denies chest pain, shortness of breath at rest or with normal exertion. No PND, orthopnea, LE edema or unexpected weight gain. No syncope or palpitations.  Past Medical History:  Diagnosis Date  . Anxiety   . Aortic stenosis    status post aortic valve replacement with St. Jude mechanical prosthesis  . 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   56mm by echo 06/2016 and 16mm by CT angin 2016  . Chronic anticoagulation   . 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 Depression   . DJD (degenerative joint disease) of knee   . Dyslipidemia   . GERD (gastroesophageal reflux disease)    "several years ago; none since" (11/12/2012)  . Hyperlipidemia   . Hyperlipidemia LDL goal <70 01/19/2017  . Hypertension   . Left atrial enlargement   . Mitral regurgitation 06/25/2016   Mild moderate MR by echo 06/2016  . Obesity   . Persistent atrial fibrillation    s/p afib ablation x 2 at [facility]  . Sleep apnea    On CPAP at 12cm H2O  .  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 - now cleared by neurosurgery to maintain INR 2-2.5    Past Surgical History:  Procedure Laterality Date  . BURR HOLE FOR SUBDURAL HEMATOMA  2006  . CARDIAC VALVE REPLACEMENT  2006   St. Jude AVR  . CARDIOVERSION N/A 07/22/2012   Procedure: CARDIOVERSION;  Surgeon: [name], MD;  Location: [facility] ENDOSCOPY;  Service: Cardiovascular;  Laterality: N/A;  . CARDIOVERSION N/A 11/25/2012   Procedure: CARDIOVERSION;  Surgeon: [name], MD;  Location: [facility] ENDOSCOPY;  Service: Cardiovascular;  Laterality: N/A;  . CARDIOVERSION N/A 12/30/2012   Procedure: CARDIOVERSION;  Surgeon: [name], MD;  Location: [facility] ENDOSCOPY;  Service: Cardiovascular;  Laterality: N/A;  h&p in file-HW   . CARDIOVERSION N/A 03/30/2013   Procedure: CARDIOVERSION;  Surgeon: [name], MD;  Location: [facility] ENDOSCOPY;  Service: Cardiovascular;  Laterality: N/A;  . ELECTROPHYSIOLOGIC STUDY  11/2013   Afib ablation x 2 (11/2013 and 10/2015) at [facility] by Dr Clyda Hurdle with recurrence post ablation  . KNEE ARTHROSCOPY Left 1980's   "2" (11/12/2012)  . KNEE SURGERY Left 1980's   "after 2 scopes they went in and did some kind of OR" (11/12/2012)  . TEE WITHOUT CARDIOVERSION N/A 07/22/2012   Procedure: TRANSESOPHAGEAL ECHOCARDIOGRAM (TEE);  Surgeon: [name], MD;  Location: [place];  Service: Cardiovascular;  Laterality: N/A;  Rm 2034  . TEE WITHOUT CARDIOVERSION N/A 10/07/2013   Procedure: TRANSESOPHAGEAL ECHOCARDIOGRAM (TEE);  Surgeon: [name], MD;  Location: [facility] ENDOSCOPY;  Service: Cardiovascular;  Laterality: N/A;  . TOTAL KNEE ARTHROPLASTY Left 11/05/2014   Procedure: TOTAL KNEE ARTHROPLASTY;  Surgeon: Dorna Leitz, MD;  Location: [place];  Service: Orthopedics;  Laterality: Left;  . TUBAL LIGATION  1980's    Current Medications: Current Outpatient Medications on File Prior to Visit  Medication Sig  . ALPRAZolam (XANAX) 0.5 MG tablet Take 0.5  mg by mouth 2 (two) times daily as needed. AS NEEDED FOR ANXIETY  . amiodarone (PACERONE) 200 MG tablet TAKE 1 TABLET BY MOUTH DAILY  . Ascorbic Acid (VITAMIN C) 1000 MG tablet Take 2,000 mg by mouth daily.  . budesonide-formoterol (SYMBICORT) 80-4.5 MCG/ACT inhaler Inhale 2 puffs into the lungs 2 (two) times a day.  . calcium-vitamin D (OSCAL WITH D) 500-200 MG-UNIT per tablet Take 2 tablets by mouth daily.   . Cholecalciferol (VITAMIN D) 2000 UNITS tablet Take 4,000 Units by mouth daily.  Marland Kitchen DILT-XR 240 MG 24 hr capsule TAKE 1 CAPSULE BY MOUTH EVERY MORNING ON AN EMPTY STOMACH  . furosemide (LASIX) 80 MG tablet TAKE 1 TABLET BY MOUTH EVERY DAY  . levothyroxine (SYNTHROID, LEVOTHROID) 100 MCG tablet Take 100 mcg daily before breakfast by mouth.  . losartan (COZAAR) 100 MG tablet Take 1 tablet (100 mg total) by mouth daily.  . Multiple Vitamin (MULTIVITAMIN WITH MINERALS) TABS Take 1 tablet by mouth daily.  . potassium chloride SA (K-DUR,KLOR-CON) 20 MEQ tablet TAKE 2 TABLETS BY MOUTH EVERY MORNING AND 1 TABLET EVERY EVENING  . pravastatin (PRAVACHOL) 20 MG tablet TAKE 1 TABLET BY MOUTH EVERY DAY  . sertraline (ZOLOFT) 50 MG tablet Take by mouth daily. Pt takes 75 mg daily  . tretinoin (RETIN-A) 0.05 % cream Apply 1 application topically at bedtime.   Marland Kitchen warfarin (COUMADIN) 4 MG tablet Take 1/2 to 1 tablet daily as directed by Coumadin clinic  . zolpidem (AMBIEN) 10 MG tablet Take 5 mg by mouth at bedtime as needed for sleep.   No current facility-administered medications on file prior to visit.      Allergies:   Codeine, Beta adrenergic blockers, Metoprolol, Propoxyphene, Toprol xl [metoprolol tartrate], and Dilantin [phenytoin sodium extended]   Social History   Socioeconomic History  . Marital status: Married    Spouse name: Not on file  . Number of children: Not on file  . Years of education: Not on file  . Highest education level: Not on file  Occupational History  . Not on file   Social Needs  . Financial resource strain: Not on file  . Food insecurity    Worry: Not on file    Inability: Not on file  . Transportation needs    Medical: Not on file    Non-medical: Not on file  Tobacco Use  . Smoking status: Never Smoker  . Smokeless tobacco: Never Used  Substance and Sexual Activity  . Alcohol use: No    Comment: 11/12/2012 "no alcohol in the last 9 years"  . Drug use: No  . Sexual activity: Not Currently  Lifestyle  . Physical activity    Days per week: Not on file    Minutes per session: Not on file  . Stress: Not on file  Relationships  . Social connections    Talks on phone: Not on file  Gets together: Not on file    Attends religious service: Not on file    Active member of club or organization: Not on file    Attends meetings of clubs or organizations: Not on file    Relationship status: Not on file  Other Topics Concern  . Not on file  Social History Narrative   Pt lives in [place] with spouse.  Retired     Family History: The patient's family history includes Heart disease in her brother; Hyperlipidemia in her father; Hypertension in her father and mother; Lung cancer in her mother.  ROS:   Please see the history of present illness.  Additional pertinent ROS: Constitutional: Negative for chills, fever, night sweats, unintentional weight loss  HENT: Negative for ear pain and hearing loss.   Eyes: Negative for loss of vision and eye pain.  Respiratory: Negative for cough, sputum, wheezing.   Cardiovascular: See HPI. Gastrointestinal: Negative for abdominal pain, melena, and hematochezia.  Genitourinary: Negative for dysuria and hematuria.  Musculoskeletal: Negative for falls and myalgias.  Skin: Negative for itching and rash.  Neurological: Negative for focal weakness, focal sensory changes and loss of consciousness.  Endo/Heme/Allergies: Does not bruise/bleed easily. .    EKGs/Labs/Other Studies Reviewed:    The following  studies were reviewed today: Discussed recent echo 11/12/18 at length:  1. The left ventricle has low normal systolic function, with an ejection fraction of 50-55%. The cavity size was normal. Left ventricular diastolic function could not be evaluated  2. The right ventricle has moderately reduced systolic function. The cavity was mildly enlarged. There is no increase in right ventricular wall thickness.  3. Left atrial size was severely dilated.  4. Right atrial size was severely dilated.  5. The mitral valve is grossly normal. There is moderate mitral annular calcification present.  6. The tricuspid valve is grossly normal. Tricuspid valve regurgitation is severe.  7. The observed AV gradient is likely normal for this mechanical valve.  8. The aorta is abnormal in size and structure.  9. There is moderate dilatation of the ascending aorta. 10. The inferior vena cava was dilated in size with <50% respiratory variability. 11. The interatrial septum was not assessed.  Monitor results, prior echoes  EKG:  EKG is personally reviewed.  The ekg ordered today demonstrates regular, wide complex (RBBB) rhythm at 89 BP. One strip with PVC. 1st degree AV block vs. Accelerated junctional rhythm  Recent Labs: 05/27/2018: ALT 13; BUN 13; Creatinine, Ser 0.85; Potassium 4.3; Sodium 140; TSH 4.560  Recent Lipid Panel    Component Value Date/Time   CHOL 175 05/27/2018 1125   TRIG 100 05/27/2018 1125   HDL 71 05/27/2018 1125   CHOLHDL 2.5 05/27/2018 1125   LDLCALC 84 05/27/2018 1125    Physical Exam:    VS:  BP (!) 148/77   Pulse 93   Temp (!) 97.5 F (36.4 C)   Ht 5\' 4"  (1.626 m)   Wt 230 lb 3.2 oz (104.4 kg)   SpO2 92%   BMI 39.51 kg/m     Wt Readings from Last 3 Encounters:  12/18/18 230 lb 3.2 oz (104.4 kg)  11/04/18 233 lb 12.8 oz (106.1 kg)  09/23/18 220 lb (99.8 kg)    GEN: Well nourished, well developed in no acute distress HEENT: Normal, moist mucous membranes NECK: No JVD,  visible TR wave just above clavicle CARDIAC: regular rhythm, normal S1 and S2, no rubs, gallops. Crisp metallic S2. 2/6 SM at sternal border  VASCULAR: Radial and DP pulses 2+ bilaterally. No carotid bruits RESPIRATORY:  Clear to auscultation without rales, wheezing or rhonchi  ABDOMEN: Soft, non-tender, non-distended MUSCULOSKELETAL:  Ambulates independently SKIN: Warm and dry, no edema NEUROLOGIC:  Alert and oriented x 3. No focal neuro deficits noted. PSYCHIATRIC:  Normal affect   ASSESSMENT:    1. Chronic diastolic CHF (congestive heart failure) (HCC)   2. Paroxysmal atrial fibrillation ([place])   3. Essential hypertension   4. Nonrheumatic mitral valve regurgitation   5. Aortic valve disorder   6. Ascending aorta dilatation (HCC)   7. Encounter to discuss test results   8. Severe tricuspid regurgitation    PLAN:    Chronic diastolic heart failure, mitral regurgitation, tricuspid regurgitation with shortness of breath, fatigue: appears euvolemic today -LV EF normal, on my review has abnormal diastolic function; severe biatrial enlargement, severe TR, normal gradient across mechanical aortic valve -improving symptoms, only recent change is taper of cymbalta and restart of sertraline -will alert me if symptoms recur  Paroxysmal atrial fibrillation: on coumadin for mechanical valve. Chadsvasc=4 -has extensive treatment history, see prior notes.  -on amiodarone long term -checked TSH, LFTs at prior visit -check PFTs at follow up  Mechanical aortic valve -follows with coumadin clinic -stable gradient on echo  Hypertension: at goal today -continue diltiazem 240 mg daily, furosemide 80 mg daily, losartan 100 mg  Stable ascending aortic aneurysm: 4.4 2018-2019.  OSA: continue CPAP  Cardiac risk counseling and prevention recommendations: -recommend heart healthy/Mediterranean diet, with whole grains, fruits, vegetable, fish, lean meats, nuts, and olive oil. Limit salt.  -recommend moderate walking, 3-5 times/week for 30-50 minutes each session. Aim for at least 150 minutes.week. Goal should be pace of 3 miles/hours, or walking 1.5 miles in 30 minutes -recommend avoidance of tobacco products. Avoid excess alcohol. -continue pravastatin for elevated ASCVD risk -ASCVD risk score: The 10-year ASCVD risk score Mikey Bussing DC Brooke Bonito., et al., 2013) is: 21.5%   Values used to calculate the score:     Age: 12 years     Sex: Female     Is Non-Hispanic African American: No     Diabetic: No     Tobacco smoker: No     Systolic Blood Pressure: 123456 mmHg     Is BP treated: Yes     HDL Cholesterol: 71 mg/dL     Total Cholesterol: 175 mg/dL    Plan for follow up: 6 months  TIME SPENT WITH PATIENT: 25 minutes of direct patient care. More than 50% of that time was spent on coordination of care and counseling regarding review of symptoms, line by line review of her echo results.  Buford Dresser, MD, PhD [place]  [facility]   Medication Adjustments/Labs and Tests Ordered: Current medicines are reviewed at length with the patient today.  Concerns regarding medicines are outlined above.  No orders of the defined types were placed in this encounter.  No orders of the defined types were placed in this encounter.   Patient Instructions  Medication Instructions:  Your physician recommends that you continue on your current medications as directed. Please refer to the Current Medication list given to you today.  If you need a refill on your cardiac medications before your next appointment, please call your pharmacy.   Lab work: NONE   Testing/Procedures: NONE  Follow-Up: At [facility], you and your health needs are our priority.  As part of our continuing mission to provide you with exceptional heart care, we have  created designated Provider Care Teams.  These Care Teams include your primary Cardiologist (physician) and Advanced Practice Providers (APPs -   Physician Assistants and Nurse Practitioners) who all work together to provide you with the care you need, when you need it. You will need a follow up appointment in 6 months.  Please call our office 2 months in advance to schedule this appointment.  You may see Buford Dresser, MD or one of the following Advanced Practice Providers on your designated Care Team:   Rosaria Ferries, PA-C Leonia Reader, PA-C         Signed, Buford Dresser, MD PhD 11/04/2018 [place]

## 2018-12-18 NOTE — Patient Instructions (Signed)
Medication Instructions:  Your physician recommends that you continue on your current medications as directed. Please refer to the Current Medication list given to you today.  If you need a refill on your cardiac medications before your next appointment, please call your pharmacy.   Lab work: NONE   Testing/Procedures: NONE  Follow-Up: At [facility], you and your health needs are our priority.  As part of our continuing mission to provide you with exceptional heart care, we have created designated Provider Care Teams.  These Care Teams include your primary Cardiologist (physician) and Advanced Practice Providers (APPs -  Physician Assistants and Nurse Practitioners) who all work together to provide you with the care you need, when you need it. You will need a follow up appointment in 6 months.  Please call our office 2 months in advance to schedule this appointment.  You may see Buford Dresser, MD or one of the following Advanced Practice Providers on your designated Care Team:   Rosaria Ferries, PA-C Leonia Reader, PA-C

## 2018-12-22 ENCOUNTER — Ambulatory Visit
Admission: RE | Admit: 2018-12-22 | Discharge: 2018-12-22 | Disposition: A | Discharge status: Home / Self Care | Payer: Medicare Other | Source: Ambulatory Visit | Attending: Family Medicine | Admitting: Family Medicine

## 2018-12-22 DIAGNOSIS — E042 Nontoxic multinodular goiter: Secondary | ICD-10-CM | POA: Diagnosis not present

## 2018-12-22 DIAGNOSIS — E041 Nontoxic single thyroid nodule: Secondary | ICD-10-CM

## 2018-12-30 ENCOUNTER — Ambulatory Visit (INDEPENDENT_AMBULATORY_CARE_PROVIDER_SITE_OTHER): Payer: Medicare Other | Admitting: *Deleted

## 2018-12-30 ENCOUNTER — Other Ambulatory Visit: Payer: Self-pay

## 2018-12-30 DIAGNOSIS — I4819 Other persistent atrial fibrillation: Secondary | ICD-10-CM | POA: Diagnosis not present

## 2018-12-30 DIAGNOSIS — Z5181 Encounter for therapeutic drug level monitoring: Secondary | ICD-10-CM

## 2018-12-30 LAB — POCT INR: INR: 1.7 — AB (ref 2.0–3.0)

## 2018-12-30 NOTE — Patient Instructions (Signed)
Description    Take an extra 1/2 tablet today, then continue taking 4mg  daily except 2mg  on Sundays. Recheck INR in 3 weeks. Coumadin Clinic [phone]

## 2019-01-13 ENCOUNTER — Other Ambulatory Visit: Payer: Self-pay | Admitting: Cardiology

## 2019-01-14 ENCOUNTER — Other Ambulatory Visit: Payer: Self-pay | Admitting: Internal Medicine

## 2019-01-20 ENCOUNTER — Other Ambulatory Visit: Payer: Self-pay

## 2019-01-20 ENCOUNTER — Ambulatory Visit (INDEPENDENT_AMBULATORY_CARE_PROVIDER_SITE_OTHER): Payer: Medicare Other | Admitting: *Deleted

## 2019-01-20 DIAGNOSIS — Z5181 Encounter for therapeutic drug level monitoring: Secondary | ICD-10-CM

## 2019-01-20 DIAGNOSIS — I480 Paroxysmal atrial fibrillation: Secondary | ICD-10-CM

## 2019-01-20 DIAGNOSIS — I4819 Other persistent atrial fibrillation: Secondary | ICD-10-CM | POA: Diagnosis not present

## 2019-01-20 LAB — POCT INR: INR: 2 (ref 2.0–3.0)

## 2019-01-20 NOTE — Patient Instructions (Signed)
Description   Continue taking 4mg daily except 2mg on Sundays. Recheck INR in 4 weeks. Coumadin Clinic #[phone]     

## 2019-01-26 DIAGNOSIS — Z1211 Encounter for screening for malignant neoplasm of colon: Secondary | ICD-10-CM | POA: Diagnosis not present

## 2019-02-14 ENCOUNTER — Other Ambulatory Visit: Payer: Self-pay | Admitting: Cardiology

## 2019-02-16 ENCOUNTER — Other Ambulatory Visit: Payer: Self-pay

## 2019-02-16 MED ORDER — FUROSEMIDE 80 MG PO TABS: 80.0000 mg | ORAL_TABLET | Freq: Every day | ORAL | 1 refills | Status: DC

## 2019-02-18 ENCOUNTER — Other Ambulatory Visit: Payer: Self-pay

## 2019-02-18 ENCOUNTER — Ambulatory Visit (INDEPENDENT_AMBULATORY_CARE_PROVIDER_SITE_OTHER): Payer: Medicare Other | Admitting: *Deleted

## 2019-02-18 DIAGNOSIS — Z5181 Encounter for therapeutic drug level monitoring: Secondary | ICD-10-CM | POA: Diagnosis not present

## 2019-02-18 DIAGNOSIS — I4819 Other persistent atrial fibrillation: Secondary | ICD-10-CM | POA: Diagnosis not present

## 2019-02-18 LAB — POCT INR: INR: 1.9 — AB (ref 2.0–3.0)

## 2019-02-18 NOTE — Patient Instructions (Signed)
Description   Take an extra 1/2 tablet today, then Continue taking 4mg  daily except 2mg  on Sundays. Recheck INR in 3 weeks. Coumadin Clinic [phone]

## 2019-03-02 ENCOUNTER — Other Ambulatory Visit: Payer: Self-pay

## 2019-03-02 DIAGNOSIS — Z20822 Contact with and (suspected) exposure to covid-19: Secondary | ICD-10-CM

## 2019-03-02 DIAGNOSIS — Z20828 Contact with and (suspected) exposure to other viral communicable diseases: Secondary | ICD-10-CM | POA: Diagnosis not present

## 2019-03-03 LAB — NOVEL CORONAVIRUS, NAA: SARS-CoV-2, NAA: NOT DETECTED

## 2019-03-10 ENCOUNTER — Other Ambulatory Visit: Payer: Self-pay

## 2019-03-10 ENCOUNTER — Ambulatory Visit (INDEPENDENT_AMBULATORY_CARE_PROVIDER_SITE_OTHER): Payer: Medicare Other | Admitting: *Deleted

## 2019-03-10 DIAGNOSIS — I480 Paroxysmal atrial fibrillation: Secondary | ICD-10-CM

## 2019-03-10 DIAGNOSIS — I4819 Other persistent atrial fibrillation: Secondary | ICD-10-CM

## 2019-03-10 DIAGNOSIS — Z5181 Encounter for therapeutic drug level monitoring: Secondary | ICD-10-CM

## 2019-03-10 LAB — POCT INR: INR: 3.4 — AB (ref 2.0–3.0)

## 2019-03-10 NOTE — Telephone Encounter (Signed)
Called pt to schedule virtual appointment. Left message to call back.

## 2019-03-10 NOTE — Patient Instructions (Signed)
Description   Do not take any Warfarin today then continue taking 4mg  daily except 2mg  on Sundays. Recheck INR in 2 weeks. Coumadin Clinic [phone]

## 2019-03-10 NOTE — Telephone Encounter (Signed)
Can we set [name] up with a visit to discuss her concerns? Thank you.

## 2019-03-24 ENCOUNTER — Ambulatory Visit (INDEPENDENT_AMBULATORY_CARE_PROVIDER_SITE_OTHER): Payer: Medicare Other | Admitting: *Deleted

## 2019-03-24 ENCOUNTER — Other Ambulatory Visit: Payer: Self-pay

## 2019-03-24 DIAGNOSIS — I480 Paroxysmal atrial fibrillation: Secondary | ICD-10-CM

## 2019-03-24 DIAGNOSIS — I4819 Other persistent atrial fibrillation: Secondary | ICD-10-CM | POA: Diagnosis not present

## 2019-03-24 DIAGNOSIS — Z5181 Encounter for therapeutic drug level monitoring: Secondary | ICD-10-CM | POA: Diagnosis not present

## 2019-03-24 LAB — POCT INR: INR: 2.4 (ref 2.0–3.0)

## 2019-03-24 NOTE — Patient Instructions (Addendum)
Description   Continue taking 4mg  daily except 2mg  on Sundays. Recheck INR in 3 weeks. Coumadin Clinic [phone]

## 2019-04-14 ENCOUNTER — Ambulatory Visit (INDEPENDENT_AMBULATORY_CARE_PROVIDER_SITE_OTHER): Payer: Medicare Other

## 2019-04-14 ENCOUNTER — Other Ambulatory Visit: Payer: Self-pay

## 2019-04-14 DIAGNOSIS — I4819 Other persistent atrial fibrillation: Secondary | ICD-10-CM

## 2019-04-14 DIAGNOSIS — I480 Paroxysmal atrial fibrillation: Secondary | ICD-10-CM

## 2019-04-14 DIAGNOSIS — Z5181 Encounter for therapeutic drug level monitoring: Secondary | ICD-10-CM | POA: Diagnosis not present

## 2019-04-14 LAB — POCT INR: INR: 2.4 (ref 2.0–3.0)

## 2019-04-14 NOTE — Patient Instructions (Signed)
Description   Continue taking 4mg  daily except 2mg  on Sundays. Recheck INR in 4 weeks. Coumadin Clinic [phone]

## 2019-04-15 ENCOUNTER — Other Ambulatory Visit: Payer: Self-pay | Admitting: Cardiology

## 2019-04-24 ENCOUNTER — Telehealth (INDEPENDENT_AMBULATORY_CARE_PROVIDER_SITE_OTHER): Payer: Medicare Other | Admitting: Primary Care

## 2019-04-24 DIAGNOSIS — R0600 Dyspnea, unspecified: Secondary | ICD-10-CM | POA: Diagnosis not present

## 2019-04-24 DIAGNOSIS — J45991 Cough variant asthma: Secondary | ICD-10-CM | POA: Diagnosis not present

## 2019-04-24 DIAGNOSIS — R0609 Other forms of dyspnea: Secondary | ICD-10-CM

## 2019-04-24 MED ORDER — AZELASTINE HCL 0.1 % NA SOLN: 1.0000 | Freq: Two times a day (BID) | NASAL | 1 refills | Status: DC

## 2019-04-24 NOTE — Progress Notes (Signed)
Virtual Visit via Video Note  I connected with [name] on 04/24/19 at  4:30 PM EST by a video enabled telemedicine application and verified that I am speaking with the correct person using two identifiers.  Location: Patient: Home Provider: Office   I discussed the limitations of evaluation and management by telemedicine and the availability of in person appointments. The patient expressed understanding and agreed to proceed.  Brief patient profile:  71yowf never smoker  s/p AVR in 2006 then s/p LKR July 2016 and since then in and out of Afib assoc with sob / fatigue can last a few secs - days then feel fine in between referred to pulmonary clinic 04/03/2016 by Dr   [name] with abn pfts with h/o amiodarone exposure first in 2014 and restarted 03/19/16 with pfts c/w reversible airflow obst 12/11/2016    History of Present Illness: 74 year old female, never smoked.  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 fib, severe tricuspid regurgitation, chronic diastolic heart failure, aortic valve disorder.  Patient of Dr. Melvyn [name], last seen 04/25/2017.  Unclear if patient has mild asthma. During last visit re-challenged with Symbicort 80 2 puffs BID x1 week then back off to as needed.   04/24/2019 Patient contacted today for acute video visit. She has not been seen in 2 years. Reports increased shortness of breath and cough over the last 4 months. Notices shortness of breath, wheezing and productive cough particularly in the morning. Associated fatigue. She hadn't needed to use her Symbicort for awhile, resume in July and she was given three refills. She is currently using Symbicort as needed 1-2 times a day. Feels it helps somewhat for a little while. Occasional fluid retention, she takes 80mg  lasix daily. When she has increased leg swelling she will take an additional lasix tablet. She  wears CPAP every night with nocturnal oxygen. She experiences occasional nose bleeds, she is on coumadin and reports levels have been normal. She did turn up her humidification to level 3 d/t cough. Denies fever, chills, chest pain/tightness, sinus congestion/pressure, acid reflux.   Observations/Objective:  - Appears well, able to speak in full sentences - No obvious shortness of breath, wheezing or cough noted during video visit  PFT's  03/21/2016  FEV1 1.50 (58 % ) ratio 80  p 14 % improvement from saba p nothing prior to study with DLCO  65/66c % corrects to 94  % for alv volume   - FENO 04/03/2016  =   7  Allergy profile 04/03/2016 >  Eos 0.1 /  IgE  17 neg RAST - PFT's  06/01/2016  FEV1 1.57 (61 % ) ratio 84  p 22 % improvement from saba p nothing prior to study with DLCO  57/56 % corrects to 87  % for alv volume  - Sinus CT 06/08/2016 > No evidence of sinusitis - trial of dysmista 08/30/2016 > resolved and did not recur off rx as of 12/11/2016  - PFT's  12/11/2016  FEV1 1.59 (63 % ) ratio 80  p 30 % improvement from saba p nothing prior to study with DLCO  61/60 % corrects to 103  % for alv volume  With erv 9% and classic obst curvature    Assessment and Plan:  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 Melvyn [name] or NP    I discussed the assessment and treatment plan with the patient. The patient was provided an opportunity to ask questions and all were answered. The patient agreed with the plan and demonstrated an understanding of the instructions.   The patient was advised to call back or seek an in-person evaluation if the  symptoms worsen or if the condition fails to improve as anticipated.  I provided 20 minutes of non-face-to-face time during this encounter.   Martyn Ehrich, NP

## 2019-04-24 NOTE — Patient Instructions (Addendum)
Pleasure speaking with you today [name]  Recommendations: Take Symbicort 2 puffs twice daily for the next 1 to 2 weeks Use Ocean nasal spray daily at bedtime If notice increase in weight or leg swelling please take additional Lasix  Orders: Chest x-ray re: shortness of breath and cough Labs (CBC with differential, BNP, BMET)  Follow-up: 2-4 weeks with Dr. Melvyn Novas or Eustaquio Maize NP if no availability

## 2019-04-27 ENCOUNTER — Ambulatory Visit (INDEPENDENT_AMBULATORY_CARE_PROVIDER_SITE_OTHER): Payer: Medicare Other

## 2019-04-27 ENCOUNTER — Other Ambulatory Visit (INDEPENDENT_AMBULATORY_CARE_PROVIDER_SITE_OTHER): Payer: Medicare Other

## 2019-04-27 DIAGNOSIS — R0609 Other forms of dyspnea: Secondary | ICD-10-CM

## 2019-04-27 DIAGNOSIS — R0602 Shortness of breath: Secondary | ICD-10-CM | POA: Diagnosis not present

## 2019-04-27 DIAGNOSIS — R0600 Dyspnea, unspecified: Secondary | ICD-10-CM | POA: Diagnosis not present

## 2019-04-27 LAB — CBC WITH DIFFERENTIAL/PLATELET
Basophils Absolute: 0.1 10*3/uL (ref 0.0–0.1)
Basophils Relative: 1.1 % (ref 0.0–3.0)
Eosinophils Absolute: 0.1 10*3/uL (ref 0.0–0.7)
Eosinophils Relative: 1.6 % (ref 0.0–5.0)
HCT: 38.5 % (ref 36.0–46.0)
Hemoglobin: 12.7 g/dL (ref 12.0–15.0)
Lymphocytes Relative: 11.5 % — ABNORMAL LOW (ref 12.0–46.0)
Lymphs Abs: 0.7 10*3/uL (ref 0.7–4.0)
MCHC: 33 g/dL (ref 30.0–36.0)
MCV: 93.7 fl (ref 78.0–100.0)
Monocytes Absolute: 0.6 10*3/uL (ref 0.1–1.0)
Monocytes Relative: 10.5 % (ref 3.0–12.0)
Neutro Abs: 4.6 10*3/uL (ref 1.4–7.7)
Neutrophils Relative %: 75.3 % (ref 43.0–77.0)
Platelets: 237 10*3/uL (ref 150.0–400.0)
RBC: 4.11 Mil/uL (ref 3.87–5.11)
RDW: 14.9 % (ref 11.5–15.5)
WBC: 6.1 10*3/uL (ref 4.0–10.5)

## 2019-04-27 LAB — BASIC METABOLIC PANEL
BUN: 18 mg/dL (ref 6–23)
CO2: 27 mEq/L (ref 19–32)
Calcium: 9.3 mg/dL (ref 8.4–10.5)
Chloride: 101 mEq/L (ref 96–112)
Creatinine, Ser: 0.89 mg/dL (ref 0.40–1.20)
GFR: 62.02 mL/min (ref >60.00)
Glucose, Bld: 101 mg/dL — ABNORMAL HIGH (ref 70–99)
Potassium: 3.9 mEq/L (ref 3.5–5.1)
Sodium: 139 mEq/L (ref 135–145)

## 2019-04-27 LAB — BRAIN NATRIURETIC PEPTIDE: Pro B Natriuretic peptide (BNP): 264 pg/mL — ABNORMAL HIGH (ref 0.0–100.0)

## 2019-04-27 NOTE — Progress Notes (Signed)
Please let patient know CXR showed vascular congestion and possible infiltrate right lower lung. Fluid level was elevated, recommend she take additional lasix for 2-3 days and contact cardiology. Please send in zpack for possible CAP.  Repeat CXR in 4-6 weeks.

## 2019-04-28 ENCOUNTER — Telehealth: Payer: Self-pay | Admitting: Primary Care

## 2019-04-28 NOTE — Telephone Encounter (Signed)
LMTCB x1 for pt.  

## 2019-04-28 NOTE — Telephone Encounter (Signed)
[name], [place]  04/27/2019 3:26 PM EST    ATC pt, no answer. Left message for pt to call back.   Martyn Ehrich, NP  04/27/2019 1:38 PM EST    Please let patient know CXR showed vascular congestion and possible infiltrate right lower lung. Fluid level was elevated, recommend she take additional lasix for 2-3 days and contact cardiology. Please send in zpack for possible CAP. Repeat CXR in 4-6 weeks.  ------------------------------------------------ LMTCB x1 for pt.

## 2019-04-28 NOTE — Telephone Encounter (Signed)
[phone] pt calling back

## 2019-04-29 ENCOUNTER — Telehealth: Payer: Self-pay | Admitting: Internal Medicine

## 2019-04-29 MED ORDER — AZITHROMYCIN 250 MG PO TABS: 250.0000 mg | ORAL_TABLET | ORAL | 0 refills | Status: DC

## 2019-04-29 NOTE — Telephone Encounter (Signed)
Please let patient know CXR showed vascular congestion and possible infiltrate right lower lung. Fluid level was elevated, recommend she take additional lasix for 2-3 days and contact cardiology. Please send in zpack for possible CAP. Repeat CXR in 4-6 weeks.  Spoke with pt and notified of results per [facility]. Pt verbalized understanding and denied any questions. Zpack was sent  Appt with MW scheduled for 06/08/19

## 2019-04-30 NOTE — Telephone Encounter (Signed)
Spoke with pt. She has already been made aware of her results. Nothing further was needed.

## 2019-05-05 ENCOUNTER — Other Ambulatory Visit: Payer: Self-pay | Admitting: Internal Medicine

## 2019-05-05 DIAGNOSIS — F330 Major depressive disorder, recurrent, mild: Secondary | ICD-10-CM | POA: Diagnosis not present

## 2019-05-05 MED ORDER — BUDESONIDE-FORMOTEROL FUMARATE 80-4.5 MCG/ACT IN AERO: 2.0000 | INHALATION_SPRAY | Freq: Two times a day (BID) | RESPIRATORY_TRACT | 3 refills | Status: DC

## 2019-05-15 ENCOUNTER — Ambulatory Visit: Payer: Medicare Other | Admitting: Internal Medicine

## 2019-05-22 ENCOUNTER — Other Ambulatory Visit: Payer: Self-pay

## 2019-05-22 ENCOUNTER — Ambulatory Visit (INDEPENDENT_AMBULATORY_CARE_PROVIDER_SITE_OTHER): Payer: Medicare Other | Admitting: *Deleted

## 2019-05-22 DIAGNOSIS — I480 Paroxysmal atrial fibrillation: Secondary | ICD-10-CM

## 2019-05-22 DIAGNOSIS — Z5181 Encounter for therapeutic drug level monitoring: Secondary | ICD-10-CM | POA: Diagnosis not present

## 2019-05-22 LAB — POCT INR: INR: 2.7 (ref 2.0–3.0)

## 2019-05-22 NOTE — Patient Instructions (Addendum)
Description   Today take (2mg ) 1/2 tablet then continue taking 4mg  daily except 2mg  on Sundays. Recheck INR in 4 weeks. Coumadin Clinic [phone]

## 2019-06-01 DIAGNOSIS — T45515A Adverse effect of anticoagulants, initial encounter: Secondary | ICD-10-CM | POA: Diagnosis not present

## 2019-06-01 DIAGNOSIS — R040 Epistaxis: Secondary | ICD-10-CM | POA: Diagnosis not present

## 2019-06-04 MED ORDER — FUROSEMIDE 80 MG PO TABS: 80.0000 mg | ORAL_TABLET | Freq: Every day | ORAL | 3 refills | Status: DC

## 2019-06-04 NOTE — Telephone Encounter (Signed)
Ok to refill. I would write for extra--120 pills is fine and can say 1 pill daily and can use extra 1 pill PRN for swelling.

## 2019-06-05 ENCOUNTER — Other Ambulatory Visit: Payer: Self-pay

## 2019-06-05 DIAGNOSIS — R0609 Other forms of dyspnea: Secondary | ICD-10-CM

## 2019-06-05 DIAGNOSIS — J45991 Cough variant asthma: Secondary | ICD-10-CM

## 2019-06-05 NOTE — Progress Notes (Signed)
che

## 2019-06-08 ENCOUNTER — Other Ambulatory Visit: Payer: Self-pay

## 2019-06-08 ENCOUNTER — Encounter: Payer: Self-pay | Admitting: Internal Medicine

## 2019-06-08 ENCOUNTER — Ambulatory Visit (INDEPENDENT_AMBULATORY_CARE_PROVIDER_SITE_OTHER): Payer: Medicare Other | Admitting: Internal Medicine

## 2019-06-08 ENCOUNTER — Ambulatory Visit (INDEPENDENT_AMBULATORY_CARE_PROVIDER_SITE_OTHER)
Admission: RE | Admit: 2019-06-08 | Discharge: 2019-06-08 | Disposition: A | Discharge status: Home / Self Care | Payer: Medicare Other | Source: Ambulatory Visit | Attending: Primary Care | Admitting: Primary Care

## 2019-06-08 DIAGNOSIS — R0609 Other forms of dyspnea: Secondary | ICD-10-CM

## 2019-06-08 DIAGNOSIS — R05 Cough: Secondary | ICD-10-CM | POA: Diagnosis not present

## 2019-06-08 DIAGNOSIS — R0600 Dyspnea, unspecified: Secondary | ICD-10-CM

## 2019-06-08 DIAGNOSIS — J45991 Cough variant asthma: Secondary | ICD-10-CM

## 2019-06-08 DIAGNOSIS — G4733 Obstructive sleep apnea (adult) (pediatric): Secondary | ICD-10-CM | POA: Diagnosis not present

## 2019-06-08 NOTE — Progress Notes (Signed)
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 Pulmonary Consult    Dr. Rayann Heman sent pt here, PFT showed advanced lung disease, pt states is fatigued and SOB  on best best days can walk the neighborhood x 7-8 min includes some hills for the first time as previously lived [place]atter neighborhood and ever since new walk has noted more sob, but only on the hills Sleeps with cpap feeling fine/ [name] Each am wakes up with cough productive mucus is yellowy green x "a while" =  months / Not years assoc with nasal congestion but no wheezing  rec Augmentin 875 mg take one pill twice daily  X 10 days  Need to let the coumadin clinic know you are on this medication Ocean nasal spray as much as you want     06/01/2016  f/u ov/Gianella Chismar re: still on amiodarone Chief Complaint  Patient presents with  . Follow-up    PFT's done today. Breathing is "fine" has good days and bad days. She c/o nasal congestion recently.   can walk 10 min around neighborhood x some steep hills slow her down but never has to stop  No more bad days since first of the year / feels like afib better and that's what is making the difference  Main concern is variable nasal congestion and sense of pnds x months, worse at hs and in am assoc with cough Has tried otcs no better rec CT sinus 06/08/16 > neg / allergy profile neg 04/03/16     08/30/2016  f/u ov/Tashay Bozich re: amiodarone  surveillance/ rhinitis with tendency to uacs  Chief Complaint  Patient presents with  . Follow-up    Pt c/o constant nasal congestion. She also c/o non prod cough for the past several wks.    ex tol varies with no pattern and on best days can still do ex fine  Nasal congestion started end of  2017 / better with afrin worse at hs but thinks becoming addicted to it  rec Dymista one twice daily until use up sample then if better       12/11/2016  f/u ov/Egor Fullilove re:   Asthma/ rhinitis neg allergy eval  Chief Complaint  Patient presents with  . Follow-up    PFT done today, still SOB with exertion  continues with variable ex tol s specific pattern she is aware of  Nasal symptoms resolved p took dymista and did not refill and they have not recurred s need for otcs  Has never used saba before   rec Try symicort 160 Take 2 puffs first thing in am and then another 2 puffs about 12 hours later.  Work on inhaler technique:  relax and gently blow all the way out then take a nice smooth deep breath back in, triggering the inhaler at same time you start  breathing in.  Hold for up to 5 seconds if you can. Blow out thru nose. Rinse and gargle with water when done   01/22/2017  f/u ov/Keviana Guida re:  Doe on amio/ no better on symb 160 and now coughing  Chief Complaint  Patient presents with  . Follow-up    She states Symbicort made her cough more and so she has stopped taking med every day. She was given an albuterol inhaler by her PCP and uses this maybe once per wk on average.   walks dogs x 20 min with fine flat surface but some sob on hills for about about a min and no better on symb but hfa poor housework also makes her sob / saba no better  tsh high and thyroid being ajusted up by pcp  Coughs non productive only p symbicort 160 and did not cough like this prev/ no assoc pnds as before  rec Symbicort 80  Take 2 puffs first thing in am and then another 2 puffs about 12 hours later.  Work on inhaler  technique:      04/25/2017  f/u ov/Karol Liendo re:  Doe on amio / poor hfa / 02 and cpap per Dr Radford Pax Chief Complaint  Patient presents with  . Follow-up    6MW test done today. She is not coughing any less and states her breathing is unchanged. She states she does not have an albuterol inhaler.   still sob with vacuuming/ not sure syb 80 helping but hfa still poor  rec  Monitor your 02 saturation toward the end of your exertion Work on inhaler technique:  Take the dulera 100 = symb 80 Take 2 puffs first thing in am and then another 2 puffs about 12 hours later and they try off to see if any difference in either your cough or breathing and if either is worse start back on it    04/24/19 NP eval  Pleasure speaking with you today [name] Recommendations: Take Symbicort 2 puffs twice daily for the next 1 to 2 weeks Use Ocean nasal spray daily at bedtime If notice increase in weight or leg swelling please take additional Lasix Chest x-ray re: shortness of breath and cough Labs (CBC with differential, BNP, BMET) rez zpak /lasix    06/08/2019  f/u ov/Zoanne Newill re: worse breathing/ coughing x 12/2018 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 Follow-up    Pt c/o cough with yellow, occ bloody sputum in the mornings. She also c/o SOB and wheezing.   Dyspnea:  Very sedentary  Cough: x 2 -3 m esp first thing in am described above / min vol/ min heme on coumadin s epistaxis  Sleeping: on cpap/ 3.5 lpm and desats on this SABA use: none  02: only using at hs x 2lpm / not monitoring sats with activity as rec    No obvious day to day or daytime variability or assoc  mucus plugs or hemoptysis or cp or chest tightness, subjective wheeze or overt sinus or hb symptoms.   Sleeping as above without nocturnal   exacerbation  of respiratory  c/o's or need for noct saba. Also denies any obvious fluctuation of symptoms with weather or environmental  changes or other aggravating or alleviating factors except as outlined above   No unusual exposure hx or h/o childhood pna/ asthma or knowledge of premature birth.  Current Allergies, Complete Past Medical History, Past Surgical  History, Family History, and Social History were reviewed in [employer] record.  ROS  The following are not active complaints unless bolded Hoarseness, sore throat, dysphagia, dental problems, itching, sneezing,  nasal congestion or discharge of excess mucus or purulent secretions, ear ache,   fever, chills, sweats, unintended wt loss or wt gain, classically pleuritic or exertional cp,  orthopnea pnd or arm/hand swelling  or leg swelling, presyncope, palpitations, abdominal pain, anorexia, nausea, vomiting, diarrhea  or change in bowel habits or change in bladder habits, change in stools or change in urine, dysuria, hematuria,  rash, arthralgias, visual complaints, headache, numbness, weakness or ataxia or problems with walking or coordination,  change in mood or  memory.        Current Meds  Medication Sig  . ALPRAZolam (XANAX) 0.5 MG tablet Take 0.5 mg by mouth 2 (two) times daily as needed. AS NEEDED FOR ANXIETY  . amiodarone (PACERONE) 200 MG tablet Take 1 tablet (200 mg total) by mouth daily.  . Ascorbic Acid (VITAMIN C) 1000 MG tablet Take 2,000 mg by mouth daily.  Marland Kitchen azelastine (ASTELIN) 0.1 % nasal spray Place 1 spray into both nostrils 2 (two) times daily. 1 spray into both nostrils twice daily for PND following ocean nasal spray  . budesonide-formoterol (SYMBICORT) 80-4.5 MCG/ACT inhaler Inhale 2 puffs into the lungs 2 (two) times daily.  . calcium-vitamin D (OSCAL WITH D) 500-200 MG-UNIT per tablet Take 2 tablets by mouth daily.   . Cholecalciferol (VITAMIN D) 2000 UNITS tablet Take 4,000 Units by mouth daily.  Marland Kitchen DILT-XR 240 MG 24 hr capsule TAKE 1 CAPSULE BY MOUTH EVERY MORNING ON AN EMPTY STOMACH  . furosemide (LASIX) 80 MG tablet Take  1 tablet (80 mg total) by mouth daily. May take an additional tablet as needed for swelling  . levothyroxine (SYNTHROID, LEVOTHROID) 100 MCG tablet Take 100 mcg daily before breakfast by mouth.  . losartan (COZAAR) 100 MG tablet Take 1 tablet (100 mg total) by mouth daily.  . Multiple Vitamin (MULTIVITAMIN WITH MINERALS) TABS Take 1 tablet by mouth daily.  . potassium chloride SA (KLOR-CON) 20 MEQ tablet TAKE 2 TABLETS BY MOUTH EVERY MORNING AND 1 TABLET EVERY EVENING  . pravastatin (PRAVACHOL) 20 MG tablet TAKE 1 TABLET BY MOUTH EVERY DAY  . sertraline (ZOLOFT) 50 MG tablet Take 100 mg by mouth daily. Pt takes 75 mg daily   . tretinoin (RETIN-A) 0.05 % cream Apply 1 application topically at bedtime.   Marland Kitchen warfarin (COUMADIN) 4 MG tablet Take 1/2 to 1 tablet daily as directed by Coumadin clinic  . zolpidem (AMBIEN) 10 MG tablet Take 5 mg by mouth at bedtime as needed for sleep.               Objective:   Physical Exam  amb obese wf nad   06/08/2019         232 04/25/2017       231  01/22/2017     237  12/11/2016         239  08/30/2016       235  06/01/2016       235   04/03/16 233 lb 6.4 oz (105.9 kg)  03/19/16 240 lb 12.8 oz (109.2 kg)  12/28/15 229 lb 6.4 oz (104.1 kg)     Vital signs reviewed  06/08/2019  - Note at rest 02 sats   93% on RA      HEENT : pt wearing mask not  removed for exam due to covid -19 concerns.    NECK :  without JVD/Nodes/TM/ nl carotid upstrokes bilaterally   LUNGS: no acc muscle use,  Nl contour chest which is clear to A and P bilaterally without cough on insp or exp maneuvers   CV:  RRR  no s3 or murmur or increase in P2, and no edema   ABD:  soft and nontender with nl inspiratory excursion in the supine position. No bruits or organomegaly appreciated, bowel sounds nl  MS:  Nl gait/ ext warm without deformities, calf tenderness, cyanosis or clubbing No obvious joint restrictions   SKIN: warm and dry without lesions    NEURO:  alert, approp, nl  sensorium with  no motor or cerebellar deficits apparent.      CXR PA and Lateral:   06/08/2019 :    I personally reviewed images and agree with radiology impression as follows:   Stable mild cardiomegaly without overt pulmonary edema. No consolidative airspace disease to suggest a pneumonia.    Labs ordered/ reviewed:      Chemistry      Component Value Date/Time   NA 140 06/08/2019 1549   NA 140 05/27/2018 1125   K 3.9 06/08/2019 1549   CL 103 06/08/2019 1549   CO2 27 06/08/2019 1549   BUN 23 06/08/2019 1549   BUN 13 05/27/2018 1125   CREATININE 0.94 06/08/2019 1549   CREATININE 0.82 12/28/2015 1000      Component Value Date/Time   CALCIUM 9.4 06/08/2019 1549   ALKPHOS 102 05/27/2018 1125   AST 21 05/27/2018 1125   ALT 13 05/27/2018 1125   BILITOT 0.4 05/27/2018 1125        Lab Results  Component Value Date   WBC 7.4 06/08/2019   HGB 12.4 06/08/2019   HCT 37.1 06/08/2019   MCV 94.0 06/08/2019   PLT 226.0 06/08/2019       EOS                                                               0.2                                    06/08/2019       Lab Results  Component Value Date   TSH 3.25 06/08/2019     Lab Results  Component Value Date   PROBNP 264.0 (H) 04/27/2019       Lab Results  Component Value Date   ESRSEDRATE 36 (H) 06/08/2019   ESRSEDRATE 28 04/03/2016             Assessment:

## 2019-06-08 NOTE — Progress Notes (Signed)
Please let patient know repeat CXR showed no evidence of pneumonia. Stable mild cardiomegaly.

## 2019-06-08 NOTE — Patient Instructions (Addendum)
Symbicort 80 Take 2 puffs first thing in am and then another 2 puffs about 12 hours later.   Please remember to go to the lab department   for your tests - we will call you with the results when they are available.     Please remember to go to the  x-ray department on [address]   for your chest xray - we will call you with the results when they are available    Please schedule a follow up office visit in 2 weeks, sooner if needed  with all medications /inhalers/ solutions in hand so we can verify exactly what you are taking. This includes all medications from all doctors and over the counters.

## 2019-06-08 NOTE — Progress Notes (Signed)
You saw today

## 2019-06-09 ENCOUNTER — Encounter: Payer: Self-pay | Admitting: Internal Medicine

## 2019-06-09 LAB — CBC WITH DIFFERENTIAL/PLATELET
Basophils Absolute: 0.1 10*3/uL (ref 0.0–0.1)
Basophils Relative: 0.9 % (ref 0.0–3.0)
Eosinophils Absolute: 0.2 10*3/uL (ref 0.0–0.7)
Eosinophils Relative: 2 % (ref 0.0–5.0)
HCT: 37.1 % (ref 36.0–46.0)
Hemoglobin: 12.4 g/dL (ref 12.0–15.0)
Lymphocytes Relative: 14.7 % (ref 12.0–46.0)
Lymphs Abs: 1.1 10*3/uL (ref 0.7–4.0)
MCHC: 33.4 g/dL (ref 30.0–36.0)
MCV: 94 fl (ref 78.0–100.0)
Monocytes Absolute: 0.9 10*3/uL (ref 0.1–1.0)
Monocytes Relative: 12.2 % — ABNORMAL HIGH (ref 3.0–12.0)
Neutro Abs: 5.2 10*3/uL (ref 1.4–7.7)
Neutrophils Relative %: 70.2 % (ref 43.0–77.0)
Platelets: 226 10*3/uL (ref 150.0–400.0)
RBC: 3.94 Mil/uL (ref 3.87–5.11)
RDW: 15.6 % — ABNORMAL HIGH (ref 11.5–15.5)
WBC: 7.4 10*3/uL (ref 4.0–10.5)

## 2019-06-09 LAB — BASIC METABOLIC PANEL
BUN: 23 mg/dL (ref 6–23)
CO2: 27 mEq/L (ref 19–32)
Calcium: 9.4 mg/dL (ref 8.4–10.5)
Chloride: 103 mEq/L (ref 96–112)
Creatinine, Ser: 0.94 mg/dL (ref 0.40–1.20)
GFR: 58.21 mL/min — ABNORMAL LOW (ref >60.00)
Glucose, Bld: 90 mg/dL (ref 70–99)
Potassium: 3.9 mEq/L (ref 3.5–5.1)
Sodium: 140 mEq/L (ref 135–145)

## 2019-06-09 LAB — TSH: TSH: 3.25 u[IU]/mL (ref 0.35–4.50)

## 2019-06-09 LAB — SEDIMENTATION RATE: Sed Rate: 36 mm/hr — ABNORMAL HIGH (ref 0–30)

## 2019-06-09 NOTE — Assessment & Plan Note (Addendum)
Onset 2017 PFT's  03/21/2016  FEV1 1.50 (58 % ) ratio 80  p 14 % improvement from saba p nothing prior to study with DLCO  65/66c % corrects to 94  % for alv volume   - FENO 04/03/2016  =   7  Allergy profile 04/03/2016 >  Eos 0.1 /  IgE  17 neg RAST - PFT's  06/01/2016  FEV1 1.57 (61 % ) ratio 84  p 22 % improvement from saba p nothing prior to study with DLCO  57/56 % corrects to 87  % for alv volume  - Sinus CT 06/08/2016 > No evidence of sinusitis - trial of dysmista 08/30/2016 > resolved and did not recur off rx as of 12/11/2016  - PFT's  12/11/2016  FEV1 1.59 (63 % ) ratio 80  p 30 % improvement from saba p nothing prior to study with DLCO  61/60 % corrects to 103  % for alv volume  With erv 9% and classic obst curvature  - 12/11/2016    Try symb 160 2bid > worse cough  - 01/22/2017   try symb 80 2bid x 2 weeks> no change  - 04/25/2017  After extensive coaching inhaler device  effectiveness =    75% from a baseline of 25% so rec rechallenge with symb 80/dulera 100 2bid x one week then back off again    Will hold off additional abx and regroup in 2 week with all meds         Each maintenance medication was reviewed in detail including emphasizing most importantly the difference between maintenance and prns and under what circumstances the prns are to be triggered using an action plan format where appropriate.  Total time for H and P, chart review, counseling, teaching device,  directly observing portions of ambulatory 02 saturation study/  and generating customized AVS unique to this office visit / charting = 30 min

## 2019-06-09 NOTE — Assessment & Plan Note (Signed)
Amiodarone restarted 03/19/16  PFT's  03/21/2016  FEV1 1.50 (58 % ) ratio 80  p 14 % improvement from saba p nothing prior to study with DLCO  65/66c % corrects to 94  % for alv volume   - PFT's  06/01/2016  FEV1 1.57 (61 % ) ratio 84  p 22 % improvement from saba p nothing prior to study with DLCO  57/56 % corrects to 87  % for alv volume  - 08/30/2016  Walked RA x 3 laps @ 185 ft each stopped due to  End of study, fast pace, no sob or desat   - PFT's 12/11/2016  No change in dlco   - 6 mw 04/25/2017  = 430 m / no desats -06/08/2019   Walked RA x one lap =  approx 250 ft -@ slow  pace stopped due to She became SOB half-way through walk but was able to complete lap. Unable to complete 2nd lap due to SOB despite sats 92% at end   Symptoms are markedly disproportionate to objective findings and not clear to what extent this is actually a pulmonary  problem but pt does appear to have difficult to sort out respiratory symptoms of unknown origin for which  DDX  = almost all start with A and  include Adherence, Ace Inhibitors, Acid Reflux, Active Sinus Disease, Alpha 1 Antitripsin deficiency, Anxiety masquerading as Airways dz,  ABPA,  Allergy(esp in young), Aspiration (esp in elderly), Adverse effects of meds,  Active smoking or Vaping, A bunch of PE's/clot burden (a few small clots can't cause this syndrome unless there is already severe underlying pulm or vascular dz with poor reserve),  Anemia or thyroid disorder, plus two Bs  = Bronchiectasis and Beta blocker use..and one C= CHF     Adherence is always the initial "prime suspect" and is a multilayered concern that requires a "trust but verify" approach in every patient - starting with knowing how to use medications, especially inhalers, correctly, keeping up with refills and understanding the fundamental difference between maintenance and prns vs those medications only taken for a very short course and then stopped and not refilled.  -  - The proper method of  use, as well as anticipated side effects, of a metered-dose inhaler are discussed and demonstrated to the patient > restart symb 80 2bid  ? Allergy/ asthma > note note eos / just use low dose symbicort for now as high dose liable to cause cough   ? Adverse drug effects esp Amiodarone - note ESR trending up a bit so need to keep that in ddx  ? A bunch of PE"s > unlikely on coumadin  ? Anemia or thyroid issues > ruled out today   ? Anxiety/depression / deconditioning  > usually at the bottom of this list of usual suspects but should be  this pt's and note already on psychotropics and may interfere with adherence and also interpretation of response or lack thereof to symptom management which can be quite subjective.   ? CHF> note bnp intermediate range

## 2019-06-09 NOTE — Progress Notes (Signed)
Spoke with pt and notified of results per Dr. Wert. Pt verbalized understanding and denied any questions. 

## 2019-06-09 NOTE — Progress Notes (Signed)
Called and left a detailed msg on machine ok per [facility]

## 2019-06-09 NOTE — Assessment & Plan Note (Signed)
06/08/2019  Reports desats on cpap/02 but has not brought this to Dr Theodosia Blender attention > rec she do so ? Needs to start with cpap titration or refer to one of our sleep medicine doctors at Dr Theodosia Blender discretion

## 2019-06-15 ENCOUNTER — Ambulatory Visit (INDEPENDENT_AMBULATORY_CARE_PROVIDER_SITE_OTHER): Payer: Medicare Other | Admitting: Pharmacist

## 2019-06-15 ENCOUNTER — Other Ambulatory Visit: Payer: Self-pay

## 2019-06-15 DIAGNOSIS — Z5181 Encounter for therapeutic drug level monitoring: Secondary | ICD-10-CM | POA: Diagnosis not present

## 2019-06-15 DIAGNOSIS — I4819 Other persistent atrial fibrillation: Secondary | ICD-10-CM

## 2019-06-15 LAB — POCT INR: INR: 2.4 (ref 2.0–3.0)

## 2019-06-15 NOTE — Patient Instructions (Signed)
Description   Continue taking 4mg  daily except 2mg  on Sundays. Recheck INR in 4 weeks. Coumadin Clinic [phone]

## 2019-06-18 ENCOUNTER — Other Ambulatory Visit: Payer: Self-pay

## 2019-06-18 ENCOUNTER — Encounter: Payer: Self-pay | Admitting: Cardiology

## 2019-06-18 ENCOUNTER — Ambulatory Visit (INDEPENDENT_AMBULATORY_CARE_PROVIDER_SITE_OTHER): Payer: Medicare Other | Admitting: Cardiology

## 2019-06-18 VITALS — BP 118/74 | HR 87 | Ht 64.0 in | Wt 227.6 lb

## 2019-06-18 DIAGNOSIS — I10 Essential (primary) hypertension: Secondary | ICD-10-CM

## 2019-06-18 DIAGNOSIS — I340 Nonrheumatic mitral (valve) insufficiency: Secondary | ICD-10-CM | POA: Diagnosis not present

## 2019-06-18 DIAGNOSIS — I4821 Permanent atrial fibrillation: Secondary | ICD-10-CM | POA: Diagnosis not present

## 2019-06-18 DIAGNOSIS — I5032 Chronic diastolic (congestive) heart failure: Secondary | ICD-10-CM

## 2019-06-18 DIAGNOSIS — Z952 Presence of prosthetic heart valve: Secondary | ICD-10-CM

## 2019-06-18 DIAGNOSIS — I071 Rheumatic tricuspid insufficiency: Secondary | ICD-10-CM

## 2019-06-18 DIAGNOSIS — R0602 Shortness of breath: Secondary | ICD-10-CM | POA: Diagnosis not present

## 2019-06-18 NOTE — Progress Notes (Signed)
Cardiology Office Note:    Date:  06/18/2019   ID:  SKY MINIHAN, DOB 05-19-1945, MRN [id number]  PCP:  Maurice Small, MD  Cardiologist:  Buford Dresser, MD PhD  Referring MD: Maurice Small, MD   CC: follow up  History of Present Illness:    [name] is a 74 y.o. female with a hx ofhx of morbid obesity, mechanical AVR in 2006 by Dr. Prescott Gum, atrial fibrillation (see hx below), OSA on CPAPwho is seen for follow up.  History of atrial fibrillation: trialed on tikosyn with DCCV, did not maintain NSR. Amiodarone loaded, then DCCV, but did not maintain NSR, Had afib ablation at [facility], did not maintain, and then had repeat ablation 10/2015 at [facility], placed on amiodarone. Was in paroxysmal atrial fibrillation until around 2020. Monitor 10/2018 suggestive of 100% afib burden, though heart rate was largely regular. ECG in office 11/04/18 done and reviewed, thought to be sinus with long first degree block given the regularity. However, ECG done 06/2019 irregular and without clear P waves, suggestive of atrial fibrillation now being permanent.  Today: Remains extremely tired, can't do much without needing to sit and rest. Happens in spells where she feels like she needs to take a deep breath and rest. Not related to exertion. Happening 1-2 times/day. Has had ups and downs, felt better for a time when she was being active, then better again tapering off cymbalta. Now feels like she is not doing well again.  We discussed her afib at length today. Her monitor and prior ECG were difficult to determine--read as afib, but very regular. Question of ectopic rhythm vs. Sinus with first degree AV block. Today's ECG is irregular and more consistent with afib.   We reviewed her history of two prior afib ablations, attempt with dofetilide, and more recent years attempt at rhythm control with amiodarone. We discussed options today, including continuation of amiodarone, cardioversion, referral to EP.  Discussed differences in rate vs. Rhythm control and how we use medications. She does not want to attempt another ablation, and her prior cardioversions were not successful for long. We talked about pros/cons of each approach today. We reviewed her recent echo and the biatrial enlargement, how this affects the likelihood of long term sinus rhythm.  After shared decision making, we elected together to label this permanent afib and pursue a rate control strategy. Given this, we will discontinue amiodarone today.  Remains on CPAP. Her monitor shows that her O2 level drops intermittently into the 70s-80s at night. Has an appt set up with CPAP team coming soon to discuss what this means.  Saw Dr. Melvyn Novas in pulmonology 06/08/19, and has follow up appt coming up next week. I reviewed her last PFTs from 2018. Also noted to recently walk at pulm appt, sats remained 92% despite being unable to complete second walking lap in clinic.  Has a pulse ox at home, has not seen high heart rates. Most 70-90s.  Has been working with Dr. Justin Mend on her anxiety/depression. She feels that feeling bad makes her depression worse, not that the depression is making her feel bad.   Denies chest pain. No PND, orthopnea, change in chronic LE edema or unexpected weight gain. No syncope or feeling of palpitations.   Past Medical History:  Diagnosis Date  . Anxiety   . Aortic stenosis    status post aortic valve replacement with St. Jude mechanical prosthesis  . 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   41mm by echo 06/2016  and 30mm by CT angin 2016  . Chronic anticoagulation   . 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 Depression   . DJD (degenerative joint disease) of knee   . Dyslipidemia   . GERD (gastroesophageal reflux disease)    "several years ago; none since" (11/12/2012)  . Hyperlipidemia   . Hyperlipidemia LDL goal <70 01/19/2017  . Hypertension   . Left atrial enlargement   .  Mitral regurgitation 06/25/2016   Mild moderate MR by echo 06/2016  . Obesity   . Persistent atrial fibrillation (HCC)    s/p afib ablation x 2 at [facility]  . Sleep apnea    On CPAP at 12cm H2O  . 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 - now cleared by neurosurgery to maintain INR 2-2.5    Past Surgical History:  Procedure Laterality Date  . BURR HOLE FOR SUBDURAL HEMATOMA  2006  . CARDIAC VALVE REPLACEMENT  2006   St. Jude AVR  . CARDIOVERSION N/A 07/22/2012   Procedure: CARDIOVERSION;  Surgeon: [name], MD;  Location: [facility] ENDOSCOPY;  Service: Cardiovascular;  Laterality: N/A;  . CARDIOVERSION N/A 11/25/2012   Procedure: CARDIOVERSION;  Surgeon: [name], MD;  Location: [facility] ENDOSCOPY;  Service: Cardiovascular;  Laterality: N/A;  . CARDIOVERSION N/A 12/30/2012   Procedure: CARDIOVERSION;  Surgeon: [name], MD;  Location: [facility] ENDOSCOPY;  Service: Cardiovascular;  Laterality: N/A;  h&p in file-HW   . CARDIOVERSION N/A 03/30/2013   Procedure: CARDIOVERSION;  Surgeon: [name], MD;  Location: [facility] ENDOSCOPY;  Service: Cardiovascular;  Laterality: N/A;  . ELECTROPHYSIOLOGIC STUDY  11/2013   Afib ablation x 2 (11/2013 and 10/2015) at [facility] by Dr Clyda Hurdle with recurrence post ablation  . KNEE ARTHROSCOPY Left 1980's   "2" (11/12/2012)  . KNEE SURGERY Left 1980's   "after 2 scopes they went in and did some kind of OR" (11/12/2012)  . TEE WITHOUT CARDIOVERSION N/A 07/22/2012   Procedure: TRANSESOPHAGEAL ECHOCARDIOGRAM (TEE);  Surgeon: [name], MD;  Location: [facility] ENDOSCOPY;  Service: Cardiovascular;  Laterality: N/A;  Rm 2034  . TEE WITHOUT CARDIOVERSION N/A 10/07/2013   Procedure: TRANSESOPHAGEAL ECHOCARDIOGRAM (TEE);  Surgeon: [name], MD;  Location: [facility] ENDOSCOPY;  Service: Cardiovascular;  Laterality: N/A;  . TOTAL KNEE ARTHROPLASTY Left 11/05/2014   Procedure: TOTAL KNEE ARTHROPLASTY;  Surgeon: Dorna Leitz, MD;  Location: [place];  Service:  Orthopedics;  Laterality: Left;  . TUBAL LIGATION  1980's    Current Medications: Current Outpatient Medications on File Prior to Visit  Medication Sig  . ALPRAZolam (XANAX) 0.5 MG tablet Take 0.5 mg by mouth 2 (two) times daily as needed. AS NEEDED FOR ANXIETY  . Ascorbic Acid (VITAMIN C) 1000 MG tablet Take 2,000 mg by mouth daily.  . budesonide-formoterol (SYMBICORT) 80-4.5 MCG/ACT inhaler Inhale 2 puffs into the lungs 2 (two) times daily.  . calcium-vitamin D (OSCAL WITH D) 500-200 MG-UNIT per tablet Take 2 tablets by mouth daily.   . Cholecalciferol (VITAMIN D) 2000 UNITS tablet Take 4,000 Units by mouth daily.  Marland Kitchen DILT-XR 240 MG 24 hr capsule TAKE 1 CAPSULE BY MOUTH EVERY MORNING ON AN EMPTY STOMACH  . furosemide (LASIX) 80 MG tablet Take 1 tablet (80 mg total) by mouth daily. May take an additional tablet as needed for swelling  . levothyroxine (SYNTHROID, LEVOTHROID) 100 MCG tablet Take 100 mcg daily before breakfast by mouth.  . losartan (  COZAAR) 100 MG tablet Take 1 tablet (100 mg total) by mouth daily.  . Multiple Vitamin (MULTIVITAMIN WITH MINERALS) TABS Take 1 tablet by mouth daily.  . potassium chloride SA (KLOR-CON) 20 MEQ tablet TAKE 2 TABLETS BY MOUTH EVERY MORNING AND 1 TABLET EVERY EVENING  . pravastatin (PRAVACHOL) 20 MG tablet TAKE 1 TABLET BY MOUTH EVERY DAY  . sertraline (ZOLOFT) 50 MG tablet Take 100 mg by mouth daily. Pt takes 75 mg daily   . tretinoin (RETIN-A) 0.05 % cream Apply 1 application topically at bedtime.   Marland Kitchen warfarin (COUMADIN) 4 MG tablet Take 1/2 to 1 tablet daily as directed by Coumadin clinic  . zolpidem (AMBIEN) 10 MG tablet Take 5 mg by mouth at bedtime as needed for sleep.   No current facility-administered medications on file prior to visit.     Allergies:   Codeine, Beta adrenergic blockers, Metoprolol, Propoxyphene, Toprol xl [metoprolol tartrate], and Dilantin [phenytoin sodium extended]   Social History   Tobacco Use  . Smoking status:  Never Smoker  . Smokeless tobacco: Never Used  Substance Use Topics  . Alcohol use: No    Comment: 11/12/2012 "no alcohol in the last 9 years"  . Drug use: No    Family History: The patient's family history includes Heart disease in her brother; Hyperlipidemia in her father; Hypertension in her father and mother; Lung cancer in her mother.  ROS:   Please see the history of present illness.  Additional pertinent ROS otherwise unremarkable except as noted in HPI.    EKGs/Labs/Other Studies Reviewed:    The following studies were reviewed today: echo 11/12/18:  1. The left ventricle has low normal systolic function, with an ejection fraction of 50-55%. The cavity size was normal. Left ventricular diastolic function could not be evaluated  2. The right ventricle has moderately reduced systolic function. The cavity was mildly enlarged. There is no increase in right ventricular wall thickness.  3. Left atrial size was severely dilated.  4. Right atrial size was severely dilated.  5. The mitral valve is grossly normal. There is moderate mitral annular calcification present.  6. The tricuspid valve is grossly normal. Tricuspid valve regurgitation is severe.  7. The observed AV gradient is likely normal for this mechanical valve.  8. The aorta is abnormal in size and structure.  9. There is moderate dilatation of the ascending aorta. 10. The inferior vena cava was dilated in size with <50% respiratory variability. 11. The interatrial septum was not assessed.  Monitor results, prior echoes also reviewed today  EKG:  EKG is personally reviewed.  The ekg ordered today demonstrates atrial fibrillation at 87 bpm.  Recent Labs: 04/27/2019: Pro B Natriuretic peptide (BNP) 264.0 06/08/2019: BUN 23; Creatinine, Ser 0.94; Hemoglobin 12.4; Platelets 226.0; Potassium 3.9; Sodium 140; TSH 3.25  Recent Lipid Panel    Component Value Date/Time   CHOL 175 05/27/2018 1125   TRIG 100 05/27/2018 1125   HDL  71 05/27/2018 1125   CHOLHDL 2.5 05/27/2018 1125   LDLCALC 84 05/27/2018 1125    Physical Exam:    VS:  BP 118/74   Pulse 87   Ht 5\' 4"  (1.626 m)   Wt 227 lb 9.6 oz (103.2 kg)   SpO2 94%   BMI 39.07 kg/m     Wt Readings from Last 3 Encounters:  06/18/19 227 lb 9.6 oz (103.2 kg)  06/08/19 232 lb (105.2 kg)  12/18/18 230 lb 3.2 oz (104.4 kg)    GEN:  Well nourished, well developed in no acute distress HEENT: Normal, moist mucous membranes NECK: No sustained JVD, visible TR wave just above clavicle sitting upright CARDIAC: irregular rhythm, normal S1, crisp metallic S2, no rubs or gallops. 2/6 SM at sternal border VASCULAR: Radial and DP pulses 2+ bilaterally. No carotid bruits RESPIRATORY:  Clear to auscultation without rales, wheezing or rhonchi  ABDOMEN: Soft, non-tender, non-distended MUSCULOSKELETAL:  Ambulates independently SKIN: Warm and dry, trace bilateral LE edema NEUROLOGIC:  Alert and oriented x 3. No focal neuro deficits noted. PSYCHIATRIC:  Normal affect   ASSESSMENT:    1. SOB (shortness of breath)   2. Permanent atrial fibrillation ([place])   3. Chronic diastolic CHF (congestive heart failure) ([place])   4. Essential hypertension   5. Nonrheumatic mitral valve regurgitation   6. Severe tricuspid regurgitation   7. H/O mechanical aortic valve replacement    PLAN:    Chronic diastolic heart failure, mitral regurgitation, tricuspid regurgitation with shortness of breath, fatigue: mild trace LE edema, +TR but no sustained JVD on exam, weights stable at home suggests nearly euvolemic -LV EF normal, on my review has abnormal diastolic function; severe biatrial enlargement, severe TR, normal gradient across mechanical aortic valve -unclear how much of this is playing a role in her symptoms. Her fatigue/shortness of breath tends to be up and down.  -there is no increase in LV wall thickness, but severe biatrial enlargement raises questions of amyloid.   Permanent atrial  fibrillation: on coumadin for mechanical valve. Chadsvasc=4 -has extensive treatment history, see prior notes.  -see HPI, we had an extended conversation about this today. She has been rate controlled, and her symptoms trend up and down even when her heart rate is unchanged. May be a partial component of afib, but suggests this is not the entire picture -we discussed multiple avenues today. After shared decision making, we elected to label her afib as permanent and will aim for a rate control strategy -given this, stopping amiodarone today -alerted coumadin clinic to this, as her INR/coumadin dose may need to be monitored more closely -may need to increase diltiazem if heart rate rises off amiodarone  Mechanical aortic valve -follows with coumadin clinic -stable gradient on echo  Hypertension: at goal today -continue diltiazem 240 mg daily, furosemide 80 mg daily (takes a second dose about every other day), losartan 100 mg  Stable ascending aortic aneurysm: 4.4 2018-2019.  OSA: continue CPAP, she has upcoming follow up to discuss her desaturations at night  Cardiac risk counseling and prevention recommendations: -recommend heart healthy/Mediterranean diet, with whole grains, fruits, vegetable, fish, lean meats, nuts, and olive oil. Limit salt. -recommend moderate walking, 3-5 times/week for 30-50 minutes each session. Aim for at least 150 minutes.week. Goal should be pace of 3 miles/hours, or walking 1.5 miles in 30 minutes -recommend avoidance of tobacco products. Avoid excess alcohol. -continue pravastatin for elevated ASCVD risk -ASCVD risk score: The 10-year ASCVD risk score Mikey Bussing DC Brooke Bonito., et al., 2013) is: 16%   Values used to calculate the score:     Age: 66 years     Sex: Female     Is Non-Hispanic African American: No     Diabetic: No     Tobacco smoker: No     Systolic Blood Pressure: 123456 mmHg     Is BP treated: Yes     HDL Cholesterol: 71 mg/dL     Total Cholesterol: 175  mg/dL    Plan for follow up: 6 weeks to monitor  symptoms  Total time of encounter: 54 minutes total time of encounter, including 44 minutes spent in face-to-face patient care. This time includes coordination of care and counseling regarding atrial fibrillation, see HPI. Remainder of non-face-to-face time involved reviewing chart documents/testing relevant to the patient encounter and documentation in the medical record.  Buford Dresser, MD, PhD [place]  [facility]   Medication Adjustments/Labs and Tests Ordered: Current medicines are reviewed at length with the patient today.  Concerns regarding medicines are outlined above.  Orders Placed This Encounter  Procedures  . EKG 12-Lead   No orders of the defined types were placed in this encounter.   Patient Instructions  Medication Instructions:  Stop Amiodarone 200 mg daily  *If you need a refill on your cardiac medications before your next appointment, please call your pharmacy*   Lab Work: None   Testing/Procedures: None   Follow-Up: At [facility], you and your health needs are our priority.  As part of our continuing mission to provide you with exceptional heart care, we have created designated Provider Care Teams.  These Care Teams include your primary Cardiologist (physician) and Advanced Practice Providers (APPs -  Physician Assistants and Nurse Practitioners) who all work together to provide you with the care you need, when you need it.  We recommend signing up for the patient portal called "MyChart".  Sign up information is provided on this After Visit Summary.  MyChart is used to connect with patients for Virtual Visits (Telemedicine).  Patients are able to view lab/test results, encounter notes, upcoming appointments, etc.  Non-urgent messages can be sent to your provider as well.   To learn more about what you can do with MyChart, go to [email or web address].    Your next appointment:   6  week(s)  The format for your next appointment:   In Person  Provider:   Buford Dresser, MD   Signed, Buford Dresser, MD PhD 06/18/19 [place]

## 2019-06-18 NOTE — Patient Instructions (Signed)
Medication Instructions:  Stop Amiodarone 200 mg daily  *If you need a refill on your cardiac medications before your next appointment, please call your pharmacy*   Lab Work: None   Testing/Procedures: None   Follow-Up: At [facility], you and your health needs are our priority.  As part of our continuing mission to provide you with exceptional heart care, we have created designated Provider Care Teams.  These Care Teams include your primary Cardiologist (physician) and Advanced Practice Providers (APPs -  Physician Assistants and Nurse Practitioners) who all work together to provide you with the care you need, when you need it.  We recommend signing up for the patient portal called "MyChart".  Sign up information is provided on this After Visit Summary.  MyChart is used to connect with patients for Virtual Visits (Telemedicine).  Patients are able to view lab/test results, encounter notes, upcoming appointments, etc.  Non-urgent messages can be sent to your provider as well.   To learn more about what you can do with MyChart, go to [email or web address].    Your next appointment:   6 week(s)  The format for your next appointment:   In Person  Provider:   Buford Dresser, MD

## 2019-06-22 ENCOUNTER — Ambulatory Visit: Payer: Medicare Other | Admitting: Internal Medicine

## 2019-06-22 DIAGNOSIS — J339 Nasal polyp, unspecified: Secondary | ICD-10-CM | POA: Diagnosis not present

## 2019-06-22 DIAGNOSIS — R040 Epistaxis: Secondary | ICD-10-CM | POA: Diagnosis not present

## 2019-06-23 ENCOUNTER — Other Ambulatory Visit: Payer: Self-pay

## 2019-06-23 ENCOUNTER — Encounter: Payer: Self-pay | Admitting: Internal Medicine

## 2019-06-23 ENCOUNTER — Ambulatory Visit (INDEPENDENT_AMBULATORY_CARE_PROVIDER_SITE_OTHER): Payer: Medicare Other | Admitting: Internal Medicine

## 2019-06-23 DIAGNOSIS — R0609 Other forms of dyspnea: Secondary | ICD-10-CM

## 2019-06-23 DIAGNOSIS — J45991 Cough variant asthma: Secondary | ICD-10-CM

## 2019-06-23 DIAGNOSIS — R0600 Dyspnea, unspecified: Secondary | ICD-10-CM

## 2019-06-23 MED ORDER — AMOXICILLIN-POT CLAVULANATE 875-125 MG PO TABS: 1.0000 | ORAL_TABLET | Freq: Two times a day (BID) | ORAL | 0 refills | Status: AC

## 2019-06-23 MED ORDER — PREDNISONE 10 MG PO TABS: ORAL_TABLET | ORAL | 0 refills | Status: DC

## 2019-06-23 NOTE — Progress Notes (Signed)
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 Pulmonary Consult    Dr. Rayann Heman sent pt here, PFT showed advanced lung disease, pt states is fatigued and SOB  on best best days can walk the neighborhood x 7-8 min includes some hills for the first time as previously lived in [place] flatter neighborhood and ever since new walk has noted more sob, but only on the hills Sleeps with cpap feeling fine/ [name] Each am wakes up with cough productive mucus is yellowy green x "a while" =  months / Not years assoc with nasal congestion but no wheezing  rec Augmentin 875 mg take one pill twice daily  X 10 days  Need to let the coumadin clinic know you are on this medication Ocean nasal spray as much as you want     06/01/2016  f/u ov/[name] re: still on amiodarone Chief Complaint  Patient presents with  . Follow-up    PFT's done today. Breathing is "fine" has good days and bad days. She c/o nasal congestion recently.   can walk 10 min around neighborhood x some steep hills slow her down but never has to stop  No more bad days since first of the year / feels like afib better and that's what is making the difference  Main concern is variable nasal congestion and sense of pnds x months, worse at hs and in am assoc with cough Has tried otcs no better rec CT sinus 06/08/16 > neg / allergy profile neg 04/03/16     08/30/2016  f/u ov/[name] re: amiodarone  surveillance/ rhinitis with tendency to uacs  Chief Complaint  Patient presents with  . Follow-up    Pt c/o constant nasal congestion. She also c/o non prod cough for the past several wks.    ex tol varies with no pattern and on best days can still do ex fine  Nasal congestion started end of  2017 / better with afrin worse at hs but thinks becoming addicted to it  rec Dymista one twice daily until use up sample then if better       12/11/2016  f/u ov/[name] re:   Asthma/ rhinitis neg allergy eval  Chief Complaint  Patient presents with  . Follow-up    PFT done today, still SOB with exertion  continues with variable ex tol s specific pattern she is aware of  Nasal symptoms resolved p took dymista and did not refill and they have not recurred s need for otcs  Has never used saba before   rec Try symicort 160 Take 2 puffs first thing in am and then another 2 puffs about 12 hours later.  Work on inhaler technique:  relax and gently blow all the way out then take a nice smooth deep breath back in, triggering the inhaler at same time you start  breathing in.  Hold for up to 5 seconds if you can. Blow out thru [name]. Rinse and gargle with water when done   01/22/2017  f/u ov/[name] re:  Doe on amio/ no better on symb 160 and now coughing  Chief Complaint  Patient presents with  . Follow-up    She states Symbicort made her cough more and so she has stopped taking med every day. She was given an albuterol inhaler by her PCP and uses this maybe once per wk on average.   walks dogs x 20 min with fine flat surface but some sob on hills for about about a min and no better on symb but hfa poor housework also makes her sob / saba no better  tsh high and thyroid being ajusted up by pcp  Coughs non productive only p symbicort 160 and did not cough like this prev/ no assoc pnds as before  rec Symbicort 80  Take 2 puffs first thing in am and then another 2 puffs about 12 hours later.  Work on inhaler  technique:      04/25/2017  f/u ov/[name] re:  Doe on amio / poor hfa / 02 and cpap per Dr Radford Pax Chief Complaint  Patient presents with  . Follow-up    6MW test done today. She is not coughing any less and states her breathing is unchanged. She states she does not have an albuterol inhaler.   still sob with vacuuming/ not sure syb 80 helping but hfa still poor  rec  Monitor your 02 saturation toward the end of your exertion Work on inhaler technique:  Take the dulera 100 = symb 80 Take 2 puffs first thing in am and then another 2 puffs about 12 hours later and they try off to see if any difference in either your cough or breathing and if either is worse start back on it    04/24/19 NP eval  Pleasure speaking with you today [name] Recommendations: Take Symbicort 2 puffs twice daily for the next 1 to 2 weeks Use Ocean nasal spray daily at bedtime If notice increase in weight or leg swelling please take additional Lasix Chest x-ray re: shortness of breath and cough Labs (CBC with differential, BNP, BMET) rez zpak /lasix    06/08/2019  f/u ov/[name] re: worse breathing/ coughing x 12/2018 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 Follow-up    Pt c/o cough with yellow, occ bloody sputum in the mornings. She also c/o SOB and wheezing.   Dyspnea:  Very sedentary  Cough: x 2 -3 m esp first thing in am described above / min vol/ min heme on coumadin s epistaxis  Sleeping: on cpap/ 3.5 lpm and desats on this SABA use: none  02: only using at hs x 2lpm / not monitoring sats with activity as rec  rec Symbicort 80 Take 2 puffs first thing in am and then another 2 puffs about 12 hours later.     Stopped amiodarone on 06/16/19 and could not tell any difference with symbicort    06/23/2019  f/u ov/[name] re:  Cough variant asthma vs uacs Chief Complaint  Patient presents with  . Follow-up    Breathing is worse since the last  visit "every time I walk across the room". She also c/o cough with yellow to green sputum every am.   Dyspnea: gradually worse since last ov assoc with nasal  congestion  Cough: worse in am's / no more [name] bleeds and no purulent secretions  Sleeping: on cpap / 3.5 lpm but low sats on 2 pillows, [place] eval pending  SABA use: none 02: not using x at hs   No obvious patterns in day to day or daytime variability or assoc excess/ purulent sputum or mucus plugs or hemoptysis or cp or chest tightness, subjective wheeze or overt sinus or hb symptoms.   Feels sleeps fine without nocturnal  or early am exacerbation  of respiratory  c/o's or need for noct saba. Also denies any obvious fluctuation of symptoms with weather or environmental changes or other aggravating or alleviating factors except as outlined above   No unusual exposure hx or h/o childhood pna/ asthma or knowledge of premature birth.  Current Allergies, Complete Past Medical History, Past Surgical History, Family History, and Social History were reviewed in [employer] record.  ROS  The following are not active complaints unless bolded Hoarseness, sore throat, dysphagia, dental problems, itching, sneezing,  nasal congestion or discharge of excess mucus or purulent secretions, ear ache,   fever, chills, sweats, unintended wt loss or wt gain, classically pleuritic or exertional cp,  orthopnea pnd or arm/hand swelling  or leg swelling, presyncope, palpitations, abdominal pain, anorexia, nausea, vomiting, diarrhea  or change in bowel habits or change in bladder habits, change in stools or change in urine, dysuria, hematuria,  rash, arthralgias, visual complaints, headache, numbness, weakness or ataxia or problems with walking or coordination,  change in mood or  memory.        Current Meds  Medication Sig  . ALPRAZolam (XANAX) 0.5 MG tablet Take 0.5 mg by mouth 2 (two) times daily as needed. AS NEEDED FOR ANXIETY  .  budesonide-formoterol (SYMBICORT) 80-4.5 MCG/ACT inhaler Inhale 2 puffs into the lungs 2 (two) times daily.  . calcium-vitamin D (OSCAL WITH D) 500-200 MG-UNIT per tablet Take 2 tablets by mouth daily.   . Cholecalciferol (VITAMIN D) 2000 UNITS tablet Take 4,000 Units by mouth daily.  Marland Kitchen DILT-XR 240 MG 24 hr capsule TAKE 1 CAPSULE BY MOUTH EVERY MORNING ON AN EMPTY STOMACH  . ferrous sulfate 325 (65 FE) MG tablet Take 325 mg by mouth daily with breakfast.  . furosemide (LASIX) 80 MG tablet Take 1 tablet (80 mg total) by mouth daily. May take an additional tablet as needed for swelling  . levothyroxine (SYNTHROID, LEVOTHROID) 100 MCG tablet Take 100 mcg daily before breakfast by mouth.  . losartan (COZAAR) 100 MG tablet Take 1 tablet (100 mg total) by mouth daily.  . Multiple Vitamin (MULTIVITAMIN WITH MINERALS) TABS Take 1 tablet by mouth daily.  . potassium chloride SA (KLOR-CON) 20 MEQ tablet TAKE 2 TABLETS BY MOUTH EVERY MORNING AND 1 TABLET EVERY EVENING  . pravastatin (PRAVACHOL) 20 MG tablet TAKE 1 TABLET BY MOUTH EVERY DAY  . sertraline (ZOLOFT) 50 MG tablet Take 100 mg by mouth daily. Pt takes 75 mg daily   . tretinoin (RETIN-A) 0.05 % cream Apply 1 application topically at bedtime.   Marland Kitchen warfarin (COUMADIN) 4 MG tablet Take 1/2 to 1 tablet daily as directed by Coumadin clinic  . zolpidem (AMBIEN) 10 MG tablet Take 5 mg by mouth at bedtime as needed for sleep.                    Objective:   Physical Exam  amb obese wf nad   06/23/2019  228 06/08/2019         232 04/25/2017       231  01/22/2017     237  12/11/2016         239  08/30/2016       235  06/01/2016       235   04/03/16 233 lb 6.4 oz (105.9 kg)  03/19/16 240 lb 12.8 oz (109.2 kg)  12/28/15 229 lb 6.4 oz (104.1 kg)    Vital signs reviewed  06/23/2019  - Note at rest 02 sats  96% on RA       HEENT : pt wearing mask not removed for exam due to covid -19 concerns.    NECK :  without JVD/Nodes/TM/ nl carotid  upstrokes bilaterally   LUNGS: no acc muscle use,  Nl contour chest which is clear to A and P bilaterally without cough on insp or exp maneuvers   CV:  RRR  no s3 or murmur or increase in P2, and trace LE  edema   ABD:  soft and nontender with nl inspiratory excursion in the supine position. No bruits or organomegaly appreciated, bowel sounds nl  MS:  Nl gait/ ext warm without deformities, calf tenderness, cyanosis or clubbing No obvious joint restrictions   SKIN: warm and dry without lesions    NEURO:  alert, approp, nl sensorium with  no motor or cerebellar deficits apparent.             I personally reviewed images and agree with radiology impression as follows:  CXR:  PA and Lat  06/08/19 Stable mild cardiomegaly without overt pulmonary edema. No consolidative airspace disease to suggest a pneumonia.             Assessment:

## 2019-06-23 NOTE — Patient Instructions (Signed)
Stay on symbicort 80 Take 2 puffs first thing in am and then another 2 puffs about 12 hours later.   Work on inhaler technique:  relax and gently blow all the way out then take a nice smooth deep breath back in, triggering the inhaler at same time you start breathing in.  Hold for up to 5 seconds if you can. Blow out thru nose. Rinse and gargle with water when done - remember the golfer warming up and taking practice swings   Prednisone 10 mg take  4 each am x 2 days,   2 each am x 2 days,  1 each am x 2 days and stop   Augmentin 875 mg take one pill twice daily  X 10 days - take at breakfast and supper with large glass of water.  It would help reduce the usual side effects (diarrhea and yeast infections) if you ate cultured yogurt at lunch.    Please schedule a follow up office visit in 6 weeks, call sooner if needed - bring any inhalers with you

## 2019-06-25 ENCOUNTER — Encounter: Payer: Self-pay | Admitting: Internal Medicine

## 2019-06-25 NOTE — Assessment & Plan Note (Signed)
Amiodarone restarted 03/19/16  PFT's  03/21/2016  FEV1 1.50 (58 % ) ratio 80  p 14 % improvement from saba p nothing prior to study with DLCO  65/66c % corrects to 94  % for alv volume   - PFT's  06/01/2016  FEV1 1.57 (61 % ) ratio 84  p 22 % improvement from saba p nothing prior to study with DLCO  57/56 % corrects to 87  % for alv volume  - 08/30/2016  Walked RA x 3 laps @ 185 ft each stopped due to  End of study, fast pace, no sob or desat   - PFT's 12/11/2016  No change in dlco   - 6 mw 04/25/2017  = 430 m / no desats -06/08/2019   Walked RA x one lap =  approx 250 ft -@ slow  pace stopped due to She became SOB half-way through walk but was able to complete lap. Unable to complete 2nd lap due to SOB despite sats 92% at end  Stopped amiodarone on 06/16/19   Not clear whether amiodarone contributing to sob but apparently not improving afib so stopped.  Given pred x 6 days only as no def evidence of amio toxicity here.

## 2019-06-25 NOTE — Assessment & Plan Note (Addendum)
Onset 2017 PFT's  03/21/2016  FEV1 1.50 (58 % ) ratio 80  p 14 % improvement from saba p nothing prior to study with DLCO  65/66c % corrects to 94  % for alv volume   - FENO 04/03/2016  =   7  Allergy profile 04/03/2016 >  Eos 0.1 /  IgE  17 neg RAST - PFT's  06/01/2016  FEV1 1.57 (61 % ) ratio 84  p 22 % improvement from saba p nothing prior to study with DLCO  57/56 % corrects to 87  % for alv volume  - Sinus CT 06/08/2016 > No evidence of sinusitis - trial of dysmista 08/30/2016 > resolved and did not recur off rx as of 12/11/2016  - PFT's  12/11/2016  FEV1 1.59 (63 % ) ratio 80  p 30 % improvement from saba p nothing prior to study with DLCO  61/60 % corrects to 103  % for alv volume  With erv 9% and classic obst curvature  - 12/11/2016    Try symb 160 2bid > worse cough  - 01/22/2017   try symb 80 2bid x 2 weeks> no change  - 06/23/2019  After extensive coaching inhaler device,  effectiveness =    75% from a baseline of close to 0   I doubt she has much asthma with such poor hfa and most of her symptoms today appear to be upper resp in nature so no need to change rx for asthma with low dose ics/laba but work harder on technique and empirically rx for rhinitis/ sinusitis with augmeintin/ pred and f/u if not better.          Each maintenance medication was reviewed in detail including emphasizing most importantly the difference between maintenance and prns and under what circumstances the prns are to be triggered using an action plan format where appropriate.  Total time for H and P, chart review, counseling,   and generating customized AVS unique to this office visit / charting = 20 min

## 2019-07-14 ENCOUNTER — Other Ambulatory Visit: Payer: Self-pay

## 2019-07-14 ENCOUNTER — Ambulatory Visit (INDEPENDENT_AMBULATORY_CARE_PROVIDER_SITE_OTHER): Payer: Medicare Other | Admitting: *Deleted

## 2019-07-14 DIAGNOSIS — Z5181 Encounter for therapeutic drug level monitoring: Secondary | ICD-10-CM | POA: Diagnosis not present

## 2019-07-14 DIAGNOSIS — I4819 Other persistent atrial fibrillation: Secondary | ICD-10-CM | POA: Diagnosis not present

## 2019-07-14 DIAGNOSIS — I480 Paroxysmal atrial fibrillation: Secondary | ICD-10-CM

## 2019-07-14 LAB — POCT INR: INR: 2.6 (ref 2.0–3.0)

## 2019-07-14 NOTE — Patient Instructions (Addendum)
Description   Today take 2mg  then continue taking 4mg  daily except 2mg  on Sundays. Recheck INR in 3 weeks with MD appt at [facility]. Coumadin Clinic [phone]

## 2019-07-15 DIAGNOSIS — E669 Obesity, unspecified: Secondary | ICD-10-CM | POA: Diagnosis not present

## 2019-07-15 DIAGNOSIS — G4733 Obstructive sleep apnea (adult) (pediatric): Secondary | ICD-10-CM | POA: Diagnosis not present

## 2019-07-18 DIAGNOSIS — G4733 Obstructive sleep apnea (adult) (pediatric): Secondary | ICD-10-CM | POA: Diagnosis not present

## 2019-07-28 DIAGNOSIS — H2513 Age-related nuclear cataract, bilateral: Secondary | ICD-10-CM | POA: Diagnosis not present

## 2019-07-30 ENCOUNTER — Other Ambulatory Visit: Payer: Self-pay | Admitting: Internal Medicine

## 2019-07-30 MED ORDER — BUDESONIDE-FORMOTEROL FUMARATE 80-4.5 MCG/ACT IN AERO: 2.0000 | INHALATION_SPRAY | Freq: Two times a day (BID) | RESPIRATORY_TRACT | 11 refills | Status: DC

## 2019-08-03 ENCOUNTER — Ambulatory Visit (INDEPENDENT_AMBULATORY_CARE_PROVIDER_SITE_OTHER): Payer: Medicare Other | Admitting: Pharmacist Clinician (PhC)/ Clinical Pharmacy Specialist

## 2019-08-03 ENCOUNTER — Encounter: Payer: Self-pay | Admitting: Cardiology

## 2019-08-03 ENCOUNTER — Ambulatory Visit (INDEPENDENT_AMBULATORY_CARE_PROVIDER_SITE_OTHER): Payer: Medicare Other | Admitting: Cardiology

## 2019-08-03 ENCOUNTER — Other Ambulatory Visit: Payer: Self-pay

## 2019-08-03 VITALS — BP 130/80 | HR 88 | Temp 96.3°F | Ht 67.0 in | Wt 231.6 lb

## 2019-08-03 DIAGNOSIS — Z952 Presence of prosthetic heart valve: Secondary | ICD-10-CM

## 2019-08-03 DIAGNOSIS — R079 Chest pain, unspecified: Secondary | ICD-10-CM | POA: Diagnosis not present

## 2019-08-03 DIAGNOSIS — I5032 Chronic diastolic (congestive) heart failure: Secondary | ICD-10-CM | POA: Diagnosis not present

## 2019-08-03 DIAGNOSIS — I10 Essential (primary) hypertension: Secondary | ICD-10-CM | POA: Diagnosis not present

## 2019-08-03 DIAGNOSIS — R0602 Shortness of breath: Secondary | ICD-10-CM | POA: Diagnosis not present

## 2019-08-03 DIAGNOSIS — Z5181 Encounter for therapeutic drug level monitoring: Secondary | ICD-10-CM | POA: Diagnosis not present

## 2019-08-03 DIAGNOSIS — I4821 Permanent atrial fibrillation: Secondary | ICD-10-CM | POA: Diagnosis not present

## 2019-08-03 DIAGNOSIS — I071 Rheumatic tricuspid insufficiency: Secondary | ICD-10-CM | POA: Diagnosis not present

## 2019-08-03 DIAGNOSIS — I340 Nonrheumatic mitral (valve) insufficiency: Secondary | ICD-10-CM

## 2019-08-03 DIAGNOSIS — I480 Paroxysmal atrial fibrillation: Secondary | ICD-10-CM

## 2019-08-03 DIAGNOSIS — I4819 Other persistent atrial fibrillation: Secondary | ICD-10-CM | POA: Diagnosis not present

## 2019-08-03 LAB — POCT INR: INR: 2.6 (ref 2.0–3.0)

## 2019-08-03 NOTE — Patient Instructions (Signed)
Medication Instructions:  Your Physician recommend you continue on your current medication as directed.    *If you need a refill on your cardiac medications before your next appointment, please call your pharmacy*   Lab Work: None   Testing/Procedures: Your physician has requested that you have a lexiscan myoview. For further information please visit [email or web address]. Please follow instruction sheet, as given. [place] [address]    Follow-Up: At [facility], you and your health needs are our priority.  As part of our continuing mission to provide you with exceptional heart care, we have created designated Provider Care Teams.  These Care Teams include your primary Cardiologist (physician) and Advanced Practice Providers (APPs -  Physician Assistants and Nurse Practitioners) who all work together to provide you with the care you need, when you need it.  We recommend signing up for the patient portal called "MyChart".  Sign up information is provided on this After Visit Summary.  MyChart is used to connect with patients for Virtual Visits (Telemedicine).  Patients are able to view lab/test results, encounter notes, upcoming appointments, etc.  Non-urgent messages can be sent to your provider as well.   To learn more about what you can do with MyChart, go to [email or web address].    Your next appointment:   6 week(s)  The format for your next appointment:   In Person  Provider:   Buford Dresser, MD  You are scheduled for a Myocardial Perfusion Imaging Study on.  Please arrive 15 minutes prior to your appointment time for registration and insurance purposes.  The test will take approximately 3 to 4 hours to complete; you may bring reading material.  If someone comes with you to your appointment, they will need to remain in the main lobby due to limited space in the testing area. **If you are pregnant or breastfeeding, please notify the nuclear lab prior to  your appointment**  How to prepare for your Myocardial Perfusion Test: . Do not eat or drink 3 hours prior to your test, except you may have water. . Do not consume products containing caffeine (regular or decaffeinated) 12 hours prior to your test. (ex: coffee, chocolate, sodas, tea). . Do bring a list of your current medications with you.  If not listed below, you may take your medications as normal. . Do wear comfortable clothes (no dresses or overalls) and walking shoes, tennis shoes preferred (No heels or open toe shoes are allowed). . Do NOT wear cologne, perfume, aftershave, or lotions (deodorant is allowed). . If these instructions are not followed, your test will have to be rescheduled.  Please report to [address], [address] for your test.  If you have questions or concerns about your appointment, you can call the Nuclear Lab at [phone].  If you cannot keep your appointment, please provide 24 hours notification to the Nuclear Lab, to avoid a possible $50 charge to your account.

## 2019-08-03 NOTE — Progress Notes (Signed)
Cardiology Office Note:    Date:  08/03/2019   ID:  [name], DOB November 25, 1945, MRN [id number]  PCP:  Maurice Small, MD  Cardiologist:  Buford Dresser, MD PhD  Referring MD: Maurice Small, MD   CC: follow up  History of Present Illness:    [name] is a 74 y.o. female with a hx ofhx of morbid obesity, mechanical AVR in 2006 by Dr. Prescott Gum, atrial fibrillation (see hx below), OSA on CPAPwho is seen for follow up.  History of atrial fibrillation: trialed on tikosyn with DCCV, did not maintain NSR. Amiodarone loaded, then DCCV, but did not maintain NSR, Had afib ablation at [facility], did not maintain, and then had repeat ablation 10/2015 at [facility], placed on amiodarone. Was in paroxysmal atrial fibrillation until around 2020. Monitor 10/2018 suggestive of 100% afib burden, though heart rate was largely regular. ECG in office 11/04/18 done and reviewed, thought to be sinus with long first degree block given the regularity. However, ECG done 06/2019 irregular and without clear P waves, suggestive of atrial fibrillation now being permanent.  Today: Wakes up every morning, feels like her breathing is poor. Can't walk to the mailbox without getting short of breath. Makes her upset and stressed. Notes wheezing with this. Has occasional good days, but these are the minority of the time. On a new inhaler from her pulmonologist, but hasn't felt better. Was treated with steroids/antibiotics recently without improvement.   Got new CPAP, which also hasn't changed her symptoms. Notes that at night her O2 drops into the low 80s on her home pulse ox.   Having mild chest tightness in the office today. ECG nonacute. She feels this many times throughout the day, most noticeable in the evening watching TV, feels like "I wonder if I will wake up tomorrow." Notices more when she has time to think about it. Tearful discussing in the office today. Does think it is worse when she is being active.   Has  intermittent worsening of LE edema, though good today. Most swollen at night, better when she wakes up in the morning. Weights stable at home.   No clear patterns on what makes it more likely to be a good or bad day in terms of her breathing.   No fevers, chills. Largely intermittent nonproductive cough. No PND or orthopnea. No syncope.   Past Medical History:  Diagnosis Date  . Anxiety   . Aortic stenosis    status post aortic valve replacement with St. Jude mechanical prosthesis  . 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   70mm by echo 06/2016 and 57mm by CT angin 2016  . Chronic anticoagulation   . 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 Depression   . DJD (degenerative joint disease) of knee   . Dyslipidemia   . GERD (gastroesophageal reflux disease)    "several years ago; none since" (11/12/2012)  . Hyperlipidemia   . Hyperlipidemia LDL goal <70 01/19/2017  . Hypertension   . Left atrial enlargement   . Mitral regurgitation 06/25/2016   Mild moderate MR by echo 06/2016  . Obesity   . Persistent atrial fibrillation (HCC)    s/p afib ablation x 2 at [facility]  . Sleep apnea    On CPAP at 12cm H2O  . 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 - now cleared by neurosurgery to maintain INR 2-2.5    Past  Surgical History:  Procedure Laterality Date  . BURR HOLE FOR SUBDURAL HEMATOMA  2006  . CARDIAC VALVE REPLACEMENT  2006   St. Jude AVR  . CARDIOVERSION N/A 07/22/2012   Procedure: CARDIOVERSION;  Surgeon: [name], MD;  Location: [facility] ENDOSCOPY;  Service: Cardiovascular;  Laterality: N/A;  . CARDIOVERSION N/A 11/25/2012   Procedure: CARDIOVERSION;  Surgeon: [name], MD;  Location: [facility] ENDOSCOPY;  Service: Cardiovascular;  Laterality: N/A;  . CARDIOVERSION N/A 12/30/2012   Procedure: CARDIOVERSION;  Surgeon: [name], MD;  Location: [facility] ENDOSCOPY;  Service: Cardiovascular;  Laterality: N/A;  h&p in  file-HW   . CARDIOVERSION N/A 03/30/2013   Procedure: CARDIOVERSION;  Surgeon: [name], MD;  Location: [facility] ENDOSCOPY;  Service: Cardiovascular;  Laterality: N/A;  . ELECTROPHYSIOLOGIC STUDY  11/2013   Afib ablation x 2 (11/2013 and 10/2015) at [facility] by Dr Clyda Hurdle with recurrence post ablation  . KNEE ARTHROSCOPY Left 1980's   "2" (11/12/2012)  . KNEE SURGERY Left 1980's   "after 2 scopes they went in and did some kind of OR" (11/12/2012)  . TEE WITHOUT CARDIOVERSION N/A 07/22/2012   Procedure: TRANSESOPHAGEAL ECHOCARDIOGRAM (TEE);  Surgeon: [name], MD;  Location: [facility] ENDOSCOPY;  Service: Cardiovascular;  Laterality: N/A;  Rm 2034  . TEE WITHOUT CARDIOVERSION N/A 10/07/2013   Procedure: TRANSESOPHAGEAL ECHOCARDIOGRAM (TEE);  Surgeon: [name], MD;  Location: [facility] ENDOSCOPY;  Service: Cardiovascular;  Laterality: N/A;  . TOTAL KNEE ARTHROPLASTY Left 11/05/2014   Procedure: TOTAL KNEE ARTHROPLASTY;  Surgeon: Dorna Leitz, MD;  Location: [place];  Service: Orthopedics;  Laterality: Left;  . TUBAL LIGATION  1980's    Current Medications: Current Outpatient Medications on File Prior to Visit  Medication Sig  . ALPRAZolam (XANAX) 0.5 MG tablet Take 0.5 mg by mouth 2 (two) times daily as needed. AS NEEDED FOR ANXIETY  . budesonide-formoterol (SYMBICORT) 80-4.5 MCG/ACT inhaler Inhale 2 puffs into the lungs 2 (two) times daily.  . calcium-vitamin D (OSCAL WITH D) 500-200 MG-UNIT per tablet Take 2 tablets by mouth daily.   . Cholecalciferol (VITAMIN D) 2000 UNITS tablet Take 4,000 Units by mouth daily.  Marland Kitchen DILT-XR 240 MG 24 hr capsule TAKE 1 CAPSULE BY MOUTH EVERY MORNING ON AN EMPTY STOMACH  . ferrous sulfate 325 (65 FE) MG tablet Take 325 mg by mouth daily with breakfast.  . furosemide (LASIX) 80 MG tablet Take 1 tablet (80 mg total) by mouth daily. May take an additional tablet as needed for swelling  . levothyroxine (SYNTHROID, LEVOTHROID) 100 MCG tablet Take 100 mcg daily before breakfast by mouth.    . losartan (COZAAR) 100 MG tablet Take 1 tablet (100 mg total) by mouth daily.  . Multiple Vitamin (MULTIVITAMIN WITH MINERALS) TABS Take 1 tablet by mouth daily.  . potassium chloride SA (KLOR-CON) 20 MEQ tablet TAKE 2 TABLETS BY MOUTH EVERY MORNING AND 1 TABLET EVERY EVENING  . pravastatin (PRAVACHOL) 20 MG tablet TAKE 1 TABLET BY MOUTH EVERY DAY  . predniSONE (DELTASONE) 10 MG tablet Take  4 each am x 2 days,   2 each am x 2 days,  1 each am x 2 days and stop  . sertraline (ZOLOFT) 50 MG tablet Take 100 mg by mouth daily. Pt takes 75 mg daily   . tretinoin (RETIN-A) 0.05 % cream Apply 1 application topically at bedtime.   Marland Kitchen warfarin (COUMADIN) 4 MG tablet Take 1/2 to 1 tablet daily as directed by Coumadin clinic  . zolpidem (AMBIEN) 10 MG  tablet Take 5 mg by mouth at bedtime as needed for sleep.   No current facility-administered medications on file prior to visit.     Allergies:   Codeine, Beta adrenergic blockers, Metoprolol, Propoxyphene, Toprol xl [metoprolol tartrate], and Dilantin [phenytoin sodium extended]   Social History   Tobacco Use  . Smoking status: Never Smoker  . Smokeless tobacco: Never Used  Substance Use Topics  . Alcohol use: No    Comment: 11/12/2012 "no alcohol in the last 9 years"  . Drug use: No    Family History: The patient's family history includes Heart disease in her brother; Hyperlipidemia in her father; Hypertension in her father and mother; Lung cancer in her mother.  ROS:   Please see the history of present illness.  Additional pertinent ROS otherwise unremarkable except as noted in HPI.    EKGs/Labs/Other Studies Reviewed:    The following studies were reviewed today: echo 11/12/18:  1. The left ventricle has low normal systolic function, with an ejection fraction of 50-55%. The cavity size was normal. Left ventricular diastolic function could not be evaluated  2. The right ventricle has moderately reduced systolic function. The cavity was  mildly enlarged. There is no increase in right ventricular wall thickness.  3. Left atrial size was severely dilated.  4. Right atrial size was severely dilated.  5. The mitral valve is grossly normal. There is moderate mitral annular calcification present.  6. The tricuspid valve is grossly normal. Tricuspid valve regurgitation is severe.  7. The observed AV gradient is likely normal for this mechanical valve.  8. The aorta is abnormal in size and structure.  9. There is moderate dilatation of the ascending aorta. 10. The inferior vena cava was dilated in size with <50% respiratory variability. 11. The interatrial septum was not assessed.  Monitor results, prior echoes also reviewed today  EKG:  EKG is personally reviewed.  The ekg ordered today demonstrates atrial fibrillation at 88 bpm.  Recent Labs: 04/27/2019: Pro B Natriuretic peptide (BNP) 264.0 06/08/2019: BUN 23; Creatinine, Ser 0.94; Hemoglobin 12.4; Platelets 226.0; Potassium 3.9; Sodium 140; TSH 3.25  Recent Lipid Panel    Component Value Date/Time   CHOL 175 05/27/2018 1125   TRIG 100 05/27/2018 1125   HDL 71 05/27/2018 1125   CHOLHDL 2.5 05/27/2018 1125   LDLCALC 84 05/27/2018 1125    Physical Exam:    VS:  BP 130/80   Pulse 88   Temp (!) 96.3 F (35.7 C)   Ht 5\' 7"  (1.702 m)   Wt 231 lb 9.6 oz (105.1 kg)   SpO2 96%   BMI 36.27 kg/m     Wt Readings from Last 3 Encounters:  08/03/19 231 lb 9.6 oz (105.1 kg)  06/23/19 228 lb (103.4 kg)  06/18/19 227 lb 9.6 oz (103.2 kg)    GEN: Well nourished, well developed in no acute distress HEENT: Normal, moist mucous membranes NECK: No sustained JVD, +visible TR wave at clavicle CARDIAC: irregularly irregular rhythm, normal S1, crisp metallic S2, no rubs or gallops. 2/6 systolic murmur. VASCULAR: Radial and DP pulses 2+ bilaterally. No carotid bruits RESPIRATORY:  Clear to auscultation without rales, wheezing or rhonchi  ABDOMEN: Soft, non-tender,  non-distended MUSCULOSKELETAL:  Ambulates independently SKIN: Warm and dry, trace bilateral LE edema NEUROLOGIC:  Alert and oriented x 3. No focal neuro deficits noted. PSYCHIATRIC:  Normal affect, intermittently tearful  ASSESSMENT:    1. Chest pain, unspecified type   2. Permanent atrial fibrillation ([place])  3. Chronic diastolic CHF (congestive heart failure) ([place])   4. Essential hypertension   5. SOB (shortness of breath)   6. H/O mechanical aortic valve replacement   7. Nonrheumatic mitral valve regurgitation   8. Severe tricuspid regurgitation    PLAN:    Mild chest tightness, shortness of breath: difficult to assess etiology. No clear triggers, both at rest and when being active. Nothing seems to improve it.  -will perform lexiscan nuclear stress test to evaluate for ischemia (prior normal) -if stress unremarkable, would consider repeating echo, though most recent one done less than a year ago -counseled on red flag warning signs that need immediate medical attention  Chronic diastolic heart failure, mild mitral regurgitation, severe tricuspid regurgitation: stable volume status -no change in physical exam today  -continue furosemide 80 mg daily plus PRN  Permanent atrial fibrillation: on coumadin for mechanical valve. Chadsvasc=4 -has extensive treatment history, see prior notes.  -see prior notes, we discussed treatment options extensively -we reviewed again today that given her previous attempts at management, her biatrial enlargement, and the duration of her afib, that it is unlikely we will be able to get her in sustained sinus rhythm again -she is currently rate controlled on diltiazem  Mechanical aortic valve -follows with coumadin clinic -stable gradient on prior echo, no change to murmur, but may need to recheck gradient on echo if no other clear etiology for her shortness of breath is found.  Hypertension: at goal today -continue diltiazem 240 mg daily,  furosemide 80 mg daily, losartan 100 mg daily  Stable ascending aortic aneurysm: 4.4 2018-2019.  OSA: continue CPAP, recently replaced, though reports still having intermittent PM desaturations.  Cardiac risk counseling and prevention recommendations: -recommend heart healthy/Mediterranean diet, with whole grains, fruits, vegetable, fish, lean meats, nuts, and olive oil. Limit salt. -recommend moderate walking, 3-5 times/week for 30-50 minutes each session. Aim for at least 150 minutes.week. Goal should be pace of 3 miles/hours, or walking 1.5 miles in 30 minutes -recommend avoidance of tobacco products. Avoid excess alcohol. -continue pravastatin for elevated ASCVD risk -ASCVD risk score: The 10-year ASCVD risk score Mikey Bussing DC Brooke Bonito., et al., 2013) is: 19.1%   Values used to calculate the score:     Age: 38 years     Sex: Female     Is Non-Hispanic African American: No     Diabetic: No     Tobacco smoker: No     Systolic Blood Pressure: [id number] mmHg     Is BP treated: Yes     HDL Cholesterol: 71 mg/dL     Total Cholesterol: 175 mg/dL    Plan for follow up: 6 weeks to monitor symptoms  Buford Dresser, MD, PhD [place]  [facility]   Medication Adjustments/Labs and Tests Ordered: Current medicines are reviewed at length with the patient today.  Concerns regarding medicines are outlined above.  Orders Placed This Encounter  Procedures  . MYOCARDIAL PERFUSION IMAGING  . EKG 12-Lead   No orders of the defined types were placed in this encounter.   Patient Instructions  Medication Instructions:  Your Physician recommend you continue on your current medication as directed.    *If you need a refill on your cardiac medications before your next appointment, please call your pharmacy*   Lab Work: None   Testing/Procedures: Your physician has requested that you have a lexiscan myoview. For further information please visit [email or web address]. Please follow instruction  sheet, as given. [address]  Follow-Up: At [facility], you and your health needs are our priority.  As part of our continuing mission to provide you with exceptional heart care, we have created designated Provider Care Teams.  These Care Teams include your primary Cardiologist (physician) and Advanced Practice Providers (APPs -  Physician Assistants and Nurse Practitioners) who all work together to provide you with the care you need, when you need it.  We recommend signing up for the patient portal called "MyChart".  Sign up information is provided on this After Visit Summary.  MyChart is used to connect with patients for Virtual Visits (Telemedicine).  Patients are able to view lab/test results, encounter notes, upcoming appointments, etc.  Non-urgent messages can be sent to your provider as well.   To learn more about what you can do with MyChart, go to [email or web address].    Your next appointment:   6 week(s)  The format for your next appointment:   In Person  Provider:   Buford Dresser, MD  You are scheduled for a Myocardial Perfusion Imaging Study on.  Please arrive 15 minutes prior to your appointment time for registration and insurance purposes.  The test will take approximately 3 to 4 hours to complete; you may bring reading material.  If someone comes with you to your appointment, they will need to remain in the main lobby due to limited space in the testing area. **If you are pregnant or breastfeeding, please notify the nuclear lab prior to your appointment**  How to prepare for your Myocardial Perfusion Test: . Do not eat or drink 3 hours prior to your test, except you may have water. . Do not consume products containing caffeine (regular or decaffeinated) 12 hours prior to your test. (ex: coffee, chocolate, sodas, tea). . Do bring a list of your current medications with you.  If not listed below, you may take your medications as  normal. . Do wear comfortable clothes (no dresses or overalls) and walking shoes, tennis shoes preferred (No heels or open toe shoes are allowed). . Do NOT wear cologne, perfume, aftershave, or lotions (deodorant is allowed). . If these instructions are not followed, your test will have to be rescheduled.  Please report to [address], [address] for your test.  If you have questions or concerns about your appointment, you can call the Nuclear Lab at [phone].  If you cannot keep your appointment, please provide 24 hours notification to the Nuclear Lab, to avoid a possible $50 charge to your account.  Signed, Buford Dresser, MD PhD 08/03/19 [place]

## 2019-08-04 ENCOUNTER — Ambulatory Visit (INDEPENDENT_AMBULATORY_CARE_PROVIDER_SITE_OTHER): Payer: Medicare Other | Admitting: Internal Medicine

## 2019-08-04 ENCOUNTER — Ambulatory Visit (INDEPENDENT_AMBULATORY_CARE_PROVIDER_SITE_OTHER): Payer: Medicare Other

## 2019-08-04 ENCOUNTER — Encounter: Payer: Self-pay | Admitting: Internal Medicine

## 2019-08-04 DIAGNOSIS — R0600 Dyspnea, unspecified: Secondary | ICD-10-CM | POA: Diagnosis not present

## 2019-08-04 DIAGNOSIS — J45991 Cough variant asthma: Secondary | ICD-10-CM

## 2019-08-04 DIAGNOSIS — R0602 Shortness of breath: Secondary | ICD-10-CM | POA: Diagnosis not present

## 2019-08-04 DIAGNOSIS — R0609 Other forms of dyspnea: Secondary | ICD-10-CM

## 2019-08-04 DIAGNOSIS — G4733 Obstructive sleep apnea (adult) (pediatric): Secondary | ICD-10-CM

## 2019-08-04 NOTE — Progress Notes (Signed)
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 Pulmonary Consult    Dr. Rayann Heman sent pt here, PFT showed advanced lung disease, pt states is fatigued and SOB  on best best days can walk the neighborhood x 7-8 min includes some hills for the first time as previously lived [place]atter neighborhood and ever since new walk has noted more sob, but only on the hills Sleeps with cpap feeling fine/ [name] Each am wakes up with cough productive mucus is yellowy green x "a while" =  months / Not years assoc with nasal congestion but no wheezing  rec Augmentin 875 mg take one pill twice daily  X 10 days  Need to let the coumadin clinic know you are on this medication Ocean nasal spray as much as you want     06/01/2016  f/u ov/[name] re: still on amiodarone Chief Complaint  Patient presents with  . Follow-up    PFT's done today. Breathing is "fine" has good days and bad days. She c/o nasal congestion recently.   can walk 10 min around neighborhood x some steep hills slow her down but never has to stop  No more bad days since first of the year / feels like afib better and that's what is making the difference  Main concern is variable nasal congestion and sense of pnds x months, worse at hs and in am assoc with cough Has tried otcs no better rec CT sinus 06/08/16 > neg / allergy profile neg 04/03/16     08/30/2016  f/u ov/[name] re: amiodarone  surveillance/ rhinitis with tendency to uacs  Chief Complaint  Patient presents with  . Follow-up    Pt c/o constant nasal congestion. She also c/o non prod cough for the past several wks.    ex tol varies with no pattern and on best days can still do ex fine  Nasal congestion started end of  2017 / better with afrin worse at hs but thinks becoming addicted to it  rec Dymista one twice daily until use up sample then if better       12/11/2016  f/u ov/[name] re:   Asthma/ rhinitis neg allergy eval  Chief Complaint  Patient presents with  . Follow-up    PFT done today, still SOB with exertion  continues with variable ex tol s specific pattern she is aware of  Nasal symptoms resolved p took dymista and did not refill and they have not recurred s need for otcs  Has never used saba before   rec Try symicort 160 Take 2 puffs first thing in am and then another 2 puffs about 12 hours later.  Work on inhaler technique:  relax and gently blow all the way out then take a nice smooth deep breath back in, triggering the inhaler at same time you  start breathing in.  Hold for up to 5 seconds if you can. Blow out thru nose. Rinse and gargle with water when done   01/22/2017  f/u ov/[name] re:  Doe on amio/ no better on symb 160 and now coughing  Chief Complaint  Patient presents with  . Follow-up    She states Symbicort made her cough more and so she has stopped taking med every day. She was given an albuterol inhaler by her PCP and uses this maybe once per wk on average.   walks dogs x 20 min with fine flat surface but some sob on hills for about about a min and no better on symb but hfa poor housework also makes her sob / saba no better  tsh high and thyroid being ajusted up by pcp  Coughs non productive only p symbicort 160 and did not cough like this prev/ no assoc pnds as before  rec Symbicort 80  Take 2 puffs first thing in am and then another 2 puffs about 12 hours later.  Work on inhaler  technique:      04/25/2017  f/u ov/[name] re:  Doe on amio / poor hfa / 02 and cpap per Dr Radford Pax Chief Complaint  Patient presents with  . Follow-up    6MW test done today. She is not coughing any less and states her breathing is unchanged. She states she does not have an albuterol inhaler.   still sob with vacuuming/ not sure syb 80 helping but hfa still poor  rec  Monitor your 02 saturation toward the end of your exertion Work on inhaler technique:  Take the dulera 100 = symb 80 Take 2 puffs first thing in am and then another 2 puffs about 12 hours later and they try off to see if any difference in either your cough or breathing and if either is worse start back on it    04/24/19 NP eval  Recommendations: Take Symbicort 2 puffs twice daily for the next 1 to 2 weeks Use Ocean nasal spray daily at bedtime If notice increase in weight or leg swelling please take additional Lasix     06/08/2019  f/u ov/[name] re: worse breathing/ coughing x 12/2018 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 Follow-up    Pt c/o cough with yellow, occ bloody sputum in the mornings. She also c/o SOB and wheezing.   Dyspnea:  Very sedentary  Cough: x 2 -3 m esp first thing in am described above / min vol/ min heme on coumadin s epistaxis  Sleeping: on cpap/ 3.5 lpm and desats on this SABA use: none  02: only using at hs x 2lpm / not monitoring sats with activity as rec  rec Symbicort 80 Take 2 puffs first thing in am and then another 2 puffs about 12 hours later.     Stopped amiodarone on 06/16/19 and could not tell any difference with symbicort    06/23/2019  f/u ov/[name] re:  Cough variant asthma vs uacs Chief Complaint  Patient presents with  . Follow-up    Breathing is worse since the last visit "every time I walk across the room". She also c/o cough with yellow to green sputum every am.   Dyspnea: gradually worse since last ov assoc  with nasal congestion  Cough: worse in am's / no more nose bleeds and no purulent secretions  Sleeping: on cpap / 3.5  lpm but low sats on 2 pillows, [place] eval pending  SABA use: none 02: not using x at hs  rec Stay on symbicort 80 Take 2 puffs first thing in am and then another 2 puffs about 12 hours later.  Work on inhaler technique:    Prednisone 10 mg take  4 each am x 2 days,   2 each am x 2 days,  1 each am x 2 days and stop  Augmentin 875 mg take one pill twice daily  X 10 days    08/04/2019  f/u ov/[name] re: "I'm no better, if anything worse"  No better p rx for sinus dz  Chief Complaint  Patient presents with  . Follow-up    Breathing "might be a little worse". She is still coughing, mainly in the am with yellow to green sputum.   Dyspnea:  Not much better even with pred  Cough: same p augmentin = yellow, worse in am  Sleeping: feels cpap working but sats are very low in am when checks them and wakes up feeling lousy SABA use: none  02: only hs with cpap   No obvious patterns in  day to day or daytime variability or assoc mucus plugs or hemoptysis or cp or chest tightness, subjective wheeze or overt sinus or hb symptoms.   Sleeping as above without nocturnal  or early am exacerbation  of respiratory  c/o's or need for noct saba. Also denies any obvious fluctuation of symptoms with weather or environmental changes or other aggravating or alleviating factors except as outlined above   No unusual exposure hx or h/o childhood pna/ asthma or knowledge of premature birth.  Current Allergies, Complete Past Medical History, Past Surgical History, Family History, and Social History were reviewed in [employer] record.  ROS  The following are not active complaints unless bolded Hoarseness, sore throat, dysphagia, dental problems, itching, sneezing,  nasal congestion or discharge of excess mucus or purulent secretions, ear ache,   fever, chills, sweats,  unintended wt loss or wt gain, classically pleuritic or exertional cp,  orthopnea pnd or arm/hand swelling  or leg swelling, presyncope, palpitations, abdominal pain, anorexia, nausea, vomiting, diarrhea  or change in bowel habits or change in bladder habits, change in stools or change in urine, dysuria, hematuria,  rash, arthralgias, visual complaints, headache, numbness, weakness or ataxia or problems with walking or coordination,  change in mood or  memory.        Current Meds  Medication Sig  . ALPRAZolam (XANAX) 0.5 MG tablet Take 0.5 mg by mouth 2 (two) times daily as needed. AS NEEDED FOR ANXIETY  . budesonide-formoterol (SYMBICORT) 80-4.5 MCG/ACT inhaler Inhale 2 puffs into the lungs 2 (two) times daily.  . calcium-vitamin D (OSCAL WITH D) 500-200 MG-UNIT per tablet Take 2 tablets by mouth daily.   . Cholecalciferol (VITAMIN D) 2000 UNITS tablet Take 4,000 Units by mouth daily.  Marland Kitchen DILT-XR 240 MG 24 hr capsule TAKE 1 CAPSULE BY MOUTH EVERY MORNING ON AN EMPTY STOMACH  . ferrous sulfate 325 (65 FE) MG tablet Take 325 mg by mouth daily with breakfast.  . furosemide (LASIX) 80 MG tablet Take 1 tablet (80 mg total) by mouth daily. May take an additional tablet as needed for swelling  . levothyroxine (SYNTHROID, LEVOTHROID) 100 MCG tablet Take 100 mcg daily before breakfast by mouth.  . losartan (COZAAR) 100 MG tablet Take 1 tablet (100 mg total) by mouth daily.  . Multiple Vitamin (MULTIVITAMIN  WITH MINERALS) TABS Take 1 tablet by mouth daily.  . potassium chloride SA (KLOR-CON) 20 MEQ tablet TAKE 2 TABLETS BY MOUTH EVERY MORNING AND 1 TABLET EVERY EVENING  . pravastatin (PRAVACHOL) 20 MG tablet TAKE 1 TABLET BY MOUTH EVERY DAY  . sertraline (ZOLOFT) 50 MG tablet Take 100 mg by mouth daily. Pt takes 75 mg daily   . tretinoin (RETIN-A) 0.05 % cream Apply 1 application topically at bedtime.   Marland Kitchen warfarin (COUMADIN) 4 MG tablet Take 1/2 to 1 tablet daily as directed by Coumadin clinic  . zolpidem  (AMBIEN) 10 MG tablet Take 5 mg by mouth at bedtime as needed for sleep.                        Objective:   Physical Exam   amb obese wf nad hopeless affect    08/04/2019       233 06/23/2019       228 06/08/2019         232 04/25/2017       231  01/22/2017     237  12/11/2016         239  08/30/2016       235  06/01/2016       235   04/03/16 233 lb 6.4 oz (105.9 kg)  03/19/16 240 lb 12.8 oz (109.2 kg)  12/28/15 229 lb 6.4 oz (104.1 kg)      Vital signs reviewed  08/04/2019  - Note at rest 02 sats  92% on RA       HEENT : pt wearing mask not removed for exam due to covid -19 concerns.    NECK :  without JVD/Nodes/TM/ nl carotid upstrokes bilaterally   LUNGS: no acc muscle use,  Nl contour chest which is clear to A and P bilaterally without cough on insp or exp maneuvers   CV:  IRIR   no s3 or murmur or increase in P2, and 1+ pitting edema both LE    ABD:  Obese soft and nontender with nl inspiratory excursion in the supine position. No bruits or organomegaly appreciated, bowel sounds nl  MS:  Nl gait/ ext warm without deformities, calf tenderness, cyanosis or clubbing No obvious joint restrictions   SKIN: warm and dry without lesions    NEURO:  alert, approp, nl sensorium with  no motor or cerebellar deficits apparent.          CXR PA and Lateral:   08/04/2019 :    I personally reviewed images and agree with radiology impression as follows:    Cardiac enlargement without acute abnormality           Assessment:

## 2019-08-04 NOTE — Patient Instructions (Addendum)
Work on inhaler technique:  relax and gently blow all the way out then take a nice smooth deep breath back in, triggering the inhaler at same time you start breathing in.  Hold for up to 5 seconds if you can. Blow out thru nose. Rinse and gargle with water when done  We will call to schedule your Sinus CT   Return next available for PFTs to be done after your am symbicort 80 Take 2 puffs first thing in am

## 2019-08-05 ENCOUNTER — Encounter: Payer: Self-pay | Admitting: Internal Medicine

## 2019-08-05 NOTE — Assessment & Plan Note (Signed)
Onset 2017 PFT's  03/21/2016  FEV1 1.50 (58 % ) ratio 80  p 14 % improvement from saba p nothing prior to study with DLCO  65/66c % corrects to 94  % for alv volume   - FENO 04/03/2016  =   7  Allergy profile 04/03/2016 >  Eos 0.1 /  IgE  17 neg RAST - PFT's  06/01/2016  FEV1 1.57 (61 % ) ratio 84  p 22 % improvement from saba p nothing prior to study with DLCO  57/56 % corrects to 87  % for alv volume  - Sinus CT 06/08/2016 > No evidence of sinusitis - trial of dysmista 08/30/2016 > resolved and did not recur off rx as of 12/11/2016  - PFT's  12/11/2016  FEV1 1.59 (63 % ) ratio 80  p 30 % improvement from saba p nothing prior to study with DLCO  61/60 % corrects to 103  % for alv volume  With erv 9% and classic obst curvature  - 12/11/2016    Try symb 160 2bid > worse cough  - 01/22/2017   try symb 80 2bid x 2 weeks> no change - 08/04/2019  After extensive coaching inhaler device,  effectiveness =    0 > 50 % with coaching      She does have an asthmatic component and may benefit form more consistent rx with symb 80 2bid if learns to inhale it correctly   >>> return for full pfts p am spirometry

## 2019-08-05 NOTE — Progress Notes (Signed)
Spoke with pt and notified of results per Dr. Wert. Pt verbalized understanding and denied any questions. 

## 2019-08-05 NOTE — Assessment & Plan Note (Signed)
Amiodarone restarted 03/19/16  PFT's  03/21/2016  FEV1 1.50 (58 % ) ratio 80  p 14 % improvement from saba p nothing prior to study with DLCO  65/66c % corrects to 94  % for alv volume   - PFT's  06/01/2016  FEV1 1.57 (61 % ) ratio 84  p 22 % improvement from saba p nothing prior to study with DLCO  57/56 % corrects to 87  % for alv volume  - 08/30/2016  Walked RA x 3 laps @ 185 ft each stopped due to  End of study, fast pace, no sob or desat   - PFT's 12/11/2016  No change in dlco   - 6 mw 04/25/2017  = 430 m / no desats -06/08/2019   Walked RA x one lap =  approx 250 ft -@ slow  pace stopped due to She became SOB half-way through walk but was able to complete lap. Unable to complete 2nd lap due to SOB despite sats 92% at end  Stopped amiodarone on 06/16/19   Cm on cxr with severe LAE / RAE by last echo and now in CAF so feel most of her doe is likely cardiac at this point and > f/u cards planned

## 2019-08-05 NOTE — Assessment & Plan Note (Signed)
06/08/2019  Reports desats on cpap/02  - eval 07/18/19 Dr Kathie Dike dx very severe OSA with desats >>>   neeeds cpap 12 cm with no 02   Advised pt to discuss 02 needs at hs but based on this study apparently does not need noct 02 so suggest ono on CPAP to see if desats really have been corrected on present rx         Each maintenance medication was reviewed in detail including emphasizing most importantly the difference between maintenance and prns and under what circumstances the prns are to be triggered using an action plan format where appropriate.  Total time for H and P, chart review, counseling, teaching device and generating customized AVS unique to this office visit / charting = 20 min

## 2019-08-15 ENCOUNTER — Other Ambulatory Visit: Payer: Self-pay | Admitting: Cardiology

## 2019-08-17 DIAGNOSIS — R0609 Other forms of dyspnea: Secondary | ICD-10-CM

## 2019-08-17 DIAGNOSIS — G4733 Obstructive sleep apnea (adult) (pediatric): Secondary | ICD-10-CM

## 2019-08-17 NOTE — Telephone Encounter (Signed)
Called and spoke with patient. There was order put in for her Sinus CT so I have placed order. She is also now set up with an sleep consult with Dr. Ander Slade on 6/14. Nothing further needed at this time.

## 2019-08-20 ENCOUNTER — Telehealth (HOSPITAL_COMMUNITY): Payer: Self-pay | Admitting: *Deleted

## 2019-08-20 NOTE — Telephone Encounter (Signed)
Left message on voicemail per DPR in reference to upcoming appointment scheduled on 08/24/19 with detailed instructions given per Myocardial Perfusion Study Information Sheet for the test. LM to arrive 15 minutes early, and that it is imperative to arrive on time for appointment to keep from having the test rescheduled. If you need to cancel or reschedule your appointment, please call the office within 24 hours of your appointment. Failure to do so may result in a cancellation of your appointment, and a $50 no show fee. Phone number given for call back for any questions. Kirstie Peri

## 2019-08-22 ENCOUNTER — Other Ambulatory Visit (HOSPITAL_COMMUNITY)
Admission: RE | Admit: 2019-08-22 | Discharge: 2019-08-22 | Disposition: A | Discharge status: Home / Self Care | Payer: Medicare Other | Source: Ambulatory Visit | Attending: Cardiology | Admitting: Cardiology

## 2019-08-22 DIAGNOSIS — Z01812 Encounter for preprocedural laboratory examination: Secondary | ICD-10-CM | POA: Diagnosis not present

## 2019-08-22 DIAGNOSIS — Z20822 Contact with and (suspected) exposure to covid-19: Secondary | ICD-10-CM | POA: Insufficient documentation

## 2019-08-22 LAB — SARS CORONAVIRUS 2 (TAT 6-24 HRS): SARS Coronavirus 2: NEGATIVE

## 2019-08-24 ENCOUNTER — Other Ambulatory Visit: Payer: Self-pay

## 2019-08-24 ENCOUNTER — Ambulatory Visit (HOSPITAL_COMMUNITY): Payer: Medicare Other | Attending: Cardiovascular Disease

## 2019-08-24 VITALS — Ht 67.0 in | Wt 233.0 lb

## 2019-08-24 DIAGNOSIS — I480 Paroxysmal atrial fibrillation: Secondary | ICD-10-CM

## 2019-08-24 DIAGNOSIS — R079 Chest pain, unspecified: Secondary | ICD-10-CM | POA: Diagnosis not present

## 2019-08-24 DIAGNOSIS — R0600 Dyspnea, unspecified: Secondary | ICD-10-CM | POA: Insufficient documentation

## 2019-08-24 DIAGNOSIS — R0609 Other forms of dyspnea: Secondary | ICD-10-CM

## 2019-08-24 MED ORDER — REGADENOSON 0.4 MG/5ML IV SOLN
0.4000 mg | Freq: Once | INTRAVENOUS | Status: AC
  Administered 2019-08-24: 0.4 mg via INTRAVENOUS

## 2019-08-24 MED ORDER — TECHNETIUM TC 99M TETROFOSMIN IV KIT
32.5000 | PACK | Freq: Once | INTRAVENOUS | Status: AC | PRN
  Administered 2019-08-24: 32.5 via INTRAVENOUS
  Filled 2019-08-24: qty 33

## 2019-08-25 ENCOUNTER — Other Ambulatory Visit: Payer: Self-pay | Admitting: *Deleted

## 2019-08-25 ENCOUNTER — Ambulatory Visit (HOSPITAL_COMMUNITY): Payer: Medicare Other | Attending: Internal Medicine

## 2019-08-25 DIAGNOSIS — R0609 Other forms of dyspnea: Secondary | ICD-10-CM

## 2019-08-25 LAB — MYOCARDIAL PERFUSION IMAGING
LV dias vol: 143 mL (ref 46–106)
LV sys vol: 78 mL
Peak HR: 97 {beats}/min
Rest HR: 85 {beats}/min
SDS: 4
SRS: 1
SSS: 5
TID: 1.06

## 2019-08-25 MED ORDER — TECHNETIUM TC 99M TETROFOSMIN IV KIT
31.5000 | PACK | Freq: Once | INTRAVENOUS | Status: AC | PRN
  Administered 2019-08-25: 31.5 via INTRAVENOUS
  Filled 2019-08-25: qty 32

## 2019-08-26 ENCOUNTER — Ambulatory Visit (INDEPENDENT_AMBULATORY_CARE_PROVIDER_SITE_OTHER): Payer: Medicare Other | Admitting: Internal Medicine

## 2019-08-26 ENCOUNTER — Ambulatory Visit
Admission: RE | Admit: 2019-08-26 | Discharge: 2019-08-26 | Disposition: A | Discharge status: Home / Self Care | Payer: Medicare Other | Source: Ambulatory Visit | Attending: Internal Medicine | Admitting: Internal Medicine

## 2019-08-26 ENCOUNTER — Other Ambulatory Visit: Payer: Self-pay

## 2019-08-26 DIAGNOSIS — R0600 Dyspnea, unspecified: Secondary | ICD-10-CM

## 2019-08-26 DIAGNOSIS — R0609 Other forms of dyspnea: Secondary | ICD-10-CM

## 2019-08-26 DIAGNOSIS — G4733 Obstructive sleep apnea (adult) (pediatric): Secondary | ICD-10-CM | POA: Diagnosis not present

## 2019-08-26 DIAGNOSIS — R0602 Shortness of breath: Secondary | ICD-10-CM

## 2019-08-26 DIAGNOSIS — J342 Deviated nasal septum: Secondary | ICD-10-CM | POA: Diagnosis not present

## 2019-08-26 NOTE — Progress Notes (Signed)
Full PFT performed today. °

## 2019-08-27 ENCOUNTER — Telehealth: Payer: Self-pay | Admitting: Internal Medicine

## 2019-08-27 NOTE — Telephone Encounter (Signed)
Spoke with pt regarding Sinus CT results and Dr Gustavus Bryant recommendations. PT verbalized understanding and she already has an appt with Dr Gala Murdoch in June to discuss OSA so she will discuss this with him at that time. Pt also asking about results of PFT.   Dr Melvyn Novas, please advise on PFT results.

## 2019-08-27 NOTE — Telephone Encounter (Signed)
Pt returning call.  [phone]

## 2019-08-27 NOTE — Telephone Encounter (Signed)
Tanda Rockers, MD  08/27/2019 1:23 PM EDT    Call patient : Study shows swellling of her turbinates which might contribute to nasal congestion but not typically to coughing > refer to ent if she doesn't have one or talk to [facility] about it with f/u here.   Attempted to call pt but line went straight to VM. Left message for pt to return call.

## 2019-08-27 NOTE — Progress Notes (Signed)
Left a VM requesting a return call to obtain her CT results.

## 2019-08-28 NOTE — Telephone Encounter (Signed)
PFTs suggest Does appear to have asthma so continue symbicort consistenly  Take 2 puffs first thing in am and then another 2 puffs about 12 hours later and f/u with [place] as planned

## 2019-08-28 NOTE — Telephone Encounter (Signed)
Called pt and advised message from the provider. Pt understood and verbalized understanding. Nothing further is needed.   Pt requested appt with MW. Appt made for 08/31/2019 at 3:15pm.

## 2019-08-31 ENCOUNTER — Ambulatory Visit (INDEPENDENT_AMBULATORY_CARE_PROVIDER_SITE_OTHER): Payer: Medicare Other | Admitting: Internal Medicine

## 2019-08-31 ENCOUNTER — Other Ambulatory Visit: Payer: Self-pay

## 2019-08-31 DIAGNOSIS — R0600 Dyspnea, unspecified: Secondary | ICD-10-CM

## 2019-08-31 DIAGNOSIS — J45991 Cough variant asthma: Secondary | ICD-10-CM | POA: Diagnosis not present

## 2019-08-31 DIAGNOSIS — G4733 Obstructive sleep apnea (adult) (pediatric): Secondary | ICD-10-CM

## 2019-08-31 DIAGNOSIS — R0609 Other forms of dyspnea: Secondary | ICD-10-CM

## 2019-08-31 LAB — PULMONARY FUNCTION TEST
DL/VA % pred: 136 %
DL/VA: 5.57 ml/min/mmHg/L
DLCO cor % pred: 61 %
DLCO cor: 12.17 ml/min/mmHg
DLCO unc % pred: 61 %
DLCO unc: 12.17 ml/min/mmHg
FEF 25-75 Post: 0.78 L/sec
FEF 25-75 Pre: 0.59 L/sec
FEF2575-%Change-Post: 32 %
FEF2575-%Pred-Post: 44 %
FEF2575-%Pred-Pre: 33 %
FEV1-%Change-Post: 8 %
FEV1-%Pred-Post: 42 %
FEV1-%Pred-Pre: 39 %
FEV1-Post: 0.95 L
FEV1-Pre: 0.88 L
FEV1FVC-%Change-Post: 2 %
FEV1FVC-%Pred-Pre: 96 %
FEV6-%Change-Post: 5 %
FEV6-%Pred-Post: 45 %
FEV6-%Pred-Pre: 42 %
FEV6-Post: 1.29 L
FEV6-Pre: 1.21 L
FEV6FVC-%Pred-Post: 104 %
FEV6FVC-%Pred-Pre: 104 %
FVC-%Change-Post: 5 %
FVC-%Pred-Post: 43 %
FVC-%Pred-Pre: 40 %
FVC-Post: 1.29 L
FVC-Pre: 1.21 L
Post FEV1/FVC ratio: 74 %
Post FEV6/FVC ratio: 100 %
Pre FEV1/FVC ratio: 72 %
Pre FEV6/FVC Ratio: 100 %
RV % pred: 97 %
RV: 2.26 L
TLC % pred: 72 %
TLC: 3.76 L

## 2019-08-31 MED ORDER — BREZTRI AEROSPHERE 160-9-4.8 MCG/ACT IN AERO: 2.0000 | INHALATION_SPRAY | Freq: Two times a day (BID) | RESPIRATORY_TRACT | 0 refills | Status: DC

## 2019-08-31 NOTE — Progress Notes (Signed)
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 Pulmonary Consult    Dr. Rayann Heman sent pt here, PFT showed advanced lung disease, pt states is fatigued and SOB  on best best days can walk the neighborhood x 7-8 min includes some hills for the first time as previously lived [place]atter neighborhood and ever since new walk has noted more sob, but only on the hills Sleeps with cpap feeling fine/ [name] Each am wakes up with cough productive mucus is yellowy green x "a while" =  months / Not years assoc with nasal congestion but no wheezing  rec Augmentin 875 mg take one pill twice daily  X 10 days  Need to let the coumadin clinic know you are on this medication Ocean nasal spray as much as you want     06/01/2016  f/u ov/[name] re: still on amiodarone Chief Complaint  Patient presents with  . Follow-up    PFT's done today. Breathing is "fine" has good days and bad days. She c/o nasal congestion recently.   can walk 10 min around neighborhood x some steep hills slow her down but never has to stop  No more bad days since first of the year / feels like afib better and that's what is making the difference  Main concern is variable nasal congestion and sense of pnds x months, worse at hs and in am assoc with cough Has tried otcs no better rec CT sinus 06/08/16 > neg / allergy profile neg 04/03/16     08/30/2016  f/u ov/[name] re: amiodarone  surveillance/ rhinitis with tendency to uacs  Chief Complaint  Patient presents with  . Follow-up    Pt c/o constant nasal congestion. She also c/o non prod cough for the past several wks.    ex tol varies with no pattern and on best days can still do ex fine  Nasal congestion started end of  2017 / better with afrin worse at hs but thinks becoming addicted to it  rec Dymista one twice daily until use up sample then if better       12/11/2016  f/u ov/[name] re:   Asthma/ rhinitis neg allergy eval  Chief Complaint  Patient presents with  . Follow-up    PFT done today, still SOB with exertion  continues with variable ex tol s specific pattern she is aware of  Nasal symptoms resolved p took dymista and did not refill and they have not recurred s need for otcs  Has never used saba before   rec Try symicort 160 Take 2 puffs first thing in am and then another 2 puffs about 12 hours later.  Work on inhaler technique:  relax and gently blow all the way out then take a nice smooth deep breath back in, triggering the inhaler at same time you  start breathing in.  Hold for up to 5 seconds if you can. Blow out thru nose. Rinse and gargle with water when done   01/22/2017  f/u ov/[name] re:  Doe on amio/ no better on symb 160 and now coughing  Chief Complaint  Patient presents with  . Follow-up    She states Symbicort made her cough more and so she has stopped taking med every day. She was given an albuterol inhaler by her PCP and uses this maybe once per wk on average.   walks dogs x 20 min with fine flat surface but some sob on hills for about about a min and no better on symb but hfa poor housework also makes her sob / saba no better  tsh high and thyroid being ajusted up by pcp  Coughs non productive only p symbicort 160 and did not cough like this prev/ no assoc pnds as before  rec Symbicort 80  Take 2 puffs first thing in am and then another 2 puffs about 12 hours later.  Work on inhaler  technique:      04/25/2017  f/u ov/[name] re:  Doe on amio / poor hfa / 02 and cpap per Dr Radford Pax Chief Complaint  Patient presents with  . Follow-up    6MW test done today. She is not coughing any less and states her breathing is unchanged. She states she does not have an albuterol inhaler.   still sob with vacuuming/ not sure syb 80 helping but hfa still poor  rec  Monitor your 02 saturation toward the end of your exertion Work on inhaler technique:  Take the dulera 100 = symb 80 Take 2 puffs first thing in am and then another 2 puffs about 12 hours later and they try off to see if any difference in either your cough or breathing and if either is worse start back on it    04/24/19 NP eval  Recommendations: Take Symbicort 2 puffs twice daily for the next 1 to 2 weeks Use Ocean nasal spray daily at bedtime If notice increase in weight or leg swelling please take additional Lasix     06/08/2019  f/u ov/[name] re: worse breathing/ coughing x 12/2018 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 Follow-up    Pt c/o cough with yellow, occ bloody sputum in the mornings. She also c/o SOB and wheezing.   Dyspnea:  Very sedentary  Cough: x 2 -3 m esp first thing in am described above / min vol/ min heme on coumadin s epistaxis  Sleeping: on cpap/ 3.5 lpm and desats on this SABA use: none  02: only using at hs x 2lpm / not monitoring sats with activity as rec  rec Symbicort 80 Take 2 puffs first thing in am and then another 2 puffs about 12 hours later.     Stopped amiodarone on 06/16/19 and could not tell any difference with symbicort    06/23/2019  f/u ov/[name] re:  Cough variant asthma vs uacs Chief Complaint  Patient presents with  . Follow-up    Breathing is worse since the last visit "every time I walk across the room". She also c/o cough with yellow to green sputum every am.   Dyspnea: gradually worse since last ov assoc  with nasal congestion  Cough: worse in am's / no more nose bleeds and no purulent secretions  Sleeping: on cpap / 3.5  lpm but low sats on 2 pillows, Braxton eval pending  SABA use: none 02: not using x at hs  rec Stay on symbicort 80 Take 2 puffs first thing in am and then another 2 puffs about 12 hours later.  Work on inhaler technique:    Prednisone 10 mg take  4 each am x 2 days,   2 each am x 2 days,  1 each am x 2 days and stop  Augmentin 875 mg take one pill twice daily  X 10 days    08/04/2019  f/u ov/[name] re: "I'm no better, if anything worse"  No better p rx for sinus dz  Chief Complaint  Patient presents with  . Follow-up    Breathing "might be a little worse". She is still coughing, mainly in the am with yellow to green sputum.   Dyspnea:  Not much better even with pred  Cough: same p augmentin = yellow, worse in am  Sleeping: feels cpap working but sats are very low in am when checks them and wakes up feeling lousy SABA use: none  02: only hs with cpap rec Work on inhaler technique:   Return next available for PFTs to be done after your am symbicort 80 Take 2 puffs first thing in am    08/31/2019  f/u ov/[name] re: chronic asthma  Chief Complaint  Patient presents with  . Follow-up    had pft done and still having sob currently using symbicort  Dyspnea:  Mailbox and back flat x 50 ft gives out  Cough: dry , flares esp in am p takes cpap off  Sleeping: flat on back on 2 pillows SABA use: none 02: 2lpm hs x 6-7 years    No obvious day to day or daytime variability or assoc excess/ purulent sputum or mucus plugs or hemoptysis or cp or chest tightness, subjective wheeze or overt sinus or hb symptoms.   Sleeping as above without nocturnal   exacerbation  of respiratory  c/o's or need for noct saba. Also denies any obvious fluctuation of symptoms with weather or environmental changes or other aggravating or alleviating factors except as outlined above   No unusual  exposure hx or h/o childhood pna/ asthma or knowledge of premature birth.  Current Allergies, Complete Past Medical History, Past Surgical History, Family History, and Social History were reviewed in [employer] record.  ROS  The following are not active complaints unless bolded Hoarseness, sore throat, dysphagia, dental problems, itching, sneezing,  nasal congestion or discharge of excess mucus or purulent secretions, ear ache,   fever, chills, sweats, unintended wt loss or wt gain, classically pleuritic or exertional cp,  orthopnea pnd or arm/hand swelling  or leg swelling, presyncope, palpitations, abdominal pain, anorexia, nausea, vomiting, diarrhea  or change in bowel habits or change in bladder habits, change in stools or change in urine, dysuria, hematuria,  rash, arthralgias, visual complaints, headache, numbness, weakness or ataxia or problems with walking or coordination,  change in mood or  memory.        Current Meds  Medication Sig  . ALPRAZolam (XANAX) 0.5 MG tablet Take 0.5 mg by mouth 2 (two) times daily as needed. AS NEEDED FOR ANXIETY  . budesonide-formoterol (SYMBICORT) 80-4.5 MCG/ACT inhaler Inhale 2 puffs into the lungs 2 (two) times daily.  . calcium-vitamin D (OSCAL WITH D) 500-200 MG-UNIT per tablet Take 2 tablets by mouth daily.   . Cholecalciferol (VITAMIN D) 2000 UNITS tablet Take 4,000 Units  by mouth daily.  Marland Kitchen DILT-XR 240 MG 24 hr capsule TAKE 1 CAPSULE BY MOUTH EVERY MORNING ON AN EMPTY STOMACH  . ferrous sulfate 325 (65 FE) MG tablet Take 325 mg by mouth daily with breakfast.  . furosemide (LASIX) 80 MG tablet Take 1 tablet (80 mg total) by mouth daily. May take an additional tablet as needed for swelling  . levothyroxine (SYNTHROID, LEVOTHROID) 100 MCG tablet Take 100 mcg daily before breakfast by mouth.  . losartan (COZAAR) 100 MG tablet Take 1 tablet (100 mg total) by mouth daily.  . Multiple Vitamin (MULTIVITAMIN WITH MINERALS) TABS Take 1  tablet by mouth daily.  . potassium chloride SA (KLOR-CON) 20 MEQ tablet TAKE 2 TABLETS BY MOUTH EVERY MORNING AND 1 TABLET EVERY EVENING  . pravastatin (PRAVACHOL) 20 MG tablet TAKE 1 TABLET BY MOUTH EVERY DAY  . sertraline (ZOLOFT) 50 MG tablet Take 100 mg by mouth daily. Pt takes 75 mg daily   . tretinoin (RETIN-A) 0.05 % cream Apply 1 application topically at bedtime.   Marland Kitchen warfarin (COUMADIN) 4 MG tablet TAKE 1/2 TO 1 TABLET BY MOUTH DAILY AS DIRECTED BY COUMADIN CLINIC  . zolpidem (AMBIEN) 10 MG tablet Take 5 mg by mouth at bedtime as needed for sleep.                    Objective:   Physical Exam  amb obese wf nad   08/31/2019       235 08/04/2019       233 06/23/2019       228 06/08/2019         232 04/25/2017       231  01/22/2017     237  12/11/2016         239  08/30/2016       235  06/01/2016       235   04/03/16 233 lb 6.4 oz (105.9 kg)  03/19/16 240 lb 12.8 oz (109.2 kg)  12/28/15 229 lb 6.4 oz (104.1 kg)     Vital signs reviewed  08/31/2019  - Note at rest 02 sats  96% on RA      HEENT : pt wearing mask not removed for exam due to covid -19 concerns.    NECK :  without JVD/Nodes/TM/ nl carotid upstrokes bilaterally   LUNGS: no acc muscle use,  Nl contour chest which is clear to A and P bilaterally without cough on insp or exp maneuvers   CV:  RRR  no s3 or murmur or increase in P2, and 1+ pitting both LE's   ABD:  soft and nontender with nl inspiratory excursion in the supine position. No bruits or organomegaly appreciated, bowel sounds nl  MS:  Nl gait/ ext warm without deformities, calf tenderness, cyanosis or clubbing No obvious joint restrictions   SKIN: warm and dry without lesions    NEURO:  alert, approp, nl sensorium with  no motor or cerebellar deficits apparent.              Assessment:

## 2019-08-31 NOTE — Patient Instructions (Addendum)
Stop symbicort and start breztri Take 2 puffs first thing in am and then another 2 puffs about 12 hours later.    Work on inhaler technique:  relax and gently blow all the way out then take a nice smooth deep breath back in, triggering the inhaler at same time you start breathing in.  Hold for up to 5 seconds if you can. Blow out thru nose. Rinse and gargle with water when done  Keep appt to see Dr Ander Slade re ability to walk mailbox and back and the cough that happens after you take the cpap off in the morning as well as your 02 levels being low while on present cpap settings

## 2019-08-31 NOTE — Progress Notes (Signed)
LMTCB

## 2019-09-01 ENCOUNTER — Encounter: Payer: Self-pay | Admitting: Internal Medicine

## 2019-09-01 NOTE — Assessment & Plan Note (Signed)
ERV 9% by pfts 12/11/2016  - ERV 6% by pfts 65/19/21   There is no height or weight on file to calculate BMI.  -  trending up  Lab Results  Component Value Date   TSH 3.25 06/08/2019     Contributing to gerd risk/ doe/reviewed the need and the process to achieve and maintain neg calorie balance > defer f/u primary care including intermittently monitoring thyroid status           Each maintenance medication was reviewed in detail including emphasizing most importantly the difference between maintenance and prns and under what circumstances the prns are to be triggered using an action plan format where appropriate.  Total time for H and P, chart review, counseling, teaching device and generating customized AVS unique to this office visit / charting = 30 min

## 2019-09-01 NOTE — Assessment & Plan Note (Addendum)
Amiodarone restarted 03/19/16  PFT's  03/21/2016  FEV1 1.50 (58 % ) ratio 80  p 14 % improvement from saba p nothing prior to study with DLCO  65/66c % corrects to 94  % for alv volume   - PFT's  06/01/2016  FEV1 1.57 (61 % ) ratio 84  p 22 % improvement from saba p nothing prior to study with DLCO  57/56 % corrects to 87  % for alv volume  - 08/30/2016  Walked RA x 3 laps @ 185 ft each stopped due to  End of study, fast pace, no sob or desat   - PFT's 12/11/2016  No change in dlco   - 6 mw 04/25/2017  = 430 m / no desats Echo 11/12/2018 1. The left ventricle has low normal systolic function, with an ejection  fraction of 50-55%. The cavity size was normal. Left ventricular diastolic  function could not be evaluated secondary to atrial fibrillation.  2. The right ventricle has moderately reduced systolic function. The  cavity was mildly enlarged. There is no increase in right ventricular wall  thickness.  3. Left atrial size was severely dilated.  4. Right atrial size was severely dilated.  5. The mitral valve is grossly normal. There is moderate mitral annular  calcification present.  6. The tricuspid valve is grossly normal. Tricuspid valve regurgitation  is severe.  7. The observed AV gradient is likely normal for this mechanical valve.  8. The aorta is abnormal in size and structure.  9. There is moderate dilatation of the ascending aorta.  10. The inferior vena cava was dilated in size with <50% respiratory  variability.  -06/08/2019   Walked RA x one lap =  approx 250 ft -@ slow  pace stopped due to She became SOB half-way through walk but was able to complete lap. Unable to complete 2nd lap due to SOB despite sats 92% at end  Stopped amiodarone on 06/16/19  PFT's 08/31/2019 mostly restrictive with ERV 6%  Likely  multifactorial :  Obesity/ chronic asthma/ diastolic dysfunction c/b CAF  Clearly not getting this turned around at this point but optimal noct cpap/02 may help the  diastolic dysfunction and daytime energy leval and maybe help get her in neg cal balance so next step is to see sleep medicine/ optimize rx and return to me prn

## 2019-09-01 NOTE — Assessment & Plan Note (Signed)
06/08/2019  Reports desats on cpap/02  - eval 07/18/19 Dr Kathie Dike dx very severe OSA with desats >>>   neeeds cpap 12 cm with no 02   Referred to our sleep medicine clinic as she insists her 02 is still dropping noct

## 2019-09-01 NOTE — Assessment & Plan Note (Signed)
Onset 2017 PFT's  03/21/2016  FEV1 1.50 (58 % ) ratio 80  p 14 % improvement from saba p nothing prior to study with DLCO  65/66c % corrects to 94  % for alv volume   - FENO 04/03/2016  =   7  Allergy profile 04/03/2016 >  Eos 0.1 /  IgE  17 neg RAST - PFT's  06/01/2016  FEV1 1.57 (61 % ) ratio 84  p 22 % improvement from saba p nothing prior to study with DLCO  57/56 % corrects to 87  % for alv volume  - Sinus CT 06/08/2016 > No evidence of sinusitis - trial of dysmista 08/30/2016 > resolved and did not recur off rx as of 12/11/2016  - PFT's  12/11/2016  FEV1 1.59 (63 % ) ratio 80  p 30 % improvement from saba p nothing prior to study with DLCO  61/60 % corrects to 103  % for alv volume  With erv 9% and classic obst curvature  - 12/11/2016    Try symb 160 2bid > worse cough  - 01/22/2017   try symb 80 2bid x 2 weeks> no change - 08/04/2019  After extensive coaching inhaler device,  effectiveness =    0 > 50 % with coaching  - Sinus CT 08/27/2019 1. No evidence of significant paranasal sinus inflammatory disease. 2. Bilateral inferior nasal turbinate hypertrophy. 3. Unchanged leftward nasal septal deviation. >>> rec ent f/u prn nasal obst symptoms .PFT's  08/31/2019  FEV1  0.95 (42 % ) ratio 0.74 (0.63 if use FEV1/VC   p 8 % improvement from saba p nothing prior to study with DLCO  12.17 (61%) corrects to 5.57 (136%)  for alv volume and FV curve moderately concave   08/31/2019  After extensive coaching inhaler device,  effectiveness =    75% > Breztri trial    She doesn't actually meet the strict criteria for airflow obstruction off symbicort x 12 hours prior to the study but the curvature of the f/v and the fev1/vc ratio are very suggestive she has chronic asthma or ACOS and best bet it trial of triple rx using MB and back and monitor for progress and f/u with sleep medicine to see if we can optimize her noct 02.

## 2019-09-02 ENCOUNTER — Other Ambulatory Visit: Payer: Self-pay

## 2019-09-02 MED ORDER — DILTIAZEM HCL ER 240 MG PO CP24: ORAL_CAPSULE | ORAL | 3 refills | Status: DC

## 2019-09-02 NOTE — Progress Notes (Signed)
Patient identification verified, results recent CT maxillofacial w/o contrast reviewed.   Per Dr. Melvyn Novas, study shows swellling of her turbinates which might contribute to nasal congestion but not typically to coughing. Refer to ENT if she doesn't have one or talk to [facility] about it with f/u here.  Patient verbalized understanding of results, she already has an ENT appointment in June and will discuss turbinates at that time.

## 2019-09-14 ENCOUNTER — Ambulatory Visit (INDEPENDENT_AMBULATORY_CARE_PROVIDER_SITE_OTHER): Payer: Medicare Other | Admitting: Pharmacist Clinician (PhC)/ Clinical Pharmacy Specialist

## 2019-09-14 ENCOUNTER — Ambulatory Visit (INDEPENDENT_AMBULATORY_CARE_PROVIDER_SITE_OTHER): Payer: Medicare Other | Admitting: Cardiology

## 2019-09-14 ENCOUNTER — Encounter: Payer: Self-pay | Admitting: Cardiology

## 2019-09-14 ENCOUNTER — Other Ambulatory Visit: Payer: Self-pay

## 2019-09-14 VITALS — BP 124/78 | HR 90 | Ht 67.0 in | Wt 236.8 lb

## 2019-09-14 DIAGNOSIS — I5032 Chronic diastolic (congestive) heart failure: Secondary | ICD-10-CM

## 2019-09-14 DIAGNOSIS — R0602 Shortness of breath: Secondary | ICD-10-CM

## 2019-09-14 DIAGNOSIS — I4821 Permanent atrial fibrillation: Secondary | ICD-10-CM | POA: Diagnosis not present

## 2019-09-14 DIAGNOSIS — I340 Nonrheumatic mitral (valve) insufficiency: Secondary | ICD-10-CM

## 2019-09-14 DIAGNOSIS — I4819 Other persistent atrial fibrillation: Secondary | ICD-10-CM | POA: Diagnosis not present

## 2019-09-14 DIAGNOSIS — I10 Essential (primary) hypertension: Secondary | ICD-10-CM

## 2019-09-14 DIAGNOSIS — I071 Rheumatic tricuspid insufficiency: Secondary | ICD-10-CM

## 2019-09-14 DIAGNOSIS — Z5181 Encounter for therapeutic drug level monitoring: Secondary | ICD-10-CM | POA: Diagnosis not present

## 2019-09-14 DIAGNOSIS — Z952 Presence of prosthetic heart valve: Secondary | ICD-10-CM

## 2019-09-14 LAB — POCT INR: INR: 2.5 (ref 2.0–3.0)

## 2019-09-14 NOTE — Patient Instructions (Addendum)
Medication Instructions:  Your Physician recommend you continue on your current medication as directed.    *If you need a refill on your cardiac medications before your next appointment, please call your pharmacy*   Lab Work: None   Testing/Procedures: None   Follow-Up: At [facility], you and your health needs are our priority.  As part of our continuing mission to provide you with exceptional heart care, we have created designated Provider Care Teams.  These Care Teams include your primary Cardiologist (physician) and Advanced Practice Providers (APPs -  Physician Assistants and Nurse Practitioners) who all work together to provide you with the care you need, when you need it.  We recommend signing up for the patient portal called "MyChart".  Sign up information is provided on this After Visit Summary.  MyChart is used to connect with patients for Virtual Visits (Telemedicine).  Patients are able to view lab/test results, encounter notes, upcoming appointments, etc.  Non-urgent messages can be sent to your provider as well.   To learn more about what you can do with MyChart, go to [email or web address].    Your next appointment:   6 week(s)  The format for your next appointment:   In Person  Provider:   Buford Dresser, MD     Legs look like chronic venous insufficiency. Information attached on this.  Chronic Venous Insufficiency Chronic venous insufficiency is a condition where the leg veins cannot effectively pump blood from the legs to the heart. This happens when the vein walls are either stretched, weakened, or damaged, or when the valves inside the vein are damaged. With the right treatment, you should be able to continue with an active life. This condition is also called venous stasis. What are the causes? Common causes of this condition include:  High blood pressure inside the veins (venous hypertension).  Sitting or standing too long, causing increased  blood pressure in the leg veins.  A blood clot that blocks blood flow in a vein (deep vein thrombosis, DVT).  Inflammation of a vein (phlebitis) that causes a blood clot to form.  Tumors in the pelvis that cause blood to back up. What increases the risk? The following factors may make you more likely to develop this condition:  Having a family history of this condition.  Obesity.  Pregnancy.  Living without enough regular physical activity or exercise (sedentary lifestyle).  Smoking.  Having a job that requires long periods of standing or sitting in one place.  Being a certain age. Women in their 57s and 58s and men in their 63s are more likely to develop this condition. What are the signs or symptoms? Symptoms of this condition include:  Veins that are enlarged, bulging, or twisted (varicose veins).  Skin breakdown or ulcers.  Reddened skin or dark discoloration of skin on the leg between the knee and ankle.  Brown, smooth, tight, and painful skin just above the ankle, usually on the inside of the leg (lipodermatosclerosis).  Swelling of the legs. How is this diagnosed? This condition may be diagnosed based on:  Your medical history.  A physical exam.  Tests, such as: ? A procedure that creates an image of a blood vessel and nearby organs and provides information about blood flow through the blood vessel (duplex ultrasound). ? A procedure that tests blood flow (plethysmography). ? A procedure that looks at the veins using X-ray and dye (venogram). How is this treated? The goals of treatment are to help you return to an active  life and to minimize pain or disability. Treatment depends on the severity of your condition, and it may include:  Wearing compression stockings. These can help relieve symptoms and help prevent your condition from getting worse. However, they do not cure the condition.  Sclerotherapy. This procedure involves an injection of a solution that  shrinks damaged veins.  Surgery. This may involve: ? Removing a diseased vein (vein stripping). ? Cutting off blood flow through the vein (laser ablation surgery). ? Repairing or reconstructing a valve within the affected vein. Follow these instructions at home:      Wear compression stockings as told by your health care provider. These stockings help to prevent blood clots and reduce swelling in your legs.  Take over-the-counter and prescription medicines only as told by your health care provider.  Stay active by exercising, walking, or doing different activities. Ask your health care provider what activities are safe for you and how much exercise you need.  Drink enough fluid to keep your urine pale yellow.  Do not use any products that contain nicotine or tobacco, such as cigarettes, e-cigarettes, and chewing tobacco. If you need help quitting, ask your health care provider.  Keep all follow-up visits as told by your health care provider. This is important. Contact a health care provider if you:  Have redness, swelling, or more pain in the affected area.  See a red streak or line that goes up or down from the affected area.  Have skin breakdown or skin loss in the affected area, even if the breakdown is small.  Get an injury in the affected area. Get help right away if:  You get an injury and an open wound in the affected area.  You have: ? Severe pain that does not get better with medicine. ? Sudden numbness or weakness in the foot or ankle below the affected area. ? Trouble moving your foot or ankle. ? A fever. ? Worse or persistent symptoms. ? Chest pain. ? Shortness of breath. Summary  Chronic venous insufficiency is a condition where the leg veins cannot effectively pump blood from the legs to the heart.  Chronic venous insufficiency occurs when the vein walls become stretched, weakened, or damaged, or when valves within the vein are damaged.  Treatment  depends on how severe your condition is. It often involves wearing compression stockings and may involve having a procedure.  Make sure you stay active by exercising, walking, or doing different activities. Ask your health care provider what activities are safe for you and how much exercise you need. This information is not intended to replace advice given to you by your health care provider. Make sure you discuss any questions you have with your health care provider. Document Revised: 12/17/2017 Document Reviewed: 12/17/2017 [employer] Patient Education  [place].

## 2019-09-14 NOTE — Progress Notes (Signed)
Cardiology Office Note:    Date:  09/14/2019   ID:  [name], DOB 13-Sep-1945, MRN [id number]  PCP:  Maurice Small, MD  Cardiologist:  Buford Dresser, MD PhD  Referring MD: Maurice Small, MD   CC: follow up  History of Present Illness:    [name] is a 74 y.o. female with a hx ofhx of morbid obesity, mechanical AVR in 2006 by Dr. Prescott Gum, atrial fibrillation (see hx below), OSA on CPAPwho is seen for follow up.  History of atrial fibrillation: trialed on tikosyn with DCCV, did not maintain NSR. Amiodarone loaded, then DCCV, but did not maintain NSR, Had afib ablation at [facility], did not maintain, and then had repeat ablation 10/2015 at [facility], placed on amiodarone. Was in paroxysmal atrial fibrillation until around 2020. Monitor 10/2018 suggestive of 100% afib burden, though heart rate was largely regular. ECG in office 11/04/18 done and reviewed, thought to be sinus with long first degree block given the regularity. However, ECG done 06/2019 irregular and without clear P waves, suggestive of atrial fibrillation now being permanent.  Today: Lexiscan normal, has echo scheduled for this week. Shortness of breath continues to get worse, any exertion triggers it. Fatigue still present. Feels like just walking room to room causes her to pant. Has bendopnea as well. Sleeps well, using CPAP with O2. Notes that her monitor says her blood O2 level drops into the 80s overnight. Has upcoming appt to discuss this. Wakes up tired.  Saw Dr. Melvyn Novas 08/31/19, told she is doing well enough that she can follow up with pulmonology as needed.   Rare chest discomfort unchanged, notes most when she is reclining at the end of the day watching TV. Unchanged from prior. Intermittent LE edema, worst in the evening,   Denies shortness of breath at rest. No PND, orthopnea, or unexpected weight gain. No syncope or palpitations.  Skin discoloration bilateral LE consistent with venous insufficiency. Doesn't  wear compression stockings, discussed compression and elevation today.  CPX discussed, but she does not have significant exercise capacity at this point. She is also frustrated as she is trying to lose weight but has been unable.  Trying to do pedal/elliptical with weights every day.   Past Medical History:  Diagnosis Date  . Anxiety   . Aortic stenosis    status post aortic valve replacement with [employer] Jude mechanical prosthesis  . 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   75mm by echo 06/2016 and 71mm by CT angin 2016  . Chronic anticoagulation   . 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 Depression   . DJD (degenerative joint disease) of knee   . Dyslipidemia   . GERD (gastroesophageal reflux disease)    "several years ago; none since" (11/12/2012)  . Hyperlipidemia   . Hyperlipidemia LDL goal <70 01/19/2017  . Hypertension   . Left atrial enlargement   . Mitral regurgitation 06/25/2016   Mild moderate MR by echo 06/2016  . Obesity   . Persistent atrial fibrillation (HCC)    s/p afib ablation x 2 at [facility]  . Sleep apnea    On CPAP at 12cm H2O  . 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 - now cleared by neurosurgery to maintain INR 2-2.5    Past Surgical History:  Procedure Laterality Date  . BURR HOLE FOR SUBDURAL HEMATOMA  2006  . CARDIAC VALVE REPLACEMENT  2006  [employer] Jude AVR  . CARDIOVERSION N/A 07/22/2012   Procedure: CARDIOVERSION;  Surgeon: [name], MD;  Location: [facility] ENDOSCOPY;  Service: Cardiovascular;  Laterality: N/A;  . CARDIOVERSION N/A 11/25/2012   Procedure: CARDIOVERSION;  Surgeon: [name], MD;  Location: [facility] ENDOSCOPY;  Service: Cardiovascular;  Laterality: N/A;  . CARDIOVERSION N/A 12/30/2012   Procedure: CARDIOVERSION;  Surgeon: [name], MD;  Location: [facility] ENDOSCOPY;  Service: Cardiovascular;  Laterality: N/A;  h&p in file-HW   . CARDIOVERSION N/A 03/30/2013   Procedure:  CARDIOVERSION;  Surgeon: [name], MD;  Location: [facility] ENDOSCOPY;  Service: Cardiovascular;  Laterality: N/A;  . ELECTROPHYSIOLOGIC STUDY  11/2013   Afib ablation x 2 (11/2013 and 10/2015) at [facility] by Dr Clyda Hurdle with recurrence post ablation  . KNEE ARTHROSCOPY Left 1980's   "2" (11/12/2012)  . KNEE SURGERY Left 1980's   "after 2 scopes they went in and did some kind of OR" (11/12/2012)  . TEE WITHOUT CARDIOVERSION N/A 07/22/2012   Procedure: TRANSESOPHAGEAL ECHOCARDIOGRAM (TEE);  Surgeon: [name], MD;  Location: [facility] ENDOSCOPY;  Service: Cardiovascular;  Laterality: N/A;  Rm 2034  . TEE WITHOUT CARDIOVERSION N/A 10/07/2013   Procedure: TRANSESOPHAGEAL ECHOCARDIOGRAM (TEE);  Surgeon: [name], MD;  Location: [facility] ENDOSCOPY;  Service: Cardiovascular;  Laterality: N/A;  . TOTAL KNEE ARTHROPLASTY Left 11/05/2014   Procedure: TOTAL KNEE ARTHROPLASTY;  Surgeon: Dorna Leitz, MD;  Location: [place];  Service: Orthopedics;  Laterality: Left;  . TUBAL LIGATION  1980's    Current Medications: Current Outpatient Medications on File Prior to Visit  Medication Sig  . ALPRAZolam (XANAX) 0.5 MG tablet Take 0.5 mg by mouth 2 (two) times daily as needed. AS NEEDED FOR ANXIETY  . calcium-vitamin D (OSCAL WITH D) 500-200 MG-UNIT per tablet Take 2 tablets by mouth daily.   . Cholecalciferol (VITAMIN D) 2000 UNITS tablet Take 4,000 Units by mouth daily.  Marland Kitchen diltiazem (DILT-XR) 240 MG 24 hr capsule TAKE 1 CAPSULE BY MOUTH EVERY MORNING ON AN EMPTY STOMACH  . ferrous sulfate 325 (65 FE) MG tablet Take 325 mg by mouth daily with breakfast.  . furosemide (LASIX) 80 MG tablet Take 80 mg by mouth daily. Additional 80 mg as needed for swelling  . levothyroxine (SYNTHROID, LEVOTHROID) 100 MCG tablet Take 100 mcg daily before breakfast by mouth.  . losartan (COZAAR) 100 MG tablet Take 1 tablet (100 mg total) by mouth daily.  . Multiple Vitamin (MULTIVITAMIN WITH MINERALS) TABS Take 1 tablet by mouth daily.  . potassium  chloride SA (KLOR-CON) 20 MEQ tablet TAKE 2 TABLETS BY MOUTH EVERY MORNING AND 1 TABLET EVERY EVENING  . pravastatin (PRAVACHOL) 20 MG tablet TAKE 1 TABLET BY MOUTH EVERY DAY  . sertraline (ZOLOFT) 50 MG tablet Take 100 mg by mouth daily. Pt takes 75 mg daily   . tretinoin (RETIN-A) 0.05 % cream Apply 1 application topically at bedtime.   Marland Kitchen warfarin (COUMADIN) 4 MG tablet TAKE 1/2 TO 1 TABLET BY MOUTH DAILY AS DIRECTED BY COUMADIN CLINIC  . zolpidem (AMBIEN) 10 MG tablet Take 5 mg by mouth at bedtime as needed for sleep.   No current facility-administered medications on file prior to visit.     Allergies:   Codeine, Beta adrenergic blockers, Metoprolol, Propoxyphene, Toprol xl [metoprolol tartrate], and Dilantin [phenytoin sodium extended]   Social History   Tobacco Use  . Smoking status: Never Smoker  . Smokeless tobacco: Never Used  Substance Use Topics  . Alcohol use: No  Comment: 11/12/2012 "no alcohol in the last 9 years"  . Drug use: No    Family History: The patient's family history includes Heart disease in her brother; Hyperlipidemia in her father; Hypertension in her father and mother; Lung cancer in her mother.  ROS:   Please see the history of present illness.  Additional pertinent ROS otherwise unremarkable except as noted in HPI.    EKGs/Labs/Other Studies Reviewed:    The following studies were reviewed today: Lexiscan nuclear stress test 08/25/19:  There was no ST segment deviation noted during stress.  This is a low risk study.   Patient is in atrial fibrillation with irregular RR interval.  Ejection fraction and volumetric assessment unable to be accurately obtained.  No ischemia or infarction noted on perfusion images.  Overall, low risk study.  echo 11/12/18:  1. The left ventricle has low normal systolic function, with an ejection fraction of 50-55%. The cavity size was normal. Left ventricular diastolic function could not be evaluated  2. The right  ventricle has moderately reduced systolic function. The cavity was mildly enlarged. There is no increase in right ventricular wall thickness.  3. Left atrial size was severely dilated.  4. Right atrial size was severely dilated.  5. The mitral valve is grossly normal. There is moderate mitral annular calcification present.  6. The tricuspid valve is grossly normal. Tricuspid valve regurgitation is severe.  7. The observed AV gradient is likely normal for this mechanical valve.  8. The aorta is abnormal in size and structure.  9. There is moderate dilatation of the ascending aorta. 10. The inferior vena cava was dilated in size with <50% respiratory variability. 11. The interatrial septum was not assessed.  Monitor results, prior echoes also reviewed today  EKG:  EKG is personally reviewed.  The ekg ordered 08/03/19 demonstrates atrial fibrillation at 88 bpm.  Recent Labs: 04/27/2019: Pro B Natriuretic peptide (BNP) 264.0 06/08/2019: BUN 23; Creatinine, Ser 0.94; Hemoglobin 12.4; Platelets 226.0; Potassium 3.9; Sodium 140; TSH 3.25  Recent Lipid Panel    Component Value Date/Time   CHOL 175 05/27/2018 1125   TRIG 100 05/27/2018 1125   HDL 71 05/27/2018 1125   CHOLHDL 2.5 05/27/2018 1125   LDLCALC 84 05/27/2018 1125    Physical Exam:    VS:  BP 124/78   Pulse 90   Ht 5\' 7"  (1.702 m)   Wt 236 lb 12.8 oz (107.4 kg)   SpO2 94%   BMI 37.09 kg/m     Wt Readings from Last 3 Encounters:  09/14/19 236 lb 12.8 oz (107.4 kg)  08/24/19 233 lb (105.7 kg)  08/04/19 233 lb (105.7 kg)    GEN: Well nourished, well developed in no acute distress HEENT: Normal, moist mucous membranes NECK: No sustained JVD but visible TR wave CARDIAC: irregularly irregular rhythm, normal S1 and crisp metallic S2, no rubs or gallops. 2/6 systolic murmur. VASCULAR: Radial and DP pulses 2+ bilaterally. No carotid bruits RESPIRATORY:  Clear to auscultation without rales, wheezing or rhonchi  ABDOMEN: Soft,  non-tender, non-distended MUSCULOSKELETAL:  Ambulates independently SKIN: Warm and dry, no significant LE edema NEUROLOGIC:  Alert and oriented x 3. No focal neuro deficits noted. PSYCHIATRIC:  Normal affect   ASSESSMENT:    1. SOB (shortness of breath)   2. Permanent atrial fibrillation ([place])   3. H/O mechanical aortic valve replacement   4. Severe tricuspid regurgitation   5. Chronic diastolic CHF (congestive heart failure) ([place])   6. Essential hypertension  7. Nonrheumatic mitral valve regurgitation    PLAN:    Shortness of breath: We discussed this at length today. Challenging situation. She feels her symptoms are worsening, but was told her lungs are fine. We had an honest conversation that if her echo this week is normal, it may be difficult to determine the precise cause -echo pending. Valve sounds normal, but exclude elevated aortic valve gradients or worsening MR as a potential cause -no clinical right heart overload on exam, has known severe TR -lexiscan nuclear stress test without ischemia -counseled on red flag warning signs that need immediate medical attention -discussed CPX, cannot exercise well. Could also consider RHC if no clear etiology found. Recommend not crossing mechanical aortic valve for LVEDP  Chronic diastolic heart failure, mild mitral regurgitation, severe tricuspid regurgitation: stable volume status -no change in physical exam today  -continue furosemide 80 mg daily plus PRN  Permanent atrial fibrillation: on coumadin for mechanical valve. Chadsvasc=4 -has extensive treatment history, see prior notes.  -see prior notes, we discussed treatment options extensively -we reviewed again today that given her previous attempts at management, her biatrial enlargement, and the duration of her afib, that it is unlikely we will be able to get her in sustained sinus rhythm -she is currently rate controlled on diltiazem -may be part of her  symptomatology  Mechanical aortic valve -follows with coumadin clinic -stable gradient on prior echo, no change to murmur, but rechecking based on progressive shortness of breath  Hypertension: at goal today -continue diltiazem 240 mg daily, furosemide 80 mg daily, losartan 100 mg daily  Stable ascending aortic aneurysm: 4.4 2018-2019.  OSA: continue CPAP, has appt coming up to discuss her nocturnal desaturations  Cardiac risk counseling and prevention recommendations: -recommend heart healthy/Mediterranean diet, with whole grains, fruits, vegetable, fish, lean meats, nuts, and olive oil. Limit salt. -recommend moderate walking, 3-5 times/week for 30-50 minutes each session. Aim for at least 150 minutes.week. Goal should be pace of 3 miles/hours, or walking 1.5 miles in 30 minutes -recommend avoidance of tobacco products. Avoid excess alcohol. -continue pravastatin for elevated ASCVD risk -ASCVD risk score: The 10-year ASCVD risk score Mikey Bussing DC Brooke Bonito., et al., 2013) is: 17.6%   Values used to calculate the score:     Age: 47 years     Sex: Female     Is Non-Hispanic African American: No     Diabetic: No     Tobacco smoker: No     Systolic Blood Pressure: 614 mmHg     Is BP treated: Yes     HDL Cholesterol: 71 mg/dL     Total Cholesterol: 175 mg/dL    Plan for follow up: 6 weeks to monitor symptoms  Buford Dresser, MD, PhD [place]  [facility]   Medication Adjustments/Labs and Tests Ordered: Current medicines are reviewed at length with the patient today.  Concerns regarding medicines are outlined above.  No orders of the defined types were placed in this encounter.  No orders of the defined types were placed in this encounter.   Patient Instructions  Medication Instructions:  Your Physician recommend you continue on your current medication as directed.    *If you need a refill on your cardiac medications before your next appointment, please call your  pharmacy*   Lab Work: None   Testing/Procedures: None   Follow-Up: At [facility], you and your health needs are our priority.  As part of our continuing mission to provide you with exceptional heart care,  we have created designated Provider Care Teams.  These Care Teams include your primary Cardiologist (physician) and Advanced Practice Providers (APPs -  Physician Assistants and Nurse Practitioners) who all work together to provide you with the care you need, when you need it.  We recommend signing up for the patient portal called "MyChart".  Sign up information is provided on this After Visit Summary.  MyChart is used to connect with patients for Virtual Visits (Telemedicine).  Patients are able to view lab/test results, encounter notes, upcoming appointments, etc.  Non-urgent messages can be sent to your provider as well.   To learn more about what you can do with MyChart, go to [email or web address].    Your next appointment:   6 week(s)  The format for your next appointment:   In Person  Provider:   Buford Dresser, MD     Legs look like chronic venous insufficiency. Information attached on this.  Chronic Venous Insufficiency Chronic venous insufficiency is a condition where the leg veins cannot effectively pump blood from the legs to the heart. This happens when the vein walls are either stretched, weakened, or damaged, or when the valves inside the vein are damaged. With the right treatment, you should be able to continue with an active life. This condition is also called venous stasis. What are the causes? Common causes of this condition include:  High blood pressure inside the veins (venous hypertension).  Sitting or standing too long, causing increased blood pressure in the leg veins.  A blood clot that blocks blood flow in a vein (deep vein thrombosis, DVT).  Inflammation of a vein (phlebitis) that causes a blood clot to form.  Tumors in the pelvis  that cause blood to back up. What increases the risk? The following factors may make you more likely to develop this condition:  Having a family history of this condition.  Obesity.  Pregnancy.  Living without enough regular physical activity or exercise (sedentary lifestyle).  Smoking.  Having a job that requires long periods of standing or sitting in one place.  Being a certain age. Women in their 31s and 26s and men in their 12s are more likely to develop this condition. What are the signs or symptoms? Symptoms of this condition include:  Veins that are enlarged, bulging, or twisted (varicose veins).  Skin breakdown or ulcers.  Reddened skin or dark discoloration of skin on the leg between the knee and ankle.  Brown, smooth, tight, and painful skin just above the ankle, usually on the inside of the leg (lipodermatosclerosis).  Swelling of the legs. How is this diagnosed? This condition may be diagnosed based on:  Your medical history.  A physical exam.  Tests, such as: ? A procedure that creates an image of a blood vessel and nearby organs and provides information about blood flow through the blood vessel (duplex ultrasound). ? A procedure that tests blood flow (plethysmography). ? A procedure that looks at the veins using X-ray and dye (venogram). How is this treated? The goals of treatment are to help you return to an active life and to minimize pain or disability. Treatment depends on the severity of your condition, and it may include:  Wearing compression stockings. These can help relieve symptoms and help prevent your condition from getting worse. However, they do not cure the condition.  Sclerotherapy. This procedure involves an injection of a solution that shrinks damaged veins.  Surgery. This may involve: ? Removing a diseased vein (vein stripping). ? Cutting  off blood flow through the vein (laser ablation surgery). ? Repairing or reconstructing a valve  within the affected vein. Follow these instructions at home:      Wear compression stockings as told by your health care provider. These stockings help to prevent blood clots and reduce swelling in your legs.  Take over-the-counter and prescription medicines only as told by your health care provider.  Stay active by exercising, walking, or doing different activities. Ask your health care provider what activities are safe for you and how much exercise you need.  Drink enough fluid to keep your urine pale yellow.  Do not use any products that contain nicotine or tobacco, such as cigarettes, e-cigarettes, and chewing tobacco. If you need help quitting, ask your health care provider.  Keep all follow-up visits as told by your health care provider. This is important. Contact a health care provider if you:  Have redness, swelling, or more pain in the affected area.  See a red streak or line that goes up or down from the affected area.  Have skin breakdown or skin loss in the affected area, even if the breakdown is small.  Get an injury in the affected area. Get help right away if:  You get an injury and an open wound in the affected area.  You have: ? Severe pain that does not get better with medicine. ? Sudden numbness or weakness in the foot or ankle below the affected area. ? Trouble moving your foot or ankle. ? A fever. ? Worse or persistent symptoms. ? Chest pain. ? Shortness of breath. Summary  Chronic venous insufficiency is a condition where the leg veins cannot effectively pump blood from the legs to the heart.  Chronic venous insufficiency occurs when the vein walls become stretched, weakened, or damaged, or when valves within the vein are damaged.  Treatment depends on how severe your condition is. It often involves wearing compression stockings and may involve having a procedure.  Make sure you stay active by exercising, walking, or doing different activities. Ask  your health care provider what activities are safe for you and how much exercise you need. This information is not intended to replace advice given to you by your health care provider. Make sure you discuss any questions you have with your health care provider. Document Revised: 12/17/2017 Document Reviewed: 12/17/2017 Elsevier Patient Education  2020 [employer].  Signed, Buford Dresser, MD PhD 09/14/19 [place]

## 2019-09-17 ENCOUNTER — Other Ambulatory Visit: Payer: Self-pay

## 2019-09-17 ENCOUNTER — Ambulatory Visit (HOSPITAL_COMMUNITY): Payer: Medicare Other | Attending: Cardiovascular Disease

## 2019-09-17 DIAGNOSIS — R0602 Shortness of breath: Secondary | ICD-10-CM

## 2019-09-17 DIAGNOSIS — Z85828 Personal history of other malignant neoplasm of skin: Secondary | ICD-10-CM | POA: Diagnosis not present

## 2019-09-17 DIAGNOSIS — D485 Neoplasm of uncertain behavior of skin: Secondary | ICD-10-CM | POA: Diagnosis not present

## 2019-09-17 DIAGNOSIS — D692 Other nonthrombocytopenic purpura: Secondary | ICD-10-CM | POA: Diagnosis not present

## 2019-09-17 DIAGNOSIS — I872 Venous insufficiency (chronic) (peripheral): Secondary | ICD-10-CM | POA: Diagnosis not present

## 2019-09-17 DIAGNOSIS — L821 Other seborrheic keratosis: Secondary | ICD-10-CM | POA: Diagnosis not present

## 2019-09-17 DIAGNOSIS — Q828 Other specified congenital malformations of skin: Secondary | ICD-10-CM | POA: Diagnosis not present

## 2019-09-17 DIAGNOSIS — I8312 Varicose veins of left lower extremity with inflammation: Secondary | ICD-10-CM | POA: Diagnosis not present

## 2019-09-17 DIAGNOSIS — I8311 Varicose veins of right lower extremity with inflammation: Secondary | ICD-10-CM | POA: Diagnosis not present

## 2019-09-17 DIAGNOSIS — D1801 Hemangioma of skin and subcutaneous tissue: Secondary | ICD-10-CM | POA: Diagnosis not present

## 2019-09-17 DIAGNOSIS — L814 Other melanin hyperpigmentation: Secondary | ICD-10-CM | POA: Diagnosis not present

## 2019-09-21 ENCOUNTER — Other Ambulatory Visit: Payer: Self-pay

## 2019-09-21 ENCOUNTER — Encounter: Payer: Self-pay | Admitting: Pulmonary Disease

## 2019-09-21 ENCOUNTER — Ambulatory Visit (INDEPENDENT_AMBULATORY_CARE_PROVIDER_SITE_OTHER): Payer: Medicare Other | Admitting: Pulmonary Disease

## 2019-09-21 VITALS — BP 128/78 | HR 100 | Temp 98.1°F | Ht 67.0 in | Wt 233.4 lb

## 2019-09-21 DIAGNOSIS — G4733 Obstructive sleep apnea (adult) (pediatric): Secondary | ICD-10-CM | POA: Diagnosis not present

## 2019-09-21 NOTE — Patient Instructions (Signed)
History of obstructive sleep apnea on CPAP-compliant  Order split-night study she will need oxygen supplementation following adequate treatment of sleep disordered breathing -This is to qualify for oxygen supplementation with CPAP treatment  Graded exercises as tolerated Inhalers as prescribed by Dr. Melvyn Novas  I will see you back in about 6 to 8 weeks  Call with significant concerns  If you can, let us have a copy of your recent study

## 2019-09-21 NOTE — Progress Notes (Signed)
[name]    [id number]    09-22-1945  Primary Care Physician:Webb, Arbie Cookey, MD  Referring Physician: Maurice Small, MD [address] [place] [place],  [place] [address]  Chief complaint:   Patient being seen for daytime fatigue, shortness of breath  HPI:  History of obstructive sleep apnea diagnosed in 2015 Compliant with CPAP use Uses CPAP nightly  She is concerned about desaturations at night She was told about desaturations about 3 to 6 months following initiation of CPAP  She has been wearing a ring that measures her oxygen saturations which also reveals desaturations  She is tired during the day  History of atrial fibrillation History of pulmonary hypertension History of asthma  History of aortic valve disease for which he had repair in the past-has a mechanical aortic valve  She does try to exercise with an elliptical device for about 30 minutes on a regular basis, this is done in a seated position  Outpatient Encounter Medications as of 09/21/2019  Medication Sig  . ALPRAZolam (XANAX) 0.5 MG tablet Take 0.5 mg by mouth 2 (two) times daily as needed. AS NEEDED FOR ANXIETY  . calcium-vitamin D (OSCAL WITH D) 500-200 MG-UNIT per tablet Take 2 tablets by mouth daily.   . Cholecalciferol (VITAMIN D) 2000 UNITS tablet Take 4,000 Units by mouth daily.  Marland Kitchen diltiazem (DILT-XR) 240 MG 24 hr capsule TAKE 1 CAPSULE BY MOUTH EVERY MORNING ON AN EMPTY STOMACH  . ferrous sulfate 325 (65 FE) MG tablet Take 325 mg by mouth daily with breakfast.  . furosemide (LASIX) 80 MG tablet Take 80 mg by mouth daily. Additional 80 mg as needed for swelling  . levothyroxine (SYNTHROID, LEVOTHROID) 100 MCG tablet Take 100 mcg daily before breakfast by mouth.  . losartan (COZAAR) 100 MG tablet Take 1 tablet (100 mg total) by mouth daily.  . Multiple Vitamin (MULTIVITAMIN WITH MINERALS) TABS Take 1 tablet by mouth daily.  . potassium chloride SA (KLOR-CON) 20 MEQ tablet  TAKE 2 TABLETS BY MOUTH EVERY MORNING AND 1 TABLET EVERY EVENING  . pravastatin (PRAVACHOL) 20 MG tablet TAKE 1 TABLET BY MOUTH EVERY DAY  . sertraline (ZOLOFT) 50 MG tablet Take 100 mg by mouth daily. Pt takes 75 mg daily   . tretinoin (RETIN-A) 0.05 % cream Apply 1 application topically at bedtime.   Marland Kitchen warfarin (COUMADIN) 4 MG tablet TAKE 1/2 TO 1 TABLET BY MOUTH DAILY AS DIRECTED BY COUMADIN CLINIC  . zolpidem (AMBIEN) 10 MG tablet Take 5 mg by mouth at bedtime as needed for sleep.   No facility-administered encounter medications on file as of 09/21/2019.    Allergies as of 09/21/2019 - Review Complete 09/21/2019  Allergen Reaction Noted  . Codeine Anaphylaxis 07/17/2012  . Beta adrenergic blockers  10/07/2012  . Metoprolol  10/23/2013  . Propoxyphene Other (See Comments) 10/23/2013  . Toprol xl [metoprolol tartrate]  07/17/2012  . Dilantin [phenytoin sodium extended] Rash and Itching 07/17/2012    Past Medical History:  Diagnosis Date  . Anxiety   . Aortic stenosis    status post aortic valve replacement with St. Jude mechanical prosthesis  . 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   36mm by echo 06/2016 and 69mm by CT angin 2016  . Chronic anticoagulation   . 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 Depression   . DJD (degenerative joint disease) of knee   . Dyslipidemia   .  GERD (gastroesophageal reflux disease)    "several years ago; none since" (11/12/2012)  . Hyperlipidemia   . Hyperlipidemia LDL goal <70 01/19/2017  . Hypertension   . Left atrial enlargement   . Mitral regurgitation 06/25/2016   Mild moderate MR by echo 06/2016  . Obesity   . Persistent atrial fibrillation (HCC)    s/p afib ablation x 2 at [facility]  . Sleep apnea    On CPAP at 12cm H2O  . 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 - now cleared by neurosurgery to maintain INR 2-2.5    Past Surgical History:  Procedure Laterality  Date  . BURR HOLE FOR SUBDURAL HEMATOMA  2006  . CARDIAC VALVE REPLACEMENT  2006   St. Jude AVR  . CARDIOVERSION N/A 07/22/2012   Procedure: CARDIOVERSION;  Surgeon: [name], MD;  Location: [facility] ENDOSCOPY;  Service: Cardiovascular;  Laterality: N/A;  . CARDIOVERSION N/A 11/25/2012   Procedure: CARDIOVERSION;  Surgeon: [name], MD;  Location: [facility] ENDOSCOPY;  Service: Cardiovascular;  Laterality: N/A;  . CARDIOVERSION N/A 12/30/2012   Procedure: CARDIOVERSION;  Surgeon: [name], MD;  Location: [facility] ENDOSCOPY;  Service: Cardiovascular;  Laterality: N/A;  h&p in file-HW   . CARDIOVERSION N/A 03/30/2013   Procedure: CARDIOVERSION;  Surgeon: [name], MD;  Location: [facility] ENDOSCOPY;  Service: Cardiovascular;  Laterality: N/A;  . ELECTROPHYSIOLOGIC STUDY  11/2013   Afib ablation x 2 (11/2013 and 10/2015) at [facility] by Dr Clyda Hurdle with recurrence post ablation  . KNEE ARTHROSCOPY Left 1980's   "2" (11/12/2012)  . KNEE SURGERY Left 1980's   "after 2 scopes they went in and did some kind of OR" (11/12/2012)  . TEE WITHOUT CARDIOVERSION N/A 07/22/2012   Procedure: TRANSESOPHAGEAL ECHOCARDIOGRAM (TEE);  Surgeon: [name], MD;  Location: [facility] ENDOSCOPY;  Service: Cardiovascular;  Laterality: N/A;  Rm 2034  . TEE WITHOUT CARDIOVERSION N/A 10/07/2013   Procedure: TRANSESOPHAGEAL ECHOCARDIOGRAM (TEE);  Surgeon: [name], MD;  Location: [facility] ENDOSCOPY;  Service: Cardiovascular;  Laterality: N/A;  . TOTAL KNEE ARTHROPLASTY Left 11/05/2014   Procedure: TOTAL KNEE ARTHROPLASTY;  Surgeon: Dorna Leitz, MD;  Location: [place];  Service: Orthopedics;  Laterality: Left;  . TUBAL LIGATION  1980's    Family History  Problem Relation Age of Onset  . Lung cancer Mother   . Hypertension Mother   . Hyperlipidemia Father   . Hypertension Father   . Heart disease Brother     Social History   Socioeconomic History  . Marital status: Married    Spouse name: Not on file  . Number of children: Not on file  .  Years of education: Not on file  . Highest education level: Not on file  Occupational History  . Not on file  Tobacco Use  . Smoking status: Never Smoker  . Smokeless tobacco: Never Used  Substance and Sexual Activity  . Alcohol use: No    Comment: 11/12/2012 "no alcohol in the last 9 years"  . Drug use: No  . Sexual activity: Not Currently  Other Topics Concern  . Not on file  Social History Narrative   Pt lives in [place] with spouse.  Retired   Scientist, physiological Strain:   . Difficulty of Paying Living Expenses:   Food Insecurity:   . Worried About Charity fundraiser in the Last Year:   . [place] in the Last  Year:   Transportation Needs:   . Film/video editor (Medical):   Marland Kitchen Lack of Transportation (Non-Medical):   Physical Activity:   . Days of Exercise per Week:   . Minutes of Exercise per Session:   Stress:   . Feeling of Stress :   Social Connections:   . Frequency of Communication with Friends and Family:   . Frequency of Social Gatherings with Friends and Family:   . Attends Religious Services:   . Active Member of Clubs or Organizations:   . Attends Archivist Meetings:   Marland Kitchen Marital Status:   Intimate Partner Violence:   . Fear of Current or Ex-Partner:   . Emotionally Abused:   Marland Kitchen Physically Abused:   . Sexually Abused:     Review of Systems  Constitutional: Positive for fatigue. Negative for fever.  Respiratory: Positive for shortness of breath. Negative for cough.   Cardiovascular: Negative for chest pain.    Vitals:   09/21/19 1131  BP: 128/78  Pulse: 100  Temp: 98.1 F (36.7 C)  SpO2: 97%     Physical Exam Constitutional:      Appearance: She is obese.  HENT:     Head: Normocephalic.     Nose: No congestion.     Mouth/Throat:     Mouth: Mucous membranes are moist.  Eyes:     General:        Right eye: No discharge.        Left eye: No discharge.     Pupils: Pupils are equal,  round, and reactive to light.  Cardiovascular:     Rate and Rhythm: Normal rate and regular rhythm.     Pulses: Normal pulses.     Heart sounds: Normal heart sounds. No murmur heard.  No friction rub.  Pulmonary:     Effort: Pulmonary effort is normal. No respiratory distress.     Breath sounds: No stridor. No wheezing, rhonchi or rales.  Musculoskeletal:     Cervical back: No rigidity or tenderness.  Skin:    General: Skin is warm and dry.  Neurological:     General: No focal deficit present.     Mental Status: She is alert.  Psychiatric:        Mood and Affect: Mood normal.     Data Reviewed: PFT reviewed showing combined obstruction and restriction Previous sleep study significant for severe obstructive sleep apnea Echocardiogram significant for ejection fraction of 30 to 35% with moderately reduced right ventricular systolic function, severe pulmonary hypertension  Assessment:  Obstructive lung disease -Severely reduced FEF 25-75  Restrictive lung disease -Mildly reduced TLC on PFT -Morbid obesity  Heart failure with reduced ejection fraction Diastolic heart failure Paroxysmal atrial fibrillation Hypertension -Low ejection fraction on recent echo -Mention of severe pulmonary hypertension -Dilated atria  Pulmonary hypertension  Aortic valvulopathy Mitral valvulopathy  Deconditioning  Shortness of breath  Obstructive sleep apnea  Plan/Recommendations: We will order a split-night study she will require oxygen supplementation with current CPAP  Graded exercise as tolerated  Continue current inhalers Encouraged to follow-up with Dr. Melvyn Novas  I will follow-up in 6 to 8 weeks   Sherrilyn Rist MD [facility] 09/21/2019, 12:24 PM  CC: Maurice Small, MD

## 2019-09-22 ENCOUNTER — Telehealth: Payer: Self-pay | Admitting: Pulmonary Disease

## 2019-09-22 ENCOUNTER — Telehealth: Payer: Self-pay | Admitting: Cardiology

## 2019-09-22 NOTE — Telephone Encounter (Signed)
New Message   Pt is returning call to [facility] for results    Please call back

## 2019-09-22 NOTE — Telephone Encounter (Signed)
Will route to [facility] for follow up.

## 2019-09-22 NOTE — Telephone Encounter (Signed)
Polysomnogram report placed in Dr. Judson Roch box for review and scan.

## 2019-09-22 NOTE — Telephone Encounter (Signed)
Pt updated with ECHO results and verbalized understanding. Appointment scheduled for 6/22 with Jory Sims, DNP.

## 2019-09-23 ENCOUNTER — Telehealth: Payer: Self-pay | Admitting: Pulmonary Disease

## 2019-09-23 DIAGNOSIS — G4733 Obstructive sleep apnea (adult) (pediatric): Secondary | ICD-10-CM

## 2019-09-23 NOTE — Telephone Encounter (Signed)
Recent sleep study performed 07/18/2019 was reviewed  This reveals severe obstructive sleep apnea with an AHI of 81.2, nadir of desaturations of 68%  Oxygen supplementation was not provided during the study  Part of a notation in the recommendation shows patient on CPAP of 12 with very good control on a recent download Recommendation was to continue CPAP  Patient will require a titration study to CPAP with oxygen supplementation added as needed    Will have study scanned into chart

## 2019-09-24 ENCOUNTER — Other Ambulatory Visit (HOSPITAL_COMMUNITY): Payer: Self-pay

## 2019-09-24 ENCOUNTER — Other Ambulatory Visit: Payer: Self-pay

## 2019-09-24 ENCOUNTER — Emergency Department (HOSPITAL_COMMUNITY): Payer: Medicare Other

## 2019-09-24 ENCOUNTER — Inpatient Hospital Stay (HOSPITAL_COMMUNITY)
Admission: RE | Admit: 2019-09-24 | Discharge: 2019-09-30 | DRG: 291 | Disposition: A | Discharge status: Home / Self Care | Payer: Medicare Other | Attending: Internal Medicine | Admitting: Internal Medicine

## 2019-09-24 DIAGNOSIS — Z952 Presence of prosthetic heart valve: Secondary | ICD-10-CM

## 2019-09-24 DIAGNOSIS — G4733 Obstructive sleep apnea (adult) (pediatric): Secondary | ICD-10-CM | POA: Diagnosis present

## 2019-09-24 DIAGNOSIS — Z6836 Body mass index (BMI) 36.0-36.9, adult: Secondary | ICD-10-CM

## 2019-09-24 DIAGNOSIS — I081 Rheumatic disorders of both mitral and tricuspid valves: Secondary | ICD-10-CM | POA: Diagnosis present

## 2019-09-24 DIAGNOSIS — E785 Hyperlipidemia, unspecified: Secondary | ICD-10-CM | POA: Diagnosis present

## 2019-09-24 DIAGNOSIS — E876 Hypokalemia: Secondary | ICD-10-CM | POA: Diagnosis present

## 2019-09-24 DIAGNOSIS — I4821 Permanent atrial fibrillation: Secondary | ICD-10-CM | POA: Diagnosis present

## 2019-09-24 DIAGNOSIS — Z83438 Family history of other disorder of lipoprotein metabolism and other lipidemia: Secondary | ICD-10-CM

## 2019-09-24 DIAGNOSIS — Z79890 Hormone replacement therapy (postmenopausal): Secondary | ICD-10-CM

## 2019-09-24 DIAGNOSIS — Z96652 Presence of left artificial knee joint: Secondary | ICD-10-CM | POA: Diagnosis present

## 2019-09-24 DIAGNOSIS — Z8679 Personal history of other diseases of the circulatory system: Secondary | ICD-10-CM

## 2019-09-24 DIAGNOSIS — R0602 Shortness of breath: Secondary | ICD-10-CM | POA: Diagnosis not present

## 2019-09-24 DIAGNOSIS — I77810 Thoracic aortic ectasia: Secondary | ICD-10-CM | POA: Diagnosis present

## 2019-09-24 DIAGNOSIS — R0902 Hypoxemia: Secondary | ICD-10-CM

## 2019-09-24 DIAGNOSIS — J9601 Acute respiratory failure with hypoxia: Secondary | ICD-10-CM | POA: Diagnosis present

## 2019-09-24 DIAGNOSIS — I10 Essential (primary) hypertension: Secondary | ICD-10-CM | POA: Diagnosis present

## 2019-09-24 DIAGNOSIS — I110 Hypertensive heart disease with heart failure: Secondary | ICD-10-CM | POA: Diagnosis not present

## 2019-09-24 DIAGNOSIS — Z888 Allergy status to other drugs, medicaments and biological substances status: Secondary | ICD-10-CM

## 2019-09-24 DIAGNOSIS — Z8249 Family history of ischemic heart disease and other diseases of the circulatory system: Secondary | ICD-10-CM

## 2019-09-24 DIAGNOSIS — F329 Major depressive disorder, single episode, unspecified: Secondary | ICD-10-CM | POA: Diagnosis present

## 2019-09-24 DIAGNOSIS — K219 Gastro-esophageal reflux disease without esophagitis: Secondary | ICD-10-CM | POA: Diagnosis present

## 2019-09-24 DIAGNOSIS — Z20822 Contact with and (suspected) exposure to covid-19: Secondary | ICD-10-CM | POA: Diagnosis present

## 2019-09-24 DIAGNOSIS — E039 Hypothyroidism, unspecified: Secondary | ICD-10-CM

## 2019-09-24 DIAGNOSIS — I248 Other forms of acute ischemic heart disease: Secondary | ICD-10-CM | POA: Diagnosis present

## 2019-09-24 DIAGNOSIS — Z885 Allergy status to narcotic agent status: Secondary | ICD-10-CM

## 2019-09-24 DIAGNOSIS — I5043 Acute on chronic combined systolic (congestive) and diastolic (congestive) heart failure: Secondary | ICD-10-CM

## 2019-09-24 DIAGNOSIS — I509 Heart failure, unspecified: Secondary | ICD-10-CM

## 2019-09-24 DIAGNOSIS — Z7901 Long term (current) use of anticoagulants: Secondary | ICD-10-CM

## 2019-09-24 DIAGNOSIS — I428 Other cardiomyopathies: Secondary | ICD-10-CM | POA: Diagnosis present

## 2019-09-24 DIAGNOSIS — Z79899 Other long term (current) drug therapy: Secondary | ICD-10-CM

## 2019-09-24 DIAGNOSIS — F419 Anxiety disorder, unspecified: Secondary | ICD-10-CM | POA: Diagnosis present

## 2019-09-24 DIAGNOSIS — Z801 Family history of malignant neoplasm of trachea, bronchus and lung: Secondary | ICD-10-CM

## 2019-09-24 LAB — URINALYSIS, ROUTINE W REFLEX MICROSCOPIC
Bilirubin Urine: NEGATIVE
Glucose, UA: NEGATIVE mg/dL
Hgb urine dipstick: NEGATIVE
Ketones, ur: NEGATIVE mg/dL
Leukocytes,Ua: NEGATIVE
Nitrite: NEGATIVE
Protein, ur: NEGATIVE mg/dL
Specific Gravity, Urine: 1.005 (ref 1.005–1.030)
pH: 7 (ref 5.0–8.0)

## 2019-09-24 LAB — CBC WITH DIFFERENTIAL/PLATELET
Abs Immature Granulocytes: 0.03 10*3/uL (ref 0.00–0.07)
Basophils Absolute: 0 10*3/uL (ref 0.0–0.1)
Basophils Relative: 0 %
Eosinophils Absolute: 0 10*3/uL (ref 0.0–0.5)
Eosinophils Relative: 0 %
HCT: 39.7 % (ref 36.0–46.0)
Hemoglobin: 12.9 g/dL (ref 12.0–15.0)
Immature Granulocytes: 0 %
Lymphocytes Relative: 8 %
Lymphs Abs: 0.7 10*3/uL (ref 0.7–4.0)
MCH: 31.2 pg (ref 26.0–34.0)
MCHC: 32.5 g/dL (ref 30.0–36.0)
MCV: 95.9 fL (ref 80.0–100.0)
Monocytes Absolute: 1.1 10*3/uL — ABNORMAL HIGH (ref 0.1–1.0)
Monocytes Relative: 12 %
Neutro Abs: 7.5 10*3/uL (ref 1.7–7.7)
Neutrophils Relative %: 80 %
Platelets: 172 10*3/uL (ref 150–400)
RBC: 4.14 MIL/uL (ref 3.87–5.11)
RDW: 17 % — ABNORMAL HIGH (ref 11.5–15.5)
WBC: 9.4 10*3/uL (ref 4.0–10.5)
nRBC: 0 % (ref 0.0–0.2)

## 2019-09-24 LAB — LACTIC ACID, PLASMA
Lactic Acid, Venous: 1 mmol/L (ref 0.5–1.9)
Lactic Acid, Venous: 1.2 mmol/L (ref 0.5–1.9)

## 2019-09-24 LAB — COMPREHENSIVE METABOLIC PANEL
ALT: 20 U/L (ref 0–44)
AST: 24 U/L (ref 15–41)
Albumin: 3.6 g/dL (ref 3.5–5.0)
Alkaline Phosphatase: 157 U/L — ABNORMAL HIGH (ref 38–126)
Anion gap: 11 (ref 5–15)
BUN: 12 mg/dL (ref 8–23)
CO2: 26 mmol/L (ref 22–32)
Calcium: 8.8 mg/dL — ABNORMAL LOW (ref 8.9–10.3)
Chloride: 102 mmol/L (ref 98–111)
Creatinine, Ser: 0.89 mg/dL (ref 0.44–1.00)
GFR calc Af Amer: >60 mL/min (ref >60)
GFR calc non Af Amer: >60 mL/min (ref >60)
Glucose, Bld: 111 mg/dL — ABNORMAL HIGH (ref 70–99)
Potassium: 3.4 mmol/L — ABNORMAL LOW (ref 3.5–5.1)
Sodium: 139 mmol/L (ref 135–145)
Total Bilirubin: 1.4 mg/dL — ABNORMAL HIGH (ref 0.3–1.2)
Total Protein: 6.8 g/dL (ref 6.5–8.1)

## 2019-09-24 LAB — TROPONIN I (HIGH SENSITIVITY)
Troponin I (High Sensitivity): 26 ng/L — ABNORMAL HIGH (ref <18)
Troponin I (High Sensitivity): 30 ng/L — ABNORMAL HIGH (ref <18)

## 2019-09-24 LAB — SARS CORONAVIRUS 2 BY RT PCR (HOSPITAL ORDER, PERFORMED IN ~~LOC~~ HOSPITAL LAB): SARS Coronavirus 2: NEGATIVE

## 2019-09-24 LAB — BRAIN NATRIURETIC PEPTIDE: B Natriuretic Peptide: 696.5 pg/mL — ABNORMAL HIGH (ref 0.0–100.0)

## 2019-09-24 LAB — PROTIME-INR
INR: 2.5 — ABNORMAL HIGH (ref 0.8–1.2)
Prothrombin Time: 25.8 seconds — ABNORMAL HIGH (ref 11.4–15.2)

## 2019-09-24 LAB — LIPASE, BLOOD: Lipase: 16 U/L (ref 11–51)

## 2019-09-24 MED ORDER — ACETAMINOPHEN 325 MG PO TABS: 650.0000 mg | ORAL_TABLET | ORAL | Status: DC | PRN

## 2019-09-24 MED ORDER — WARFARIN SODIUM 2 MG PO TABS
2.0000 mg | ORAL_TABLET | Freq: Once | ORAL | Status: AC
  Administered 2019-09-24: 2 mg via ORAL
  Filled 2019-09-24: qty 1

## 2019-09-24 MED ORDER — SODIUM CHLORIDE 0.9 % IV SOLN: 250.0000 mL | INTRAVENOUS | Status: DC | PRN

## 2019-09-24 MED ORDER — DILTIAZEM HCL ER 240 MG PO CP24: 240.0000 mg | ORAL_CAPSULE | Freq: Every day | ORAL | Status: DC

## 2019-09-24 MED ORDER — WARFARIN - PHARMACIST DOSING INPATIENT: Freq: Every day | Status: DC

## 2019-09-24 MED ORDER — ONDANSETRON HCL 4 MG/2ML IJ SOLN: 4.0000 mg | Freq: Four times a day (QID) | INTRAMUSCULAR | Status: DC | PRN

## 2019-09-24 MED ORDER — POTASSIUM CHLORIDE 20 MEQ/15ML (10%) PO SOLN
40.0000 meq | Freq: Every day | ORAL | Status: DC
  Administered 2019-09-24 – 2019-09-29 (×6): 40 meq via ORAL
  Filled 2019-09-24 (×6): qty 30

## 2019-09-24 MED ORDER — SODIUM CHLORIDE 0.9% FLUSH: 3.0000 mL | INTRAVENOUS | Status: DC | PRN

## 2019-09-24 MED ORDER — LEVOTHYROXINE SODIUM 100 MCG PO TABS
100.0000 ug | ORAL_TABLET | Freq: Every day | ORAL | Status: DC
  Administered 2019-09-25 – 2019-09-30 (×6): 100 ug via ORAL
  Filled 2019-09-24 (×6): qty 1

## 2019-09-24 MED ORDER — FUROSEMIDE 10 MG/ML IJ SOLN
40.0000 mg | Freq: Once | INTRAMUSCULAR | Status: AC
  Administered 2019-09-24: 40 mg via INTRAVENOUS
  Filled 2019-09-24: qty 4

## 2019-09-24 MED ORDER — SODIUM CHLORIDE 0.9% FLUSH
3.0000 mL | Freq: Two times a day (BID) | INTRAVENOUS | Status: DC
  Administered 2019-09-25 – 2019-09-30 (×10): 3 mL via INTRAVENOUS

## 2019-09-24 MED ORDER — LOSARTAN POTASSIUM 50 MG PO TABS
100.0000 mg | ORAL_TABLET | Freq: Every day | ORAL | Status: DC
  Administered 2019-09-25 – 2019-09-26 (×2): 100 mg via ORAL
  Filled 2019-09-24 (×2): qty 2

## 2019-09-24 MED ORDER — PRAVASTATIN SODIUM 10 MG PO TABS
20.0000 mg | ORAL_TABLET | Freq: Every day | ORAL | Status: DC
  Administered 2019-09-24 – 2019-09-30 (×7): 20 mg via ORAL
  Filled 2019-09-24 (×7): qty 2

## 2019-09-24 MED ORDER — FUROSEMIDE 10 MG/ML IJ SOLN
60.0000 mg | Freq: Two times a day (BID) | INTRAMUSCULAR | Status: DC
  Administered 2019-09-25 – 2019-09-29 (×9): 60 mg via INTRAVENOUS
  Filled 2019-09-24 (×9): qty 6

## 2019-09-24 NOTE — ED Notes (Signed)
Ambulated Pt to Bathroom in [place], used the restroom and Back to room without O2. Pt's Pulse Ox went from 99% to 86% without O2.

## 2019-09-24 NOTE — Progress Notes (Signed)
ANTICOAGULATION CONSULT NOTE - Initial Consult  Pharmacy Consult for Warfarin Indication: Afib and MAVR  Allergies  Allergen Reactions  . Codeine Anaphylaxis    Jerking, involuntary jerking   . Beta Adrenergic Blockers     Makes her crazy Makes her crazy  Makes her crazy  . Metoprolol     depression depression  . Propoxyphene Other (See Comments)    Bad vivid dreams Bad vivid dreams  . Toprol Xl [Metoprolol Tartrate]     depression  . Dilantin [Phenytoin Sodium Extended] Rash and Itching    Patient Measurements: Height: 5\' 7"  (170.2 cm) Weight: 106.1 kg (234 lb) IBW/kg (Calculated) : 61.6 Heparin Dosing Weight:   Vital Signs: BP: 119/63 (06/17 1900) Pulse Rate: 73 (06/17 2139)  Labs: Recent Labs    09/24/19 1742 09/24/19 1856  HGB 12.9  --   HCT 39.7  --   PLT 172  --   LABPROT 25.8*  --   INR 2.5*  --   CREATININE 0.89  --   TROPONINIHS 26* 30*    Estimated Creatinine Clearance: 69.5 mL/min (by C-G formula based on SCr of 0.89 mg/dL).   Medical History: Past Medical History:  Diagnosis Date  . Anxiety   . Aortic stenosis    status post aortic valve replacement with St. Jude mechanical prosthesis  . 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   52mm by echo 06/2016 and 81mm by CT angin 2016  . Chronic anticoagulation   . 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 Depression   . DJD (degenerative joint disease) of knee   . Dyslipidemia   . GERD (gastroesophageal reflux disease)    "several years ago; none since" (11/12/2012)  . Hyperlipidemia   . Hyperlipidemia LDL goal <70 01/19/2017  . Hypertension   . Left atrial enlargement   . Mitral regurgitation 06/25/2016   Mild moderate MR by echo 06/2016  . Obesity   . Persistent atrial fibrillation (HCC)    s/p afib ablation x 2 at [facility]  . Sleep apnea    On CPAP at 12cm H2O  . 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 - now cleared  by neurosurgery to maintain INR 2-2.5    Medications:  Scheduled:  . [START ON 09/25/2019] furosemide  60 mg Intravenous Q12H  . [START ON 09/25/2019] levothyroxine  100 mcg Oral Q0600  . [START ON 09/25/2019] losartan  100 mg Oral Daily  . potassium chloride  40 mEq Oral Daily  . pravastatin  20 mg Oral Daily  . sodium chloride flush  3 mL Intravenous Q12H  . warfarin  2 mg Oral ONCE-1600  . [START ON 09/25/2019] Warfarin - Pharmacist Dosing Inpatient   Does not apply q1600    Assessment: Patient is a 57 yof that presented to the ED with SOB the patient is on warfarin PTA for Afib and MAVR. Patient had a subdural hematoma prior and INR goal was reduced to 2-2.5. Pharmacy has been asked to dose Warfarin while inpatient.   PTA regimen: 2 mg Wed & Sun and 4 mg ROW   Goal of Therapy:  2-2.5 Monitor platelets by anticoagulation protocol: Yes   Plan:  - Patient took 2 mg last evening with inpatient and at top of range will give Warfarin 2mg  PO x 1 dose tonight  - Daily PT-INR and cbc while on warfarin     Duanne Limerick PharmD.  BCPS 09/24/2019,10:25 PM

## 2019-09-24 NOTE — ED Triage Notes (Signed)
Pt coming by EMS after developing shob for about 3 days, with it getting worse today. Pt does not use O2 @ home, other than CPAP @ night. Slight temp @ 99.5. 88%-89% on RA, placed on 2L [place] by EMS and pt O2 sat increased to 95%. Hx of CHF, takes Lasix, aortic valve replacement in 2006, takes Coumadin. Pt alert and oriented.

## 2019-09-24 NOTE — H&P (Signed)
History and Physical    [name] PRF:[id number] DOB: May 25, 1945 DOA: 09/24/2019  PCP: Maurice Small, MD  Patient coming from: Home  I have personally briefly reviewed patient's old medical records in [place]  Chief Complaint: Shortness of breath  HPI: [name] is a 74 y.o. female with medical history significant for HFrEF (EF decreased at 30-35% by TTE 09/17/2019), AS s/p mechanical AVR, permanent A. fib, chronic anticoagulation with Coumadin (goal INR 2-2.5 due to prior history of SDH), hypertension, hyperlipidemia, ascending aortic aneurysm, and OSA on CPAP who presents to the ED for evaluation of shortness of breath.  Patient states over the last several days she has been having worsening shortness of breath, dyspnea on exertion, swelling in her ankles and lower legs, and orthopnea.  She reports cough every morning which is unchanged over the last 3 months.  She reports intermittent palpitations due to her atrial fibrillation.  She denies any chest pain, nausea, vomiting, abdominal pain.  She reports good urine output without dysuria.  She denies any subjective fevers, chills, diaphoresis.  She says she has been taking her Lasix daily.  Her shortness of breath was persistent and worsening today therefore she called EMS.  Per ED triage notes, O2 saturation was 88-89% on room air and she placed on 2 L supplemental O2 via [place] by EMS and brought to the ED for further evaluation.  ED Course:  Initial vitals showed BP 128/84, pulse 104, RR 25, SPO2 88-89% on room air per ED triage notes.  SPO2 improved to 96% on 2 L supplemental O2 via [place].  Labs are notable for sodium 139, potassium 3.4, bicarb 26, BUN 12, creatinine 0.89, WBC 9.4, hemoglobin 12.9, platelets 172,000, lactic acid 1.0, 1.2, high-sensitivity troponin I 26 >> 30, INR 2.5, BNP 696.5, lipase 16.  SARS-CoV-2 PCR is negative.  Portable chest x-ray shows prior sternotomy changes with marked central vascular  congestion/pulmonary edema.  Patient was given IV Lasix 40 mg once and the hospitalist service was consulted to admit for further evaluation management.  Review of Systems: All systems reviewed and are negative except as documented in history of present illness above.   Past Medical History:  Diagnosis Date  . Anxiety   . Aortic stenosis    status post aortic valve replacement with St. Jude mechanical prosthesis  . 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   35mm by echo 06/2016 and 69mm by CT angin 2016  . Chronic anticoagulation   . 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 Depression   . DJD (degenerative joint disease) of knee   . Dyslipidemia   . GERD (gastroesophageal reflux disease)    "several years ago; none since" (11/12/2012)  . Hyperlipidemia   . Hyperlipidemia LDL goal <70 01/19/2017  . Hypertension   . Left atrial enlargement   . Mitral regurgitation 06/25/2016   Mild moderate MR by echo 06/2016  . Obesity   . Persistent atrial fibrillation (HCC)    s/p afib ablation x 2 at [facility]  . Sleep apnea    On CPAP at 12cm H2O  . 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 - now cleared by neurosurgery to maintain INR 2-2.5    Past Surgical History:  Procedure Laterality Date  . BURR HOLE FOR SUBDURAL HEMATOMA  2006  . CARDIAC VALVE REPLACEMENT  2006   St. Jude AVR  . CARDIOVERSION N/A 07/22/2012  Procedure: CARDIOVERSION;  Surgeon: [name], MD;  Location: [facility] ENDOSCOPY;  Service: Cardiovascular;  Laterality: N/A;  . CARDIOVERSION N/A 11/25/2012   Procedure: CARDIOVERSION;  Surgeon: [name], MD;  Location: [facility] ENDOSCOPY;  Service: Cardiovascular;  Laterality: N/A;  . CARDIOVERSION N/A 12/30/2012   Procedure: CARDIOVERSION;  Surgeon: [name], MD;  Location: [facility] ENDOSCOPY;  Service: Cardiovascular;  Laterality: N/A;  h&p in file-HW   . CARDIOVERSION N/A 03/30/2013   Procedure: CARDIOVERSION;   Surgeon: [name], MD;  Location: [facility] ENDOSCOPY;  Service: Cardiovascular;  Laterality: N/A;  . ELECTROPHYSIOLOGIC STUDY  11/2013   Afib ablation x 2 (11/2013 and 10/2015) at [facility] by Dr Clyda Hurdle with recurrence post ablation  . KNEE ARTHROSCOPY Left 1980's   "2" (11/12/2012)  . KNEE SURGERY Left 1980's   "after 2 scopes they went in and did some kind of OR" (11/12/2012)  . TEE WITHOUT CARDIOVERSION N/A 07/22/2012   Procedure: TRANSESOPHAGEAL ECHOCARDIOGRAM (TEE);  Surgeon: [name], MD;  Location: [facility] ENDOSCOPY;  Service: Cardiovascular;  Laterality: N/A;  Rm 2034  . TEE WITHOUT CARDIOVERSION N/A 10/07/2013   Procedure: TRANSESOPHAGEAL ECHOCARDIOGRAM (TEE);  Surgeon: [name], MD;  Location: [facility] ENDOSCOPY;  Service: Cardiovascular;  Laterality: N/A;  . TOTAL KNEE ARTHROPLASTY Left 11/05/2014   Procedure: TOTAL KNEE ARTHROPLASTY;  Surgeon: Dorna Leitz, MD;  Location: [place];  Service: Orthopedics;  Laterality: Left;  . TUBAL LIGATION  1980's    Social History:  reports that she has never smoked. She has never used smokeless tobacco. She reports that she does not drink alcohol and does not use drugs.  Allergies  Allergen Reactions  . Codeine Anaphylaxis    Jerking, involuntary jerking   . Beta Adrenergic Blockers     Makes her crazy Makes her crazy  Makes her crazy  . Metoprolol     depression depression  . Propoxyphene Other (See Comments)    Bad vivid dreams Bad vivid dreams  . Toprol Xl [Metoprolol Tartrate]     depression  . Dilantin [Phenytoin Sodium Extended] Rash and Itching    Family History  Problem Relation Age of Onset  . Lung cancer Mother   . Hypertension Mother   . Hyperlipidemia Father   . Hypertension Father   . Heart disease Brother      Prior to Admission medications   Medication Sig Start Date End Date Taking? Authorizing Provider  ALPRAZolam Duanne Moron) 0.5 MG tablet Take 0.5 mg by mouth 2 (two) times daily as needed. AS NEEDED FOR ANXIETY 01/11/17    [provider]  calcium-vitamin D (OSCAL WITH D) 500-200 MG-UNIT per tablet Take 2 tablets by mouth daily.     [provider]  Cholecalciferol (VITAMIN D) 2000 UNITS tablet Take 4,000 Units by mouth daily.    [provider]  diltiazem (DILT-XR) 240 MG 24 hr capsule TAKE 1 CAPSULE BY MOUTH EVERY MORNING ON AN EMPTY STOMACH 09/02/19   Buford Dresser, MD  ferrous sulfate 325 (65 FE) MG tablet Take 325 mg by mouth daily with breakfast.    [provider]  furosemide (LASIX) 80 MG tablet Take 80 mg by mouth daily. Additional 80 mg as needed for swelling    [provider]  levothyroxine (SYNTHROID, LEVOTHROID) 100 MCG tablet Take 100 mcg daily before breakfast by mouth.    [provider]  losartan (COZAAR) 100 MG tablet Take 1 tablet (100 mg total) by mouth daily. 12/17/18   Buford Dresser, MD  Multiple Vitamin (  MULTIVITAMIN WITH MINERALS) TABS Take 1 tablet by mouth daily.    [provider]  potassium chloride SA (KLOR-CON) 20 MEQ tablet TAKE 2 TABLETS BY MOUTH EVERY MORNING AND 1 TABLET EVERY EVENING 01/13/19   Buford Dresser, MD  pravastatin (PRAVACHOL) 20 MG tablet TAKE 1 TABLET BY MOUTH EVERY DAY 09/16/18   Buford Dresser, MD  sertraline (ZOLOFT) 50 MG tablet Take 100 mg by mouth daily. Pt takes 75 mg daily     [provider]  tretinoin (RETIN-A) 0.05 % cream Apply 1 application topically at bedtime.     [provider]  warfarin (COUMADIN) 4 MG tablet TAKE 1/2 TO 1 TABLET BY MOUTH DAILY AS DIRECTED BY COUMADIN CLINIC 08/17/19   Buford Dresser, MD  zolpidem (AMBIEN) 10 MG tablet Take 5 mg by mouth at bedtime as needed for sleep.    [provider]    Physical Exam: Vitals:   09/24/19 1608 09/24/19 1715 09/24/19 1900 09/24/19 2020  BP:  (!) 99/56 119/63   Pulse: 100 89 77 77  Resp: (!) 25 (!) 22 (!) 22 (!) 24  SpO2: 96% 99% 98% 100%  Weight:      Height:        Constitutional: Resting supine in bed with head slightly elevated, NAD, calm, comfortable Eyes: PERRL, lids and conjunctivae normal ENMT: Mucous membranes are moist. Posterior pharynx clear of any exudate or lesions.Normal dentition.  Neck: normal, supple, no masses. Respiratory: Bibasilar inspiratory crackles.  Normal respiratory effort. No accessory muscle use.  Cardiovascular: Irregularly irregular with valvular click.  Compression stockings in place bilateral lower extremities with trace edema. 2+ pedal pulses. Abdomen: no tenderness, no masses palpated. No hepatosplenomegaly. Bowel sounds positive.  Musculoskeletal: no clubbing / cyanosis. No joint deformity upper and lower extremities. Good ROM, no contractures. Normal muscle tone.  Skin: no rashes, lesions, ulcers. No induration Neurologic: CN 2-12 grossly intact. Sensation intact, Strength 5/5 in all 4.  Psychiatric: Normal judgment and insight. Alert and oriented x 3. Normal mood.    Labs on Admission: I have personally reviewed following labs and imaging studies  CBC: Recent Labs  Lab 09/24/19 1742  WBC 9.4  NEUTROABS 7.5  HGB 12.9  HCT 39.7  MCV 95.9  PLT 283   Basic Metabolic Panel: Recent Labs  Lab 09/24/19 1742  NA 139  K 3.4*  CL 102  CO2 26  GLUCOSE 111*  BUN 12  CREATININE 0.89  CALCIUM 8.8*   GFR: Estimated Creatinine Clearance: 69.5 mL/min (by C-G formula based on SCr of 0.89 mg/dL). Liver Function Tests: Recent Labs  Lab 09/24/19 1742  AST 24  ALT 20  ALKPHOS 157*  BILITOT 1.4*  PROT 6.8  ALBUMIN 3.6   Recent Labs  Lab 09/24/19 1742  LIPASE 16   No results for input(s): AMMONIA in the last 168 hours. Coagulation Profile: Recent Labs  Lab 09/24/19 1742  INR 2.5*   Cardiac Enzymes: No results for input(s): CKTOTAL, CKMB, CKMBINDEX, TROPONINI in the last 168 hours. BNP (last 3 results) Recent Labs    04/27/19 1105  PROBNP 264.0*   HbA1C: No results for input(s): HGBA1C in  the last 72 hours. CBG: No results for input(s): GLUCAP in the last 168 hours. Lipid Profile: No results for input(s): CHOL, HDL, LDLCALC, TRIG, CHOLHDL, LDLDIRECT in the last 72 hours. Thyroid Function Tests: No results for input(s): TSH, T4TOTAL, FREET4, T3FREE, THYROIDAB in the last 72 hours. Anemia Panel: No results for input(s): VITAMINB12, FOLATE, FERRITIN,  TIBC, IRON, RETICCTPCT in the last 72 hours. Urine analysis:    Component Value Date/Time   COLORURINE STRAW (A) 09/24/2019 1726   APPEARANCEUR CLEAR 09/24/2019 1726   LABSPEC 1.005 09/24/2019 1726   PHURINE 7.0 09/24/2019 1726   GLUCOSEU NEGATIVE 09/24/2019 1726   HGBUR NEGATIVE 09/24/2019 1726   BILIRUBINUR NEGATIVE 09/24/2019 1726   KETONESUR NEGATIVE 09/24/2019 1726   PROTEINUR NEGATIVE 09/24/2019 1726   NITRITE NEGATIVE 09/24/2019 1726   LEUKOCYTESUR NEGATIVE 09/24/2019 1726    Radiological Exams on Admission: DG Chest Portable 1 View  Result Date: 09/24/2019 CLINICAL DATA:  Fever, cough, shortness of breath and history of CHF EXAM: PORTABLE CHEST 1 VIEW COMPARISON:  Radiograph 08/04/2019 FINDINGS: Lung apices are partially obscured by the patient's chin and neck soft tissues. Lung volumes are low with some atelectatic changes towards the bases. There is marked central vascular congestion with hazy, indistinct pulmonary vascularity. Perihilar and basilar predominant interstitial opacities are present with peripheral septal lines. In the more focal bandlike opacity in the left mid lung could reflect further septal thickening, fissural thickening or atelectatic change. No pneumothorax or visible effusion. Cardiomegaly similar to comparisons with postsurgical changes from prior sternotomy and likely CABG with aortic valve replacement. Telemetry leads overlie the chest. No acute osseous or soft tissue abnormality. Degenerative changes are present in the imaged spine and shoulders. IMPRESSION: 1. Features most suggestive of  CHF/volume overload with cardiomegaly, pulmonary edema. 2. Superimposed infection is not fully excluded in the appropriate clinical context. Electronically Signed   By: Lovena Le M.D.   On: 09/24/2019 17:33    EKG: Independently reviewed. Atrial fibrillation, rate 102, incomplete RBBB, PVC.  Rate slightly faster when compared to prior, PVC is new.  Assessment/Plan Principal Problem:   Acute respiratory failure with hypoxia (HCC) Active Problems:   Essential hypertension   H/O mechanical aortic valve replacement   Obstructive sleep apnea   Hyperlipidemia LDL goal <70   Permanent atrial fibrillation (HCC)   History of subdural hematoma   Hypothyroidism   Acute on chronic combined systolic and diastolic CHF (congestive heart failure) ([place])  [name] is a 74 y.o. female with medical history significant for HFrEF (EF decreased at 30-35% by TTE 09/17/2019), AS s/p mechanical AVR, permanent A. fib, chronic anticoagulation with Coumadin (goal INR 2-2.5 due to prior history of SDH), hypertension, hyperlipidemia, ascending aortic aneurysm, and OSA on CPAP who is admitted with acute respiratory failure with hypoxia due to acute on chronic combined systolic diastolic CHF.  Acute respiratory failure with hypoxia due to acute on chronic combined systolic and diastolic CHF: Recent TTE on 09/17/2019 showed reduction in EF to 30-35% with global hypokinesis of LV, severely dilated atria, severely elevated pulmonary artery systolic pressure and TR regurgitation.  Patient given IV Lasix 40 mg once in the ED.  BNP 696.5.  Troponin minimally elevated at 30, likely demand.  CXR with pulmonary edema. -Continue with IV Lasix 60 mg twice daily -Monitor strict I/O's and daily weights -Continue losartan -Not currently on beta-blocker, will not start now until more compensated -Advised on low-sodium diet with fluid restriction  Permanent atrial fibrillation AS s/p AVR with Saint Jude mechanical  prosthesis History of subdural hematoma with current goal INR 2.0-2.5: Remains in atrial fibrillation with controlled rate. -Holding diltiazem with soft blood pressure, may benefit from switch to beta-blocker if tolerated given reduced EF (allergy list side effect of depression from metoprolol) -Continue Coumadin per pharmacy  Hypokalemia: Replete orally and follow.  Check  magnesium level.  Hypertension: BP soft on admission.  Resume losartan tomorrow as tolerated with ongoing diuresis.  Holding diltiazem as above.  Hypothyroidism: Continue Synthroid, check TSH.  Hyperlipidemia: Continue pravastatin.  OSA: Continue CPAP nightly.  DVT prophylaxis: Coumadin Code Status: Full code, confirmed with patient  Family Communication: Discussed with patient, she has discussed with family Disposition Plan: From home and likely discharge home pending adequate diuresis Consults called: None Admission status:  Status is: Observation  The patient remains OBS appropriate and will d/c before 2 midnights.  Dispo: The patient is from: Home              Anticipated d/c is to: Home              Anticipated d/c date is: 1 day pending symptomatic improvement, adequate diuresis, and ability to wean off oxygen.              Patient currently is not medically stable to d/c.    Zada Finders MD [facility]  If 7PM-7AM, please contact night-coverage [email or web address]  09/24/2019, 8:49 PM

## 2019-09-24 NOTE — ED Provider Notes (Signed)
[facility] EMERGENCY DEPARTMENT Provider Note   CSN: [id number] Arrival date & time: 09/24/19  1555     History Chief Complaint  Patient presents with  . Shortness of Breath    [name] is a 74 y.o. female.  The history is provided by the patient and medical records. No language interpreter was used.  Shortness of Breath Severity:  Severe Onset quality:  Gradual Duration:  4 days Timing:  Constant Progression:  Worsening Chronicity:  Recurrent Relieved by:  Nothing Worsened by:  Nothing Ineffective treatments:  None tried Associated symptoms: cough and sputum production   Associated symptoms: no abdominal pain, no chest pain, no diaphoresis, no fever, no headaches, no hemoptysis, no vomiting and no wheezing        Past Medical History:  Diagnosis Date  . Anxiety   . Aortic stenosis    status post aortic valve replacement with St. Jude mechanical prosthesis  . 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   60mm by echo 06/2016 and 12mm by CT angin 2016  . Chronic anticoagulation   . 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 Depression   . DJD (degenerative joint disease) of knee   . Dyslipidemia   . GERD (gastroesophageal reflux disease)    "several years ago; none since" (11/12/2012)  . Hyperlipidemia   . Hyperlipidemia LDL goal <70 01/19/2017  . Hypertension   . Left atrial enlargement   . Mitral regurgitation 06/25/2016   Mild moderate MR by echo 06/2016  . Obesity   . Persistent atrial fibrillation (HCC)    s/p afib ablation x 2 at [facility]  . Sleep apnea    On CPAP at 12cm H2O  . 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 Permanent atrial fibrillation ([place]) 06/18/2019  . Severe tricuspid regurgitation 12/18/2018  . Cholelithiasis without cholecystitis  07/31/2017  . Family history of colon cancer 07/31/2017  . Slow transit constipation 07/31/2017  . Hematoma 07/11/2017  . Rectus sheath hematoma, initial encounter   . Hyperlipidemia LDL goal <70 01/19/2017  . Morbid obesity due to excess calories ([place]) complicated by low ERV on pfts/ hbp/hyperlipidemia 09/02/2016  . Mitral regurgitation 06/25/2016  . Ascending aorta dilatation ([place]) 06/25/2016  . Dyspnea on exertion 04/04/2016  . Sinusitis, chronic 04/04/2016  . Cough variant asthma vs UACS 04/03/2016  . Primary osteoarthritis of left knee   . DJD (degenerative joint disease) of knee 11/05/2014  . Encounter for therapeutic drug monitoring 05/07/2013  . Obstructive sleep apnea 01/20/2013  . H/O mechanical aortic valve replacement 01/15/2013  . Aortic valve disorder 01/15/2013  . Chronic diastolic CHF (congestive heart failure) ([place]) 11/14/2012  . Paroxysmal atrial fibrillation ([place]) 07/17/2012  . Essential hypertension 07/17/2012    Past Surgical History:  Procedure Laterality Date  . BURR HOLE FOR SUBDURAL HEMATOMA  2006  . CARDIAC VALVE REPLACEMENT  2006   St. Jude AVR  . CARDIOVERSION N/A 07/22/2012   Procedure: CARDIOVERSION;  Surgeon: [name], MD;  Location: [facility] ENDOSCOPY;  Service: Cardiovascular;  Laterality: N/A;  . CARDIOVERSION N/A 11/25/2012   Procedure: CARDIOVERSION;  Surgeon: [name], MD;  Location: [facility] ENDOSCOPY;  Service: Cardiovascular;  Laterality: N/A;  . CARDIOVERSION N/A 12/30/2012   Procedure: CARDIOVERSION;  Surgeon: [name], MD;  Location: [facility] ENDOSCOPY;  Service: Cardiovascular;  Laterality: N/A;  h&p in file-HW   . CARDIOVERSION N/A 03/30/2013   Procedure: CARDIOVERSION;  Surgeon: [name], MD;  Location: [facility] ENDOSCOPY;  Service: Cardiovascular;  Laterality: N/A;  . ELECTROPHYSIOLOGIC STUDY  11/2013   Afib ablation x 2 (11/2013 and 10/2015) at [facility] by Dr Clyda Hurdle with recurrence post ablation  . KNEE ARTHROSCOPY Left 1980's   "2"  (11/12/2012)  . KNEE SURGERY Left 1980's   "after 2 scopes they went in and did some kind of OR" (11/12/2012)  . TEE WITHOUT CARDIOVERSION N/A 07/22/2012   Procedure: TRANSESOPHAGEAL ECHOCARDIOGRAM (TEE);  Surgeon: [name], MD;  Location: [facility] ENDOSCOPY;  Service: Cardiovascular;  Laterality: N/A;  Rm 2034  . TEE WITHOUT CARDIOVERSION N/A 10/07/2013   Procedure: TRANSESOPHAGEAL ECHOCARDIOGRAM (TEE);  Surgeon: [name], MD;  Location: [facility] ENDOSCOPY;  Service: Cardiovascular;  Laterality: N/A;  . TOTAL KNEE ARTHROPLASTY Left 11/05/2014   Procedure: TOTAL KNEE ARTHROPLASTY;  Surgeon: Dorna Leitz, MD;  Location: [facility];  Service: Orthopedics;  Laterality: Left;  . TUBAL LIGATION  1980's     OB History   No obstetric history on file.     Family History  Problem Relation Age of Onset  . Lung cancer Mother   . Hypertension Mother   . Hyperlipidemia Father   . Hypertension Father   . Heart disease Brother     Social History   Tobacco Use  . Smoking status: Never Smoker  . Smokeless tobacco: Never Used  Substance Use Topics  . Alcohol use: No    Comment: 11/12/2012 "no alcohol in the last 9 years"  . Drug use: No    Home Medications Prior to Admission medications   Medication Sig Start Date End Date Taking? Authorizing Provider  ALPRAZolam Duanne Moron) 0.5 MG tablet Take 0.5 mg by mouth 2 (two) times daily as needed. AS NEEDED FOR ANXIETY 01/11/17   [provider]  calcium-vitamin D (OSCAL WITH D) 500-200 MG-UNIT per tablet Take 2 tablets by mouth daily.     [provider]  Cholecalciferol (VITAMIN D) 2000 UNITS tablet Take 4,000 Units by mouth daily.    [provider]  diltiazem (DILT-XR) 240 MG 24 hr capsule TAKE 1 CAPSULE BY MOUTH EVERY MORNING ON AN EMPTY STOMACH 09/02/19   Buford Dresser, MD  ferrous sulfate 325 (65 FE) MG tablet Take 325 mg by mouth daily with breakfast.    [provider]  furosemide (LASIX) 80 MG tablet Take 80 mg by mouth  daily. Additional 80 mg as needed for swelling    [provider]  levothyroxine (SYNTHROID, LEVOTHROID) 100 MCG tablet Take 100 mcg daily before breakfast by mouth.    [provider]  losartan (COZAAR) 100 MG tablet Take 1 tablet (100 mg total) by mouth daily. 12/17/18   Buford Dresser, MD  Multiple Vitamin (MULTIVITAMIN WITH MINERALS) TABS Take 1 tablet by mouth daily.    [provider]  potassium chloride SA (KLOR-CON) 20 MEQ tablet TAKE 2 TABLETS BY MOUTH EVERY MORNING AND 1 TABLET EVERY EVENING 01/13/19   Buford Dresser, MD  pravastatin (PRAVACHOL) 20 MG tablet TAKE 1 TABLET BY MOUTH EVERY DAY 09/16/18   Buford Dresser, MD  sertraline (ZOLOFT) 50 MG tablet Take 100 mg by mouth daily. Pt takes 75 mg daily     [provider]  tretinoin (RETIN-A) 0.05 % cream Apply 1 application topically at bedtime.     [provider]  warfarin (  COUMADIN) 4 MG tablet TAKE 1/2 TO 1 TABLET BY MOUTH DAILY AS DIRECTED BY COUMADIN CLINIC 08/17/19   Buford Dresser, MD  zolpidem (AMBIEN) 10 MG tablet Take 5 mg by mouth at bedtime as needed for sleep.    [provider]    Allergies    Codeine, Beta adrenergic blockers, Metoprolol, Propoxyphene, Toprol xl [metoprolol tartrate], and Dilantin [phenytoin sodium extended]  Review of Systems   Review of Systems  Constitutional: Positive for fatigue. Negative for chills, diaphoresis and fever.  HENT: Negative for congestion.   Respiratory: Positive for cough, sputum production, chest tightness and shortness of breath. Negative for hemoptysis, choking, wheezing and stridor.   Cardiovascular: Positive for leg swelling. Negative for chest pain and palpitations.  Gastrointestinal: Negative for abdominal pain, constipation, diarrhea, nausea and vomiting.  Genitourinary: Negative for flank pain.  Musculoskeletal: Negative for back pain.  Neurological: Negative for light-headedness and  headaches.  Psychiatric/Behavioral: Negative for agitation.  All other systems reviewed and are negative.   Physical Exam Updated Vital Signs Ht 5\' 7"  (1.702 m)   Wt 106.1 kg   BMI 36.65 kg/m   Physical Exam Vitals and nursing note reviewed.  Constitutional:      General: She is not in acute distress.    Appearance: She is well-developed. She is not ill-appearing, toxic-appearing or diaphoretic.  HENT:     Head: Normocephalic and atraumatic.  Eyes:     Conjunctiva/sclera: Conjunctivae normal.     Pupils: Pupils are equal, round, and reactive to light.  Cardiovascular:     Rate and Rhythm: Normal rate and regular rhythm.     Heart sounds: No murmur heard.   Pulmonary:     Effort: Pulmonary effort is normal. No respiratory distress.     Breath sounds: Rales present. No wheezing or rhonchi.  Chest:     Chest wall: No tenderness.  Abdominal:     Palpations: Abdomen is soft.     Tenderness: There is no abdominal tenderness.  Musculoskeletal:     Cervical back: Neck supple.     Right lower leg: No tenderness. Edema present.     Left lower leg: No tenderness. Edema present.  Skin:    General: Skin is warm and dry.     Capillary Refill: Capillary refill takes less than 2 seconds.     Findings: No erythema.  Neurological:     General: No focal deficit present.     Mental Status: She is alert.  Psychiatric:        Mood and Affect: Mood normal.     ED Results / Procedures / Treatments   Labs (all labs ordered are listed, but only abnormal results are displayed) Labs Reviewed  CBC WITH DIFFERENTIAL/PLATELET - Abnormal; Notable for the following components:      Result Value   RDW 17.0 (*)    Monocytes Absolute 1.1 (*)    All other components within normal limits  COMPREHENSIVE METABOLIC PANEL - Abnormal; Notable for the following components:   Potassium 3.4 (*)    Glucose, Bld 111 (*)    Calcium 8.8 (*)    Alkaline Phosphatase 157 (*)    Total Bilirubin 1.4 (*)     All other components within normal limits  BRAIN NATRIURETIC PEPTIDE - Abnormal; Notable for the following components:   B Natriuretic Peptide 696.5 (*)    All other components within normal limits  PROTIME-INR - Abnormal; Notable for the following components:   Prothrombin Time 25.8 (*)  INR 2.5 (*)    All other components within normal limits  URINALYSIS, ROUTINE W REFLEX MICROSCOPIC - Abnormal; Notable for the following components:   Color, Urine STRAW (*)    All other components within normal limits  TROPONIN I (HIGH SENSITIVITY) - Abnormal; Notable for the following components:   Troponin I (High Sensitivity) 26 (*)    All other components within normal limits  TROPONIN I (HIGH SENSITIVITY) - Abnormal; Notable for the following components:   Troponin I (High Sensitivity) 30 (*)    All other components within normal limits  SARS CORONAVIRUS 2 BY RT PCR (HOSPITAL ORDER, [facility])  CULTURE, BLOOD (ROUTINE X 2)  CULTURE, BLOOD (ROUTINE X 2)  URINE CULTURE  LACTIC ACID, PLASMA  LACTIC ACID, PLASMA  LIPASE, BLOOD  MAGNESIUM  BASIC METABOLIC PANEL  MAGNESIUM  CBC  TSH  PROTIME-INR    EKG EKG Interpretation  Date/Time:  Thursday September 24 2019 20:51:38 EDT Ventricular Rate:  84 PR Interval:    QRS Duration: 112 QT Interval:  366 QTC Calculation: 433 R Axis:   69 Text Interpretation: Atrial fibrillation Incomplete right bundle branch block Anterior infarct, age indeterminate When compared to prior, similar afib. No STEMI Confirmed by Antony Blackbird [phone]) on 09/25/2019 12:18:02 AM   Radiology DG Chest Portable 1 View  Result Date: 09/24/2019 CLINICAL DATA:  Fever, cough, shortness of breath and history of CHF EXAM: PORTABLE CHEST 1 VIEW COMPARISON:  Radiograph 08/04/2019 FINDINGS: Lung apices are partially obscured by the patient's chin and neck soft tissues. Lung volumes are low with some atelectatic changes towards the bases. There is marked  central vascular congestion with hazy, indistinct pulmonary vascularity. Perihilar and basilar predominant interstitial opacities are present with peripheral septal lines. In the more focal bandlike opacity in the left mid lung could reflect further septal thickening, fissural thickening or atelectatic change. No pneumothorax or visible effusion. Cardiomegaly similar to comparisons with postsurgical changes from prior sternotomy and likely CABG with aortic valve replacement. Telemetry leads overlie the chest. No acute osseous or soft tissue abnormality. Degenerative changes are present in the imaged spine and shoulders. IMPRESSION: 1. Features most suggestive of CHF/volume overload with cardiomegaly, pulmonary edema. 2. Superimposed infection is not fully excluded in the appropriate clinical context. Electronically Signed   By: Lovena Le M.D.   On: 09/24/2019 17:33    Procedures Procedures (including critical care time)  Medications Ordered in ED Medications  sodium chloride flush (NS) 0.9 % injection 3 mL (has no administration in time range)  sodium chloride flush (NS) 0.9 % injection 3 mL (has no administration in time range)  0.9 %  sodium chloride infusion (has no administration in time range)  acetaminophen (TYLENOL) tablet 650 mg (has no administration in time range)  ondansetron (ZOFRAN) injection 4 mg (has no administration in time range)  furosemide (LASIX) injection 60 mg (has no administration in time range)  potassium chloride 20 MEQ/15ML (10%) solution 40 mEq (40 mEq Oral Given 09/24/19 2310)  levothyroxine (SYNTHROID) tablet 100 mcg (has no administration in time range)  losartan (COZAAR) tablet 100 mg (has no administration in time range)  pravastatin (PRAVACHOL) tablet 20 mg (20 mg Oral Given 09/24/19 2310)  Warfarin - Pharmacist Dosing Inpatient (has no administration in time range)  furosemide (LASIX) injection 40 mg (40 mg Intravenous Given 09/24/19 2041)  warfarin (COUMADIN)  tablet 2 mg (2 mg Oral Given 09/24/19 2310)    ED Course  I have reviewed  the triage vital signs and the nursing notes.  Pertinent labs & imaging results that were available during my care of the patient were reviewed by me and considered in my medical decision making (see chart for details).    MDM Rules/Calculators/A&P                          ANGELEEN HORNEY is a 74 y.o. female with a past medical history significant for chronic atrial fibrillation on Coumadin therapy, mechanical aortic valve replacement, CHF, hypertension, asthma, and hyperlipidemia who presents with worsening shortness of breath.  Patient reports that for the last few days she is had worsening shortness of breath and fatigue.  She reports that she has had some cough with production.  She reports also had some diarrhea but denies any urinary changes.  She reports her legs are all swollen and this is similar to prior.  She reports a temperature of 101 with EMS and was found to be tachycardic and tachypneic.  She was found to be hypoxic with sats in the 80s at home and is now on 3 L nasal cannula which she does not use normally.  She does use oxygen at night with CPAP but does not have oxygen in the daytime.  She reports has had no energy and her shortness of breath is worsening.  She denies current chest pain.  She denies palpitations.  She denies medication changes.  She denies other complaints.  She has been vaccinated for COVID-19.  On exam, patient has both rales and rhonchi in the bases of her lungs bilaterally.  Mechanical valve murmur was appreciated.  No chest tenderness or abdominal tenderness.  Some edema in her legs that is mild with compression stockings in place.  Good pulses in extremities.  Patient now on oxygen but resting more comfortably.  EKG shows persistent Afib with no STEMI.  As patient is on oxygen now and does not take oxygen normally at home, dissipate she will require admission.  Clinically I  suspect pneumonia versus CHF exacerbation.  I did not appreciate wheezing.  Anticipate reassessment after work-up before disposition.  Work-up shows evidence of CHF on chest x-ray, elevated BNP, and clinically she has rales in her lungs.  She is now on 3 L nasal cannula and is not on oxygen at home normally.  She will need admission for diuresis.  IV Lasix was ordered and she will be admitted for further management.    Final Clinical Impression(s) / ED Diagnoses Final diagnoses:  Hypoxia  SOB (shortness of breath)  Acute on chronic congestive heart failure, unspecified heart failure type (HCC)    Clinical Impression: 1. Hypoxia   2. SOB (shortness of breath)   3. Acute on chronic congestive heart failure, unspecified heart failure type ([place])     Disposition: Admit  This note was prepared with assistance of Dragon voice recognition software. Occasional wrong-word or sound-a-like substitutions may have occurred due to the inherent limitations of voice recognition software.     Lalaine Overstreet, Gwenyth Allegra, MD 09/25/19 [phone]

## 2019-09-25 ENCOUNTER — Encounter (HOSPITAL_COMMUNITY): Payer: Self-pay | Admitting: Internal Medicine

## 2019-09-25 DIAGNOSIS — G4733 Obstructive sleep apnea (adult) (pediatric): Secondary | ICD-10-CM

## 2019-09-25 DIAGNOSIS — I081 Rheumatic disorders of both mitral and tricuspid valves: Secondary | ICD-10-CM | POA: Diagnosis present

## 2019-09-25 DIAGNOSIS — I110 Hypertensive heart disease with heart failure: Secondary | ICD-10-CM | POA: Diagnosis present

## 2019-09-25 DIAGNOSIS — J9601 Acute respiratory failure with hypoxia: Secondary | ICD-10-CM | POA: Diagnosis present

## 2019-09-25 DIAGNOSIS — F329 Major depressive disorder, single episode, unspecified: Secondary | ICD-10-CM | POA: Diagnosis present

## 2019-09-25 DIAGNOSIS — Z79890 Hormone replacement therapy (postmenopausal): Secondary | ICD-10-CM | POA: Diagnosis not present

## 2019-09-25 DIAGNOSIS — Z952 Presence of prosthetic heart valve: Secondary | ICD-10-CM

## 2019-09-25 DIAGNOSIS — E039 Hypothyroidism, unspecified: Secondary | ICD-10-CM | POA: Diagnosis present

## 2019-09-25 DIAGNOSIS — I4821 Permanent atrial fibrillation: Secondary | ICD-10-CM | POA: Diagnosis present

## 2019-09-25 DIAGNOSIS — I248 Other forms of acute ischemic heart disease: Secondary | ICD-10-CM | POA: Diagnosis present

## 2019-09-25 DIAGNOSIS — K219 Gastro-esophageal reflux disease without esophagitis: Secondary | ICD-10-CM | POA: Diagnosis present

## 2019-09-25 DIAGNOSIS — I428 Other cardiomyopathies: Secondary | ICD-10-CM | POA: Diagnosis present

## 2019-09-25 DIAGNOSIS — Z7901 Long term (current) use of anticoagulants: Secondary | ICD-10-CM | POA: Diagnosis not present

## 2019-09-25 DIAGNOSIS — I5043 Acute on chronic combined systolic (congestive) and diastolic (congestive) heart failure: Secondary | ICD-10-CM | POA: Diagnosis present

## 2019-09-25 DIAGNOSIS — Z885 Allergy status to narcotic agent status: Secondary | ICD-10-CM | POA: Diagnosis not present

## 2019-09-25 DIAGNOSIS — Z79899 Other long term (current) drug therapy: Secondary | ICD-10-CM | POA: Diagnosis not present

## 2019-09-25 DIAGNOSIS — E876 Hypokalemia: Secondary | ICD-10-CM | POA: Diagnosis present

## 2019-09-25 DIAGNOSIS — Z888 Allergy status to other drugs, medicaments and biological substances status: Secondary | ICD-10-CM | POA: Diagnosis not present

## 2019-09-25 DIAGNOSIS — F419 Anxiety disorder, unspecified: Secondary | ICD-10-CM | POA: Diagnosis present

## 2019-09-25 DIAGNOSIS — Z20822 Contact with and (suspected) exposure to covid-19: Secondary | ICD-10-CM | POA: Diagnosis present

## 2019-09-25 DIAGNOSIS — Z801 Family history of malignant neoplasm of trachea, bronchus and lung: Secondary | ICD-10-CM | POA: Diagnosis not present

## 2019-09-25 DIAGNOSIS — I509 Heart failure, unspecified: Secondary | ICD-10-CM

## 2019-09-25 DIAGNOSIS — I77810 Thoracic aortic ectasia: Secondary | ICD-10-CM | POA: Diagnosis present

## 2019-09-25 DIAGNOSIS — E785 Hyperlipidemia, unspecified: Secondary | ICD-10-CM | POA: Diagnosis present

## 2019-09-25 DIAGNOSIS — R0602 Shortness of breath: Secondary | ICD-10-CM | POA: Diagnosis present

## 2019-09-25 LAB — URINE CULTURE: Culture: NO GROWTH

## 2019-09-25 LAB — CBC
HCT: 38 % (ref 36.0–46.0)
Hemoglobin: 12.2 g/dL (ref 12.0–15.0)
MCH: 31.3 pg (ref 26.0–34.0)
MCHC: 32.1 g/dL (ref 30.0–36.0)
MCV: 97.4 fL (ref 80.0–100.0)
Platelets: 171 10*3/uL (ref 150–400)
RBC: 3.9 MIL/uL (ref 3.87–5.11)
RDW: 17 % — ABNORMAL HIGH (ref 11.5–15.5)
WBC: 7.7 10*3/uL (ref 4.0–10.5)
nRBC: 0 % (ref 0.0–0.2)

## 2019-09-25 LAB — PROTIME-INR
INR: 2.4 — ABNORMAL HIGH (ref 0.8–1.2)
Prothrombin Time: 25.1 seconds — ABNORMAL HIGH (ref 11.4–15.2)

## 2019-09-25 LAB — BASIC METABOLIC PANEL
Anion gap: 10 (ref 5–15)
BUN: 12 mg/dL (ref 8–23)
CO2: 29 mmol/L (ref 22–32)
Calcium: 8.4 mg/dL — ABNORMAL LOW (ref 8.9–10.3)
Chloride: 101 mmol/L (ref 98–111)
Creatinine, Ser: 0.75 mg/dL (ref 0.44–1.00)
GFR calc Af Amer: >60 mL/min (ref >60)
GFR calc non Af Amer: >60 mL/min (ref >60)
Glucose, Bld: 105 mg/dL — ABNORMAL HIGH (ref 70–99)
Potassium: 3.1 mmol/L — ABNORMAL LOW (ref 3.5–5.1)
Sodium: 140 mmol/L (ref 135–145)

## 2019-09-25 LAB — MAGNESIUM: Magnesium: 1.8 mg/dL (ref 1.7–2.4)

## 2019-09-25 MED ORDER — WARFARIN SODIUM 2 MG PO TABS
4.0000 mg | ORAL_TABLET | Freq: Once | ORAL | Status: AC
  Administered 2019-09-25: 4 mg via ORAL
  Filled 2019-09-25: qty 1
  Filled 2019-09-25: qty 2

## 2019-09-25 MED ORDER — POTASSIUM CHLORIDE CRYS ER 20 MEQ PO TBCR: 40.0000 meq | EXTENDED_RELEASE_TABLET | Freq: Once | ORAL | Status: DC

## 2019-09-25 MED ORDER — POTASSIUM CHLORIDE CRYS ER 20 MEQ PO TBCR
40.0000 meq | EXTENDED_RELEASE_TABLET | Freq: Once | ORAL | Status: AC
  Administered 2019-09-25: 40 meq via ORAL
  Filled 2019-09-25: qty 2

## 2019-09-25 MED ORDER — MAGNESIUM SULFATE 2 GM/50ML IV SOLN
2.0000 g | Freq: Once | INTRAVENOUS | Status: AC
  Administered 2019-09-25: 2 g via INTRAVENOUS
  Filled 2019-09-25: qty 50

## 2019-09-25 NOTE — Progress Notes (Signed)
ANTICOAGULATION CONSULT NOTE - Initial Consult  Pharmacy Consult for Warfarin Indication: Afib and MAVR  Allergies  Allergen Reactions  . Codeine Anaphylaxis    Jerking, involuntary jerking   . Beta Adrenergic Blockers     Makes her crazy  . Metoprolol     depression   . Propoxyphene Other (See Comments)    Bad vivid dreams  . Toprol Xl [Metoprolol Tartrate]     depression  . Dilantin [Phenytoin Sodium Extended] Rash and Itching    Patient Measurements: Height: 5\' 7"  (170.2 cm) Weight: 106.1 kg (234 lb) IBW/kg (Calculated) : 61.6 Heparin Dosing Weight:   Vital Signs: BP: 101/61 (06/18 0847) Pulse Rate: 74 (06/18 0847)  Labs: Recent Labs    09/24/19 1742 09/24/19 1856 09/25/19 0545  HGB 12.9  --  12.2  HCT 39.7  --  38.0  PLT 172  --  171  LABPROT 25.8*  --  25.1*  INR 2.5*  --  2.4*  CREATININE 0.89  --  0.75  TROPONINIHS 26* 30*  --     Estimated Creatinine Clearance: 77.3 mL/min (by C-G formula based on SCr of 0.75 mg/dL).   Medical History: Past Medical History:  Diagnosis Date  . Anxiety   . Aortic stenosis    status post aortic valve replacement with St. Jude mechanical prosthesis  . 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   97mm by echo 06/2016 and 71mm by CT angin 2016  . Chronic anticoagulation   . 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 Depression   . DJD (degenerative joint disease) of knee   . Dyslipidemia   . GERD (gastroesophageal reflux disease)    "several years ago; none since" (11/12/2012)  . Hyperlipidemia   . Hyperlipidemia LDL goal <70 01/19/2017  . Hypertension   . Left atrial enlargement   . Mitral regurgitation 06/25/2016   Mild moderate MR by echo 06/2016  . Obesity   . Persistent atrial fibrillation (HCC)    s/p afib ablation x 2 at [facility]  . Sleep apnea    On CPAP at 12cm H2O  . 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 - now cleared by  neurosurgery to maintain INR 2-2.5    Medications:  Scheduled:  . furosemide  60 mg Intravenous Q12H  . levothyroxine  100 mcg Oral Q0600  . losartan  100 mg Oral Daily  . potassium chloride  40 mEq Oral Daily  . pravastatin  20 mg Oral Daily  . sodium chloride flush  3 mL Intravenous Q12H  . Warfarin - Pharmacist Dosing Inpatient   Does not apply q1600    Assessment: Patient is a 74 yo female that presented to the ED with SOB. Tthe patient is on warfarin PTA for Afib and MAVR. Patient had a subdural hematoma prior and INR goal was reduced to 2.0-2.5. Pharmacy has been consulted to dose warfarin.   Prior to admission regimen: warfarin 2 mg Wed & Sun and 4 mg all other days  Today, INR 2.4 is therapeutic and within goal of 2.0 to 2.5. Hgb 12.2. Plt 171. No reported bleeding    Goal of Therapy:  2-2.5 Monitor platelets by anticoagulation protocol: Yes   Plan:  Warfarin 4mg  x1 tonight  Monitor INR, CBC and S/S of bleeding daily  Cristela Felt, PharmD PGY1 Pharmacy Resident [employer]: [phone]  09/25/2019,9:31 AM

## 2019-09-25 NOTE — Plan of Care (Signed)
°  Problem: Education: Goal: Knowledge of General Education information will improve Description: Including pain rating scale, medication(s)/side effects and non-pharmacologic comfort measures Outcome: Progressing   Problem: Health Behavior/Discharge Planning: Goal: Ability to manage health-related needs will improve Outcome: Progressing   Problem: Clinical Measurements: Goal: Ability to maintain clinical measurements within normal limits will improve Outcome: Progressing Goal: Will remain free from infection Outcome: Progressing Goal: Diagnostic test results will improve Outcome: Progressing Goal: Respiratory complications will improve Outcome: Progressing Goal: Cardiovascular complication will be avoided Outcome: Progressing   Problem: Activity: Goal: Risk for activity intolerance will decrease Outcome: Progressing   Problem: Nutrition: Goal: Adequate nutrition will be maintained Outcome: Progressing   Problem: Coping: Goal: Level of anxiety will decrease Outcome: Progressing   Problem: Elimination: Goal: Will not experience complications related to bowel motility Outcome: Progressing Goal: Will not experience complications related to urinary retention Outcome: Progressing   Problem: Pain Managment: Goal: General experience of comfort will improve Outcome: Progressing   Problem: Safety: Goal: Ability to remain free from injury will improve Outcome: Progressing   Problem: Skin Integrity: Goal: Risk for impaired skin integrity will decrease Outcome: Progressing   Problem: Education: Goal: Ability to demonstrate management of disease process will improve Outcome: Progressing Goal: Ability to verbalize understanding of medication therapies will improve Outcome: Progressing Goal: Individualized Educational Video(s) Outcome: Progressing   Problem: Education: Goal: Ability to verbalize understanding of medication therapies will improve Outcome: Progressing    Problem: Activity: Goal: Capacity to carry out activities will improve Outcome: Progressing

## 2019-09-25 NOTE — ED Notes (Signed)
Tele  Breakfast Ordered 

## 2019-09-25 NOTE — ED Notes (Signed)
Lunch Tray Ordered @ 1049. °

## 2019-09-25 NOTE — Consult Note (Signed)
Cardiology Consultation:   Patient ID: [name] MRN: [id number]; DOB: 1945/08/04  Admit date: 09/24/2019 Date of Consult: 09/25/2019  Primary Care Provider: Maurice Small, MD [facility] Cardiologist: Meredith Dresser, MD  [place] Electrophysiologist:  None    Patient Profile:   [name] is a 74 y.o. 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 Pietro [name].  History of Present Illness:   [name] with above hx and History of atrial fibrillation: (hx of subdural hematoma so INR goal was decreased to 2.0-2.5)  trialed on tikosyn with DCCV, did not maintain NSR. Amiodarone loaded, then DCCV, but did not maintain NSR, Had afib ablation at [facility], did not maintain, and then had repeat ablation 10/2015 at [facility], placed on amiodarone. Was in paroxysmal atrial fibrillation until around 2020. Monitor 10/2018 suggestive of 100% afib burden, though heart rate was largely regular. ECG in office 11/04/18 done and reviewed, thought to be sinus with long first degree block given the regularity. However, ECG done 06/2019 irregular and without clear P waves, suggestive of atrial fibrillation now being permanent.  Recent nuc study 08/25/19 without ischemia and low risk study.    Echo was done 09/17/19 and EF had decreased from 50-55% in 11/2018 to 30-35%, with global hypokinesis with mild dilatation. Also RV is moderately reduced RV is moderately enlarged. And severely elevated PA systolic pressure.  LA and RA severely dilated.  Severe MR and severe TR, moderate to severe dilatation of ascending aortic root at 49 mm.  No AS and Aortic  valve mean gradient measures 7.6 mmHg. Aortic valve peak gradient measures  13.9 mmHg. Aortic valve area, by VTI  measures 1.54 cm. There is a a valve present in the aortic position.  Last seen  by [name] 09/14/19  See above.  Presented to ER yesterday PM with SOB, increasing over 3 days, still using CPAP at night. Here sp02 88-89% on RA, placed on 2 L [place],   EKG:  The EKG was personally reviewed and demonstrates:  A fib with rate 102 T wave inversion V4-6 Telemetry:  Telemetry was personally reviewed and demonstrates:  None to be placed.  Today Na 140, K+ 3.1 BUN 12, Cr 0.75 Ca- 8.4  BNP 696  Hs troponin 26 and 30  Hgb 12.2 Hct plts 171 WBC 7.7  INR 2.4 COVID neg  Has rec'd lasix 40 mg and now on 60 BID and is neg 1200  Wt 102.5 Kg in office 09/14/19 with clothes was 107.4   VS BP 114/78 P 79  Feeling better and edema in legs has improved.Sister in room with Pt    Past Medical History:  Diagnosis Date   Anxiety    Aortic stenosis    status post aortic valve replacement with St. 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   Depression    DJD (degenerative joint disease) of knee    Dyslipidemia    GERD (gastroesophageal reflux disease)    "several years ago; none since" (11/12/2012)   Hyperlipidemia    Hyperlipidemia LDL goal <70 01/19/2017   Hypertension    Left atrial  enlargement    Mitral regurgitation 06/25/2016   Mild moderate MR by echo 06/2016   Obesity    Persistent atrial fibrillation (HCC)    s/p afib ablation x 2 at [facility]   Sleep apnea    On CPAP at 12cm H2O   Subdural hematoma ([place])    in setting of INR greater than 2.2 shortly after AVR - now cleared by neurosurgery to maintain INR 2-2.5    Past Surgical History:  Procedure Laterality Date   BURR HOLE FOR SUBDURAL HEMATOMA  2006   CARDIAC VALVE REPLACEMENT  2006   St. Jude AVR   CARDIOVERSION N/A 07/22/2012   Procedure: CARDIOVERSION;  Surgeon: Meredith Furbish, MD;  Location: [place];  Service:  Cardiovascular;  Laterality: N/A;   CARDIOVERSION N/A 11/25/2012   Procedure: CARDIOVERSION;  Surgeon: Meredith Margarita, MD;  Location: [place];  Service: Cardiovascular;  Laterality: N/A;   CARDIOVERSION N/A 12/30/2012   Procedure: CARDIOVERSION;  Surgeon: Meredith Margarita, MD;  Location: [place];  Service: Cardiovascular;  Laterality: N/A;  h&p in file-HW    CARDIOVERSION N/A 03/30/2013   Procedure: CARDIOVERSION;  Surgeon: Meredith Margarita, MD;  Location: [place];  Service: Cardiovascular;  Laterality: N/A;   ELECTROPHYSIOLOGIC STUDY  11/2013   Afib ablation x 2 (11/2013 and 10/2015) at [facility] by Dr [name] with recurrence post ablation   KNEE ARTHROSCOPY Left 1980's   "2" (11/12/2012)   KNEE SURGERY Left 1980's   "after 2 scopes they went in and did some kind of OR" (11/12/2012)   TEE WITHOUT CARDIOVERSION N/A 07/22/2012   Procedure: TRANSESOPHAGEAL ECHOCARDIOGRAM (TEE);  Surgeon: Meredith Furbish, MD;  Location: [facility] ENDOSCOPY;  Service: Cardiovascular;  Laterality: N/A;  Rm 2034   TEE WITHOUT CARDIOVERSION N/A 10/07/2013   Procedure: TRANSESOPHAGEAL ECHOCARDIOGRAM (TEE);  Surgeon: Meredith Furbish, MD;  Location: [facility] ENDOSCOPY;  Service: Cardiovascular;  Laterality: N/A;   TOTAL KNEE ARTHROPLASTY Left 11/05/2014   Procedure: TOTAL KNEE ARTHROPLASTY;  Surgeon: Meredith Leitz, MD;  Location: [place];  Service: Orthopedics;  Laterality: Left;   TUBAL LIGATION  1980's     Home Medications:  Prior to Admission medications   Medication Sig Start Date End Date Taking? Authorizing Provider  acetaminophen (TYLENOL) 500 MG tablet Take 1,000 mg by mouth daily as needed for mild pain.   Yes [provider]  ALPRAZolam Duanne Moron) 0.5 MG tablet Take 0.5 mg by mouth 2 (two) times daily as needed for anxiety.  01/11/17  Yes [provider]  calcium-vitamin D (OSCAL WITH D) 500-200 MG-UNIT per tablet Take 2 tablets by mouth daily.    Yes [provider]  Cholecalciferol (VITAMIN D) 2000  UNITS tablet Take 4,000 Units by mouth daily.   Yes [provider]  diltiazem (DILT-XR) 240 MG 24 hr capsule TAKE 1 CAPSULE BY MOUTH EVERY MORNING ON AN EMPTY STOMACH Patient taking differently: Take 240 mg by mouth daily. Take on an empty stomach. 09/02/19  Yes Meredith Dresser, MD  ferrous sulfate 325 (65 FE) MG tablet Take 325 mg by mouth daily with breakfast.   Yes [provider]  furosemide (LASIX) 80 MG tablet Take 80 mg by mouth daily. Additional 80 mg as needed for swelling   Yes [provider]  levothyroxine (SYNTHROID, LEVOTHROID) 100 MCG tablet Take 100 mcg daily before breakfast by mouth.   Yes [provider]  losartan (COZAAR) 100 MG tablet Take 1 tablet (100 mg total) by mouth daily. 12/17/18  Yes Meredith Dresser, MD  Multiple Vitamin (MULTIVITAMIN WITH MINERALS) TABS Take 1 tablet by mouth daily.   Yes [provider]  mupirocin ointment (BACTROBAN) 2 % Apply 1 application topically as directed. 09/17/19  Yes [provider]  oxymetazoline (AFRIN) 0.05 % nasal spray Place 1 spray into both nostrils daily as needed for congestion.   Yes [provider]  potassium chloride SA (KLOR-CON) 20 MEQ tablet TAKE 2 TABLETS BY MOUTH EVERY MORNING AND 1 TABLET EVERY EVENING Patient taking differently: Take 20-40 mEq by mouth See admin instructions. Take 40mg  in the morning and take 20mg  in the evening. 01/13/19  Yes Meredith Dresser, MD  pravastatin (PRAVACHOL) 20 MG tablet TAKE 1 TABLET BY MOUTH EVERY DAY Patient taking differently: Take 20 mg by mouth daily.  09/16/18  Yes Meredith Dresser, MD  sertraline (ZOLOFT) 100 MG tablet Take 100 mg by mouth daily. 07/09/19  Yes [provider]  tretinoin (RETIN-A) 0.05 % cream Apply 1 application topically at bedtime.    Yes [provider]  warfarin (COUMADIN) 4 MG tablet TAKE 1/2 TO 1 TABLET BY MOUTH DAILY AS DIRECTED BY COUMADIN CLINIC Patient taking  differently: Take 2-4 mg by mouth See admin instructions. Take 2.5mg  on Sunday and Wednesday and take 4mg  on all other days. 08/17/19  Yes Meredith Dresser, MD  zolpidem (AMBIEN) 10 MG tablet Take 5 mg by mouth at bedtime as needed for sleep.   Yes [provider]    Inpatient Medications: Scheduled Meds:  furosemide  60 mg Intravenous Q12H   levothyroxine  100 mcg Oral Q0600   losartan  100 mg Oral Daily   potassium chloride  40 mEq Oral Daily   pravastatin  20 mg Oral Daily   sodium chloride flush  3 mL Intravenous Q12H   warfarin  4 mg Oral ONCE-1600   Warfarin - Pharmacist Dosing Inpatient   Does not apply q1600   Continuous Infusions:  sodium chloride     PRN Meds: sodium chloride, acetaminophen, ondansetron (ZOFRAN) IV, sodium chloride flush  Allergies:    Allergies  Allergen Reactions   Codeine Anaphylaxis    Jerking, involuntary jerking    Beta Adrenergic Blockers     Makes her crazy   Metoprolol     depression    Propoxyphene Other (See Comments)    Bad vivid dreams   Toprol Xl [Metoprolol Tartrate]     depression   Dilantin [Phenytoin Sodium Extended] Rash and Itching    Social History:   Social History   Socioeconomic History   Marital status: Married    Spouse name: Not on file   Number of children: Not on file   Years of education: Not on file   Highest education level: Not on file  Occupational History   Not on file  Tobacco Use   Smoking status: Never Smoker   Smokeless tobacco: Never Used  Substance and Sexual Activity   Alcohol use: No    Comment: 11/12/2012 "no alcohol in the last 9 years"   Drug use: No   Sexual activity: Not Currently  Other Topics Concern   Not on file  Social History Narrative   Pt lives in [place] with spouse.  Retired   Scientist, physiological Strain:    Difficulty of Paying Living Expenses:   Food Insecurity:    Worried About Ship broker in the Last Year:    Arboriculturist in the Last Year:   [employer]  Needs:    Lack of Transportation (Medical):    Lack of Transportation (Non-Medical):   Physical Activity:    Days of Exercise per Week:    Minutes of Exercise per Session:   Stress:    Feeling of Stress :   Social Connections:    Frequency of Communication with Friends and Family:    Frequency of Social Gatherings with Friends and Family:    Attends Religious Services:    Active Member of Clubs or Organizations:    Attends Music therapist:    Marital Status:   Intimate Partner Violence:    Fear of Current or Ex-Partner:    Emotionally Abused:    Physically Abused:    Sexually Abused:     Family History:    Family History  Problem Relation Age of Onset   Lung cancer Mother    Hypertension Mother    Hyperlipidemia Father    Hypertension Father    Heart disease Brother      ROS:  Please see the history of present illness.  General:no colds or fevers, no weight changes Skin:no rashes or ulcers HEENT:no blurred vision, no congestion CV:see HPI PUL:see HPI GI:no diarrhea constipation or melena  occ bright blood with stool just on paper, no indigestion GU:no hematuria, no dysuria MS:no joint pain, no claudication Neuro:no syncope, no lightheadedness Endo:no diabetes, no thyroid disease Sleep apnea, wears CPAP with 02 every night so can rest her head back.   All other ROS reviewed and negative.     Physical Exam/Data:   Vitals:   09/25/19 0932 09/25/19 0939 09/25/19 1537 09/25/19 1559  BP: (!) 108/59  122/74 114/78  Pulse: 68  69 79  Resp: 17   20  Temp:  98.2 F (36.8 C)  98 F (36.7 C)  TempSrc:  Oral  Oral  SpO2: 98%  97% 98%  Weight:      Height:        Intake/Output Summary (Last 24 hours) at 09/25/2019 1614 Last data filed at 09/25/2019 0555 Gross per 24 hour  Intake --  Output 1200 ml  Net -1200 ml   Last 3 Weights 09/24/2019 09/21/2019  09/14/2019  Weight (lbs) 234 lb 233 lb 6.4 oz 236 lb 12.8 oz  Weight (kg) 106.142 kg 105.87 kg 107.412 kg     Body mass index is 36.65 kg/m.  General:  Well nourished, well developed, in no acute distress  HEENT: normal Lymph: no adenopathy Neck: mild JVD Endocrine:  No thryomegaly Vascular: No carotid bruits; peal pulses 1+ bilaterally   Cardiac:  normal S1, S2; RRR; no murmur gallup rub or click, but distant heart sounds  Lungs:  clear to auscultation bilaterally, no wheezing, rhonchi or rales  Abd: soft, nontender, no hepatomegaly  Ext: 1+ edema improved Musculoskeletal:  No deformities, BUE and BLE strength normal and equal Skin: warm and dry  Neuro:  Alert and oriented X 3 MAE follows commands, no focal abnormalities noted Psych:  Normal affect    Relevant CV Studies: Nuc study  08/24/19   There was no ST segment deviation noted during stress.  This is a low risk study.   Patient is in atrial fibrillation with irregular RR interval.  Ejection fraction and volumetric assessment unable to be accurately obtained.  No ischemia or infarction noted on perfusion images.  Overall, low risk study.  Echo 09/17/19 IMPRESSIONS    1. Left ventricular ejection fraction, by estimation, is 30 to 35%. The  left ventricle  has moderately decreased function. The left ventricle  demonstrates global hypokinesis. The left ventricular internal cavity size  was mildly dilated. There is moderate  asymmetric left ventricular hypertrophy. Left ventricular diastolic  function could not be evaluated.  2. Right ventricular systolic function is moderately reduced. The right  ventricular size is moderately enlarged. There is severely elevated  pulmonary artery systolic pressure.  3. Left atrial size was severely dilated.  4. Right atrial size was severely dilated.  5. The mitral valve is grossly normal. Severe mitral valve regurgitation.  No evidence of mitral stenosis.  6. Tricuspid  valve regurgitation is severe.  7. The aortic valve has been repaired/replaced. Aortic valve  regurgitation is not visualized. No aortic stenosis is present.  8. Aortic dilatation noted. 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  9. The inferior vena cava is dilated in size with <50% respiratory  variability, suggesting right atrial pressure of 15 mmHg.   FINDINGS  Left Ventricle: Left ventricular ejection fraction, by estimation, is 30  to 35%. The left ventricle has moderately decreased function. The left  ventricle demonstrates global hypokinesis. The left ventricular internal  cavity size was mildly dilated.  There is moderate asymmetric left ventricular hypertrophy. Left  ventricular diastolic function could not be evaluated due to atrial  fibrillation. Left ventricular diastolic function could not be evaluated.   Right Ventricle: The right ventricular size is moderately enlarged. Right  vetricular wall thickness was not assessed. Right ventricular systolic  function is moderately reduced. There is severely elevated pulmonary  artery systolic pressure. The tricuspid  regurgitant velocity is 3.62 m/s, and with an assumed right atrial  pressure of 15 mmHg, the estimated right ventricular systolic pressure is  60.7 mmHg.   Left Atrium: Left atrial size was severely dilated.   Right Atrium: Right atrial size was severely dilated.   Pericardium: There is no evidence of pericardial effusion.   Mitral Valve: The mitral valve is grossly normal. Severe mitral valve  regurgitation. No evidence of mitral valve stenosis. MV peak gradient,  11.7 mmHg. The mean mitral valve gradient is 3.0 mmHg.   Tricuspid Valve: The tricuspid valve is grossly normal. Tricuspid valve  regurgitation is severe. No evidence of tricuspid stenosis.   Aortic Valve: The aortic valve has been repaired/replaced. Aortic valve  regurgitation is not visualized. No aortic  stenosis is present. Aortic  valve mean gradient measures 7.6 mmHg. Aortic valve peak gradient measures  13.9 mmHg. Aortic valve area, by VTI  measures 1.54 cm. There is a a valve present in the aortic position.   Pulmonic Valve: The pulmonic valve was normal in structure. Pulmonic valve  regurgitation is trivial. No evidence of pulmonic stenosis.   Aorta: Aortic dilatation noted. There is moderate to severe dilatation of  the ascending aorta measuring 49 mm.   Venous: The inferior vena cava is dilated in size with less than 50%  respiratory variability, suggesting right atrial pressure of 15 mmHg.   IAS/Shunts: The atrial septum is grossly normal.   Laboratory Data:  High Sensitivity Troponin:   Recent Labs  Lab 09/24/19 1742 09/24/19 1856  TROPONINIHS 26* 30*     Chemistry Recent Labs  Lab 09/24/19 1742 09/25/19 0545  NA 139 140  K 3.4* 3.1*  CL 102 101  CO2 26 29  GLUCOSE 111* 105*  BUN 12 12  CREATININE 0.89 0.75  CALCIUM 8.8* 8.4*  GFRNONAA >60 >60  GFRAA >60 >60  ANIONGAP  11 10    Recent Labs  Lab 09/24/19 1742  PROT 6.8  ALBUMIN 3.6  AST 24  ALT 20  ALKPHOS 157*  BILITOT 1.4*   Hematology Recent Labs  Lab 09/24/19 1742 09/25/19 0545  WBC 9.4 7.7  RBC 4.14 3.90  HGB 12.9 12.2  HCT 39.7 38.0  MCV 95.9 97.4  MCH 31.2 31.3  MCHC 32.5 32.1  RDW 17.0* 17.0*  PLT 172 171   BNP Recent Labs  Lab 09/24/19 1742  BNP 696.5*    DDimer No results for input(s): DDIMER in the last 168 hours.   Radiology/Studies:  DG Chest Portable 1 View  Result Date: 09/24/2019 CLINICAL DATA:  Fever, cough, shortness of breath and history of CHF EXAM: PORTABLE CHEST 1 VIEW COMPARISON:  Radiograph 08/04/2019 FINDINGS: Lung apices are partially obscured by the patient's chin and neck soft tissues. Lung volumes are low with some atelectatic changes towards the bases. There is marked central vascular congestion with hazy, indistinct pulmonary vascularity. Perihilar  and basilar predominant interstitial opacities are present with peripheral septal lines. In the more focal bandlike opacity in the left mid lung could reflect further septal thickening, fissural thickening or atelectatic change. No pneumothorax or visible effusion. Cardiomegaly similar to comparisons with postsurgical changes from prior sternotomy and likely CABG with aortic valve replacement. Telemetry leads overlie the chest. No acute osseous or soft tissue abnormality. Degenerative changes are present in the imaged spine and shoulders. IMPRESSION: 1. Features most suggestive of CHF/volume overload with cardiomegaly, pulmonary edema. 2. Superimposed infection is not fully excluded in the appropriate clinical context. Electronically Signed   By: Lovena Le M.D.   On: 09/24/2019 17:33        NO CHEST PAIN    Assessment and Plan:   1. Acute on chronic systolic and diastolic HF with new decrease in EF to 30-35% from 50-55% in 11/2018.  nuc study without ischemia. Cardiac cath 2012 with normal coronary arteries.  Pt is improving with diuretics. Her dilt was stopped with decrease in EF (she had been on lasix 80 at home po)  Continue 100 mg daily  Troponin mild elevation due to HF. 2. Hx of AVR with St Judemechanical valve on coumadin with goal INR 2-2.5 due to hx of subdural bleed.  She is stable and no bleeding. 3. HTN BP controlled on losartan,  dilt stopped 4. Severe MR and TR diuresis  5. Hypothyroid per IM 6. Atrial fib rate controlled with stopping dilt may need other meds for rate control but she does not due well with BB. 7. Hypokalemia per IM 8. OSA on CPAP 9. HLD on statin and continue  10. Cardiomyopathy on ARB and diuretic would add spironolactone.  Has not tolerated BB in past.       For questions or updates, please contact [place] Please consult [email or web address] for contact info under    Signed, Cecilie Kicks, NP  09/25/2019 4:14 PM

## 2019-09-25 NOTE — Progress Notes (Signed)
PROGRESS NOTE  [name]  DOB: 28-Nov-1945  PCP: Maurice Small, MD ZOX:[id number]  DOA: 09/24/2019  LOS: 0 days   Chief Complaint  Patient presents with  . Shortness of Breath   Brief narrative: [name] is a 74 y.o. female with PMH significant for HFrEF (EF decreased at 30-35% by TTE 09/17/2019), AS s/p mechanical AVR, permanent A. fib, chronic anticoagulation with Coumadin (goal INR 2-2.5 due to prior history of SDH), hypertension, hyperlipidemia, ascending aortic aneurysm, and OSA on CPAP. Patient presented to the ED on 6/17 with progressively worsening shortness of breath, orthopnea, dyspnea on exertion, bilateral pedal edema for last several days. She says she has been taking her Lasix daily and reports good urine output.   In the ED, O2 saturation was 88-89% on room air, improved to 96% on 2 L. Blood pressure 128/84, heart rate 104. Labs are notable for sodium 139, potassium 3.4, bicarb 26, BUN 12, creatinine 0.89, WBC 9.4, hemoglobin 12.9, platelets 172,000, lactic acid 1.0, 1.2, high-sensitivity troponin I 26 >> 30, INR 2.5, BNP 696.5, lipase 16. SARS-CoV-2 PCR is negative. Portable chest x-ray showed prior sternotomy changes with marked central vascular congestion/pulmonary edema.  Patient was given IV Lasix 40 mg once. Patient was admitted under hospitalist service for further evaluation and management.  Subjective: Patient was seen and examined this morning.  Pleasant elderly Caucasian female.  Lying on bed.  Seen in the ED. On oxygen by nasal cannula.  Does not use oxygen at home.  Feels better than at presentation.  Assessment/Plan: Acute respiratory failure with hypoxia  Acute on chronic combined systolic and diastolic CHF Essential hypertension -Presented with progressively worsening worsening shortness of breath, fluid retention. -CXR with pulmonary edema. BNP 696.5.  -recent TTE on 09/17/2019 showed reduction in EF to 30-35% with global hypokinesis  of LV, severely dilated atria, severely elevated pulmonary artery systolic pressure and TR regurgitation. -Home meds include Lasix 80 mg daily, Cardizem 240 mg daily, losartan 100 mg daily. -Patient was given IV Lasix 40 mg once in the ED.  -Currently Lasix 60 mg IV twice daily.  Losartan continued.  Not on beta-blocker. -Monitor intake and output, daily weight, blood pressure, creatinine and electrolytes. -Advised on low-sodium diet with fluid restriction  Elevated troponin Hyperlipidemia -high-sensitivity troponin I 26 >> 30 -Likely demand ischemia secondary to CHF.  Continue to monitor. -Continue statin and Coumadin.  Permanent atrial fibrillation AS s/p AVR with [employer] Jude mechanical prosthesis History of subdural hematoma with current goal INR 2.0-2.5: -Remains in atrial fibrillation with controlled rate. -Holding diltiazem with soft blood pressure, may benefit from switch to beta-blocker if tolerated given reduced EF (allergy list side effect of depression from metoprolol) -Continue Coumadin per pharmacy  Hypokalemia: -Potassium low at 3.1 again this morning.  On daily potassium supplement. -Magnesium 1.8.  2 g IV replacement ordered.    Hypothyroidism: Continue Synthroid  OSA: Continue CPAP nightly.  Mobility: Evaluation Code Status:   Code Status: Full Code per chart DVT prophylaxis:  Coumadin Antimicrobials:  None Fluid: None  Diet:  Diet Order            Diet Heart Room service appropriate? Yes; Fluid consistency: Thin  Diet effective now                 Consultants: Cardiology consultation called Family Communication:  None at bedside  Status is: Observation  The patient will require care spanning > 2 midnights and should be moved to inpatient because: IV treatments  appropriate due to intensity of illness or inability to take PO and Inpatient level of care appropriate due to severity of illness  Dispo: The patient is from: Home               Anticipated d/c is to: Home              Anticipated d/c date is: 2 days              Patient currently is not medically stable to d/c.        Infusions:  . sodium chloride      Scheduled Meds: . furosemide  60 mg Intravenous Q12H  . levothyroxine  100 mcg Oral Q0600  . losartan  100 mg Oral Daily  . potassium chloride  40 mEq Oral Daily  . potassium chloride  40 mEq Oral Once  . pravastatin  20 mg Oral Daily  . sodium chloride flush  3 mL Intravenous Q12H  . warfarin  4 mg Oral ONCE-1600  . Warfarin - Pharmacist Dosing Inpatient   Does not apply q1600    Antimicrobials: Anti-infectives (From admission, onward)   None      PRN meds: sodium chloride, acetaminophen, ondansetron (ZOFRAN) IV, sodium chloride flush   Objective: Vitals:   09/25/19 0932 09/25/19 0939  BP: (!) 108/59   Pulse: 68   Resp: 17   Temp:  98.2 F (36.8 C)  SpO2: 98%     Intake/Output Summary (Last 24 hours) at 09/25/2019 1302 Last data filed at 09/25/2019 0555 Gross per 24 hour  Intake --  Output 1200 ml  Net -1200 ml   Filed Weights   09/24/19 1607  Weight: 106.1 kg   Weight change:  Body mass index is 36.65 kg/m.   Physical Exam: General exam: Appears calm and comfortable.  Not in physical distress Skin: No rashes, lesions or ulcers. HEENT: Atraumatic, normocephalic, supple neck, no obvious bleeding Lungs: Clear to auscultation bilaterally CVS: Regular rate and rhythm, metallic heart sound GI/Abd soft, nontender, nondistended, bowel sound present CNS: Alert, awake, oriented x3 Psychiatry: Mood appropriate Extremities: 1+ bilateral pedal edema, no calf tenderness  Data Review: I have personally reviewed the laboratory data and studies available.  Recent Labs  Lab 09/24/19 1742 09/25/19 0545  WBC 9.4 7.7  NEUTROABS 7.5  --   HGB 12.9 12.2  HCT 39.7 38.0  MCV 95.9 97.4  PLT 172 171   Recent Labs  Lab 09/24/19 1742 09/25/19 0545  NA 139 140  K 3.4* 3.1*  CL 102  101  CO2 26 29  GLUCOSE 111* 105*  BUN 12 12  CREATININE 0.89 0.75  CALCIUM 8.8* 8.4*  MG  --  1.8    Signed, Terrilee Croak, MD [facility] Pager: [phone] (Secure Chat preferred). 09/25/2019

## 2019-09-26 LAB — BASIC METABOLIC PANEL
Anion gap: 9 (ref 5–15)
BUN: 13 mg/dL (ref 8–23)
CO2: 33 mmol/L — ABNORMAL HIGH (ref 22–32)
Calcium: 8.6 mg/dL — ABNORMAL LOW (ref 8.9–10.3)
Chloride: 100 mmol/L (ref 98–111)
Creatinine, Ser: 0.79 mg/dL (ref 0.44–1.00)
GFR calc Af Amer: >60 mL/min (ref >60)
GFR calc non Af Amer: >60 mL/min (ref >60)
Glucose, Bld: 103 mg/dL — ABNORMAL HIGH (ref 70–99)
Potassium: 3.4 mmol/L — ABNORMAL LOW (ref 3.5–5.1)
Sodium: 142 mmol/L (ref 135–145)

## 2019-09-26 LAB — CBC
HCT: 37.7 % (ref 36.0–46.0)
Hemoglobin: 12 g/dL (ref 12.0–15.0)
MCH: 31.1 pg (ref 26.0–34.0)
MCHC: 31.8 g/dL (ref 30.0–36.0)
MCV: 97.7 fL (ref 80.0–100.0)
Platelets: 165 10*3/uL (ref 150–400)
RBC: 3.86 MIL/uL — ABNORMAL LOW (ref 3.87–5.11)
RDW: 16.9 % — ABNORMAL HIGH (ref 11.5–15.5)
WBC: 6.2 10*3/uL (ref 4.0–10.5)
nRBC: 0 % (ref 0.0–0.2)

## 2019-09-26 LAB — PROTIME-INR
INR: 2.3 — ABNORMAL HIGH (ref 0.8–1.2)
Prothrombin Time: 24.9 seconds — ABNORMAL HIGH (ref 11.4–15.2)

## 2019-09-26 MED ORDER — SERTRALINE HCL 100 MG PO TABS
100.0000 mg | ORAL_TABLET | Freq: Every day | ORAL | Status: DC
  Administered 2019-09-26 – 2019-09-30 (×5): 100 mg via ORAL
  Filled 2019-09-26 (×5): qty 1

## 2019-09-26 MED ORDER — WARFARIN SODIUM 2 MG PO TABS
4.0000 mg | ORAL_TABLET | Freq: Once | ORAL | Status: AC
  Administered 2019-09-26: 4 mg via ORAL
  Filled 2019-09-26: qty 2

## 2019-09-26 MED ORDER — POLYETHYLENE GLYCOL 3350 17 G PO PACK
17.0000 g | PACK | Freq: Every day | ORAL | Status: DC
  Administered 2019-09-27 (×2): 17 g via ORAL
  Filled 2019-09-26 (×4): qty 1

## 2019-09-26 MED ORDER — ZOLPIDEM TARTRATE 5 MG PO TABS
5.0000 mg | ORAL_TABLET | Freq: Once | ORAL | Status: AC
  Administered 2019-09-26: 5 mg via ORAL
  Filled 2019-09-26: qty 1

## 2019-09-26 MED ORDER — SACUBITRIL-VALSARTAN 24-26 MG PO TABS
1.0000 | ORAL_TABLET | Freq: Two times a day (BID) | ORAL | Status: DC
  Administered 2019-09-26 – 2019-09-30 (×9): 1 via ORAL
  Filled 2019-09-26 (×9): qty 1

## 2019-09-26 NOTE — Progress Notes (Signed)
SATURATION QUALIFICATIONS: (This note is used to comply with regulatory documentation for home oxygen)  Patient Saturations on Room Air at Rest = 90%  Patient Saturations on Room Air while Ambulating = 84%  Patient Saturations on 2Liters of oxygen while Ambulating = 96%  Please briefly explain why patient needs home oxygen: SOB with low 02 sats while walking after 120 Ft

## 2019-09-26 NOTE — Progress Notes (Signed)
Progress Note  Patient Name: [name] Date of Encounter: 09/26/2019  [facility] Cardiologist: Buford Dresser, MD   Subjective   Feeling improved with less shortness of breath.  Wishes to get out of the bed to walk around the room.  Inpatient Medications    Scheduled Meds: . furosemide  60 mg Intravenous Q12H  . levothyroxine  100 mcg Oral Q0600  . losartan  100 mg Oral Daily  . potassium chloride  40 mEq Oral Daily  . pravastatin  20 mg Oral Daily  . sodium chloride flush  3 mL Intravenous Q12H  . warfarin  4 mg Oral ONCE-1600  . Warfarin - Pharmacist Dosing Inpatient   Does not apply q1600   Continuous Infusions: . sodium chloride     PRN Meds: sodium chloride, acetaminophen, ondansetron (ZOFRAN) IV, sodium chloride flush   Vital Signs    Vitals:   09/25/19 2022 09/26/19 0039 09/26/19 0216 09/26/19 0407  BP: 110/61 101/68  115/83  Pulse: 83 76  80  Resp: 20 18  20   Temp: 98.2 F (36.8 C) 97.8 F (36.6 C)  98 F (36.7 C)  TempSrc: Oral Oral  Oral  SpO2: 98% 97%  97%  Weight:   101.7 kg   Height:        Intake/Output Summary (Last 24 hours) at 09/26/2019 0956 Last data filed at 09/26/2019 0840 Gross per 24 hour  Intake 843 ml  Output 2150 ml  Net -1307 ml   Last 3 Weights 09/26/2019 09/25/2019 09/24/2019  Weight (lbs) 224 lb 1.6 oz 225 lb 15.5 oz 234 lb  Weight (kg) 101.651 kg 102.5 kg 106.142 kg      Telemetry    Atrial fibrillation- Personally Reviewed  ECG    Atrial fibrillation- Personally Reviewed  Physical Exam   GEN: No acute distress.   Neck:  JVD to the mid neck when laying at 30 degrees Cardiac:  Irregular, no murmurs, rubs, or gallops.  Respiratory: Clear to auscultation bilaterally. GI: Soft, nontender, non-distended  MS: No edema; No deformity. Neuro:  Nonfocal  Psych: Normal affect   Labs    High Sensitivity Troponin:   Recent Labs  Lab 09/24/19 1742 09/24/19 1856  TROPONINIHS 26* 30*       Chemistry Recent Labs  Lab 09/24/19 1742 09/25/19 0545 09/26/19 0519  NA 139 140 142  K 3.4* 3.1* 3.4*  CL 102 101 100  CO2 26 29 33*  GLUCOSE 111* 105* 103*  BUN 12 12 13   CREATININE 0.89 0.75 0.79  CALCIUM 8.8* 8.4* 8.6*  PROT 6.8  --   --   ALBUMIN 3.6  --   --   AST 24  --   --   ALT 20  --   --   ALKPHOS 157*  --   --   BILITOT 1.4*  --   --   GFRNONAA >60 >60 >60  GFRAA >60 >60 >60  ANIONGAP 11 10 9      Hematology Recent Labs  Lab 09/24/19 1742 09/25/19 0545 09/26/19 0519  WBC 9.4 7.7 6.2  RBC 4.14 3.90 3.86*  HGB 12.9 12.2 12.0  HCT 39.7 38.0 37.7  MCV 95.9 97.4 97.7  MCH 31.2 31.3 31.1  MCHC 32.5 32.1 31.8  RDW 17.0* 17.0* 16.9*  PLT 172 171 165    BNP Recent Labs  Lab 09/24/19 1742  BNP 696.5*     DDimer No results for input(s): DDIMER in the last 168 hours.  Radiology    DG Chest Portable 1 View  Result Date: 09/24/2019 CLINICAL DATA:  Fever, cough, shortness of breath and history of CHF EXAM: PORTABLE CHEST 1 VIEW COMPARISON:  Radiograph 08/04/2019 FINDINGS: Lung apices are partially obscured by the patient's chin and neck soft tissues. Lung volumes are low with some atelectatic changes towards the bases. There is marked central vascular congestion with hazy, indistinct pulmonary vascularity. Perihilar and basilar predominant interstitial opacities are present with peripheral septal lines. In the more focal bandlike opacity in the left mid lung could reflect further septal thickening, fissural thickening or atelectatic change. No pneumothorax or visible effusion. Cardiomegaly similar to comparisons with postsurgical changes from prior sternotomy and likely CABG with aortic valve replacement. Telemetry leads overlie the chest. No acute osseous or soft tissue abnormality. Degenerative changes are present in the imaged spine and shoulders. IMPRESSION: 1. Features most suggestive of CHF/volume overload with cardiomegaly, pulmonary edema. 2.  Superimposed infection is not fully excluded in the appropriate clinical context. Electronically Signed   By: Lovena Le M.D.   On: 09/24/2019 17:33    Cardiac Studies   TTE 09/17/19 1. Left ventricular ejection fraction, by estimation, is 30 to 35%. The  left ventricle has moderately decreased function. The left ventricle  demonstrates global hypokinesis. The left ventricular internal cavity size  was mildly dilated. There is moderate  asymmetric left ventricular hypertrophy. Left ventricular diastolic  function could not be evaluated.  2. Right ventricular systolic function is moderately reduced. The right  ventricular size is moderately enlarged. There is severely elevated  pulmonary artery systolic pressure.  3. Left atrial size was severely dilated.  4. Right atrial size was severely dilated.  5. The mitral valve is grossly normal. Severe mitral valve regurgitation.  No evidence of mitral stenosis.  6. Tricuspid valve regurgitation is severe.  7. The aortic valve has been repaired/replaced. Aortic valve  regurgitation is not visualized. No aortic stenosis is present.  8. Aortic dilatation noted. 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  9. The inferior vena cava is dilated in size with <50% respiratory  variability, suggesting right atrial pressure of 15 mmHg.   Patient Profile     74 y.o. female history of newly diagnosed systolic heart failure presented to the hospital with worsening shortness of breath.  Assessment & Plan    1.  Acute on chronic systolic heart failure due to nonischemic cardiomyopathy: Net out 2.5 L.  He certainly has more diuresis to go.  We Jeancarlo Leffler also plan to stop her losartan and start her on Entresto.  2.  History of AVR with Saint Jude mechanical valve: INR goal 2-2-1/2 history of subdural bleed.  Valve is remained stable.  3.  Hypertension: Blood pressure controlled on losartan.  We [name] plan to stop  losartan and start [facility] tomorrow.  4.  Severe mitral and tricuspid regurgitation: May improve with diuresis.  5.  Permanent atrial fibrillation: On Coumadin for a mechanical valve.  CHA2DS2-VASc of 4.  6.  Obstructive sleep apnea: CPAP compliance encouraged.  7.  Hyperlipidemia: continue statin     For questions or updates, please contact [facility] Please consult [email or web address] for contact info under        Signed, Danarius Mcconathy Meredith Leeds, MD  09/26/2019, 9:56 AM

## 2019-09-26 NOTE — Progress Notes (Signed)
PROGRESS NOTE  [name]  DOB: [birth year]  PCP: Maurice Small, MD EYC:[id number]  DOA: 09/24/2019  LOS: 1 day   Chief Complaint  Patient presents with  . Shortness of Breath   Brief narrative: [name] is a 74 y.o. female with PMH significant for HFrEF (EF decreased at 30-35% by TTE 09/17/2019), AS s/p mechanical AVR, permanent A. fib, chronic anticoagulation with Coumadin (goal INR 2-2.5 due to prior history of SDH), hypertension, hyperlipidemia, ascending aortic aneurysm, and OSA on CPAP. Patient presented to the ED on 6/17 with progressively worsening shortness of breath, orthopnea, dyspnea on exertion, bilateral pedal edema for last several days. She says she has been taking her Lasix daily and reports good urine output.   In the ED, O2 saturation was 88-89% on room air, improved to 96% on 2 L. Blood pressure 128/84, heart rate 104. Labs are notable for sodium 139, potassium 3.4, bicarb 26, BUN 12, creatinine 0.89, WBC 9.4, hemoglobin 12.9, platelets 172,000, lactic acid 1.0, 1.2, high-sensitivity troponin I 26 >> 30, INR 2.5, BNP 696.5, lipase 16. SARS-CoV-2 PCR is negative. Portable chest x-ray showed prior sternotomy changes with marked central vascular congestion/pulmonary edema.  Patient was given IV Lasix 40 mg once. Patient was admitted under hospitalist service for further evaluation and management.  Subjective: Patient was seen and examined this morning.  Not in distress.  Feels better than yesterday. Chart reviewed.  No fever.  Blood pressure in low normal range  On 2 L oxygen by nasal cannula. Labs with potassium 3.4, creatinine normal, CBC normal, INR 2.3  Assessment/Plan: Acute respiratory failure with hypoxia  Acute on chronic combined systolic and diastolic CHF Essential hypertension -Presented with progressively worsening worsening shortness of breath, fluid retention. -CXR with pulmonary edema. BNP 696.5.  -recent TTE on 09/17/2019 showed  reduction in EF to 30-35% with global hypokinesis of LV, severely dilated atria, severely elevated pulmonary artery systolic pressure and TR regurgitation. -Home meds include Lasix 80 mg daily, Cardizem 240 mg daily, losartan 100 mg daily. -Currently Lasix 60 mg IV twice daily.  Cardiology consult appreciated.  Losartan switched to [facility] today. -Monitor intake and output, daily weight, blood pressure, creatinine and electrolytes. -Advised on low-sodium diet with fluid restriction  Elevated troponin Hyperlipidemia -high-sensitivity troponin I 26 >> 30 -Likely demand ischemia secondary to CHF. Continue to monitor. -Continue statin and Coumadin.  Permanent atrial fibrillation AS s/p AVR with [employer] Jude mechanical prosthesis History of subdural hematoma with current goal INR 2.0-2.5: -Remains in atrial fibrillation with controlled rate. -Holding diltiazem with soft blood pressure, may benefit from switch to beta-blocker if tolerated given reduced EF (allergy list side effect of depression from metoprolol) -Continue Coumadin per pharmacy  Hypokalemia: -Potassium low at 3.4 this morning. On daily potassium supplement.  Hypothyroidism: Continue Synthroid  OSA: Continue CPAP nightly.  Mobility: Evaluation Code Status:   Code Status: Full Code per chart DVT prophylaxis:  Coumadin Antimicrobials:  None Fluid: None  Diet:  Diet Order            Diet Heart Room service appropriate? Yes; Fluid consistency: Thin  Diet effective now                 Consultants: Cardiology consultation called Family Communication:  None at bedside  Status is: Observation  The patient will require care spanning > 2 midnights and should be moved to inpatient because: IV treatments appropriate due to intensity of illness or inability to take PO and Inpatient level of  care appropriate due to severity of illness  Dispo: The patient is from: Home              Anticipated d/c is to: Home               Anticipated d/c date is: 2 days              Patient currently is not medically stable to d/c.  Infusions:  . sodium chloride      Scheduled Meds: . furosemide  60 mg Intravenous Q12H  . levothyroxine  100 mcg Oral Q0600  . potassium chloride  40 mEq Oral Daily  . pravastatin  20 mg Oral Daily  . sacubitril-valsartan  1 tablet Oral BID  . sodium chloride flush  3 mL Intravenous Q12H  . warfarin  4 mg Oral ONCE-1600  . Warfarin - Pharmacist Dosing Inpatient   Does not apply q1600    Antimicrobials: Anti-infectives (From admission, onward)   None      PRN meds: sodium chloride, acetaminophen, ondansetron (ZOFRAN) IV, sodium chloride flush   Objective: Vitals:   09/26/19 0407 09/26/19 1109  BP: 115/83 110/75  Pulse: 80 86  Resp: 20 18  Temp: 98 F (36.7 C) 97.9 F (36.6 C)  SpO2: 97% 99%    Intake/Output Summary (Last 24 hours) at 09/26/2019 1323 Last data filed at 09/26/2019 1026 Gross per 24 hour  Intake 1083 ml  Output 2450 ml  Net -1367 ml   Filed Weights   09/24/19 1607 09/25/19 1559 09/26/19 0216  Weight: 106.1 kg 102.5 kg 101.7 kg   Weight change: -3.642 kg Body mass index is 35.1 kg/m.   Physical Exam: General exam: Appears calm and comfortable.  Not in physical distress. Skin: No rashes, lesions or ulcers. HEENT: Atraumatic, normocephalic, supple neck, no obvious bleeding Lungs: Clear to auscultation bilaterally.  No crackles or wheezing CVS: Regular rate and rhythm, metallic heart sound GI/Abd soft, nontender, nondistended, bowel sound present CNS: Alert, awake, oriented x3 Psychiatry: Mood appropriate Extremities: 1+ bilateral pedal edema, no calf tenderness  Data Review: I have personally reviewed the laboratory data and studies available.  Recent Labs  Lab 09/24/19 1742 09/25/19 0545 09/26/19 0519  WBC 9.4 7.7 6.2  NEUTROABS 7.5  --   --   HGB 12.9 12.2 12.0  HCT 39.7 38.0 37.7  MCV 95.9 97.4 97.7  PLT 172 171 165   Recent Labs   Lab 09/24/19 1742 09/25/19 0545 09/26/19 0519  NA 139 140 142  K 3.4* 3.1* 3.4*  CL 102 101 100  CO2 26 29 33*  GLUCOSE 111* 105* 103*  BUN 12 12 13   CREATININE 0.89 0.75 0.79  CALCIUM 8.8* 8.4* 8.6*  MG  --  1.8  --     Signed, Terrilee Croak, MD [facility] Pager: [phone] (Secure Chat preferred). 09/26/2019

## 2019-09-26 NOTE — Progress Notes (Signed)
ANTICOAGULATION CONSULT NOTE - Initial Consult  Pharmacy Consult for Warfarin Indication: Afib and MAVR  Allergies  Allergen Reactions  . Codeine Anaphylaxis    Jerking, involuntary jerking   . Beta Adrenergic Blockers     Makes her crazy  . Metoprolol     depression   . Propoxyphene Other (See Comments)    Bad vivid dreams  . Toprol Xl [Metoprolol Tartrate]     depression  . Dilantin [Phenytoin Sodium Extended] Rash and Itching    Patient Measurements: Height: 5\' 7"  (170.2 cm) Weight: 101.7 kg (224 lb 1.6 oz) IBW/kg (Calculated) : 61.6  Vital Signs: Temp: 98 F (36.7 C) (06/19 0407) Temp Source: Oral (06/19 0407) BP: 115/83 (06/19 0407) Pulse Rate: 80 (06/19 0407)  Labs: Recent Labs    09/24/19 1742 09/24/19 1742 09/24/19 1856 09/25/19 0545 09/26/19 0519  HGB 12.9   < >  --  12.2 12.0  HCT 39.7  --   --  38.0 37.7  PLT 172  --   --  171 165  LABPROT 25.8*  --   --  25.1* 24.9*  INR 2.5*  --   --  2.4* 2.3*  CREATININE 0.89  --   --  0.75 0.79  TROPONINIHS 26*  --  30*  --   --    < > = values in this interval not displayed.    Estimated Creatinine Clearance: 75.6 mL/min (by C-G formula based on SCr of 0.79 mg/dL).   Medical History: Past Medical History:  Diagnosis Date  . Anxiety   . Aortic stenosis    status post aortic valve replacement with [employer] Jude mechanical prosthesis  . 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   38mm by echo 06/2016 and 70mm by CT angin 2016  . Chronic anticoagulation   . 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 Depression   . DJD (degenerative joint disease) of knee   . Dyslipidemia   . GERD (gastroesophageal reflux disease)    "several years ago; none since" (11/12/2012)  . Hyperlipidemia   . Hyperlipidemia LDL goal <70 01/19/2017  . Hypertension   . Left atrial enlargement   . Mitral regurgitation 06/25/2016   Mild moderate MR by echo 06/2016  . Obesity   . Persistent atrial  fibrillation (HCC)    s/p afib [facility] x 2 [facility]  . Sleep apnea    On CPAP at 12cm H2O  . 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 - now cleared by neurosurgery to maintain INR 2-2.5    Medications:  Scheduled:  . furosemide  60 mg Intravenous Q12H  . levothyroxine  100 mcg Oral Q0600  . losartan  100 mg Oral Daily  . potassium chloride  40 mEq Oral Daily  . pravastatin  20 mg Oral Daily  . sodium chloride flush  3 mL Intravenous Q12H  . Warfarin - Pharmacist Dosing Inpatient   Does not apply q1600    Assessment: Patient is a 74 yo female that presented to the ED with SOB. The patient is on warfarin PTA for Afib and MAVR. Patient had a subdural hematoma prior and INR goal was reduced to 2.0-2.5. Pharmacy has been consulted to dose warfarin.   Prior to admission regimen: warfarin 2 mg Wed & Sun and 4 mg all other days  INR today remains therapeutic at 2.3. H&H stable at 12.0/37.7, plts wnl at 165. Normal  diet.     Goal of Therapy:  2-2.5 Monitor platelets by anticoagulation protocol: Yes   Plan:  Warfarin 4 mg x1 tonight  Monitor INR, CBC and S/S of bleeding daily   Thank you,   Eddie Candle, PharmD PGY-1 Pharmacy Resident   Please check amion for clinical pharmacist contact number

## 2019-09-27 LAB — BASIC METABOLIC PANEL
Anion gap: 10 (ref 5–15)
BUN: 13 mg/dL (ref 8–23)
CO2: 34 mmol/L — ABNORMAL HIGH (ref 22–32)
Calcium: 8.6 mg/dL — ABNORMAL LOW (ref 8.9–10.3)
Chloride: 97 mmol/L — ABNORMAL LOW (ref 98–111)
Creatinine, Ser: 0.63 mg/dL (ref 0.44–1.00)
GFR calc Af Amer: >60 mL/min (ref >60)
GFR calc non Af Amer: >60 mL/min (ref >60)
Glucose, Bld: 108 mg/dL — ABNORMAL HIGH (ref 70–99)
Potassium: 3 mmol/L — ABNORMAL LOW (ref 3.5–5.1)
Sodium: 141 mmol/L (ref 135–145)

## 2019-09-27 LAB — PROTIME-INR
INR: 2.4 — ABNORMAL HIGH (ref 0.8–1.2)
Prothrombin Time: 25.5 seconds — ABNORMAL HIGH (ref 11.4–15.2)

## 2019-09-27 MED ORDER — POTASSIUM CHLORIDE CRYS ER 20 MEQ PO TBCR
40.0000 meq | EXTENDED_RELEASE_TABLET | ORAL | Status: AC
  Administered 2019-09-27 (×3): 40 meq via ORAL
  Filled 2019-09-27 (×3): qty 2

## 2019-09-27 MED ORDER — WARFARIN SODIUM 2 MG PO TABS
2.0000 mg | ORAL_TABLET | Freq: Once | ORAL | Status: AC
  Administered 2019-09-27: 2 mg via ORAL
  Filled 2019-09-27: qty 1

## 2019-09-27 MED ORDER — ZOLPIDEM TARTRATE 5 MG PO TABS
5.0000 mg | ORAL_TABLET | Freq: Once | ORAL | Status: AC
  Administered 2019-09-27: 5 mg via ORAL
  Filled 2019-09-27: qty 1

## 2019-09-27 NOTE — Progress Notes (Signed)
ANTICOAGULATION CONSULT NOTE - Initial Consult  Pharmacy Consult for Warfarin Indication: Afib and MAVR  Allergies  Allergen Reactions  . Codeine Anaphylaxis    Jerking, involuntary jerking   . Beta Adrenergic Blockers     Makes her crazy  . Metoprolol     depression   . Propoxyphene Other (See Comments)    Bad vivid dreams  . Toprol Xl [Metoprolol Tartrate]     depression  . Dilantin [Phenytoin Sodium Extended] Rash and Itching    Patient Measurements: Height: 5\' 7"  (170.2 cm) Weight: 99.7 kg (219 lb 11.2 oz) IBW/kg (Calculated) : 61.6  Vital Signs: Temp: 98.3 F (36.8 C) (06/20 0721) Temp Source: Oral (06/20 0721) BP: 108/69 (06/20 0721) Pulse Rate: 77 (06/20 0721)  Labs: Recent Labs    09/24/19 1742 09/24/19 1742 09/24/19 1856 09/25/19 0545 09/26/19 0519 09/27/19 0651  HGB 12.9   < >  --  12.2 12.0  --   HCT 39.7  --   --  38.0 37.7  --   PLT 172  --   --  171 165  --   LABPROT 25.8*   < >  --  25.1* 24.9* 25.5*  INR 2.5*   < >  --  2.4* 2.3* 2.4*  CREATININE 0.89   < >  --  0.75 0.79 0.63  TROPONINIHS 26*  --  30*  --   --   --    < > = values in this interval not displayed.    Estimated Creatinine Clearance: 74.8 mL/min (by C-G formula based on SCr of 0.63 mg/dL).   Medical History: Past Medical History:  Diagnosis Date  . Anxiety   . Aortic stenosis    status post aortic valve replacement with St. Jude mechanical prosthesis  . 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   35mm by echo 06/2016 and 85mm by CT angin 2016  . Chronic anticoagulation   . 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 Depression   . DJD (degenerative joint disease) of knee   . Dyslipidemia   . GERD (gastroesophageal reflux disease)    "several years ago; none since" (11/12/2012)  . Hyperlipidemia   . Hyperlipidemia LDL goal <70 01/19/2017  . Hypertension   . Left atrial enlargement   . Mitral regurgitation 06/25/2016   Mild moderate MR  by echo 06/2016  . Obesity   . Persistent atrial fibrillation (HCC)    s/p afib ablation x 2 at [facility]  . Sleep apnea    On CPAP at 12cm H2O  . 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 - now cleared by neurosurgery to maintain INR 2-2.5    Medications:  Scheduled:  . furosemide  60 mg Intravenous Q12H  . levothyroxine  100 mcg Oral Q0600  . polyethylene glycol  17 g Oral Daily  . potassium chloride  40 mEq Oral Daily  . potassium chloride  40 mEq Oral Q4H  . pravastatin  20 mg Oral Daily  . sacubitril-valsartan  1 tablet Oral BID  . sertraline  100 mg Oral Daily  . sodium chloride flush  3 mL Intravenous Q12H  . Warfarin - Pharmacist Dosing Inpatient   Does not apply q1600    Assessment: Patient is a 74 yo female that presented to the ED with SOB. The patient is on warfarin PTA for Afib and MAVR. Patient had a subdural hematoma prior and INR  goal was reduced to 2.0-2.5. Pharmacy has been consulted to dose warfarin.   Prior to admission regimen: warfarin 2 mg Wed & Sun and 4 mg all other days  INR today remains therapeutic at 2.4. no CBC today but has been stable.    Goal of Therapy:  2-2.5 Monitor platelets by anticoagulation protocol: Yes   Plan:  Warfarin 2 mg x1 tonight  Monitor INR, CBC and S/S of bleeding daily   Thank you,   Eddie Candle, PharmD PGY-1 Pharmacy Resident   Please check amion for clinical pharmacist contact number

## 2019-09-27 NOTE — Progress Notes (Signed)
Patient requested for Ambien for insomnia. Stated she took it last night and wants it tonight. Notified Dr. Claria Dice and received a dose of Ambien 5 mg oral. Will administer and continue to monitor.

## 2019-09-27 NOTE — Progress Notes (Signed)
Progress Note  Patient Name: [name] Date of Encounter: 09/27/2019  [facility] Cardiologist: Buford Dresser, MD   Subjective   Continues to improve with less shortness of breath.  Walked 120 feet yesterday without oxygen, but oxygen sats went into the 80s.  Oxygen was put back on.  Inpatient Medications    Scheduled Meds: . furosemide  60 mg Intravenous Q12H  . levothyroxine  100 mcg Oral Q0600  . polyethylene glycol  17 g Oral Daily  . potassium chloride  40 mEq Oral Daily  . potassium chloride  40 mEq Oral Q4H  . pravastatin  20 mg Oral Daily  . sacubitril-valsartan  1 tablet Oral BID  . sertraline  100 mg Oral Daily  . sodium chloride flush  3 mL Intravenous Q12H  . Warfarin - Pharmacist Dosing Inpatient   Does not apply q1600   Continuous Infusions: . sodium chloride     PRN Meds: sodium chloride, acetaminophen, ondansetron (ZOFRAN) IV, sodium chloride flush   Vital Signs    Vitals:   09/26/19 2003 09/27/19 0039 09/27/19 0503 09/27/19 0721  BP: (!) 123/100 103/68 110/72 108/69  Pulse: 99 80 78 77  Resp: 20 15 18 20   Temp: 98.1 F (36.7 C) 98.3 F (36.8 C) 98.2 F (36.8 C)   TempSrc: Oral Oral Oral   SpO2: 96% 96% 97% 97%  Weight:  99.7 kg    Height:        Intake/Output Summary (Last 24 hours) at 09/27/2019 0847 Last data filed at 09/27/2019 0739 Gross per 24 hour  Intake 680 ml  Output 3990 ml  Net -3310 ml   Last 3 Weights 09/27/2019 09/26/2019 09/25/2019  Weight (lbs) 219 lb 11.2 oz 224 lb 1.6 oz 225 lb 15.5 oz  Weight (kg) 99.655 kg 101.651 kg 102.5 kg      Telemetry    Atrial fibrillation- Personally Reviewed  ECG    None new- Personally Reviewed  Physical Exam   GEN: Well nourished, well developed, in no acute distress  HEENT: normal  Neck: JVD to the mid neck at 30 degrees, carotid bruits, or masses Cardiac: Irregular; no murmurs, rubs, or gallops,no edema  Respiratory:  clear to auscultation bilaterally, normal  work of breathing GI: soft, nontender, nondistended, + BS MS: no deformity or atrophy  Skin: warm and dry Neuro:  Strength and sensation are intact Psych: euthymic mood, full affect   Labs    High Sensitivity Troponin:   Recent Labs  Lab 09/24/19 1742 09/24/19 1856  TROPONINIHS 26* 30*      Chemistry Recent Labs  Lab 09/24/19 1742 09/24/19 1742 09/25/19 0545 09/26/19 0519 09/27/19 0651  NA 139   < > 140 142 141  K 3.4*   < > 3.1* 3.4* 3.0*  CL 102   < > 101 100 97*  CO2 26   < > 29 33* 34*  GLUCOSE 111*   < > 105* 103* 108*  BUN 12   < > 12 13 13   CREATININE 0.89   < > 0.75 0.79 0.63  CALCIUM 8.8*   < > 8.4* 8.6* 8.6*  PROT 6.8  --   --   --   --   ALBUMIN 3.6  --   --   --   --   AST 24  --   --   --   --   ALT 20  --   --   --   --   [facility]  157*  --   --   --   --   BILITOT 1.4*  --   --   --   --   GFRNONAA >60   < > >60 >60 >60  GFRAA >60   < > >60 >60 >60  ANIONGAP 11   < > 10 9 10    < > = values in this interval not displayed.     Hematology Recent Labs  Lab 09/24/19 1742 09/25/19 0545 09/26/19 0519  WBC 9.4 7.7 6.2  RBC 4.14 3.90 3.86*  HGB 12.9 12.2 12.0  HCT 39.7 38.0 37.7  MCV 95.9 97.4 97.7  MCH 31.2 31.3 31.1  MCHC 32.5 32.1 31.8  RDW 17.0* 17.0* 16.9*  PLT 172 171 165    BNP Recent Labs  Lab 09/24/19 1742  BNP 696.5*     DDimer No results for input(s): DDIMER in the last 168 hours.   Radiology    No results found.  Cardiac Studies   TTE 09/17/19 1. Left ventricular ejection fraction, by estimation, is 30 to 35%. The  left ventricle has moderately decreased function. The left ventricle  demonstrates global hypokinesis. The left ventricular internal cavity size  was mildly dilated. There is moderate  asymmetric left ventricular hypertrophy. Left ventricular diastolic  function could not be evaluated.  2. Right ventricular systolic function is moderately reduced. The right  ventricular size is moderately enlarged.  There is severely elevated  pulmonary artery systolic pressure.  3. Left atrial size was severely dilated.  4. Right atrial size was severely dilated.  5. The mitral valve is grossly normal. Severe mitral valve regurgitation.  No evidence of mitral stenosis.  6. Tricuspid valve regurgitation is severe.  7. The aortic valve has been repaired/replaced. Aortic valve  regurgitation is not visualized. No aortic stenosis is present.  8. Aortic dilatation noted. 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  9. The inferior vena cava is dilated in size with <50% respiratory  variability, suggesting right atrial pressure of 15 mmHg.   Patient Profile     74 y.o. female history of newly diagnosed systolic heart failure presented to the hospital with worsening shortness of breath.  Assessment & Plan    1.  Acute on chronic systolic heart failure due to nonischemic cardiomyopathy: Net out 5.5 L.  She was switched to [facility] yesterday.  Blood pressure is well controlled.  We Kalei Meda continue with diuresis as her kidney function is stable and she continues to have JVD.    2.  History of AVR with Saint Jude mechanical valve: INR 2-2.5 due to subdural bleed.  Valves stable.  3.  Hypertension: Blood pressure controlled on Entresto  4.  Severe mitral and tricuspid regurgitation: May improve with diuresis  5.  Permanent atrial fibrillation: On Coumadin for a CHA2DS2-VASc of 4 with a mechanical mitral valve.  She does not tolerate beta-blockers as she has had severe depression.    6.  Obstructive sleep apnea: CPAP compliance encouraged  7.  Hyperlipidemia: Continue statin    8.  Hypokalemia: Repleted with 120 mEq of potassium over the next 12 hours.  For questions or updates, please contact [place] Please consult [email or web address] for contact info under        Signed, Khalen Styer Meredith Leeds, MD  09/27/2019, 8:47 AM

## 2019-09-27 NOTE — Progress Notes (Signed)
  Placed patient on home CPAP via nasal pillows. Filled chamber with distilled H@O  per pt request. RN aware.

## 2019-09-27 NOTE — Progress Notes (Signed)
PROGRESS NOTE  [name]  DOB: 07-Jun-1945  PCP: Maurice Small, MD NOB:[id number]  DOA: 09/24/2019  LOS: 2 days   Chief Complaint  Patient presents with  . Shortness of Breath   Brief narrative: [name] is a 74 y.o. female with PMH significant for HFrEF (EF decreased at 30-35% by TTE 09/17/2019), AS s/p mechanical AVR, permanent A. fib, chronic anticoagulation with Coumadin (goal INR 2-2.5 due to prior history of SDH), hypertension, hyperlipidemia, ascending aortic aneurysm, and OSA on CPAP. Patient presented to the ED on 6/17 with progressively worsening shortness of breath, orthopnea, dyspnea on exertion, bilateral pedal edema for last several days. She says she has been taking her Lasix daily and reports good urine output.   In the ED, O2 saturation was 88-89% on room air, improved to 96% on 2 L. Blood pressure 128/84, heart rate 104. Labs are notable for sodium 139, potassium 3.4, bicarb 26, BUN 12, creatinine 0.89, WBC 9.4, hemoglobin 12.9, platelets 172,000, lactic acid 1.0, 1.2, high-sensitivity troponin I 26 >> 30, INR 2.5, BNP 696.5, lipase 16. SARS-CoV-2 PCR is negative. Portable chest x-ray showed prior sternotomy changes with marked central vascular congestion/pulmonary edema.  Patient was given IV Lasix 40 mg once. Patient was admitted under hospitalist service for further evaluation and management.  Subjective: Patient was seen and examined this afternoon. Sitting at the edge of the bed. On low-flow oxygen by nasal cannula.  Not in distress.  Improving bilateral pedal edema.  Assessment/Plan: Acute respiratory failure with hypoxia  Acute on chronic combined systolic and diastolic CHF Essential hypertension -Presented with progressively worsening worsening shortness of breath, fluid retention. -CXR with pulmonary edema. BNP 696.5.  -recent TTE on 09/17/2019 showed reduction in EF to 30-35% with global hypokinesis of LV, severely dilated atria, severely  elevated pulmonary artery systolic pressure and TR regurgitation. -Home meds include Lasix 80 mg daily, Cardizem 240 mg daily, losartan 100 mg daily. -Currently Lasix 60 mg IV twice daily and Entresto. Cardiology consult appreciated.  -Monitor intake and output, daily weight, blood pressure, creatinine and electrolytes. -Advised on low-sodium diet with fluid restriction  Elevated troponin Hyperlipidemia -high-sensitivity troponin I 26 >> 30 -Likely demand ischemia secondary to CHF. Continue to monitor. -Continue statin and Coumadin.  Permanent atrial fibrillation AS s/p AVR with Saint Jude mechanical prosthesis History of subdural hematoma with current goal INR 2.0-2.5: -Remains in atrial fibrillation with controlled rate. -Holding diltiazem with soft blood pressure, may benefit from switching to beta-blocker if tolerated given reduced EF (allergy list side effect of depression from metoprolol) -Continue Coumadin per pharmacy  Hypokalemia: -Potassium low at 3 this morning. On daily potassium supplement.  Further supplementation given this morning.  Hypothyroidism: Continue Synthroid  OSA: Continue CPAP nightly.  Mobility: Evaluation Code Status:   Code Status: Full Code per chart DVT prophylaxis:  Coumadin Antimicrobials:  None Fluid: None  Diet:  Diet Order            Diet Heart Room service appropriate? Yes; Fluid consistency: Thin  Diet effective now                 Consultants: Cardiology consultation called Family Communication:  None at bedside  Status is: Observation  The patient will require care spanning > 2 midnights and should be moved to inpatient because: IV treatments appropriate due to intensity of illness or inability to take PO and Inpatient level of care appropriate due to severity of illness  Dispo: The patient is from: Home  Anticipated d/c is to: Home              Anticipated d/c date is: 2 days              Patient currently  is not medically stable to d/c.  Infusions:  . sodium chloride      Scheduled Meds: . furosemide  60 mg Intravenous Q12H  . levothyroxine  100 mcg Oral Q0600  . polyethylene glycol  17 g Oral Daily  . potassium chloride  40 mEq Oral Daily  . potassium chloride  40 mEq Oral Q4H  . pravastatin  20 mg Oral Daily  . sacubitril-valsartan  1 tablet Oral BID  . sertraline  100 mg Oral Daily  . sodium chloride flush  3 mL Intravenous Q12H  . warfarin  2 mg Oral ONCE-1600  . Warfarin - Pharmacist Dosing Inpatient   Does not apply q1600    Antimicrobials: Anti-infectives (From admission, onward)   None      PRN meds: sodium chloride, acetaminophen, ondansetron (ZOFRAN) IV, sodium chloride flush   Objective: Vitals:   09/27/19 0503 09/27/19 0721  BP: 110/72 108/69  Pulse: 78 77  Resp: 18 20  Temp: 98.2 F (36.8 C) 98.3 F (36.8 C)  SpO2: 97% 97%    Intake/Output Summary (Last 24 hours) at 09/27/2019 1417 Last data filed at 09/27/2019 0900 Gross per 24 hour  Intake 560 ml  Output 3690 ml  Net -3130 ml   Filed Weights   09/25/19 1559 09/26/19 0216 09/27/19 0039  Weight: 102.5 kg 101.7 kg 99.7 kg   Weight change: -2.845 kg Body mass index is 34.41 kg/m.   Physical Exam: General exam: Appears calm and comfortable.  Not in physical distress. Skin: No rashes, lesions or ulcers. HEENT: Atraumatic, normocephalic, supple neck, no obvious bleeding Lungs: Clear to auscultation bilaterally.  No crackles or wheezing CVS: Regular rate and rhythm, metallic heart sound GI/Abd soft, nontender, nondistended, bowel sound present CNS: Alert, awake, oriented x3 Psychiatry: Mood appropriate Extremities: 1+ bilateral pedal edema improving, no calf tenderness  Data Review: I have personally reviewed the laboratory data and studies available.  Recent Labs  Lab 09/24/19 1742 09/25/19 0545 09/26/19 0519  WBC 9.4 7.7 6.2  NEUTROABS 7.5  --   --   HGB 12.9 12.2 12.0  HCT 39.7 38.0  37.7  MCV 95.9 97.4 97.7  PLT 172 171 165   Recent Labs  Lab 09/24/19 1742 09/25/19 0545 09/26/19 0519 09/27/19 0651  NA 139 140 142 141  K 3.4* 3.1* 3.4* 3.0*  CL 102 101 100 97*  CO2 26 29 33* 34*  GLUCOSE 111* 105* 103* 108*  BUN 12 12 13 13   CREATININE 0.89 0.75 0.79 0.63  CALCIUM 8.8* 8.4* 8.6* 8.6*  MG  --  1.8  --   --     Signed, Terrilee Croak, MD [facility] Pager: [phone] (Secure Chat preferred). 09/27/2019

## 2019-09-27 NOTE — Care Management (Signed)
[id number] 09-27-19 Case Manager was consulted for [facility]- Benefits check submitted for [facility]. Case Manager will follow for cost. Graves-Bigelow, Ocie Cornfield, RN,BSN Case Manager

## 2019-09-28 DIAGNOSIS — I361 Nonrheumatic tricuspid (valve) insufficiency: Secondary | ICD-10-CM

## 2019-09-28 DIAGNOSIS — I340 Nonrheumatic mitral (valve) insufficiency: Secondary | ICD-10-CM

## 2019-09-28 LAB — BASIC METABOLIC PANEL
Anion gap: 6 (ref 5–15)
BUN: 15 mg/dL (ref 8–23)
CO2: 38 mmol/L — ABNORMAL HIGH (ref 22–32)
Calcium: 9 mg/dL (ref 8.9–10.3)
Chloride: 101 mmol/L (ref 98–111)
Creatinine, Ser: 0.87 mg/dL (ref 0.44–1.00)
GFR calc Af Amer: >60 mL/min (ref >60)
GFR calc non Af Amer: >60 mL/min (ref >60)
Glucose, Bld: 98 mg/dL (ref 70–99)
Potassium: 4 mmol/L (ref 3.5–5.1)
Sodium: 145 mmol/L (ref 135–145)

## 2019-09-28 LAB — CBC
HCT: 40.5 % (ref 36.0–46.0)
Hemoglobin: 12.7 g/dL (ref 12.0–15.0)
MCH: 30.8 pg (ref 26.0–34.0)
MCHC: 31.4 g/dL (ref 30.0–36.0)
MCV: 98.3 fL (ref 80.0–100.0)
Platelets: 198 10*3/uL (ref 150–400)
RBC: 4.12 MIL/uL (ref 3.87–5.11)
RDW: 16.4 % — ABNORMAL HIGH (ref 11.5–15.5)
WBC: 4.4 10*3/uL (ref 4.0–10.5)
nRBC: 0 % (ref 0.0–0.2)

## 2019-09-28 LAB — PROTIME-INR
INR: 2.6 — ABNORMAL HIGH (ref 0.8–1.2)
Prothrombin Time: 27.2 seconds — ABNORMAL HIGH (ref 11.4–15.2)

## 2019-09-28 MED ORDER — WARFARIN SODIUM 3 MG PO TABS
3.0000 mg | ORAL_TABLET | Freq: Once | ORAL | Status: AC
  Administered 2019-09-28: 3 mg via ORAL
  Filled 2019-09-28: qty 1

## 2019-09-28 MED ORDER — CARVEDILOL 3.125 MG PO TABS
3.1250 mg | ORAL_TABLET | Freq: Two times a day (BID) | ORAL | Status: DC
  Administered 2019-09-28 – 2019-09-29 (×3): 3.125 mg via ORAL
  Filled 2019-09-28 (×3): qty 1

## 2019-09-28 NOTE — Telephone Encounter (Signed)
Patient needs split night study scheduled for 10/14/2019 0800 cancelled and a CPAP titration study scheduled, order placed. Patient contacted, she is currently hospitalized, and plan of care reviewed. She will wait for a call to schedule the CPAP titration study.

## 2019-09-28 NOTE — Progress Notes (Addendum)
PROGRESS NOTE  [name]  DOB: 07-May-1945  PCP: Maurice Small, MD IHK:[id number]  DOA: 09/24/2019  LOS: 3 days   Chief Complaint  Patient presents with  . Shortness of Breath   Brief narrative: [name] is a 74 y.o. female with PMH significant for HFrEF (EF decreased at 30-35% by TTE 09/17/2019), AS s/p mechanical AVR, permanent A. fib, chronic anticoagulation with Coumadin (goal INR 2-2.5 due to prior history of SDH), hypertension, hyperlipidemia, ascending aortic aneurysm, and OSA on CPAP. Patient presented to the ED on 6/17 with progressively worsening shortness of breath, orthopnea, dyspnea on exertion, bilateral pedal edema for last several days. She says she has been taking her Lasix daily and reports good urine output.   In the ED, O2 saturation was 88-89% on room air, improved to 96% on 2 L. Blood pressure 128/84, heart rate 104. Labs are notable for sodium 139, potassium 3.4, bicarb 26, BUN 12, creatinine 0.89, WBC 9.4, hemoglobin 12.9, platelets 172,000, lactic acid 1.0, 1.2, high-sensitivity troponin I 26 >> 30, INR 2.5, BNP 696.5, lipase 16. SARS-CoV-2 PCR is negative. Portable chest x-ray showed prior sternotomy changes with marked central vascular congestion/pulmonary edema.  Patient was given IV Lasix 40 mg once. Patient was admitted under hospitalist service for further evaluation and management.  Subjective: Patient was seen and examined this morning.  Lying in bed.  Not in distress.  Remains on low-flow oxygen. Being diuresed with IV Lasix, net diuresis 1.6 L last 24 hours, 6.5 L since admission. Noted supplemental oxygen at home.  Currently on low-flow oxygen and desats further on ambulation..  Assessment/Plan: Acute respiratory failure with hypoxia  Acute on chronic combined systolic and diastolic CHF Essential hypertension -Presented with progressively worsening worsening shortness of breath, fluid retention. -CXR with pulmonary edema. BNP  696.5.  -recent TTE on 09/17/2019 showed reduction in EF to 30-35% with global hypokinesis of LV, severely dilated atria, severely elevated pulmonary artery systolic pressure and TR regurgitation. -Home meds include Lasix 80 mg daily, Cardizem 240 mg daily, losartan 100 mg daily. -Currently Lasix 60 mg IV twice daily and Entresto.  Discussed with cardiologist this morning. -Coreg added this morning. -Continue to monitor intake and output, daily weight, blood pressure, creatinine and electrolytes. -Advised on low-sodium diet with fluid restriction  Elevated troponin Hyperlipidemia -high-sensitivity troponin I 26 >> 30 -Likely demand ischemia secondary to CHF. Continue to monitor. -Continue statin and Coumadin.  Permanent atrial fibrillation AS s/p AVR with Saint Jude mechanical prosthesis History of subdural hematoma with current goal INR 2.0-2.5: -Remains in atrial fibrillation with controlled rate. -Holding diltiazem with soft blood pressure, may benefit from switching to beta-blocker if tolerated given reduced EF (allergy list side effect of depression from metoprolol) -Continue Coumadin per pharmacy  Chronic hypokalemia: -On daily potassium replacement.  Potassium 4 this morning.   Hypothyroidism: Continue Synthroid  OSA: Continue CPAP nightly.  Mobility: Evaluation Code Status:   Code Status: Full Code per chart DVT prophylaxis:  Coumadin Antimicrobials:  None Fluid: None  Diet:  Diet Order            Diet Heart Room service appropriate? Yes; Fluid consistency: Thin  Diet effective now                 Consultants: Cardiology consultation called Family Communication:  None at bedside  Status is: Observation  The patient will require care spanning > 2 midnights and should be moved to inpatient because needs further diuresis. Dispo: The patient is  from: Home              Anticipated d/c is to: Home              Anticipated d/c date is: Likely tomorrow               Patient currently is not medically stable to d/c.  Infusions:  . sodium chloride      Scheduled Meds: . carvedilol  3.125 mg Oral BID WC  . furosemide  60 mg Intravenous Q12H  . levothyroxine  100 mcg Oral Q0600  . polyethylene glycol  17 g Oral Daily  . potassium chloride  40 mEq Oral Daily  . pravastatin  20 mg Oral Daily  . sacubitril-valsartan  1 tablet Oral BID  . sertraline  100 mg Oral Daily  . sodium chloride flush  3 mL Intravenous Q12H  . warfarin  3 mg Oral ONCE-1600  . Warfarin - Pharmacist Dosing Inpatient   Does not apply q1600    Antimicrobials: Anti-infectives (From admission, onward)   None      PRN meds: sodium chloride, acetaminophen, ondansetron (ZOFRAN) IV, sodium chloride flush   Objective: Vitals:   09/28/19 0329 09/28/19 0759  BP: 104/85 108/73  Pulse: 87 83  Resp: 18 20  Temp: 98 F (36.7 C) 99 F (37.2 C)  SpO2: 94% 96%    Intake/Output Summary (Last 24 hours) at 09/28/2019 1014 Last data filed at 09/28/2019 1013 Gross per 24 hour  Intake 600 ml  Output 2100 ml  Net -1500 ml   Filed Weights   09/26/19 0216 09/27/19 0039 09/28/19 0329  Weight: 101.7 kg 99.7 kg 99.5 kg   Weight change: -0.181 kg Body mass index is 34.35 kg/m.   Physical Exam: General exam: Appears calm and comfortable.  Not in physical distress. Skin: No rashes, lesions or ulcers. HEENT: Atraumatic, normocephalic, supple neck, no obvious bleeding Lungs: Clear to auscultation bilaterally CVS: Regular rate and rhythm, metallic heart sound because of mechanical valve GI/Abd soft, nontender, nondistended, bowel sound present CNS: Alert, awake, oriented x3 Psychiatry: Mood appropriate Extremities: Improving bilateral pedal edema, no calf tenderness  Data Review: I have personally reviewed the laboratory data and studies available.  Recent Labs  Lab 09/24/19 1742 09/25/19 0545 09/26/19 0519 09/28/19 0543  WBC 9.4 7.7 6.2 4.4  NEUTROABS 7.5  --   --   --    HGB 12.9 12.2 12.0 12.7  HCT 39.7 38.0 37.7 40.5  MCV 95.9 97.4 97.7 98.3  PLT 172 171 165 198   Recent Labs  Lab 09/24/19 1742 09/25/19 0545 09/26/19 0519 09/27/19 0651 09/28/19 0543  NA 139 140 142 141 145  K 3.4* 3.1* 3.4* 3.0* 4.0  CL 102 101 100 97* 101  CO2 26 29 33* 34* 38*  GLUCOSE 111* 105* 103* 108* 98  BUN 12 12 13 13 15   CREATININE 0.89 0.75 0.79 0.63 0.87  CALCIUM 8.8* 8.4* 8.6* 8.6* 9.0  MG  --  1.8  --   --   --     Signed, Terrilee Croak, MD [facility] Pager: [phone] (Secure Chat preferred). 09/28/2019

## 2019-09-28 NOTE — Progress Notes (Signed)
Progress Note  Patient Name: [name] Date of Encounter: 09/28/2019  [facility] Cardiologist: Buford Dresser, MD   Subjective   Excellent diuresis continues. However, she had to stop walking due to desaturation while walking the hallway without oxygen yesterday. Net diuresis 1.6 l/24h, 6.5 l since admission. Weight similar to yesterday, down 3 kg since admission. In 2020, weight was 228-233, so she has lost real weight and "dry weight" is uncertain. Creat unchanged. K replete 4.0.  Inpatient Medications    Scheduled Meds: . furosemide  60 mg Intravenous Q12H  . levothyroxine  100 mcg Oral Q0600  . polyethylene glycol  17 g Oral Daily  . potassium chloride  40 mEq Oral Daily  . pravastatin  20 mg Oral Daily  . sacubitril-valsartan  1 tablet Oral BID  . sertraline  100 mg Oral Daily  . sodium chloride flush  3 mL Intravenous Q12H  . warfarin  3 mg Oral ONCE-1600  . Warfarin - Pharmacist Dosing Inpatient   Does not apply q1600   Continuous Infusions: . sodium chloride     PRN Meds: sodium chloride, acetaminophen, ondansetron (ZOFRAN) IV, sodium chloride flush   Vital Signs    Vitals:   09/27/19 1938 09/28/19 0006 09/28/19 0329 09/28/19 0759  BP: 100/84 101/70 104/85 108/73  Pulse: 82 99 87 83  Resp: 18 18 18 20   Temp: 98.1 F (36.7 C) 97.6 F (36.4 C) 98 F (36.7 C) 99 F (37.2 C)  TempSrc: Oral Oral Oral Oral  SpO2: 98% 92% 94% 96%  Weight:   99.5 kg   Height:        Intake/Output Summary (Last 24 hours) at 09/28/2019 0837 Last data filed at 09/28/2019 0332 Gross per 24 hour  Intake 360 ml  Output 1400 ml  Net -1040 ml   Last 3 Weights 09/28/2019 09/27/2019 09/26/2019  Weight (lbs) 219 lb 4.8 oz 219 lb 11.2 oz 224 lb 1.6 oz  Weight (kg) 99.474 kg 99.655 kg 101.651 kg      Telemetry    AFib, ventricular rate in the 80s while asleep, around 200s while awake.- Personally Reviewed  ECG    AFib, rate controlled, QS V1-V3 - Personally  Reviewed  Physical Exam  Morbidly obese GEN: No acute distress.   Neck:  7-8 cm JVD Cardiac: RRR, crisp mechanical valve clicks, very faint systolic ejection murmur at the right upper sternal border, 2/6 holosystolic murmur at the left lower sternal border and at the apex, no diastolic murmurs, rubs, or gallops.  Respiratory: Clear to auscultation bilaterally. GI: Soft, nontender, non-distended  MS:  No edema; No deformity. Neuro:  Nonfocal  Psych: Normal affect   Labs    High Sensitivity Troponin:   Recent Labs  Lab 09/24/19 1742 09/24/19 1856  TROPONINIHS 26* 30*      Chemistry Recent Labs  Lab 09/24/19 1742 09/25/19 0545 09/26/19 0519 09/27/19 0651 09/28/19 0543  NA 139   < > 142 141 145  K 3.4*   < > 3.4* 3.0* 4.0  CL 102   < > 100 97* 101  CO2 26   < > 33* 34* 38*  GLUCOSE 111*   < > 103* 108* 98  BUN 12   < > 13 13 15   CREATININE 0.89   < > 0.79 0.63 0.87  CALCIUM 8.8*   < > 8.6* 8.6* 9.0  PROT 6.8  --   --   --   --   ALBUMIN 3.6  --   --   --   --  AST 24  --   --   --   --   ALT 20  --   --   --   --   ALKPHOS 157*  --   --   --   --   BILITOT 1.4*  --   --   --   --   GFRNONAA >60   < > >60 >60 >60  GFRAA >60   < > >60 >60 >60  ANIONGAP 11   < > 9 10 6    < > = values in this interval not displayed.     Hematology Recent Labs  Lab 09/25/19 0545 09/26/19 0519 09/28/19 0543  WBC 7.7 6.2 4.4  RBC 3.90 3.86* 4.12  HGB 12.2 12.0 12.7  HCT 38.0 37.7 40.5  MCV 97.4 97.7 98.3  MCH 31.3 31.1 30.8  MCHC 32.1 31.8 31.4  RDW 17.0* 16.9* 16.4*  PLT 171 165 198    BNP Recent Labs  Lab 09/24/19 1742  BNP 696.5*     DDimer No results for input(s): DDIMER in the last 168 hours.   Radiology    No results found.  Cardiac Studies   TTE 09/17/19 1. Left ventricular ejection fraction, by estimation, is 30 to 35%. The  left ventricle has moderately decreased function. The left ventricle  demonstrates global hypokinesis. The left ventricular  internal cavity size  was mildly dilated. There is moderate  asymmetric left ventricular hypertrophy. Left ventricular diastolic  function could not be evaluated.  2. Right ventricular systolic function is moderately reduced. The right  ventricular size is moderately enlarged. There is severely elevated  pulmonary artery systolic pressure.  3. Left atrial size was severely dilated.  4. Right atrial size was severely dilated.  5. The mitral valve is grossly normal. Severe mitral valve regurgitation.  No evidence of mitral stenosis.  6. Tricuspid valve regurgitation is severe.  7. The aortic valve has been repaired/replaced. Aortic valve  regurgitation is not visualized. No aortic stenosis is present.  8. Aortic dilatation noted. 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  9. The inferior vena cava is dilated in size with <50% respiratory  variability, suggesting right atrial pressure of 15 mmHg.    Patient Profile     74 y.o. 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  Nonischemic nuclear test, new severe MR and TR, admitted with acute HF exacerbation  Assessment & Plan    1. CHF: cause of decline in EF uncertain. No angina/CAD history  (normal cath 2012) and "normal" perfusion on very recent nuclear scan. MR has been moderate on previous echos, now severe. AFib rate control challenging. For now focus on aggressive treatment of HF with meds, diuresis, good rate control. Entresto started 6/19. Blood pressure running a little low. Will not increase the dose for now. In 2020, weight was 228-233, so she has lost real weight and "dry weight" is uncertain. She no longer has dependent edema, but her jugular veins remain distended. This may be related to severe tricuspid regurgitation rather than overall  hypervolemia. Consider discontinuing IV diuretics tomorrow. 2. MR: central jet, presumably secondary to CHF. TEE 2015 with moderate MR. Reevaluate after full diuresis and HF compensation, if still severe consider MitraClip. 3. AFib: Previously stopped beta blockers due to severe depression. Still on diltiazem despite low EF. On  warfarin. Failed maintenance of SR on dofetilide and amiodarone. Severe biatrial dilation. Likely a permanent arrhythmia. Rate control is mediocre. We discussed her previous side effects with beta-blocker. She has never taken carvedilol. We will try it (very low-dose), since this will be beneficial for both rate control and heart failure management. 4.  S/P mechanical AVR: normal prosthesis function. 5. TR: markedly dilated RV, history of OSA and morbid obesity, now with superimposed left heart systolic failure. Encourage CPAP use.     For questions or updates, please contact [place] Please consult [email or web address] for contact info under        Signed, Sanda Klein, MD  09/28/2019, 8:37 AM

## 2019-09-28 NOTE — Telephone Encounter (Signed)
appt has been switched over to Cpap titration study I have also left pt a message appt did not change just the type of appt

## 2019-09-28 NOTE — TOC Benefit Eligibility Note (Signed)
Transition of Care [facility]) Benefit Eligibility Note    Patient Details  Name: [name] MRN: [id number] Date of Birth: 1945-10-30   Medication/Dose: Delene Loll 24-26  Covered?: Yes     Prescription Coverage Preferred Pharmacy: [facility]  Spoke with Person/Company/Phone Number:: [facility]  Co-Pay: $147.01 for 30 day retail  Prior Approval: No          Delorse Lek Phone Number: 09/28/2019, 11:01 AM

## 2019-09-28 NOTE — Discharge Instructions (Addendum)
Heart Failure, Self Care Heart failure is a serious condition. This sheet explains things you need to do to take care of yourself at home. To help you stay as healthy as possible, you may be asked to change your diet, take certain medicines, and make other changes in your life. Your doctor may also give you more specific instructions. If you have problems or questions, call your doctor. What are the risks? Having heart failure makes it more likely for you to have some problems. These problems can get worse if you do not take good care of yourself. Problems may include:  Blood clotting problems. This may cause a stroke.  Damage to the kidneys, liver, or lungs.  Abnormal heart rhythms. Supplies needed:  Scale for weighing yourself.  Blood pressure monitor.  Notebook.  Medicines. How to care for yourself when you have heart failure Medicines Take over-the-counter and prescription medicines only as told by your doctor. Take your medicines every day.  Do not stop taking your medicine unless your doctor tells you to do so.  Do not skip any medicines.  Get your prescriptions refilled before you run out of medicine. This is important. Eating and drinking   Eat heart-healthy foods. Talk with a diet specialist (dietitian) to create an eating plan.  Choose foods that: ? Have no trans fat. ? Are low in saturated fat and cholesterol.  Choose healthy foods, such as: ? Fresh or frozen fruits and vegetables. ? Fish. ? Low-fat (lean) meats. ? Legumes, such as beans, peas, and lentils. ? Fat-free or low-fat dairy products. ? Whole-grain foods. ? High-fiber foods.  Limit salt (sodium) if told by your doctor. Ask your diet specialist to tell you which seasonings are healthy for your heart.  Cook in healthy ways instead of frying. Healthy ways of cooking include roasting, grilling, broiling, baking, poaching, steaming, and stir-frying.  Limit how much fluid you drink, if told by your  doctor. Alcohol use  Do not drink alcohol if: ? Your doctor tells you not to drink. ? Your heart was damaged by alcohol, or you have very bad heart failure. ? You are pregnant, may be pregnant, or are planning to become pregnant.  If you drink alcohol: ? Limit how much you use to:  0-1 drink a day for women.  0-2 drinks a day for men. ? Be aware of how much alcohol is in your drink. In the [place] one drink equals one 12 oz bottle of beer (355 mL), one 5 oz glass of wine (148 mL), or one 1 oz glass of hard liquor (44 mL). Lifestyle   Do not use any products that contain nicotine or tobacco, such as cigarettes, e-cigarettes, and chewing tobacco. If you need help quitting, ask your doctor. ? Do not use nicotine gum or patches before talking to your doctor.  Do not use illegal drugs.  Lose weight if told by your doctor.  Do physical activity if told by your doctor. Talk to your doctor before you begin an exercise if: ? You are an older adult. ? You have very bad heart failure.  Learn to manage stress. If you need help, ask your doctor.  Get rehab (rehabilitation) to help you stay independent and to help with your quality of life.  Plan time to rest when you get tired. Check weight and blood pressure   Weigh yourself every day. This will help you to know if fluid is building up in your body. ? Weigh yourself every  morning after you pee (urinate) and before you eat breakfast. ? Wear the same amount of clothing each time. ? Write down your daily weight. Give your record to your doctor.  Check and write down your blood pressure as told by your doctor.  Check your pulse as told by your doctor. Dealing with very hot and very cold weather  If it is very hot: ? Avoid activities that take a lot of energy. ? Use air conditioning or fans, or find a cooler place. ? Avoid caffeine and alcohol. ? Wear clothing that is loose-fitting, lightweight, and light-colored.  If it is very  cold: ? Avoid activities that take a lot of energy. ? Layer your clothes. ? Wear mittens or gloves, a hat, and a scarf when you go outside. ? Avoid alcohol. Follow these instructions at home:  Stay up to date with shots (vaccines). Get pneumococcal and flu (influenza) shots.  Keep all follow-up visits as told by your doctor. This is important. Contact a doctor if:  You gain weight quickly.  You have increasing shortness of breath.  You cannot do your normal activities.  You get tired easily.  You cough a lot.  You don't feel like eating or feel like you may vomit (nauseous).  You become puffy (swell) in your hands, feet, ankles, or belly (abdomen).  You cannot sleep well because it is hard to breathe.  You feel like your heart is beating fast (palpitations).  You get dizzy when you stand up. Get help right away if:  You have trouble breathing.  You or someone else notices a change in your behavior, such as having trouble staying awake.  You have chest pain or discomfort.  You pass out (faint). These symptoms may be an emergency. Do not wait to see if the symptoms will go away. Get medical help right away. Call your local emergency services (911 in the [place] Do not drive yourself to the hospital. Summary  Heart failure is a serious condition. To care for yourself, you may have to change your diet, take medicines, and make other lifestyle changes.  Take your medicines every day. Do not stop taking them unless your doctor tells you to do so.  Eat heart-healthy foods, such as fresh or frozen fruits and vegetables, fish, lean meats, legumes, fat-free or low-fat dairy products, and whole-grain or high-fiber foods.  Ask your doctor if you can drink alcohol. You may have to stop alcohol use if you have very bad heart failure.  Contact your doctor if you gain weight quickly or feel that your heart is beating too fast. Get help right away if you pass out, or have chest pain  or trouble breathing. This information is not intended to replace advice given to you by your health care provider. Make sure you discuss any questions you have with your health care provider. Document Revised: 07/08/2018 Document Reviewed: 07/09/2018 Elsevier Patient Education  [place] on my medicine - Coumadin   (Warfarin)  This medication education was reviewed with me or my healthcare representative as part of my discharge preparation.    Why was Coumadin prescribed for you? Coumadin was prescribed for you because you have a blood clot or a medical condition that can cause an increased risk of forming blood clots. Blood clots can cause serious health problems by blocking the flow of blood to the heart, lung, or brain. Coumadin can prevent harmful blood clots from forming. As a reminder your indication for Coumadin  is:   Blood Clot Prevention After Heart Valve Surgery  What test will check on my response to Coumadin? While on Coumadin (warfarin) you will need to have an INR test regularly to ensure that your dose is keeping you in the desired range. The INR (international normalized ratio) number is calculated from the result of the laboratory test called prothrombin time (PT).  If an INR APPOINTMENT HAS NOT ALREADY BEEN MADE FOR YOU please schedule an appointment to have this lab work done by your health care provider within 7 days. Your INR goal is usually a number between:  2 to 3 or your provider may give you a more narrow range like 2-2.5.  Ask your health care provider during an office visit what your goal INR is.  What  do you need to  know  About  COUMADIN? Take Coumadin (warfarin) exactly as prescribed by your healthcare provider about the same time each day.  DO NOT stop taking without talking to the doctor who prescribed the medication.  Stopping without other blood clot prevention medication to take the place of Coumadin may increase your risk of developing  a new clot or stroke.  Get refills before you run out.  What do you do if you miss a dose? If you miss a dose, take it as soon as you remember on the same day then continue your regularly scheduled regimen the next day.  Do not take two doses of Coumadin at the same time.  Important Safety Information A possible side effect of Coumadin (Warfarin) is an increased risk of bleeding. You should call your healthcare provider right away if you experience any of the following: ? Bleeding from an injury or your nose that does not stop. ? Unusual colored urine (red or dark brown) or unusual colored stools (red or black). ? Unusual bruising for unknown reasons. ? A serious fall or if you hit your head (even if there is no bleeding).  Some foods or medicines interact with Coumadin (warfarin) and might alter your response to warfarin. To help avoid this: ? Eat a balanced diet, maintaining a consistent amount of Vitamin K. ? Notify your provider about major diet changes you plan to make. ? Avoid alcohol or limit your intake to 1 drink for women and 2 drinks for men per day. (1 drink is 5 oz. wine, 12 oz. beer, or 1.5 oz. liquor.)  Make sure that ANY health care provider who prescribes medication for you knows that you are taking Coumadin (warfarin).  Also make sure the healthcare provider who is monitoring your Coumadin knows when you have started a new medication including herbals and non-prescription products.  Coumadin (Warfarin)  Major Drug Interactions  Increased Warfarin Effect Decreased Warfarin Effect  Alcohol (large quantities) Antibiotics (esp. Septra/Bactrim, Flagyl, Cipro) Amiodarone (Cordarone) Aspirin (ASA) Cimetidine (Tagamet) Megestrol (Megace) NSAIDs (ibuprofen, naproxen, etc.) Piroxicam (Feldene) Propafenone (Rythmol SR) Propranolol (Inderal) Isoniazid (INH) Posaconazole (Noxafil) Barbiturates (Phenobarbital) Carbamazepine (Tegretol) Chlordiazepoxide  (Librium) Cholestyramine (Questran) Griseofulvin Oral Contraceptives Rifampin Sucralfate (Carafate) Vitamin K   Coumadin (Warfarin) Major Herbal Interactions  Increased Warfarin Effect Decreased Warfarin Effect  Garlic Ginseng Ginkgo biloba Coenzyme Q10 Green tea St. John's wort    Coumadin (Warfarin) FOOD Interactions  Eat a consistent number of servings per week of foods HIGH in Vitamin K (1 serving =  cup)  Collards (cooked, or boiled & drained) Kale (cooked, or boiled & drained) Mustard greens (cooked, or boiled & drained) Parsley *serving size only =  cup Spinach (cooked, or boiled & drained) Swiss chard (cooked, or boiled & drained) Turnip greens (cooked, or boiled & drained)  Eat a consistent number of servings per week of foods MEDIUM-HIGH in Vitamin K (1 serving = 1 cup)  Asparagus (cooked, or boiled & drained) Broccoli (cooked, boiled & drained, or raw & chopped) Brussel sprouts (cooked, or boiled & drained) *serving size only =  cup Lettuce, raw (green leaf, endive, romaine) Spinach, raw Turnip greens, raw & chopped   These websites have more information on Coumadin (warfarin):  FailFactory.se; VeganReport.com.au;

## 2019-09-28 NOTE — Progress Notes (Signed)
ANTICOAGULATION CONSULT NOTE  Pharmacy Consult:  Coumadin Indication: Afib and MAVR  Allergies  Allergen Reactions  . Codeine Anaphylaxis    Jerking, involuntary jerking   . Beta Adrenergic Blockers     Makes her crazy  . Metoprolol     depression   . Propoxyphene Other (See Comments)    Bad vivid dreams  . Toprol Xl [Metoprolol Tartrate]     depression  . Dilantin [Phenytoin Sodium Extended] Rash and Itching    Patient Measurements: Height: 5\' 7"  (170.2 cm) Weight: 99.5 kg (219 lb 4.8 oz) (scale c) IBW/kg (Calculated) : 61.6  Vital Signs: Temp: 99 F (37.2 C) (06/21 0759) Temp Source: Oral (06/21 0759) BP: 108/73 (06/21 0759) Pulse Rate: 83 (06/21 0759)  Labs: Recent Labs    09/26/19 0519 09/27/19 0651 09/28/19 0543  HGB 12.0  --  12.7  HCT 37.7  --  40.5  PLT 165  --  198  LABPROT 24.9* 25.5* 27.2*  INR 2.3* 2.4* 2.6*  CREATININE 0.79 0.63 0.87    Estimated Creatinine Clearance: 68.8 mL/min (by C-G formula based on SCr of 0.87 mg/dL).  Assessment: Patient is a 74 yo female that presented to the ED with SOB.  Patient has a history of Afib and mechanical AVR on Coumadin 4mg  PO daily except 2mg  on Wed/Sun PTA per [facility] clinic note.   INR slightly supra-therapeutic at 2.6 today; no bleeding reported.   Goal of Therapy:  INR 2 - 2.5 d/t history of SDH Monitor platelets by anticoagulation protocol: Yes   Plan:  Coumadin 3mg  PO today Daily PT / INR   Monserrath Junio D. Mina Marble, PharmD, BCPS, [place] 09/28/2019, 8:27 AM

## 2019-09-29 ENCOUNTER — Ambulatory Visit: Payer: Medicare Other | Admitting: Adult Health

## 2019-09-29 ENCOUNTER — Encounter (HOSPITAL_COMMUNITY): Payer: Self-pay | Admitting: Internal Medicine

## 2019-09-29 LAB — CULTURE, BLOOD (ROUTINE X 2)
Culture: NO GROWTH
Culture: NO GROWTH
Special Requests: ADEQUATE

## 2019-09-29 LAB — BASIC METABOLIC PANEL
Anion gap: 9 (ref 5–15)
BUN: 19 mg/dL (ref 8–23)
CO2: 34 mmol/L — ABNORMAL HIGH (ref 22–32)
Calcium: 8.7 mg/dL — ABNORMAL LOW (ref 8.9–10.3)
Chloride: 100 mmol/L (ref 98–111)
Creatinine, Ser: 0.84 mg/dL (ref 0.44–1.00)
GFR calc Af Amer: >60 mL/min (ref >60)
GFR calc non Af Amer: >60 mL/min (ref >60)
Glucose, Bld: 91 mg/dL (ref 70–99)
Potassium: 3.4 mmol/L — ABNORMAL LOW (ref 3.5–5.1)
Sodium: 143 mmol/L (ref 135–145)

## 2019-09-29 LAB — PROTIME-INR
INR: 2.2 — ABNORMAL HIGH (ref 0.8–1.2)
Prothrombin Time: 23.7 seconds — ABNORMAL HIGH (ref 11.4–15.2)

## 2019-09-29 MED ORDER — FUROSEMIDE 40 MG PO TABS
40.0000 mg | ORAL_TABLET | Freq: Two times a day (BID) | ORAL | Status: DC
  Administered 2019-09-29 – 2019-09-30 (×3): 40 mg via ORAL
  Filled 2019-09-29 (×2): qty 1

## 2019-09-29 MED ORDER — CARVEDILOL 3.125 MG PO TABS
3.1250 mg | ORAL_TABLET | Freq: Two times a day (BID) | ORAL | Status: DC
  Administered 2019-09-30: 3.125 mg via ORAL
  Filled 2019-09-29: qty 1

## 2019-09-29 MED ORDER — WARFARIN SODIUM 2 MG PO TABS
4.0000 mg | ORAL_TABLET | Freq: Once | ORAL | Status: AC
  Administered 2019-09-29: 4 mg via ORAL
  Filled 2019-09-29: qty 2

## 2019-09-29 MED ORDER — POTASSIUM CHLORIDE 20 MEQ/15ML (10%) PO SOLN
40.0000 meq | Freq: Two times a day (BID) | ORAL | Status: DC
  Administered 2019-09-29 – 2019-09-30 (×2): 40 meq via ORAL
  Filled 2019-09-29 (×2): qty 30

## 2019-09-29 NOTE — Progress Notes (Signed)
Patient resting comfortably on home CPAP unit.

## 2019-09-29 NOTE — Progress Notes (Signed)
Progress Note  Patient Name: [name] Date of Encounter: 09/29/2019  [facility] Cardiologist: Buford Dresser, MD   Subjective   Continues excellent diuresis, another net 1.5L/24h and 8.5L since admission. Weight down to 215 lb. Tolerating low dose carvedilol so far. Ventricular rate 80-90s.  Inpatient Medications    Scheduled Meds:  carvedilol  3.125 mg Oral BID WC   furosemide  40 mg Oral BID   levothyroxine  100 mcg Oral Q0600   polyethylene glycol  17 g Oral Daily   potassium chloride  40 mEq Oral BID   pravastatin  20 mg Oral Daily   sacubitril-valsartan  1 tablet Oral BID   sertraline  100 mg Oral Daily   sodium chloride flush  3 mL Intravenous Q12H   warfarin  4 mg Oral ONCE-1600   Warfarin - Pharmacist Dosing Inpatient   Does not apply q1600   Continuous Infusions:  sodium chloride     PRN Meds: sodium chloride, acetaminophen, ondansetron (ZOFRAN) IV, sodium chloride flush   Vital Signs    Vitals:   09/29/19 0116 09/29/19 0454 09/29/19 0639 09/29/19 0747  BP: (!) 97/54 107/74 102/68 110/84  Pulse: 81 87  100  Resp: 18 18  20   Temp: 97.9 F (36.6 C) 97.9 F (36.6 C)  98.7 F (37.1 C)  TempSrc:  Oral  Oral  SpO2: 95% 93%  98%  Weight:  97.4 kg    Height:        Intake/Output Summary (Last 24 hours) at 09/29/2019 0907 Last data filed at 09/29/2019 0826 Gross per 24 hour  Intake 1770 ml  Output 3800 ml  Net -2030 ml   Last 3 Weights 09/29/2019 09/28/2019 09/27/2019  Weight (lbs) 214 lb 12.8 oz 219 lb 4.8 oz 219 lb 11.2 oz  Weight (kg) 97.433 kg 99.474 kg 99.655 kg      Telemetry    AFib, rate controlled  - Personally Reviewed  ECG    No new tarcing - Personally Reviewed  Physical Exam  Appears comfortable GEN: No acute distress.   Neck: 5-6 cm JVD, obvious v wave Cardiac: RRR, crisp mechanical valve clicks, 1/6 Ao ej murmur, no apical murmur, no diastolic murmurs, rubs, or gallops.  Respiratory: Clear to  auscultation bilaterally. GI: Soft, nontender, non-distended  MS: No edema; No deformity. Neuro:  Nonfocal  Psych: Normal affect   Labs    High Sensitivity Troponin:   Recent Labs  Lab 09/24/19 1742 09/24/19 1856  TROPONINIHS 26* 30*      Chemistry Recent Labs  Lab 09/24/19 1742 09/25/19 0545 09/27/19 0651 09/28/19 0543 09/29/19 0629  NA 139   < > 141 145 143  K 3.4*   < > 3.0* 4.0 3.4*  CL 102   < > 97* 101 100  CO2 26   < > 34* 38* 34*  GLUCOSE 111*   < > 108* 98 91  BUN 12   < > 13 15 19   CREATININE 0.89   < > 0.63 0.87 0.84  CALCIUM 8.8*   < > 8.6* 9.0 8.7*  PROT 6.8  --   --   --   --   ALBUMIN 3.6  --   --   --   --   AST 24  --   --   --   --   ALT 20  --   --   --   --   ALKPHOS 157*  --   --   --   --  BILITOT 1.4*  --   --   --   --   GFRNONAA >60   < > >60 >60 >60  GFRAA >60   < > >60 >60 >60  ANIONGAP 11   < > 10 6 9    < > = values in this interval not displayed.     Hematology Recent Labs  Lab 09/25/19 0545 09/26/19 0519 09/28/19 0543  WBC 7.7 6.2 4.4  RBC 3.90 3.86* 4.12  HGB 12.2 12.0 12.7  HCT 38.0 37.7 40.5  MCV 97.4 97.7 98.3  MCH 31.3 31.1 30.8  MCHC 32.1 31.8 31.4  RDW 17.0* 16.9* 16.4*  PLT 171 165 198    BNP Recent Labs  Lab 09/24/19 1742  BNP 696.5*     DDimer No results for input(s): DDIMER in the last 168 hours.   Radiology    No results found.  Cardiac Studies   TTE 09/17/19 1. Left ventricular ejection fraction, by estimation, is 30 to 35%. The  left ventricle has moderately decreased function. The left ventricle  demonstrates global hypokinesis. The left ventricular internal cavity size  was mildly dilated. There is moderate  asymmetric left ventricular hypertrophy. Left ventricular diastolic  function could not be evaluated.  2. Right ventricular systolic function is moderately reduced. The right  ventricular size is moderately enlarged. There is severely elevated  pulmonary artery systolic pressure.    3. Left atrial size was severely dilated.  4. Right atrial size was severely dilated.  5. The mitral valve is grossly normal. Severe mitral valve regurgitation.  No evidence of mitral stenosis.  6. Tricuspid valve regurgitation is severe.  7. The aortic valve has been repaired/replaced. Aortic valve  regurgitation is not visualized. No aortic stenosis is present.  8. Aortic dilatation noted. 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  9. The inferior vena cava is dilated in size with <50% respiratory  variability, suggesting right atrial pressure of 15 mmHg.   Patient Profile     74 y.o. 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  Nonischemic nuclear test, new severe MR and TR, admitted with acute HF exacerbation  Assessment & Plan    1. CHF: cause of decline in EF uncertain. No angina/CAD history  (normal cath 2012) and "normal" perfusion on very recent nuclear scan. MR has been moderate on previous echos, now severe (secondary?). For now focus on aggressive treatment of HF with meds, diuresis, good rate control.  - Entresto started 6/19. Blood pressure running a little low. Will not increase the dose for now. - tolerating starting dose carvedilol - change to PO diuretics. New target weight< 215 lb (maybe even lower) - ambulate without O2 today - discussed sodium restricted diet and weight monitoring in detail - possible DC tomorrow - extra KCl today 2. MR: central jet, presumably secondary to CHF. TEE 2015 with moderate MR. Very unimpressive findings by physical exam. Reevaluate after full diuresis and HF compensation, if still severe consider MitraClip. 3. AFib: tolerating. On warfarin. Failed maintenance of SR on dofetilide and amiodarone. Severe biatrial dilation. Likely a permanent arrhythmia. Rate  control is better with carvedilol. 4.  S/P mechanical AVR: normal prosthesis function. 5. TR: appears improved on exam, likely functional; markedly dilated RV, history of OSA and morbid obesity, now with superimposed left heart systolic failure. Encourage CPAP use.  For questions or updates, please contact [place] Please consult [email or web address] for contact info under        Signed, Sanda Klein, MD  09/29/2019, 9:07 AM

## 2019-09-29 NOTE — Progress Notes (Signed)
ANTICOAGULATION CONSULT NOTE  Pharmacy Consult:  Coumadin Indication: Afib and MAVR  Allergies  Allergen Reactions  . Codeine Anaphylaxis    Jerking, involuntary jerking   . Beta Adrenergic Blockers     Makes her crazy  . Metoprolol     depression   . Propoxyphene Other (See Comments)    Bad vivid dreams  . Toprol Xl [Metoprolol Tartrate]     depression  . Dilantin [Phenytoin Sodium Extended] Rash and Itching    Patient Measurements: Height: 5\' 7"  (170.2 cm) Weight: 97.4 kg (214 lb 12.8 oz) (scale c) IBW/kg (Calculated) : 61.6  Vital Signs: Temp: 98.7 F (37.1 C) (06/22 0747) Temp Source: Oral (06/22 0747) BP: 110/84 (06/22 0747) Pulse Rate: 100 (06/22 0747)  Labs: Recent Labs    09/27/19 0651 09/28/19 0543 09/29/19 0629  HGB  --  12.7  --   HCT  --  40.5  --   PLT  --  198  --   LABPROT 25.5* 27.2* 23.7*  INR 2.4* 2.6* 2.2*  CREATININE 0.63 0.87 0.84    Estimated Creatinine Clearance: 70.4 mL/min (by C-G formula based on SCr of 0.84 mg/dL).  Assessment: Patient is a 74 yo female that presented to the ED with SOB.  Patient has a history of Afib and mechanical AVR on Coumadin 4mg  PO daily except 2mg  on Wed/Sun PTA per [facility] clinic note.   INR therapeutic at 2.2 today; no bleeding reported.   Goal of Therapy:  INR 2 - 2.5 d/t history of SDH Monitor platelets by anticoagulation protocol: Yes   Plan:  Coumadin 4mg  PO today Daily PT / INR   Sheree Lalla D. Mina Marble, PharmD, BCPS, [place] 09/29/2019, 8:24 AM

## 2019-09-29 NOTE — Progress Notes (Signed)
PROGRESS NOTE  [name]  DOB: [birth year]  PCP: Maurice Small, MD YQM:[id number]  DOA: 09/24/2019  LOS: 4 days   Chief Complaint  Patient presents with  . Shortness of Breath   Brief narrative: [name] is a 74 y.o. female with PMH significant for HFrEF (EF decreased at 30-35% by TTE 09/17/2019), AS s/p mechanical AVR, permanent A. fib, chronic anticoagulation with Coumadin (goal INR 2-2.5 due to prior history of SDH), hypertension, hyperlipidemia, ascending aortic aneurysm, and OSA on CPAP. Patient presented to the ED on 6/17 with progressively worsening shortness of breath, orthopnea, dyspnea on exertion, bilateral pedal edema for last several days. She says she has been taking her Lasix daily and reports good urine output.   In the ED, O2 saturation was 88-89% on room air, improved to 96% on 2 L. Blood pressure 128/84, heart rate 104. Labs are notable for sodium 139, potassium 3.4, bicarb 26, BUN 12, creatinine 0.89, WBC 9.4, hemoglobin 12.9, platelets 172,000, lactic acid 1.0, 1.2, high-sensitivity troponin I 26 >> 30, INR 2.5, BNP 696.5, lipase 16. SARS-CoV-2 PCR is negative. Portable chest x-ray showed prior sternotomy changes with marked central vascular congestion/pulmonary edema.  Patient was given IV Lasix 40 mg once. Patient was admitted under hospitalist service for further evaluation and management.  Subjective: Patient was seen and examined this morning.  Sitting up at the edge of the bed.  Not in distress.  No new symptoms.  Remains on oxygen.   1.4 L diuresis in last 24 hours, more than 8 L since admission.  5 pound weight loss in last 24 hours, more than 20 pounds since admission.   -Currently on oral Lasix.  Chronically on potassium supplement.    Assessment/Plan: Acute respiratory failure with hypoxia  Acute on chronic combined systolic and diastolic CHF Essential hypertension -Presented with progressively worsening worsening shortness of breath,  fluid retention. -CXR with pulmonary edema. BNP 696.5.  -recent TTE on 09/17/2019 showed reduction in EF to 30-35% with global hypokinesis of LV, severely dilated atria, severely elevated pulmonary artery systolic pressure and TR regurgitation. -Home meds include Lasix 80 mg daily, Cardizem 240 mg daily, losartan 100 mg daily. -Switched from IV Lasix to oral Lasix this morning.  Currently on 40 mg twice daily.  Patient has been tolerating Coreg and Entresto.  -Continue to monitor intake and output, daily weight, blood pressure, creatinine and electrolytes. -Advised on low-sodium diet with fluid restriction  Elevated troponin Hyperlipidemia -high-sensitivity troponin I 26 >> 30 -Likely demand ischemia secondary to CHF. Continue to monitor. -Continue statin and Coumadin.  Permanent atrial fibrillation AS s/p AVR with [employer] mechanical prosthesis History of subdural hematoma with current goal INR 2.0-2.5: -Remains in atrial fibrillation with controlled rate. -Holding diltiazem with soft blood pressure, may benefit from switching to beta-blocker if tolerated given reduced EF (allergy list side effect of depression from metoprolol) -Continue Coumadin per pharmacy  Chronic hypokalemia: -On daily potassium replacement.  Potassium 3.4 this morning.   Hypothyroidism: Continue Synthroid  OSA: Continue CPAP nightly.  Mobility: Evaluation Code Status:   Code Status: Full Code per chart DVT prophylaxis:  Coumadin Antimicrobials:  None Fluid: None  Diet:  Diet Order            Diet Heart Room service appropriate? Yes; Fluid consistency: Thin  Diet effective now                 Consultants: Cardiology Family Communication:  None at bedside  Status is: Inpatient  Remains inpatient appropriate because:IV treatments appropriate due to intensity of illness or inability to take PO and Inpatient level of care appropriate due to severity of illness monitor hemodynamics.  Hopefully  discharge home tomorrow  Dispo: The patient is from: Home              Anticipated d/c is to: Home              Anticipated d/c date is: 1 day              Patient currently is not medically stable to d/c.  Infusions:  . sodium chloride      Scheduled Meds: . carvedilol  3.125 mg Oral BID WC  . furosemide  40 mg Oral BID  . levothyroxine  100 mcg Oral Q0600  . polyethylene glycol  17 g Oral Daily  . potassium chloride  40 mEq Oral BID  . pravastatin  20 mg Oral Daily  . sacubitril-valsartan  1 tablet Oral BID  . sertraline  100 mg Oral Daily  . sodium chloride flush  3 mL Intravenous Q12H  . warfarin  4 mg Oral ONCE-1600  . Warfarin - Pharmacist Dosing Inpatient   Does not apply q1600    Antimicrobials: Anti-infectives (From admission, onward)   None      PRN meds: sodium chloride, acetaminophen, ondansetron (ZOFRAN) IV, sodium chloride flush   Objective: Vitals:   09/29/19 0747 09/29/19 1207  BP: 110/84 115/77  Pulse: 100 79  Resp: 20 20  Temp: 98.7 F (37.1 C) 98.4 F (36.9 C)  SpO2: 98% 98%    Intake/Output Summary (Last 24 hours) at 09/29/2019 1351 Last data filed at 09/29/2019 0826 Gross per 24 hour  Intake 1020 ml  Output 2700 ml  Net -1680 ml   Filed Weights   09/27/19 0039 09/28/19 0329 09/29/19 0454  Weight: 99.7 kg 99.5 kg 97.4 kg   Weight change: -2.041 kg Body mass index is 33.64 kg/m.   Physical Exam: General exam: Appears calm and comfortable.  Not in physical distress. Skin: No rashes, lesions or ulcers. HEENT: Atraumatic, normocephalic, supple neck, no obvious bleeding Lungs: Clear to auscultation bilaterally.  No crackles or wheezing. CVS: Regular rate and rhythm, metallic heart sound because of mechanical valve GI/Abd soft, nontender, nondistended, bowel sound present CNS: Alert, awake, oriented x3 Psychiatry: Mood appropriate Extremities: Improving bilateral pedal edema, no calf tenderness  Data Review: I have personally  reviewed the laboratory data and studies available.  Recent Labs  Lab 09/24/19 1742 09/25/19 0545 09/26/19 0519 09/28/19 0543  WBC 9.4 7.7 6.2 4.4  NEUTROABS 7.5  --   --   --   HGB 12.9 12.2 12.0 12.7  HCT 39.7 38.0 37.7 40.5  MCV 95.9 97.4 97.7 98.3  PLT 172 171 165 198   Recent Labs  Lab 09/25/19 0545 09/26/19 0519 09/27/19 0651 09/28/19 0543 09/29/19 0629  NA 140 142 141 145 143  K 3.1* 3.4* 3.0* 4.0 3.4*  CL 101 100 97* 101 100  CO2 29 33* 34* 38* 34*  GLUCOSE 105* 103* 108* 98 91  BUN 12 13 13 15 19   CREATININE 0.75 0.79 0.63 0.87 0.84  CALCIUM 8.4* 8.6* 8.6* 9.0 8.7*  MG 1.8  --   --   --   --     Signed, Terrilee Croak, MD [facility] Pager: [phone] (Secure Chat preferred). 09/29/2019

## 2019-09-29 NOTE — Progress Notes (Signed)
SATURATION QUALIFICATIONS: (This note is used to comply with regulatory documentation for home oxygen)  Patient Saturations on Room Air at Rest = 94%  Patient Saturations on Room Air while Ambulating = 89%  Patient Saturations on 0 Liters of oxygen while Ambulating = N/A  Please briefly explain why patient needs home oxygen: N/A 

## 2019-09-30 ENCOUNTER — Other Ambulatory Visit: Payer: Self-pay | Admitting: Medical

## 2019-09-30 ENCOUNTER — Telehealth: Payer: Self-pay | Admitting: General Practice

## 2019-09-30 DIAGNOSIS — I340 Nonrheumatic mitral (valve) insufficiency: Secondary | ICD-10-CM

## 2019-09-30 LAB — BASIC METABOLIC PANEL
Anion gap: 12 (ref 5–15)
BUN: 18 mg/dL (ref 8–23)
CO2: 28 mmol/L (ref 22–32)
Calcium: 8.6 mg/dL — ABNORMAL LOW (ref 8.9–10.3)
Chloride: 100 mmol/L (ref 98–111)
Creatinine, Ser: 0.79 mg/dL (ref 0.44–1.00)
GFR calc Af Amer: >60 mL/min (ref >60)
GFR calc non Af Amer: >60 mL/min (ref >60)
Glucose, Bld: 78 mg/dL (ref 70–99)
Potassium: 4.1 mmol/L (ref 3.5–5.1)
Sodium: 140 mmol/L (ref 135–145)

## 2019-09-30 LAB — CBC
HCT: 40.6 % (ref 36.0–46.0)
Hemoglobin: 13 g/dL (ref 12.0–15.0)
MCH: 30.8 pg (ref 26.0–34.0)
MCHC: 32 g/dL (ref 30.0–36.0)
MCV: 96.2 fL (ref 80.0–100.0)
Platelets: 222 10*3/uL (ref 150–400)
RBC: 4.22 MIL/uL (ref 3.87–5.11)
RDW: 16.1 % — ABNORMAL HIGH (ref 11.5–15.5)
WBC: 4.2 10*3/uL (ref 4.0–10.5)
nRBC: 0 % (ref 0.0–0.2)

## 2019-09-30 LAB — PROTIME-INR
INR: 2.2 — ABNORMAL HIGH (ref 0.8–1.2)
Prothrombin Time: 23.4 seconds — ABNORMAL HIGH (ref 11.4–15.2)

## 2019-09-30 MED ORDER — SACUBITRIL-VALSARTAN 24-26 MG PO TABS: 1.0000 | ORAL_TABLET | Freq: Two times a day (BID) | ORAL | 0 refills | Status: DC

## 2019-09-30 MED ORDER — CARVEDILOL 3.125 MG PO TABS: 3.1250 mg | ORAL_TABLET | Freq: Two times a day (BID) | ORAL | 0 refills | Status: DC

## 2019-09-30 MED ORDER — WARFARIN SODIUM 3 MG PO TABS: 3.0000 mg | ORAL_TABLET | Freq: Once | ORAL | Status: DC

## 2019-09-30 MED ORDER — FUROSEMIDE 40 MG PO TABS: 40.0000 mg | ORAL_TABLET | Freq: Two times a day (BID) | ORAL | 0 refills | Status: DC

## 2019-09-30 NOTE — TOC Transition Note (Signed)
Transition of Care [facility]) - CM/SW Discharge Note   Patient Details  Name: [name] MRN: [id number] Date of Birth: 06-16-1945  Transition of Care [facility]) CM/SW Contact:  Zenon Mayo, RN Phone Number: 09/30/2019, 1:42 PM   Clinical Narrative:    NCM spoke with patient, gave her the coupon for the entresto, and informed her of the copay amt for 30 day supply estimate of 104.20.  She has no other needs.   Final next level of care: Home/Self Care Barriers to Discharge: No Barriers Identified   Patient Goals and CMS Choice        Discharge Placement                       Discharge Plan and Services                                     Social Determinants of Health (SDOH) Interventions     Readmission Risk Interventions No flowsheet data found.

## 2019-09-30 NOTE — Progress Notes (Signed)
RT Note: Patient resting comfortably on home CPAP unit.

## 2019-09-30 NOTE — Telephone Encounter (Signed)
New Message   Pt has TOC appt 10/06/19 at 2:15pm

## 2019-09-30 NOTE — Progress Notes (Signed)
ANTICOAGULATION CONSULT NOTE  Pharmacy Consult:  Coumadin Indication: Afib and MAVR  Allergies  Allergen Reactions  . Codeine Anaphylaxis    Jerking, involuntary jerking   . Beta Adrenergic Blockers     Makes her crazy  . Metoprolol     depression   . Propoxyphene Other (See Comments)    Bad vivid dreams  . Toprol Xl [Metoprolol Tartrate]     depression  . Dilantin [Phenytoin Sodium Extended] Rash and Itching    Patient Measurements: Height: 5\' 7"  (170.2 cm) Weight: 97.6 kg (215 lb 1.6 oz) (scale c) IBW/kg (Calculated) : 61.6  Vital Signs: Temp: 97.8 F (36.6 C) (06/23 0748) Temp Source: Oral (06/23 0426) BP: 106/68 (06/23 0748) Pulse Rate: 79 (06/23 0748)  Labs: Recent Labs    09/28/19 0543 09/29/19 0629 09/30/19 0515  HGB 12.7  --  13.0  HCT 40.5  --  40.6  PLT 198  --  222  LABPROT 27.2* 23.7* 23.4*  INR 2.6* 2.2* 2.2*  CREATININE 0.87 0.84  --     Estimated Creatinine Clearance: 70.5 mL/min (by C-G formula based on SCr of 0.84 mg/dL).  Assessment: Patient is a 74 yo female that presented to the ED with SOB.  Patient has a history of Afib and mechanical AVR on Coumadin 4mg  PO daily except 2mg  on Wed/Sun PTA per [facility] clinic note.   INR therapeutic at 2.2 today; no bleeding reported.   Goal of Therapy:  INR 2 - 2.5 d/t history of SDH Monitor platelets by anticoagulation protocol: Yes   Plan:  Coumadin 3mg  PO today.  If discharging, give dose prior to discharge, then resume home regimen starting 6/24. Daily PT / INR   Zayleigh Stroh D. Mina Marble, PharmD, BCPS, [place] 09/30/2019, 8:14 AM

## 2019-09-30 NOTE — Progress Notes (Signed)
Progress Note  Patient Name: [name] Date of Encounter: 09/30/2019  [facility] Cardiologist: Buford Dresser, MD   Subjective   Feeling much better. Ready to go home. Weight steady at 215 lb. Slight net negative on current diuretic dose. AF 70-80s. INR 2.2.  Inpatient Medications    Scheduled Meds: . carvedilol  3.125 mg Oral BID WC  . furosemide  40 mg Oral BID  . levothyroxine  100 mcg Oral Q0600  . polyethylene glycol  17 g Oral Daily  . potassium chloride  40 mEq Oral BID  . pravastatin  20 mg Oral Daily  . sacubitril-valsartan  1 tablet Oral BID  . sertraline  100 mg Oral Daily  . sodium chloride flush  3 mL Intravenous Q12H  . warfarin  3 mg Oral ONCE-1600  . Warfarin - Pharmacist Dosing Inpatient   Does not apply q1600   Continuous Infusions: . sodium chloride     PRN Meds: sodium chloride, acetaminophen, ondansetron (ZOFRAN) IV, sodium chloride flush   Vital Signs    Vitals:   09/29/19 1931 09/30/19 0028 09/30/19 0426 09/30/19 0748  BP: 90/62 98/60 104/69 106/68  Pulse: 91 86 72 79  Resp: 20 20 18 18   Temp: 98 F (36.7 C) (!) 97.4 F (36.3 C) (!) 97.4 F (36.3 C) 97.8 F (36.6 C)  TempSrc: Oral Oral Oral   SpO2: 92% 97% 97% 94%  Weight:   97.6 kg   Height:        Intake/Output Summary (Last 24 hours) at 09/30/2019 0948 Last data filed at 09/30/2019 0756 Gross per 24 hour  Intake 1260 ml  Output 1400 ml  Net -140 ml   Last 3 Weights 09/30/2019 09/29/2019 09/28/2019  Weight (lbs) 215 lb 1.6 oz 214 lb 12.8 oz 219 lb 4.8 oz  Weight (kg) 97.569 kg 97.433 kg 99.474 kg      Telemetry    AF, rate controlled - Personally Reviewed  ECG    No new tracing - Personally Reviewed  Physical Exam  Comfortable lying down flat GEN: No acute distress.   Neck: 4 cm JVD Cardiac: RRR, crisp mechanical valve clicks, barely audible apical murmur, rubs, or gallops.  Respiratory: Clear to auscultation bilaterally. GI: Soft, nontender,  non-distended  MS: No edema; No deformity. Neuro:  Nonfocal  Psych: Normal affect   Labs    High Sensitivity Troponin:   Recent Labs  Lab 09/24/19 1742 09/24/19 1856  TROPONINIHS 26* 30*      Chemistry Recent Labs  Lab 09/24/19 1742 09/25/19 0545 09/27/19 0651 09/28/19 0543 09/29/19 0629  NA 139   < > 141 145 143  K 3.4*   < > 3.0* 4.0 3.4*  CL 102   < > 97* 101 100  CO2 26   < > 34* 38* 34*  GLUCOSE 111*   < > 108* 98 91  BUN 12   < > 13 15 19   CREATININE 0.89   < > 0.63 0.87 0.84  CALCIUM 8.8*   < > 8.6* 9.0 8.7*  PROT 6.8  --   --   --   --   ALBUMIN 3.6  --   --   --   --   AST 24  --   --   --   --   ALT 20  --   --   --   --   ALKPHOS 157*  --   --   --   --  BILITOT 1.4*  --   --   --   --   GFRNONAA >60   < > >60 >60 >60  GFRAA >60   < > >60 >60 >60  ANIONGAP 11   < > 10 6 9    < > = values in this interval not displayed.     Hematology Recent Labs  Lab 09/26/19 0519 09/28/19 0543 09/30/19 0515  WBC 6.2 4.4 4.2  RBC 3.86* 4.12 4.22  HGB 12.0 12.7 13.0  HCT 37.7 40.5 40.6  MCV 97.7 98.3 96.2  MCH 31.1 30.8 30.8  MCHC 31.8 31.4 32.0  RDW 16.9* 16.4* 16.1*  PLT 165 198 222    BNP Recent Labs  Lab 09/24/19 1742  BNP 696.5*     DDimer No results for input(s): DDIMER in the last 168 hours.   Radiology    No results found.  Cardiac Studies   TTE 09/17/19 1. Left ventricular ejection fraction, by estimation, is 30 to 35%. The  left ventricle has moderately decreased function. The left ventricle  demonstrates global hypokinesis. The left ventricular internal cavity size  was mildly dilated. There is moderate  asymmetric left ventricular hypertrophy. Left ventricular diastolic  function could not be evaluated.  2. Right ventricular systolic function is moderately reduced. The right  ventricular size is moderately enlarged. There is severely elevated  pulmonary artery systolic pressure.  3. Left atrial size was severely dilated.    4. Right atrial size was severely dilated.  5. The mitral valve is grossly normal. Severe mitral valve regurgitation.  No evidence of mitral stenosis.  6. Tricuspid valve regurgitation is severe.  7. The aortic valve has been repaired/replaced. Aortic valve  regurgitation is not visualized. No aortic stenosis is present.  8. Aortic dilatation noted. 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  9. The inferior vena cava is dilated in size with <50% respiratory  variability, suggesting right atrial pressure of 15 mmHg.    Patient Profile     74 y.o. 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 Nonischemic nuclear test, new severe MR and TR, admitted with acute HF exacerbation.  Assessment & Plan    1. UMP:NTIRW of decline in EF uncertain. No angina/CAD history (normal cath 2012) and "normal" perfusion on very recent nuclear scan. MR has been moderate on previous echos, now severe (secondary?). For now focus on aggressive treatment of HF with meds, diuresis, good rate control.  - Entresto started 6/19. Blood pressure running a little low. Will not increase the dose for now. - tolerating carvedilol so far - On PO diuretics. New target weight< 215 lb - discussed sodium restricted diet and weight monitoring in detail - OK for DC today 2. MR: central jet, presumably secondary to CHF. TEE 2015 with moderate MR. Very unimpressive findings by physical exam. Reevaluate after full diuresis and HF compensation, if still severe consider MitraClip. 3. AFib:tolerating carvedilol, good rate control. On warfarin for mechanical AVR. Failed maintenance of SR on dofetilide and amiodarone. Severe biatrial dilation. Likely a permanent arrhythmia. 4. S/P mechanical ERX:VQMGQQ prosthesis function. 5. PY:PPJKDTO improved on  exam, likely functional; markedly dilated RV, history of OSA and morbid obesity, now with superimposed left heart systolic failure. Encourage CPAP use.     [facility] will sign off.   Medication Recommendations:   - carvedilol  3.125 mg twice daily - entresto 24/26 mg twice daily - furosemide 40 mg twice daily (AM and early PM) - KCl 40 mEq twice daily - warfarin per INR Other recommendations (labs, testing, etc):   - daily weight; keep log and bring to appt - call office if weight increases by >3lb overnight, by 5lb in one week or if it ever exceeds 220 lb. - sodium restricted diet - 100% compliance w CPAP - BMET in 1-2 weeks at [facility] visit - echo for MR in 1 month Follow up as an outpatient:  1-2 weeks TOC visit with APP, 1 month F/U (after echo) w MD  For questions or updates, please contact [place] Please consult [email or web address] for contact info under        Signed, Sanda Klein, MD  09/30/2019, 9:48 AM

## 2019-09-30 NOTE — Discharge Summary (Signed)
Physician Discharge Summary  CASHAE WEICH VOJ:[id number] DOB: [birth year] DOA: 09/24/2019  PCP: Maurice Small, MD  Admit date: 09/24/2019 Discharge date: 09/30/2019  Admitted From: Home Discharge disposition: Home   Code Status: Full Code  Diet Recommendation: Cardiac diet   Recommendations for Outpatient Follow-Up:   1. Follow-up with PCP as an outpatient 2. Follow-up with cardiology as an outpatient  Discharge Diagnosis:   Principal Problem:   Acute respiratory failure with hypoxia ([place]) Active Problems:   Essential hypertension   H/O mechanical aortic valve replacement   Obstructive sleep apnea   Hyperlipidemia LDL goal <70   Permanent atrial fibrillation (HCC)   History of subdural hematoma   Hypothyroidism   Acute on chronic combined systolic and diastolic CHF (congestive heart failure) (HCC)   CHF exacerbation ([place])  History of Present Illness / Brief narrative:  Meredith Philbrick Lowdermilkis a 74 y.o.femalewith PMH significant forHFrEF (EF decreased at 30-35% by TTE 09/17/2019), AS s/p mechanical AVR, permanent A. fib, chronic anticoagulation with Coumadin (goal INR 2-2.5 due to prior history ofSDH), hypertension, hyperlipidemia, ascending aortic aneurysm, and OSA on CPAP. Patient presented to the ED on 6/17 with progressively worsening shortness of breath, orthopnea, dyspnea on exertion, bilateral pedal edema for last several days. She says she has been taking her Lasix daily and reports good urine output.   In the ED, O2 saturation was 88-89% on room air, improved to 96% on 2 L. Blood pressure 128/84, heart rate 104. Labs are notable for sodium 139, potassium 3.4, bicarb 26, BUN 12, creatinine 0.89, WBC 9.4, hemoglobin 12.9, platelets 172,000, lactic acid 1.0, 1.2, high-sensitivity troponin I 26 >> 30, INR 2.5, BNP 696.5, lipase 16. SARS-CoV-2 PCR is negative. Portable chest x-ray showed prior sternotomy changes with marked central vascular congestion/pulmonary  edema.  Patient was given Lasix 40 mg IV once. Patient was admitted under hospitalist service for further evaluation and management.  Hospital Course:  Acute respiratory failure with hypoxia  Acute on chronic combined systolic and diastolic CHF Essential hypertension -Presented with progressively worsening worsening shortness of breath, fluid retention. -CXR with pulmonary edema. BNP 696.5.  -recent TTE on 09/17/2019 showed reduction in EF to 30-35% with global hypokinesis of LV, severely dilated atria, severely elevated pulmonary artery systolic pressure and TR regurgitation. -Home meds include Lasix 80 mg daily, Cardizem 240 mg daily, losartan 100 mg daily. -In the hospital, patient has been diuresed with IV Lasix.  19 pounds weight loss, 8 L off net diuresis. -Cardiology consult appreciated. -Patient will be discharged home today on Coreg 3.125 mg twice daily, Entresto 24/26 mg twice daily, Lasix 40 mg twice daily with potassium. -Advised on low-sodium diet with fluid restriction  Elevated troponin Hyperlipidemia -high-sensitivity troponin I 26 >> 30 -Likely demand ischemia secondary to CHF. -Continue statin and Coumadin.  Permanent atrial fibrillation ASs/p AVR with [employer] mechanical prosthesis History of subdural hematoma with current goal INR 2.0-2.5: -Remains in atrial fibrillation, rate controlled with Coreg.  Cardizem stopped.  Failed dofetilide and amiodarone in the past. -Continue Coumadin per pharmacy  Chronic hypokalemia: -On daily potassium supplement.  Hypothyroidism: Continue Synthroid  OSA: Continue CPAP nightly.  Mobility: Evaluation Code Status:  Code Status: Full Code per chart  Stable for discharge to home today.  Subjective:  Seen and examined this morning. Sitting up in chair.  Not on oxygen supplementation.  Discharge Exam:   Vitals:   09/30/19 0028 09/30/19 0426 09/30/19 0748 09/30/19 1143  BP: 98/60 104/69 106/68 116/78  Pulse:  86 72 79 67  Resp: 20 18 18 18   Temp: (!) 97.4 F (36.3 C) (!) 97.4 F (36.3 C) 97.8 F (36.6 C) 98.4 F (36.9 C)  TempSrc: Oral Oral  Oral  SpO2: 97% 97% 94% 94%  Weight:  97.6 kg    Height:        Body mass index is 33.69 kg/m.  General exam: Appears calm and comfortable.  Skin: No rashes, lesions or ulcers. HEENT: Atraumatic, normocephalic, supple neck, no obvious bleeding Lungs: Clear to auscultation bilaterally CVS: Rate controlled A. fib, no murmur GI/Abd soft, nontender, nondistended, bowel sound present CNS: Alert, awake, oriented x3 Psychiatry: Mood appropriate Extremities: No pedal edema, no calf tenderness  Discharge Instructions:  Wound care: None    Discharge Instructions    Diet - low sodium heart healthy   Complete by: As directed    Increase activity slowly   Complete by: As directed    Leave dressing on - Keep it clean, dry, and intact until clinic visit   Complete by: As directed       Follow-up Information    Deberah Pelton, NP Follow up on 10/06/2019.   Specialty: Cardiology Why: @ 2:15 PM Contact information: [address] [place] [address] [phone]        Maurice Small, MD Follow up.   Specialty: Family Medicine Contact information: [place] [address] [phone]        Buford Dresser, MD .   Specialty: Cardiology Contact information: [address] [place] [address] [phone]              Allergies as of 09/30/2019      Reactions   Codeine Anaphylaxis   Jerking, involuntary jerking   Beta Adrenergic Blockers    Makes her crazy   Metoprolol    depression   Propoxyphene Other (See Comments)   Bad vivid dreams   Toprol Xl [metoprolol Tartrate]    depression   Dilantin [phenytoin Sodium Extended] Rash, Itching      Medication List    STOP taking these medications   diltiazem 240 MG 24 hr capsule Commonly known as: Dilt-XR    losartan 100 MG tablet Commonly known as: COZAAR     TAKE these medications   acetaminophen 500 MG tablet Commonly known as: TYLENOL Take 1,000 mg by mouth daily as needed for mild pain.   ALPRAZolam 0.5 MG tablet Commonly known as: XANAX Take 0.5 mg by mouth 2 (two) times daily as needed for anxiety.   calcium-vitamin D 500-200 MG-UNIT tablet Commonly known as: OSCAL WITH D Take 2 tablets by mouth daily.   carvedilol 3.125 MG tablet Commonly known as: COREG Take 1 tablet (3.125 mg total) by mouth 2 (two) times daily with a meal.   ferrous sulfate 325 (65 FE) MG tablet Take 325 mg by mouth daily with breakfast.   furosemide 40 MG tablet Commonly known as: LASIX Take 1 tablet (40 mg total) by mouth 2 (two) times daily. What changed:   medication strength  how much to take  when to take this  additional instructions   levothyroxine 100 MCG tablet Commonly known as: SYNTHROID Take 100 mcg daily before breakfast by mouth.   multivitamin with minerals Tabs tablet Take 1 tablet by mouth daily.   mupirocin ointment 2 % Commonly known as: BACTROBAN Apply 1 application topically as directed.   oxymetazoline 0.05 % nasal spray Commonly known as:  AFRIN Place 1 spray into both nostrils daily as needed for congestion.   potassium chloride SA 20 MEQ tablet Commonly known as: KLOR-CON TAKE 2 TABLETS BY MOUTH EVERY MORNING AND 1 TABLET EVERY EVENING What changed: See the new instructions.   pravastatin 20 MG tablet Commonly known as: PRAVACHOL TAKE 1 TABLET BY MOUTH EVERY DAY   sacubitril-valsartan 24-26 MG Commonly known as: ENTRESTO Take 1 tablet by mouth 2 (two) times daily.   sertraline 100 MG tablet Commonly known as: ZOLOFT Take 100 mg by mouth daily.   tretinoin 0.05 % cream Commonly known as: RETIN-A Apply 1 application topically at bedtime.   Vitamin D 50 MCG (2000 UT) tablet Take 4,000 Units by mouth daily.   warfarin 4 MG tablet Commonly known  as: COUMADIN Take as directed. If you are unsure how to take this medication, talk to your nurse or doctor. Original instructions: TAKE 1/2 TO 1 TABLET BY MOUTH DAILY AS DIRECTED BY COUMADIN CLINIC What changed:   how much to take  how to take this  when to take this  additional instructions   zolpidem 10 MG tablet Commonly known as: AMBIEN Take 5 mg by mouth at bedtime as needed for sleep.            Discharge Care Instructions  (From admission, onward)         Start     Ordered   09/30/19 0000  Leave dressing on - Keep it clean, dry, and intact until clinic visit        09/30/19 1152          Time coordinating discharge: 35 minutes  The results of significant diagnostics from this hospitalization (including imaging, microbiology, ancillary and laboratory) are listed below for reference.    Procedures and Diagnostic Studies:   DG Chest Portable 1 View  Result Date: 09/24/2019 CLINICAL DATA:  Fever, cough, shortness of breath and history of CHF EXAM: PORTABLE CHEST 1 VIEW COMPARISON:  Radiograph 08/04/2019 FINDINGS: Lung apices are partially obscured by the patient's chin and neck soft tissues. Lung volumes are low with some atelectatic changes towards the bases. There is marked central vascular congestion with hazy, indistinct pulmonary vascularity. Perihilar and basilar predominant interstitial opacities are present with peripheral septal lines. In the more focal bandlike opacity in the left mid lung could reflect further septal thickening, fissural thickening or atelectatic change. No pneumothorax or visible effusion. Cardiomegaly similar to comparisons with postsurgical changes from prior sternotomy and likely CABG with aortic valve replacement. Telemetry leads overlie the chest. No acute osseous or soft tissue abnormality. Degenerative changes are present in the imaged spine and shoulders. IMPRESSION: 1. Features most suggestive of CHF/volume overload with  cardiomegaly, pulmonary edema. 2. Superimposed infection is not fully excluded in the appropriate clinical context. Electronically Signed   By: Lovena Le M.D.   On: 09/24/2019 17:33     Labs:   Basic Metabolic Panel: Recent Labs  Lab 09/25/19 0545 09/25/19 0545 09/26/19 0519 09/26/19 0519 09/27/19 6195 09/27/19 0932 09/28/19 0543 09/28/19 0543 09/29/19 0629 09/30/19 0515  NA 140   < > 142  --  141  --  145  --  143 140  K 3.1*   < > 3.4*   < > 3.0*   < > 4.0   < > 3.4* 4.1  CL 101   < > 100  --  97*  --  101  --  100 100  CO2 29   < >  33*  --  34*  --  38*  --  34* 28  GLUCOSE 105*   < > 103*  --  108*  --  98  --  91 78  BUN 12   < > 13  --  13  --  15  --  19 18  CREATININE 0.75   < > 0.79  --  0.63  --  0.87  --  0.84 0.79  CALCIUM 8.4*   < > 8.6*  --  8.6*  --  9.0  --  8.7* 8.6*  MG 1.8  --   --   --   --   --   --   --   --   --    < > = values in this interval not displayed.   GFR Estimated Creatinine Clearance: 74 mL/min (by C-G formula based on SCr of 0.79 mg/dL). Liver Function Tests: Recent Labs  Lab 09/24/19 1742  AST 24  ALT 20  ALKPHOS 157*  BILITOT 1.4*  PROT 6.8  ALBUMIN 3.6   Recent Labs  Lab 09/24/19 1742  LIPASE 16   No results for input(s): AMMONIA in the last 168 hours. Coagulation profile Recent Labs  Lab 09/26/19 0519 09/27/19 0651 09/28/19 0543 09/29/19 0629 09/30/19 0515  INR 2.3* 2.4* 2.6* 2.2* 2.2*    CBC: Recent Labs  Lab 09/24/19 1742 09/25/19 0545 09/26/19 0519 09/28/19 0543 09/30/19 0515  WBC 9.4 7.7 6.2 4.4 4.2  NEUTROABS 7.5  --   --   --   --   HGB 12.9 12.2 12.0 12.7 13.0  HCT 39.7 38.0 37.7 40.5 40.6  MCV 95.9 97.4 97.7 98.3 96.2  PLT 172 171 165 198 222   Cardiac Enzymes: No results for input(s): CKTOTAL, CKMB, CKMBINDEX, TROPONINI in the last 168 hours. BNP: Invalid input(s): POCBNP CBG: No results for input(s): GLUCAP in the last 168 hours. D-Dimer No results for input(s): DDIMER in the last 72  hours. Hgb A1c No results for input(s): HGBA1C in the last 72 hours. Lipid Profile No results for input(s): CHOL, HDL, LDLCALC, TRIG, CHOLHDL, LDLDIRECT in the last 72 hours. Thyroid function studies No results for input(s): TSH, T4TOTAL, T3FREE, THYROIDAB in the last 72 hours.  Invalid input(s): FREET3 Anemia work up No results for input(s): VITAMINB12, FOLATE, FERRITIN, TIBC, IRON, RETICCTPCT in the last 72 hours. Microbiology Recent Results (from the past 240 hour(s))  Urine culture     Status: None   Collection Time: 09/24/19  5:26 PM   Specimen: Urine, Random  Result Value Ref Range Status   Specimen Description URINE, RANDOM  Final   Special Requests NONE  Final   Culture   Final    NO GROWTH Performed at [facility], [address] [place], [place] [address]    Report Status 09/25/2019 FINAL  Final  Blood culture (routine x 2)     Status: None   Collection Time: 09/24/19  5:42 PM   Specimen: BLOOD  Result Value Ref Range Status   Specimen Description BLOOD RIGHT ANTECUBITAL  Final   Special Requests   Final    BOTTLES DRAWN AEROBIC AND ANAEROBIC Blood Culture adequate volume   Culture   Final    NO GROWTH 5 DAYS Performed at [facility], [place] [address] [place], [place] [address]    Report Status 09/29/2019 FINAL  Final  SARS Coronavirus 2 by RT PCR (hospital order, performed in [facility] hospital lab)  Nasopharyngeal Nasopharyngeal Swab     Status: None   Collection Time: 09/24/19  5:44 PM   Specimen: Nasopharyngeal Swab  Result Value Ref Range Status   SARS Coronavirus 2 NEGATIVE NEGATIVE Final    Comment: (NOTE) SARS-CoV-2 target nucleic acids are NOT DETECTED.  The SARS-CoV-2 RNA is generally detectable in upper and lower respiratory specimens during the acute phase of infection. The lowest concentration of SARS-CoV-2 viral copies this assay can detect is 250 copies / mL. A negative result does not preclude SARS-CoV-2 infection and should not  be used as the sole basis for treatment or other patient management decisions.  A negative result may occur with improper specimen collection / handling, submission of specimen other than nasopharyngeal swab, presence of viral mutation(s) within the areas targeted by this assay, and inadequate number of viral copies (<250 copies / mL). A negative result must be combined with clinical observations, patient history, and epidemiological information.  Fact Sheet for Patients:   StrictlyIdeas.no  Fact Sheet for Healthcare Providers: BankingDealers.co.za  This test is not yet approved or  cleared by the [place] FDA and has been authorized for detection and/or diagnosis of SARS-CoV-2 by FDA under an Emergency Use Authorization (EUA).  This EUA will remain in effect (meaning this test can be used) for the duration of the COVID-19 declaration under Section 564(b)(1) of the Act, 21 U.S.C. section 360bbb-3(b)(1), unless the authorization is terminated or revoked sooner.  Performed at [facility], [place] [address], [place], [place] [address]   Blood culture (routine x 2)     Status: None   Collection Time: 09/24/19  5:44 PM   Specimen: BLOOD RIGHT HAND  Result Value Ref Range Status   Specimen Description BLOOD RIGHT HAND  Final   Special Requests   Final    BOTTLES DRAWN AEROBIC AND ANAEROBIC Blood Culture results may not be optimal due to an inadequate volume of blood received in culture bottles   Culture   Final    NO GROWTH 5 DAYS Performed at [facility], [place] [address], [place], [place] [address]    Report Status 09/29/2019 FINAL  Final    Please note: You were cared for by a hospitalist during your hospital stay. Once you are discharged, your primary care physician will handle any further medical issues. Please note that NO REFILLS for any discharge medications will be authorized once you are discharged, as it is  imperative that you return to your primary care physician (or establish a relationship with a primary care physician if you do not have one) for your post hospital discharge needs so that they can reassess your need for medications and monitor your lab values.  Signed: Marlowe Aschoff Liesa Tsan  [facility] 09/30/2019, 11:53 AM

## 2019-10-01 NOTE — Telephone Encounter (Signed)
TOC- attempt x 1. Left message to call back. 

## 2019-10-01 NOTE — Telephone Encounter (Signed)
Patient contacted regarding discharge from [facility] on 09/30/19.  Patient understands to follow up with provider Coletta Memos on 10/06/19 at 2:15 pm at [facility]. Patient understands discharge instructions? yes Patient understands medications and regiment? yes Patient understands to bring all medications to this visit? yes

## 2019-10-03 NOTE — Progress Notes (Signed)
Cardiology Clinic Note   Patient Name: [name] Date of Encounter: 10/06/2019  Primary Care Provider:  Maurice Small, MD Primary Cardiologist:  Buford Dresser, MD  Patient Profile    [name] 74 year old female presents today for an evaluation of her permanent atrial fibrillation, essential hypertension, and chronic diastolic CHF.  Past Medical History    Past Medical History:  Diagnosis Date  . Anxiety   . Aortic stenosis    status post aortic valve replacement with St. Jude mechanical prosthesis  . 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   40mm by echo 06/2016 and 55mm by CT angin 2016  . Chronic anticoagulation   . 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 Depression   . DJD (degenerative joint disease) of knee   . Dyslipidemia   . GERD (gastroesophageal reflux disease)    "several years ago; none since" (11/12/2012)  . Hyperlipidemia   . Hyperlipidemia LDL goal <70 01/19/2017  . Hypertension   . Left atrial enlargement   . Mitral regurgitation 06/25/2016   Mild moderate MR by echo 06/2016  . Obesity   . Persistent atrial fibrillation (HCC)    s/p afib ablation x 2 at [facility]  . Sleep apnea    On CPAP at 12cm H2O  . 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 BURR HOLE FOR SUBDURAL HEMATOMA  2006  . CARDIAC VALVE REPLACEMENT  2006   St. Jude AVR  . CARDIOVERSION N/A 07/22/2012   Procedure: CARDIOVERSION;  Surgeon: [name], MD;  Location: [facility] ENDOSCOPY;  Service: Cardiovascular;  Laterality: N/A;  . CARDIOVERSION N/A 11/25/2012   Procedure: CARDIOVERSION;  Surgeon: [name], MD;  Location: [facility] ENDOSCOPY;  Service: Cardiovascular;  Laterality: N/A;  . CARDIOVERSION N/A 12/30/2012   Procedure: CARDIOVERSION;  Surgeon: [name], MD;  Location: [facility] ENDOSCOPY;   Service: Cardiovascular;  Laterality: N/A;  h&p in file-HW   . CARDIOVERSION N/A 03/30/2013   Procedure: CARDIOVERSION;  Surgeon: [name], MD;  Location: [facility] ENDOSCOPY;  Service: Cardiovascular;  Laterality: N/A;  . ELECTROPHYSIOLOGIC STUDY  11/2013   Afib ablation x 2 (11/2013 and 10/2015) at [facility] by Dr Clyda Hurdle with recurrence post ablation  . KNEE ARTHROSCOPY Left 1980's   "2" (11/12/2012)  . KNEE SURGERY Left 1980's   "after 2 scopes they went in and did some kind of OR" (11/12/2012)  . TEE WITHOUT CARDIOVERSION N/A 07/22/2012   Procedure: TRANSESOPHAGEAL ECHOCARDIOGRAM (TEE);  Surgeon: [name], MD;  Location: [facility] ENDOSCOPY;  Service: Cardiovascular;  Laterality: N/A;  Rm 2034  . TEE WITHOUT CARDIOVERSION N/A 10/07/2013   Procedure: TRANSESOPHAGEAL ECHOCARDIOGRAM (TEE);  Surgeon: [name], MD;  Location: [facility] ENDOSCOPY;  Service: Cardiovascular;  Laterality: N/A;  . TOTAL KNEE ARTHROPLASTY Left 11/05/2014   Procedure: TOTAL KNEE ARTHROPLASTY;  Surgeon: Dorna Leitz, MD;  Location: [place];  Service: Orthopedics;  Laterality: Left;  . TUBAL LIGATION  1980's    Allergies  Allergies  Allergen Reactions  . Codeine Anaphylaxis    Jerking, involuntary jerking   . Beta Adrenergic Blockers     Makes her crazy  . Metoprolol     depression   . Propoxyphene Other (See Comments)    Bad vivid dreams  . Toprol Xl [Metoprolol Tartrate]     depression  .  Dilantin [Phenytoin Sodium Extended] Rash and Itching    History of Present Illness    Ms. [name] has a PMH of HFrEF (EF 30-35% via TEE 09/17/2019), aortic stenosis status post mechanical AVR, permanent atrial fibrillation, chronic anticoagulation with Coumadin, HTN, HLD, AAA, and OSA on CPAP.  Failed DCCV, A. fib ablation at [facility] but did not maintain, repeat ablation 7/17 at [facility], placed on amiodarone.  Paroxysmal A. fib until around 2020.  10/27/2018 monitor suggested 100% A. fib burden although heart rate was larger than regular.  EKG  11/04/2018 felt to be sinus with long first-degree block given regularity.  ECG 3/21 irregular with no clear P wave suggesting permanent atrial fibrillation.  She was last seen by Dr. Harrell Gave on 09/14/2019.  Her Lexiscan was normal and echocardiogram was also scheduled.  She continued with shortness of breath that was progressing.  Exaggerated with exertion.  She complained of fatigue with normal daily activities.  She was seen and evaluated by Dr. Melvyn Novas on 08/31/2019.  During that time she was told that she could follow-up as needed and was doing well.  She was admitted to the hospital on 09/24/2019 with acute hypoxic respiratory failure.  She was discharged on 09/30/2019.  She presented to the ER on 09/24/2019 with progressive shortness of breath, orthopnea, dyspnea on exertion, bilateral pedal edema for several days.  She has been compliant with her Lasix and continued to have good urine output.  O2 saturation on admission 88 to 89% on room air which improved to 96% on 2 L.  Blood pressure was 128/84 with a heart rate of 104.  High-sensitivity troponins were 26 and 30.  Her BNP was 696.5.  She was negative for Covid via PCR.  Her CXR showed vascular congestion/pulmonary edema.  She received IV diuresis and was admitted to the hospital.  She continued with IV diuresis and lost 19 pounds.  She was discharged on carvedilol 3.125 and Entresto 24/26.  She presents the clinic today for follow-up evaluation and states she has felt great since leaving the hospital.  Her weight continues to decrease and she is 204 today.  Her discharge weight was 215.  She does state that she has some fatigue today.  Her blood pressure is 86/50.  I have asked her about her diet and she says she has drastically reduced the sodium content and changed her food options.  I have asked her to slightly increase her sodium content in her diet and continue to monitor her blood pressures.  I will decrease her Lasix to 40 mg daily, have her wear  self daily and take an extra dose of Lasix with a 3 pound overnight or 5 pound in 1 week weight increase.  We will also reduce her potassium from 60 mEq to 40 mEq.  She will continue her physical activity, keep a blood pressure log, and follow-up with Dr. Harrell Gave as scheduled.  She also has a sleep study scheduled for repeat evaluation.  Today she denies chest pain, shortness of breath, lower extremity edema, palpitations, melena, hematuria, hemoptysis, diaphoresis, weakness, presyncope, syncope, orthopnea, and PND.  Home Medications    Prior to Admission medications   Medication Sig Start Date End Date Taking? Authorizing Provider  acetaminophen (TYLENOL) 500 MG tablet Take 1,000 mg by mouth daily as needed for mild pain.    [provider]  ALPRAZolam Duanne Moron) 0.5 MG tablet Take 0.5 mg by mouth 2 (two) times daily as needed for anxiety.  01/11/17   [provider]  calcium-vitamin D (OSCAL WITH D) 500-200 MG-UNIT per tablet Take 2 tablets by mouth daily.     [provider]  carvedilol (COREG) 3.125 MG tablet Take 1 tablet (3.125 mg total) by mouth 2 (two) times daily with a meal. 09/30/19 10/30/19  Dahal, Marlowe Aschoff, MD  Cholecalciferol (VITAMIN D) 2000 UNITS tablet Take 4,000 Units by mouth daily.    [provider]  ferrous sulfate 325 (65 FE) MG tablet Take 325 mg by mouth daily with breakfast.    [provider]  furosemide (LASIX) 40 MG tablet Take 1 tablet (40 mg total) by mouth 2 (two) times daily. 09/30/19 10/30/19  Terrilee Croak, MD  levothyroxine (SYNTHROID, LEVOTHROID) 100 MCG tablet Take 100 mcg daily before breakfast by mouth.    [provider]  Multiple Vitamin (MULTIVITAMIN WITH MINERALS) TABS Take 1 tablet by mouth daily.    [provider]  mupirocin ointment (BACTROBAN) 2 % Apply 1 application topically as directed. 09/17/19   [provider]  oxymetazoline (AFRIN) 0.05 % nasal spray Place 1 spray into both  nostrils daily as needed for congestion.    [provider]  potassium chloride SA (KLOR-CON) 20 MEQ tablet TAKE 2 TABLETS BY MOUTH EVERY MORNING AND 1 TABLET EVERY EVENING Patient taking differently: Take 20-40 mEq by mouth See admin instructions. Take 40mg  in the morning and take 20mg  in the evening. 01/13/19   Buford Dresser, MD  pravastatin (PRAVACHOL) 20 MG tablet TAKE 1 TABLET BY MOUTH EVERY DAY Patient taking differently: Take 20 mg by mouth daily.  09/16/18   Buford Dresser, MD  sacubitril-valsartan (ENTRESTO) 24-26 MG Take 1 tablet by mouth 2 (two) times daily. 09/30/19 10/30/19  Terrilee Croak, MD  sertraline (ZOLOFT) 100 MG tablet Take 100 mg by mouth daily. 07/09/19   [provider]  tretinoin (RETIN-A) 0.05 % cream Apply 1 application topically at bedtime.     [provider]  warfarin (COUMADIN) 4 MG tablet TAKE 1/2 TO 1 TABLET BY MOUTH DAILY AS DIRECTED BY COUMADIN CLINIC Patient taking differently: Take 2-4 mg by mouth See admin instructions. Take 2.5mg  on Sunday and Wednesday and take 4mg  on all other days. 08/17/19   Buford Dresser, MD  zolpidem (AMBIEN) 10 MG tablet Take 5 mg by mouth at bedtime as needed for sleep.    [provider]    Family History    Family History  Problem Relation Age of Onset  . Lung cancer Mother   . Hypertension Mother   . Hyperlipidemia Father   . Hypertension Father   . Heart disease Brother    She indicated that her mother is deceased. She indicated that her father is deceased. She indicated that her brother is alive. She indicated that her maternal grandmother is deceased. She indicated that her maternal grandfather is deceased. She indicated that her paternal grandmother is deceased. She indicated that her paternal grandfather is deceased.  Social History    Social History   Socioeconomic History  . Marital status: Married    Spouse name: Not on file  . Number of children: Not on  file  . Years of education: Not on file  . Highest education level: Not on file  Occupational History  . Not on file  Tobacco Use  . Smoking status: Never Smoker  . Smokeless tobacco: Never Used  Substance and Sexual Activity  . Alcohol use: No    Comment: 11/12/2012 "no alcohol in the last 9 years"  .  Drug use: No  . Sexual activity: Not Currently  Other Topics Concern  . Not on file  Social History Narrative   Pt lives in [place] with spouse.  Retired   Scientist, physiological Strain:   . Difficulty of Paying Living Expenses:   Food Insecurity:   . Worried About Charity fundraiser in the Last Year:   . Arboriculturist in the Last Year:   Transportation Needs:   . Film/video editor (Medical):   Marland Kitchen Lack of Transportation (Non-Medical):   Physical Activity: Inactive  . Days of Exercise per Week: 0 days  . Minutes of Exercise per Session: 0 min  Stress:   . Feeling of Stress :   Social Connections: Unknown  . Frequency of Communication with Friends and Family: Once a week  . Frequency of Social Gatherings with Friends and Family: Not on file  . Attends Religious Services: 1 to 4 times per year  . Active Member of Clubs or Organizations: No  . Attends Archivist Meetings: Never  . Marital Status: Married  Human resources officer Violence:   . Fear of Current or Ex-Partner:   . Emotionally Abused:   Marland Kitchen Physically Abused:   . Sexually Abused:      Review of Systems    General:  No chills, fever, night sweats or weight changes.  Cardiovascular:  No chest pain, dyspnea on exertion, edema, orthopnea, palpitations, paroxysmal nocturnal dyspnea. Dermatological: No rash, lesions/masses Respiratory: No cough, dyspnea Urologic: No hematuria, dysuria Abdominal:   No nausea, vomiting, diarrhea, bright red blood per rectum, melena, or hematemesis Neurologic:  No visual changes, wkns, changes in mental status. All other systems reviewed and  are otherwise negative except as noted above.  Physical Exam    VS:  BP (!) 86/50 (BP Location: Left Arm, Patient Position: Sitting, Cuff Size: Large)   Pulse 83   Temp (!) 96.8 F (36 C)   Ht 5\' 7"  (1.702 m)   Wt 204 lb (92.5 kg)   BMI 31.95 kg/m  , BMI Body mass index is 31.95 kg/m. GEN: Well nourished, well developed, in no acute distress. HEENT: normal. Neck: Supple, no JVD, carotid bruits, or masses. Cardiac: RRR, no murmurs, rubs, or gallops. No clubbing, cyanosis, edema.  Radials/DP/PT 2+ and equal bilaterally.  Respiratory:  Respirations regular and unlabored, clear to auscultation bilaterally. GI: Soft, nontender, nondistended, BS + x 4. MS: no deformity or atrophy. Skin: warm and dry, no rash. Neuro:  Strength and sensation are intact. Psych: Normal affect.  Accessory Clinical Findings    ECG personally reviewed by me today-none today.  EKG 09/14/2019 Atrial fibrillation 88 bpm  Echocardiogram 11/12/2018 The left ventricle has low normal systolic function, with an ejection fraction of 50-55%. The cavity size was normal. Left ventricular diastolic function could not be evaluated 2. The right ventricle has moderately reduced systolic function. The cavity was mildly enlarged. There is no increase in right ventricular wall thickness. 3. Left atrial size was severely dilated. 4. Right atrial size was severely dilated. 5. The mitral valve is grossly normal. There is moderate mitral annular calcification present. 6. The tricuspid valve is grossly normal. Tricuspid valve regurgitation is severe. 7. The observed AV gradient is likely normal for this mechanical valve. 8. The aorta is abnormal in size and structure. 9. There is moderate dilatation of the ascending aorta. 10. The inferior vena cava was dilated in size with <50% respiratory variability. 11.  The interatrial septum was not assessed.  Echocardiogram 09/17/2019 IMPRESSIONS    1. Left ventricular ejection  fraction, by estimation, is 30 to 35%. The  left ventricle has moderately decreased function. The left ventricle  demonstrates global hypokinesis. The left ventricular internal cavity size  was mildly dilated. There is moderate  asymmetric left ventricular hypertrophy. Left ventricular diastolic  function could not be evaluated.  2. Right ventricular systolic function is moderately reduced. The right  ventricular size is moderately enlarged. There is severely elevated  pulmonary artery systolic pressure.  3. Left atrial size was severely dilated.  4. Right atrial size was severely dilated.  5. The mitral valve is grossly normal. Severe mitral valve regurgitation.  No evidence of mitral stenosis.  6. Tricuspid valve regurgitation is severe.  7. The aortic valve has been repaired/replaced. Aortic valve  regurgitation is not visualized. No aortic stenosis is present.  8. Aortic dilatation noted. 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  9. The inferior vena cava is dilated in size with <50% respiratory  variability, suggesting right atrial pressure of 15 mmHg.  Nuclear stress test 08/25/2019  There was no ST segment deviation noted during stress.  This is a low risk study.  Patient is in atrial fibrillation with irregular RR interval. Ejection fraction and volumetric assessment unable to be accurately obtained.  No ischemia or infarction noted on perfusion images. Overall, low risk study.  Assessment & Plan   1.  Chronic diastolic CHF-breathing much better today.  Echocardiogram 09/17/2019 showed a reduction in ejection fraction to 30-35%, RV function moderately reduced, left and right atria severely dilated, severe mitral valve regurgitation, severe tricuspid valve regurgitation, aortic valve repaired/replaced, moderate to severe aortic valve dilation 49 mm Continue carvedilol, furosemide, potassium, Decrease furosemide to 40 mg  daily Decrease potassium to 40 mEq daily Contine Entresto 24/26 -plan to increase at or around 10/28/2019. Heart healthy low-sodium diet-salty 6 given Increase physical activity as tolerated Daily weights Lower extremity support stockings May take an extra dose of furosemide for a weight increase of 3 pounds overnight or 5 pounds in 1 week. Plan for repeat echocardiogram after medications have been optimized for 1 month. Order BMP at follow-up visit.  Permanent atrial fibrillation-heart rate today 83.  Has been increasing her physical activity. Continue warfarin, carvedilol Heart healthy low-sodium diet-salty 6 given Increase physical activity as tolerated  Essential hypertension-BP today 86/50.  Well-controlled at home Continue carvedilol, furosemide, Entresto Heart healthy low-sodium diet-salty 6 given Increase physical activity as tolerated  Hyperlipidemia-LDL 84 on 05/27/2018 Continue pravastatin Heart healthy low-sodium diet-salty 6 given Increase physical activity as tolerated  Disposition: With me or Dr. Harrell Gave in as scheduled  Jossie Ng. Aamir Mclinden NP-C    10/06/2019, 2:49 PM [place] [address] Office [phone] Fax [phone]

## 2019-10-05 NOTE — Telephone Encounter (Signed)
Waiting for Dr. Ander Slade to be in the office to sign report.

## 2019-10-06 ENCOUNTER — Encounter: Payer: Self-pay | Admitting: General Practice

## 2019-10-06 ENCOUNTER — Other Ambulatory Visit: Payer: Self-pay

## 2019-10-06 ENCOUNTER — Ambulatory Visit (INDEPENDENT_AMBULATORY_CARE_PROVIDER_SITE_OTHER): Payer: Medicare Other | Admitting: General Practice

## 2019-10-06 VITALS — BP 86/50 | HR 83 | Temp 96.8°F | Ht 67.0 in | Wt 204.0 lb

## 2019-10-06 DIAGNOSIS — I10 Essential (primary) hypertension: Secondary | ICD-10-CM

## 2019-10-06 DIAGNOSIS — I5032 Chronic diastolic (congestive) heart failure: Secondary | ICD-10-CM | POA: Diagnosis not present

## 2019-10-06 DIAGNOSIS — I4821 Permanent atrial fibrillation: Secondary | ICD-10-CM | POA: Diagnosis not present

## 2019-10-06 DIAGNOSIS — E785 Hyperlipidemia, unspecified: Secondary | ICD-10-CM

## 2019-10-06 MED ORDER — FUROSEMIDE 40 MG PO TABS: 40.0000 mg | ORAL_TABLET | Freq: Every day | ORAL | 0 refills | Status: DC

## 2019-10-06 MED ORDER — POTASSIUM CHLORIDE CRYS ER 20 MEQ PO TBCR: 40.0000 meq | EXTENDED_RELEASE_TABLET | Freq: Every day | ORAL | 3 refills | Status: DC

## 2019-10-06 NOTE — Patient Instructions (Signed)
Medication Instructions:  DECREASE LASIX 40MG  DAILY-MAY TAKE EXTRA FOR WEIGHT GAIN 3lbs/DAY -OR- 5lbs/WEEKLY *If you need a refill on your cardiac medications before your next appointment, please call your pharmacy*  Special Instructions PLEASE TAKE AND LOG YOUR WEIGHT AND BLOOD PRESSULRE DAILY  PLEASE INCREASE SALT IN YOURDIET-SLIGHTLY   Follow-Up: Your next appointment:  KEEP SCHEDULED APPOINTMENTIn Person with Buford Dresser, MD  At [facility], you and your health needs are our priority.  As part of our continuing mission to provide you with exceptional heart care, we have created designated Provider Care Teams.  These Care Teams include your primary Cardiologist (physician) and Advanced Practice Providers (APPs -  Physician Assistants and Nurse Practitioners) who all work together to provide you with the care you need, when you need it.

## 2019-10-08 ENCOUNTER — Encounter (HOSPITAL_BASED_OUTPATIENT_CLINIC_OR_DEPARTMENT_OTHER): Payer: Self-pay

## 2019-10-09 DIAGNOSIS — Z09 Encounter for follow-up examination after completed treatment for conditions other than malignant neoplasm: Secondary | ICD-10-CM | POA: Diagnosis not present

## 2019-10-09 DIAGNOSIS — Z5181 Encounter for therapeutic drug level monitoring: Secondary | ICD-10-CM | POA: Diagnosis not present

## 2019-10-09 DIAGNOSIS — R0600 Dyspnea, unspecified: Secondary | ICD-10-CM | POA: Diagnosis not present

## 2019-10-09 DIAGNOSIS — I509 Heart failure, unspecified: Secondary | ICD-10-CM | POA: Diagnosis not present

## 2019-10-13 ENCOUNTER — Other Ambulatory Visit: Payer: Self-pay

## 2019-10-13 ENCOUNTER — Ambulatory Visit (INDEPENDENT_AMBULATORY_CARE_PROVIDER_SITE_OTHER): Payer: Medicare Other | Admitting: Pharmacist

## 2019-10-13 ENCOUNTER — Other Ambulatory Visit (HOSPITAL_COMMUNITY): Payer: Medicare Other

## 2019-10-13 ENCOUNTER — Telehealth: Payer: Self-pay | Admitting: Pulmonary Disease

## 2019-10-13 DIAGNOSIS — Z7901 Long term (current) use of anticoagulants: Secondary | ICD-10-CM | POA: Diagnosis not present

## 2019-10-13 DIAGNOSIS — I4819 Other persistent atrial fibrillation: Secondary | ICD-10-CM

## 2019-10-13 DIAGNOSIS — I359 Nonrheumatic aortic valve disorder, unspecified: Secondary | ICD-10-CM | POA: Diagnosis not present

## 2019-10-13 DIAGNOSIS — Z5181 Encounter for therapeutic drug level monitoring: Secondary | ICD-10-CM

## 2019-10-13 DIAGNOSIS — I480 Paroxysmal atrial fibrillation: Secondary | ICD-10-CM | POA: Diagnosis not present

## 2019-10-13 LAB — POCT INR: INR: 2.4 (ref 2.0–3.0)

## 2019-10-13 NOTE — Telephone Encounter (Signed)
The order appears to be a split night study.  Dr. Ander Slade, please advise on CPAP settings and liter flow of oxygen if any.  The study is today.  Thank you.

## 2019-10-13 NOTE — Telephone Encounter (Signed)
AO is here today. Shyerl please make sure he signs report.

## 2019-10-13 NOTE — Patient Instructions (Signed)
Description   Continue with 1 tablet daily except 1/2 tablet each Sunday and Wednesday . Recheck INR in 4 weeks.  Coumadin Clinic [phone]

## 2019-10-14 ENCOUNTER — Encounter (HOSPITAL_BASED_OUTPATIENT_CLINIC_OR_DEPARTMENT_OTHER): Payer: Medicare Other | Admitting: Pulmonary Disease

## 2019-10-14 ENCOUNTER — Ambulatory Visit (HOSPITAL_BASED_OUTPATIENT_CLINIC_OR_DEPARTMENT_OTHER): Payer: Medicare Other | Attending: Pulmonary Disease | Admitting: Pulmonary Disease

## 2019-10-14 DIAGNOSIS — Z79899 Other long term (current) drug therapy: Secondary | ICD-10-CM | POA: Insufficient documentation

## 2019-10-14 DIAGNOSIS — Z7901 Long term (current) use of anticoagulants: Secondary | ICD-10-CM | POA: Insufficient documentation

## 2019-10-14 DIAGNOSIS — G4733 Obstructive sleep apnea (adult) (pediatric): Secondary | ICD-10-CM | POA: Diagnosis not present

## 2019-10-14 DIAGNOSIS — I493 Ventricular premature depolarization: Secondary | ICD-10-CM | POA: Diagnosis not present

## 2019-10-14 NOTE — Telephone Encounter (Signed)
Called and spoke with Lynnae Sandhoff from [facility] letting him know the info stated by AO. Lynnae Sandhoff verbalized understanding. Nothing further needed.

## 2019-10-14 NOTE — Telephone Encounter (Signed)
Patient requires a CPAP titration with oxygen supplementation if needed  Study is to add oxygen supplementation to CPAP if needed.  Titration should be done per protocol-CPAP titration can be started at 12 which is what patient's current pressure is

## 2019-10-14 NOTE — Telephone Encounter (Signed)
[name] calling back to get clarification on the order for sleep study appointment tonight. [name] can be reached at [phone]

## 2019-10-20 ENCOUNTER — Other Ambulatory Visit: Payer: Self-pay

## 2019-10-20 ENCOUNTER — Ambulatory Visit (HOSPITAL_COMMUNITY): Payer: Medicare Other | Attending: Cardiology

## 2019-10-20 ENCOUNTER — Telehealth: Payer: Self-pay | Admitting: Pulmonary Disease

## 2019-10-20 DIAGNOSIS — I340 Nonrheumatic mitral (valve) insufficiency: Secondary | ICD-10-CM | POA: Diagnosis not present

## 2019-10-20 DIAGNOSIS — G4733 Obstructive sleep apnea (adult) (pediatric): Secondary | ICD-10-CM | POA: Diagnosis not present

## 2019-10-20 NOTE — Telephone Encounter (Signed)
Call patient  Sleep study result  Date of study: 7 10/14/2019   Impression: Severe obstructive sleep apnea adequately treated with CPAP therapy No significant oxygen desaturations at optimal pressure  Recommendation: CPAP therapy on 17 cm of water with a small size ResMed fullface mask AirFit F 30 mask and heated humidification.  No oxygen supplementation required  Follow-up as previously scheduled-4 to 6 weeks after set up of CPAP

## 2019-10-20 NOTE — Procedures (Signed)
POLYSOMNOGRAPHY  Last, First: Meredith, [name] MRN: [id number] Gender: Female Age (years): 76 Weight (lbs): 199 DOB: Feb 09, 1946 BMI: 31 Primary Care: No PCP Epworth Score: <br> Referring: Laurin Coder MD Technician: Carolin Coy Interpreting: Laurin Coder MD Study Type: CPAP Ordered Study Type: CPAP Study date: 10/14/2019 Location: [place] CLINICAL INFORMATION [name] is a 74 year old Female and was referred to the sleep center for evaluation of 327.23 - Obstructive Sleep Apnea. Indications include Atrial Fibrillation, CHF, Congestive Heart Failure, Established OSA, Hypertension (401.9), Obesity, Weight Gain.   Most recent titration study dated 02/21/2014 was optimal at 12cm H2O with an AHI of 5.7/h. MEDICATIONS Patient self administered medications include: COREG, KLOR-CON, ENTRESTO, COUMADIN, ZOLPIDEM TARTRATE. Medications administered during study include Sleep medicine administered - ZOLPIDEM 5 MG at 09:13:00 PM  SLEEP STUDY TECHNIQUE The patient underwent an attended overnight polysomnography titration to assess the effects of CPAP therapy. The following variables were monitored: EEG(C4-A1, C3-A2, O1-A2, O2-A1), EOG, submental and leg EMG, ECG, oxyhemoglobin saturation by pulse oximetry, thoracic and abdominal respiratory effort belts, nasal/oral airflow by pressure sensor, body position sensor and snoring sensor. CPAP pressure was titrated to eliminate apneas, hypopneas and oxygen desaturation. Hypopneas were scored per AASM definition IB (4% desaturation)  TECHNICIAN COMMENTS Comments added by Technician: PATIENT WAS ORDERED AS A CPAP TITRATION AND PER PHYSICIAN ORDER PATIENT WAS TO START ON A CPAP OF 12 CMH2O. Comments added by Scorer: N/A SLEEP ARCHITECTURE The study was initiated at 10:42:42 PM and terminated at 4:46:10 AM. Total recorded time was 363.5 minutes. EEG confirmed total sleep time was 316.5 minutes yielding a sleep efficiency of 87.1%%.  Sleep onset after lights out was 12.9 minutes with a REM latency of 186.0 minutes. The patient spent 6.6%% of the night in stage N1 sleep, 70.3%% in stage N2 sleep, 0.0%% in stage N3 and 23.1% in REM. The Arousal Index was 27.3/hour. RESPIRATORY PARAMETERS The overall AHI was 1.5 per hour, and the RDI was 5.7 events/hour with a central apnea index of 0.0per hour. The most appropriate setting of CPAP was 18 cm H2O. At this setting, the sleep efficiency was 99% and the patient was supine for 100%. The AHI was 0.0 events per hour, and the RDI was 1.2 events/hour (with 0.0 central events) and the arousal index was 9.3 per hour.The oxygen nadir was 91.0% during sleep.    The cumulative time under 88% oxygen saturation was 5.5 minutes  LEG MOVEMENT DATA The total leg movements were 76 with a resulting leg movement index of 14.4/hr. Associated arousal with leg movement index was 4.0/hr. CARDIAC DATA The underlying cardiac rhythm was most consistent with atrial fibrillation. Mean heart rate during sleep was 72.3 bpm. Additional rhythm abnormalities include PVCs.   IMPRESSIONS - Electrocardiographic data showed presence of PVCs. - The patient snored with moderate snoring volume. - No Significant Obstructive Sleep apnea(OSA) Optimal pressure attained. - No Significant Central Sleep Apnea (CSA) - No Significant Upper Airway Resistance Syndrome(UARS). - No significant periodic leg movements(PLMs) during sleep, no significant associated arousals. - Normal sleep efficiency, normal primary sleep latency, long REM sleep latency and no slow wave latency.   DIAGNOSIS - Severe obstructive Sleep Apnea (G47.33) - No significant oxygen desaturation at optimal pressure   RECOMMENDATIONS - Trial of CPAP therapy on 17 cm H2O with a Small size Resmed Full Face Mask AirFit F30 mask and heated humidification. - Avoid alcohol, sedatives and other CNS depressants that may worsen sleep apnea and disrupt normal sleep  architecture. -  Sleep hygiene should be reviewed to assess factors that may improve sleep quality. - Weight management and regular exercise should be initiated or continued. - Return to Sleep Center for re-evaluation after 4 weeks of therapy  [Electronically signed] 10/20/2019 04:38 AM  Sherrilyn Rist MD NPI: [id number]

## 2019-10-20 NOTE — Telephone Encounter (Signed)
Called and left message for patient to return call.  

## 2019-10-21 NOTE — Telephone Encounter (Signed)
Patient contacted with results from [place]. DME order placed to [facility] in [place].

## 2019-10-27 ENCOUNTER — Encounter: Payer: Self-pay | Admitting: Cardiology

## 2019-10-27 ENCOUNTER — Ambulatory Visit (INDEPENDENT_AMBULATORY_CARE_PROVIDER_SITE_OTHER): Payer: Medicare Other | Admitting: Cardiology

## 2019-10-27 ENCOUNTER — Other Ambulatory Visit: Payer: Self-pay

## 2019-10-27 VITALS — BP 118/78 | HR 79 | Temp 97.1°F | Ht 67.0 in | Wt 207.0 lb

## 2019-10-27 DIAGNOSIS — I5042 Chronic combined systolic (congestive) and diastolic (congestive) heart failure: Secondary | ICD-10-CM | POA: Diagnosis not present

## 2019-10-27 DIAGNOSIS — I4821 Permanent atrial fibrillation: Secondary | ICD-10-CM | POA: Diagnosis not present

## 2019-10-27 DIAGNOSIS — I10 Essential (primary) hypertension: Secondary | ICD-10-CM | POA: Diagnosis not present

## 2019-10-27 DIAGNOSIS — Z79899 Other long term (current) drug therapy: Secondary | ICD-10-CM | POA: Diagnosis not present

## 2019-10-27 DIAGNOSIS — I071 Rheumatic tricuspid insufficiency: Secondary | ICD-10-CM

## 2019-10-27 DIAGNOSIS — Z952 Presence of prosthetic heart valve: Secondary | ICD-10-CM

## 2019-10-27 NOTE — Patient Instructions (Addendum)
Medication Instructions:  Your physician recommends that you continue on your current medications as directed. Please refer to the Current Medication list given to you today.  *If you need a refill on your cardiac medications before your next appointment, please call your pharmacy*   Lab Work: BMET today   If you have labs (blood work) drawn today and your tests are completely normal, you will receive your results only by:  [place] (if you have MyChart) OR  A paper copy in the mail If you have any lab test that is abnormal or we need to change your treatment, we will call you to review the results.   Testing/Procedures: NONE   Follow-Up: At [facility], you and your health needs are our priority.  As part of our continuing mission to provide you with exceptional heart care, we have created designated Provider Care Teams.  These Care Teams include your primary Cardiologist (physician) and Advanced Practice Providers (APPs -  Physician Assistants and Nurse Practitioners) who all work together to provide you with the care you need, when you need it.  We recommend signing up for the patient portal called "MyChart".  Sign up information is provided on this After Visit Summary.  MyChart is used to connect with patients for Virtual Visits (Telemedicine).  Patients are able to view lab/test results, encounter notes, upcoming appointments, etc.  Non-urgent messages can be sent to your provider as well.   To learn more about what you can do with MyChart, go to [email or web address].    Your next appointment:   2 month(s)  The format for your next appointment:   In Person  Provider:   You may see Buford Dresser, MD or one of the following Advanced Practice Providers on your designated Care Team:    Rosaria Ferries, PA-C  Jory Sims, DNP, ANP  Cadence Kathlen Mody, PA-C    Other Instructions  Dr. Harrell Gave has requested that you schedule an appointment with one  of our clinical pharmacists for a blood pressure check appointment first available.  If you monitor your blood pressure (BP) at home, please bring your BP cuff and your BP readings with you to this appointment  HOW TO TAKE YOUR BLOOD PRESSURE:  Rest 5 minutes before taking your blood pressure.  Dont smoke or drink caffeinated beverages for at least 30 minutes before.  Take your blood pressure before (not after) you eat.  Sit comfortably with your back supported and both feet on the floor (dont cross your legs).  Elevate your arm to heart level on a table or a desk.  Use the proper sized cuff. It should fit smoothly and snugly around your bare upper arm. There should be enough room to slip a fingertip under the cuff. The bottom edge of the cuff should be 1 inch above the crease of the elbow.  Ideally, take 3 measurements at one sitting and record the average.

## 2019-10-27 NOTE — Progress Notes (Signed)
Cardiology Office Note:    Date:  10/27/2019   ID:  [name], DOB February 23, 1946, MRN [id number]  PCP:  Maurice Small, MD  Cardiologist:  Buford Dresser, MD PhD  Referring MD: Maurice Small, MD   CC: follow up  History of Present Illness:    [name] is a 74 y.o. female with a hx ofhx of morbid obesity, mechanical AVR in 2006 by Dr. Prescott Gum, atrial fibrillation (see hx below), OSA on CPAPwho is seen for follow up.  History of atrial fibrillation: trialed on tikosyn with DCCV, did not maintain NSR. Amiodarone loaded, then DCCV, but did not maintain NSR, Had afib ablation at [facility], did not maintain, and then had repeat ablation 10/2015 at [facility], placed on amiodarone. Was in paroxysmal atrial fibrillation until around 2020. Monitor 10/2018 suggestive of 100% afib burden, though heart rate was largely regular. ECG in office 11/04/18 done and reviewed, thought to be sinus with long first degree block given the regularity. However, ECG done 06/2019 irregular and without clear P waves, suggestive of atrial fibrillation now being permanent.   Today: Has felt much better since leaving the hospital. Doing well on entresto and carvedilol. Got a sleep number bed, sleeping very well.   Blood pressure is excellent today. At home, numbers have been from 80-110/60-70s. Sees 80s-90s frequently at home but feels fine. No fatigue, no dizziness/lightheadedness. Last visit was 86/50.   Breathing has been excellent. Has been working around the house, only time she felt short of breath was climbing up and down a ladder.  Denies chest pain, shortness of breath at rest or with normal exertion. No PND, orthopnea, LE edema or unexpected weight gain. No syncope or palpitations.  Weighs every morning, weight range at home 197-200 lbs, has been steady. Prior to hospital was 234 lbs. Taking lasix 40 mg BID.  No issues with bleeding, follows with coumadin clinic, has done well.  Past Medical History:    Diagnosis Date   Anxiety    Aortic stenosis    status post aortic valve replacement with St. 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   Depression    DJD (degenerative joint disease) of knee    Dyslipidemia    GERD (gastroesophageal reflux disease)    "several years ago; none since" (11/12/2012)   Hyperlipidemia    Hyperlipidemia LDL goal <70 01/19/2017   Hypertension    Left atrial enlargement    Mitral regurgitation 06/25/2016   Mild moderate MR by echo 06/2016   Obesity    Persistent atrial fibrillation (HCC)    s/p afib ablation x 2 at [facility]   Sleep apnea    On CPAP at 12cm H2O   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 Jude AVR   CARDIOVERSION N/A 07/22/2012   Procedure: CARDIOVERSION;  Surgeon: [name], MD;  Location: [facility] ENDOSCOPY;  Service: Cardiovascular;  Laterality: N/A;   CARDIOVERSION N/A 11/25/2012   Procedure: CARDIOVERSION;  Surgeon: [name], MD;  Location: [place];  Service: Cardiovascular;  Laterality: N/A;   CARDIOVERSION N/A 12/30/2012   Procedure: CARDIOVERSION;  Surgeon: [name], MD;  Location: [place];  Service: Cardiovascular;  Laterality: N/A;  h&p in file-HW    CARDIOVERSION N/A 03/30/2013   Procedure: CARDIOVERSION;  Surgeon: [name], MD;  Location: [place];  Service: Cardiovascular;  Laterality: N/A;   ELECTROPHYSIOLOGIC STUDY  11/2013   Afib ablation x 2 (11/2013 and 10/2015) at [facility] by Dr Clyda Hurdle with recurrence post ablation   KNEE ARTHROSCOPY Left 1980's   "2" (11/12/2012)   KNEE SURGERY  Left 1980's   "after 2 scopes they went in and did some kind of OR" (11/12/2012)   TEE WITHOUT CARDIOVERSION N/A 07/22/2012   Procedure: TRANSESOPHAGEAL ECHOCARDIOGRAM (TEE);  Surgeon: [name], MD;  Location: [facility] ENDOSCOPY;  Service: Cardiovascular;  Laterality: N/A;  Rm 2034   TEE WITHOUT CARDIOVERSION N/A 10/07/2013   Procedure: TRANSESOPHAGEAL ECHOCARDIOGRAM (TEE);  Surgeon: [name], MD;  Location: [facility] ENDOSCOPY;  Service: Cardiovascular;  Laterality: N/A;   TOTAL KNEE ARTHROPLASTY Left 11/05/2014   Procedure: TOTAL KNEE ARTHROPLASTY;  Surgeon: Dorna Leitz, MD;  Location: [place];  Service: Orthopedics;  Laterality: Left;   TUBAL LIGATION  1980's    Current Medications: Current Outpatient Medications on File Prior to Visit  Medication Sig   acetaminophen (TYLENOL) 500 MG tablet Take 1,000 mg by mouth daily as needed for mild pain.   ALPRAZolam (XANAX) 0.5 MG tablet Take 0.5 mg by mouth 2 (two) times daily as needed for anxiety.    calcium-vitamin D (OSCAL WITH D) 500-200 MG-UNIT per tablet Take 2 tablets by mouth daily.    carvedilol (COREG) 3.125 MG tablet Take 1 tablet (3.125 mg total) by mouth 2 (two) times daily with a meal.   Cholecalciferol (VITAMIN D) 2000 UNITS tablet Take 4,000 Units by mouth daily.   ferrous sulfate 325 (65 FE) MG tablet Take 325 mg by mouth daily with breakfast.   furosemide (LASIX) 40 MG tablet Take 1 tablet (40 mg total) by mouth daily. Ok to take extra for wt gain>3#/day or 5#/weekly   levothyroxine (SYNTHROID, LEVOTHROID) 100 MCG tablet Take 100 mcg daily before breakfast by mouth.   Multiple Vitamin (MULTIVITAMIN WITH MINERALS) TABS Take 1 tablet by mouth daily.   mupirocin ointment (BACTROBAN) 2 % Apply 1 application topically as directed.   oxymetazoline (AFRIN) 0.05 % nasal spray Place 1 spray into both nostrils daily as needed for congestion.   potassium chloride SA (KLOR-CON) 20 MEQ tablet Take 2 tablets (40 mEq total) by mouth daily.    pravastatin (PRAVACHOL) 20 MG tablet TAKE 1 TABLET BY MOUTH EVERY DAY (Patient taking differently: Take 20 mg by mouth daily. )   sacubitril-valsartan (ENTRESTO) 24-26 MG Take 1 tablet by mouth 2 (two) times daily.   sertraline (ZOLOFT) 100 MG tablet Take 100 mg by mouth daily.   tretinoin (RETIN-A) 0.05 % cream Apply 1 application topically at bedtime.    warfarin (COUMADIN) 4 MG tablet TAKE 1/2 TO 1 TABLET BY MOUTH DAILY AS DIRECTED BY COUMADIN CLINIC (Patient taking differently: Take 2-4 mg by mouth See admin instructions. Take 2.5mg  on Sunday and Wednesday and take 4mg  on all other days.)   zolpidem (AMBIEN) 10 MG tablet Take 5 mg by mouth at bedtime as needed for sleep.   No current facility-administered medications on file prior to visit.     Allergies:   Codeine, Beta adrenergic blockers, Metoprolol, Propoxyphene, Toprol xl [metoprolol tartrate], and Dilantin [phenytoin sodium extended]  Social History   Tobacco Use   Smoking status: Never Smoker   Smokeless tobacco: Never Used  Substance Use Topics   Alcohol use: No    Comment: 11/12/2012 "no alcohol in the last 9 years"   Drug use: No    Family History: The patient's family history includes Heart disease in her brother; Hyperlipidemia in her father; Hypertension in her father and mother; Lung cancer in her mother.  ROS:   Please see the history of present illness.  Additional pertinent ROS otherwise unremarkable except as noted in HPI.    EKGs/Labs/Other Studies Reviewed:    The following studies were reviewed today: Echo 10/20/19 1. Left ventricular ejection fraction, by estimation, is 30 to 35%. The  left ventricle has moderately decreased function. The left ventricle has  no regional wall motion abnormalities. The left ventricular internal  cavity size was mildly dilated. Left  ventricular diastolic parameters are indeterminate.  2. Right ventricular systolic function is normal. The right ventricular   size is moderately enlarged. There is moderately elevated pulmonary artery  systolic pressure. The estimated right ventricular systolic pressure is  61.6 mmHg.  3. Left atrial size was severely dilated.  4. Right atrial size was severely dilated.  5. The mitral valve is degenerative. Severe mitral valve regurgitation.  No evidence of mitral stenosis.  6. Tricuspid valve regurgitation is severe.  7. The aortic valve has been repaired/replaced. Aortic valve  regurgitation is not visualized. No aortic stenosis is present. There is a  21 mm St. Jude Reagent Mechanical valve present in the aortic position.  Procedure Date: 2006. Echo findings are  consistent with normal structure and function of the aortic valve  prosthesis. Aortic valve mean gradient measures 7.4 mmHg. Aortic valve  Vmax measures 1.86 m/s.  8. Aortic dilatation noted. There is moderate dilatation of the ascending  aorta measuring 48 mm.  9. The inferior vena cava is dilated in size with <50% respiratory  variability, suggesting right atrial pressure of 15 mmHg.   Echo 09/17/19 1. Left ventricular ejection fraction, by estimation, is 30 to 35%. The  left ventricle has moderately decreased function. The left ventricle  demonstrates global hypokinesis. The left ventricular internal cavity size  was mildly dilated. There is moderate  asymmetric left ventricular hypertrophy. Left ventricular diastolic  function could not be evaluated.  2. Right ventricular systolic function is moderately reduced. The right  ventricular size is moderately enlarged. There is severely elevated  pulmonary artery systolic pressure.  3. Left atrial size was severely dilated.  4. Right atrial size was severely dilated.  5. The mitral valve is grossly normal. Severe mitral valve regurgitation.  No evidence of mitral stenosis.  6. Tricuspid valve regurgitation is severe.  7. The aortic valve has been repaired/replaced. Aortic valve   regurgitation is not visualized. No aortic stenosis is present.  8. Aortic dilatation noted. 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  9. The inferior vena cava is dilated in size with <50% respiratory  variability, suggesting right atrial pressure of 15 mmHg.   Lexiscan nuclear stress test 08/25/19:  There was no ST segment deviation noted during stress.  This is a low risk study.   Patient is in atrial fibrillation with irregular RR interval.  Ejection fraction and volumetric assessment unable to be accurately obtained.  No ischemia or infarction noted on perfusion images.  Overall, low risk study.  echo 11/12/18:  1. The left ventricle has low  normal systolic function, with an ejection fraction of 50-55%. The cavity size was normal. Left ventricular diastolic function could not be evaluated  2. The right ventricle has moderately reduced systolic function. The cavity was mildly enlarged. There is no increase in right ventricular wall thickness.  3. Left atrial size was severely dilated.  4. Right atrial size was severely dilated.  5. The mitral valve is grossly normal. There is moderate mitral annular calcification present.  6. The tricuspid valve is grossly normal. Tricuspid valve regurgitation is severe.  7. The observed AV gradient is likely normal for this mechanical valve.  8. The aorta is abnormal in size and structure.  9. There is moderate dilatation of the ascending aorta. 10. The inferior vena cava was dilated in size with <50% respiratory variability. 11. The interatrial septum was not assessed.  Monitor results, prior echoes also reviewed today  EKG:  EKG is personally reviewed.  The ekg ordered 08/03/19 demonstrates atrial fibrillation at 88 bpm.  Recent Labs: 04/27/2019: Pro B Natriuretic peptide (BNP) 264.0 06/08/2019: TSH 3.25 09/24/2019: ALT 20; B Natriuretic Peptide 696.5 09/25/2019: Magnesium 1.8 09/30/2019: BUN 18;  Creatinine, Ser 0.79; Hemoglobin 13.0; Platelets 222; Potassium 4.1; Sodium 140  Recent Lipid Panel    Component Value Date/Time   CHOL 175 05/27/2018 1125   TRIG 100 05/27/2018 1125   HDL 71 05/27/2018 1125   CHOLHDL 2.5 05/27/2018 1125   LDLCALC 84 05/27/2018 1125    Physical Exam:    VS:  BP 118/78    Pulse 79    Temp (!) 97.1 F (36.2 C)    Ht 5\' 7"  (1.702 m)    Wt 207 lb (93.9 kg)    SpO2 96%    BMI 32.42 kg/m     Wt Readings from Last 3 Encounters:  10/27/19 207 lb (93.9 kg)  10/14/19 199 lb (90.3 kg)  10/06/19 204 lb (92.5 kg)    GEN: Well nourished, well developed in no acute distress HEENT: Normal, moist mucous membranes NECK: No JVD CARDIAC: irregularly irregular rhythm, normal S1 and crisp metallic S2, no rubs or gallops. 2/6 systolic murmur. VASCULAR: Radial and DP pulses 2+ bilaterally. No carotid bruits RESPIRATORY:  Clear to auscultation without rales, wheezing or rhonchi  ABDOMEN: Soft, non-tender, non-distended MUSCULOSKELETAL:  Ambulates independently SKIN: Warm and dry, no edema NEUROLOGIC:  Alert and oriented x 3. No focal neuro deficits noted. PSYCHIATRIC:  Normal affect   ASSESSMENT:    1. Chronic combined systolic and diastolic congestive heart failure ([place])   2. Medication management   3. Permanent atrial fibrillation ([place])   4. H/O mechanical aortic valve replacement   5. Severe tricuspid regurgitation   6. Essential hypertension    PLAN:    Recent hospitalization for acute on chronic systolic and diastolic heart failure Recent diagnosis of systolic heart failure 08/275 Chronic diastolic heart failure, mild mitral regurgitation, severe tricuspid regurgitation:  -diuresed well, feeling much better -no clinical right heart overload on exam, has known severe TR -lexiscan nuclear stress test without ischemia -counseled on red flag warning signs that need immediate medical attention -weights stable -continue furosemide 40 mg daily with PRN for  weight gain -now on carvedilol, entresto and tolerating  Permanent atrial fibrillation: on coumadin for mechanical valve. Chadsvasc=4 -has complex treatment history, see prior notes.  -we have discussed that given her previous attempts at management, her biatrial enlargement, and the duration of her afib, that it is unlikely we will be able to get her in  sustained sinus rhythm -she is currently rate controlled on carvedilol  Mechanical aortic valve -follows with coumadin clinic -stable gradient on prior echo, no change to murmur  Hypertension: at goal today -continue carvedilol 3.125 mg BID, furosemide as above, entresto 24-26 mg BID -recheck BMET today  Stable ascending aortic aneurysm: 4.4 2018-2019.  OSA: continue CPAP  Cardiac risk counseling and prevention recommendations: -recommend heart healthy/Mediterranean diet, with whole grains, fruits, vegetable, fish, lean meats, nuts, and olive oil. Limit salt. -recommend moderate walking, 3-5 times/week for 30-50 minutes each session. Aim for at least 150 minutes.week. Goal should be pace of 3 miles/hours, or walking 1.5 miles in 30 minutes -recommend avoidance of tobacco products. Avoid excess alcohol. -continue pravastatin for elevated ASCVD risk -ASCVD risk score: The 10-year ASCVD risk score Mikey Bussing DC Brooke Bonito., et al., 2013) is: 16%   Values used to calculate the score:     Age: 89 years     Sex: Female     Is Non-Hispanic African American: No     Diabetic: No     Tobacco smoker: No     Systolic Blood Pressure: 696 mmHg     Is BP treated: Yes     HDL Cholesterol: 71 mg/dL     Total Cholesterol: 175 mg/dL    Plan for follow up: 2 mos or sooner as needed  Buford Dresser, MD, PhD [place]   [facility]   Medication Adjustments/Labs and Tests Ordered: Current medicines are reviewed at length with the patient today.  Concerns regarding medicines are outlined above.  Orders Placed This Encounter  Procedures   Basic  metabolic panel   No orders of the defined types were placed in this encounter.   Patient Instructions  Medication Instructions:  Your physician recommends that you continue on your current medications as directed. Please refer to the Current Medication list given to you today.  *If you need a refill on your cardiac medications before your next appointment, please call your pharmacy*   Lab Work: BMET today   If you have labs (blood work) drawn today and your tests are completely normal, you will receive your results only by:  [place] (if you have MyChart) OR  A paper copy in the mail If you have any lab test that is abnormal or we need to change your treatment, we will call you to review the results.   Testing/Procedures: NONE   Follow-Up: At [facility], you and your health needs are our priority.  As part of our continuing mission to provide you with exceptional heart care, we have created designated Provider Care Teams.  These Care Teams include your primary Cardiologist (physician) and Advanced Practice Providers (APPs -  Physician Assistants and Nurse Practitioners) who all work together to provide you with the care you need, when you need it.  We recommend signing up for the patient portal called "MyChart".  Sign up information is provided on this After Visit Summary.  MyChart is used to connect with patients for Virtual Visits (Telemedicine).  Patients are able to view lab/test results, encounter notes, upcoming appointments, etc.  Non-urgent messages can be sent to your provider as well.   To learn more about what you can do with MyChart, go to [email or web address].    Your next appointment:   2 month(s)  The format for your next appointment:   In Person  Provider:   You may see Buford Dresser, MD or one of the following Advanced Practice Providers on your  designated Care Team:    Rosaria Ferries, PA-C  Jory Sims, DNP, ANP  Cadence  Kathlen Mody, PA-C    Other Instructions  Dr. Harrell Gave has requested that you schedule an appointment with one of our clinical pharmacists for a blood pressure check appointment first available.  If you monitor your blood pressure (BP) at home, please bring your BP cuff and your BP readings with you to this appointment  HOW TO TAKE YOUR BLOOD PRESSURE:  Rest 5 minutes before taking your blood pressure.  Dont smoke or drink caffeinated beverages for at least 30 minutes before.  Take your blood pressure before (not after) you eat.  Sit comfortably with your back supported and both feet on the floor (dont cross your legs).  Elevate your arm to heart level on a table or a desk.  Use the proper sized cuff. It should fit smoothly and snugly around your bare upper arm. There should be enough room to slip a fingertip under the cuff. The bottom edge of the cuff should be 1 inch above the crease of the elbow.  Ideally, take 3 measurements at one sitting and record the average.   Signed, Buford Dresser, MD PhD 10/27/19 [place]

## 2019-10-28 LAB — BASIC METABOLIC PANEL
BUN/Creatinine Ratio: 24 (ref 12–28)
BUN: 22 mg/dL (ref 8–27)
CO2: 25 mmol/L (ref 20–29)
Calcium: 9.4 mg/dL (ref 8.7–10.3)
Chloride: 101 mmol/L (ref 96–106)
Creatinine, Ser: 0.9 mg/dL (ref 0.57–1.00)
GFR calc Af Amer: 73 mL/min/{1.73_m2} (ref >59)
GFR calc non Af Amer: 63 mL/min/{1.73_m2} (ref >59)
Glucose: 81 mg/dL (ref 65–99)
Potassium: 4.9 mmol/L (ref 3.5–5.2)
Sodium: 141 mmol/L (ref 134–144)

## 2019-10-29 ENCOUNTER — Other Ambulatory Visit: Payer: Self-pay | Admitting: Cardiology

## 2019-10-30 ENCOUNTER — Other Ambulatory Visit: Payer: Self-pay

## 2019-10-30 MED ORDER — SACUBITRIL-VALSARTAN 24-26 MG PO TABS: 1.0000 | ORAL_TABLET | Freq: Two times a day (BID) | ORAL | 3 refills | Status: AC

## 2019-10-30 MED ORDER — CARVEDILOL 3.125 MG PO TABS: 3.1250 mg | ORAL_TABLET | Freq: Two times a day (BID) | ORAL | 3 refills | Status: DC

## 2019-11-09 ENCOUNTER — Ambulatory Visit (INDEPENDENT_AMBULATORY_CARE_PROVIDER_SITE_OTHER): Payer: Medicare Other

## 2019-11-09 ENCOUNTER — Other Ambulatory Visit: Payer: Self-pay

## 2019-11-09 DIAGNOSIS — I4819 Other persistent atrial fibrillation: Secondary | ICD-10-CM

## 2019-11-09 DIAGNOSIS — I480 Paroxysmal atrial fibrillation: Secondary | ICD-10-CM | POA: Diagnosis not present

## 2019-11-09 DIAGNOSIS — Z5181 Encounter for therapeutic drug level monitoring: Secondary | ICD-10-CM

## 2019-11-09 LAB — POCT INR: INR: 2.2 (ref 2.0–3.0)

## 2019-11-09 NOTE — Patient Instructions (Signed)
Continue with 1 tablet daily except 1/2 tablet each Sunday and Wednesday . Recheck INR in 4 weeks.  Coumadin Clinic [phone]

## 2019-12-04 ENCOUNTER — Ambulatory Visit (INDEPENDENT_AMBULATORY_CARE_PROVIDER_SITE_OTHER): Payer: Medicare Other | Admitting: Pharmacist

## 2019-12-04 ENCOUNTER — Other Ambulatory Visit: Payer: Self-pay

## 2019-12-04 VITALS — BP 118/64 | HR 58 | Ht 64.0 in | Wt 206.8 lb

## 2019-12-04 DIAGNOSIS — I4819 Other persistent atrial fibrillation: Secondary | ICD-10-CM | POA: Diagnosis not present

## 2019-12-04 DIAGNOSIS — I480 Paroxysmal atrial fibrillation: Secondary | ICD-10-CM

## 2019-12-04 DIAGNOSIS — I10 Essential (primary) hypertension: Secondary | ICD-10-CM | POA: Diagnosis not present

## 2019-12-04 DIAGNOSIS — I5032 Chronic diastolic (congestive) heart failure: Secondary | ICD-10-CM | POA: Diagnosis not present

## 2019-12-04 DIAGNOSIS — Z5181 Encounter for therapeutic drug level monitoring: Secondary | ICD-10-CM | POA: Diagnosis not present

## 2019-12-04 LAB — POCT INR: INR: 2.3 (ref 2.0–3.0)

## 2019-12-04 NOTE — Progress Notes (Signed)
Patient ID: [name]                 DOB: [birth year]                      MRN: [id number]     HPI: [name] is a 74 y.o. female referred by Dr. Harrell Gave to HTN clinic. PMH includes hypertension, OSA on CPAP,HFrEF (EF 30-35% on 10/20/19), mitral AVR in 2006, atrial fibrillation, DJD, and hypothyroidism. Patient is currently on Entresto , carvedilol and furosemide. She is feeling the best she felt in" long time" . Denies increased fatigue, shortness of breath, swelling, weight gain, headaches or decreased appetite.  Current HTN meds:  Entresto 24-26mg  twice daily Carvedilol 3.125mg  twice daily Furosemide 40mg  twice daily plus PRN  BP goal: <130/80  Family History: patient's family history includes Heart disease in her brother; Hyperlipidemia in her father; Hypertension in her father and mother; Lung cancer in her mother.  Social History: lung cancer and hypertension in mother; hypertension, and hyperlipidemia in father; heart disease in brother.   Diet: avoids excessive salt intake  Exercise: activities of daily living, but able to walk more without getting winded.  Home BP readings:  12 readings; average 111/58 (HR 56-106 bpm) Home BP machine is accurate within 22mmHg from manual reading  Wt Readings from Last 3 Encounters:  12/04/19 206 lb 12.8 oz (93.8 kg)  10/27/19 207 lb (93.9 kg)  10/14/19 199 lb (90.3 kg)   BP Readings from Last 3 Encounters:  12/04/19 118/64  10/27/19 118/78  10/06/19 (!) 86/50   Pulse Readings from Last 3 Encounters:  12/04/19 (!) 58  10/27/19 79  10/06/19 83    Past Medical History:  Diagnosis Date  . Anxiety   . Aortic stenosis    status post aortic valve replacement with [employer] mechanical prosthesis  . 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   60mm by echo 06/2016 and 43mm by CT angin 2016  . Chronic anticoagulation   . 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 Depression   . DJD  (degenerative joint disease) of knee   . Dyslipidemia   . GERD (gastroesophageal reflux disease)    "several years ago; none since" (11/12/2012)  . Hyperlipidemia   . Hyperlipidemia LDL goal <70 01/19/2017  . Hypertension   . Left atrial enlargement   . Mitral regurgitation 06/25/2016   Mild moderate MR by echo 06/2016  . Obesity   . Persistent atrial fibrillation (HCC)    s/p afib ablation x 2 at [facility]  . Sleep apnea    On CPAP at 12cm H2O  . 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 - now cleared by neurosurgery to maintain INR 2-2.5    Current Outpatient Medications on File Prior to Visit  Medication Sig Dispense Refill  . acetaminophen (TYLENOL) 500 MG tablet Take 1,000 mg by mouth daily as needed for mild pain.    Marland Kitchen ALPRAZolam (XANAX) 0.5 MG tablet Take 0.5 mg by mouth 2 (two) times daily as needed for anxiety.   1  . calcium-vitamin D (OSCAL WITH D) 500-200 MG-UNIT per tablet Take 2 tablets by mouth daily.     . carvedilol (COREG) 3.125 MG tablet Take 1 tablet (3.125 mg total) by mouth 2 (two) times daily with a meal. 90 tablet 3  . Cholecalciferol (VITAMIN D) 2000 UNITS tablet Take 4,000  Units by mouth daily.    . ferrous sulfate 325 (65 FE) MG tablet Take 325 mg by mouth daily with breakfast.    . furosemide (LASIX) 40 MG tablet Take 1 tablet (40 mg total) by mouth daily. Ok to take extra for wt gain>3#/day or 5#/weekly 30 tablet 0  . levothyroxine (SYNTHROID, LEVOTHROID) 100 MCG tablet Take 100 mcg daily before breakfast by mouth.    . Multiple Vitamin (MULTIVITAMIN WITH MINERALS) TABS Take 1 tablet by mouth daily.    Marland Kitchen oxymetazoline (AFRIN) 0.05 % nasal spray Place 1 spray into both nostrils daily as needed for congestion.    . potassium chloride SA (KLOR-CON) 20 MEQ tablet Take 2 tablets (40 mEq total) by mouth daily. 60 tablet 3  . pravastatin (PRAVACHOL) 20 MG tablet TAKE 1 TABLET BY MOUTH EVERY DAY 90 tablet 1  . sacubitril-valsartan  (ENTRESTO) 24-26 MG Take 1 tablet by mouth 2 (two) times daily.    . sertraline (ZOLOFT) 100 MG tablet Take 100 mg by mouth daily.    Marland Kitchen tretinoin (RETIN-A) 0.05 % cream Apply 1 application topically at bedtime.     Marland Kitchen warfarin (COUMADIN) 4 MG tablet TAKE 1/2 TO 1 TABLET BY MOUTH DAILY AS DIRECTED BY COUMADIN CLINIC (Patient taking differently: Take 2-4 mg by mouth See admin instructions. Take 2.5mg  on Sunday and Wednesday and take 4mg  on all other days.) 90 tablet 1  . zolpidem (AMBIEN) 10 MG tablet Take 5 mg by mouth at bedtime as needed for sleep.     No current facility-administered medications on file prior to visit.    Allergies  Allergen Reactions  . Codeine Anaphylaxis    Jerking, involuntary jerking   . Beta Adrenergic Blockers     Makes her crazy  . Metoprolol     depression   . Propoxyphene Other (See Comments)    Bad vivid dreams  . Toprol Xl [Metoprolol Tartrate]     depression  . Dilantin [Phenytoin Sodium Extended] Rash and Itching    Blood pressure 118/64, pulse (!) 58, height 5\' 4"  (1.626 m), weight 206 lb 12.8 oz (93.8 kg), SpO2 99 %.  Essential hypertension Blood pressure and HR at desired goal. Patient denies problems with current therapy. Will continue all medication as prescribed.  Chronic diastolic CHF (congestive heart failure) Blood pressure and heart rate remain appropriate for current therapy. Not appropriate to titrate Entresto or Carvedilol any further with BP of 118/64 and HR 58bpm. Noted serum potassium at 4.9; thefore , will hold adding spironolactone to therapy for now.  Patient already has appointment with Dr Sallyanne Kuster on 9/23. Plan to repeat BMET and start low dose spironolactone if K < 4.7   Tariyah Pendry Rodriguez-Guzman PharmD, BCPS, [place] [address] [place] [address] 12/07/2019 1:53 PM

## 2019-12-04 NOTE — Patient Instructions (Addendum)
Return for a follow up appointment as needed  Check your blood pressure at home daily (if able) and keep record of the readings.  Take your BP meds as follows: *No change*  Bring all of your meds, your BP cuff and your record of home blood pressures to your next appointment.  Exercise as you're able, try to walk approximately 30 minutes per day.  Keep salt intake to a minimum, especially watch canned and prepared boxed foods.  Eat more fresh fruits and vegetables and fewer canned items.  Avoid eating in fast food restaurants.    HOW TO TAKE YOUR BLOOD PRESSURE: . Rest 5 minutes before taking your blood pressure. .  Don't smoke or drink caffeinated beverages for at least 30 minutes before. . Take your blood pressure before (not after) you eat. . Sit comfortably with your back supported and both feet on the floor (don't cross your legs). . Elevate your arm to heart level on a table or a desk. . Use the proper sized cuff. It should fit smoothly and snugly around your bare upper arm. There should be enough room to slip a fingertip under the cuff. The bottom edge of the cuff should be 1 inch above the crease of the elbow. . Ideally, take 3 measurements at one sitting and record the average.

## 2019-12-07 ENCOUNTER — Encounter: Payer: Self-pay | Admitting: Pharmacist

## 2019-12-07 NOTE — Assessment & Plan Note (Signed)
Blood pressure and heart rate remain appropriate for current therapy. Not appropriate to titrate Entresto or Carvedilol any further with BP of 118/64 and HR 58bpm. Noted serum potassium at 4.9; thefore , will hold adding spironolactone to therapy for now.  Patient already has appointment with Dr Sallyanne Kuster on 9/23. Plan to repeat BMET and start low dose spironolactone if K < 4.7

## 2019-12-07 NOTE — Assessment & Plan Note (Signed)
Blood pressure and HR at desired goal. Patient denies problems with current therapy. Will continue all medication as prescribed.

## 2019-12-08 DIAGNOSIS — I4891 Unspecified atrial fibrillation: Secondary | ICD-10-CM | POA: Diagnosis not present

## 2019-12-08 DIAGNOSIS — E039 Hypothyroidism, unspecified: Secondary | ICD-10-CM | POA: Diagnosis not present

## 2019-12-08 DIAGNOSIS — G4733 Obstructive sleep apnea (adult) (pediatric): Secondary | ICD-10-CM | POA: Diagnosis not present

## 2019-12-08 DIAGNOSIS — I712 Thoracic aortic aneurysm, without rupture: Secondary | ICD-10-CM | POA: Diagnosis not present

## 2019-12-08 DIAGNOSIS — Z0000 Encounter for general adult medical examination without abnormal findings: Secondary | ICD-10-CM | POA: Diagnosis not present

## 2019-12-08 DIAGNOSIS — F330 Major depressive disorder, recurrent, mild: Secondary | ICD-10-CM | POA: Diagnosis not present

## 2019-12-08 DIAGNOSIS — D508 Other iron deficiency anemias: Secondary | ICD-10-CM | POA: Diagnosis not present

## 2019-12-08 DIAGNOSIS — I5032 Chronic diastolic (congestive) heart failure: Secondary | ICD-10-CM | POA: Diagnosis not present

## 2019-12-08 DIAGNOSIS — Z23 Encounter for immunization: Secondary | ICD-10-CM | POA: Diagnosis not present

## 2019-12-08 DIAGNOSIS — I10 Essential (primary) hypertension: Secondary | ICD-10-CM | POA: Diagnosis not present

## 2019-12-08 DIAGNOSIS — E785 Hyperlipidemia, unspecified: Secondary | ICD-10-CM | POA: Diagnosis not present

## 2019-12-08 DIAGNOSIS — Z1211 Encounter for screening for malignant neoplasm of colon: Secondary | ICD-10-CM | POA: Diagnosis not present

## 2019-12-09 ENCOUNTER — Other Ambulatory Visit: Payer: Self-pay | Admitting: General Practice

## 2019-12-09 ENCOUNTER — Other Ambulatory Visit: Payer: Self-pay | Admitting: Cardiology

## 2019-12-09 ENCOUNTER — Other Ambulatory Visit: Payer: Self-pay

## 2019-12-28 ENCOUNTER — Telehealth: Payer: Self-pay | Admitting: *Deleted

## 2019-12-28 ENCOUNTER — Encounter: Payer: Self-pay | Admitting: Cardiology

## 2019-12-28 NOTE — Telephone Encounter (Signed)
Left a message for the patient to call back. She is currently a patient of Dr. Harrell Gave but is scheduled with Dr. Sallyanne Kuster on 9/23. Spoke with the patient and she would like to switch to Dr. Sallyanne Kuster only because she had seen him in the hospital recently and was happy with him. She stated that she felt like that may be a better fit for her.

## 2019-12-28 NOTE — Telephone Encounter (Signed)
OK with me.

## 2019-12-29 NOTE — Telephone Encounter (Signed)
Ok by me

## 2019-12-31 ENCOUNTER — Encounter: Payer: Self-pay | Admitting: Cardiovascular Disease

## 2019-12-31 ENCOUNTER — Ambulatory Visit (INDEPENDENT_AMBULATORY_CARE_PROVIDER_SITE_OTHER): Payer: Medicare Other | Admitting: Pharmacist

## 2019-12-31 ENCOUNTER — Ambulatory Visit (INDEPENDENT_AMBULATORY_CARE_PROVIDER_SITE_OTHER): Payer: Medicare Other | Admitting: Cardiovascular Disease

## 2019-12-31 ENCOUNTER — Other Ambulatory Visit: Payer: Self-pay

## 2019-12-31 VITALS — BP 124/60 | HR 61 | Ht 67.0 in | Wt 206.8 lb

## 2019-12-31 DIAGNOSIS — G4733 Obstructive sleep apnea (adult) (pediatric): Secondary | ICD-10-CM

## 2019-12-31 DIAGNOSIS — I5022 Chronic systolic (congestive) heart failure: Secondary | ICD-10-CM

## 2019-12-31 DIAGNOSIS — I4819 Other persistent atrial fibrillation: Secondary | ICD-10-CM | POA: Diagnosis not present

## 2019-12-31 DIAGNOSIS — I340 Nonrheumatic mitral (valve) insufficiency: Secondary | ICD-10-CM

## 2019-12-31 DIAGNOSIS — I4821 Permanent atrial fibrillation: Secondary | ICD-10-CM

## 2019-12-31 DIAGNOSIS — I77810 Thoracic aortic ectasia: Secondary | ICD-10-CM

## 2019-12-31 DIAGNOSIS — Z952 Presence of prosthetic heart valve: Secondary | ICD-10-CM

## 2019-12-31 DIAGNOSIS — Z5181 Encounter for therapeutic drug level monitoring: Secondary | ICD-10-CM

## 2019-12-31 DIAGNOSIS — I480 Paroxysmal atrial fibrillation: Secondary | ICD-10-CM

## 2019-12-31 LAB — POCT INR: INR: 1.7 — AB (ref 2.0–3.0)

## 2019-12-31 MED ORDER — FUROSEMIDE 40 MG PO TABS: 40.0000 mg | ORAL_TABLET | Freq: Two times a day (BID) | ORAL | Status: DC

## 2019-12-31 NOTE — Patient Instructions (Signed)
Medication Instructions:  No changes *If you need a refill on your cardiac medications before your next appointment, please call your pharmacy*   Lab Work: None ordered If you have labs (blood work) drawn today and your tests are completely normal, you will receive your results only by: MyChart Message (if you have MyChart) OR A paper copy in the mail If you have any lab test that is abnormal or we need to change your treatment, we will call you to review the results.   Testing/Procedures: Your physician has requested that you have an echocardiogram. Echocardiography is a painless test that uses sound waves to create images of your heart. It provides your doctor with information about the size and shape of your heart and how well your heart's chambers and valves are working. You may receive an ultrasound enhancing agent through an IV if needed to better visualize your heart during the echo.This procedure takes approximately one hour. There are no restrictions for this procedure. This will take place at the [address], [address].   Follow-Up: At [facility], you and your health needs are our priority.  As part of our continuing mission to provide you with exceptional heart care, we have created designated Provider Care Teams.  These Care Teams include your primary Cardiologist (physician) and Advanced Practice Providers (APPs -  Physician Assistants and Nurse Practitioners) who all work together to provide you with the care you need, when you need it.  We recommend signing up for the patient portal called "MyChart".  Sign up information is provided on this After Visit Summary.  MyChart is used to connect with patients for Virtual Visits (Telemedicine).  Patients are able to view lab/test results, encounter notes, upcoming appointments, etc.  Non-urgent messages can be sent to your provider as well.   To learn more about what you can do with MyChart, go to [email or web address].     Your next appointment:   6 month(s)  The format for your next appointment:   In Person  Provider:   Mihai Croitoru, MD    

## 2019-12-31 NOTE — Progress Notes (Signed)
Cardiology Office Note:    Date:  12/31/2019   ID:  [name], DOB Aug 11, 1945, MRN [id number]  PCP:  Maurice Small, MD  [facility] Cardiologist:  Buford Dresser, MD  [place] Electrophysiologist:  None   Referring MD: Maurice Small, MD   Chief Complaint  Patient presents with  . Congestive Heart Failure  . Irregular Heart Beat  . Cardiac Valve Problem    History of Present Illness:    [name] is a 74 y.o. 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  The cause of LVEF decline was uncertain, but she responded well to discontinuation of diltiazem and treatment with diuretics, Entresto, carvedilol.  In the past she reportedly did not tolerate beta-blockers due to depression, but the current low-dose of carvedilol seems to be working well for her.  She reports that this is the best she has felt in 2 or 3 years.  She has very infrequent problems with lower extremity edema.  She does not feel limited by shortness of breath and does not have orthopnea or PND.  She  has not had syncope, dizziness or palpitations.  She brought a detailed record of her heart rate and blood pressure at home.  Her typical ventricular rate seems to be around 60 and her blood pressure occasionally dips into the high 90s/60s.  She has been taking the furosemide twice daily and has only had to take an extra dose of furosemide for fluid gain once or twice in the last couple of months.  Past Medical History:  Diagnosis Date  . Anxiety   . Aortic stenosis    status post aortic valve replacement with [employer] mechanical prosthesis  . 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   24mm by echo 06/2016 and 35mm by CT angin 2016  . Chronic anticoagulation   . 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 Depression   . DJD (degenerative joint disease) of knee   . Dyslipidemia   . GERD (gastroesophageal reflux disease)    "several years ago; none since" (11/12/2012)  . Hyperlipidemia   . Hyperlipidemia LDL goal <70 01/19/2017  . Hypertension   . Left atrial enlargement   . Mitral regurgitation 06/25/2016   Mild moderate MR by echo 06/2016  . Obesity   . Persistent atrial fibrillation (HCC)    s/p afib ablation x 2 at [facility]  . Sleep apnea    On CPAP at 12cm H2O  . 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 - now cleared by neurosurgery to maintain INR 2-2.5    Past Surgical History:  Procedure Laterality Date  . BURR HOLE FOR SUBDURAL HEMATOMA  2006  . CARDIAC VALVE REPLACEMENT  2006   [employer] AVR  . CARDIOVERSION N/A 07/22/2012   Procedure: CARDIOVERSION;  Surgeon: [name], MD;  Location: [facility] ENDOSCOPY;  Service: Cardiovascular;  Laterality: N/A;  . CARDIOVERSION N/A 11/25/2012   Procedure: CARDIOVERSION;  Surgeon: [name], MD;  Location: [facility] ENDOSCOPY;  Service: Cardiovascular;  Laterality: N/A;  . CARDIOVERSION N/A 12/30/2012   Procedure: CARDIOVERSION;  Surgeon: [name], MD;  Location: [facility]  ENDOSCOPY;  Service: Cardiovascular;  Laterality: N/A;  h&p in file-HW   . CARDIOVERSION N/A 03/30/2013   Procedure: CARDIOVERSION;  Surgeon: [name], MD;  Location: [facility] ENDOSCOPY;  Service: Cardiovascular;  Laterality: N/A;  . ELECTROPHYSIOLOGIC STUDY  11/2013   Afib ablation x 2 (11/2013 and 10/2015) at [facility] by Dr Clyda Hurdle with recurrence post ablation  . KNEE ARTHROSCOPY Left 1980's   "2" (11/12/2012)  . KNEE SURGERY Left 1980's   "after 2 scopes they went in and did some kind of OR" (11/12/2012)  . TEE WITHOUT CARDIOVERSION N/A 07/22/2012   Procedure: TRANSESOPHAGEAL ECHOCARDIOGRAM (TEE);  Surgeon: [name], MD;  Location: [facility] ENDOSCOPY;  Service: Cardiovascular;  Laterality: N/A;  Rm 2034  . TEE WITHOUT CARDIOVERSION N/A 10/07/2013   Procedure: TRANSESOPHAGEAL ECHOCARDIOGRAM (TEE);  Surgeon: [name], MD;  Location: [facility] ENDOSCOPY;  Service: Cardiovascular;  Laterality: N/A;  . TOTAL KNEE ARTHROPLASTY Left 11/05/2014   Procedure: TOTAL KNEE ARTHROPLASTY;  Surgeon: Dorna Leitz, MD;  Location: [place];  Service: Orthopedics;  Laterality: Left;  . TUBAL LIGATION  1980's    Current Medications: Current Meds  Medication Sig  . acetaminophen (TYLENOL) 500 MG tablet Take 1,000 mg by mouth daily as needed for mild pain.  Marland Kitchen ALPRAZolam (XANAX) 0.5 MG tablet Take 0.5 mg by mouth 2 (two) times daily as needed for anxiety.   . calcium-vitamin D (OSCAL WITH D) 500-200 MG-UNIT per tablet Take 2 tablets by mouth daily.   . carvedilol (COREG) 3.125 MG tablet Take 1 tablet (3.125 mg total) by mouth 2 (two) times daily with a meal.  . Cholecalciferol (VITAMIN D) 2000 UNITS tablet Take 4,000 Units by mouth daily.  . ferrous sulfate 325 (65 FE) MG tablet Take 325 mg by mouth daily with breakfast.  . furosemide (LASIX) 40 MG tablet Take 1 tablet (40 mg total) by mouth 2 (two) times daily.  Marland Kitchen levothyroxine (SYNTHROID, LEVOTHROID) 100 MCG tablet Take 100 mcg daily before breakfast by mouth.  . Multiple Vitamin  (MULTIVITAMIN WITH MINERALS) TABS Take 1 tablet by mouth daily.  Marland Kitchen oxymetazoline (AFRIN) 0.05 % nasal spray Place 1 spray into both nostrils daily as needed for congestion.  . potassium chloride SA (KLOR-CON) 20 MEQ tablet Take 2 tablets (40 mEq total) by mouth daily.  . pravastatin (PRAVACHOL) 20 MG tablet TAKE 1 TABLET BY MOUTH EVERY DAY  . sacubitril-valsartan (ENTRESTO) 24-26 MG Take 1 tablet by mouth 2 (two) times daily.  . sertraline (ZOLOFT) 100 MG tablet Take 100 mg by mouth daily.  . traMADol (ULTRAM) 50 MG tablet   . tretinoin (RETIN-A) 0.05 % cream Apply 1 application topically at bedtime.   Marland Kitchen warfarin (COUMADIN) 4 MG tablet TAKE 1/2 TO 1 TABLET BY MOUTH DAILY AS DIRECTED BY COUMADIN CLINIC  . zolpidem (AMBIEN) 10 MG tablet Take 5 mg by mouth at bedtime as needed for sleep.  . [DISCONTINUED] furosemide (LASIX) 40 MG tablet TAKE 1 TABLET BY MOUTH DAILY. OKAY TO TAKE EXTRA FOR WEIGHT GAIN> 3LB/ DAILY OR 5LB/WEEKLY (  Patient taking differently: 40 mg 2 (two) times daily. )     Allergies:   Codeine, Beta adrenergic blockers, Metoprolol, Propoxyphene, Toprol xl [metoprolol tartrate], and Dilantin [phenytoin sodium extended]   Social History   Socioeconomic History  . Marital status: Married    Spouse name: Not on file  . Number of children: Not on file  . Years of education: Not on file  . Highest education level: Not on file  Occupational History  . Not on file  Tobacco Use  . Smoking status: Never Smoker  . Smokeless tobacco: Never Used  Substance and Sexual Activity  . Alcohol use: No    Comment: 11/12/2012 "no alcohol in the last 9 years"  . Drug use: No  . Sexual activity: Not Currently  Other Topics Concern  . Not on file  Social History Narrative   Pt lives in [place] with spouse.  Retired   Scientist, physiological Strain:   . Difficulty of Paying Living Expenses: Not on file  Food Insecurity:   . Worried About Charity fundraiser in  the Last Year: Not on file  . Ran Out of Food in the Last Year: Not on file  Transportation Needs:   . Lack of Transportation (Medical): Not on file  . Lack of Transportation (Non-Medical): Not on file  Physical Activity: Inactive  . Days of Exercise per Week: 0 days  . Minutes of Exercise per Session: 0 min  Stress:   . Feeling of Stress : Not on file  Social Connections: Unknown  . Frequency of Communication with Friends and Family: Once a week  . Frequency of Social Gatherings with Friends and Family: Not on file  . Attends Religious Services: 1 to 4 times per year  . Active Member of Clubs or Organizations: No  . Attends Archivist Meetings: Never  . Marital Status: Married     Family History: The patient's family history includes Heart disease in her brother; Hyperlipidemia in her father; Hypertension in her father and mother; Lung cancer in her mother.  ROS:   Please see the history of present illness.     All other systems reviewed and are negative.  EKGs/Labs/Other Studies Reviewed:    The following studies were reviewed today: TTE 09-23-19 1. Left ventricular ejection fraction, by estimation, is 30 to 35%. The  left ventricle has moderately decreased function. The left ventricle  demonstrates global hypokinesis. The left ventricular internal cavity size  was mildly dilated. There is moderate  asymmetric left ventricular hypertrophy. Left ventricular diastolic  function could not be evaluated.  2. Right ventricular systolic function is moderately reduced. The right  ventricular size is moderately enlarged. There is severely elevated  pulmonary artery systolic pressure.  3. Left atrial size was severely dilated.  4. Right atrial size was severely dilated.  5. The mitral valve is grossly normal. Severe mitral valve regurgitation.  No evidence of mitral stenosis.  6. Tricuspid valve regurgitation is severe.  7. The aortic valve has been  repaired/replaced. Aortic valve  regurgitation is not visualized. No aortic stenosis is present.  8. Aortic dilatation noted. 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  9. The inferior vena cava is dilated in size with <50% respiratory  variability, suggesting right atrial pressure of 15 mmHg.   EKG:  EKG is ordered today.  The ekg ordered today demonstrates almost regular rhythm without clear P waves,  RBBB morphology (junctional?, although some very subtle R-R irregularity and could be AFib))  Recent Labs: 04/27/2019: Pro B Natriuretic peptide (BNP) 264.0 06/08/2019: TSH 3.25 09/24/2019: ALT 20; B Natriuretic Peptide 696.5 09/25/2019: Magnesium 1.8 09/30/2019: Hemoglobin 13.0; Platelets 222 10/27/2019: BUN 22; Creatinine, Ser 0.90; Potassium 4.9; Sodium 141  Recent Lipid Panel    Component Value Date/Time   CHOL 175 05/27/2018 1125   TRIG 100 05/27/2018 1125   HDL 71 05/27/2018 1125   CHOLHDL 2.5 05/27/2018 1125   LDLCALC 84 05/27/2018 1125    Physical Exam:    VS:  BP 124/60   Pulse 61   Ht 5\' 7"  (1.702 m)   Wt 206 lb 12.8 oz (93.8 kg)   SpO2 98%   BMI 32.39 kg/m     Wt Readings from Last 3 Encounters:  12/31/19 206 lb 12.8 oz (93.8 kg)  12/04/19 206 lb 12.8 oz (93.8 kg)  10/27/19 207 lb (93.9 kg)     GEN: Mildly obese, well nourished, well developed in no acute distress HEENT: Normal NECK: No JVD; No carotid bruits LYMPHATICS: No lymphadenopathy CARDIAC: Irregular, crisp prosthetic valve clicks, 1/6 aortic ejection murmur, 1/6 holosystolic apical murmur early peaking, no diastolic murmurs, rubs, gallops RESPIRATORY:  Clear to auscultation without rales, wheezing or rhonchi  ABDOMEN: Soft, non-tender, non-distended MUSCULOSKELETAL:  No edema; No deformity  SKIN: Warm and dry NEUROLOGIC:  Alert and oriented x 3 PSYCHIATRIC:  Normal affect   ASSESSMENT:    1. Chronic systolic heart failure ([place])   2. Permanent atrial  fibrillation ([place])   3. H/O mechanical aortic valve replacement   4. Nonrheumatic mitral valve regurgitation   5. OSA (obstructive sleep apnea)   6. Ascending aorta dilatation (HCC)    PLAN:    In order of problems listed above:  1. CHF: Appears clinically euvolemic, NYHA functional class I.  Her blood pressure will not tolerate higher doses of carvedilol or Entresto or the addition of spironolactone.  Reminded her about the importance of sodium restriction (she has been very diligent about this) and daily weight monitoring.  No changes made to her medications.  The exact mechanism of the reduction in LVEF is unclear, since her atrial fibrillation appears to be well rate controlled and she does not have any signs or symptoms of coronary disease or any perfusion abnormalities on the nuclear stress test from May 2021.  We will recheck her echocardiogram.  Well underneath what we had established as a target dry weight of 215 pounds 2. AFib: Had 2 separate ablation procedures.  Failed amiodarone and dofetilide in the past.  I am not entirely sure whether her current rhythm is junctional escape or atrial fibrillation with slow ventricular rate.  Cannot increase the dose of carvedilol.  She is appropriately anticoagulated with warfarin for her mechanical aortic valve prosthesis. 3. Mech AVR: INR check today a little low at 1.7.  She does have a history of serious bleeding complications on 2 occasions (subdural hematoma 2006 and rectus sheath tumor 2019). 4. MR: Described as severe on her last echocardiogram, but with the appearance of a secondary regurgitant lesion.  Reevaluate by echo.  Mitral clip if it still severe. 5. OSA: Reports compliance with CPAP and denies daytime hypersomnolence.  Encouraged weight loss. 6. Asc Ao dilation: Todays maximum diameter recorded was 4.9 by echocardiogram, but has not had a CT since 2017 (when the maximum diameter was 4.3 cm).  Also found to have a two-vessel arch with  common origin  of the right innominate and left common carotid.   Medication Adjustments/Labs and Tests Ordered: Current medicines are reviewed at length with the patient today.  Concerns regarding medicines are outlined above.  Orders Placed This Encounter  Procedures  . EKG 12-Lead  . ECHOCARDIOGRAM COMPLETE   Meds ordered this encounter  Medications  . furosemide (LASIX) 40 MG tablet    Sig: Take 1 tablet (40 mg total) by mouth 2 (two) times daily.    Patient Instructions  Medication Instructions:  No changes *If you need a refill on your cardiac medications before your next appointment, please call your pharmacy*   Lab Work: None ordered If you have labs (blood work) drawn today and your tests are completely normal, you will receive your results only by: Marland Kitchen MyChart Message (if you have MyChart) OR . A paper copy in the mail If you have any lab test that is abnormal or we need to change your treatment, we will call you to review the results.   Testing/Procedures: Your physician has requested that you have an echocardiogram. Echocardiography is a painless test that uses sound waves to create images of your heart. It provides your doctor with information about the size and shape of your heart and how well your heart's chambers and valves are working. You may receive an ultrasound enhancing agent through an IV if needed to better visualize your heart during the echo.This procedure takes approximately one hour. There are no restrictions for this procedure. This will take place at the [address].     Follow-Up: At [facility], you and your health needs are our priority.  As part of our continuing mission to provide you with exceptional heart care, we have created designated Provider Care Teams.  These Care Teams include your primary Cardiologist (physician) and Advanced Practice Providers (APPs -  Physician Assistants and Nurse Practitioners) who all work together  to provide you with the care you need, when you need it.  We recommend signing up for the patient portal called "MyChart".  Sign up information is provided on this After Visit Summary.  MyChart is used to connect with patients for Virtual Visits (Telemedicine).  Patients are able to view lab/test results, encounter notes, upcoming appointments, etc.  Non-urgent messages can be sent to your provider as well.   To learn more about what you can do with MyChart, go to [email or web address].    Your next appointment:   6 month(s)  The format for your next appointment:   In Person  Provider:   Sanda Klein, MD     Signed, Sanda Klein, MD  12/31/2019 9:47 PM    [place]

## 2020-01-12 ENCOUNTER — Other Ambulatory Visit (HOSPITAL_COMMUNITY): Payer: Self-pay | Admitting: Cardiovascular Disease

## 2020-01-12 DIAGNOSIS — I509 Heart failure, unspecified: Secondary | ICD-10-CM

## 2020-01-13 ENCOUNTER — Other Ambulatory Visit: Payer: Self-pay

## 2020-01-13 ENCOUNTER — Ambulatory Visit (INDEPENDENT_AMBULATORY_CARE_PROVIDER_SITE_OTHER): Payer: Medicare Other | Admitting: Pulmonary Disease

## 2020-01-13 ENCOUNTER — Encounter: Payer: Self-pay | Admitting: Pulmonary Disease

## 2020-01-13 VITALS — BP 130/78 | HR 67 | Temp 97.9°F | Ht 67.0 in | Wt 206.0 lb

## 2020-01-13 DIAGNOSIS — G4733 Obstructive sleep apnea (adult) (pediatric): Secondary | ICD-10-CM | POA: Diagnosis not present

## 2020-01-13 DIAGNOSIS — R0602 Shortness of breath: Secondary | ICD-10-CM | POA: Diagnosis not present

## 2020-01-13 DIAGNOSIS — Z9989 Dependence on other enabling machines and devices: Secondary | ICD-10-CM | POA: Diagnosis not present

## 2020-01-13 NOTE — Patient Instructions (Signed)
Obstructive sleep apnea  -continue using CPAP on a regular basis  Regular exercises as tolerated  I will see you in about 3 months  Call with significant concerns

## 2020-01-13 NOTE — Progress Notes (Signed)
[name]    [id number]    Apr 29, 1945  Primary Care Physician:Webb, Arbie Cookey, MD  Referring Physician: Maurice Small, MD [place] [place],  [place] [address]  Chief complaint:   Patient being seen for daytime fatigue, shortness of breath Currently being treated for sleep apnea  HPI:  History of obstructive sleep apnea diagnosed in 2015 Repeat study with severe obstructive sleep apnea with CPAP of 17 recommended  She has been feeling well with using CPAP, remains compliant with use  Recently hospitalized for respiratory failure and decompensated heart failure Feeling better  She is exercising regularly and has managed to lose about 30 pounds since her last visit  history of atrial fibrillation History of pulmonary hypertension History of asthma  History of aortic valve disease for which he had repair in the past-has a mechanical aortic valve  She does try to exercise with an elliptical device for about 30 minutes on a regular basis, this is done in a seated position  Outpatient Encounter Medications as of 01/13/2020  Medication Sig  . acetaminophen (TYLENOL) 500 MG tablet Take 1,000 mg by mouth daily as needed for mild pain.  Marland Kitchen ALPRAZolam (XANAX) 0.5 MG tablet Take 0.5 mg by mouth 2 (two) times daily as needed for anxiety.   . calcium-vitamin D (OSCAL WITH D) 500-200 MG-UNIT per tablet Take 2 tablets by mouth daily.   . Cholecalciferol (VITAMIN D) 2000 UNITS tablet Take 4,000 Units by mouth daily.  . ferrous sulfate 325 (65 FE) MG tablet Take 325 mg by mouth daily with breakfast.  . furosemide (LASIX) 40 MG tablet Take 1 tablet (40 mg total) by mouth 2 (two) times daily.  Marland Kitchen levothyroxine (SYNTHROID, LEVOTHROID) 100 MCG tablet Take 100 mcg daily before breakfast by mouth.  . Multiple Vitamin (MULTIVITAMIN WITH MINERALS) TABS Take 1 tablet by mouth daily.  Marland Kitchen oxymetazoline (AFRIN) 0.05 % nasal spray Place 1 spray into both nostrils daily as  needed for congestion.  . potassium chloride SA (KLOR-CON) 20 MEQ tablet Take 2 tablets (40 mEq total) by mouth daily.  . pravastatin (PRAVACHOL) 20 MG tablet TAKE 1 TABLET BY MOUTH EVERY DAY  . sacubitril-valsartan (ENTRESTO) 24-26 MG Take 1 tablet by mouth 2 (two) times daily.  . sertraline (ZOLOFT) 100 MG tablet Take 100 mg by mouth daily.  . traMADol (ULTRAM) 50 MG tablet   . tretinoin (RETIN-A) 0.05 % cream Apply 1 application topically at bedtime.   Marland Kitchen warfarin (COUMADIN) 4 MG tablet TAKE 1/2 TO 1 TABLET BY MOUTH DAILY AS DIRECTED BY COUMADIN CLINIC  . zolpidem (AMBIEN) 10 MG tablet Take 5 mg by mouth at bedtime as needed for sleep.  . carvedilol (COREG) 3.125 MG tablet Take 1 tablet (3.125 mg total) by mouth 2 (two) times daily with a meal.   No facility-administered encounter medications on file as of 01/13/2020.    Allergies as of 01/13/2020 - Review Complete 01/13/2020  Allergen Reaction Noted  . Codeine Anaphylaxis 07/17/2012  . Beta adrenergic blockers  10/07/2012  . Metoprolol  10/23/2013  . Propoxyphene Other (See Comments) 10/23/2013  . Toprol xl [metoprolol tartrate]  07/17/2012  . Dilantin [phenytoin sodium extended] Rash and Itching 07/17/2012    Past Medical History:  Diagnosis Date  . Anxiety   . Aortic stenosis    status post aortic valve replacement with St. Jude mechanical prosthesis  . 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   65mm by echo 06/2016  and 52mm by CT angin 2016  . Chronic anticoagulation   . 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 Depression   . DJD (degenerative joint disease) of knee   . Dyslipidemia   . GERD (gastroesophageal reflux disease)    "several years ago; none since" (11/12/2012)  . Hyperlipidemia   . Hyperlipidemia LDL goal <70 01/19/2017  . Hypertension   . Left atrial enlargement   . Mitral regurgitation 06/25/2016   Mild moderate MR by echo 06/2016  . Obesity   . Persistent atrial  fibrillation (HCC)    s/p afib ablation x 2 at [facility]  . Sleep apnea    On CPAP at 12cm H2O  . 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 - now cleared by neurosurgery to maintain INR 2-2.5    Past Surgical History:  Procedure Laterality Date  . BURR HOLE FOR SUBDURAL HEMATOMA  2006  . CARDIAC VALVE REPLACEMENT  2006   St. Jude AVR  . CARDIOVERSION N/A 07/22/2012   Procedure: CARDIOVERSION;  Surgeon: [name], MD;  Location: [facility] ENDOSCOPY;  Service: Cardiovascular;  Laterality: N/A;  . CARDIOVERSION N/A 11/25/2012   Procedure: CARDIOVERSION;  Surgeon: [name], MD;  Location: [facility] ENDOSCOPY;  Service: Cardiovascular;  Laterality: N/A;  . CARDIOVERSION N/A 12/30/2012   Procedure: CARDIOVERSION;  Surgeon: [name], MD;  Location: [facility] ENDOSCOPY;  Service: Cardiovascular;  Laterality: N/A;  h&p in file-HW   . CARDIOVERSION N/A 03/30/2013   Procedure: CARDIOVERSION;  Surgeon: [name], MD;  Location: [facility] ENDOSCOPY;  Service: Cardiovascular;  Laterality: N/A;  . ELECTROPHYSIOLOGIC STUDY  11/2013   Afib ablation x 2 (11/2013 and 10/2015) at [facility] by Dr Clyda Hurdle with recurrence post ablation  . KNEE ARTHROSCOPY Left 1980's   "2" (11/12/2012)  . KNEE SURGERY Left 1980's   "after 2 scopes they went in and did some kind of OR" (11/12/2012)  . TEE WITHOUT CARDIOVERSION N/A 07/22/2012   Procedure: TRANSESOPHAGEAL ECHOCARDIOGRAM (TEE);  Surgeon: [name], MD;  Location: [facility] ENDOSCOPY;  Service: Cardiovascular;  Laterality: N/A;  Rm 2034  . TEE WITHOUT CARDIOVERSION N/A 10/07/2013   Procedure: TRANSESOPHAGEAL ECHOCARDIOGRAM (TEE);  Surgeon: [name], MD;  Location: [facility] ENDOSCOPY;  Service: Cardiovascular;  Laterality: N/A;  . TOTAL KNEE ARTHROPLASTY Left 11/05/2014   Procedure: TOTAL KNEE ARTHROPLASTY;  Surgeon: Dorna Leitz, MD;  Location: [place];  Service: Orthopedics;  Laterality: Left;  . TUBAL LIGATION  1980's    Family History  Problem Relation Age of  Onset  . Lung cancer Mother   . Hypertension Mother   . Hyperlipidemia Father   . Hypertension Father   . Heart disease Brother     Social History   Socioeconomic History  . Marital status: Married    Spouse name: Not on file  . Number of children: Not on file  . Years of education: Not on file  . Highest education level: Not on file  Occupational History  . Not on file  Tobacco Use  . Smoking status: Never Smoker  . Smokeless tobacco: Never Used  Substance and Sexual Activity  . Alcohol use: No    Comment: 11/12/2012 "no alcohol in the last 9 years"  . Drug use: No  . Sexual activity: Not Currently  Other Topics Concern  . Not on file  Social History Narrative   Pt lives in [place] with spouse.  Retired  Social Determinants of Health   Financial Resource Strain:   . Difficulty of Paying Living Expenses: Not on file  Food Insecurity:   . Worried About Charity fundraiser in the Last Year: Not on file  . Ran Out of Food in the Last Year: Not on file  Transportation Needs:   . Lack of Transportation (Medical): Not on file  . Lack of Transportation (Non-Medical): Not on file  Physical Activity: Inactive  . Days of Exercise per Week: 0 days  . Minutes of Exercise per Session: 0 min  Stress:   . Feeling of Stress : Not on file  Social Connections: Unknown  . Frequency of Communication with Friends and Family: Once a week  . Frequency of Social Gatherings with Friends and Family: Not on file  . Attends Religious Services: 1 to 4 times per year  . Active Member of Clubs or Organizations: No  . Attends Archivist Meetings: Never  . Marital Status: Married  Human resources officer Violence:   . Fear of Current or Ex-Partner: Not on file  . Emotionally Abused: Not on file  . Physically Abused: Not on file  . Sexually Abused: Not on file    Review of Systems  Constitutional: Positive for fatigue. Negative for fever.  Respiratory: Positive for apnea and  shortness of breath. Negative for cough.   Cardiovascular: Negative for chest pain.  Psychiatric/Behavioral: Positive for sleep disturbance.    Vitals:   01/13/20 0957  BP: 130/78  Pulse: 67  Temp: 97.9 F (36.6 C)  SpO2: 97%     Physical Exam Constitutional:      Appearance: She is obese.  HENT:     Head: Normocephalic.  Eyes:     General:        Right eye: No discharge.        Left eye: No discharge.  Cardiovascular:     Rate and Rhythm: Normal rate and regular rhythm.     Pulses: Normal pulses.     Heart sounds: Normal heart sounds. No murmur heard.  No friction rub.  Pulmonary:     Effort: Pulmonary effort is normal. No respiratory distress.     Breath sounds: No stridor. No wheezing, rhonchi or rales.  Musculoskeletal:     Cervical back: No rigidity or tenderness.  Neurological:     Mental Status: She is alert.     Data Reviewed: PFT reviewed showing combined obstruction and restriction Previous sleep study significant for severe obstructive sleep apnea Echocardiogram significant for ejection fraction of 30 to 35% with moderately reduced right ventricular systolic function, severe pulmonary hypertension Recent sleep study reviewed showing severe obstructive sleep apnea, CPAP of 17   CPAP download  Compliance of 100% CPAP of 17 AHI of 0.9  assessment:  Severe obstructive sleep apnea -Continue CPAP of 17  Restrictive lung disease -Reduced TLC on PFT -Morbid obesity  Heart failure with reduced ejection fraction Diastolic heart failure Paroxysmal atrial fibrillation Hypertension -Dilated atria, pulmonary hypertension   Aortic valvulopathy Mitral valvulopathy  Deconditioning  Plan/Recommendations: Encouraged to continue CPAP use on a regular basis  Graded exercise as tolerated  Continue inhalers   Encouraged to follow-up with Dr. Melvyn Novas  Follow-up in 3 months  Sherrilyn Rist MD [facility] 01/13/2020, 10:16  AM  CC: Maurice Small, MD

## 2020-01-14 ENCOUNTER — Other Ambulatory Visit: Payer: Self-pay

## 2020-01-14 ENCOUNTER — Ambulatory Visit (HOSPITAL_COMMUNITY): Payer: Medicare Other | Attending: Internal Medicine

## 2020-01-14 DIAGNOSIS — I509 Heart failure, unspecified: Secondary | ICD-10-CM | POA: Insufficient documentation

## 2020-01-14 DIAGNOSIS — I5022 Chronic systolic (congestive) heart failure: Secondary | ICD-10-CM

## 2020-01-14 LAB — ECHOCARDIOGRAM COMPLETE
AV Mean grad: 10 mmHg
AV Peak grad: 17.8 mmHg
Ao pk vel: 2.11 m/s
Area-P 1/2: 3.89 cm2
MV M vel: 6.13 m/s
MV Peak grad: 150.3 mmHg
Radius: 0.7 cm
S' Lateral: 4.9 cm

## 2020-01-20 ENCOUNTER — Encounter: Payer: Self-pay | Admitting: *Deleted

## 2020-01-20 NOTE — Telephone Encounter (Addendum)
-----   Message from Sanda Klein, MD sent at 01/19/2020  3:42 PM EDT ----- There may be very slight improvement in heart pumping function. The mitral insufficiency is still severe. Some data on the echo makes me believe that her BP is now a little higher.  IF BP is more than 110/65 I would like her to increase the Entresto to the next dose (49/51). If she has a lot of the 24/26 dose left over, can take 2 at a time.  She has a coumadin clinic appt on 10/20. Could she please bring a log of her BP to that appt it would be very helpful (we can also check a BMET at that visit if she increases the [place]).  Left message for pt to call

## 2020-01-21 ENCOUNTER — Telehealth: Payer: Self-pay | Admitting: Cardiovascular Disease

## 2020-01-21 NOTE — Telephone Encounter (Signed)
Patient is calling back to receive her results. 

## 2020-01-21 NOTE — Telephone Encounter (Signed)
Patient made aware of results and verbalized understanding.  There may be very slight improvement in heart pumping function. The mitral insufficiency is still severe.  Some data on the echo makes me believe that her BP is now a little higher.  IF BP is more than 110/65 I would like her to increase the Entresto to the next dose (49/51). If she has a lot of the 24/26 dose left over, can take 2 at a time.  She has a coumadin clinic appt on 10/20. Could she please bring a log of her BP to that appt it would be very helpful (we can also check a BMET at that visit if she increases the [place]).   She has not been taking her blood pressure. She has been advised to keep a log of the pressures and heart rates, an hour or two after taking her medications. We will call back next week for the readings to see if the [facility] may be titrated up.

## 2020-01-22 NOTE — Telephone Encounter (Signed)
This encounter was created in error - please disregard.

## 2020-01-26 ENCOUNTER — Other Ambulatory Visit: Payer: Self-pay | Admitting: *Deleted

## 2020-01-26 ENCOUNTER — Other Ambulatory Visit: Payer: Self-pay | Admitting: General Practice

## 2020-01-26 ENCOUNTER — Other Ambulatory Visit: Payer: Self-pay

## 2020-01-26 ENCOUNTER — Ambulatory Visit: Payer: Medicare Other | Attending: Family Medicine

## 2020-01-26 DIAGNOSIS — M25551 Pain in right hip: Secondary | ICD-10-CM | POA: Insufficient documentation

## 2020-01-26 DIAGNOSIS — I5022 Chronic systolic (congestive) heart failure: Secondary | ICD-10-CM

## 2020-01-26 DIAGNOSIS — M6281 Muscle weakness (generalized): Secondary | ICD-10-CM | POA: Insufficient documentation

## 2020-01-26 DIAGNOSIS — R252 Cramp and spasm: Secondary | ICD-10-CM | POA: Diagnosis not present

## 2020-01-26 DIAGNOSIS — M25552 Pain in left hip: Secondary | ICD-10-CM | POA: Insufficient documentation

## 2020-01-26 DIAGNOSIS — R2689 Other abnormalities of gait and mobility: Secondary | ICD-10-CM | POA: Diagnosis not present

## 2020-01-26 MED ORDER — SACUBITRIL-VALSARTAN 49-51 MG PO TABS: 1.0000 | ORAL_TABLET | Freq: Two times a day (BID) | ORAL | 11 refills | Status: DC

## 2020-01-26 NOTE — Therapy (Signed)
[facility] [address], [place], [place], [address] Phone: [phone]   Fax:  [phone]  Physical Therapy Evaluation  Patient Details  Name: [name] MRN: [id number] Date of Birth: Sep 03, 1945 Referring Provider (PT): Maurice Small, MD   Encounter Date: 01/26/2020   PT End of Session - 01/26/20 1311    Visit Number 1    Date for PT Re-Evaluation 03/22/20    Authorization Type Medicare    PT Start Time 1230    PT Stop Time 1309    PT Time Calculation (min) 39 min    Activity Tolerance Patient tolerated treatment well    Behavior During Therapy [facility] for tasks assessed/performed           Past Medical History:  Diagnosis Date  . Anxiety   . Aortic stenosis    status post aortic valve replacement with St. Jude mechanical prosthesis  . 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   36mm by echo 06/2016 and 92mm by CT angin 2016  . Chronic anticoagulation   . 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 Depression   . DJD (degenerative joint disease) of knee   . Dyslipidemia   . GERD (gastroesophageal reflux disease)    "several years ago; none since" (11/12/2012)  . Hyperlipidemia   . Hyperlipidemia LDL goal <70 01/19/2017  . Hypertension   . Left atrial enlargement   . Mitral regurgitation 06/25/2016   Mild moderate MR by echo 06/2016  . Obesity   . Persistent atrial fibrillation (HCC)    s/p afib ablation x 2 at [facility]  . Sleep apnea    On CPAP at 12cm H2O  . 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 - now cleared by neurosurgery to maintain INR 2-2.5    Past Surgical History:  Procedure Laterality Date  . BURR HOLE FOR SUBDURAL HEMATOMA  2006  . CARDIAC VALVE REPLACEMENT  2006   St. Jude AVR  . CARDIOVERSION N/A 07/22/2012   Procedure: CARDIOVERSION;  Surgeon: [name], MD;  Location: [facility] ENDOSCOPY;  Service: Cardiovascular;   Laterality: N/A;  . CARDIOVERSION N/A 11/25/2012   Procedure: CARDIOVERSION;  Surgeon: [name], MD;  Location: [facility] ENDOSCOPY;  Service: Cardiovascular;  Laterality: N/A;  . CARDIOVERSION N/A 12/30/2012   Procedure: CARDIOVERSION;  Surgeon: [name], MD;  Location: [facility] ENDOSCOPY;  Service: Cardiovascular;  Laterality: N/A;  h&p in file-HW   . CARDIOVERSION N/A 03/30/2013   Procedure: CARDIOVERSION;  Surgeon: [name], MD;  Location: [facility] ENDOSCOPY;  Service: Cardiovascular;  Laterality: N/A;  . ELECTROPHYSIOLOGIC STUDY  11/2013   Afib ablation x 2 (11/2013 and 10/2015) at [facility] by Dr Clyda Hurdle with recurrence post ablation  . KNEE ARTHROSCOPY Left 1980's   "2" (11/12/2012)  . KNEE SURGERY Left 1980's   "after 2 scopes they went in and did some kind of OR" (11/12/2012)  . TEE WITHOUT CARDIOVERSION N/A 07/22/2012   Procedure: TRANSESOPHAGEAL ECHOCARDIOGRAM (TEE);  Surgeon: [name], MD;  Location: [facility] ENDOSCOPY;  Service: Cardiovascular;  Laterality: N/A;  Rm 2034  . TEE WITHOUT CARDIOVERSION N/A 10/07/2013   Procedure: TRANSESOPHAGEAL ECHOCARDIOGRAM (TEE);  Surgeon: [name], MD;  Location: [facility] ENDOSCOPY;  Service: Cardiovascular;  Laterality: N/A;  . TOTAL KNEE ARTHROPLASTY Left 11/05/2014   Procedure: TOTAL KNEE ARTHROPLASTY;  Surgeon: Dorna Leitz, MD;  Location: [place];  Service: Orthopedics;  Laterality: Left;  .  TUBAL LIGATION  1980's    There were no vitals filed for this visit.    Subjective Assessment - 01/26/20 1230    Subjective Pt presents to PT with Rt>Lt hip and LBP that began 3-4 months ago.  Pt reports that she sews a lot and is flexed forward with this. Pt reports aching pain in the evenings.    Pertinent History aortic valve replacement 2006, TKA Lt 2016, Ejection fraction 35-40%    Limitations Walking    How long can you walk comfortably? limited by edurnace due to ejection fraction (35-40%)    Diagnostic tests none    Patient Stated Goals reduce pain in the evenings,  get back to walking    Currently in Pain? Yes    Pain Score 0-No pain   Rt>Lt buttock: up to 3/10   Pain Location Buttocks    Pain Orientation Right;Left    Pain Descriptors / Indicators Aching    Pain Type Chronic pain    Pain Onset More than a month ago    Pain Frequency Intermittent    Aggravating Factors  evening hours, cooking    Pain Relieving Factors sit down, earlier in the day              [facility] PT Assessment - 01/26/20 0001      Assessment   Medical Diagnosis Rt hip pain    Referring Provider (PT) Maurice Small, MD    Onset Date/Surgical Date 09/26/19    Prior Therapy none       Precautions   Precautions Other (comment)    Precaution Comments Ejection Fraction: 35-40% per pt report, aortic valve replacement       Balance Screen   Has the patient fallen in the past 6 months No    Has the patient had a decrease in activity level because of a fear of falling?  No    Is the patient reluctant to leave their home because of a fear of falling?  No      Home Environment   Living Environment Private residence    Living Arrangements Spouse/significant other    Type of [place] Access Level entry    [place] One level      Prior Function   Level of [place] Retired    Leisure sewing, walking, paly with dogs, cooking       Cognition   Overall Cognitive Status Within Functional Limits for tasks assessed      Observation/Other Assessments   Focus on Therapeutic Outcomes (FOTO)  53% limitation      Posture/Postural Control   Posture/Postural Control Postural limitations    Postural Limitations Flexed trunk;Weight shift left;Forward head      ROM / Strength   AROM / PROM / Strength AROM;PROM;Strength      AROM   Overall AROM  Within functional limits for tasks performed    Overall AROM Comments lumbar and hip A/ROM are WFLs without pain      PROM   Overall PROM  Within functional limits for tasks performed       Strength   Overall Strength Deficits    Overall Strength Comments bil hips 4/5, knees 4+/5      Palpation   SI assessment  trigger points in Rt proximal gluteals, no additional palpable tenderness      Transfers   Transfers Stand to Sit;Sit to Stand    Sit to Stand 6:  Modified independent (Device/Increase time);With upper extremity assist    Five time sit to stand comments  not able to perform due to becoming short of breath    Stand to Sit With upper extremity assist      Ambulation/Gait   Ambulation/Gait Yes    Gait Pattern Step-through pattern;Scissoring;Trendelenburg;Lateral hip instability                      Objective measurements completed on examination: See above findings.                 PT Short Term Goals - 01/26/20 1246      PT SHORT TERM GOAL #1   Title be independent in initial HEP    Time 4    Period Weeks    Status New    Target Date 02/23/20      PT SHORT TERM GOAL #2   Title report a 30% reduction in bil buttock/low back pain in the evenings    Time 4    Period Weeks    Status New    Target Date 02/23/20      PT SHORT TERM GOAL #3   Title improve LE functional strength to perform sit to stand with minimal UE support    Time 4    Period Weeks    Status New    Target Date 02/23/20             PT Long Term Goals - 01/26/20 1228      PT LONG TERM GOAL #1   Title be independent in advanced HEP    Time 8    Period Weeks    Status New    Target Date 03/22/20      PT LONG TERM GOAL #2   Title reduce FOTO to < or = to 39% limitation    Time 8    Period Weeks    Status New    Target Date 03/22/20      PT LONG TERM GOAL #3   Title report a 70% reduction in Bil buttock and low back pain in the evenings    Time 8    Period Weeks    Status New    Target Date 03/22/20      PT LONG TERM GOAL #4   Title initiate a walking program and verbalize safe progression of  this related to cardiac status    Time 8    Period  Weeks    Status New    Target Date 03/22/20      PT LONG TERM GOAL #5   Title improve LE functional strength to perform sit to stand without UE support x 3 reps    Time 8    Period Weeks    Status New    Target Date 03/22/20                  Plan - 01/26/20 1317    Clinical Impression Statement Pt presents to PT with Rt>Lt hip/buttock pain that began 3-4 months ago. Pt reports that she sits to sew a lot while making quilts and feels this might be contributing her her pain.  Pt reports 3/10 "aching" pain in bil gluteals and low back mostly at the end of the day and with cooking.  Pt with complex medical history including aortic valve replacement surgery in 2006, current ejection fraction of 35-40% which limits endurance for functional mobility and Lt total knee replacement  from 2016.  Pt ambulates with scissoring gait pattern, Trendelenburg and lateral hip instability bilaterally.  Pt requires max UE support with sit to stand and fair eccentric control with stand to sit.  Pt demonstrates reduced bil hip strength and has palpable tenderness over Rt proximal gluteals with active trigger points.  Pt will benefit from skilled PT to address functional LE strength with emphasis on lumbopelvic stabilization, gait and balance training for stability and manual therapy to address LBP and bil gluteal pain.    Personal Factors and Comorbidities Comorbidity 3+    Comorbidities TKA Lt, aortic valve replacement, ejection fraction 35-40%, a-fib    Examination-Activity Limitations Transfers;Locomotion Level;Stand;Stairs    Examination-Participation Restrictions Cleaning;Community Activity;Shop;Meal Prep    Stability/Clinical Decision Making Evolving/Moderate complexity    Clinical Decision Making Moderate    Rehab Potential Good    PT Frequency 2x / week    PT Duration 8 weeks    PT Treatment/Interventions ADLs/Self Care Home Management;Cryotherapy;Electrical Stimulation;Moist Heat;Gait training;Stair  training;Functional mobility training;Therapeutic activities;Therapeutic exercise;Balance training;Neuromuscular re-education;Manual techniques;Patient/family education;Passive range of motion;Dry needling;Taping    PT Next Visit Plan work on sit to stand, lumbopelvic strength, manual to Rt gluteals, review HEP    PT Home Exercise Plan Access Code: [email or web address]    [email or web address] and Agree with Plan of Care Patient           Patient will benefit from skilled therapeutic intervention in order to improve the following deficits and impairments:  Abnormal gait, Decreased activity tolerance, Decreased strength, Decreased balance, Pain, Decreased endurance, Increased muscle spasms  Visit Diagnosis: Pain in right hip - Plan: PT plan of care cert/re-cert  Pain in left hip - Plan: PT plan of care cert/re-cert  Muscle weakness (generalized) - Plan: PT plan of care cert/re-cert  Cramp and spasm - Plan: PT plan of care cert/re-cert  Other abnormalities of gait and mobility - Plan: PT plan of care cert/re-cert     Problem List Patient Active Problem List   Diagnosis Date Noted  . CHF exacerbation ([place]) 09/25/2019  . Acute respiratory failure with hypoxia ([place]) 09/24/2019  . History of subdural hematoma 09/24/2019  . Hypothyroidism 09/24/2019  . Acute on chronic combined systolic and diastolic CHF (congestive heart failure) ([place]) 09/24/2019  . Permanent atrial fibrillation ([place]) 06/18/2019  . Severe tricuspid regurgitation 12/18/2018  . Cholelithiasis without cholecystitis 07/31/2017  . Family history of colon cancer 07/31/2017  . Slow transit constipation 07/31/2017  . Hematoma 07/11/2017  . Rectus sheath hematoma, initial encounter   . Hyperlipidemia LDL goal <70 01/19/2017  . Morbid obesity due to excess calories ([place]) complicated by low ERV on pfts/ hbp/hyperlipidemia 09/02/2016  . Mitral regurgitation 06/25/2016  . Ascending aorta dilatation ([place]) 06/25/2016  . Dyspnea on exertion 04/04/2016   . Sinusitis, chronic 04/04/2016  . Cough variant asthma vs UACS 04/03/2016  . Primary osteoarthritis of left knee   . DJD (degenerative joint disease) of knee 11/05/2014  . Encounter for therapeutic drug monitoring 05/07/2013  . Obstructive sleep apnea 01/20/2013  . H/O mechanical aortic valve replacement 01/15/2013  . Aortic valve disorder 01/15/2013  . Chronic diastolic CHF (congestive heart failure) ([place]) 11/14/2012  . Paroxysmal atrial fibrillation ([place]) 07/17/2012  . Essential hypertension 07/17/2012     Sigurd Sos, PT 01/26/20 1:23 PM  [facility] [address] [place], [place], [address] Phone: [phone]   Fax:  [phone]  Name: LANDI BISCARDI MRN: [id number] Date of Birth: [birth year]

## 2020-01-26 NOTE — Patient Instructions (Signed)
Access Code: [email or web address] URL: [email or web address] Date: 01/26/2020 Prepared by: Claiborne Billings  Exercises Hooklying Single Knee to Chest - 3 x daily - 7 x weekly - 1 sets - 3 reps - 20 hold Seated Hamstring Stretch - 3 x daily - 7 x weekly - 1 sets - 3 reps - 20 hold Sit to Stand - 2 x daily - 7 x weekly - 2 sets - 5 reps Seated Long Arc Quad - 2 x daily - 7 x weekly - 2 sets - 10 reps - 5 hold

## 2020-01-27 ENCOUNTER — Other Ambulatory Visit: Payer: Self-pay

## 2020-01-27 ENCOUNTER — Ambulatory Visit (INDEPENDENT_AMBULATORY_CARE_PROVIDER_SITE_OTHER): Payer: Medicare Other

## 2020-01-27 DIAGNOSIS — I480 Paroxysmal atrial fibrillation: Secondary | ICD-10-CM

## 2020-01-27 DIAGNOSIS — Z5181 Encounter for therapeutic drug level monitoring: Secondary | ICD-10-CM

## 2020-01-27 DIAGNOSIS — I4819 Other persistent atrial fibrillation: Secondary | ICD-10-CM

## 2020-01-27 LAB — POCT INR: INR: 1.5 — AB (ref 2.0–3.0)

## 2020-01-27 NOTE — Patient Instructions (Signed)
Take  1 1/2 tablet today ONLY, then Continue with 1 tablet daily except 1/2 tablet each Sunday and Wednesday . Recheck INR in 4 weeks.  [facility] [phone].

## 2020-02-03 ENCOUNTER — Ambulatory Visit: Payer: Medicare Other | Admitting: Physical Therapy

## 2020-02-03 ENCOUNTER — Other Ambulatory Visit: Payer: Self-pay

## 2020-02-03 ENCOUNTER — Encounter: Payer: Self-pay | Admitting: Physical Therapy

## 2020-02-03 DIAGNOSIS — R2689 Other abnormalities of gait and mobility: Secondary | ICD-10-CM

## 2020-02-03 DIAGNOSIS — M25551 Pain in right hip: Secondary | ICD-10-CM

## 2020-02-03 DIAGNOSIS — M25552 Pain in left hip: Secondary | ICD-10-CM | POA: Diagnosis not present

## 2020-02-03 DIAGNOSIS — R252 Cramp and spasm: Secondary | ICD-10-CM

## 2020-02-03 DIAGNOSIS — M6281 Muscle weakness (generalized): Secondary | ICD-10-CM

## 2020-02-03 NOTE — Patient Instructions (Signed)
Access Code: [email or web address] URL: [email or web address] Date: 02/03/2020 Prepared by: Almyra Free  Exercises Seated Hamstring Stretch - 3 x daily - 7 x weekly - 1 sets - 3 reps - 20 hold Sit to Stand - 2 x daily - 7 x weekly - 2 sets - 5 reps Seated Long Arc Quad - 2 x daily - 7 x weekly - 2 sets - 10 reps - 5 hold Seated Piriformis Stretch with Trunk Bend - 2 x daily - 7 x weekly - 3 reps - 1 sets - 30-60 sec hold Standing Hip Abduction with Counter Support - 1 x daily - 7 x weekly - 3 sets - 10 reps Standing Hip Extension with Counter Support - 1 x daily - 7 x weekly - 3 sets - 10 reps

## 2020-02-03 NOTE — Therapy (Addendum)
[facility] [address] [place], [place], [address] Phone: [phone]   Fax:  [phone]  Physical Therapy Treatment  Patient Details  Name: [name] MRN: [id number] Date of Birth: Aug 08, 1945 Referring Provider (PT): Maurice Small, MD   Encounter Date: 02/03/2020   PT End of Session - 02/03/20 1411    Visit Number 2    Date for PT Re-Evaluation 03/22/20    Authorization Type Medicare    PT Start Time 5053    PT Stop Time 1445    PT Time Calculation (min) 33 min    Activity Tolerance Patient tolerated treatment well    Behavior During Therapy [facility] for tasks assessed/performed           Past Medical History:  Diagnosis Date  . Anxiety   . Aortic stenosis    status post aortic valve replacement with St. Jude mechanical prosthesis  . 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   25m by echo 06/2016 and 429mby CT angin 2016  . Chronic anticoagulation   . 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 Depression   . DJD (degenerative joint disease) of knee   . Dyslipidemia   . GERD (gastroesophageal reflux disease)    "several years ago; none since" (11/12/2012)  . Hyperlipidemia   . Hyperlipidemia LDL goal <70 01/19/2017  . Hypertension   . Left atrial enlargement   . Mitral regurgitation 06/25/2016   Mild moderate MR by echo 06/2016  . Obesity   . Persistent atrial fibrillation (HCC)    [facility]. Sleep apnea    On CPAP at 12cm H2O  . 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 - now cleared by neurosurgery to maintain INR 2-2.5    Past Surgical History:  Procedure Laterality Date  . BURR HOLE FOR SUBDURAL HEMATOMA  2006  . CARDIAC VALVE REPLACEMENT  2006   St. Jude AVR  . CARDIOVERSION N/A 07/22/2012   Procedure: CARDIOVERSION;  Surgeon: MaCandee FurbishMD;  [facility]  Service: Cardiovascular;   Laterality: N/A;  . CARDIOVERSION N/A 11/25/2012   Procedure: CARDIOVERSION;  Surgeon: TrSueanne MargaritaMD;  [facility];  Service: Cardiovascular;  Laterality: N/A;  . CARDIOVERSION N/A 12/30/2012   Procedure: CARDIOVERSION;  Surgeon: TrSueanne MargaritaMD;  [facility];  Service: Cardiovascular;  Laterality: N/A;  h&p in file-HW   . CARDIOVERSION N/A 03/30/2013   Procedure: CARDIOVERSION;  Surgeon: TrSueanne MargaritaMD;  [facility];  Service: Cardiovascular;  Laterality: N/A;  . ELECTROPHYSIOLOGIC STUDY  11/2013   [facility]ib [facility]11[facility]2013 and [facility]2015[facility] recurrence post ablation  . KNEE ARTHROSCOPY Left 1980's   "2" (11/12/2012)  . KNEE SURGERY Left 1980's   "after 2 scopes they went in and did some kind of OR" (11/12/2012)  . TEE WITHOUT CARDIOVERSION N/A 07/22/2012   Procedure: TRANSESOPHAGEAL ECHOCARDIOGRAM (TEE);  Surgeon: MaCandee FurbishMD;  [facility];  Service: Cardiovascular;  Laterality: N/A;  Rm 2034  . TEE WITHOUT CARDIOVERSION N/A 10/07/2013   Procedure: TRANSESOPHAGEAL ECHOCARDIOGRAM (TEE);  Surgeon: MaCandee FurbishMD;  [facility];  Service: Cardiovascular;  Laterality: N/A;  . TOTAL KNEE ARTHROPLASTY Left 11/05/2014   Procedure: TOTAL KNEE ARTHROPLASTY;  Surgeon: JoDorna LeitzMD;  [facility];  Laterality: Left;  .  TUBAL LIGATION  1980's    There were no vitals filed for this visit.   Subjective Assessment - 02/03/20 1414    Subjective No complaints today. Has started walking.    Pertinent History aortic valve replacement 2006, TKA Lt 2016, Ejection fraction 35-40%    Limitations Walking    How long can you walk comfortably? limited by edurnace due to ejection fraction (35-40%)    Diagnostic tests none    Patient Stated Goals reduce pain in the evenings, get back to walking    Currently in Pain? No/denies                             [facility]  Treatment/Exercise - 02/03/20 0001      Exercises   Exercises Knee/Hip      Knee/Hip Exercises: Stretches   Passive Hamstring Stretch Both;2 reps;30 seconds    Passive Hamstring Stretch Limitations with leg on mat table    ITB Stretch Both;1 rep;60 seconds    ITB Stretch Limitations fig 4 with trunk lean      Knee/Hip Exercises: Standing   Heel Raises Both;10 reps    Hip Flexion Both;Knee bent    Hip Abduction Both;20 reps;Knee straight    Hip Extension Both;20 reps;Knee straight    Forward Step Up Both;1 set;10 reps;Hand Hold: 2;Step Height: 6"      Knee/Hip Exercises: Seated   Long Arc Quad Both;3 sets;10 reps;Weights    Long Arc Quad Weight 3 lbs.    Long [employer] Limitations heavier next time    Sit to [employer] 10 reps;without UE support   hands on thighs                 PT Education - 02/03/20 1449    Education Details HEP    Person(s) Educated Patient    Methods Explanation;Demonstration;Handout    Comprehension Verbalized understanding;Returned demonstration            PT Short Term Goals - 01/26/20 1246      PT SHORT TERM GOAL #1   Title be independent in initial HEP    Time 4    Period Weeks    Status New    Target Date 02/23/20      PT SHORT TERM GOAL #2   Title report a 30% reduction in bil buttock/low back pain in the evenings    Time 4    Period Weeks    Status New    Target Date 02/23/20      PT SHORT TERM GOAL #3   Title improve LE functional strength to perform sit to stand with minimal UE support    Time 4    Period Weeks    Status New    Target Date 02/23/20             PT Long Term Goals - 01/26/20 1228      PT LONG TERM GOAL #1   Title be independent in advanced HEP    Time 8    Period Weeks    Status New    Target Date 03/22/20      PT LONG TERM GOAL #2   Title reduce FOTO to < or = to 39% limitation    Time 8    Period Weeks    Status New    Target Date 03/22/20      PT LONG TERM GOAL #3   Title report  a 70%  reduction in Bil buttock and low back pain in the evenings    Time 8    Period Weeks    Status New    Target Date 03/22/20      PT LONG TERM GOAL #4   Title initiate a walking program and verbalize safe progression of  this related to cardiac status    Time 8    Period Weeks    Status New    Target Date 03/22/20      PT LONG TERM GOAL #5   Title improve LE functional strength to perform sit to stand without UE support x 3 reps    Time 8    Period Weeks    Status New    Target Date 03/22/20                 Plan - 02/03/20 1446    Clinical Impression Statement Patient was 12 min late. She did well with TE today. She is unstable in SLS on the right leg with opp hip ABD and ext. Supine exercises are difficult due to heart issues so we stayed upright and modified HEP.    Personal Factors and Comorbidities Comorbidity 3+    Comorbidities TKA Lt, aortic valve replacement, ejection fraction 35-40%, a-fib    Examination-Activity Limitations Transfers;Locomotion Level;Stand;Stairs    Examination-Participation Restrictions Cleaning;Community Activity;Shop;Meal Prep    PT Frequency 2x / week    PT Duration 8 weeks    PT Treatment/Interventions ADLs/Self Care Home Management;Cryotherapy;Electrical Stimulation;Moist Heat;Gait training;Stair training;Functional mobility training;Therapeutic activities;Therapeutic exercise;Balance training;Neuromuscular re-education;Manual techniques;Patient/family education;Passive range of motion;Dry needling;Taping    PT Next Visit Plan work on sit to stand, lumbopelvic strength, manual to Rt gluteals, review HEP    PT Home Exercise Plan Access Code: [email or web address]    [email or web address] and Agree with Plan of Care Patient           Patient will benefit from skilled therapeutic intervention in order to improve the following deficits and impairments:  Abnormal gait, Decreased activity tolerance, Decreased strength, Decreased balance, Pain, Decreased endurance,  Increased muscle spasms  Visit Diagnosis: Pain in right hip  Pain in left hip  Muscle weakness (generalized)  Cramp and spasm  Other abnormalities of gait and mobility     Problem List Patient Active Problem List   Diagnosis Date Noted  . CHF exacerbation ([place]) 09/25/2019  . Acute respiratory failure with hypoxia ([place]) 09/24/2019  . History of subdural hematoma 09/24/2019  . Hypothyroidism 09/24/2019  . Acute on chronic combined systolic and diastolic CHF (congestive heart failure) ([place]) 09/24/2019  . Permanent atrial fibrillation ([place]) 06/18/2019  . Severe tricuspid regurgitation 12/18/2018  . Cholelithiasis without cholecystitis 07/31/2017  . Family history of colon cancer 07/31/2017  . Slow transit constipation 07/31/2017  . Hematoma 07/11/2017  . Rectus sheath hematoma, initial encounter   . Hyperlipidemia LDL goal <70 01/19/2017  . Morbid obesity due to excess calories ([place]) complicated by low ERV on pfts/ hbp/hyperlipidemia 09/02/2016  . Mitral regurgitation 06/25/2016  . Ascending aorta dilatation ([place]) 06/25/2016  . Dyspnea on exertion 04/04/2016  . Sinusitis, chronic 04/04/2016  . Cough variant asthma vs UACS 04/03/2016  . Primary osteoarthritis of left knee   . DJD (degenerative joint disease) of knee 11/05/2014  . Encounter for therapeutic drug monitoring 05/07/2013  . Obstructive sleep apnea 01/20/2013  . H/O mechanical aortic valve replacement 01/15/2013  . Aortic valve disorder 01/15/2013  . Chronic diastolic CHF (congestive heart failure) ([place]) 11/14/2012  .  Paroxysmal atrial fibrillation ([place]) 07/17/2012  . Essential hypertension 07/17/2012    Madelyn Flavors PT 02/03/2020, 2:50 PM PHYSICAL THERAPY DISCHARGE SUMMARY  Visits from Start of Care: 2  Current functional level related to goals / functional outcomes: Pt didn't return to PT after session on 02/03/20.     Remaining deficits: See above for most current PT status.     Education /  Equipment: HEP Plan: Patient agrees to discharge.  Patient goals were not met. Patient is being discharged due to not returning since the last visit.  ?????        Sigurd Sos, PT 05/11/20 10:52 AM  [facility] [address] [place], [place], [address] Phone: [phone]   Fax:  [phone]  Name: [name] MRN: [id number] Date of Birth: [birth year]

## 2020-02-05 ENCOUNTER — Other Ambulatory Visit: Payer: Self-pay

## 2020-02-05 DIAGNOSIS — I5022 Chronic systolic (congestive) heart failure: Secondary | ICD-10-CM

## 2020-02-05 LAB — BASIC METABOLIC PANEL
BUN/Creatinine Ratio: 23 (ref 12–28)
BUN: 19 mg/dL (ref 8–27)
CO2: 27 mmol/L (ref 20–29)
Calcium: 9.1 mg/dL (ref 8.7–10.3)
Chloride: 99 mmol/L (ref 96–106)
Creatinine, Ser: 0.82 mg/dL (ref 0.57–1.00)
GFR calc Af Amer: 82 mL/min/{1.73_m2} (ref >59)
GFR calc non Af Amer: 71 mL/min/{1.73_m2} (ref >59)
Glucose: 86 mg/dL (ref 65–99)
Potassium: 4.4 mmol/L (ref 3.5–5.2)
Sodium: 138 mmol/L (ref 134–144)

## 2020-02-09 ENCOUNTER — Encounter: Payer: Self-pay | Admitting: *Deleted

## 2020-02-22 ENCOUNTER — Encounter: Payer: Medicare Other | Admitting: Physical Therapy

## 2020-02-23 DIAGNOSIS — Z23 Encounter for immunization: Secondary | ICD-10-CM | POA: Diagnosis not present

## 2020-02-24 ENCOUNTER — Telehealth: Payer: Self-pay

## 2020-02-24 NOTE — Telephone Encounter (Signed)
Left patient message in regards to missed INR appt on 11/17 and to hopefully reschedule on 11/19.

## 2020-03-01 ENCOUNTER — Telehealth: Payer: Self-pay

## 2020-03-01 NOTE — Telephone Encounter (Signed)
lmom for overdue inr 

## 2020-03-02 ENCOUNTER — Encounter: Payer: Medicare Other | Admitting: Physical Therapy

## 2020-03-08 ENCOUNTER — Telehealth: Payer: Self-pay

## 2020-03-08 NOTE — Telephone Encounter (Signed)
lmom for overdue inr 

## 2020-03-09 ENCOUNTER — Encounter: Payer: Medicare Other | Admitting: Physical Therapy

## 2020-03-11 ENCOUNTER — Encounter: Payer: Medicare Other | Admitting: Physical Therapy

## 2020-03-18 ENCOUNTER — Ambulatory Visit (INDEPENDENT_AMBULATORY_CARE_PROVIDER_SITE_OTHER): Payer: Medicare Other

## 2020-03-18 ENCOUNTER — Other Ambulatory Visit: Payer: Self-pay

## 2020-03-18 ENCOUNTER — Encounter: Payer: Medicare Other | Admitting: Physical Therapy

## 2020-03-18 DIAGNOSIS — I480 Paroxysmal atrial fibrillation: Secondary | ICD-10-CM

## 2020-03-18 DIAGNOSIS — I4819 Other persistent atrial fibrillation: Secondary | ICD-10-CM

## 2020-03-18 DIAGNOSIS — Z5181 Encounter for therapeutic drug level monitoring: Secondary | ICD-10-CM

## 2020-03-18 LAB — POCT INR: INR: 1.6 — AB (ref 2.0–3.0)

## 2020-03-18 NOTE — Patient Instructions (Signed)
Take 2 tablets today and then Increase to 1 tablet daily except 1/2 tablet  Wednesday . Recheck INR in 4 weeks.  [facility] [phone].

## 2020-03-25 ENCOUNTER — Encounter: Payer: Medicare Other | Admitting: Physical Therapy

## 2020-04-09 ENCOUNTER — Other Ambulatory Visit: Payer: Self-pay | Admitting: Cardiology

## 2020-04-10 ENCOUNTER — Other Ambulatory Visit: Payer: Self-pay | Admitting: Cardiology

## 2020-04-15 ENCOUNTER — Ambulatory Visit (INDEPENDENT_AMBULATORY_CARE_PROVIDER_SITE_OTHER): Payer: Medicare Other

## 2020-04-15 ENCOUNTER — Other Ambulatory Visit: Payer: Self-pay

## 2020-04-15 DIAGNOSIS — I480 Paroxysmal atrial fibrillation: Secondary | ICD-10-CM | POA: Diagnosis not present

## 2020-04-15 DIAGNOSIS — Z5181 Encounter for therapeutic drug level monitoring: Secondary | ICD-10-CM | POA: Diagnosis not present

## 2020-04-15 DIAGNOSIS — I4819 Other persistent atrial fibrillation: Secondary | ICD-10-CM

## 2020-04-15 LAB — POCT INR: INR: 1.3 — AB (ref 2.0–3.0)

## 2020-04-15 NOTE — Patient Instructions (Signed)
Take 2 tablets today and then Increase to 1 tablet daily except 1.5 tablets on Wednesday . Recheck INR in 2 weeks.  [facility] [phone].

## 2020-04-29 ENCOUNTER — Ambulatory Visit (INDEPENDENT_AMBULATORY_CARE_PROVIDER_SITE_OTHER): Payer: Medicare Other

## 2020-04-29 ENCOUNTER — Other Ambulatory Visit: Payer: Self-pay

## 2020-04-29 DIAGNOSIS — I4819 Other persistent atrial fibrillation: Secondary | ICD-10-CM

## 2020-04-29 DIAGNOSIS — Z5181 Encounter for therapeutic drug level monitoring: Secondary | ICD-10-CM | POA: Diagnosis not present

## 2020-04-29 DIAGNOSIS — I480 Paroxysmal atrial fibrillation: Secondary | ICD-10-CM | POA: Diagnosis not present

## 2020-04-29 LAB — POCT INR: INR: 1.5 — AB (ref 2.0–3.0)

## 2020-04-29 NOTE — Patient Instructions (Signed)
Take 2 tablets today and then Increase to 1 tablet daily except 1.5 tablets on Monday, Wednesday, Friday . Recheck INR in 3 weeks.  [facility] [phone].

## 2020-05-08 ENCOUNTER — Other Ambulatory Visit: Payer: Self-pay | Admitting: Cardiology

## 2020-05-20 ENCOUNTER — Ambulatory Visit (INDEPENDENT_AMBULATORY_CARE_PROVIDER_SITE_OTHER): Payer: Medicare Other

## 2020-05-20 ENCOUNTER — Other Ambulatory Visit: Payer: Self-pay

## 2020-05-20 DIAGNOSIS — I4819 Other persistent atrial fibrillation: Secondary | ICD-10-CM | POA: Diagnosis not present

## 2020-05-20 DIAGNOSIS — Z5181 Encounter for therapeutic drug level monitoring: Secondary | ICD-10-CM | POA: Diagnosis not present

## 2020-05-20 DIAGNOSIS — I480 Paroxysmal atrial fibrillation: Secondary | ICD-10-CM

## 2020-05-20 LAB — POCT INR: INR: 1.5 — AB (ref 2.0–3.0)

## 2020-05-20 NOTE — Patient Instructions (Signed)
Increase to 1 tablet daily except 2 tablets on Monday, Wednesday, Friday . Recheck INR in 3 weeks.  [facility] [phone]

## 2020-06-10 ENCOUNTER — Ambulatory Visit (INDEPENDENT_AMBULATORY_CARE_PROVIDER_SITE_OTHER): Payer: Medicare Other

## 2020-06-10 ENCOUNTER — Other Ambulatory Visit: Payer: Self-pay

## 2020-06-10 DIAGNOSIS — I4819 Other persistent atrial fibrillation: Secondary | ICD-10-CM | POA: Diagnosis not present

## 2020-06-10 DIAGNOSIS — Z5181 Encounter for therapeutic drug level monitoring: Secondary | ICD-10-CM | POA: Diagnosis not present

## 2020-06-10 DIAGNOSIS — I480 Paroxysmal atrial fibrillation: Secondary | ICD-10-CM

## 2020-06-10 LAB — POCT INR: INR: 2.4 (ref 2.0–3.0)

## 2020-06-10 NOTE — Patient Instructions (Signed)
Continue taking 1 tablet daily except 2 tablets on Monday, Wednesday, Friday . Recheck INR in 6 weeks.  [facility] #[phone]  

## 2020-06-21 ENCOUNTER — Other Ambulatory Visit: Payer: Self-pay | Admitting: General Practice

## 2020-06-29 ENCOUNTER — Ambulatory Visit (INDEPENDENT_AMBULATORY_CARE_PROVIDER_SITE_OTHER): Payer: Medicare Other | Admitting: Cardiovascular Disease

## 2020-06-29 ENCOUNTER — Encounter: Payer: Self-pay | Admitting: Cardiovascular Disease

## 2020-06-29 ENCOUNTER — Other Ambulatory Visit: Payer: Self-pay

## 2020-06-29 VITALS — BP 125/74 | HR 62 | Ht 67.0 in | Wt 212.0 lb

## 2020-06-29 DIAGNOSIS — I77810 Thoracic aortic ectasia: Secondary | ICD-10-CM | POA: Diagnosis not present

## 2020-06-29 DIAGNOSIS — I340 Nonrheumatic mitral (valve) insufficiency: Secondary | ICD-10-CM | POA: Diagnosis not present

## 2020-06-29 DIAGNOSIS — I5022 Chronic systolic (congestive) heart failure: Secondary | ICD-10-CM

## 2020-06-29 DIAGNOSIS — Z952 Presence of prosthetic heart valve: Secondary | ICD-10-CM

## 2020-06-29 DIAGNOSIS — I480 Paroxysmal atrial fibrillation: Secondary | ICD-10-CM | POA: Diagnosis not present

## 2020-06-29 DIAGNOSIS — Z7901 Long term (current) use of anticoagulants: Secondary | ICD-10-CM | POA: Diagnosis not present

## 2020-06-29 DIAGNOSIS — G4733 Obstructive sleep apnea (adult) (pediatric): Secondary | ICD-10-CM

## 2020-06-29 NOTE — Progress Notes (Signed)
Cardiology Office Note:    Date:  06/29/2020   ID:  [name], DOB 1946/03/09, MRN [id number]  PCP:  Maurice Small, MD  [facility] Cardiologist:  Buford Dresser, MD  [place] Electrophysiologist:  None   Referring MD: Maurice Small, MD   No chief complaint on file.   History of Present Illness:    [name] is a 75 y.o. female with a hx of numerous cardiac problems including chronic combined systolic and diastolic heart failure, history of mechanical aortic valve prosthesis (2006, [place]), permanent atrial fibrillation with history of ablation x2 (most recent 2017) and failure to maintain sinus rhythm on dofetilide and amiodarone, OSA on CPAP, history of hemidiaphragm paralysis following heart surgery, mild aneurysmal ascending aorta (49 mm by June 2021 echo), normal coronary arteries by remote catheterization 2005, a couple of serious bleeding complications including subdural hematoma and 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  The cause of LVEF decline was uncertain, but she responded well to discontinuation of diltiazem and treatment with diuretics, Entresto, carvedilol (the doses have been limited by low blood pressure).  In the past she reportedly did not tolerate beta-blockers due to depression, but the current low-dose of carvedilol seems to be working well for her.  She describes NYHA functional class II exertional dyspnea and has not had dizziness or near syncope.  Denies orthopnea, PND or edema.  Overall is happy with her symptomatology.  Follow-up echocardiogram performed in October 2021 showed only partial improvement  in LVEF now up to 40-45% in the left ventricle remain dilated with an end-systolic diameter of 49 mm.  Mitral regurgitation remains severe with a central jet.  Recently has required a steady increase in her dose of warfarin.  Now she is back in therapeutic range.  She now realizes that she was taking a calcium/vitamin supplement that contains vitamin K.  Today in atrial fibrillation with controlled ventricular response in the 60s.  Has a chronic RBBB.  Chronic T wave inversions in leads V4-V6 are still present, but less prominent.  Past Medical History:  Diagnosis Date  . Anxiety   . Aortic stenosis    status post aortic valve replacement with St. Jude mechanical prosthesis  . 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   35mm by echo 06/2016 and 69mm by CT angin 2016  . Chronic anticoagulation   . 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 Depression   . DJD (degenerative joint disease) of knee   . Dyslipidemia   . GERD (gastroesophageal reflux disease)    "several years ago; none since" (11/12/2012)  . Hyperlipidemia   . Hyperlipidemia LDL goal <70 01/19/2017  . Hypertension   . Left atrial enlargement   . Mitral regurgitation 06/25/2016   Mild moderate MR by echo 06/2016  . Obesity   . Persistent atrial fibrillation (HCC)    s/p afib ablation x 2 at [facility]  . Sleep apnea    On CPAP at 12cm H2O  . 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 - now cleared by neurosurgery to maintain INR 2-2.5    Past Surgical History:  Procedure Laterality Date  .  BURR HOLE FOR SUBDURAL HEMATOMA  2006  . CARDIAC VALVE REPLACEMENT  2006   St. Jude AVR  . CARDIOVERSION N/A 07/22/2012   Procedure: CARDIOVERSION;  Surgeon: [name], MD;  Location: [facility] ENDOSCOPY;  Service: Cardiovascular;  Laterality: N/A;  . CARDIOVERSION N/A 11/25/2012   Procedure: CARDIOVERSION;  Surgeon: [name], MD;  Location: [facility] ENDOSCOPY;  Service:  Cardiovascular;  Laterality: N/A;  . CARDIOVERSION N/A 12/30/2012   Procedure: CARDIOVERSION;  Surgeon: [name], MD;  Location: [facility] ENDOSCOPY;  Service: Cardiovascular;  Laterality: N/A;  h&p in file-HW   . CARDIOVERSION N/A 03/30/2013   Procedure: CARDIOVERSION;  Surgeon: [name], MD;  Location: [facility] ENDOSCOPY;  Service: Cardiovascular;  Laterality: N/A;  . ELECTROPHYSIOLOGIC STUDY  11/2013   Afib ablation x 2 (11/2013 and 10/2015) at [facility] by Dr Clyda Hurdle with recurrence post ablation  . KNEE ARTHROSCOPY Left 1980's   "2" (11/12/2012)  . KNEE SURGERY Left 1980's   "after 2 scopes they went in and did some kind of OR" (11/12/2012)  . TEE WITHOUT CARDIOVERSION N/A 07/22/2012   Procedure: TRANSESOPHAGEAL ECHOCARDIOGRAM (TEE);  Surgeon: [name], MD;  Location: [facility] ENDOSCOPY;  Service: Cardiovascular;  Laterality: N/A;  Rm 2034  . TEE WITHOUT CARDIOVERSION N/A 10/07/2013   Procedure: TRANSESOPHAGEAL ECHOCARDIOGRAM (TEE);  Surgeon: [name], MD;  Location: [facility] ENDOSCOPY;  Service: Cardiovascular;  Laterality: N/A;  . TOTAL KNEE ARTHROPLASTY Left 11/05/2014   Procedure: TOTAL KNEE ARTHROPLASTY;  Surgeon: Dorna Leitz, MD;  Location: [place];  Service: Orthopedics;  Laterality: Left;  . TUBAL LIGATION  1980's    Current Medications: Current Meds  Medication Sig  . acetaminophen (TYLENOL) 500 MG tablet Take 1,000 mg by mouth daily as needed for mild pain.  Marland Kitchen ALPRAZolam (XANAX) 0.5 MG tablet Take 0.5 mg by mouth 2 (two) times daily as needed for anxiety.   . calcium-vitamin D (OSCAL WITH D) 500-200 MG-UNIT per tablet Take 2 tablets by mouth daily.   . carvedilol (COREG) 3.125 MG tablet TAKE 1 TABLET(3.125 MG) BY MOUTH TWICE DAILY WITH A MEAL  . Cholecalciferol (VITAMIN D) 2000 UNITS tablet Take 4,000 Units by mouth daily.  . ferrous sulfate 325 (65 FE) MG tablet Take 325 mg by mouth daily with breakfast.  . furosemide (LASIX) 40 MG tablet Take 1 tablet (40 mg total) by mouth 2 (two) times daily.   Marland Kitchen levothyroxine (SYNTHROID, LEVOTHROID) 100 MCG tablet Take 100 mcg daily before breakfast by mouth.  . Multiple Vitamin (MULTIVITAMIN WITH MINERALS) TABS Take 1 tablet by mouth daily.  Marland Kitchen oxymetazoline (AFRIN) 0.05 % nasal spray Place 1 spray into both nostrils daily as needed for congestion.  . potassium chloride SA (KLOR-CON) 20 MEQ tablet TAKE 2 TABLETS(40 MEQ) BY MOUTH DAILY  . pravastatin (PRAVACHOL) 20 MG tablet TAKE 1 TABLET BY MOUTH EVERY DAY  . sacubitril-valsartan (ENTRESTO) 49-51 MG Take 1 tablet by mouth 2 (two) times daily.  . sertraline (ZOLOFT) 100 MG tablet Take 100 mg by mouth daily.  . traMADol (ULTRAM) 50 MG tablet   . tretinoin (RETIN-A) 0.05 % cream Apply 1 application topically at bedtime.   Marland Kitchen warfarin (COUMADIN) 4 MG tablet TAKE 1/2 TO 1 TABLET BY MOUTH DAILY AS DIRECTED BY COUMADIN CLINIC  . zolpidem (AMBIEN) 10 MG tablet Take 5 mg by mouth at bedtime as needed for sleep.     Allergies:   Codeine, Beta adrenergic blockers, Metoprolol, Propoxyphene, Toprol xl [metoprolol tartrate], and Dilantin [phenytoin sodium extended]   Social  History   Socioeconomic History  . Marital status: Married    Spouse name: Not on file  . Number of children: Not on file  . Years of education: Not on file  . Highest education level: Not on file  Occupational History  . Not on file  Tobacco Use  . Smoking status: Never Smoker  . Smokeless tobacco: Never Used  Substance and Sexual Activity  . Alcohol use: No    Comment: 11/12/2012 "no alcohol in the last 9 years"  . Drug use: No  . Sexual activity: Not Currently  Other Topics Concern  . Not on file  Social History Narrative   Pt lives in [place] with spouse.  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 Days of Exercise per Week: 0 days  . Minutes of Exercise per Session: 0 min  Stress:  Not on file  Social Connections: Unknown  . Frequency of Communication with Friends and Family: Once a week  . Frequency of Social Gatherings with Friends and Family: Not on file  . Attends Religious Services: 1 to 4 times per year  . Active Member of Clubs or Organizations: No  . Attends Archivist Meetings: Never  . Marital Status: Married     Family History: The patient's family history includes Heart disease in her brother; Hyperlipidemia in her father; Hypertension in her father and mother; Lung cancer in her mother.  ROS:   Please see the history of present illness.     All other systems reviewed and are negative.  EKGs/Labs/Other Studies Reviewed:    The following studies were reviewed today: TTE 2020-01-16 1. Systolic Index of contractility has improved from 669 to 865 mmHg/s.Marland Kitchen  Left ventricular ejection fraction, by estimation, is 35 to 40%. Left  ventricular ejection fraction by PLAX is 40 %. The left ventricle has  moderately decreased function. The left  ventricle demonstrates global hypokinesis. The left ventricular internal  cavity size was mildly to moderately dilated. There is mild concentric  left ventricular hypertrophy. Left ventricular diastolic parameters are  indeterminate.  2. Right ventricular systolic function is moderately reduced. The right  ventricular size is moderately enlarged. There is moderately elevated  pulmonary artery systolic pressure. The estimated right ventricular  systolic pressure is 36.6 mmHg.  3. Left atrial size was severely dilated.  4. Right atrial size was severely dilated.  5. Central mitral regurgitation; suspect atrial dilation contributes to  mechanism . The mitral valve is grossly normal. Severe mitral valve  regurgitation.  6. Prosthetic DVI 0.33, AT < 100 ms. The aortic valve was not well  visualized. Aortic valve regurgitation is not visualized. There is a 21 mm  St. Jude St. Jude Reagent mechanical valve  present in the aortic position.  Procedure Date: 2006.  7. Aortic dilatation noted. There is mild dilatation of the aortic root,  measuring 40 mm. There is moderate dilatation of the ascending aorta,  measuring 47 mm.  8. The inferior vena cava is normal in size with <50% respiratory  variability, suggesting right atrial pressure of 8 mmHg.   EKG:  EKG is ordered today.  It shows atrial fibrillation with right bundle branch block and T wave inversion V4-V6  Recent Labs: 09/24/2019: ALT 20; B Natriuretic Peptide 696.5 09/25/2019: Magnesium 1.8 09/30/2019: Hemoglobin 13.0; Platelets 222 02/05/2020: BUN 19; Creatinine, Ser 0.82;  Potassium 4.4; Sodium 138  Recent Lipid Panel    Component Value Date/Time   CHOL 175 05/27/2018 1125   TRIG 100 05/27/2018 1125   HDL 71 05/27/2018 1125   CHOLHDL 2.5 05/27/2018 1125   LDLCALC 84 05/27/2018 1125    Physical Exam:    VS:  BP 125/74   Pulse 62   Ht 5\' 7"  (1.702 m)   Wt 212 lb (96.2 kg)   SpO2 98%   BMI 33.20 kg/m     Wt Readings from Last 3 Encounters:  06/29/20 212 lb (96.2 kg)  01/13/20 206 lb (93.4 kg)  12/31/19 206 lb 12.8 oz (93.8 kg)      General: Alert, oriented x3, no distress, mildly obese Head: no evidence of trauma, PERRL, EOMI, no exophtalmos or lid lag, no myxedema, no xanthelasma; normal ears, nose and oropharynx Neck: normal jugular venous pulsations and no hepatojugular reflux; brisk carotid pulses without delay and no carotid bruits Chest: clear to auscultation, no signs of consolidation by percussion or palpation, normal fremitus, symmetrical and full respiratory excursions Cardiovascular: normal position and quality of the apical impulse, regular rhythm, prosthetic valve clicks, 2/6 aortic ejection murmur is early peaking, 2/6 apical holosystolic murmur with a remarkably limited radiation area, no diastolic murmurs, rubs or gallops Abdomen: no tenderness or distention, no masses by palpation, no abnormal  pulsatility or arterial bruits, normal bowel sounds, no hepatosplenomegaly Extremities: no clubbing, cyanosis or edema; 2+ radial, ulnar and brachial pulses bilaterally; 2+ right femoral, posterior tibial and dorsalis pedis pulses; 2+ left femoral, posterior tibial and dorsalis pedis pulses; no subclavian or femoral bruits Neurological: grossly nonfocal Psych: Normal mood and affect   ASSESSMENT:    1. Chronic systolic heart failure (HCC)   2. Paroxysmal atrial fibrillation ([place])   3. H/O mechanical aortic valve replacement   4. Long term current use of anticoagulant   5. Nonrheumatic mitral valve regurgitation   6. OSA (obstructive sleep apnea)   7. Ascending aorta dilatation (HCC)    PLAN:    In order of problems listed above:  1. CHF: NYHA functional class I and clinically euvolemic on a relatively low dose of loop diuretics and maximum tolerated doses of Entresto and carvedilol.  Maintaining her "dry weight" under 215 pounds.  I do not think she would tolerate additional spironolactone, but it is possible that we could use an SGLT2 inhibitor.  The exact mechanism of the reduction in LVEF is unclear, since her atrial fibrillation appears to be well rate controlled and she does not have any signs or symptoms of coronary disease or any perfusion abnormalities on the nuclear stress test from May 2021.  Despite improvement in symptoms and improved LV systolic function, mitral regurgitation remains severe. 2. AFib: On warfarin anticoagulation for mechanical heart valve.  Severely dilated left and right atrium.  She has had 2 previous ablation procedures and has failed antiarrhythmic therapy with amiodarone and dofetilide.  Continue to manage this with rate control.  Occasionally her rhythm on the ECG appears remarkably regular suggesting there may be a junctional rhythm competing with AV conduction. 3. Mech AVR: Normal gradients across the mechanical prosthesis (mean gradient 10 mmHg for size 19  St. Jude Regent dual leaflet). 4. Anticoagulation: Recently INR has been a little low, probably due to the vitamin K supplement and her calcium supplements, but she has now achieved target anticoagulation range.  As long as she continues to take the same supplement we should have a steady level of  anticoagulation.  She does have a history of serious bleeding complications on 2 occasions (subdural hematoma 2006 and rectus sheath tumor 2019). 5. MR: Will still severe despite improved LVEF and markedly improved symptoms.  Might be a primary valvular abnormality.  Need to consider mitral clip.  We will schedule for TEE.  The risks and benefits of TEE and sedation were discussed in detail and she agrees to proceed. 6. OSA: She reports 100% compliance with CPAP.  She does not have daytime hypersomnolence. 7. Asc Ao dilation: CT scan in 2017 documented an ascending aorta diameter 4.3 cm, most recent echo measured this at 4.7 cm.  TEE should allow [place] to get a more precise measurement, and we should be able to avoid contrast exposure for CT that way.   Medication Adjustments/Labs and Tests Ordered: Current medicines are reviewed at length with the patient today.  Concerns regarding medicines are outlined above.  Orders Placed This Encounter  Procedures  . Basic metabolic panel  . CBC  . Protime-INR  . EKG 12-Lead   No orders of the defined types were placed in this encounter.   Patient Instructions  Medication Instructions:  No changes *If you need a refill on your cardiac medications before your next appointment, please call your pharmacy*  Follow-Up: At [facility], you and your health needs are our priority.  As part of our continuing mission to provide you with exceptional heart care, we have created designated Provider Care Teams.  These Care Teams include your primary Cardiologist (physician) and Advanced Practice Providers (APPs -  Physician Assistants and Nurse Practitioners) who all work  together to provide you with the care you need, when you need it.  We recommend signing up for the patient portal called "MyChart".  Sign up information is provided on this After Visit Summary.  MyChart is used to connect with patients for Virtual Visits (Telemedicine).  Patients are able to view lab/test results, encounter notes, upcoming appointments, etc.  Non-urgent messages can be sent to your provider as well.   To learn more about what you can do with MyChart, go to [email or web address].    Your next appointment:   2-4 weeks after the TEE on 07/12/20  The format for your next appointment:   In Person  Provider:   You may see Sanda Klein, MD or one of the following Advanced Practice Providers on your designated Care Team:    Almyra Deforest, PA-C  Fabian Sharp, [place] or   Roby Lofts, [place]    Other Instructions  You are scheduled for a TEE on 07/12/20 with Dr. Sallyanne Kuster.  Please arrive at the [facility] (Main Entrance A) at [facility]: [address] [place], [place] [address] at 11 am. (1 hour prior to procedure)  DIET: Nothing to eat or drink after midnight except a sip of water with medications (see medication instructions below)  Medication Instructions: Hold Furosemide the morning of the procedure  Continue your anticoagulant: Warfarin You will need to continue your anticoagulant after your procedure until you  are told by your provider that it is safe to stop   Labs:  Your provider would like for you to return on 07/08/20 to have the following labs drawn: CBC, BMET and PT/INR. You do not need an appointment for the lab. Once in our office lobby there is a podium where you can sign in and ring the doorbell to alert [place] that you are here. The lab is open from 8:00 am to  4:30 pm; closed for lunch from 12:45pm-1:45pm.  You will need to have the coronavirus test completed prior to your procedure. An appointment has been made at 2:40 pm on 07/08/20. This is a Drive Up  Visit at [address], [place], [place] [address]. Please tell them that you are there for procedure testing. Stay in your car and someone will be with you shortly. Please make sure to have all other labs completed before this test because you will need to stay quarantined until your procedure.   You must have a responsible person to drive you home and stay in the waiting area during your procedure. Failure to do so could result in cancellation.  Bring your insurance cards.  *Special Note: Every effort is made to have your procedure done on time. Occasionally there are emergencies that occur at the hospital that may cause delays. Please be patient if a delay does occur.       Signed, Sanda Klein, MD  06/29/2020 1:24 PM    [facility]

## 2020-06-29 NOTE — H&P (View-Only) (Signed)
Cardiology Office Note:    Date:  06/29/2020   ID:  [name], DOB [birth year], MRN [id number]  PCP:  Maurice Small, MD  [facility] Cardiologist:  Buford Dresser, MD  [place] Electrophysiologist:  None   Referring MD: Maurice Small, MD   No chief complaint on file.   History of Present Illness:    [name] is a 75 y.o. female with a hx of numerous cardiac problems including chronic combined systolic and diastolic heart failure, history of mechanical aortic valve prosthesis (2006, [place]), permanent atrial fibrillation with history of ablation x2 (most recent 2017) and failure to maintain sinus rhythm on dofetilide and amiodarone, OSA on CPAP, history of hemidiaphragm paralysis following heart surgery, mild aneurysmal ascending aorta (49 mm by June 2021 echo), normal coronary arteries by remote catheterization 2005, a couple of serious bleeding complications including subdural hematoma and 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  The cause of LVEF decline was uncertain, but she responded well to discontinuation of diltiazem and treatment with diuretics, Entresto, carvedilol (the doses have been limited by low blood pressure).  In the past she reportedly did not tolerate beta-blockers due to depression, but the current low-dose of carvedilol seems to be working well for her.  She describes NYHA functional class II exertional dyspnea and has not had dizziness or near syncope.  Denies orthopnea, PND or edema.  Overall is happy with her symptomatology.  Follow-up echocardiogram performed in October 2021 showed only partial improvement  in LVEF now up to 40-45% in the left ventricle remain dilated with an end-systolic diameter of 49 mm.  Mitral regurgitation remains severe with a central jet.  Recently has required a steady increase in her dose of warfarin.  Now she is back in therapeutic range.  She now realizes that she was taking a calcium/vitamin supplement that contains vitamin K.  Today in atrial fibrillation with controlled ventricular response in the 60s.  Has a chronic RBBB.  Chronic T wave inversions in leads V4-V6 are still present, but less prominent.  Past Medical History:  Diagnosis Date  . Anxiety   . Aortic stenosis    status post aortic valve replacement with St. Jude mechanical prosthesis  . 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   32mm by echo 06/2016 and 82mm by CT angin 2016  . Chronic anticoagulation   . 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 Depression   . DJD (degenerative joint disease) of knee   . Dyslipidemia   . GERD (gastroesophageal reflux disease)    "several years ago; none since" (11/12/2012)  . Hyperlipidemia   . Hyperlipidemia LDL goal <70 01/19/2017  . Hypertension   . Left atrial enlargement   . Mitral regurgitation 06/25/2016   Mild moderate MR by echo 06/2016  . Obesity   . Persistent atrial fibrillation (HCC)    s/p afib ablation x 2 at [facility]  . Sleep apnea    On CPAP at 12cm H2O  . 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 - now cleared by neurosurgery to maintain INR 2-2.5    Past Surgical History:  Procedure Laterality Date  .  BURR HOLE FOR SUBDURAL HEMATOMA  2006  . CARDIAC VALVE REPLACEMENT  2006   St. Jude AVR  . CARDIOVERSION N/A 07/22/2012   Procedure: CARDIOVERSION;  Surgeon: [name], MD;  Location: [facility] ENDOSCOPY;  Service: Cardiovascular;  Laterality: N/A;  . CARDIOVERSION N/A 11/25/2012   Procedure: CARDIOVERSION;  Surgeon: [name], MD;  Location: [facility] ENDOSCOPY;  Service:  Cardiovascular;  Laterality: N/A;  . CARDIOVERSION N/A 12/30/2012   Procedure: CARDIOVERSION;  Surgeon: [name], MD;  Location: [facility] ENDOSCOPY;  Service: Cardiovascular;  Laterality: N/A;  h&p in file-HW   . CARDIOVERSION N/A 03/30/2013   Procedure: CARDIOVERSION;  Surgeon: [name], MD;  Location: [facility] ENDOSCOPY;  Service: Cardiovascular;  Laterality: N/A;  . ELECTROPHYSIOLOGIC STUDY  11/2013   Afib ablation x 2 (11/2013 and 10/2015) at [facility] by Dr Clyda Hurdle with recurrence post ablation  . KNEE ARTHROSCOPY Left 1980's   "2" (11/12/2012)  . KNEE SURGERY Left 1980's   "after 2 scopes they went in and did some kind of OR" (11/12/2012)  . TEE WITHOUT CARDIOVERSION N/A 07/22/2012   Procedure: TRANSESOPHAGEAL ECHOCARDIOGRAM (TEE);  Surgeon: [name], MD;  Location: [facility] ENDOSCOPY;  Service: Cardiovascular;  Laterality: N/A;  Rm 2034  . TEE WITHOUT CARDIOVERSION N/A 10/07/2013   Procedure: TRANSESOPHAGEAL ECHOCARDIOGRAM (TEE);  Surgeon: [name], MD;  Location: [facility] ENDOSCOPY;  Service: Cardiovascular;  Laterality: N/A;  . TOTAL KNEE ARTHROPLASTY Left 11/05/2014   Procedure: TOTAL KNEE ARTHROPLASTY;  Surgeon: Dorna Leitz, MD;  Location: [place];  Service: Orthopedics;  Laterality: Left;  . TUBAL LIGATION  1980's    Current Medications: Current Meds  Medication Sig  . acetaminophen (TYLENOL) 500 MG tablet Take 1,000 mg by mouth daily as needed for mild pain.  Marland Kitchen ALPRAZolam (XANAX) 0.5 MG tablet Take 0.5 mg by mouth 2 (two) times daily as needed for anxiety.   . calcium-vitamin D (OSCAL WITH D) 500-200 MG-UNIT per tablet Take 2 tablets by mouth daily.   . carvedilol (COREG) 3.125 MG tablet TAKE 1 TABLET(3.125 MG) BY MOUTH TWICE DAILY WITH A MEAL  . Cholecalciferol (VITAMIN D) 2000 UNITS tablet Take 4,000 Units by mouth daily.  . ferrous sulfate 325 (65 FE) MG tablet Take 325 mg by mouth daily with breakfast.  . furosemide (LASIX) 40 MG tablet Take 1 tablet (40 mg total) by mouth 2 (two) times daily.   Marland Kitchen levothyroxine (SYNTHROID, LEVOTHROID) 100 MCG tablet Take 100 mcg daily before breakfast by mouth.  . Multiple Vitamin (MULTIVITAMIN WITH MINERALS) TABS Take 1 tablet by mouth daily.  Marland Kitchen oxymetazoline (AFRIN) 0.05 % nasal spray Place 1 spray into both nostrils daily as needed for congestion.  . potassium chloride SA (KLOR-CON) 20 MEQ tablet TAKE 2 TABLETS(40 MEQ) BY MOUTH DAILY  . pravastatin (PRAVACHOL) 20 MG tablet TAKE 1 TABLET BY MOUTH EVERY DAY  . sacubitril-valsartan (ENTRESTO) 49-51 MG Take 1 tablet by mouth 2 (two) times daily.  . sertraline (ZOLOFT) 100 MG tablet Take 100 mg by mouth daily.  . traMADol (ULTRAM) 50 MG tablet   . tretinoin (RETIN-A) 0.05 % cream Apply 1 application topically at bedtime.   Marland Kitchen warfarin (COUMADIN) 4 MG tablet TAKE 1/2 TO 1 TABLET BY MOUTH DAILY AS DIRECTED BY COUMADIN CLINIC  . zolpidem (AMBIEN) 10 MG tablet Take 5 mg by mouth at bedtime as needed for sleep.     Allergies:   Codeine, Beta adrenergic blockers, Metoprolol, Propoxyphene, Toprol xl [metoprolol tartrate], and Dilantin [phenytoin sodium extended]   Social  History   Socioeconomic History  . Marital status: Married    Spouse name: Not on file  . Number of children: Not on file  . Years of education: Not on file  . Highest education level: Not on file  Occupational History  . Not on file  Tobacco Use  . Smoking status: Never Smoker  . Smokeless tobacco: Never Used  Substance and Sexual Activity  . Alcohol use: No    Comment: 11/12/2012 "no alcohol in the last 9 years"  . Drug use: No  . Sexual activity: Not Currently  Other Topics Concern  . Not on file  Social History Narrative   Pt lives in [place] with spouse.  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 Days of Exercise per Week: 0 days  . Minutes of Exercise per Session: 0 min  Stress:  Not on file  Social Connections: Unknown  . Frequency of Communication with Friends and Family: Once a week  . Frequency of Social Gatherings with Friends and Family: Not on file  . Attends Religious Services: 1 to 4 times per year  . Active Member of Clubs or Organizations: No  . Attends Archivist Meetings: Never  . Marital Status: Married     Family History: The patient's family history includes Heart disease in her brother; Hyperlipidemia in her father; Hypertension in her father and mother; Lung cancer in her mother.  ROS:   Please see the history of present illness.     All other systems reviewed and are negative.  EKGs/Labs/Other Studies Reviewed:    The following studies were reviewed today: TTE 2020-01-27 1. Systolic Index of contractility has improved from 669 to 865 mmHg/s.Marland Kitchen  Left ventricular ejection fraction, by estimation, is 35 to 40%. Left  ventricular ejection fraction by PLAX is 40 %. The left ventricle has  moderately decreased function. The left  ventricle demonstrates global hypokinesis. The left ventricular internal  cavity size was mildly to moderately dilated. There is mild concentric  left ventricular hypertrophy. Left ventricular diastolic parameters are  indeterminate.  2. Right ventricular systolic function is moderately reduced. The right  ventricular size is moderately enlarged. There is moderately elevated  pulmonary artery systolic pressure. The estimated right ventricular  systolic pressure is 44.0 mmHg.  3. Left atrial size was severely dilated.  4. Right atrial size was severely dilated.  5. Central mitral regurgitation; suspect atrial dilation contributes to  mechanism . The mitral valve is grossly normal. Severe mitral valve  regurgitation.  6. Prosthetic DVI 0.33, AT < 100 ms. The aortic valve was not well  visualized. Aortic valve regurgitation is not visualized. There is a 21 mm  St. Jude St. Jude Reagent mechanical valve  present in the aortic position.  Procedure Date: 2006.  7. Aortic dilatation noted. There is mild dilatation of the aortic root,  measuring 40 mm. There is moderate dilatation of the ascending aorta,  measuring 47 mm.  8. The inferior vena cava is normal in size with <50% respiratory  variability, suggesting right atrial pressure of 8 mmHg.   EKG:  EKG is ordered today.  It shows atrial fibrillation with right bundle branch block and T wave inversion V4-V6  Recent Labs: 09/24/2019: ALT 20; B Natriuretic Peptide 696.5 09/25/2019: Magnesium 1.8 09/30/2019: Hemoglobin 13.0; Platelets 222 02/05/2020: BUN 19; Creatinine, Ser 0.82;  Potassium 4.4; Sodium 138  Recent Lipid Panel    Component Value Date/Time   CHOL 175 05/27/2018 1125   TRIG 100 05/27/2018 1125   HDL 71 05/27/2018 1125   CHOLHDL 2.5 05/27/2018 1125   LDLCALC 84 05/27/2018 1125    Physical Exam:    VS:  BP 125/74   Pulse 62   Ht 5\' 7"  (1.702 m)   Wt 212 lb (96.2 kg)   SpO2 98%   BMI 33.20 kg/m     Wt Readings from Last 3 Encounters:  06/29/20 212 lb (96.2 kg)  01/13/20 206 lb (93.4 kg)  12/31/19 206 lb 12.8 oz (93.8 kg)      General: Alert, oriented x3, no distress, mildly obese Head: no evidence of trauma, PERRL, EOMI, no exophtalmos or lid lag, no myxedema, no xanthelasma; normal ears, nose and oropharynx Neck: normal jugular venous pulsations and no hepatojugular reflux; brisk carotid pulses without delay and no carotid bruits Chest: clear to auscultation, no signs of consolidation by percussion or palpation, normal fremitus, symmetrical and full respiratory excursions Cardiovascular: normal position and quality of the apical impulse, regular rhythm, prosthetic valve clicks, 2/6 aortic ejection murmur is early peaking, 2/6 apical holosystolic murmur with a remarkably limited radiation area, no diastolic murmurs, rubs or gallops Abdomen: no tenderness or distention, no masses by palpation, no abnormal  pulsatility or arterial bruits, normal bowel sounds, no hepatosplenomegaly Extremities: no clubbing, cyanosis or edema; 2+ radial, ulnar and brachial pulses bilaterally; 2+ right femoral, posterior tibial and dorsalis pedis pulses; 2+ left femoral, posterior tibial and dorsalis pedis pulses; no subclavian or femoral bruits Neurological: grossly nonfocal Psych: Normal mood and affect   ASSESSMENT:    1. Chronic systolic heart failure (HCC)   2. Paroxysmal atrial fibrillation ([place])   3. H/O mechanical aortic valve replacement   4. Long term current use of anticoagulant   5. Nonrheumatic mitral valve regurgitation   6. OSA (obstructive sleep apnea)   7. Ascending aorta dilatation (HCC)    PLAN:    In order of problems listed above:  1. CHF: NYHA functional class I and clinically euvolemic on a relatively low dose of loop diuretics and maximum tolerated doses of Entresto and carvedilol.  Maintaining her "dry weight" under 215 pounds.  I do not think she would tolerate additional spironolactone, but it is possible that we could use an SGLT2 inhibitor.  The exact mechanism of the reduction in LVEF is unclear, since her atrial fibrillation appears to be well rate controlled and she does not have any signs or symptoms of coronary disease or any perfusion abnormalities on the nuclear stress test from May 2021.  Despite improvement in symptoms and improved LV systolic function, mitral regurgitation remains severe. 2. AFib: On warfarin anticoagulation for mechanical heart valve.  Severely dilated left and right atrium.  She has had 2 previous ablation procedures and has failed antiarrhythmic therapy with amiodarone and dofetilide.  Continue to manage this with rate control.  Occasionally her rhythm on the ECG appears remarkably regular suggesting there may be a junctional rhythm competing with AV conduction. 3. Mech AVR: Normal gradients across the mechanical prosthesis (mean gradient 10 mmHg for size 19  St. Jude Regent dual leaflet). 4. Anticoagulation: Recently INR has been a little low, probably due to the vitamin K supplement and her calcium supplements, but she has now achieved target anticoagulation range.  As long as she continues to take the same supplement we should have a steady level of  anticoagulation.  She does have a history of serious bleeding complications on 2 occasions (subdural hematoma 2006 and rectus sheath tumor 2019). 5. MR: Will still severe despite improved LVEF and markedly improved symptoms.  Might be a primary valvular abnormality.  Need to consider mitral clip.  We will schedule for TEE.  The risks and benefits of TEE and sedation were discussed in detail and she agrees to proceed. 6. OSA: She reports 100% compliance with CPAP.  She does not have daytime hypersomnolence. 7. Asc Ao dilation: CT scan in 2017 documented an ascending aorta diameter 4.3 cm, most recent echo measured this at 4.7 cm.  TEE should allow [place] to get a more precise measurement, and we should be able to avoid contrast exposure for CT that way.   Medication Adjustments/Labs and Tests Ordered: Current medicines are reviewed at length with the patient today.  Concerns regarding medicines are outlined above.  Orders Placed This Encounter  Procedures  . Basic metabolic panel  . CBC  . Protime-INR  . EKG 12-Lead   No orders of the defined types were placed in this encounter.   Patient Instructions  Medication Instructions:  No changes *If you need a refill on your cardiac medications before your next appointment, please call your pharmacy*  Follow-Up: At [facility], you and your health needs are our priority.  As part of our continuing mission to provide you with exceptional heart care, we have created designated Provider Care Teams.  These Care Teams include your primary Cardiologist (physician) and Advanced Practice Providers (APPs -  Physician Assistants and Nurse Practitioners) who all work  together to provide you with the care you need, when you need it.  We recommend signing up for the patient portal called "MyChart".  Sign up information is provided on this After Visit Summary.  MyChart is used to connect with patients for Virtual Visits (Telemedicine).  Patients are able to view lab/test results, encounter notes, upcoming appointments, etc.  Non-urgent messages can be sent to your provider as well.   To learn more about what you can do with MyChart, go to [email or web address].    Your next appointment:   2-4 weeks after the TEE on 07/12/20  The format for your next appointment:   In Person  Provider:   You may see Sanda Klein, MD or one of the following Advanced Practice Providers on your designated Care Team:    Almyra Deforest, PA-C  Fabian Sharp, [place] or   Roby Lofts, [place]    Other Instructions  You are scheduled for a TEE on 07/12/20 with Dr. Sallyanne Kuster.  Please arrive at the [facility] (Main Entrance A) at [facility]: [address] [place], [place] [address] at 11 am. (1 hour prior to procedure)  DIET: Nothing to eat or drink after midnight except a sip of water with medications (see medication instructions below)  Medication Instructions: Hold Furosemide the morning of the procedure  Continue your anticoagulant: Warfarin You will need to continue your anticoagulant after your procedure until you  are told by your provider that it is safe to stop   Labs:  Your provider would like for you to return on 07/08/20 to have the following labs drawn: CBC, BMET and PT/INR. You do not need an appointment for the lab. Once in our office lobby there is a podium where you can sign in and ring the doorbell to alert [place] that you are here. The lab is open from 8:00 am to  4:30 pm; closed for lunch from 12:45pm-1:45pm.  You will need to have the coronavirus test completed prior to your procedure. An appointment has been made at 2:40 pm on 07/08/20. This is a Drive Up  Visit at [address], [place], [place] [address]. Please tell them that you are there for procedure testing. Stay in your car and someone will be with you shortly. Please make sure to have all other labs completed before this test because you will need to stay quarantined until your procedure.   You must have a responsible person to drive you home and stay in the waiting area during your procedure. Failure to do so could result in cancellation.  Bring your insurance cards.  *Special Note: Every effort is made to have your procedure done on time. Occasionally there are emergencies that occur at the hospital that may cause delays. Please be patient if a delay does occur.       Signed, Sanda Klein, MD  06/29/2020 1:24 PM    [facility]

## 2020-06-29 NOTE — Patient Instructions (Addendum)
Medication Instructions:  No changes *If you need a refill on your cardiac medications before your next appointment, please call your pharmacy*  Follow-Up: At [facility], you and your health needs are our priority.  As part of our continuing mission to provide you with exceptional heart care, we have created designated Provider Care Teams.  These Care Teams include your primary Cardiologist (physician) and Advanced Practice Providers (APPs -  Physician Assistants and Nurse Practitioners) who all work together to provide you with the care you need, when you need it.  We recommend signing up for the patient portal called "MyChart".  Sign up information is provided on this After Visit Summary.  MyChart is used to connect with patients for Virtual Visits (Telemedicine).  Patients are able to view lab/test results, encounter notes, upcoming appointments, etc.  Non-urgent messages can be sent to your provider as well.   To learn more about what you can do with MyChart, go to [email or web address].    Your next appointment:   2-4 weeks after the TEE on 07/12/20  The format for your next appointment:   In Person  Provider:   You may see Sanda Klein, MD or one of the following Advanced Practice Providers on your designated Care Team:    Almyra Deforest, PA-C  Fabian Sharp, [place] or   Roby Lofts, [place]    Other Instructions  You are scheduled for a TEE on 07/12/20 with Dr. Sallyanne Kuster.  Please arrive at the [facility] (Main Entrance A) at [facility]: [address] [place], [place] [address] at 11 am. (1 hour prior to procedure)  DIET: Nothing to eat or drink after midnight except a sip of water with medications (see medication instructions below)  Medication Instructions: Hold Furosemide the morning of the procedure  Continue your anticoagulant: Warfarin You will need to continue your anticoagulant after your procedure until you  are told by your provider that it is safe to  stop   Labs:  Your provider would like for you to return on 07/08/20 to have the following labs drawn: CBC, BMET and PT/INR. You do not need an appointment for the lab. Once in our office lobby there is a podium where you can sign in and ring the doorbell to alert [place] that you are here. The lab is open from 8:00 am to 4:30 pm; closed for lunch from 12:45pm-1:45pm.  You will need to have the coronavirus test completed prior to your procedure. An appointment has been made at 2:40 pm on 07/08/20. This is a Drive Up Visit at [address], [place], [place] [address]. Please tell them that you are there for procedure testing. Stay in your car and someone will be with you shortly. Please make sure to have all other labs completed before this test because you will need to stay quarantined until your procedure.   You must have a responsible person to drive you home and stay in the waiting area during your procedure. Failure to do so could result in cancellation.  Bring your insurance cards.  *Special Note: Every effort is made to have your procedure done on time. Occasionally there are emergencies that occur at the hospital that may cause delays. Please be patient if a delay does occur.

## 2020-07-07 ENCOUNTER — Other Ambulatory Visit: Payer: Self-pay | Admitting: *Deleted

## 2020-07-07 DIAGNOSIS — I340 Nonrheumatic mitral (valve) insufficiency: Secondary | ICD-10-CM

## 2020-07-08 ENCOUNTER — Other Ambulatory Visit (HOSPITAL_COMMUNITY)
Admission: RE | Admit: 2020-07-08 | Discharge: 2020-07-08 | Disposition: A | Discharge status: Home / Self Care | Payer: Medicare Other | Source: Ambulatory Visit | Attending: Cardiovascular Disease | Admitting: Cardiovascular Disease

## 2020-07-08 DIAGNOSIS — Z20822 Contact with and (suspected) exposure to covid-19: Secondary | ICD-10-CM | POA: Insufficient documentation

## 2020-07-08 DIAGNOSIS — Z01812 Encounter for preprocedural laboratory examination: Secondary | ICD-10-CM | POA: Diagnosis not present

## 2020-07-08 DIAGNOSIS — I5022 Chronic systolic (congestive) heart failure: Secondary | ICD-10-CM | POA: Diagnosis not present

## 2020-07-08 DIAGNOSIS — I340 Nonrheumatic mitral (valve) insufficiency: Secondary | ICD-10-CM | POA: Diagnosis not present

## 2020-07-08 DIAGNOSIS — I480 Paroxysmal atrial fibrillation: Secondary | ICD-10-CM | POA: Diagnosis not present

## 2020-07-09 LAB — CBC
Hematocrit: 35.8 % (ref 34.0–46.6)
Hemoglobin: 12.1 g/dL (ref 11.1–15.9)
MCH: 31.3 pg (ref 26.6–33.0)
MCHC: 33.8 g/dL (ref 31.5–35.7)
MCV: 93 fL (ref 79–97)
Platelets: 210 10*3/uL (ref 150–450)
RBC: 3.86 x10E6/uL (ref 3.77–5.28)
RDW: 12.6 % (ref 11.7–15.4)
WBC: 3.8 10*3/uL (ref 3.4–10.8)

## 2020-07-09 LAB — BASIC METABOLIC PANEL
BUN/Creatinine Ratio: 24 (ref 12–28)
BUN: 19 mg/dL (ref 8–27)
CO2: 26 mmol/L (ref 20–29)
Calcium: 9.6 mg/dL (ref 8.7–10.3)
Chloride: 99 mmol/L (ref 96–106)
Creatinine, Ser: 0.8 mg/dL (ref 0.57–1.00)
Glucose: 90 mg/dL (ref 65–99)
Potassium: 4.5 mmol/L (ref 3.5–5.2)
Sodium: 139 mmol/L (ref 134–144)
eGFR: 77 mL/min/{1.73_m2} (ref >59)

## 2020-07-09 LAB — PROTIME-INR
INR: 2.7 — ABNORMAL HIGH (ref 0.9–1.2)
Prothrombin Time: 27 s — ABNORMAL HIGH (ref 9.1–12.0)

## 2020-07-09 LAB — SARS CORONAVIRUS 2 (TAT 6-24 HRS): SARS Coronavirus 2: NEGATIVE

## 2020-07-12 ENCOUNTER — Encounter (HOSPITAL_COMMUNITY)
Admission: RE | Disposition: A | Discharge status: Home / Self Care | Payer: Self-pay | Source: Home / Self Care | Attending: Cardiovascular Disease

## 2020-07-12 ENCOUNTER — Encounter (HOSPITAL_COMMUNITY): Payer: Self-pay | Admitting: Cardiovascular Disease

## 2020-07-12 ENCOUNTER — Other Ambulatory Visit: Payer: Self-pay

## 2020-07-12 ENCOUNTER — Other Ambulatory Visit (HOSPITAL_COMMUNITY): Payer: Self-pay | Admitting: *Deleted

## 2020-07-12 ENCOUNTER — Ambulatory Visit (HOSPITAL_COMMUNITY)
Admission: RE | Admit: 2020-07-12 | Discharge: 2020-07-12 | Disposition: A | Discharge status: Home / Self Care | Payer: Medicare Other | Attending: Cardiovascular Disease | Admitting: Cardiovascular Disease

## 2020-07-12 ENCOUNTER — Ambulatory Visit (HOSPITAL_COMMUNITY): Payer: Medicare Other | Admitting: Certified Registered Nurse Anesthetist

## 2020-07-12 ENCOUNTER — Ambulatory Visit (HOSPITAL_BASED_OUTPATIENT_CLINIC_OR_DEPARTMENT_OTHER): Payer: Medicare Other

## 2020-07-12 DIAGNOSIS — G4733 Obstructive sleep apnea (adult) (pediatric): Secondary | ICD-10-CM | POA: Diagnosis not present

## 2020-07-12 DIAGNOSIS — I361 Nonrheumatic tricuspid (valve) insufficiency: Secondary | ICD-10-CM

## 2020-07-12 DIAGNOSIS — I4821 Permanent atrial fibrillation: Secondary | ICD-10-CM | POA: Insufficient documentation

## 2020-07-12 DIAGNOSIS — I5022 Chronic systolic (congestive) heart failure: Secondary | ICD-10-CM | POA: Diagnosis not present

## 2020-07-12 DIAGNOSIS — Z79899 Other long term (current) drug therapy: Secondary | ICD-10-CM | POA: Diagnosis not present

## 2020-07-12 DIAGNOSIS — I340 Nonrheumatic mitral (valve) insufficiency: Secondary | ICD-10-CM

## 2020-07-12 DIAGNOSIS — Z885 Allergy status to narcotic agent status: Secondary | ICD-10-CM | POA: Diagnosis not present

## 2020-07-12 DIAGNOSIS — Q211 Atrial septal defect: Secondary | ICD-10-CM

## 2020-07-12 DIAGNOSIS — I083 Combined rheumatic disorders of mitral, aortic and tricuspid valves: Secondary | ICD-10-CM | POA: Diagnosis not present

## 2020-07-12 DIAGNOSIS — Z888 Allergy status to other drugs, medicaments and biological substances status: Secondary | ICD-10-CM | POA: Insufficient documentation

## 2020-07-12 DIAGNOSIS — I480 Paroxysmal atrial fibrillation: Secondary | ICD-10-CM | POA: Diagnosis not present

## 2020-07-12 DIAGNOSIS — I700 Atherosclerosis of aorta: Secondary | ICD-10-CM | POA: Insufficient documentation

## 2020-07-12 DIAGNOSIS — Z7901 Long term (current) use of anticoagulants: Secondary | ICD-10-CM | POA: Diagnosis not present

## 2020-07-12 DIAGNOSIS — Z79890 Hormone replacement therapy (postmenopausal): Secondary | ICD-10-CM | POA: Insufficient documentation

## 2020-07-12 DIAGNOSIS — I5043 Acute on chronic combined systolic (congestive) and diastolic (congestive) heart failure: Secondary | ICD-10-CM | POA: Diagnosis not present

## 2020-07-12 DIAGNOSIS — Z952 Presence of prosthetic heart valve: Secondary | ICD-10-CM | POA: Insufficient documentation

## 2020-07-12 DIAGNOSIS — I110 Hypertensive heart disease with heart failure: Secondary | ICD-10-CM | POA: Insufficient documentation

## 2020-07-12 HISTORY — PX: TEE WITHOUT CARDIOVERSION: SHX5443

## 2020-07-12 LAB — ECHO TEE
AR max vel: 2.89 cm2
AV Area VTI: 2.04 cm2
AV Area mean vel: 2.32 cm2
AV Mean grad: 3 mmHg
AV Peak grad: 4.8 mmHg
Ao pk vel: 1.09 m/s
MV M vel: 4.58 m/s
MV Peak grad: 83.9 mmHg
Radius: 0.7 cm
Single Plane A2C EF: 54.5 %
Single Plane A4C EF: 55.9 %

## 2020-07-12 SURGERY — ECHOCARDIOGRAM, TRANSESOPHAGEAL
Anesthesia: Monitor Anesthesia Care

## 2020-07-12 MED ORDER — SODIUM CHLORIDE 0.9 % IV SOLN: INTRAVENOUS | Status: DC

## 2020-07-12 MED ORDER — LIDOCAINE 2% (20 MG/ML) 5 ML SYRINGE
INTRAMUSCULAR | Status: DC | PRN
  Administered 2020-07-12: 60 mg via INTRAVENOUS

## 2020-07-12 MED ORDER — PROPOFOL 500 MG/50ML IV EMUL
INTRAVENOUS | Status: DC | PRN
  Administered 2020-07-12: 75 ug/kg/min via INTRAVENOUS

## 2020-07-12 MED ORDER — PROPOFOL 10 MG/ML IV BOLUS
INTRAVENOUS | Status: DC | PRN
  Administered 2020-07-12 (×2): 20 mg via INTRAVENOUS

## 2020-07-12 NOTE — Anesthesia Procedure Notes (Signed)
Procedure Name: MAC Date/Time: 07/12/2020 12:11 PM Performed by: Candis Shine, CRNA Pre-anesthesia Checklist: Patient identified, Emergency Drugs available, Suction available, Patient being monitored and Timeout performed Patient Re-evaluated:Patient Re-evaluated prior to induction Oxygen Delivery Method: Nasal cannula Dental Injury: Teeth and Oropharynx as per pre-operative assessment

## 2020-07-12 NOTE — Interval H&P Note (Signed)
History and Physical Interval Note:  07/12/2020 12:05 PM  [name]  has presented today for surgery, with the diagnosis of [place].  The various methods of treatment have been discussed with the patient and family. After consideration of risks, benefits and other options for treatment, the patient has consented to  Procedure(s): TRANSESOPHAGEAL ECHOCARDIOGRAM (TEE) (N/A) as a surgical intervention.  The patient's history has been reviewed, patient examined, no change in status, stable for surgery.  I have reviewed the patient's chart and labs.  Questions were answered to the patient's satisfaction.     Azaria Stegman

## 2020-07-12 NOTE — Anesthesia Postprocedure Evaluation (Signed)
Anesthesia Post Note  Patient: [name]  Procedure(s) Performed: TRANSESOPHAGEAL ECHOCARDIOGRAM (TEE) (N/A )     Patient location during evaluation: PACU Anesthesia Type: MAC Level of consciousness: awake and alert and oriented Pain management: pain level controlled Vital Signs Assessment: post-procedure vital signs reviewed and stable Respiratory status: spontaneous breathing, nonlabored ventilation and respiratory function stable Cardiovascular status: blood pressure returned to baseline Postop Assessment: no apparent nausea or vomiting Anesthetic complications: no   No complications documented.  Last Vitals:  Vitals:   07/12/20 1250 07/12/20 1259  BP: 101/75 115/78  Pulse: 97 (!) 101  Resp: 18 15  Temp:    SpO2: 98% 98%    Last Pain:  Vitals:   07/12/20 1259  TempSrc:   PainSc: 0-No pain                 Brennan Bailey

## 2020-07-12 NOTE — Discharge Instructions (Signed)

## 2020-07-12 NOTE — Transfer of Care (Signed)
Immediate Anesthesia Transfer of Care Note  Patient: [name]  Procedure(s) Performed: TRANSESOPHAGEAL ECHOCARDIOGRAM (TEE) (N/A )  Patient Location: Endoscopy Unit  Anesthesia Type:MAC  Level of Consciousness: awake, alert  and oriented  Airway & Oxygen Therapy: Patient Spontanous Breathing  Post-op Assessment: Report given to RN and Post -op Vital signs reviewed and stable  Post vital signs: Reviewed and stable  Last Vitals:  Vitals Value Taken Time  BP 112/65   Temp    Pulse 94   Resp 15   SpO2 95     Last Pain:  Vitals:   07/12/20 1116  TempSrc: Oral  PainSc: 0-No pain         Complications: No complications documented.

## 2020-07-12 NOTE — Op Note (Signed)
INDICATIONS: mitral insufficiency  PROCEDURE:   Informed consent was obtained prior to the procedure. The risks, benefits and alternatives for the procedure were discussed and the patient comprehended these risks.  Risks include, but are not limited to, cough, sore throat, vomiting, nausea, somnolence, esophageal and stomach trauma or perforation, bleeding, low blood pressure, aspiration, pneumonia, infection, trauma to the teeth and death.    During this procedure the patient was administered IV propofol by anesthesiology.  The transesophageal probe was inserted in the esophagus and stomach without difficulty and multiple views were obtained.  The patient was kept under observation until the patient left the procedure room.  The patient left the procedure room in stable condition.   Agitated microbubble saline contrast was not administered.  COMPLICATIONS:    There were no immediate complications.  FINDINGS:  LVEF 45-50%. 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 Pulmonary vein flow reversal is not seen. The left atrium is severely dilated.  RECOMMENDATIONS:     Will discuss indication for MitraClip with the structural heart team.  Time Spent Directly with the Patient:  30 minutes   Austen Oyster 07/12/2020, 12:50 PM

## 2020-07-12 NOTE — Progress Notes (Signed)
  Echocardiogram Echocardiogram Transesophageal has been performed.  Bobbye Charleston 07/12/2020, 1:41 PM

## 2020-07-12 NOTE — Anesthesia Preprocedure Evaluation (Addendum)
Anesthesia Evaluation  Patient identified by MRN, date of birth, ID band Patient awake    Reviewed: Allergy & Precautions, NPO status , Patient's Chart, lab work & pertinent test results, reviewed documented beta blocker date and time   History of Anesthesia Complications Negative for: history of anesthetic complications  Airway Mallampati: II  TM Distance: >3 FB Neck ROM: Full    Dental no notable dental hx.    Pulmonary asthma , sleep apnea ,    Pulmonary exam normal        Cardiovascular hypertension, Pt. on medications and Pt. on home beta blockers +CHF  Normal cardiovascular exam+ dysrhythmias Atrial Fibrillation   S/p AVR  TTE 01/2020: EF 35-40%, global hypokinesis, mild LVH,RV systolic function moderately reduced, moderately elevated PASP, RV systolic pressure is 48.1 mmHg, severe RAE/LAE, severe central MR, suspect atrial dilation contributes to mechanism, mild dilatation of the aortic root, measuring 40 mm, moderate dilatation of the ascending aorta, measuring 47 mm   Neuro/Psych Anxiety Depression negative neurological ROS     GI/Hepatic Neg liver ROS, GERD  ,  Endo/Other  Hypothyroidism   Renal/GU negative Renal ROS  negative genitourinary   Musculoskeletal negative musculoskeletal ROS (+)   Abdominal   Peds  Hematology negative hematology ROS (+)   Anesthesia Other Findings Day of surgery medications reviewed with patient.  Reproductive/Obstetrics negative OB ROS                            Anesthesia Physical Anesthesia Plan  ASA: III  Anesthesia Plan: MAC   Post-op Pain Management:    Induction:   PONV Risk Score and Plan: Treatment may vary due to age or medical condition and Propofol infusion  Airway Management Planned: Natural Airway and Nasal Cannula  Additional Equipment:   Intra-op Plan:   Post-operative Plan:   Informed Consent: I have reviewed the  patients History and Physical, chart, labs and discussed the procedure including the risks, benefits and alternatives for the proposed anesthesia with the patient or authorized representative who has indicated his/her understanding and acceptance.       Plan Discussed with: CRNA  Anesthesia Plan Comments:        Anesthesia Quick Evaluation

## 2020-07-13 ENCOUNTER — Encounter (HOSPITAL_COMMUNITY): Payer: Self-pay | Admitting: Cardiovascular Disease

## 2020-07-20 ENCOUNTER — Encounter: Payer: Self-pay | Admitting: Cardiovascular Disease

## 2020-07-20 ENCOUNTER — Ambulatory Visit (INDEPENDENT_AMBULATORY_CARE_PROVIDER_SITE_OTHER): Payer: Medicare Other

## 2020-07-20 ENCOUNTER — Other Ambulatory Visit: Payer: Self-pay

## 2020-07-20 ENCOUNTER — Ambulatory Visit (INDEPENDENT_AMBULATORY_CARE_PROVIDER_SITE_OTHER): Payer: Medicare Other | Admitting: Cardiovascular Disease

## 2020-07-20 VITALS — BP 126/70 | HR 68 | Ht 67.0 in | Wt 219.2 lb

## 2020-07-20 DIAGNOSIS — I340 Nonrheumatic mitral (valve) insufficiency: Secondary | ICD-10-CM | POA: Diagnosis not present

## 2020-07-20 DIAGNOSIS — I480 Paroxysmal atrial fibrillation: Secondary | ICD-10-CM

## 2020-07-20 DIAGNOSIS — I4819 Other persistent atrial fibrillation: Secondary | ICD-10-CM | POA: Diagnosis not present

## 2020-07-20 DIAGNOSIS — Z952 Presence of prosthetic heart valve: Secondary | ICD-10-CM | POA: Diagnosis not present

## 2020-07-20 DIAGNOSIS — I5022 Chronic systolic (congestive) heart failure: Secondary | ICD-10-CM | POA: Diagnosis not present

## 2020-07-20 DIAGNOSIS — I4821 Permanent atrial fibrillation: Secondary | ICD-10-CM | POA: Diagnosis not present

## 2020-07-20 DIAGNOSIS — G4733 Obstructive sleep apnea (adult) (pediatric): Secondary | ICD-10-CM | POA: Diagnosis not present

## 2020-07-20 DIAGNOSIS — Z5181 Encounter for therapeutic drug level monitoring: Secondary | ICD-10-CM | POA: Diagnosis not present

## 2020-07-20 DIAGNOSIS — I77810 Thoracic aortic ectasia: Secondary | ICD-10-CM | POA: Diagnosis not present

## 2020-07-20 DIAGNOSIS — Z7901 Long term (current) use of anticoagulants: Secondary | ICD-10-CM

## 2020-07-20 LAB — POCT INR: INR: 2.5 (ref 2.0–3.0)

## 2020-07-20 MED ORDER — SACUBITRIL-VALSARTAN 97-103 MG PO TABS: 1.0000 | ORAL_TABLET | Freq: Two times a day (BID) | ORAL | 11 refills | Status: DC

## 2020-07-20 MED ORDER — POTASSIUM CHLORIDE CRYS ER 20 MEQ PO TBCR: EXTENDED_RELEASE_TABLET | ORAL | 6 refills | Status: DC

## 2020-07-20 MED ORDER — FUROSEMIDE 40 MG PO TABS: 40.0000 mg | ORAL_TABLET | Freq: Two times a day (BID) | ORAL | 2 refills | Status: DC

## 2020-07-20 NOTE — Patient Instructions (Signed)
Continue taking 1 tablet daily except 2 tablets on Monday, Wednesday, Friday . Recheck INR in 6 weeks.  [facility] [phone]

## 2020-07-20 NOTE — Progress Notes (Signed)
Cardiology Office Note:    Date:  07/22/2020   ID:  [name], DOB 06-09-1945, MRN [id number]  PCP:  Maurice Small, MD  [facility] Cardiologist:  Buford Dresser, MD  [place] Electrophysiologist:  None   Referring MD: Maurice Small, MD   Chief Complaint  Patient presents with  . Cardiac Valve Problem    History of Present Illness:    [name] is a 75 y.o. female with a hx of numerous cardiac problems including chronic combined systolic and diastolic heart failure, mitral insufficiency, history of mechanical aortic valve prosthesis (2006, St Jude), permanent atrial fibrillation with history of ablation x2 (most recent 2017) and failure to maintain sinus rhythm on dofetilide and amiodarone, OSA on CPAP, history of hemidiaphragm paralysis following heart surgery, mild aneurysmal ascending aorta (49 mm by June 2021 echo), normal coronary arteries by remote catheterization 2005, a couple of serious bleeding complications including subdural hematoma and 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  The cause of LVEF decline was uncertain, but she responded well to discontinuation of diltiazem and treatment with diuretics, Entresto, carvedilol (the doses have been limited by low blood pressure).  In the past she reportedly did not tolerate beta-blockers due to depression, but the current low-dose of carvedilol seems to be working well for her. Follow-up echocardiogram performed in October 2021 showed only partial improvement in LVEF now up to 40-45% in the left ventricle remain dilated with an end-systolic diameter of 49 mm.  Mitral  regurgitation remains severe with a central jet.  She underwent a TEE on 07/12/2020 that shows further improvement in LVEF which is now almost normal at 50-55%.  It also showed that the mitral regurgitation is central and squarely in the moderate to severe range (there was no reversal of flow in the pulmonary veins, all calculated parameters suggested higher and of moderate MR).  Anatomically, the valve appears appropriate for MitraClip, without major leaflet retraction and with a mean gradient of 3 mmHg at a heart rate around 100 bpm.  She has noticed slight worsening of her dyspnea since her last appointment, NYHA functional class II-3.  She has gained about 6 pounds.  She has noticed that her heart rate is often rather fast at night and she feels short of breath when she wakes up in the morning.  She does not have edema.  Unclear whether she has any PND.  She has not had any hypotension, dizziness or syncope.  She has not had any bleeding problems and warfarin anticoagulation has been in therapeutic range.  Today INR is 2.5.  Her last subtherapeutic INR was back in February (inadvertently, she started taking a multivitamin that contains vitamin K).  Past Medical History:  Diagnosis Date  . Anxiety   . Aortic stenosis    status post aortic valve replacement with St. Jude mechanical prosthesis  . 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   75mm by echo 06/2016 and 20mm by CT angin 2016  . Chronic anticoagulation   . 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 Depression   . DJD (degenerative joint disease) of knee   . Dyslipidemia   . GERD (gastroesophageal reflux disease)    "  several years ago; none since" (11/12/2012)  . Hyperlipidemia   . Hyperlipidemia LDL goal <70 01/19/2017  . Hypertension   . Left atrial enlargement   . Mitral regurgitation 06/25/2016   Mild moderate MR by echo 06/2016  . Obesity   . Persistent atrial fibrillation (HCC)    s/p afib  ablation x 2 at [facility]  . Sleep apnea    On CPAP at 12cm H2O  . 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 - now cleared by neurosurgery to maintain INR 2-2.5    Past Surgical History:  Procedure Laterality Date  . BURR HOLE FOR SUBDURAL HEMATOMA  2006  . CARDIAC VALVE REPLACEMENT  2006   St. Jude AVR  . CARDIOVERSION N/A 07/22/2012   Procedure: CARDIOVERSION;  Surgeon: [name], MD;  Location: [facility] ENDOSCOPY;  Service: Cardiovascular;  Laterality: N/A;  . CARDIOVERSION N/A 11/25/2012   Procedure: CARDIOVERSION;  Surgeon: [name], MD;  Location: [facility] ENDOSCOPY;  Service: Cardiovascular;  Laterality: N/A;  . CARDIOVERSION N/A 12/30/2012   Procedure: CARDIOVERSION;  Surgeon: [name], MD;  Location: [facility] ENDOSCOPY;  Service: Cardiovascular;  Laterality: N/A;  h&p in file-HW   . CARDIOVERSION N/A 03/30/2013   Procedure: CARDIOVERSION;  Surgeon: [name], MD;  Location: [facility] ENDOSCOPY;  Service: Cardiovascular;  Laterality: N/A;  . ELECTROPHYSIOLOGIC STUDY  11/2013   Afib ablation x 2 (11/2013 and 10/2015) at [facility] by Dr Clyda Hurdle with recurrence post ablation  . KNEE ARTHROSCOPY Left 1980's   "2" (11/12/2012)  . KNEE SURGERY Left 1980's   "after 2 scopes they went in and did some kind of OR" (11/12/2012)  . TEE WITHOUT CARDIOVERSION N/A 07/22/2012   Procedure: TRANSESOPHAGEAL ECHOCARDIOGRAM (TEE);  Surgeon: [name], MD;  Location: [facility] ENDOSCOPY;  Service: Cardiovascular;  Laterality: N/A;  Rm 2034  . TEE WITHOUT CARDIOVERSION N/A 10/07/2013   Procedure: TRANSESOPHAGEAL ECHOCARDIOGRAM (TEE);  Surgeon: [name], MD;  Location: [facility] ENDOSCOPY;  Service: Cardiovascular;  Laterality: N/A;  . TEE WITHOUT CARDIOVERSION N/A 07/12/2020   Procedure: TRANSESOPHAGEAL ECHOCARDIOGRAM (TEE);  Surgeon: Sanda Klein, MD;  Location: [place];  Service: Cardiovascular;  Laterality: N/A;  . TOTAL KNEE ARTHROPLASTY Left 11/05/2014   Procedure: TOTAL KNEE ARTHROPLASTY;   Surgeon: Dorna Leitz, MD;  Location: [place];  Service: Orthopedics;  Laterality: Left;  . TUBAL LIGATION  1980's    Current Medications: Current Meds  Medication Sig  . acetaminophen (TYLENOL) 500 MG tablet Take 1,000 mg by mouth daily as needed for mild pain.  Marland Kitchen ALPRAZolam (XANAX) 0.5 MG tablet Take 0.5 mg by mouth 2 (two) times daily as needed for anxiety.   Marland Kitchen amoxicillin (AMOXIL) 500 MG capsule Prior to dentist.  . calcium-vitamin D (OSCAL WITH D) 500-200 MG-UNIT per tablet Take 2 tablets by mouth daily.   . carvedilol (COREG) 3.125 MG tablet TAKE 1 TABLET(3.125 MG) BY MOUTH TWICE DAILY WITH A MEAL  . Cholecalciferol (VITAMIN D) 2000 UNITS tablet Take 4,000 Units by mouth daily.  . ferrous sulfate 325 (65 FE) MG tablet Take 325 mg by mouth daily with breakfast.  . levothyroxine (SYNTHROID, LEVOTHROID) 100 MCG tablet Take 100 mcg daily before breakfast by mouth.  . Multiple Vitamin (MULTIVITAMIN WITH MINERALS) TABS Take 1 tablet by mouth daily.  Marland Kitchen oxymetazoline (AFRIN) 0.05 % nasal spray Place 1 spray into both nostrils daily as needed for congestion.  . pravastatin (PRAVACHOL) 20 MG tablet TAKE 1 TABLET BY MOUTH EVERY  DAY  . sacubitril-valsartan (ENTRESTO) 97-103 MG Take 1 tablet by mouth 2 (two) times daily.  . sertraline (ZOLOFT) 100 MG tablet Take 100 mg by mouth daily.  . traMADol (ULTRAM) 50 MG tablet Take 50 mg by mouth every 12 (twelve) hours as needed for severe pain.  Marland Kitchen tretinoin (RETIN-A) 0.05 % cream Apply 1 application topically at bedtime.   Marland Kitchen warfarin (COUMADIN) 4 MG tablet TAKE 1/2 TO 1 TABLET BY MOUTH DAILY AS DIRECTED BY COUMADIN CLINIC (Patient taking differently: Take 4-8 mg by mouth See admin instructions. TAKE 1/2 TO 1 TABLET BY MOUTH DAILY AS DIRECTED BY COUMADIN CLINIC 8 mg Mon Wed Fri, 4 mg all other days)  . zolpidem (AMBIEN) 10 MG tablet Take 5 mg by mouth at bedtime as needed for sleep.  . [DISCONTINUED] furosemide (LASIX) 40 MG tablet Take 1 tablet (40 mg  total) by mouth 2 (two) times daily.  . [DISCONTINUED] potassium chloride SA (KLOR-CON) 20 MEQ tablet TAKE 2 TABLETS(40 MEQ) BY MOUTH DAILY  . [DISCONTINUED] sacubitril-valsartan (ENTRESTO) 49-51 MG Take 1 tablet by mouth 2 (two) times daily.     Allergies:   Codeine, Metoprolol, Propoxyphene, and Dilantin [phenytoin sodium extended]   Social History   Socioeconomic History  . Marital status: Married    Spouse name: Not on file  . Number of children: Not on file  . Years of education: Not on file  . Highest education level: Not on file  Occupational History  . Not on file  Tobacco Use  . Smoking status: Never Smoker  . Smokeless tobacco: Never Used  Substance and Sexual Activity  . Alcohol use: No    Comment: 11/12/2012 "no alcohol in the last 9 years"  . Drug use: No  . Sexual activity: Not Currently  Other Topics Concern  . Not on file  Social History Narrative   Pt lives in [place] with spouse.  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 Days of Exercise per Week: 0 days  . Minutes of Exercise per Session: 0 min  Stress: Not on file  Social Connections: Unknown  . Frequency of Communication with Friends and Family: Once a week  . Frequency of Social Gatherings with Friends and Family: Not on file  . Attends Religious Services: 1 to 4 times per year  . Active Member of Clubs or Organizations: No  . Attends Archivist Meetings: Never  . Marital Status: Married     Family History: The patient's family history includes Heart disease in her brother; Hyperlipidemia in her father; Hypertension in her father and mother; Lung cancer in her mother.  ROS:   Please see the history of present illness.     All other systems reviewed and are negative.  EKGs/Labs/Other Studies Reviewed:    The following studies were reviewed  today: TTE 01/14/2020 1. Systolic Index of contractility has improved from 669 to 865 mmHg/s.Marland Kitchen  Left ventricular ejection fraction, by estimation, is 35 to 40%. Left  ventricular ejection fraction by PLAX is 40 %. The left ventricle has  moderately decreased function. The left  ventricle demonstrates global hypokinesis. The left ventricular internal  cavity size was mildly to moderately dilated. There is mild concentric  left ventricular hypertrophy. Left ventricular diastolic parameters are  indeterminate.  2. Right ventricular systolic function is moderately reduced. The right  ventricular size is moderately enlarged. There is moderately elevated  pulmonary artery systolic pressure. The estimated right ventricular  systolic pressure is 40.9 mmHg.  3. Left atrial size was severely dilated.  4. Right atrial size was severely dilated.  5. Central mitral regurgitation; suspect atrial dilation contributes to  mechanism . The mitral valve is grossly normal. Severe mitral valve  regurgitation.  6. Prosthetic DVI 0.33, AT < 100 ms. The aortic valve was not well  visualized. Aortic valve regurgitation is not visualized. There is a 21 mm  St. Jude St. Jude Reagent mechanical valve present in the aortic position.  Procedure Date: 2006.  7. Aortic dilatation noted. There is mild dilatation of the aortic root,  measuring 40 mm. There is moderate dilatation of the ascending aorta,  measuring 47 mm.  8. The inferior vena cava is normal in size with <50% respiratory  variability, suggesting right atrial pressure of 8 mmHg.   TEE 07/12/2020 1. Left ventricular ejection fraction, by estimation, is 50 to 55%. The  left ventricle has low normal function. The left ventricle demonstrates  global hypokinesis. Left ventricular diastolic function could not be  evaluated.  2. Left atrial size was severely dilated. No left atrial/left atrial  appendage thrombus was detected. The LAA emptying  velocity was 50 cm/s.  (the rhythm was atrial flutter).  3. Right atrial size was severely dilated.  4. The mitral valve is rheumatic. Moderate to severe mitral valve  regurgitation. No evidence of mitral stenosis. The mean mitral valve  gradient is 3.0 mmHg.  5. Tricuspid valve regurgitation is moderate.  6. The aortic valve has been repaired/replaced. Aortic valve  regurgitation is trivial. No aortic stenosis is present. There is a 19 mm  mechanical valve present in the aortic position. Echo findings are  consistent with normal structure and function of  the aortic valve prosthesis. Aortic valve mean gradient measures 3.0 mmHg.  Aortic valve Vmax measures 1.09 m/s.  7. Aortic dilatation noted. There is mild dilatation of the ascending  aorta, measuring 40 mm. There is Moderate (Grade III) atheroma plaque  involving the descending aorta.  8. Evidence of atrial level shunting detected by color flow Doppler.  There is a small patent foramen ovale with predominantly left to right  shunting across the atrial septum.  9. Right ventricular systolic function is normal. The right ventricular  size is normal. There is mildly elevated pulmonary artery systolic  pressure.   EKG:  EKG is ordered today.  It shows atrial fibrillation with right bundle branch block and T wave inversion V4-V6  Recent Labs: 09/24/2019: ALT 20; B Natriuretic Peptide 696.5 09/25/2019: Magnesium 1.8 07/08/2020: BUN 19; Creatinine, Ser 0.80; Hemoglobin 12.1; Platelets 210; Potassium 4.5; Sodium 139  Recent Lipid Panel    Component Value Date/Time   CHOL 175 05/27/2018 1125   TRIG 100 05/27/2018 1125   HDL 71 05/27/2018 1125   CHOLHDL 2.5 05/27/2018 1125   LDLCALC 84 05/27/2018 1125    Physical Exam:    VS:  BP 126/70 (BP Location: Left Arm, Patient Position: Sitting)   Pulse 68   Ht 5\' 7"  (1.702 m)   Wt 219 lb 3.2 oz (99.4 kg)   SpO2 97%   BMI 34.33 kg/m     Wt Readings from Last 3 Encounters:   07/20/20 219 lb 3.2 oz (99.4 kg)  07/12/20 213 lb (96.6 kg)  06/29/20 212 lb (96.2 kg)      General: Alert, oriented x3, no distress, mildly  obese Head: no evidence of trauma, PERRL, EOMI, no exophtalmos or lid lag, no myxedema, no xanthelasma; normal ears, nose and oropharynx Neck: normal jugular venous pulsations and no hepatojugular reflux; brisk carotid pulses without delay and no carotid bruits Chest: clear to auscultation, no signs of consolidation by percussion or palpation, normal fremitus, symmetrical and full respiratory excursions Cardiovascular: normal position and quality of the apical impulse, regular rhythm, prosthetic valve clicks, 2/6 aortic ejection murmur is early peaking, 2/6 apical holosystolic murmur with a remarkably limited radiation area, no diastolic murmurs, rubs or gallops Abdomen: no tenderness or distention, no masses by palpation, no abnormal pulsatility or arterial bruits, normal bowel sounds, no hepatosplenomegaly Extremities: no clubbing, cyanosis or edema; 2+ radial, ulnar and brachial pulses bilaterally; 2+ right femoral, posterior tibial and dorsalis pedis pulses; 2+ left femoral, posterior tibial and dorsalis pedis pulses; no subclavian or femoral bruits Neurological: grossly nonfocal Psych: Normal mood and affect   ASSESSMENT:    1. Chronic systolic heart failure ([place])   2. Mitral valve insufficiency, unspecified etiology   3. Permanent atrial fibrillation ([place])   4. H/O mechanical aortic valve replacement   5. Long term current use of anticoagulant   6. OSA (obstructive sleep apnea)   7. Ascending aorta dilatation (HCC)    PLAN:    In order of problems listed above:  1. CHF: Slight worsening of functional status and she is at least 4 pounds above our estimated "dry weight", 6 pounds up from her previous appointment.  We will increase the dose of furosemide and also increase the Entresto to the maximum dose.  Interestingly, she is got more blood  pressure for [place] to work with him now and she might even tolerate spironolactone, also could use an SGLT2 inhibitor.  The exact mechanism of the reduction in LVEF is unclear, since her atrial fibrillation appears to be well rate controlled and she does not have any signs or symptoms of coronary disease or any perfusion abnormalities on the nuclear stress test from May 2021.   2. MR: Although there is clear improvement in her cardiomyopathy, the MR remains moderate to severe.  She made it clear today that she is willing to undergo invasive procedures of this will improve her quality of life, which is her biggest priority.  I think we probably need to reserve judgment as to the utility of a mitraclip until we see the effects of maximum medical therapy for heart failure, but we will plan to discuss her TEE findings in the neck structural heart meeting. 3. AFib: Well rate controlled today.  She reports increased heart rate at night which is likely an expression of hypervolemia.  On warfarin anticoagulation for mechanical heart valve.  Severely dilated left and right atrium.  She has had 2 previous ablation procedures and has failed antiarrhythmic therapy with amiodarone and dofetilide.  Continue to manage this with rate control.  Occasionally her rhythm on the ECG appears remarkably regular suggesting there may be a junctional rhythm competing with AV conduction. 4. Mech AVR: Normal gradients across the mechanical prosthesis (mean gradient 10 mmHg for size 19 St. Jude Regent dual leaflet). 5. Anticoagulation: INR has been steady in the therapeutic range on the last 3 assays.  She does have a history of serious bleeding complications on 2 occasions (subdural hematoma 2006 and rectus sheath tumor 2019). 6. OSA: She reports 100% compliance with CPAP.  She does not have daytime hypersomnolence. 7. Asc Ao dilation: CT scan in 2017 documented an  ascending aorta diameter 4.3 cm, most recent transthoracic echo measured  this at 4.7 cm.   The TEE did not confirm that, measuring a maximum diameter of only 4.0 cm.   Medication Adjustments/Labs and Tests Ordered: Current medicines are reviewed at length with the patient today.  Concerns regarding medicines are outlined above.  Orders Placed This Encounter  Procedures  . Basic metabolic panel   Meds ordered this encounter  Medications  . sacubitril-valsartan (ENTRESTO) 97-103 MG    Sig: Take 1 tablet by mouth 2 (two) times daily.    Dispense:  60 tablet    Refill:  11  . furosemide (LASIX) 40 MG tablet    Sig: Take 1 tablet (40 mg total) by mouth 2 (two) times daily. For weights above 212 pounds, take 80mg  in the morning and 40mg  in the evening.    Dispense:  45 tablet    Refill:  2  . potassium chloride SA (KLOR-CON) 20 MEQ tablet    Sig: TAKE 2 TABLETS(40 MEQ) BY MOUTH DAILY    Dispense:  60 tablet    Refill:  6    Patient Instructions  Medication Instructions:  INCREASE Entresto to 97-103mg  twice daily.   Furosemide: For weights over 212 pounds, take 80mg  of Furosemide in the morning and 40mg  in the evening. For weights NOT over 212 pounds, take the typical 40mg  twice daily.   *If you need a refill on your cardiac medications before your next appointment, please call your pharmacy*   Lab Work: BMP to be drawn next coumadin clinic.  If you have labs (blood work) drawn today and your tests are completely normal, you will receive your results only by: Marland Kitchen MyChart Message (if you have MyChart) OR . A paper copy in the mail If you have any lab test that is abnormal or we need to change your treatment, we will call you to review the results.   Testing/Procedures: None ordered   Follow-Up: At [facility], you and your health needs are our priority.  As part of our continuing mission to provide you with exceptional heart care, we have created designated Provider Care Teams.  These Care Teams include your primary Cardiologist (physician) and  Advanced Practice Providers (APPs -  Physician Assistants and Nurse Practitioners) who all work together to provide you with the care you need, when you need it.  We recommend signing up for the patient portal called "MyChart".  Sign up information is provided on this After Visit Summary.  MyChart is used to connect with patients for Virtual Visits (Telemedicine).  Patients are able to view lab/test results, encounter notes, upcoming appointments, etc.  Non-urgent messages can be sent to your provider as well.   To learn more about what you can do with MyChart, go to [email or web address].    Your next appointment:   3 month(s)  The format for your next appointment:   In Person  Provider:   Sanda Klein, MD        Signed, Sanda Klein, MD  07/22/2020 10:55 AM    [place]

## 2020-07-20 NOTE — Patient Instructions (Signed)
Medication Instructions:  INCREASE Entresto to 97-103mg  twice daily.   Furosemide: For weights over 212 pounds, take 80mg  of Furosemide in the morning and 40mg  in the evening. For weights NOT over 212 pounds, take the typical 40mg  twice daily.   *If you need a refill on your cardiac medications before your next appointment, please call your pharmacy*   Lab Work: BMP to be drawn next coumadin clinic.  If you have labs (blood work) drawn today and your tests are completely normal, you will receive your results only by: Marland Kitchen MyChart Message (if you have MyChart) OR . A paper copy in the mail If you have any lab test that is abnormal or we need to change your treatment, we will call you to review the results.   Testing/Procedures: None ordered   Follow-Up: At [facility], you and your health needs are our priority.  As part of our continuing mission to provide you with exceptional heart care, we have created designated Provider Care Teams.  These Care Teams include your primary Cardiologist (physician) and Advanced Practice Providers (APPs -  Physician Assistants and Nurse Practitioners) who all work together to provide you with the care you need, when you need it.  We recommend signing up for the patient portal called "MyChart".  Sign up information is provided on this After Visit Summary.  MyChart is used to connect with patients for Virtual Visits (Telemedicine).  Patients are able to view lab/test results, encounter notes, upcoming appointments, etc.  Non-urgent messages can be sent to your provider as well.   To learn more about what you can do with MyChart, go to [email or web address].    Your next appointment:   3 month(s)  The format for your next appointment:   In Person  Provider:   Sanda Klein, MD

## 2020-07-22 ENCOUNTER — Encounter: Payer: Self-pay | Admitting: Cardiovascular Disease

## 2020-07-26 NOTE — Telephone Encounter (Signed)
Please switch to me as primary Cardiologist

## 2020-07-27 DIAGNOSIS — H25013 Cortical age-related cataract, bilateral: Secondary | ICD-10-CM | POA: Diagnosis not present

## 2020-08-02 NOTE — Progress Notes (Signed)
[name] DOB Jun 08, 1945  This looks like a clippable valve. The fossa looks approachable for transseptal puncture in both the SAXB and Bicaval views. LA dimensions are large enough for device steering and straddle. The MR jet is broad based and centrally located. The posterior leaflet is measuring 1 cm in the 142 LVOT view. Gradient is 3 mmHG. I would like to verify the gradient and MVA in the case prior to start. Based on the functional nature of the MR, I'd recommend starting with an NTW and assessing for gradient.

## 2020-08-02 NOTE — Progress Notes (Signed)
thx

## 2020-08-08 ENCOUNTER — Other Ambulatory Visit: Payer: Self-pay

## 2020-08-08 MED ORDER — WARFARIN SODIUM 4 MG PO TABS: 4.0000 mg | ORAL_TABLET | ORAL | 0 refills | Status: DC

## 2020-08-08 MED ORDER — WARFARIN SODIUM 4 MG PO TABS: ORAL_TABLET | ORAL | 0 refills | Status: DC

## 2020-08-12 ENCOUNTER — Institutional Professional Consult (permissible substitution): Payer: Medicare Other | Admitting: Cardiovascular Disease

## 2020-08-14 ENCOUNTER — Emergency Department (HOSPITAL_COMMUNITY): Payer: Medicare Other

## 2020-08-14 ENCOUNTER — Encounter (HOSPITAL_COMMUNITY): Payer: Self-pay

## 2020-08-14 ENCOUNTER — Inpatient Hospital Stay (HOSPITAL_COMMUNITY)
Admission: EM | Admit: 2020-08-14 | Discharge: 2020-08-18 | DRG: 872 | Disposition: A | Discharge status: Home / Self Care | Payer: Medicare Other | Attending: Internal Medicine | Admitting: Internal Medicine

## 2020-08-14 ENCOUNTER — Other Ambulatory Visit: Payer: Self-pay

## 2020-08-14 DIAGNOSIS — Z79890 Hormone replacement therapy (postmenopausal): Secondary | ICD-10-CM | POA: Diagnosis not present

## 2020-08-14 DIAGNOSIS — R6520 Severe sepsis without septic shock: Secondary | ICD-10-CM | POA: Diagnosis not present

## 2020-08-14 DIAGNOSIS — Z888 Allergy status to other drugs, medicaments and biological substances status: Secondary | ICD-10-CM | POA: Diagnosis not present

## 2020-08-14 DIAGNOSIS — E785 Hyperlipidemia, unspecified: Secondary | ICD-10-CM | POA: Diagnosis present

## 2020-08-14 DIAGNOSIS — R609 Edema, unspecified: Secondary | ICD-10-CM | POA: Diagnosis not present

## 2020-08-14 DIAGNOSIS — I77810 Thoracic aortic ectasia: Secondary | ICD-10-CM | POA: Diagnosis present

## 2020-08-14 DIAGNOSIS — R791 Abnormal coagulation profile: Secondary | ICD-10-CM | POA: Diagnosis present

## 2020-08-14 DIAGNOSIS — Z20822 Contact with and (suspected) exposure to covid-19: Secondary | ICD-10-CM | POA: Diagnosis present

## 2020-08-14 DIAGNOSIS — Z6832 Body mass index (BMI) 32.0-32.9, adult: Secondary | ICD-10-CM | POA: Diagnosis not present

## 2020-08-14 DIAGNOSIS — I472 Ventricular tachycardia: Secondary | ICD-10-CM | POA: Diagnosis not present

## 2020-08-14 DIAGNOSIS — K7200 Acute and subacute hepatic failure without coma: Secondary | ICD-10-CM

## 2020-08-14 DIAGNOSIS — Z952 Presence of prosthetic heart valve: Secondary | ICD-10-CM

## 2020-08-14 DIAGNOSIS — I712 Thoracic aortic aneurysm, without rupture: Secondary | ICD-10-CM | POA: Diagnosis not present

## 2020-08-14 DIAGNOSIS — I5042 Chronic combined systolic (congestive) and diastolic (congestive) heart failure: Secondary | ICD-10-CM | POA: Diagnosis present

## 2020-08-14 DIAGNOSIS — I4821 Permanent atrial fibrillation: Secondary | ICD-10-CM | POA: Diagnosis present

## 2020-08-14 DIAGNOSIS — I340 Nonrheumatic mitral (valve) insufficiency: Secondary | ICD-10-CM | POA: Diagnosis present

## 2020-08-14 DIAGNOSIS — R197 Diarrhea, unspecified: Secondary | ICD-10-CM | POA: Diagnosis present

## 2020-08-14 DIAGNOSIS — Z79899 Other long term (current) drug therapy: Secondary | ICD-10-CM | POA: Diagnosis not present

## 2020-08-14 DIAGNOSIS — I272 Pulmonary hypertension, unspecified: Secondary | ICD-10-CM | POA: Diagnosis present

## 2020-08-14 DIAGNOSIS — Z96652 Presence of left artificial knee joint: Secondary | ICD-10-CM | POA: Diagnosis present

## 2020-08-14 DIAGNOSIS — F419 Anxiety disorder, unspecified: Secondary | ICD-10-CM | POA: Diagnosis present

## 2020-08-14 DIAGNOSIS — Z8249 Family history of ischemic heart disease and other diseases of the circulatory system: Secondary | ICD-10-CM

## 2020-08-14 DIAGNOSIS — G4733 Obstructive sleep apnea (adult) (pediatric): Secondary | ICD-10-CM | POA: Diagnosis present

## 2020-08-14 DIAGNOSIS — Z7901 Long term (current) use of anticoagulants: Secondary | ICD-10-CM

## 2020-08-14 DIAGNOSIS — J45909 Unspecified asthma, uncomplicated: Secondary | ICD-10-CM | POA: Diagnosis present

## 2020-08-14 DIAGNOSIS — A419 Sepsis, unspecified organism: Principal | ICD-10-CM | POA: Diagnosis present

## 2020-08-14 DIAGNOSIS — I110 Hypertensive heart disease with heart failure: Secondary | ICD-10-CM | POA: Diagnosis present

## 2020-08-14 DIAGNOSIS — Z885 Allergy status to narcotic agent status: Secondary | ICD-10-CM

## 2020-08-14 DIAGNOSIS — F32A Depression, unspecified: Secondary | ICD-10-CM | POA: Diagnosis present

## 2020-08-14 DIAGNOSIS — L03113 Cellulitis of right upper limb: Secondary | ICD-10-CM | POA: Diagnosis not present

## 2020-08-14 DIAGNOSIS — K219 Gastro-esophageal reflux disease without esophagitis: Secondary | ICD-10-CM | POA: Diagnosis present

## 2020-08-14 DIAGNOSIS — N179 Acute kidney failure, unspecified: Secondary | ICD-10-CM | POA: Diagnosis present

## 2020-08-14 DIAGNOSIS — E876 Hypokalemia: Secondary | ICD-10-CM | POA: Diagnosis not present

## 2020-08-14 DIAGNOSIS — E039 Hypothyroidism, unspecified: Secondary | ICD-10-CM | POA: Diagnosis present

## 2020-08-14 DIAGNOSIS — Z951 Presence of aortocoronary bypass graft: Secondary | ICD-10-CM

## 2020-08-14 DIAGNOSIS — Z83438 Family history of other disorder of lipoprotein metabolism and other lipidemia: Secondary | ICD-10-CM

## 2020-08-14 DIAGNOSIS — I517 Cardiomegaly: Secondary | ICD-10-CM | POA: Diagnosis not present

## 2020-08-14 DIAGNOSIS — D649 Anemia, unspecified: Secondary | ICD-10-CM | POA: Diagnosis present

## 2020-08-14 DIAGNOSIS — M79603 Pain in arm, unspecified: Secondary | ICD-10-CM | POA: Diagnosis not present

## 2020-08-14 DIAGNOSIS — R000 Tachycardia, unspecified: Secondary | ICD-10-CM | POA: Diagnosis not present

## 2020-08-14 DIAGNOSIS — R0602 Shortness of breath: Secondary | ICD-10-CM | POA: Diagnosis not present

## 2020-08-14 DIAGNOSIS — I4891 Unspecified atrial fibrillation: Secondary | ICD-10-CM | POA: Diagnosis not present

## 2020-08-14 DIAGNOSIS — E669 Obesity, unspecified: Secondary | ICD-10-CM | POA: Diagnosis present

## 2020-08-14 DIAGNOSIS — I4510 Unspecified right bundle-branch block: Secondary | ICD-10-CM | POA: Diagnosis not present

## 2020-08-14 DIAGNOSIS — L538 Other specified erythematous conditions: Secondary | ICD-10-CM | POA: Diagnosis not present

## 2020-08-14 LAB — HEPATIC FUNCTION PANEL
ALT: 19 U/L (ref 0–44)
AST: 26 U/L (ref 15–41)
Albumin: 3.8 g/dL (ref 3.5–5.0)
Alkaline Phosphatase: 63 U/L (ref 38–126)
Bilirubin, Direct: 0.1 mg/dL (ref 0.0–0.2)
Indirect Bilirubin: 0.8 mg/dL (ref 0.3–0.9)
Total Bilirubin: 0.9 mg/dL (ref 0.3–1.2)
Total Protein: 7.4 g/dL (ref 6.5–8.1)

## 2020-08-14 LAB — I-STAT VENOUS BLOOD GAS, ED
Acid-Base Excess: 0 mmol/L (ref 0.0–2.0)
Bicarbonate: 24 mmol/L (ref 20.0–28.0)
Calcium, Ion: 1.13 mmol/L — ABNORMAL LOW (ref 1.15–1.40)
HCT: 36 % (ref 36.0–46.0)
Hemoglobin: 12.2 g/dL (ref 12.0–15.0)
O2 Saturation: 82 %
Potassium: 4.1 mmol/L (ref 3.5–5.1)
Sodium: 140 mmol/L (ref 135–145)
TCO2: 25 mmol/L (ref 22–32)
pCO2, Ven: 34.3 mmHg — ABNORMAL LOW (ref 44.0–60.0)
pH, Ven: 7.454 — ABNORMAL HIGH (ref 7.250–7.430)
pO2, Ven: 44 mmHg (ref 32.0–45.0)

## 2020-08-14 LAB — BASIC METABOLIC PANEL
Anion gap: 9 (ref 5–15)
BUN: 28 mg/dL — ABNORMAL HIGH (ref 8–23)
CO2: 23 mmol/L (ref 22–32)
Calcium: 9.4 mg/dL (ref 8.9–10.3)
Chloride: 106 mmol/L (ref 98–111)
Creatinine, Ser: 1.02 mg/dL — ABNORMAL HIGH (ref 0.44–1.00)
GFR, Estimated: 57 mL/min — ABNORMAL LOW (ref >60)
Glucose, Bld: 121 mg/dL — ABNORMAL HIGH (ref 70–99)
Potassium: 4.2 mmol/L (ref 3.5–5.1)
Sodium: 138 mmol/L (ref 135–145)

## 2020-08-14 LAB — URINALYSIS, ROUTINE W REFLEX MICROSCOPIC
Bacteria, UA: NONE SEEN
Bilirubin Urine: NEGATIVE
Glucose, UA: NEGATIVE mg/dL
Ketones, ur: NEGATIVE mg/dL
Leukocytes,Ua: NEGATIVE
Nitrite: NEGATIVE
Protein, ur: NEGATIVE mg/dL
Specific Gravity, Urine: 1.014 (ref 1.005–1.030)
pH: 5 (ref 5.0–8.0)

## 2020-08-14 LAB — APTT: aPTT: 47 seconds — ABNORMAL HIGH (ref 24–36)

## 2020-08-14 LAB — CBC WITH DIFFERENTIAL/PLATELET
Abs Immature Granulocytes: 0.06 10*3/uL (ref 0.00–0.07)
Basophils Absolute: 0.1 10*3/uL (ref 0.0–0.1)
Basophils Relative: 0 %
Eosinophils Absolute: 0 10*3/uL (ref 0.0–0.5)
Eosinophils Relative: 0 %
HCT: 37.2 % (ref 36.0–46.0)
Hemoglobin: 12.4 g/dL (ref 12.0–15.0)
Immature Granulocytes: 0 %
Lymphocytes Relative: 4 %
Lymphs Abs: 0.7 10*3/uL (ref 0.7–4.0)
MCH: 32.1 pg (ref 26.0–34.0)
MCHC: 33.3 g/dL (ref 30.0–36.0)
MCV: 96.4 fL (ref 80.0–100.0)
Monocytes Absolute: 1.3 10*3/uL — ABNORMAL HIGH (ref 0.1–1.0)
Monocytes Relative: 8 %
Neutro Abs: 13.8 10*3/uL — ABNORMAL HIGH (ref 1.7–7.7)
Neutrophils Relative %: 88 %
Platelets: 236 10*3/uL (ref 150–400)
RBC: 3.86 MIL/uL — ABNORMAL LOW (ref 3.87–5.11)
RDW: 12.5 % (ref 11.5–15.5)
WBC: 16 10*3/uL — ABNORMAL HIGH (ref 4.0–10.5)
nRBC: 0 % (ref 0.0–0.2)

## 2020-08-14 LAB — RESP PANEL BY RT-PCR (FLU A&B, COVID) ARPGX2
Influenza A by PCR: NEGATIVE
Influenza B by PCR: NEGATIVE
SARS Coronavirus 2 by RT PCR: NEGATIVE

## 2020-08-14 LAB — LACTIC ACID, PLASMA
Lactic Acid, Venous: 1.2 mmol/L (ref 0.5–1.9)
Lactic Acid, Venous: 1.1 mmol/L (ref 0.5–1.9)

## 2020-08-14 LAB — PROTIME-INR
INR: 2.4 — ABNORMAL HIGH (ref 0.8–1.2)
Prothrombin Time: 25.8 seconds — ABNORMAL HIGH (ref 11.4–15.2)

## 2020-08-14 LAB — TROPONIN I (HIGH SENSITIVITY)
Troponin I (High Sensitivity): 36 ng/L — ABNORMAL HIGH (ref <18)
Troponin I (High Sensitivity): 42 ng/L — ABNORMAL HIGH (ref <18)

## 2020-08-14 MED ORDER — ADULT MULTIVITAMIN W/MINERALS CH
1.0000 | ORAL_TABLET | Freq: Every day | ORAL | Status: DC
  Administered 2020-08-15 – 2020-08-18 (×4): 1 via ORAL
  Filled 2020-08-14 (×4): qty 1

## 2020-08-14 MED ORDER — LACTATED RINGERS IV BOLUS (SEPSIS): 1000.0000 mL | Freq: Once | INTRAVENOUS | Status: DC

## 2020-08-14 MED ORDER — PIPERACILLIN-TAZOBACTAM 3.375 G IVPB
3.3750 g | Freq: Three times a day (TID) | INTRAVENOUS | Status: DC
  Administered 2020-08-15 – 2020-08-16 (×5): 3.375 g via INTRAVENOUS
  Filled 2020-08-14 (×6): qty 50

## 2020-08-14 MED ORDER — LACTATED RINGERS IV BOLUS (SEPSIS)
1000.0000 mL | Freq: Once | INTRAVENOUS | Status: AC
  Administered 2020-08-14: 1000 mL via INTRAVENOUS

## 2020-08-14 MED ORDER — MELATONIN 3 MG PO TABS
3.0000 mg | ORAL_TABLET | Freq: Every evening | ORAL | Status: DC | PRN
  Administered 2020-08-16 – 2020-08-17 (×2): 3 mg via ORAL
  Filled 2020-08-14 (×3): qty 1

## 2020-08-14 MED ORDER — VITAMIN D 25 MCG (1000 UNIT) PO TABS
4000.0000 [IU] | ORAL_TABLET | Freq: Every day | ORAL | Status: DC
  Administered 2020-08-15 – 2020-08-18 (×4): 4000 [IU] via ORAL
  Filled 2020-08-14 (×4): qty 4

## 2020-08-14 MED ORDER — PIPERACILLIN-TAZOBACTAM 3.375 G IVPB 30 MIN
3.3750 g | Freq: Once | INTRAVENOUS | Status: AC
  Administered 2020-08-14: 3.375 g via INTRAVENOUS
  Filled 2020-08-14: qty 50

## 2020-08-14 MED ORDER — FERROUS SULFATE 325 (65 FE) MG PO TABS
325.0000 mg | ORAL_TABLET | Freq: Every day | ORAL | Status: DC
  Administered 2020-08-15 – 2020-08-18 (×4): 325 mg via ORAL
  Filled 2020-08-14 (×4): qty 1

## 2020-08-14 MED ORDER — POTASSIUM CHLORIDE CRYS ER 20 MEQ PO TBCR
40.0000 meq | EXTENDED_RELEASE_TABLET | Freq: Every day | ORAL | Status: DC
  Administered 2020-08-15: 40 meq via ORAL
  Filled 2020-08-14: qty 2

## 2020-08-14 MED ORDER — CARVEDILOL 3.125 MG PO TABS
3.1250 mg | ORAL_TABLET | Freq: Two times a day (BID) | ORAL | Status: DC
  Administered 2020-08-15 – 2020-08-18 (×8): 3.125 mg via ORAL
  Filled 2020-08-14 (×8): qty 1

## 2020-08-14 MED ORDER — ACETAMINOPHEN 325 MG PO TABS
650.0000 mg | ORAL_TABLET | Freq: Once | ORAL | Status: AC
  Administered 2020-08-14: 650 mg via ORAL
  Filled 2020-08-14: qty 2

## 2020-08-14 MED ORDER — FUROSEMIDE 40 MG PO TABS
40.0000 mg | ORAL_TABLET | Freq: Two times a day (BID) | ORAL | Status: DC
  Administered 2020-08-15 – 2020-08-18 (×7): 40 mg via ORAL
  Filled 2020-08-14 (×7): qty 1

## 2020-08-14 MED ORDER — WARFARIN - PHARMACIST DOSING INPATIENT: Freq: Every day | Status: DC

## 2020-08-14 MED ORDER — ACETAMINOPHEN 325 MG PO TABS
650.0000 mg | ORAL_TABLET | Freq: Four times a day (QID) | ORAL | Status: DC | PRN
  Administered 2020-08-15 – 2020-08-16 (×2): 650 mg via ORAL
  Filled 2020-08-14 (×2): qty 2

## 2020-08-14 MED ORDER — VANCOMYCIN HCL 1500 MG/300ML IV SOLN
1500.0000 mg | Freq: Once | INTRAVENOUS | Status: AC
  Administered 2020-08-14: 1500 mg via INTRAVENOUS
  Filled 2020-08-14: qty 300

## 2020-08-14 MED ORDER — LACTATED RINGERS IV SOLN
INTRAVENOUS | Status: DC
Start: 2020-08-14 — End: 2020-08-15

## 2020-08-14 MED ORDER — LEVOTHYROXINE SODIUM 100 MCG PO TABS
100.0000 ug | ORAL_TABLET | Freq: Every day | ORAL | Status: DC
  Administered 2020-08-15 – 2020-08-18 (×4): 100 ug via ORAL
  Filled 2020-08-14 (×4): qty 1

## 2020-08-14 MED ORDER — SERTRALINE HCL 100 MG PO TABS
100.0000 mg | ORAL_TABLET | Freq: Every day | ORAL | Status: DC
  Administered 2020-08-15 – 2020-08-18 (×4): 100 mg via ORAL
  Filled 2020-08-14: qty 4
  Filled 2020-08-14 (×3): qty 1

## 2020-08-14 MED ORDER — PRAVASTATIN SODIUM 10 MG PO TABS
20.0000 mg | ORAL_TABLET | Freq: Every day | ORAL | Status: DC
  Administered 2020-08-15 – 2020-08-18 (×4): 20 mg via ORAL
  Filled 2020-08-14 (×2): qty 1
  Filled 2020-08-14: qty 2
  Filled 2020-08-14: qty 1

## 2020-08-14 MED ORDER — ONDANSETRON HCL 4 MG/2ML IJ SOLN: 4.0000 mg | Freq: Four times a day (QID) | INTRAMUSCULAR | Status: DC | PRN

## 2020-08-14 MED ORDER — ALPRAZOLAM 0.5 MG PO TABS: 0.5000 mg | ORAL_TABLET | Freq: Two times a day (BID) | ORAL | Status: DC | PRN

## 2020-08-14 MED ORDER — WARFARIN SODIUM 4 MG PO TABS
4.0000 mg | ORAL_TABLET | Freq: Once | ORAL | Status: AC
  Administered 2020-08-15: 4 mg via ORAL
  Filled 2020-08-14 (×2): qty 1

## 2020-08-14 MED ORDER — VANCOMYCIN HCL 1000 MG/200ML IV SOLN
1000.0000 mg | INTRAVENOUS | Status: DC
  Administered 2020-08-15 – 2020-08-16 (×2): 1000 mg via INTRAVENOUS
  Filled 2020-08-14 (×2): qty 200

## 2020-08-14 NOTE — H&P (Addendum)
History and Physical  [name] [id number] DOB: 05-27-1945 DOA: 08/14/2020  Referring physician: Jarrett Soho, [place] PCP: Maurice Small, MD  Outpatient Specialists: Cardiology, pulmonology Patient coming from: Home.  Chief Complaint:  Shaking chills, generalized weakness, right forearm pain with swelling and erythema, shortness of breath worse with exertion, overall not feeling well.  HPI: [name] is a 75 y.o. female with medical history significant for paroxysmal A. fib on Coumadin, chronic systolic CHF, aortic mechanical valve on Coumadin, mitral valve regurgitation, subdural hematoma in the setting of INR greater than 2.2 shortly after AVR, now cleared by neurosurgery to maintain INR between 2-2.5, OSA on CPAP, asthma, pulmonary hypertension who presented to [facility] ED from home due to sudden onset shaking chills, generalized weakness, right forearm pain with swelling and erythema, shortness of breath worse with exertion, overall not feeling well.  Yesterday she states she was in her usual state of health, went to a seashell festival with family.  This morning woke up with acute illness mentioned above.  No chest pain.  No productive cough.  No changes in her cough she has had a dry cough for 2 to 3 months.  No nausea, abdominal pain or diarrhea.  Usually has 3 loose stools per day.  Due to her worsening shaking chills, right arm pain and overall generalized weakness she decided to call EMS.  She presented to the ED for further evaluation.  In the ED febrile with T-max 103.3, with leukocytosis WBC 16,000, tachypneic with respiration rate 24, right forearm mildly edematous, erythematous, warm and tender to the touch.  Code sepsis was called in the ED.  She received IV antibiotics and IV fluid per sepsis protocol.  TRH, hospitalist team, was asked to admit.  ED Course:  Fever with T-max 103.3.  BP 106/56, pulse 70, respiratory 23, O2 saturation 97% on room air.  Lab studies remarkable  for WBC 16,000, hemoglobin 12.4, platelet 236, neutrophil count 13.8.  Creatinine 1.02, GFR 57.  12 EKG sinus rhythm rate of 74, nonspecific ST-T changes.  QTc 475.  Review of Systems: Review of systems as noted in the HPI. All other systems reviewed and are negative.   Past Medical History:  Diagnosis Date  . Anxiety   . Aortic stenosis    status post aortic valve replacement with St. Jude mechanical prosthesis  . 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   62mm by echo 06/2016 and 1mm by CT angin 2016  . Chronic anticoagulation   . 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 Depression   . DJD (degenerative joint disease) of knee   . Dyslipidemia   . GERD (gastroesophageal reflux disease)    "several years ago; none since" (11/12/2012)  . Hyperlipidemia   . Hyperlipidemia LDL goal <70 01/19/2017  . Hypertension   . Left atrial enlargement   . Mitral regurgitation 06/25/2016   Mild moderate MR by echo 06/2016  . Obesity   . Persistent atrial fibrillation (HCC)    s/p afib ablation x 2 at [facility]  . Sleep apnea    On CPAP at 12cm H2O  . 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 BURR HOLE FOR SUBDURAL HEMATOMA  2006  . CARDIAC VALVE REPLACEMENT  2006   St. Jude AVR  . CARDIOVERSION N/A 07/22/2012  Procedure: CARDIOVERSION;  Surgeon: [name], MD;  Location: [facility] ENDOSCOPY;  Service: Cardiovascular;  Laterality: N/A;  . CARDIOVERSION N/A 11/25/2012   Procedure: CARDIOVERSION;  Surgeon: [name], MD;  Location: [facility] ENDOSCOPY;  Service: Cardiovascular;  Laterality: N/A;  . CARDIOVERSION N/A 12/30/2012   Procedure: CARDIOVERSION;  Surgeon: [name], MD;  Location: [facility] ENDOSCOPY;  Service: Cardiovascular;  Laterality: N/A;  h&p in file-HW   . CARDIOVERSION N/A 03/30/2013   Procedure: CARDIOVERSION;   Surgeon: [name], MD;  Location: [facility] ENDOSCOPY;  Service: Cardiovascular;  Laterality: N/A;  . ELECTROPHYSIOLOGIC STUDY  11/2013   Afib ablation x 2 (11/2013 and 10/2015) at [facility] by Dr Clyda Hurdle with recurrence post ablation  . KNEE ARTHROSCOPY Left 1980's   "2" (11/12/2012)  . KNEE SURGERY Left 1980's   "after 2 scopes they went in and did some kind of OR" (11/12/2012)  . TEE WITHOUT CARDIOVERSION N/A 07/22/2012   Procedure: TRANSESOPHAGEAL ECHOCARDIOGRAM (TEE);  Surgeon: [name], MD;  Location: [facility] ENDOSCOPY;  Service: Cardiovascular;  Laterality: N/A;  Rm 2034  . TEE WITHOUT CARDIOVERSION N/A 10/07/2013   Procedure: TRANSESOPHAGEAL ECHOCARDIOGRAM (TEE);  Surgeon: [name], MD;  Location: [facility] ENDOSCOPY;  Service: Cardiovascular;  Laterality: N/A;  . TEE WITHOUT CARDIOVERSION N/A 07/12/2020   Procedure: TRANSESOPHAGEAL ECHOCARDIOGRAM (TEE);  Surgeon: Sanda Klein, MD;  Location: [place];  Service: Cardiovascular;  Laterality: N/A;  . TOTAL KNEE ARTHROPLASTY Left 11/05/2014   Procedure: TOTAL KNEE ARTHROPLASTY;  Surgeon: Dorna Leitz, MD;  Location: [place];  Service: Orthopedics;  Laterality: Left;  . TUBAL LIGATION  1980's    Social History:  reports that she has never smoked. She has never used smokeless tobacco. She reports that she does not drink alcohol and does not use drugs.   Allergies  Allergen Reactions  . Codeine Anaphylaxis    Jerking, involuntary jerking   . Metoprolol     depression   . Propoxyphene Other (See Comments)    Bad vivid dreams  . Dilantin [Phenytoin Sodium Extended] Rash and Itching    Family History  Problem Relation Age of Onset  . Lung cancer Mother   . Hypertension Mother   . Hyperlipidemia Father   . Hypertension Father   . Heart disease Brother       Prior to Admission medications   Medication Sig Start Date End Date Taking? Authorizing Provider  acetaminophen (TYLENOL) 500 MG tablet Take 1,000 mg by mouth daily as needed for mild  pain.    [provider]  ALPRAZolam Duanne Moron) 0.5 MG tablet Take 0.5 mg by mouth 2 (two) times daily as needed for anxiety.  01/11/17   [provider]  amoxicillin (AMOXIL) 500 MG capsule Prior to dentist. 07/05/20   [provider]  calcium-vitamin D (OSCAL WITH D) 500-200 MG-UNIT per tablet Take 2 tablets by mouth daily.     [provider]  carvedilol (COREG) 3.125 MG tablet TAKE 1 TABLET(3.125 MG) BY MOUTH TWICE DAILY WITH A MEAL 04/11/20   Buford Dresser, MD  Cholecalciferol (VITAMIN D) 2000 UNITS tablet Take 4,000 Units by mouth daily.    [provider]  ferrous sulfate 325 (65 FE) MG tablet Take 325 mg by mouth daily with breakfast.    [provider]  furosemide (LASIX) 40 MG tablet Take 1 tablet (40 mg total) by mouth 2 (two) times daily. For weights above 212 pounds, take 80mg  in the morning and 40mg  in the evening. 07/20/20   Croitoru, [place],  MD  levothyroxine (SYNTHROID, LEVOTHROID) 100 MCG tablet Take 100 mcg daily before breakfast by mouth.    [provider]  Multiple Vitamin (MULTIVITAMIN WITH MINERALS) TABS Take 1 tablet by mouth daily.    [provider]  oxymetazoline (AFRIN) 0.05 % nasal spray Place 1 spray into both nostrils daily as needed for congestion.    [provider]  potassium chloride SA (KLOR-CON) 20 MEQ tablet TAKE 2 TABLETS(40 MEQ) BY MOUTH DAILY 07/20/20   Croitoru, Mihai, MD  pravastatin (PRAVACHOL) 20 MG tablet TAKE 1 TABLET BY MOUTH EVERY DAY 04/11/20   Buford Dresser, MD  sacubitril-valsartan (ENTRESTO) 97-103 MG Take 1 tablet by mouth 2 (two) times daily. 07/20/20   Croitoru, Mihai, MD  sertraline (ZOLOFT) 100 MG tablet Take 100 mg by mouth daily. 07/09/19   [provider]  traMADol (ULTRAM) 50 MG tablet Take 50 mg by mouth every 12 (twelve) hours as needed for severe pain. 12/22/19   [provider]  tretinoin (RETIN-A) 0.05 % cream Apply 1 application  topically at bedtime.     [provider]  warfarin (COUMADIN) 4 MG tablet TAKE 1 to 2 TABLETs  Daily as directed by the coumadin clinic 08/08/20   Croitoru, Mihai, MD  zolpidem (AMBIEN) 10 MG tablet Take 5 mg by mouth at bedtime as needed for sleep.    [provider]    Physical Exam: BP (!) 105/56   Pulse 69   Temp 98.5 F (36.9 C) (Oral)   Resp 19   Ht 5\' 7"  (1.702 m)   Wt 95.3 kg   SpO2 93%   BMI 32.89 kg/m   . General: 75 y.o. year-old female weak appearing in no acute distress.  Alert and oriented x3. . Cardiovascular: Regular rate and rhythm with no rubs or gallops.  No thyromegaly or JVD noted.  No lower extremity edema. 2/4 pulses in all 4 extremities. Marland Kitchen Respiratory: Clear to auscultation with no wheezes or rales. Good inspiratory effort. . Abdomen: Soft nontender nondistended with normal bowel sounds x4 quadrants. . Muskuloskeletal: No cyanosis, clubbing or edema noted bilaterally . Neuro: CN II-XII intact, strength, sensation, reflexes . Skin: Right forearm mildly edematous, erythematous, warm, tender with palpation. Marland Kitchen Psychiatry: Judgement and insight appear normal. Mood is appropriate for condition and setting          Labs on Admission:  Basic Metabolic Panel: Recent Labs  Lab 08/14/20 1730 08/14/20 1736  NA 138 140  K 4.2 4.1  CL 106  --   CO2 23  --   GLUCOSE 121*  --   BUN 28*  --   CREATININE 1.02*  --   CALCIUM 9.4  --    Liver Function Tests: Recent Labs  Lab 08/14/20 1730  AST 26  ALT 19  ALKPHOS 63  BILITOT 0.9  PROT 7.4  ALBUMIN 3.8   No results for input(s): LIPASE, AMYLASE in the last 168 hours. No results for input(s): AMMONIA in the last 168 hours. CBC: Recent Labs  Lab 08/14/20 1730 08/14/20 1736  WBC 16.0*  --   NEUTROABS 13.8*  --   HGB 12.4 12.2  HCT 37.2 36.0  MCV 96.4  --   PLT 236  --    Cardiac Enzymes: No results for input(s): CKTOTAL, CKMB, CKMBINDEX, TROPONINI in the last 168 hours.  BNP  (last 3 results) Recent Labs    09/24/19 1742  BNP 696.5*    ProBNP (last 3 results) No results for input(s):  PROBNP in the last 8760 hours.  CBG: No results for input(s): GLUCAP in the last 168 hours.  Radiological Exams on Admission: DG Chest 2 View  Result Date: 08/14/2020 CLINICAL DATA:  Shortness of breath. EXAM: CHEST - 2 VIEW COMPARISON:  Chest radiograph dated 09/24/2019. FINDINGS: The heart is enlarged. Vascular calcifications are seen in the aortic arch. Median sternotomy wires and an aortic valve prosthesis are noted. Mild bilateral lower lung atelectasis/airspace disease. There is no pleural effusion or pneumothorax. Degenerative changes are seen in the spine. IMPRESSION: Mild bilateral lower lung atelectasis/airspace disease. Cardiomegaly. Aortic Atherosclerosis (ICD10-I70.0). Electronically Signed   By: Zerita Boers M.D.   On: 08/14/2020 17:25    EKG: I independently viewed the EKG done and my findings are as followed: Sinus rhythm rate of 74.  Nonspecific ST-T changes.  QTc 475.  Assessment/Plan Present on Admission: . Sepsis ([place])  Active Problems:   Sepsis ([place])  Sepsis secondary to right forearm cellulitis. Presented with sudden onset of shaking chills, right forearm edema, erythema and tenderness with palpation, generalized weakness, and dyspnea with exertion Febrile with T-max 103.3, WBC 16,000, respiratory rate 24  Code sepsis called in the ED, started on IV antibiotics Zosyn and IV vancomycin Received 3 L bolus of lactated ringer. Continue gentle IV fluid hydration, decrease the rate due to history of CHF Maintain MAP greater than 65 Monitor fever curve and WBC. Follow blood cultures x2 and urine culture. Obtain MRSA screening  Elevated troponin, suspect demand ischemia in the setting of sepsis Troponin uptrending 42 from 36 No evidence of acute ischemia on twelve-lead EKG. She has denied any chest pain.   Last TEE was done on 07/12/2020.  Her left  ventricular ejection fraction was 50 to 55%, left ventricle global hypokinesis, left atrial size was severely dilated, right atrial size was severely dilated.  Moderate to severe mitral valve regurgitation.  The aortic valve has been repaired/replaced.  There is a 19 mm mechanical valve present in the aortic position. Repeat troponin Closely monitor on telemetry Consult cardiology due to extensive cardiac history.  Dyspnea could be due to severe mitral regurgitation versus acute on chronic diastolic CHF, although she is euvolemic on exam Obtain BNP Resume asthma home regimen. She is currently not hypoxic. Incentive spirometer Bronchodilators Mobilize as tolerated.  AKI Baseline creatinine has been 0.7 with GFR greater than 60 Presented with creatinine of 1.02 with GFR 57 Monitor urine output Avoid nephrotoxic agents and hypotension Repeat renal panel in the morning.  Generalized weakness likely secondary to sepsis Treat underlying conditions Obtain TSH PT OT to assess Fall precautions  Aortic mechanical valve/small PFO With predominantly left-to-right shunting across the atrial septum. Continue home Coumadin for CVA prevention History of subdural hematoma in the setting of INR greater than 2.2 shortly after AVR, now cleared by neurosurgery to maintain INR between 2-2.5  Chronic anxiety Stable Resume home regimen.  Chronic diastolic CHF Euvolemic on exam Last TEE 07/12/2020 Resume home regimen. Strict I's and O's and daily weight Holding off home Entresto due to soft blood pressures. Cardiology consult due to extensive cardiac history in the setting of sepsis  Hypothyroidism Obtain TSH Resume home levothyroxine  Essential hypertension BPs are currently soft. Hold off home Coreg for now resume in the morning if BP allows. Continue to closely monitor vital signs  Hyperlipidemia Resume home pravastatin.  OSA CPAP qhs    DVT prophylaxis: Coumadin.  Code Status:  Full code.  Family Communication: None at bedside.  Patient states  she has already communicated her admission with her sister via phone.  Disposition Plan:  Admit to progressive/stepdown unit, higher level of care, for close monitoring.  Consults called: Cardiology.  Admission status: Inpatient status.   Status is: Inpatient    Dispo:  Patient From: Home  Planned Disposition: Home, possibly on 08/16/2020 once symptomatology has improved and cardiology signs off.  Medically stable for discharge: No, ongoing management of sepsis secondary to right forearm cellulitis.         Kayleen Memos MD [facility] Pager [phone]  If 7PM-7AM, please contact night-coverage [email or web address] Password [facility]  08/14/2020, 9:52 PM

## 2020-08-14 NOTE — Progress Notes (Signed)
ANTICOAGULATION CONSULT NOTE - Initial Consult  Pharmacy Consult for warfarin Indication: atrial fibrillation  Allergies  Allergen Reactions  . Codeine Anaphylaxis    Jerking, involuntary jerking   . Metoprolol     depression   . Propoxyphene Other (See Comments)    Bad vivid dreams  . Dilantin [Phenytoin Sodium Extended] Rash and Itching    Patient Measurements: Height: 5\' 7"  (170.2 cm) Weight: 95.3 kg (210 lb) IBW/kg (Calculated) : 61.6  Vital Signs: Temp: 98.5 F (36.9 C) (05/08 1954) Temp Source: Oral (05/08 1954) BP: 105/56 (05/08 2130) Pulse Rate: 69 (05/08 2130)  Labs: Recent Labs    08/14/20 1730 08/14/20 1736 08/14/20 1814  HGB 12.4 12.2  --   HCT 37.2 36.0  --   PLT 236  --   --   APTT 47*  --   --   LABPROT 25.8*  --   --   INR 2.4*  --   --   CREATININE 1.02*  --   --   TROPONINIHS 36*  --  42*    Estimated Creatinine Clearance: 56.5 mL/min (A) (by C-G formula based on SCr of 1.02 mg/dL (H)).   Medical History: Past Medical History:  Diagnosis Date  . Anxiety   . Aortic stenosis    status post aortic valve replacement with St. Jude mechanical prosthesis  . 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   105mm by echo 06/2016 and 5mm by CT angin 2016  . Chronic anticoagulation   . 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 Depression   . DJD (degenerative joint disease) of knee   . Dyslipidemia   . GERD (gastroesophageal reflux disease)    "several years ago; none since" (11/12/2012)  . Hyperlipidemia   . Hyperlipidemia LDL goal <70 01/19/2017  . Hypertension   . Left atrial enlargement   . Mitral regurgitation 06/25/2016   Mild moderate MR by echo 06/2016  . Obesity   . Persistent atrial fibrillation (HCC)    s/p afib ablation x 2 at [facility]  . Sleep apnea    On CPAP at 12cm H2O  . 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 - now cleared by neurosurgery to maintain INR  2-2.5     Assessment: 45 yoF with hx mAVR and AFib admitted with sepsis. INR goal 2-2.5 per clinic notes. INR therapeutic at 2.4, CBC wnl.  *Home dose 8mg  MWF, 4mg  AODs  Goal of Therapy:  INR 2-2.5 Monitor platelets by anticoagulation protocol: Yes   Plan:  Warfarin 4mg  x1 tonight Daily protime   Arrie Senate, PharmD, BCPS, [facility] Clinical Pharmacist [phone] Please check AMION for all [facility] Pharmacy numbers 08/14/2020

## 2020-08-14 NOTE — Progress Notes (Signed)
Pharmacy Antibiotic Note  [name] is a 75 y.o. female admitted on 08/14/2020 with sepsis.  Pharmacy has been consulted for vancomycin and zosyn dosing.  Plan: Vancomycin 1500 mg IV x 1, then 1000 mg IV every 24 hours (eAUC 518, Goal AUC 400-550, SCr 1.02) Zosyn 3.375g IV every 8 hours (extended infusion) Monitor renal function, Cx and clinical progression to narrow Vancomycin levels as needed     Temp (24hrs), Avg:101.8 F (38.8 C), Min:100.3 F (37.9 C), Max:103.3 F (39.6 C)  Recent Labs  Lab 08/14/20 1730  WBC 16.0*  CREATININE 1.02*  LATICACIDVEN 1.2    CrCl cannot be calculated (Unknown ideal weight.).    Allergies  Allergen Reactions  . Codeine Anaphylaxis    Jerking, involuntary jerking   . Metoprolol     depression   . Propoxyphene Other (See Comments)    Bad vivid dreams  . Dilantin [Phenytoin Sodium Extended] Rash and Itching    Bertis Ruddy, PharmD Clinical Pharmacist ED Pharmacist Phone # [phone] 08/14/2020 7:15 PM

## 2020-08-14 NOTE — ED Triage Notes (Signed)
Patient arrived by [facility] for SOB that started acutely this am and has had right arm pain x 2-3 weeks. Has known heart valve problem and reports pending surgical repair. Denies CP

## 2020-08-14 NOTE — ED Notes (Signed)
PA aware of pt's BP.  

## 2020-08-14 NOTE — ED Provider Notes (Addendum)
[facility] EMERGENCY DEPARTMENT Provider Note   CSN: [id number] Arrival date & time: 08/14/20  3716     History Chief Complaint  Patient presents with  . Shortness of Breath    [name] is a 75 y.o. female with past medical history significant for aortic stenosis s/p aortic valve replacement with CABG mechanical palpitations, ascending aortic dilation, chronic anticoagulation, persistent A. fib, sleep apnea, hypertension, mitral regurg. Cardiologist Dr. Sallyanne Kuster. Patient has appointment with Dr. Burt Knack tomorrow morning for leaky mitral valve.  Echo on 07/12/20 with LVEF of 50 to 55%.  HPI Patient presents to emergency department today via EMS with chief complaint of shortness of breath x1 day.  Patient states she woke up this morning and overall did not feel well.  When she got in the car at 9:45 AM to drive home from [place] she had shortness of breath, felt clammy and had pain in her right arm.  She states the drive took her 4 hours and she felt anxious the entire time.  When she got home she laid in bed and took a Xanax for her anxiety.  While laying here her symptoms continued which prompted her to call EMS.  No medications were given by EMS prior to arrival.  Patient also admits to taking her daily medications today which includes Coumadin.  Patient states she was in [place] visiting her son.  She admits to walking a lot yesterday at the [employer].  She has some shortness of breath then however it felt like her normal.  Patient admits she wears oxygen at night and uses a CPAP.  She did not have bleeding with her last night.  She denies any fever, chills, cough, congestion, chest pain, back pain, abdominal pain, nausea, emesis, urinary symptoms, diarrhea.  Past Medical History:  Diagnosis Date  . Anxiety   . Aortic stenosis    status post aortic valve replacement with St. Jude mechanical prosthesis  . Ascending aorta dilatation (HCC)  06/25/2016   80mm by echo 06/2016 and 42mm by CT angin 2016  . Chronic anticoagulation   . 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 Depression   . DJD (degenerative joint disease) of knee   . Dyslipidemia   . GERD (gastroesophageal reflux disease)    "several years ago; none since" (11/12/2012)  . Hyperlipidemia   . Hyperlipidemia LDL goal <70 01/19/2017  . Hypertension   . Left atrial enlargement   . Mitral regurgitation 06/25/2016   Mild moderate MR by echo 06/2016  . Obesity   . Persistent atrial fibrillation (HCC)    s/p afib ablation x 2 at [facility]  . Sleep apnea    On CPAP at 12cm H2O  . 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 CHF exacerbation ([place]) 09/25/2019  . Acute respiratory failure with hypoxia ([place]) 09/24/2019  . History of subdural hematoma 09/24/2019  . Hypothyroidism 09/24/2019  . Acute on chronic combined systolic and diastolic CHF (congestive heart failure) ([place]) 09/24/2019  . Permanent atrial fibrillation ([place]) 06/18/2019  . Severe tricuspid regurgitation 12/18/2018  . Cholelithiasis without cholecystitis 07/31/2017  . Family history of colon cancer 07/31/2017  . Slow transit constipation 07/31/2017  . Hematoma 07/11/2017  . Rectus sheath hematoma, initial encounter   .  Hyperlipidemia LDL goal <70 01/19/2017  . Morbid obesity due to excess calories ([place]) complicated by low ERV on pfts/ hbp/hyperlipidemia 09/02/2016  . Mitral regurgitation 06/25/2016  . Ascending aorta dilatation ([place]) 06/25/2016  . Dyspnea on exertion 04/04/2016  . Sinusitis, chronic 04/04/2016  . Cough variant asthma vs UACS 04/03/2016  . Primary osteoarthritis of left knee   . DJD (degenerative joint disease) of knee 11/05/2014  . Encounter for therapeutic drug monitoring 05/07/2013  . Obstructive sleep apnea  01/20/2013  . H/O mechanical aortic valve replacement 01/15/2013  . Aortic valve disorder 01/15/2013  . Chronic diastolic CHF (congestive heart failure) ([place]) 11/14/2012  . Paroxysmal atrial fibrillation ([place]) 07/17/2012  . Essential hypertension 07/17/2012    Past Surgical History:  Procedure Laterality Date  . BURR HOLE FOR SUBDURAL HEMATOMA  2006  . CARDIAC VALVE REPLACEMENT  2006   St. Jude AVR  . CARDIOVERSION N/A 07/22/2012   Procedure: CARDIOVERSION;  Surgeon: [name], MD;  Location: [facility] ENDOSCOPY;  Service: Cardiovascular;  Laterality: N/A;  . CARDIOVERSION N/A 11/25/2012   Procedure: CARDIOVERSION;  Surgeon: [name], MD;  Location: [facility] ENDOSCOPY;  Service: Cardiovascular;  Laterality: N/A;  . CARDIOVERSION N/A 12/30/2012   Procedure: CARDIOVERSION;  Surgeon: [name], MD;  Location: [facility] ENDOSCOPY;  Service: Cardiovascular;  Laterality: N/A;  h&p in file-HW   . CARDIOVERSION N/A 03/30/2013   Procedure: CARDIOVERSION;  Surgeon: [name], MD;  Location: [facility] ENDOSCOPY;  Service: Cardiovascular;  Laterality: N/A;  . ELECTROPHYSIOLOGIC STUDY  11/2013   Afib ablation x 2 (11/2013 and 10/2015) at [facility] by Dr Clyda Hurdle with recurrence post ablation  . KNEE ARTHROSCOPY Left 1980's   "2" (11/12/2012)  . KNEE SURGERY Left 1980's   "after 2 scopes they went in and did some kind of OR" (11/12/2012)  . TEE WITHOUT CARDIOVERSION N/A 07/22/2012   Procedure: TRANSESOPHAGEAL ECHOCARDIOGRAM (TEE);  Surgeon: [name], MD;  Location: [facility] ENDOSCOPY;  Service: Cardiovascular;  Laterality: N/A;  Rm 2034  . TEE WITHOUT CARDIOVERSION N/A 10/07/2013   Procedure: TRANSESOPHAGEAL ECHOCARDIOGRAM (TEE);  Surgeon: [name], MD;  Location: [facility] ENDOSCOPY;  Service: Cardiovascular;  Laterality: N/A;  . TEE WITHOUT CARDIOVERSION N/A 07/12/2020   Procedure: TRANSESOPHAGEAL ECHOCARDIOGRAM (TEE);  Surgeon: Sanda Klein, MD;  Location: [place];  Service: Cardiovascular;  Laterality: N/A;  . TOTAL KNEE  ARTHROPLASTY Left 11/05/2014   Procedure: TOTAL KNEE ARTHROPLASTY;  Surgeon: Dorna Leitz, MD;  Location: [place];  Service: Orthopedics;  Laterality: Left;  . TUBAL LIGATION  1980's     OB History   No obstetric history on file.     Family History  Problem Relation Age of Onset  . Lung cancer Mother   . Hypertension Mother   . Hyperlipidemia Father   . Hypertension Father   . Heart disease Brother     Social History   Tobacco Use  . Smoking status: Never Smoker  . Smokeless tobacco: Never Used  Substance Use Topics  . Alcohol use: No    Comment: 11/12/2012 "no alcohol in the last 9 years"  . Drug use: No    Home Medications Prior to Admission medications   Medication Sig Start Date End Date Taking? Authorizing Provider  acetaminophen (TYLENOL) 500 MG tablet Take 1,000 mg by mouth daily as needed for mild pain.    [provider]  ALPRAZolam Duanne Moron) 0.5 MG tablet Take 0.5 mg by mouth 2 (two) times daily as needed for anxiety.  01/11/17   [provider]  amoxicillin (AMOXIL) 500 MG capsule Prior to dentist. 07/05/20   [provider]  calcium-vitamin D (OSCAL WITH D) 500-200 MG-UNIT per tablet Take 2 tablets by mouth daily.     [provider]  carvedilol (COREG) 3.125 MG tablet TAKE 1 TABLET(3.125 MG) BY MOUTH TWICE DAILY WITH A MEAL 04/11/20   Buford Dresser, MD  Cholecalciferol (VITAMIN D) 2000 UNITS tablet Take 4,000 Units by mouth daily.    [provider]  ferrous sulfate 325 (65 FE) MG tablet Take 325 mg by mouth daily with breakfast.    [provider]  furosemide (LASIX) 40 MG tablet Take 1 tablet (40 mg total) by mouth 2 (two) times daily. For weights above 212 pounds, take 80mg  in the morning and 40mg  in the evening. 07/20/20   Croitoru, Mihai, MD  levothyroxine (SYNTHROID, LEVOTHROID) 100 MCG tablet Take 100 mcg daily before breakfast by mouth.    [provider]  Multiple Vitamin (MULTIVITAMIN WITH  MINERALS) TABS Take 1 tablet by mouth daily.    [provider]  oxymetazoline (AFRIN) 0.05 % nasal spray Place 1 spray into both nostrils daily as needed for congestion.    [provider]  potassium chloride SA (KLOR-CON) 20 MEQ tablet TAKE 2 TABLETS(40 MEQ) BY MOUTH DAILY 07/20/20   Croitoru, Mihai, MD  pravastatin (PRAVACHOL) 20 MG tablet TAKE 1 TABLET BY MOUTH EVERY DAY 04/11/20   Buford Dresser, MD  sacubitril-valsartan (ENTRESTO) 97-103 MG Take 1 tablet by mouth 2 (two) times daily. 07/20/20   Croitoru, Mihai, MD  sertraline (ZOLOFT) 100 MG tablet Take 100 mg by mouth daily. 07/09/19   [provider]  traMADol (ULTRAM) 50 MG tablet Take 50 mg by mouth every 12 (twelve) hours as needed for severe pain. 12/22/19   [provider]  tretinoin (RETIN-A) 0.05 % cream Apply 1 application topically at bedtime.     [provider]  warfarin (COUMADIN) 4 MG tablet TAKE 1 to 2 TABLETs  Daily as directed by the coumadin clinic 08/08/20   Croitoru, Mihai, MD  zolpidem (AMBIEN) 10 MG tablet Take 5 mg by mouth at bedtime as needed for sleep.    [provider]    Allergies    Codeine, Metoprolol, Propoxyphene, and Dilantin [phenytoin sodium extended]  Review of Systems   Review of Systems All other systems are reviewed and are negative for acute change except as noted in the HPI.  Physical Exam Updated Vital Signs BP 117/64   Pulse 76   Temp (!) 103.3 F (39.6 C) (Rectal)   Resp 20   SpO2 97%   Physical Exam Vitals and nursing note reviewed.  Constitutional:      General: She is not in acute distress.    Appearance: She is not ill-appearing.  HENT:     Head: Normocephalic and atraumatic.     Right Ear: External ear normal.     Left Ear: External ear normal.     Nose: Nose normal.     Mouth/Throat:     Mouth: Mucous membranes are moist.     Pharynx: Oropharynx is clear.  Eyes:     General: No scleral icterus.       Right eye: No  discharge.        Left eye: No discharge.     Extraocular Movements: Extraocular movements intact.     Conjunctiva/sclera: Conjunctivae normal.     Pupils: Pupils are equal, round, and reactive to light.  Neck:  Vascular: No JVD.  Cardiovascular:     Rate and Rhythm: Normal rate. Rhythm irregular.     Pulses: Normal pulses.          Radial pulses are 2+ on the right side and 2+ on the left side.     Heart sounds: Normal heart sounds.  Pulmonary:     Comments: Lungs clear to auscultation in all fields. Symmetric chest rise. No wheezing, rales, or rhonchi. Abdominal:     Comments: Abdomen is soft, non-distended, and non-tender in all quadrants. No rigidity, no guarding. No peritoneal signs.  Musculoskeletal:        General: Normal range of motion.     Cervical back: Normal range of motion.     Right lower leg: No edema.     Left lower leg: No edema.     Comments: Compartments in right upper extremity are soft.  Skin:    General: Skin is warm and dry.     Capillary Refill: Capillary refill takes less than 2 seconds.       Neurological:     Mental Status: She is oriented to person, place, and time.     GCS: GCS eye subscore is 4. GCS verbal subscore is 5. GCS motor subscore is 6.     Comments: Fluent speech, no facial droop.  Psychiatric:        Behavior: Behavior normal.     ED Results / Procedures / Treatments   Labs (all labs ordered are listed, but only abnormal results are displayed) Labs Reviewed  BASIC METABOLIC PANEL - Abnormal; Notable for the following components:      Result Value   Glucose, Bld 121 (*)    BUN 28 (*)    Creatinine, Ser 1.02 (*)    GFR, Estimated 57 (*)    All other components within normal limits  CBC WITH DIFFERENTIAL/PLATELET - Abnormal; Notable for the following components:   WBC 16.0 (*)    RBC 3.86 (*)    Neutro Abs 13.8 (*)    Monocytes Absolute 1.3 (*)    All other components within normal limits  PROTIME-INR - Abnormal; Notable  for the following components:   Prothrombin Time 25.8 (*)    INR 2.4 (*)    All other components within normal limits  APTT - Abnormal; Notable for the following components:   aPTT 47 (*)    All other components within normal limits  URINALYSIS, ROUTINE W REFLEX MICROSCOPIC - Abnormal; Notable for the following components:   Hgb urine dipstick SMALL (*)    All other components within normal limits  I-STAT VENOUS BLOOD GAS, ED - Abnormal; Notable for the following components:   pH, Ven 7.454 (*)    pCO2, Ven 34.3 (*)    Calcium, Ion 1.13 (*)    All other components within normal limits  TROPONIN I (HIGH SENSITIVITY) - Abnormal; Notable for the following components:   Troponin I (High Sensitivity) 36 (*)    All other components within normal limits  RESP PANEL BY RT-PCR (FLU A&B, COVID) ARPGX2  CULTURE, BLOOD (ROUTINE X 2)  CULTURE, BLOOD (ROUTINE X 2)  URINE CULTURE  LACTIC ACID, PLASMA  LACTIC ACID, PLASMA  HEPATIC FUNCTION PANEL  TROPONIN I (HIGH SENSITIVITY)    EKG EKG Interpretation  Date/Time:  Sunday Aug 14 2020 16:14:31 EDT Ventricular Rate:  74 PR Interval:    QRS Duration: 166 QT Interval:  428 QTC Calculation: 475 R Axis:   -13 Text Interpretation:  Wide QRS rhythm Right bundle branch block T wave abnormality, consider inferolateral ischemia Abnormal ECG Since last tracing now with RBBB Otherwise no significant change Confirmed by Daleen Bo [phone]) on 08/14/2020 6:14:58 PM   Radiology DG Chest 2 View  Result Date: 08/14/2020 CLINICAL DATA:  Shortness of breath. EXAM: CHEST - 2 VIEW COMPARISON:  Chest radiograph dated 09/24/2019. FINDINGS: The heart is enlarged. Vascular calcifications are seen in the aortic arch. Median sternotomy wires and an aortic valve prosthesis are noted. Mild bilateral lower lung atelectasis/airspace disease. There is no pleural effusion or pneumothorax. Degenerative changes are seen in the spine. IMPRESSION: Mild bilateral lower lung  atelectasis/airspace disease. Cardiomegaly. Aortic Atherosclerosis (ICD10-I70.0). Electronically Signed   By: Zerita Boers M.D.   On: 08/14/2020 17:25    Procedures .Critical Care E&M Performed by: Barrie Folk, PA-C  Critical care provider statement:    Critical care time (minutes):  32   Critical care time was exclusive of:  Separately billable procedures and treating other patients and teaching time   Critical care was necessary to treat or prevent imminent or life-threatening deterioration of the following conditions:  Sepsis   Critical care was time spent personally by me on the following activities:  Development of treatment plan with patient or surrogate, evaluation of patient's response to treatment, examination of patient, obtaining history from patient or surrogate, ordering and performing treatments and interventions, ordering and review of laboratory studies, ordering and review of radiographic studies, pulse oximetry, re-evaluation of patient's condition and review of old charts   Care discussed with: admitting provider   After initial E/M assessment, critical care services were subsequently performed that were exclusive of separately billable procedures or treatment.       Medications Ordered in ED Medications  vancomycin (VANCOREADY) IVPB 1000 mg/200 mL (has no administration in time range)  piperacillin-tazobactam (ZOSYN) IVPB 3.375 g (has no administration in time range)  lactated ringers infusion ( Intravenous New Bag/Given 08/14/20 2027)  acetaminophen (TYLENOL) tablet 650 mg (650 mg Oral Given 08/14/20 1829)  lactated ringers bolus 1,000 mL (0 mLs Intravenous Stopped 08/14/20 2013)  piperacillin-tazobactam (ZOSYN) IVPB 3.375 g (0 g Intravenous Stopped 08/14/20 1903)  vancomycin (VANCOREADY) IVPB 1500 mg/300 mL (1,500 mg Intravenous New Bag/Given 08/14/20 1917)  lactated ringers bolus 1,000 mL (1,000 mLs Intravenous New Bag/Given 08/14/20 2021)    And  lactated ringers  bolus 1,000 mL (1,000 mLs Intravenous New Bag/Given 08/14/20 2023)    ED Course  I have reviewed the triage vital signs and the nursing notes.  Pertinent labs & imaging results that were available during my care of the patient were reviewed by me and considered in my medical decision making (see chart for details).  Vitals:   08/14/20 2030 08/14/20 2045 08/14/20 2100 08/14/20 2130  BP: (!) 97/48  (!) 101/55 (!) 105/56  Pulse: 61 69 65 69  Resp: (!) 21 20 20 19   Temp:      TempSrc:      SpO2: 97% 95% 96% 93%  Weight:      Height:           MDM Rules/Calculators/A&P                          History provided by patient with additional history obtained from chart review.     Code sepsis called after my initial evaluating the patient.  She is presenting with shortness of breath however also has erythema on  her right forearm, possible cellulitis. No abdominal pain or tenderness on exam. Antibiotics ordered for unknown source as early DDx includes pneumonia vs cellulitis vs unknown etiology.  She had oral temp 100.3 in triage when this was rechecked rectally she is febrile to 103.3. Tylenol given. Patient is not tachycardic however is on a beta-blocker.  Patient started on 1 L LR while waiting on labs to result as she is not hypotensive on arrival. Blood cultures collected. CBC with leukocytosis 16.0. INR 2.4. PTT 47.  Lactic acid normal at 1.2.  First Troponin 36, priors have been in the 30s.  EKG without ischemic changes.  Looks similar compared to prior.  BNP without significant electrolyte derangement. BUN/Cr 28/1.29m LFTS normal. Covid and flu negative. Chest xray with mild bilateral lower lung atelectasis/airspace disease and cardiomegaly.  Reassessed patient and she is hypotensive to 96/43.  As she has already received 1 L will add on 2 L LR to reach the 30cc/kg amount. Pressuring improving with fluids. UA without infection. This case was discussed with ED supervising physician Dr. Eulis Foster  who has seen the patient and agrees with plan to admit. Assigned admission. Spoke with Dr. Nevada Crane with hospitalist service who agrees to assume care of patient and bring into the hospital for further evaluation and management.     Portions of this note were generated with Lobbyist. Dictation errors may occur despite best attempts at proofreading.   Final Clinical Impression(s) / ED Diagnoses Final diagnoses:  Sepsis due to undetermined organism [facility])    Rx / DC Orders ED Discharge Orders    None       Barrie Folk, PA-C 08/14/20 2148    Barrie Folk, PA-C 08/14/20 2149    Daleen Bo, MD 08/15/20 1720

## 2020-08-14 NOTE — ED Provider Notes (Signed)
  Face-to-face evaluation   History: She presents for evaluation of chills, and right arm pain.  She has also had some shortness of breath today.  She states she intermittently gets short of breath.  She has had her COVID vaccines.  She denies cough or shortness of breath.  She is not having nausea or vomiting.  [place] the [place] and able to drive for her trip back here on her own.  Physical exam: Alert elderly female who is cooperative and uncomfortable.  Abdomen soft and nontender to palpation.  No peripheral edema of the arms or legs.  Has some mild diffuse tenderness but no localized areas of erythema, or significant skin lesions.  Medical screening examination/treatment/procedure(s) were conducted as a shared visit with non-physician practitioner(s) and myself.  I personally evaluated the patient during the encounter   Daleen Bo, MD 08/15/20 [phone]

## 2020-08-14 NOTE — ED Notes (Signed)
RN attempted report x1.  

## 2020-08-15 ENCOUNTER — Inpatient Hospital Stay (HOSPITAL_COMMUNITY): Payer: Medicare Other

## 2020-08-15 ENCOUNTER — Institutional Professional Consult (permissible substitution): Payer: Medicare Other | Admitting: Cardiovascular Disease

## 2020-08-15 ENCOUNTER — Encounter (HOSPITAL_COMMUNITY): Payer: Self-pay | Admitting: Internal Medicine

## 2020-08-15 DIAGNOSIS — I4821 Permanent atrial fibrillation: Secondary | ICD-10-CM

## 2020-08-15 DIAGNOSIS — Z952 Presence of prosthetic heart valve: Secondary | ICD-10-CM | POA: Diagnosis not present

## 2020-08-15 DIAGNOSIS — I340 Nonrheumatic mitral (valve) insufficiency: Secondary | ICD-10-CM | POA: Diagnosis not present

## 2020-08-15 DIAGNOSIS — L538 Other specified erythematous conditions: Secondary | ICD-10-CM

## 2020-08-15 DIAGNOSIS — I712 Thoracic aortic aneurysm, without rupture: Secondary | ICD-10-CM

## 2020-08-15 DIAGNOSIS — R609 Edema, unspecified: Secondary | ICD-10-CM

## 2020-08-15 LAB — PROTIME-INR
INR: 2.9 — ABNORMAL HIGH (ref 0.8–1.2)
Prothrombin Time: 30.4 seconds — ABNORMAL HIGH (ref 11.4–15.2)

## 2020-08-15 LAB — BASIC METABOLIC PANEL
Anion gap: 6 (ref 5–15)
BUN: 20 mg/dL (ref 8–23)
CO2: 24 mmol/L (ref 22–32)
Calcium: 8.3 mg/dL — ABNORMAL LOW (ref 8.9–10.3)
Chloride: 108 mmol/L (ref 98–111)
Creatinine, Ser: 0.9 mg/dL (ref 0.44–1.00)
GFR, Estimated: >60 mL/min (ref >60)
Glucose, Bld: 152 mg/dL — ABNORMAL HIGH (ref 70–99)
Potassium: 3.2 mmol/L — ABNORMAL LOW (ref 3.5–5.1)
Sodium: 138 mmol/L (ref 135–145)

## 2020-08-15 LAB — ECHOCARDIOGRAM LIMITED
AR max vel: 0.81 cm2
AV Area VTI: 0.86 cm2
AV Area mean vel: 0.8 cm2
AV Mean grad: 19.8 mmHg
AV Peak grad: 33 mmHg
Ao pk vel: 2.87 m/s
Area-P 1/2: 3.03 cm2
Height: 67 in
MV M vel: 5.57 m/s
MV Peak grad: 124.1 mmHg
Radius: 0.6 cm
S' Lateral: 3.4 cm
Weight: 3488.56 oz

## 2020-08-15 LAB — TROPONIN I (HIGH SENSITIVITY)
Troponin I (High Sensitivity): 34 ng/L — ABNORMAL HIGH (ref <18)
Troponin I (High Sensitivity): 36 ng/L — ABNORMAL HIGH (ref <18)

## 2020-08-15 LAB — CBC
HCT: 30.8 % — ABNORMAL LOW (ref 36.0–46.0)
Hemoglobin: 10.2 g/dL — ABNORMAL LOW (ref 12.0–15.0)
MCH: 32.4 pg (ref 26.0–34.0)
MCHC: 33.1 g/dL (ref 30.0–36.0)
MCV: 97.8 fL (ref 80.0–100.0)
Platelets: 190 10*3/uL (ref 150–400)
RBC: 3.15 MIL/uL — ABNORMAL LOW (ref 3.87–5.11)
RDW: 13.2 % (ref 11.5–15.5)
WBC: 10.2 10*3/uL (ref 4.0–10.5)
nRBC: 0 % (ref 0.0–0.2)

## 2020-08-15 LAB — TSH: TSH: 4.836 u[IU]/mL — ABNORMAL HIGH (ref 0.350–4.500)

## 2020-08-15 LAB — PROCALCITONIN: Procalcitonin: 0.3 ng/mL

## 2020-08-15 LAB — PHOSPHORUS: Phosphorus: 2.6 mg/dL (ref 2.5–4.6)

## 2020-08-15 LAB — MAGNESIUM: Magnesium: 2.1 mg/dL (ref 1.7–2.4)

## 2020-08-15 LAB — MRSA PCR SCREENING: MRSA by PCR: NEGATIVE

## 2020-08-15 LAB — GLUCOSE, CAPILLARY: Glucose-Capillary: 98 mg/dL (ref 70–99)

## 2020-08-15 LAB — BRAIN NATRIURETIC PEPTIDE: B Natriuretic Peptide: 598.4 pg/mL — ABNORMAL HIGH (ref 0.0–100.0)

## 2020-08-15 LAB — T4, FREE: Free T4: 1.13 ng/dL — ABNORMAL HIGH (ref 0.61–1.12)

## 2020-08-15 MED ORDER — WARFARIN SODIUM 2 MG PO TABS
2.0000 mg | ORAL_TABLET | Freq: Once | ORAL | Status: AC
  Administered 2020-08-15: 2 mg via ORAL
  Filled 2020-08-15: qty 1

## 2020-08-15 MED ORDER — POTASSIUM CHLORIDE CRYS ER 20 MEQ PO TBCR
40.0000 meq | EXTENDED_RELEASE_TABLET | Freq: Two times a day (BID) | ORAL | Status: DC
  Administered 2020-08-15 – 2020-08-18 (×6): 40 meq via ORAL
  Filled 2020-08-15 (×6): qty 2

## 2020-08-15 NOTE — Progress Notes (Signed)
[place] for warfarin Indication: atrial fibrillation  Allergies  Allergen Reactions  . Codeine Anaphylaxis    Jerking, involuntary jerking   . Metoprolol     depression   . Propoxyphene Other (See Comments)    Bad vivid dreams  . Dilantin [Phenytoin Sodium Extended] Rash and Itching    Patient Measurements: Height: 5\' 7"  (170.2 cm) Weight: 98.9 kg (218 lb 0.6 oz) IBW/kg (Calculated) : 61.6  Vital Signs: Temp: 97.9 F (36.6 C) (05/09 1117) Temp Source: Oral (05/09 1117) BP: 121/53 (05/09 1117) Pulse Rate: 60 (05/09 1117)  Labs: Recent Labs    08/14/20 1730 08/14/20 1736 08/14/20 1814 08/14/20 2349 08/15/20 0032 08/15/20 0553 08/15/20 0830  HGB 12.4 12.2  --   --   --  10.2*  --   HCT 37.2 36.0  --   --   --  30.8*  --   PLT 236  --   --   --   --  190  --   APTT 47*  --   --   --   --   --   --   LABPROT 25.8*  --   --   --   --   --  30.4*  INR 2.4*  --   --   --   --   --  2.9*  CREATININE 1.02*  --   --   --   --   --  0.90  TROPONINIHS 36*  --  42* 36* 34*  --   --     Estimated Creatinine Clearance: 65.2 mL/min (by C-G formula based on SCr of 0.9 mg/dL).   Medical History: Past Medical History:  Diagnosis Date  . Anxiety   . Aortic stenosis    status post aortic valve replacement with St. Jude mechanical prosthesis  . 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   35mm by echo 06/2016 and 60mm by CT angin 2016  . Chronic anticoagulation   . 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 Depression   . DJD (degenerative joint disease) of knee   . Dyslipidemia   . GERD (gastroesophageal reflux disease)    "several years ago; none since" (11/12/2012)  . Hyperlipidemia   . Hyperlipidemia LDL goal <70 01/19/2017  . Hypertension   . Left atrial enlargement   . Mitral regurgitation 06/25/2016   Mild moderate MR by echo 06/2016  . Obesity   . Persistent atrial fibrillation (HCC)    s/p  afib ablation x 2 at [facility]  . Sleep apnea    On CPAP at 12cm H2O  . 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 - now cleared by neurosurgery to maintain INR 2-2.5     Assessment: 60 yoF with hx mAVR and AFib admitted with sepsis. INR goal 2-2.5 per clinic notes (due to history of subdural hematoma) -INR= 2.9  *Home dose 8mg  MWF, 4mg  AODs  Goal of Therapy:  INR 2-2.5 Monitor platelets by anticoagulation protocol: Yes   Plan:  -Reduce today's coumadin dose to 2mg   -Daily PT/INR  Hildred Laser, PharmD Clinical Pharmacist **Pharmacist phone directory can now be found on [email or web address] ([email or web address]).  Listed under [place].

## 2020-08-15 NOTE — Evaluation (Signed)
Physical Therapy Evaluation Patient Details Name: [name] MRN: [id number] DOB: 1945/08/27 Today's Date: 08/15/2020   History of Present Illness  Patient is a 75 yo female admitted 5/8 with RUE pain, edema, weakness and SOB. Pt with sepsis due to RUE cellulitis. PMHx: AFib, CHF, AVR, MVR, SDH, Asthma, pulmonary HTN, HLD, Lt TKA  Clinical Impression  Patient presents with dyspnea on exertion, mild weakness, decreased cardiovascular endurance and impaired mobility s/p above. Pt lives at home with her spouse and reports being mod I for ADLs/IADLs and uses SPC as needed for ambulation. Today, pt tolerated transfers and gait training with Min guard-supervision for safety with 1 longer standing rest break due to 2/4 DOE. VSS on RA. Encouraged walking with nursing again later in the day to improve overall endurance/strength to prepare for possible d/c home tomorrow. Will follow acutely to maximize independence and mobility prior to return home.    Follow Up Recommendations No PT follow up;Supervision - Intermittent    Equipment Recommendations  None recommended by PT    Recommendations for Other Services       Precautions / Restrictions Precautions Precautions: Fall;Other (comment) Precaution Comments: Keep MAP >65 Restrictions Weight Bearing Restrictions: No      Mobility  Bed Mobility               General bed mobility comments: Sitting EOB upon PT arrival.    Transfers Overall transfer level: Needs assistance Equipment used: None Transfers: Sit to/from Stand Sit to Stand: Supervision         General transfer comment: Supervision for safety. Stood from [employer], from toilet x1.  Ambulation/Gait Ambulation/Gait assistance: Min guard Gait Distance (Feet): 150 Feet Assistive device: None Gait Pattern/deviations: Antalgic;Step-through pattern;Decreased stride length Gait velocity: decreased   General Gait Details: Slow, antalgic like gait with 1 standing rest  break due to 2/4 DOE. VSS on RA.  Stairs            Wheelchair Mobility    Modified Rankin (Stroke Patients Only)       Balance Overall balance assessment: Mild deficits observed, not formally tested                                           Pertinent Vitals/Pain Pain Assessment: Faces Faces Pain Scale: Hurts a little bit Pain Location: RUE Pain Descriptors / Indicators: Sore Pain Intervention(s): Monitored during session;Repositioned    Home Living Family/patient expects to be discharged to:: Private residence Living Arrangements: Spouse/significant other Available Help at Discharge: Family;Available PRN/intermittently Type of Home: Other(Comment) (townhome) Home Access: Level entry     Home Layout: One level Home Equipment: Walker - 2 wheels;Bedside commode;Cane - single point;Shower seat - built in      Prior Function Level of Independence: Independent with assistive device(s)         Comments: Uses SPC as needed, does own ADLs.IADLs. Drives. No falls.     Hand Dominance        Extremity/Trunk Assessment   Upper Extremity Assessment Upper Extremity Assessment: Defer to OT evaluation (Soreness with movement of RUE, limited)    Lower Extremity Assessment Lower Extremity Assessment: Generalized weakness    Cervical / Trunk Assessment Cervical / Trunk Assessment: Normal  Communication   Communication: No difficulties  Cognition Arousal/Alertness: Awake/alert Behavior During Therapy: WFL for tasks assessed/performed Overall Cognitive Status: Within Functional Limits for tasks  assessed                                        General Comments General comments (skin integrity, edema, etc.): Sister present during session. Able to stand and assist with donning gown.    Exercises     Assessment/Plan    PT Assessment Patient needs continued PT services  PT Problem List Decreased strength;Decreased  mobility;Pain;Decreased activity tolerance;Cardiopulmonary status limiting activity       PT Treatment Interventions Therapeutic exercise;Patient/family education;Therapeutic activities;Functional mobility training;Gait training;DME instruction;Balance training    PT Goals (Current goals can be found in the Care Plan section)  Acute Rehab PT Goals Patient Stated Goal: to feel better and go home PT Goal Formulation: With patient Time For Goal Achievement: 08/29/20 Potential to Achieve Goals: Good    Frequency Min 3X/week   Barriers to discharge        Co-evaluation               AM-PAC PT "6 Clicks" Mobility  Outcome Measure Help needed turning from your back to your side while in a flat bed without using bedrails?: None Help needed moving from lying on your back to sitting on the side of a flat bed without using bedrails?: None Help needed moving to and from a bed to a chair (including a wheelchair)?: A Little Help needed standing up from a chair using your arms (e.g., wheelchair or bedside chair)?: None Help needed to walk in hospital room?: A Little Help needed climbing 3-5 steps with a railing? : A Little 6 Click Score: 21    End of Session Equipment Utilized During Treatment: Gait belt Activity Tolerance: Patient tolerated treatment well Patient left: in bed;with call bell/phone within reach;with family/visitor present (sitting EOB) Nurse Communication: Mobility status PT Visit Diagnosis: Pain;Muscle weakness (generalized) (M62.81);Unsteadiness on feet (R26.81) Pain - Right/Left: Right Pain - part of body: Arm    Time: 9528-4132 PT Time Calculation (min) (ACUTE ONLY): 21 min   Charges:   PT Evaluation $PT Eval Moderate Complexity: 1 Mod          Marisa Severin, PT, DPT Acute Rehabilitation Services Pager [phone] Office [place] 08/15/2020, 2:57 PM

## 2020-08-15 NOTE — Progress Notes (Signed)
PROGRESS NOTE   PERI [name]  [id number]    DOB: [birth year]    DOA: 08/14/2020  PCP: Maurice Small, MD   I have briefly reviewed patients previous medical records in [facility].  Chief Complaint  Patient presents with  . Shortness of Breath    Brief Narrative:  75 y.o. female with medical history significant for  permanent A. 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  In the ED, febrile to 103.3 F, code sepsis initiated and admitted for sepsis secondary to right forearm cellulitis.  Improving.   Assessment & Plan:  Active Problems:   Sepsis ([place])   Sepsis secondary to right forearm cellulitis. Presented with sudden onset of shaking chills, right forearm edema, erythema and tenderness with palpation, generalized weakness, and dyspnea with exertion Febrile with T-max 103.3, WBC 16,000, respiratory rate 24  Code sepsis called in the ED, started on IV antibiotics Zosyn and IV vancomycin Received 3 L bolus of lactated ringer and placed on maintenance IVF. Need to rule out bacteremia, especially given mechanical AVR. Right upper extremity venous Doppler: No DVT or SVT. Patient has defervesced.  Leukocytosis resolved.  Lactate normal.  Procalcitonin 0.3.  COVID-19 and flu panel PCR negative.  Urine microscopy not indicative of UTI.  Urine culture pending.  Blood cultures x2: Negative to date.  MRSA PCR negative. Discontinue IVF.  Continue empirically started IV Zosyn and  vancomycin pending culture results.  Elevated troponin, suspect demand ischemia in the setting of sepsis HS Troponin mildly elevated with flat trend: 36 > 42 > 36 > 34 No evidence of acute ischemia on twelve-lead EKG. No anginal symptoms. Last TEE was done on 07/12/2020.  Her left ventricular ejection fraction was 50 to 55%, left ventricle global hypokinesis, left atrial size was severely dilated, right atrial size was severely dilated.  Moderate to severe mitral valve regurgitation.  The aortic valve has been repaired/replaced.  There is a 19 mm mechanical valve present in the aortic position. No further evaluation needed.  Dyspnea Possibly due to sepsis picture on admission.  Clinically not in decompensated CHF.  Resolved.  AKI Resolved.  Generalized weakness  likely secondary to sepsis PT OT to assess Fall precautions  Aortic mechanical valve/small PFO With predominantly left-to-right shunting across the atrial septum. Continue home Coumadin for CVA prevention History of subdural hematoma in the setting of INR greater than 2.2 shortly after AVR, now cleared by neurosurgery to maintain INR between 2-2.5.  INR today 2.9-management per pharmacy. Cardiology consultation appreciated.  Moderate to severe mitral regurgitation Cardiology consultation appreciated.  She has had TEE and structural heart team has reviewed and valve is clip above.  Defer management to cardiology.  Permanent atrial fibrillation History of ablation x2, failed.  Currently in SR versus paroxysmal A. fib with controlled ventricular rate.  Anticoagulated on Coumadin.  Chronic anxiety Stable Resume home regimen.  Chronic systolic and diastolic CHF Euvolemic on exam Last TEE 07/12/2020 Resume home regimen. Holding off home Entresto due to soft blood pressures. Cardiology consult due to extensive cardiac  history in the setting of sepsis Clinically euvolemic.  Hypothyroidism TSH: 4.836, minimally  elevated, likely related to acute illness.  Free T4 upper limit of normal.  Recommend repeating these in 4 to 6 weeks. Resume home levothyroxine  Essential hypertension Controlled/soft blood pressures.  Only on carvedilol at this time.  Hyperlipidemia Continue home pravastatin.  OSA CPAP qhs  Ascending aortic dilatation Outpatient follow-up.  Anemia Hemoglobin is dropped from 12 range to 10.2.  Suspect dilutional from aggressive IV fluid resuscitation.  Follow CBC in a.m.  Hypokalemia Replace and follow.  Body mass index is 34.15 kg/m./Obesity   DVT prophylaxis:   Anticoagulated on Coumadin   Code Status: Full Code Family Communication: None at bedside Disposition:  Status is: Inpatient  Remains inpatient appropriate because:Inpatient level of care appropriate due to severity of illness   Dispo:  Patient From: Home  Planned Disposition: Home  Medically stable for discharge: No       Consultants:   Cardiology  Procedures:   None  Antimicrobials:    Anti-infectives (From admission, onward)   Start     Dose/Rate Route Frequency Ordered Stop   08/15/20 2100  vancomycin (VANCOREADY) IVPB 1000 mg/200 mL        1,000 mg 200 mL/hr over 60 Minutes Intravenous Every 24 hours 08/14/20 1916     08/15/20 0200  piperacillin-tazobactam (ZOSYN) IVPB 3.375 g        3.375 g 12.5 mL/hr over 240 Minutes Intravenous Every 8 hours 08/14/20 1916     08/14/20 1800  piperacillin-tazobactam (ZOSYN) IVPB 3.375 g        3.375 g 100 mL/hr over 30 Minutes Intravenous  Once 08/14/20 1754 08/14/20 1903   08/14/20 1800  vancomycin (VANCOREADY) IVPB 1500 mg/300 mL        1,500 mg 150 mL/hr over 120 Minutes Intravenous  Once 08/14/20 1754 08/14/20 2149        Subjective:  Seen this morning.  Feels much better compared to admission.  Generalized weakness.  Right upper extremity pain, redness and swelling are better.  Intermittent dry cough, feels like it is due to her CPAP.  Lives  with her spouse and 2 dogs.  Denies history of pet related injuries to right upper extremity i.e. scratch or bites.  Has been gardening but denies any gardening related injuries either to right upper extremity.  Objective:   Vitals:   08/15/20 0400 08/15/20 0728 08/15/20 0812 08/15/20 1117  BP: (!) 95/45 (!) 109/51  (!) 121/53  Pulse: (!) 55 60 63 60  Resp: 18 18  18   Temp: 98.4 F (36.9 C) 98.4 F (36.9 C)  97.9 F (36.6 C)  TempSrc: Oral Oral  Oral  SpO2: 100% 100%  100%  Weight:      Height:        General exam: Pleasant elderly female, moderately built and obese sitting up comfortably in bed without distress. Respiratory system: Clear to auscultation. Respiratory effort normal. Cardiovascular system: S1 & S2 heard, RRR. No JVD, murmurs, rubs, gallops or clicks. No pedal edema.  Telemetry personally reviewed: Sinus rhythm with intermittent paroxysmal A. fib with controlled ventricular rate. Gastrointestinal system: Abdomen is nondistended, soft and nontender. No organomegaly or masses felt. Normal bowel sounds heard. Central nervous system: Alert and oriented. No focal neurological deficits. Extremities: Symmetric 5 x 5 power.  Right forearm with mild swelling, distinctly more swollen compared to the left, increased warmth, no significant tenderness and faint fading patchy erythema.  No crepitus.  Good peripheral pulses felt. Skin: No rashes, lesions or ulcers Psychiatry: Judgement and insight appear normal. Mood & affect appropriate.     Data Reviewed:   I have personally reviewed following labs and imaging studies   CBC: Recent Labs  Lab 08/14/20 1730 08/14/20 1736 08/15/20 0553  WBC 16.0*  --  10.2  NEUTROABS 13.8*  --   --   HGB 12.4 12.2 10.2*  HCT 37.2 36.0 30.8*  MCV 96.4  --  97.8  PLT 236  --  440    Basic Metabolic Panel: Recent Labs  Lab 08/14/20 1730 08/14/20 1736 08/15/20 0830  NA 138 140 138  K 4.2 4.1 3.2*  CL 106  --  108  CO2 23  --  24   GLUCOSE 121*  --  152*  BUN 28*  --  20  CREATININE 1.02*  --  0.90  CALCIUM 9.4  --  8.3*  MG  --   --  2.1  PHOS  --   --  2.6    Liver Function Tests: Recent Labs  Lab 08/14/20 1730  AST 26  ALT 19  ALKPHOS 63  BILITOT 0.9  PROT 7.4  ALBUMIN 3.8    CBG: No results for input(s): GLUCAP in the last 168 hours.  Microbiology Studies:   Recent Results (from the past 240 hour(s))  MRSA PCR Screening     Status: None   Collection Time: 08/14/20 12:31 AM   Specimen: Nasopharyngeal  Result Value Ref Range Status   MRSA by PCR NEGATIVE NEGATIVE Final    Comment:        The GeneXpert MRSA Assay (FDA approved for NASAL specimens only), is one component of a comprehensive MRSA colonization surveillance program. It is not intended to diagnose MRSA infection nor to guide or monitor treatment for MRSA infections. Performed at [facility], [place] [address], [place], [place] [address]   Blood culture (routine x 2)     Status: None (Preliminary result)   Collection Time: 08/14/20  5:50 PM   Specimen: BLOOD LEFT FOREARM  Result Value Ref Range Status   Specimen Description BLOOD LEFT FOREARM  Final   Special Requests   Final    BOTTLES DRAWN AEROBIC AND ANAEROBIC Blood Culture adequate volume   Culture   Final    NO GROWTH < 12 HOURS Performed at [facility], [place] [address], [place], [place] [address]    Report Status PENDING  Incomplete  Blood culture (routine x 2)     Status: None (Preliminary result)   Collection Time: 08/14/20  6:16 PM   Specimen: BLOOD  Result Value Ref Range Status   Specimen Description BLOOD RIGHT ANTECUBITAL  Final   Special Requests   Final    BOTTLES DRAWN AEROBIC AND ANAEROBIC Blood Culture adequate volume   Culture   Final    NO GROWTH < 12 HOURS Performed at [facility], [place] [address], [place], [place] [address]    Report Status PENDING  Incomplete  Resp Panel by RT-PCR (Flu A&B, Covid) Nasopharyngeal Swab      Status: None   Collection Time: 08/14/20  6:20 PM   Specimen: Nasopharyngeal Swab; Nasopharyngeal(NP) swabs in vial transport medium  Result Value Ref Range Status   SARS Coronavirus 2 by RT PCR NEGATIVE NEGATIVE Final    Comment: (NOTE) SARS-CoV-2 target nucleic acids are NOT DETECTED.  The SARS-CoV-2 RNA is generally detectable in upper respiratory specimens during the  acute phase of infection. The lowest concentration of SARS-CoV-2 viral copies this assay can detect is 138 copies/mL. A negative result does not preclude SARS-Cov-2 infection and should not be used as the sole basis for treatment or other patient management decisions. A negative result may occur with  improper specimen collection/handling, submission of specimen other than nasopharyngeal swab, presence of viral mutation(s) within the areas targeted by this assay, and inadequate number of viral copies(<138 copies/mL). A negative result must be combined with clinical observations, patient history, and epidemiological information. The expected result is Negative.  Fact Sheet for Patients:  EntrepreneurPulse.com.au  Fact Sheet for Healthcare Providers:  IncredibleEmployment.be  This test is no t yet approved or cleared by the [place] FDA and  has been authorized for detection and/or diagnosis of SARS-CoV-2 by FDA under an Emergency Use Authorization (EUA). This EUA will remain  in effect (meaning this test can be used) for the duration of the COVID-19 declaration under Section 564(b)(1) of the Act, 21 U.S.C.section 360bbb-3(b)(1), unless the authorization is terminated  or revoked sooner.       Influenza A by PCR NEGATIVE NEGATIVE Final   Influenza B by PCR NEGATIVE NEGATIVE Final    Comment: (NOTE) The Xpert Xpress SARS-CoV-2/FLU/RSV plus assay is intended as an aid in the diagnosis of influenza from Nasopharyngeal swab specimens and should not be used as a sole basis  for treatment. Nasal washings and aspirates are unacceptable for Xpert Xpress SARS-CoV-2/FLU/RSV testing.  Fact Sheet for Patients: EntrepreneurPulse.com.au  Fact Sheet for Healthcare Providers: IncredibleEmployment.be  This test is not yet approved or cleared by the [place] FDA and has been authorized for detection and/or diagnosis of SARS-CoV-2 by FDA under an Emergency Use Authorization (EUA). This EUA will remain in effect (meaning this test can be used) for the duration of the COVID-19 declaration under Section 564(b)(1) of the Act, 21 U.S.C. section 360bbb-3(b)(1), unless the authorization is terminated or revoked.  Performed at [facility], [place] [address], [place], [place] [address]      Radiology Studies:  DG Chest 2 View  Result Date: 08/14/2020 CLINICAL DATA:  Shortness of breath. EXAM: CHEST - 2 VIEW COMPARISON:  Chest radiograph dated 09/24/2019. FINDINGS: The heart is enlarged. Vascular calcifications are seen in the aortic arch. Median sternotomy wires and an aortic valve prosthesis are noted. Mild bilateral lower lung atelectasis/airspace disease. There is no pleural effusion or pneumothorax. Degenerative changes are seen in the spine. IMPRESSION: Mild bilateral lower lung atelectasis/airspace disease. Cardiomegaly. Aortic Atherosclerosis (ICD10-I70.0). Electronically Signed   By: Zerita Boers M.D.   On: 08/14/2020 17:25   VAS [place] UPPER EXTREMITY VENOUS DUPLEX  Result Date: 08/15/2020 UPPER VENOUS STUDY  Patient Name:  [name]  Date of Exam:   08/15/2020 Medical Rec #: [id number]            Accession #:    [id number] Date of Birth: [birth year]             Patient Gender: F Patient Age:   29Y Exam Location:  [facility] Procedure:      VAS [place] UPPER EXTREMITY VENOUS DUPLEX Referring Phys: [id number] Cathleen Yagi D Deoni Cosey --------------------------------------------------------------------------------  Indications: Edema,  and Erythema Risk Factors: Sepsis, INR 2.4. Anticoagulation: Coumadin. Comparison Study: No prior study on file Performing Technologist: Sharion Dove RVS  Examination Guidelines: A complete evaluation includes B-mode imaging, spectral Doppler, color Doppler, and power Doppler as needed of all accessible portions of each vessel. Bilateral testing  is considered an integral part of a complete examination. Limited examinations for reoccurring indications may be performed as noted.  Right Findings: +----------+------------+---------+-----------+----------+---------------------+ RIGHT     CompressiblePhasicitySpontaneousProperties       Summary        +----------+------------+---------+-----------+----------+---------------------+ IJV           Full       Yes       Yes                                    +----------+------------+---------+-----------+----------+---------------------+ Subclavian               Yes       Yes                                    +----------+------------+---------+-----------+----------+---------------------+ Axillary                 Yes       Yes                                    +----------+------------+---------+-----------+----------+---------------------+ Brachial      Full       Yes       Yes                                    +----------+------------+---------+-----------+----------+---------------------+ Radial        Full                                                        +----------+------------+---------+-----------+----------+---------------------+ Ulnar                                               patent with color and                                                            Doppler        +----------+------------+---------+-----------+----------+---------------------+ Cephalic      Full                                                         +----------+------------+---------+-----------+----------+---------------------+ Basilic       Full                                                        +----------+------------+---------+-----------+----------+---------------------+  Left Findings: +----------+------------+---------+-----------+----------+-------+ LEFT  CompressiblePhasicitySpontaneousPropertiesSummary +----------+------------+---------+-----------+----------+-------+ Subclavian               Yes       Yes                      +----------+------------+---------+-----------+----------+-------+  Summary:  Right: No evidence of deep vein thrombosis in the upper extremity. No evidence of superficial vein thrombosis in the upper extremity.  Left: No evidence of superficial vein thrombosis in the upper extremity.  *See table(s) above for measurements and observations.     Preliminary      Scheduled Meds:   . carvedilol  3.125 mg Oral BID WC  . cholecalciferol  4,000 Units Oral Daily  . ferrous sulfate  325 mg Oral Q breakfast  . furosemide  40 mg Oral BID  . levothyroxine  100 mcg Oral QAC breakfast  . multivitamin with minerals  1 tablet Oral Daily  . potassium chloride SA  40 mEq Oral Daily  . pravastatin  20 mg Oral Daily  . sertraline  100 mg Oral Daily  . Warfarin - Pharmacist Dosing Inpatient   Does not apply q1600    Continuous Infusions:   . lactated ringers 30 mL/hr at 08/15/20 0917  . piperacillin-tazobactam (ZOSYN)  IV 3.375 g (08/15/20 0231)  . vancomycin       LOS: 1 day     Vernell Leep, MD, [place], [facility]. [facility]    To contact the attending provider between 7A-7P or the covering provider during after hours 7P-7A, please log into the web site [email or web address] and access using universal [place] password for that web site. If you do not have the password, please call the hospital operator.  08/15/2020, 1:31 PM

## 2020-08-15 NOTE — Progress Notes (Signed)
  Echocardiogram 2D Echocardiogram has been performed.  Randa Lynn Karlissa Aron 08/15/2020, 4:47 PM

## 2020-08-15 NOTE — Plan of Care (Signed)

## 2020-08-15 NOTE — Progress Notes (Signed)
VASCULAR LAB    Right upper extremity venous duplex has been performed.  See CV proc for preliminary results.   Marsean Elkhatib, RVT 08/15/2020, 11:32 AM

## 2020-08-15 NOTE — Progress Notes (Signed)
Patient refused CPAP for the night  

## 2020-08-15 NOTE — Consult Note (Addendum)
Cardiology Consultation:   Patient ID: [name] MRN: [id number]; DOB: [birth year]  Admit date: 08/14/2020 Date of Consult: 08/15/2020  PCP:  Maurice Small, MD   [facility] Providers Cardiologist:  Sanda Klein, MD        Patient Profile:   [name] is a 75 y.o. female with a hx of combined systolic and diastolic HF, MR with hx of mechanical AVR 2006, ST jude, permanent atrial fib with hx of ablation X 2 most recent 2017, failure to maintain SR on dofetilide and amiodarone, OSA on CPAP, hx of hemidiaphragm paralysis following heart surgery, mild aneurysmal ascending aorta, 47 mm by 2021, normal cors by remote cath 2005, and 2 epidoses of bleeding subdural hematoma and spontaneous rectus sheath hematoma and recent CHF and mod to severe MR who is being seen 08/15/2020 for the evaluation of HF and valves at the request of Dr. Algis Liming with admit for chills, dyspnea, Rt arm cellulitis, sepsis.  History of Present Illness:   Ms. [name] with above hx and recent CHF with increase of lasix and entresto in April this year.  Last nuc study 08/2019 was neg for ischemia.  She has mod to severe MR and she noted she would undergo procedure to replace/repair to improve her quality of life.    Structural team noted that it did appear to be a clippable valve. The fossa looks approachable for transseptal puncture in both the SAXB and Bicaval views. LA dimensions are large enough for device steering and straddle. The MR jet is broad based and centrally located. The posterior leaflet is measuring 1 cm in the 142 LVOT view. Gradient is 3 mmHG -recommended after review of TEE done 07/12/20 with EF 50-55%, global hypokinesis, LA severely dilated, no thrombus RT atria severely dilated, Mod to severe MVR  Mean gradient is 3.0 mHg TR is moderate.  Aortic valve mean gradient measures 3.0 mmHg. Aortic valve Vmax measures 1.09 m/s.  There is mild dilatation of the ascending aorta, measuring 40 mm. There  is Moderate (Grade III) atheroma plaque involving the descending aorta. Evidence of atrial level shunting detected by color flow Doppler.  There is a small patent foramen ovale with predominantly left to right shunting across the atrial septum.   Had appt with Dr. Damian Leavell heart team today.   INR goal after prior subdural bleed in 2-2.5 per neuro.    Pt presented to ER 08/14/20 with chills, weakness and Rt forearm cellulitis and DOE.  Her temp was 103.3 with WBC 16k, hgb 12.4 plts 236, Cr 1.02 BP 106/56.  Placed on IV abx.  IV fluids.   Her troponin hs was 42 to 36, no acute EKG changes.  She was not hypoxic.  With soft BP her entresto is held.  Along with coreg.   EKG:  The EKG was personally reviewed and demonstrates:  Atrial fib rate controlled with RBBB and no changes from 06/2020. Telemetry:  Telemetry was personally reviewed and demonstrates:  Atrial fib Today Na 138, k+ 3.2 glucose 152, Cr 0.90 Mg+ 2.1  BNP 598 Hs troponin 36, 34 Hgb 10.2, Wbc 10.2  INR 2.9  TSH 4.836  Free T4 1.13  BP 121/53 P 60   Breathing better today, no chest pain.  Past Medical History:  Diagnosis Date  . Anxiety   . Aortic stenosis    status post aortic valve replacement with St. Jude mechanical prosthesis  . 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   70mm by echo 06/2016 and 63mm  by CT angin 2016  . Chronic anticoagulation   . 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 Depression   . DJD (degenerative joint disease) of knee   . Dyslipidemia   . GERD (gastroesophageal reflux disease)    "several years ago; none since" (11/12/2012)  . Hyperlipidemia   . Hyperlipidemia LDL goal <70 01/19/2017  . Hypertension   . Left atrial enlargement   . Mitral regurgitation 06/25/2016   Mild moderate MR by echo 06/2016  . Obesity   . Persistent atrial fibrillation (HCC)    s/p afib ablation x 2 at [facility]  . Sleep apnea    On CPAP at 12cm H2O  . 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 2.2 shortly after AVR - now cleared by neurosurgery to maintain INR 2-2.5    Past Surgical History:  Procedure Laterality Date  . BURR HOLE FOR SUBDURAL HEMATOMA  2006  . CARDIAC VALVE REPLACEMENT  2006   St. Jude AVR  . CARDIOVERSION N/A 07/22/2012   Procedure: CARDIOVERSION;  Surgeon: [name], MD;  Location: [facility] ENDOSCOPY;  Service: Cardiovascular;  Laterality: N/A;  . CARDIOVERSION N/A 11/25/2012   Procedure: CARDIOVERSION;  Surgeon: [name], MD;  Location: [facility] ENDOSCOPY;  Service: Cardiovascular;  Laterality: N/A;  . CARDIOVERSION N/A 12/30/2012   Procedure: CARDIOVERSION;  Surgeon: [name], MD;  Location: [facility] ENDOSCOPY;  Service: Cardiovascular;  Laterality: N/A;  h&p in file-HW   . CARDIOVERSION N/A 03/30/2013   Procedure: CARDIOVERSION;  Surgeon: [name], MD;  Location: [facility] ENDOSCOPY;  Service: Cardiovascular;  Laterality: N/A;  . ELECTROPHYSIOLOGIC STUDY  11/2013   Afib ablation x 2 (11/2013 and 10/2015) at [facility] by Dr Clyda Hurdle with recurrence post ablation  . KNEE ARTHROSCOPY Left 1980's   "2" (11/12/2012)  . KNEE SURGERY Left 1980's   "after 2 scopes they went in and did some kind of OR" (11/12/2012)  . TEE WITHOUT CARDIOVERSION N/A 07/22/2012   Procedure: TRANSESOPHAGEAL ECHOCARDIOGRAM (TEE);  Surgeon: [name], MD;  Location: [facility] ENDOSCOPY;  Service: Cardiovascular;  Laterality: N/A;  Rm 2034  . TEE WITHOUT CARDIOVERSION N/A 10/07/2013   Procedure: TRANSESOPHAGEAL ECHOCARDIOGRAM (TEE);  Surgeon: [name], MD;  Location: [facility] ENDOSCOPY;  Service: Cardiovascular;  Laterality: N/A;  . TEE WITHOUT CARDIOVERSION N/A 07/12/2020   Procedure: TRANSESOPHAGEAL ECHOCARDIOGRAM (TEE);  Surgeon: Sanda Klein, MD;  Location: [place];  Service: Cardiovascular;  Laterality: N/A;  . TOTAL KNEE ARTHROPLASTY Left 11/05/2014   Procedure: TOTAL KNEE ARTHROPLASTY;  Surgeon: Dorna Leitz, MD;  Location: [place];  Service: Orthopedics;  Laterality: Left;  .  TUBAL LIGATION  1980's     Home Medications:  Prior to Admission medications   Medication Sig Start Date End Date Taking? Authorizing Provider  acetaminophen (TYLENOL) 500 MG tablet Take 1,000 mg by mouth daily as needed for mild pain.   Yes [provider]  ALPRAZolam Duanne Moron) 0.5 MG tablet Take 0.5 mg by mouth 2 (two) times daily as needed for anxiety.  01/11/17  Yes [provider]  calcium-vitamin D (OSCAL WITH D) 500-200 MG-UNIT per tablet Take 2 tablets by mouth daily.    Yes [provider]  carvedilol (COREG) 3.125 MG tablet TAKE 1 TABLET(3.125 MG) BY MOUTH TWICE DAILY WITH A MEAL Patient taking differently: Take 3.125 mg by mouth 2 (two) times daily with a meal. 04/11/20  Yes Buford Dresser, MD  Cholecalciferol (VITAMIN D) 2000 UNITS tablet  Take 4,000 Units by mouth daily.   Yes [provider]  famotidine (PEPCID) 10 MG tablet Take 10 mg by mouth 2 (two) times daily as needed for heartburn or indigestion.   Yes [provider]  ferrous sulfate 325 (65 FE) MG tablet Take 325 mg by mouth daily with breakfast.   Yes [provider]  furosemide (LASIX) 40 MG tablet Take 1 tablet (40 mg total) by mouth 2 (two) times daily. For weights above 212 pounds, take 80mg  in the morning and 40mg  in the evening. 07/20/20  Yes Croitoru, Mihai, MD  levothyroxine (SYNTHROID, LEVOTHROID) 100 MCG tablet Take 100 mcg daily before breakfast by mouth.   Yes [provider]  Multiple Vitamin (MULTIVITAMIN WITH MINERALS) TABS Take 1 tablet by mouth daily.   Yes [provider]  oxymetazoline (AFRIN) 0.05 % nasal spray Place 1 spray into both nostrils daily as needed for congestion.   Yes [provider]  potassium chloride SA (KLOR-CON) 20 MEQ tablet TAKE 2 TABLETS(40 MEQ) BY MOUTH DAILY Patient taking differently: Take 40 mEq by mouth daily. 07/20/20  Yes Croitoru, Mihai, MD  pravastatin (PRAVACHOL) 20 MG tablet TAKE 1 TABLET BY  MOUTH EVERY DAY Patient taking differently: Take 20 mg by mouth daily. 04/11/20  Yes Buford Dresser, MD  sacubitril-valsartan (ENTRESTO) 97-103 MG Take 1 tablet by mouth 2 (two) times daily. 07/20/20  Yes Croitoru, Mihai, MD  sertraline (ZOLOFT) 100 MG tablet Take 100 mg by mouth daily. 07/09/19  Yes [provider]  traMADol (ULTRAM) 50 MG tablet Take 50 mg by mouth every 12 (twelve) hours as needed for severe pain. 12/22/19  Yes [provider]  tretinoin (RETIN-A) 0.05 % cream Apply 1 application topically at bedtime.    Yes [provider]  warfarin (COUMADIN) 4 MG tablet TAKE 1 to 2 TABLETs  Daily as directed by the coumadin clinic Patient taking differently: Take 4-8 mg by mouth See admin instructions. Taking 2 tabs (8mg ) on Mon, wed, Friday. All other days taking 1 tab (4 mg) 08/08/20  Yes Croitoru, Mihai, MD  zolpidem (AMBIEN) 10 MG tablet Take 5 mg by mouth at bedtime as needed for sleep.   Yes [provider]  amoxicillin (AMOXIL) 500 MG capsule Prior to dentist. 07/05/20   [provider]    Inpatient Medications: Scheduled Meds: . carvedilol  3.125 mg Oral BID WC  . cholecalciferol  4,000 Units Oral Daily  . ferrous sulfate  325 mg Oral Q breakfast  . furosemide  40 mg Oral BID  . levothyroxine  100 mcg Oral QAC breakfast  . multivitamin with minerals  1 tablet Oral Daily  . potassium chloride SA  40 mEq Oral Daily  . pravastatin  20 mg Oral Daily  . sertraline  100 mg Oral Daily  . Warfarin - Pharmacist Dosing Inpatient   Does not apply q1600   Continuous Infusions: . lactated ringers 30 mL/hr at 08/15/20 0917  . piperacillin-tazobactam (ZOSYN)  IV 3.375 g (08/15/20 0231)  . vancomycin     PRN Meds: acetaminophen, ALPRAZolam, melatonin, ondansetron (ZOFRAN) IV  Allergies:    Allergies  Allergen Reactions  . Codeine Anaphylaxis    Jerking, involuntary jerking   . Metoprolol     depression   . Propoxyphene Other (See  Comments)    Bad vivid dreams  . Dilantin [Phenytoin Sodium Extended] Rash and Itching    Social History:   Social History   Socioeconomic History  . Marital status:  Married    Spouse name: Not on file  . Number of children: Not on file  . Years of education: Not on file  . Highest education level: Not on file  Occupational History  . Not on file  Tobacco Use  . Smoking status: Never Smoker  . Smokeless tobacco: Never Used  Substance and Sexual Activity  . Alcohol use: No    Comment: 11/12/2012 "no alcohol in the last 9 years"  . Drug use: No  . Sexual activity: Not Currently  Other Topics Concern  . Not on file  Social History Narrative   Pt lives in [place] with spouse.  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 Days of Exercise per Week: 0 days  . Minutes of Exercise per Session: 0 min  Stress: Not on file  Social Connections: Unknown  . Frequency of Communication with Friends and Family: Once a week  . Frequency of Social Gatherings with Friends and Family: Not on file  . Attends Religious Services: 1 to 4 times per year  . Active Member of Clubs or Organizations: No  . Attends Archivist Meetings: Never  . Marital Status: Married  Human resources officer Violence: Not on file    Family History:    Family History  Problem Relation Age of Onset  . Lung cancer Mother   . Hypertension Mother   . Hyperlipidemia Father   . Hypertension Father   . Heart disease Brother      ROS:  Please see the history of present illness.  General:no colds or fevers, no weight changes Skin:no rashes or ulcers HEENT:no blurred vision, no congestion CV:see HPI PUL:see HPI GI:no diarrhea constipation or melena, no indigestion GU:no hematuria, no dysuria MS:no joint pain, no claudication Neuro:no syncope, no lightheadedness Endo:no  diabetes, no thyroid disease  All other ROS reviewed and negative.     Physical Exam/Data:   Vitals:   08/15/20 0400 08/15/20 0728 08/15/20 0812 08/15/20 1117  BP: (!) 95/45 (!) 109/51  (!) 121/53  Pulse: (!) 55 60 63 60  Resp: 18 18  18   Temp: 98.4 F (36.9 C) 98.4 F (36.9 C)  97.9 F (36.6 C)  TempSrc: Oral Oral  Oral  SpO2: 100% 100%  100%  Weight:      Height:        Intake/Output Summary (Last 24 hours) at 08/15/2020 1313 Last data filed at 08/15/2020 0805 Gross per 24 hour  Intake 4243.45 ml  Output 400 ml  Net 3843.45 ml   Last 3 Weights 08/15/2020 08/14/2020 07/20/2020  Weight (lbs) 218 lb 0.6 oz 210 lb 219 lb 3.2 oz  Weight (kg) 98.9 kg 95.255 kg 99.428 kg     Body mass index is 34.15 kg/m.  General:  Well nourished, well developed, in no acute distress, overall feeling better  HEENT: normal Lymph: no adenopathy Neck: no JVD Endocrine:  No thryomegaly Vascular: No carotid bruits; pedal pulses 2+ bilaterally  Cardiac:  normal S1, S2; RRR; no murmur but crisp closure of mechanical valve Lungs:  clear to auscultation bilaterally, no wheezing, rhonchi or rales  Abd: soft, nontender, no hepatomegaly  Ext: no edema Musculoskeletal:  No deformities, BUE and BLE strength normal and equal, Rt upper ext with redness to elbow Skin: warm and dry  Neuro:  Alert and oriented X 3, MAE follows  commands, no focal abnormalities noted Psych:  Normal affect     Relevant CV Studies: TEE 07/12/20 IMPRESSIONS     1. Left ventricular ejection fraction, by estimation, is 50 to 55%. The  left ventricle has low normal function. The left ventricle demonstrates  global hypokinesis. Left ventricular diastolic function could not be  evaluated.   2. Left atrial size was severely dilated. No left atrial/left atrial  appendage thrombus was detected. The LAA emptying velocity was 50 cm/s.  (the rhythm was atrial flutter).   3. Right atrial size was severely dilated.   4. The mitral valve  is rheumatic. Moderate to severe mitral valve  regurgitation. No evidence of mitral stenosis. The mean mitral valve  gradient is 3.0 mmHg.   5. Tricuspid valve regurgitation is moderate.   6. The aortic valve has been repaired/replaced. Aortic valve  regurgitation is trivial. No aortic stenosis is present. There is a 19 mm  mechanical valve present in the aortic position. Echo findings are  consistent with normal structure and function of  the aortic valve prosthesis. Aortic valve mean gradient measures 3.0 mmHg.  Aortic valve Vmax measures 1.09 m/s.   7. Aortic dilatation noted. There is mild dilatation of the ascending  aorta, measuring 40 mm. There is Moderate (Grade III) atheroma plaque  involving the descending aorta.   8. Evidence of atrial level shunting detected by color flow Doppler.  There is a small patent foramen ovale with predominantly left to right  shunting across the atrial septum.   9. Right ventricular systolic function is normal. The right ventricular  size is normal. There is mildly elevated pulmonary artery systolic  pressure.   FINDINGS   Left Ventricle: Left ventricular ejection fraction, by estimation, is 50  to 55%. The left ventricle has low normal function. The left ventricle  demonstrates global hypokinesis. The left ventricular internal cavity size  was normal in size. Left  ventricular diastolic function could not be evaluated due to atrial  fibrillation. Left ventricular diastolic function could not be evaluated.   Right Ventricle: The right ventricular size is normal. No increase in  right ventricular wall thickness. Right ventricular systolic function is  normal. There is mildly elevated pulmonary artery systolic pressure. The  tricuspid regurgitant velocity is 2.75   m/s, and with an assumed right atrial pressure of 3 mmHg, the estimated  right ventricular systolic pressure is 22.0 mmHg.   Left Atrium: Left atrial size was severely dilated. No  left atrial/left  atrial appendage thrombus was detected. The LAA emptying velocity was 50  cm/s.   Right Atrium: Right atrial size was severely dilated.   Pericardium: There is no evidence of pericardial effusion.   Mitral Valve: Posterior leaflet length 11 mm. Maximum mitral leaflet  thickness 3-4 mm. Vena contract 3 mm. By 2D PISA mode, ERO area 0.25 mm,  regurgitant volume 35 ml, regurgitant fraction 43%. Peal ERO area by 3D  measurment was more severe at 0.69 mm.   There is no pulmonary vein flow reversal. The mitral valve is rheumatic.  Mild mitral annular calcification. Moderate to severe mitral valve  regurgitation, with centrally-directed jet. No evidence of mitral valve  stenosis. MV peak gradient, 5.7 mmHg. The   mean mitral valve gradient is 3.0 mmHg.   Tricuspid Valve: The tricuspid valve is normal in structure. Tricuspid  valve regurgitation is moderate.   Aortic Valve: The aortic valve has been repaired/replaced. Aortic valve  regurgitation is trivial. No aortic stenosis is present.  Aortic valve mean  gradient measures 3.0 mmHg. Aortic valve peak gradient measures 4.8 mmHg.  Aortic valve area, by VTI  measures 2.04 cm. There is a 19 mm mechanical valve present in the aortic  position. Echo findings are consistent with normal structure and function  of the aortic valve prosthesis.   Pulmonic Valve: The pulmonic valve was normal in structure. Pulmonic valve  regurgitation is mild.   Aorta: Aortic dilatation noted. There is mild dilatation of the ascending  aorta, measuring 40 mm. There is moderate (Grade III) atheroma plaque  involving the descending aorta.   IAS/Shunts: Evidence of atrial level shunting detected by color flow  Doppler. A small patent foramen ovale is detected with predominantly left  to right shunting across the atrial septum.    08/15/20  Venous duplex   Summary:     Right:  No evidence of deep vein thrombosis in the upper extremity. No  evidence of  superficial vein thrombosis in the upper extremity.     Left:  No evidence of superficial vein thrombosis in the upper extremity.      Laboratory Data:  High Sensitivity Troponin:   Recent Labs  Lab 08/14/20 1730 08/14/20 1814 08/14/20 2349 08/15/20 0032  TROPONINIHS 36* 42* 36* 34*     Chemistry Recent Labs  Lab 08/14/20 1730 08/14/20 1736 08/15/20 0830  NA 138 140 138  K 4.2 4.1 3.2*  CL 106  --  108  CO2 23  --  24  GLUCOSE 121*  --  152*  BUN 28*  --  20  CREATININE 1.02*  --  0.90  CALCIUM 9.4  --  8.3*  GFRNONAA 57*  --  >60  ANIONGAP 9  --  6    Recent Labs  Lab 08/14/20 1730  PROT 7.4  ALBUMIN 3.8  AST 26  ALT 19  ALKPHOS 63  BILITOT 0.9   Hematology Recent Labs  Lab 08/14/20 1730 08/14/20 1736 08/15/20 0553  WBC 16.0*  --  10.2  RBC 3.86*  --  3.15*  HGB 12.4 12.2 10.2*  HCT 37.2 36.0 30.8*  MCV 96.4  --  97.8  MCH 32.1  --  32.4  MCHC 33.3  --  33.1  RDW 12.5  --  13.2  PLT 236  --  190   BNP Recent Labs  Lab 08/15/20 0032  BNP 598.4*    DDimer No results for input(s): DDIMER in the last 168 hours.   Radiology/Studies:  DG Chest 2 View  Result Date: 08/14/2020 CLINICAL DATA:  Shortness of breath. EXAM: CHEST - 2 VIEW COMPARISON:  Chest radiograph dated 09/24/2019. FINDINGS: The heart is enlarged. Vascular calcifications are seen in the aortic arch. Median sternotomy wires and an aortic valve prosthesis are noted. Mild bilateral lower lung atelectasis/airspace disease. There is no pleural effusion or pneumothorax. Degenerative changes are seen in the spine. IMPRESSION: Mild bilateral lower lung atelectasis/airspace disease. Cardiomegaly. Aortic Atherosclerosis (ICD10-I70.0). Electronically Signed   By: Zerita Boers M.D.   On: 08/14/2020 17:25   VAS [place] UPPER EXTREMITY VENOUS DUPLEX  Result Date: 08/15/2020 UPPER VENOUS STUDY  Patient Name:  TAMICKA KISPERT  Date of Exam:   08/15/2020 Medical Rec #: [id number]             Accession #:    [id number] Date of Birth: Aug 17, 1945             Patient Gender: F Patient Age:   17Y Exam [facility] Procedure:      VAS [place] UPPER EXTREMITY VENOUS DUPLEX Referring Phys: [id number] ANAND D HONGALGI --------------------------------------------------------------------------------  Indications: Edema, and Erythema Risk Factors: Sepsis, INR 2.4. Anticoagulation: Coumadin. Comparison Study: No prior study on file Performing Technologist: Sharion Dove RVS  Examination Guidelines: A complete evaluation includes B-mode imaging, spectral Doppler, color Doppler, and power Doppler as needed of all accessible portions of each vessel. Bilateral testing is considered an integral part of a complete examination. Limited examinations for reoccurring indications may be performed as noted.  Right Findings: +----------+------------+---------+-----------+----------+---------------------+ RIGHT     CompressiblePhasicitySpontaneousProperties       Summary        +----------+------------+---------+-----------+----------+---------------------+ IJV           Full       Yes       Yes                                    +----------+------------+---------+-----------+----------+---------------------+ Subclavian               Yes       Yes                                    +----------+------------+---------+-----------+----------+---------------------+ Axillary                 Yes       Yes                                    +----------+------------+---------+-----------+----------+---------------------+ Brachial      Full       Yes       Yes                                    +----------+------------+---------+-----------+----------+---------------------+ Radial        Full                                                        +----------+------------+---------+-----------+----------+---------------------+ Ulnar                                               patent  with color and                                                            Doppler        +----------+------------+---------+-----------+----------+---------------------+ Cephalic      Full                                                        +----------+------------+---------+-----------+----------+---------------------+  Basilic       Full                                                        +----------+------------+---------+-----------+----------+---------------------+  Left Findings: +----------+------------+---------+-----------+----------+-------+ LEFT      CompressiblePhasicitySpontaneousPropertiesSummary +----------+------------+---------+-----------+----------+-------+ Subclavian               Yes       Yes                      +----------+------------+---------+-----------+----------+-------+  Summary:  Right: No evidence of deep vein thrombosis in the upper extremity. No evidence of superficial vein thrombosis in the upper extremity.  Left: No evidence of superficial vein thrombosis in the upper extremity.  *See table(s) above for measurements and observations.     Preliminary      Assessment and Plan:   1. Sepsis, due to Rt arm cellulitis. With fever, Blood cultures pending, on ABX. Per IM 2. AVR on Coumadin and hx of subdural hematoma, on coumadin with goal INR per neuro between 2-2.5  today 2.9 - continue coumadin.  3. Mod to severe MR has had TEE and structural heart has reviewed and valve is clippable - they recommended starting with NTW assessing for gradient.  Was to see Dr. Burt Knack today but she cancelled her appt with admit.   4. Permanent atrial fib rate controlled Hx of 2 ablation.   5. Chronic systolic HF EF improved on last TEE. 6. OSA on CPAP continue 7. Ascending aorta dilatation monitor   Risk Assessment/Risk Scores:        New York Heart Association (NYHA) Functional Class NYHA Class II  CHA2DS2-VASc Score = 5  This indicates a  7.2% annual risk of stroke. The patient's score is based upon: CHF History: Yes HTN History: Yes Diabetes History: No Stroke History: No Vascular Disease History: No Age Score: 2 Gender Score: 1          For questions or updates, please contact [facility] Please consult [email or web address] for contact info under    Signed, Cecilie Kicks, NP  08/15/2020 1:13 PM  Personally seen and examined. Agree with APP above with the following comments: Briefly 75 yo W s/p M-AVR in 2006 with prior SDH (Agreed INR goal post was 2.25 without ASA) Severe MR and HFrEF; TAA (moderate 47 mm), Permanent AF with planned Edge-to-edge repair evaluation who presents with cellulities. Patient notes no SOB, CP, DOE or palpitations Exam notable for both holosystolic and systolic murmur; difficult click; notable warm and red colored right arm.  Labs notable for INR 2.9 Would recommend  - limited TTE in conjunction with f/u blood cultures - low dose Coreg 3.125 mg PO BID - lasix 40 mg PO BID - statin is reasonable - hold low dose Entresto in the setting of infection is reasonable; goal will be to add it back post DC - coumadin dosing with pharmacy team appreciated:  2-2.5 had been decided in prior discussion from SDH balance. - there are no plans for TAA surgery  Rudean Haskell, MD [facility]  [address] [place], [place] [address] [phone]  2:04 PM

## 2020-08-16 DIAGNOSIS — L03113 Cellulitis of right upper limb: Secondary | ICD-10-CM

## 2020-08-16 DIAGNOSIS — I340 Nonrheumatic mitral (valve) insufficiency: Secondary | ICD-10-CM

## 2020-08-16 DIAGNOSIS — Z952 Presence of prosthetic heart valve: Secondary | ICD-10-CM

## 2020-08-16 LAB — CBC
HCT: 32.8 % — ABNORMAL LOW (ref 36.0–46.0)
Hemoglobin: 10.6 g/dL — ABNORMAL LOW (ref 12.0–15.0)
MCH: 31.6 pg (ref 26.0–34.0)
MCHC: 32.3 g/dL (ref 30.0–36.0)
MCV: 97.9 fL (ref 80.0–100.0)
Platelets: 186 10*3/uL (ref 150–400)
RBC: 3.35 MIL/uL — ABNORMAL LOW (ref 3.87–5.11)
RDW: 12.9 % (ref 11.5–15.5)
WBC: 9.2 10*3/uL (ref 4.0–10.5)
nRBC: 0 % (ref 0.0–0.2)

## 2020-08-16 LAB — BASIC METABOLIC PANEL
Anion gap: 7 (ref 5–15)
BUN: 13 mg/dL (ref 8–23)
CO2: 25 mmol/L (ref 22–32)
Calcium: 8.3 mg/dL — ABNORMAL LOW (ref 8.9–10.3)
Chloride: 108 mmol/L (ref 98–111)
Creatinine, Ser: 0.81 mg/dL (ref 0.44–1.00)
GFR, Estimated: >60 mL/min (ref >60)
Glucose, Bld: 100 mg/dL — ABNORMAL HIGH (ref 70–99)
Potassium: 3.6 mmol/L (ref 3.5–5.1)
Sodium: 140 mmol/L (ref 135–145)

## 2020-08-16 LAB — PROTIME-INR
INR: 3 — ABNORMAL HIGH (ref 0.8–1.2)
Prothrombin Time: 31 seconds — ABNORMAL HIGH (ref 11.4–15.2)

## 2020-08-16 LAB — URINE CULTURE: Culture: <10000 — AB

## 2020-08-16 MED ORDER — WARFARIN SODIUM 2 MG PO TABS
2.0000 mg | ORAL_TABLET | Freq: Once | ORAL | Status: AC
  Administered 2020-08-16: 2 mg via ORAL
  Filled 2020-08-16: qty 1

## 2020-08-16 MED ORDER — CEFAZOLIN SODIUM-DEXTROSE 2-4 GM/100ML-% IV SOLN
2.0000 g | Freq: Three times a day (TID) | INTRAVENOUS | Status: DC
  Administered 2020-08-16 – 2020-08-18 (×5): 2 g via INTRAVENOUS
  Filled 2020-08-16 (×9): qty 100

## 2020-08-16 MED ORDER — POTASSIUM CHLORIDE 10 MEQ/100ML IV SOLN
10.0000 meq | INTRAVENOUS | Status: AC
  Administered 2020-08-16 (×2): 10 meq via INTRAVENOUS
  Filled 2020-08-16 (×2): qty 100

## 2020-08-16 NOTE — Progress Notes (Signed)
[place] for warfarin Indication: atrial fibrillation  Allergies  Allergen Reactions  . Codeine Anaphylaxis    Jerking, involuntary jerking   . Metoprolol     depression   . Propoxyphene Other (See Comments)    Bad vivid dreams  . Dilantin [Phenytoin Sodium Extended] Rash and Itching    Patient Measurements: Height: 5\' 7"  (170.2 cm) Weight: 98.3 kg (216 lb 11.4 oz) IBW/kg (Calculated) : 61.6  Vital Signs: Temp: 98.9 F (37.2 C) (05/10 1544) Temp Source: Oral (05/10 1544) BP: 118/59 (05/10 1544) Pulse Rate: 61 (05/10 1544)  Labs: Recent Labs    08/14/20 1730 08/14/20 1736 08/14/20 1814 08/14/20 2349 08/15/20 0032 08/15/20 0553 08/15/20 0830 08/16/20 0045  HGB 12.4 12.2  --   --   --  10.2*  --  10.6*  HCT 37.2 36.0  --   --   --  30.8*  --  32.8*  PLT 236  --   --   --   --  190  --  186  APTT 47*  --   --   --   --   --   --   --   LABPROT 25.8*  --   --   --   --   --  30.4* 31.0*  INR 2.4*  --   --   --   --   --  2.9* 3.0*  CREATININE 1.02*  --   --   --   --   --  0.90 0.81  TROPONINIHS 36*  --  42* 36* 34*  --   --   --     Estimated Creatinine Clearance: 72.3 mL/min (by C-G formula based on SCr of 0.81 mg/dL).   Medical History: Past Medical History:  Diagnosis Date  . Anxiety   . Aortic stenosis    status post aortic valve replacement with St. Jude mechanical prosthesis  . 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   58mm by echo 06/2016 and 59mm by CT angin 2016  . Chronic anticoagulation   . 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 Depression   . DJD (degenerative joint disease) of knee   . Dyslipidemia   . GERD (gastroesophageal reflux disease)    "several years ago; none since" (11/12/2012)  . Hyperlipidemia   . Hyperlipidemia LDL goal <70 01/19/2017  . Hypertension   . Left atrial enlargement   . Mitral regurgitation 06/25/2016   Mild moderate MR by echo 06/2016   . Obesity   . Persistent atrial fibrillation (HCC)    s/p afib ablation x 2 at [facility]  . Sleep apnea    On CPAP at 12cm H2O  . 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 - now cleared by neurosurgery to maintain INR 2-2.5     Assessment: 57 yoF with hx mAVR and AFib admitted with sepsis. INR goal 2-2.5 per clinic notes (due to history of subdural hematoma) -INR slightly above goal at 3.  *Home dose 8mg  MWF, 4mg  AODs  Goal of Therapy:  INR 2-2.5 Monitor platelets by anticoagulation protocol: Yes   Plan:  -Reduce today's coumadin dose to 2mg   -Daily PT/INR  Arrie Senate, PharmD, BCPS, [facility] Clinical Pharmacist [phone] Please check AMION for all [facility] numbers 08/16/2020

## 2020-08-16 NOTE — Progress Notes (Signed)
Patient having few runs of non-sustained VT tonight. Patient had 4 runs, 3 runs and 3 runs @ around midnight, 1 and 2 am per CCMD. MD notified. Received order for potassium chloride IVPB * 2.

## 2020-08-16 NOTE — Progress Notes (Signed)
Progress Note  Patient Name: [name] Date of Encounter: 08/16/2020  Primary Cardiologist: Sanda Klein, MD   Subjective   Overnight- hypokalemia with NSVT (three beats) Patient noted that she feels a bit more tired and doesn't have her same strength as prior.  No change in since 4/22.  Inpatient Medications    Scheduled Meds: . carvedilol  3.125 mg Oral BID WC  . cholecalciferol  4,000 Units Oral Daily  . ferrous sulfate  325 mg Oral Q breakfast  . furosemide  40 mg Oral BID  . levothyroxine  100 mcg Oral QAC breakfast  . multivitamin with minerals  1 tablet Oral Daily  . potassium chloride SA  40 mEq Oral BID  . pravastatin  20 mg Oral Daily  . sertraline  100 mg Oral Daily  . Warfarin - Pharmacist Dosing Inpatient   Does not apply q1600   Continuous Infusions: . piperacillin-tazobactam (ZOSYN)  IV 3.375 g (08/16/20 0553)  . vancomycin 1,000 mg (08/15/20 2240)   PRN Meds: acetaminophen, ALPRAZolam, melatonin, ondansetron (ZOFRAN) IV   Vital Signs    Vitals:   08/15/20 1704 08/15/20 1925 08/15/20 2342 08/16/20 0408  BP: 135/87 (!) 111/54 (!) 101/52 (!) 109/53  Pulse: 67 72 76 61  Resp:  20 18 17   Temp:  98.8 F (37.1 C) 98.5 F (36.9 C) 98.4 F (36.9 C)  TempSrc:  Oral Oral Oral  SpO2:  97% 99% 97%  Weight:    98.3 kg  Height:        Intake/Output Summary (Last 24 hours) at 08/16/2020 0745 Last data filed at 08/16/2020 0742 Gross per 24 hour  Intake 1257.81 ml  Output --  Net 1257.81 ml   Filed Weights   08/14/20 2023 08/15/20 0012 08/16/20 0408  Weight: 95.3 kg 98.9 kg 98.3 kg    Telemetry    PVC triplets; AF and rate controlled - Personally Reviewed  ECG    No new - Personally Reviewed  Physical Exam   GEN: No acute distress.   Neck: No JVD at 90 degrees Cardiac: RRR, Prominent mechanical heart sounds; soft systolic murmur Respiratory: Clear to auscultation bilaterally. GI: Soft, nontender, non-distended  MS: Non-pitting  edema; No deformity. Neuro:  Nonfocal  Psych: Normal affect   Labs    Chemistry Recent Labs  Lab 08/14/20 1730 08/14/20 1736 08/15/20 0830 08/16/20 0045  NA 138 140 138 140  K 4.2 4.1 3.2* 3.6  CL 106  --  108 108  CO2 23  --  24 25  GLUCOSE 121*  --  152* 100*  BUN 28*  --  20 13  CREATININE 1.02*  --  0.90 0.81  CALCIUM 9.4  --  8.3* 8.3*  PROT 7.4  --   --   --   ALBUMIN 3.8  --   --   --   AST 26  --   --   --   ALT 19  --   --   --   ALKPHOS 63  --   --   --   BILITOT 0.9  --   --   --   GFRNONAA 57*  --  >60 >60  ANIONGAP 9  --  6 7     Hematology Recent Labs  Lab 08/14/20 1730 08/14/20 1736 08/15/20 0553 08/16/20 0045  WBC 16.0*  --  10.2 9.2  RBC 3.86*  --  3.15* 3.35*  HGB 12.4 12.2 10.2* 10.6*  HCT 37.2 36.0 30.8*  32.8*  MCV 96.4  --  97.8 97.9  MCH 32.1  --  32.4 31.6  MCHC 33.3  --  33.1 32.3  RDW 12.5  --  13.2 12.9  PLT 236  --  190 186    Cardiac EnzymesNo results for input(s): TROPONINI in the last 168 hours. No results for input(s): TROPIPOC in the last 168 hours.   BNP Recent Labs  Lab 08/15/20 0032  BNP 598.4*     DDimer No results for input(s): DDIMER in the last 168 hours.   Radiology    DG Chest 2 View  Result Date: 08/14/2020 CLINICAL DATA:  Shortness of breath. EXAM: CHEST - 2 VIEW COMPARISON:  Chest radiograph dated 09/24/2019. FINDINGS: The heart is enlarged. Vascular calcifications are seen in the aortic arch. Median sternotomy wires and an aortic valve prosthesis are noted. Mild bilateral lower lung atelectasis/airspace disease. There is no pleural effusion or pneumothorax. Degenerative changes are seen in the spine. IMPRESSION: Mild bilateral lower lung atelectasis/airspace disease. Cardiomegaly. Aortic Atherosclerosis (ICD10-I70.0). Electronically Signed   By: Zerita Boers M.D.   On: 08/14/2020 17:25   VAS [place] UPPER EXTREMITY VENOUS DUPLEX  Result Date: 08/15/2020 UPPER VENOUS STUDY  Patient Name:  [name]   Date of Exam:   08/15/2020 Medical Rec #: [id number]            Accession #:    [id number] Date of Birth: Oct 01, 1945             Patient Gender: F Patient Age:   12Y Exam Location[facility]  [facility] Procedure:      VAS [place] UPPER EXTREMITY VENOUS DUPLEX Referring Phys: [id number] ANAND D HONGALGI --------------------------------------------------------------------------------  Indications: Edema, and Erythema Risk Factors: Sepsis, INR 2.4. Anticoagulation: Coumadin. Comparison Study: No prior study on file Performing Technologist: Sharion Dove RVS  Examination Guidelines: A complete evaluation includes B-mode imaging, spectral Doppler, color Doppler, and power Doppler as needed of all accessible portions of each vessel. Bilateral testing is considered an integral part of a complete examination. Limited examinations for reoccurring indications may be performed as noted.  Right Findings: +----------+------------+---------+-----------+----------+---------------------+ RIGHT     CompressiblePhasicitySpontaneousProperties       Summary        +----------+------------+---------+-----------+----------+---------------------+ IJV           Full       Yes       Yes                                    +----------+------------+---------+-----------+----------+---------------------+ Subclavian               Yes       Yes                                    +----------+------------+---------+-----------+----------+---------------------+ Axillary                 Yes       Yes                                    +----------+------------+---------+-----------+----------+---------------------+ Brachial      Full       Yes       Yes                                    +----------+------------+---------+-----------+----------+---------------------+  Radial        Full                                                        +----------+------------+---------+-----------+----------+---------------------+  Ulnar                                               patent with color and                                                            Doppler        +----------+------------+---------+-----------+----------+---------------------+ Cephalic      Full                                                        +----------+------------+---------+-----------+----------+---------------------+ Basilic       Full                                                        +----------+------------+---------+-----------+----------+---------------------+  Left Findings: +----------+------------+---------+-----------+----------+-------+ LEFT      CompressiblePhasicitySpontaneousPropertiesSummary +----------+------------+---------+-----------+----------+-------+ Subclavian               Yes       Yes                      +----------+------------+---------+-----------+----------+-------+  Summary:  Right: No evidence of deep vein thrombosis in the upper extremity. No evidence of superficial vein thrombosis in the upper extremity.  Left: No evidence of superficial vein thrombosis in the upper extremity.  *See table(s) above for measurements and observations.  Diagnosing physician: Servando Snare MD Electronically signed by Servando Snare MD on 08/15/2020 at 2:58:56 PM.    Final    ECHOCARDIOGRAM LIMITED  Result Date: 08/15/2020    ECHOCARDIOGRAM LIMITED REPORT   Patient Name:   [name] Date of Exam: 08/15/2020 Medical Rec #:  [id number]           Height:       67.0 in Accession #:    [id number]          Weight:       218.0 lb Date of Birth:  06-May-1945            BSA:          2.098 m Patient Age:    44 years            BP:           121/53 mmHg Patient Gender: F                   HR:  71 bpm. Exam Location:  Inpatient Procedure: Limited Echo, Cardiac Doppler and Limited Color Doppler Indications:    I34.0 Nonrheumatic mitral (valve) insufficiency; S/P Aortic                 Valve  Replacement Z95.2  History:        Patient has prior history of Echocardiogram examinations, most                 recent 07/12/2020. Aortic Valve Disease, Arrythmias:Atrial                 Fibrillation; Risk Factors:Hypertension, Dyslipidemia, Sleep                 Apnea and GERD.                 Aortic Valve: 21 mm St. Jude mechanical valve is present in the                 aortic position. Procedure Date: 2006.  Sonographer:    Jonelle Sidle Dance Referring Phys: [place]  1. Poor acoustic windows limit study.  2. Septal flattening consistent with RV volume overload. . Left ventricular ejection fraction, by estimation, is 55%%. The left ventricle has normal function. The left ventricular internal cavity size was moderately to severely dilated. There is mild left ventricular hypertrophy.  3. Right ventricular systolic function is moderately reduced. The right ventricular size is mildly enlarged. There is moderately elevated pulmonary artery systolic pressure.  4. Left atrial size was moderately dilated.  5. Right atrial size was severely dilated.  6. The mitral valve is midly thickened but does not appear restricted in motion. MR jet is central. Suspect MR due to annular dilitation. . Moderate to severe mitral valve regurgitation.  7. 21 mm St Jude Reagent mechanical valve present (procedure date 2006) Not well visualized. Peak and mean gradients through the valve are 35 and 20 mm Hg respectively. Dimensionless index is 0.3. Compared to report from October 2021, gradients are increased but dimensionless index is relaively unchanged. .  8. Aortic dilatation noted. There is mild dilatation of the aortic root, measuring 39 mm.  9. The inferior vena cava is dilated in size with <50% respiratory variability, suggesting right atrial pressure of 15 mmHg. FINDINGS  Left Ventricle: Septal flattening consistent with RV volume overload. Left ventricular ejection fraction, by estimation, is 55%%. The left  ventricle has normal function. The left ventricular internal cavity size was moderately to severely dilated. There  is mild left ventricular hypertrophy. Right Ventricle: The right ventricular size is mildly enlarged. Right ventricular systolic function is moderately reduced. There is moderately elevated pulmonary artery systolic pressure. The tricuspid regurgitant velocity is 3.58 m/s, and with an assumed right atrial pressure of 8 mmHg, the estimated right ventricular systolic pressure is [id number] mmHg. Left Atrium: Left atrial size was moderately dilated. Right Atrium: Right atrial size was severely dilated. Pericardium: There is no evidence of pericardial effusion. Mitral Valve: The mitral valve is midly thickened but does not appear restricted in motion. MR jet is central. Suspect MR due to annular dilitation. There is mild thickening of the mitral valve leaflet(s). Mild mitral annular calcification. Moderate to severe mitral valve regurgitation. Aortic Valve: 21 mm St Jude Reagent mechanical valve present (procedure date 2006) Not well visualized. Peak and mean gradients through the valve are 35 and 20 mm Hg respectively. Dimensionless index is 0.3. Compared to report from October 2021, gradients are increased  but dimensionless index is relatively unchanged. The aortic valve has been repaired/replaced. Aortic valve mean gradient measures 19.8 mmHg. Aortic valve peak gradient measures 33.0 mmHg. Aortic valve area, by VTI measures 0.86 cm. There is a 21 mm St. Jude mechanical valve present in the aortic position. Procedure Date: 2006. Pulmonic Valve: The pulmonic valve was normal in structure. Pulmonic valve regurgitation is trivial. Aorta: Aortic dilatation noted. There is mild dilatation of the aortic root, measuring 39 mm. Venous: The inferior vena cava is dilated in size with less than 50% respiratory variability, suggesting right atrial pressure of 15 mmHg. LEFT VENTRICLE PLAX 2D LVIDd:         5.90 cm LVIDs:          3.40 cm LV PW:         1.10 cm LV IVS:        1.20 cm LVOT diam:     1.90 cm LV SV:         56 LV SV Index:   27 LVOT Area:     2.84 cm  IVC IVC diam: 2.80 cm LEFT ATRIUM         Index LA diam:    5.20 cm 2.48 cm/m  AORTIC VALVE AV Area (Vmax):    0.81 cm AV Area (Vmean):   0.80 cm AV Area (VTI):     0.86 cm AV Vmax:           287.25 cm/s AV Vmean:          212.500 cm/s AV VTI:            0.650 m AV Peak Grad:      33.0 mmHg AV Mean Grad:      19.8 mmHg LVOT Vmax:         81.60 cm/s LVOT Vmean:        60.050 cm/s LVOT VTI:          0.198 m LVOT/AV VTI ratio: 0.30  AORTA Ao Root diam: 3.90 cm Ao Asc diam:  4.10 cm MITRAL VALVE                 TRICUSPID VALVE MV Area (PHT): 3.03 cm      TR Peak grad:   51.3 mmHg MV Decel Time: 250 msec      TR Vmax:        358.00 cm/s MR Peak grad:    124.1 mmHg MR Mean grad:    80.5 mmHg   SHUNTS MR Vmax:         557.00 cm/s Systemic VTI:  0.20 m MR Vmean:        423.0 cm/s  Systemic Diam: 1.90 cm MR PISA:         2.26 cm MR PISA Eff ROA: 16 mm MR PISA Radius:  0.60 cm MV E velocity: 148.00 cm/s MV A velocity: 80.20 cm/s MV E/A ratio:  1.85 Dorris Carnes MD Electronically signed by Dorris Carnes MD Signature Date/Time: 08/15/2020/6:42:18 PM    Final     Cardiac Studies   Transesophageal Echocardiogram: Date: 07/12/20 Results: 1. Left ventricular ejection fraction, by estimation, is 50 to 55%. The  left ventricle has low normal function. The left ventricle demonstrates  global hypokinesis. Left ventricular diastolic function could not be  evaluated.   2. Left atrial size was severely dilated. No left atrial/left atrial  appendage thrombus was detected. The LAA emptying velocity was 50 cm/s.  (the rhythm was atrial flutter).  3. Right atrial size was severely dilated.   4. The mitral valve is rheumatic. Moderate to severe mitral valve  regurgitation. No evidence of mitral stenosis. The mean mitral valve  gradient is 3.0 mmHg.   5. Tricuspid valve regurgitation  is moderate.   6. The aortic valve has been repaired/replaced. Aortic valve  regurgitation is trivial. No aortic stenosis is present. There is a 19 mm  mechanical valve present in the aortic position. Echo findings are  consistent with normal structure and function of  the aortic valve prosthesis. Aortic valve mean gradient measures 3.0 mmHg.  Aortic valve Vmax measures 1.09 m/s.   7. Aortic dilatation noted. There is mild dilatation of the ascending  aorta, measuring 40 mm. There is Moderate (Grade III) atheroma plaque  involving the descending aorta.   8. Evidence of atrial level shunting detected by color flow Doppler.  There is a small patent foramen ovale with predominantly left to right  shunting across the atrial septum.   9. Right ventricular systolic function is normal. The right ventricular  size is normal. There is mildly elevated pulmonary artery systolic  pressure.   Patient Profile     75 y.o. 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  Assessment & Plan    Sepsis secondary to cellulitis S/p AVR with prior subdural hematoma and permanent AF:  On coumadin with INR goal 2-2.5 (08/16/20 at 3) Severe Mitral Regurgitation Moderate Thoracic Aortic Dilation - ordering BMP and Mg for PM - goal is to restart Entresto (improved patient symptoms and patient has increase intravascular volume on 5/9/ echo) but may not be able to with BP and may need lower dose at DC - continue warfarin; no ASA - no evidence of valvular vegetation - continue pravastatin - continue coreg 3.125    For questions or updates, please contact [facility] Please consult [email or web address] for contact info under Cardiology/STEMI.      Signed, Werner Lean, MD  08/16/2020, 7:45 AM

## 2020-08-16 NOTE — Progress Notes (Deleted)
Patient refusing CPAP.

## 2020-08-16 NOTE — Progress Notes (Addendum)
PROGRESS NOTE   [name]  MMH:[id number]    DOB: 1946-01-10    DOA: 08/14/2020  PCP: Maurice Small, MD   I have briefly reviewed patients previous medical records in [facility].  Chief Complaint  Patient presents with  . Shortness of Breath    Brief Narrative:  75 y.o. female with medical history significant for  permanent A. 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  In the ED, febrile to 103.3 F, code sepsis initiated and admitted for sepsis secondary to right forearm cellulitis.  Improving.  Blood cultures x2, negative to date, ID consulted due to sepsis picture, mechanical valve.  Cardiology on board.   Assessment & Plan:  Active Problems:   Sepsis ([place])   Sepsis secondary to right forearm cellulitis. Presented with sudden onset of shaking chills, right forearm edema, erythema and tenderness with palpation, generalized weakness, and dyspnea with exertion Febrile with T-max 103.3, WBC 16,000, respiratory rate 24  Code sepsis called in the ED, started on IV antibiotics Zosyn and IV vancomycin Received 3 L bolus of lactated ringer and placed on maintenance IVF. Need to rule out bacteremia, especially given mechanical AVR. Right upper extremity venous Doppler: No DVT or SVT. Patient has defervesced.  Leukocytosis resolved.  Lactate normal.  Procalcitonin 0.3.  COVID-19 and flu panel PCR negative.  Urine microscopy not indicative of UTI.  MRSA PCR negative. Discontinue  IVF.  Continue empirically started IV Zosyn and vancomycin pending culture results. Blood cultures x2: Negative to date after 2 days.  Urine culture: Insignificant growth. Defervesced and no fever since admission 5/8.  No leukocytosis.  Still has some mild signs and symptoms of cellulitis on right forearm. Given presence of mechanical valve and sepsis picture on presentation with high fevers, consulted ID 5/10, input pending.  Elevated troponin, suspect demand ischemia in the setting of sepsis HS Troponin mildly elevated with flat trend: 36 > 42 > 36 > 34 No evidence of acute ischemia on twelve-lead EKG. No anginal symptoms. Limited echo 5/9: Poor acoustical window.  Septal flattening consistent with RV volume load.  LVEF 55%.  Mild LVH.  Moderate to severe MR.  21 mm St Jude Regent mechanical valve present.  Mild aortic root dilatation 39 mm. No further evaluation needed.  Dyspnea Possibly due to sepsis picture on admission.  Clinically not in decompensated CHF.  Resolved.  AKI Resolved.  Generalized weakness  likely secondary to sepsis PT OT recommend no follow-up. Fall precautions  Aortic mechanical valve/small PFO With predominantly left-to-right shunting across the atrial septum. Continue home Coumadin for CVA prevention History of subdural hematoma in the setting of INR greater than 2.2 shortly after AVR, now cleared by neurosurgery to maintain INR between 2-2.5.  INR today 3-management per pharmacy.  No aspirin. Cardiology consultation appreciated.  Severe mitral regurgitation Cardiology consultation appreciated.  She has had TEE and structural heart team has reviewed and valve is clip above.  Limited echo as noted above.  Defer management to cardiology.  No plans for surgery.  Permanent atrial fibrillation  History of ablation x2, failed.  Currently in SR versus paroxysmal A. fib with controlled ventricular rate.  Anticoagulated on Coumadin.  Chronic  anxiety Stable Resume home regimen.  Chronic systolic and diastolic CHF Euvolemic on exam Limited TEE results as above. Resume home regimen. Holding off home Entresto due to soft blood pressures. Cardiology consultation and follow-up appreciated. Continue Lasix 40 mg twice daily and low-dose carvedilol 3.125 mg twice daily.  Hypothyroidism TSH: 4.836, minimally elevated, likely related to acute illness.  Free T4 upper limit of normal.  Recommend repeating these in 4 to 6 weeks. Resume home levothyroxine  Essential hypertension Controlled/soft blood pressures.  Only on carvedilol at this time.  Hyperlipidemia Continue home pravastatin.  OSA CPAP qhs.  Patient unable to tolerate hospital CPAP machine and RN have requested for family to bring home machine.  Ascending aortic dilatation Outpatient follow-up.  Anemia Hemoglobin is dropped from 12 range to 10.2.  Suspect dilutional from aggressive IV fluid resuscitation.  Stable  Hypokalemia Replaced  Body mass index is 33.94 kg/m./Obesity   DVT prophylaxis:   Anticoagulated on Coumadin   Code Status: Full Code Family Communication: I was unable to reach patient's spouse via phone, patient wanted me to speak to her sister.  I called and updated patient's sister with her care. Disposition:  Status is: Inpatient  Remains inpatient appropriate because:Inpatient level of care appropriate due to severity of illness   Dispo:  Patient From: Home  Planned Disposition: Home  Medically stable for discharge: No       Consultants:   Cardiology Infectious disease  Procedures:   None  Antimicrobials:    Anti-infectives (From admission, onward)   Start     Dose/Rate Route Frequency Ordered Stop   08/15/20 2100  vancomycin (VANCOREADY) IVPB 1000 mg/200 mL        1,000 mg 200 mL/hr over 60 Minutes Intravenous Every 24 hours 08/14/20 1916     08/15/20 0200  piperacillin-tazobactam (ZOSYN) IVPB 3.375 g        3.375  g 12.5 mL/hr over 240 Minutes Intravenous Every 8 hours 08/14/20 1916     08/14/20 1800  piperacillin-tazobactam (ZOSYN) IVPB 3.375 g        3.375 g 100 mL/hr over 30 Minutes Intravenous  Once 08/14/20 1754 08/14/20 1903   08/14/20 1800  vancomycin (VANCOREADY) IVPB 1500 mg/300 mL        1,500 mg 150 mL/hr over 120 Minutes Intravenous  Once 08/14/20 1754 08/14/20 2149        Subjective:  No significant difference in right upper extremity symptoms.  Ongoing some swelling with pain/tenderness but no redness.  Weakness is better.  Intermittent DOE, not new for patient.  No other complaints reported.  Objective:   Vitals:   08/16/20 0910 08/16/20 1150 08/16/20 1214 08/16/20 1223  BP: 133/63   (!) 95/50  Pulse: 62   62  Resp:    15  Temp:   98.1 F (36.7 C)   TempSrc:   Oral   SpO2:  98%  99%  Weight:      Height:        General exam: Pleasant elderly female, moderately built and obese sitting up comfortably in bed without distress. Respiratory system: Clear to auscultation.  No increased work of breathing. Cardiovascular system: S1 and S2 heard, RRR.  No JVD, murmurs or pedal edema.  Click of mechanical AVR heard.  Pansystolic murmur best heard at apex 3 x 6.  Telemetry personally reviewed: Sinus  rhythm, BBB morphology.  Occasional 3-4 beat NSVT. Gastrointestinal system: Abdomen is nondistended, soft and nontender. No organomegaly or masses felt. Normal bowel sounds heard. Central nervous system: Alert and oriented. No focal neurological deficits. Extremities: Symmetric 5 x 5 power.  Right forearm with mild swelling, slightly less compared to yesterday, mildly increased warmth, mild tenderness and faint fading patchy erythema now only on the dorsal aspect of left proximal forearm.  No crepitus.  Good peripheral pulses felt. Skin: No rashes, lesions or ulcers Psychiatry: Judgement and insight appear normal. Mood & affect appropriate.     Data Reviewed:   I have personally  reviewed following labs and imaging studies   CBC: Recent Labs  Lab 08/14/20 1730 08/14/20 1736 08/15/20 0553 08/16/20 0045  WBC 16.0*  --  10.2 9.2  NEUTROABS 13.8*  --   --   --   HGB 12.4 12.2 10.2* 10.6*  HCT 37.2 36.0 30.8* 32.8*  MCV 96.4  --  97.8 97.9  PLT 236  --  190 [id number]    Basic Metabolic Panel: Recent Labs  Lab 08/14/20 1730 08/14/20 1736 08/15/20 0830 08/16/20 0045  NA 138 140 138 140  K 4.2 4.1 3.2* 3.6  CL 106  --  108 108  CO2 23  --  24 25  GLUCOSE 121*  --  152* 100*  BUN 28*  --  20 13  CREATININE 1.02*  --  0.90 0.81  CALCIUM 9.4  --  8.3* 8.3*  MG  --   --  2.1  --   PHOS  --   --  2.6  --     Liver Function Tests: Recent Labs  Lab 08/14/20 1730  AST 26  ALT 19  ALKPHOS 63  BILITOT 0.9  PROT 7.4  ALBUMIN 3.8    CBG: Recent Labs  Lab 08/15/20 1553  GLUCAP 98    Microbiology Studies:   Recent Results (from the past 240 hour(s))  MRSA PCR Screening     Status: None   Collection Time: 08/14/20 12:31 AM   Specimen: Nasopharyngeal  Result Value Ref Range Status   MRSA by PCR NEGATIVE NEGATIVE Final    Comment:        The GeneXpert MRSA Assay (FDA approved for NASAL specimens only), is one component of a comprehensive MRSA colonization surveillance program. It is not intended to diagnose MRSA infection nor to guide or monitor treatment for MRSA infections. Performed at [facility], [place] [address], [place], [place] [address]   Blood culture (routine x 2)     Status: None (Preliminary result)   Collection Time: 08/14/20  5:50 PM   Specimen: BLOOD LEFT FOREARM  Result Value Ref Range Status   Specimen Description BLOOD LEFT FOREARM  Final   Special Requests   Final    BOTTLES DRAWN AEROBIC AND ANAEROBIC Blood Culture adequate volume   Culture   Final    NO GROWTH 2 DAYS Performed at [facility], [place] [address], [place],  [address]    Report Status PENDING  Incomplete  Blood culture (routine x 2)      Status: None (Preliminary result)   Collection Time: 08/14/20  6:16 PM   Specimen: BLOOD  Result Value Ref Range Status   Specimen Description BLOOD RIGHT ANTECUBITAL  Final   Special Requests   Final    BOTTLES DRAWN AEROBIC AND ANAEROBIC Blood Culture adequate volume   Culture   Final    NO  GROWTH 2 DAYS Performed at [facility], [place] [address], [place], [place] [address]    Report Status PENDING  Incomplete  Resp Panel by RT-PCR (Flu A&B, Covid) Nasopharyngeal Swab     Status: None   Collection Time: 08/14/20  6:20 PM   Specimen: Nasopharyngeal Swab; Nasopharyngeal(NP) swabs in vial transport medium  Result Value Ref Range Status   SARS Coronavirus 2 by RT PCR NEGATIVE NEGATIVE Final    Comment: (NOTE) SARS-CoV-2 target nucleic acids are NOT DETECTED.  The SARS-CoV-2 RNA is generally detectable in upper respiratory specimens during the acute phase of infection. The lowest concentration of SARS-CoV-2 viral copies this assay can detect is 138 copies/mL. A negative result does not preclude SARS-Cov-2 infection and should not be used as the sole basis for treatment or other patient management decisions. A negative result may occur with  improper specimen collection/handling, submission of specimen other than nasopharyngeal swab, presence of viral mutation(s) within the areas targeted by this assay, and inadequate number of viral copies(<138 copies/mL). A negative result must be combined with clinical observations, patient history, and epidemiological information. The expected result is Negative.  Fact Sheet for Patients:  EntrepreneurPulse.com.au  Fact Sheet for Healthcare Providers:  IncredibleEmployment.be  This test is no t yet approved or cleared by the [place] FDA and  has been authorized for detection and/or diagnosis of SARS-CoV-2 by FDA under an Emergency Use Authorization (EUA). This EUA will remain  in effect (meaning  this test can be used) for the duration of the COVID-19 declaration under Section 564(b)(1) of the Act, 21 U.S.C.section 360bbb-3(b)(1), unless the authorization is terminated  or revoked sooner.       Influenza A by PCR NEGATIVE NEGATIVE Final   Influenza B by PCR NEGATIVE NEGATIVE Final    Comment: (NOTE) The Xpert Xpress SARS-CoV-2/FLU/RSV plus assay is intended as an aid in the diagnosis of influenza from Nasopharyngeal swab specimens and should not be used as a sole basis for treatment. Nasal washings and aspirates are unacceptable for Xpert Xpress SARS-CoV-2/FLU/RSV testing.  Fact Sheet for Patients: EntrepreneurPulse.com.au  Fact Sheet for Healthcare Providers: IncredibleEmployment.be  This test is not yet approved or cleared by the [place] FDA and has been authorized for detection and/or diagnosis of SARS-CoV-2 by FDA under an Emergency Use Authorization (EUA). This EUA will remain in effect (meaning this test can be used) for the duration of the COVID-19 declaration under Section 564(b)(1) of the Act, 21 U.S.C. section 360bbb-3(b)(1), unless the authorization is terminated or revoked.  Performed at [facility], [place] [address], [place], [place] [address]   Urine culture     Status: Abnormal   Collection Time: 08/14/20  9:11 PM   Specimen: Urine, Clean Catch  Result Value Ref Range Status   Specimen Description URINE, CLEAN CATCH  Final   Special Requests NONE  Final   Culture (A)  Final    <10,000 COLONIES/mL INSIGNIFICANT GROWTH Performed at [facility], [place] [address], [place], [place] [address]    Report Status 08/16/2020 FINAL  Final     Radiology Studies:  DG Chest 2 View  Result Date: 08/14/2020 CLINICAL DATA:  Shortness of breath. EXAM: CHEST - 2 VIEW COMPARISON:  Chest radiograph dated 09/24/2019. FINDINGS: The heart is enlarged. Vascular calcifications are seen in the aortic arch. Median  sternotomy wires and an aortic valve prosthesis are noted. Mild bilateral lower lung atelectasis/airspace disease. There is no pleural effusion or pneumothorax. Degenerative  changes are seen in the spine. IMPRESSION: Mild bilateral lower lung atelectasis/airspace disease. Cardiomegaly. Aortic Atherosclerosis (ICD10-I70.0). Electronically Signed   By: Zerita Boers M.D.   On: 08/14/2020 17:25   VAS [place] UPPER EXTREMITY VENOUS DUPLEX  Result Date: 08/15/2020 UPPER VENOUS STUDY  Patient Name:  [name]  Date of Exam:   08/15/2020 Medical Rec #: [id number]            Accession #:    [id number] Date of Birth: 1945/08/23             Patient Gender: F Patient Age:   78Y Exam Location:  [facility] Procedure:      VAS [place] UPPER EXTREMITY VENOUS DUPLEX Referring Phys: [id number] Zyra Parrillo D Nisaiah Bechtol --------------------------------------------------------------------------------  Indications: Edema, and Erythema Risk Factors: Sepsis, INR 2.4. Anticoagulation: Coumadin. Comparison Study: No prior study on file Performing Technologist: Sharion Dove RVS  Examination Guidelines: A complete evaluation includes B-mode imaging, spectral Doppler, color Doppler, and power Doppler as needed of all accessible portions of each vessel. Bilateral testing is considered an integral part of a complete examination. Limited examinations for reoccurring indications may be performed as noted.  Right Findings: +----------+------------+---------+-----------+----------+---------------------+ RIGHT     CompressiblePhasicitySpontaneousProperties       Summary        +----------+------------+---------+-----------+----------+---------------------+ IJV           Full       Yes       Yes                                    +----------+------------+---------+-----------+----------+---------------------+ Subclavian               Yes       Yes                                     +----------+------------+---------+-----------+----------+---------------------+ Axillary                 Yes       Yes                                    +----------+------------+---------+-----------+----------+---------------------+ Brachial      Full       Yes       Yes                                    +----------+------------+---------+-----------+----------+---------------------+ Radial        Full                                                        +----------+------------+---------+-----------+----------+---------------------+ Ulnar                                               patent with color and  Doppler        +----------+------------+---------+-----------+----------+---------------------+ Cephalic      Full                                                        +----------+------------+---------+-----------+----------+---------------------+ Basilic       Full                                                        +----------+------------+---------+-----------+----------+---------------------+  Left Findings: +----------+------------+---------+-----------+----------+-------+ LEFT      CompressiblePhasicitySpontaneousPropertiesSummary +----------+------------+---------+-----------+----------+-------+ Subclavian               Yes       Yes                      +----------+------------+---------+-----------+----------+-------+  Summary:  Right: No evidence of deep vein thrombosis in the upper extremity. No evidence of superficial vein thrombosis in the upper extremity.  Left: No evidence of superficial vein thrombosis in the upper extremity.  *See table(s) above for measurements and observations.  Diagnosing physician: Servando Snare MD Electronically signed by Servando Snare MD on 08/15/2020 at 2:58:56 PM.    Final    ECHOCARDIOGRAM LIMITED  Result Date: 08/15/2020    ECHOCARDIOGRAM  LIMITED REPORT   Patient Name:   [name] Date of Exam: 08/15/2020 Medical Rec #:  [id number]           Height:       67.0 in Accession #:    [id number]          Weight:       218.0 lb Date of Birth:  May 16, 1945            BSA:          2.098 m Patient Age:    9 years            BP:           121/53 mmHg Patient Gender: F                   HR:           71 bpm. Exam Location:  Inpatient Procedure: Limited Echo, Cardiac Doppler and Limited Color Doppler Indications:    I34.0 Nonrheumatic mitral (valve) insufficiency; S/P Aortic                 Valve Replacement Z95.2  History:        Patient has prior history of Echocardiogram examinations, most                 recent 07/12/2020. Aortic Valve Disease, Arrythmias:Atrial                 Fibrillation; Risk Factors:Hypertension, Dyslipidemia, Sleep                 Apnea and GERD.                 Aortic Valve: 21 mm St. Jude mechanical valve is present in the                 aortic position. Procedure Date: 2006.  Sonographer:    Jonelle Sidle Dance Referring Phys: [place]  1. Poor acoustic windows limit study.  2. Septal flattening consistent with RV volume overload. . Left ventricular ejection fraction, by estimation, is 55%%. The left ventricle has normal function. The left ventricular internal cavity size was moderately to severely dilated. There is mild left ventricular hypertrophy.  3. Right ventricular systolic function is moderately reduced. The right ventricular size is mildly enlarged. There is moderately elevated pulmonary artery systolic pressure.  4. Left atrial size was moderately dilated.  5. Right atrial size was severely dilated.  6. The mitral valve is midly thickened but does not appear restricted in motion. MR jet is central. Suspect MR due to annular dilitation. . Moderate to severe mitral valve regurgitation.  7. 21 mm St Jude Reagent mechanical valve present (procedure date 2006) Not well visualized. Peak and mean gradients  through the valve are 35 and 20 mm Hg respectively. Dimensionless index is 0.3. Compared to report from October 2021, gradients are increased but dimensionless index is relaively unchanged. .  8. Aortic dilatation noted. There is mild dilatation of the aortic root, measuring 39 mm.  9. The inferior vena cava is dilated in size with <50% respiratory variability, suggesting right atrial pressure of 15 mmHg. FINDINGS  Left Ventricle: Septal flattening consistent with RV volume overload. Left ventricular ejection fraction, by estimation, is 55%%. The left ventricle has normal function. The left ventricular internal cavity size was moderately to severely dilated. There  is mild left ventricular hypertrophy. Right Ventricle: The right ventricular size is mildly enlarged. Right ventricular systolic function is moderately reduced. There is moderately elevated pulmonary artery systolic pressure. The tricuspid regurgitant velocity is 3.58 m/s, and with an assumed right atrial pressure of 8 mmHg, the estimated right ventricular systolic pressure is 78.6 mmHg. Left Atrium: Left atrial size was moderately dilated. Right Atrium: Right atrial size was severely dilated. Pericardium: There is no evidence of pericardial effusion. Mitral Valve: The mitral valve is midly thickened but does not appear restricted in motion. MR jet is central. Suspect MR due to annular dilitation. There is mild thickening of the mitral valve leaflet(s). Mild mitral annular calcification. Moderate to severe mitral valve regurgitation. Aortic Valve: 21 mm St Jude Reagent mechanical valve present (procedure date 2006) Not well visualized. Peak and mean gradients through the valve are 35 and 20 mm Hg respectively. Dimensionless index is 0.3. Compared to report from October 2021, gradients are increased but dimensionless index is relatively unchanged. The aortic valve has been repaired/replaced. Aortic valve mean gradient measures 19.8 mmHg. Aortic valve peak  gradient measures 33.0 mmHg. Aortic valve area, by VTI measures 0.86 cm. There is a 21 mm St. Jude mechanical valve present in the aortic position. Procedure Date: 2006. Pulmonic Valve: The pulmonic valve was normal in structure. Pulmonic valve regurgitation is trivial. Aorta: Aortic dilatation noted. There is mild dilatation of the aortic root, measuring 39 mm. Venous: The inferior vena cava is dilated in size with less than 50% respiratory variability, suggesting right atrial pressure of 15 mmHg. LEFT VENTRICLE PLAX 2D LVIDd:         5.90 cm LVIDs:         3.40 cm LV PW:         1.10 cm LV IVS:        1.20 cm LVOT diam:     1.90 cm LV SV:         56 LV SV Index:  27 LVOT Area:     2.84 cm  IVC IVC diam: 2.80 cm LEFT ATRIUM         Index LA diam:    5.20 cm 2.48 cm/m  AORTIC VALVE AV Area (Vmax):    0.81 cm AV Area (Vmean):   0.80 cm AV Area (VTI):     0.86 cm AV Vmax:           287.25 cm/s AV Vmean:          212.500 cm/s AV VTI:            0.650 m AV Peak Grad:      33.0 mmHg AV Mean Grad:      19.8 mmHg LVOT Vmax:         81.60 cm/s LVOT Vmean:        60.050 cm/s LVOT VTI:          0.198 m LVOT/AV VTI ratio: 0.30  AORTA Ao Root diam: 3.90 cm Ao Asc diam:  4.10 cm MITRAL VALVE                 TRICUSPID VALVE MV Area (PHT): 3.03 cm      TR Peak grad:   51.3 mmHg MV Decel Time: 250 msec      TR Vmax:        358.00 cm/s MR Peak grad:    124.1 mmHg MR Mean grad:    80.5 mmHg   SHUNTS MR Vmax:         557.00 cm/s Systemic VTI:  0.20 m MR Vmean:        423.0 cm/s  Systemic Diam: 1.90 cm MR PISA:         2.26 cm MR PISA Eff ROA: 16 mm MR PISA Radius:  0.60 cm MV E velocity: 148.00 cm/s MV A velocity: 80.20 cm/s MV E/A ratio:  1.85 Dorris Carnes MD Electronically signed by Dorris Carnes MD Signature Date/Time: 08/15/2020/6:42:18 PM    Final      Scheduled Meds:   . carvedilol  3.125 mg Oral BID WC  . cholecalciferol  4,000 Units Oral Daily  . ferrous sulfate  325 mg Oral Q breakfast  . furosemide  40 mg Oral  BID  . levothyroxine  100 mcg Oral QAC breakfast  . multivitamin with minerals  1 tablet Oral Daily  . potassium chloride SA  40 mEq Oral BID  . pravastatin  20 mg Oral Daily  . sertraline  100 mg Oral Daily  . Warfarin - Pharmacist Dosing Inpatient   Does not apply q1600    Continuous Infusions:   . piperacillin-tazobactam (ZOSYN)  IV 3.375 g (08/16/20 0553)  . vancomycin 1,000 mg (08/15/20 2240)     LOS: 2 days     Vernell Leep, MD, [place], [facility]. [facility]    To contact the attending provider between 7A-7P or the covering provider during after hours 7P-7A, please log into the web site [email or web address] and access using universal [place] password for that web site. If you do not have the password, please call the hospital operator.  08/16/2020, 12:54 PM

## 2020-08-16 NOTE — Evaluation (Signed)
Occupational Therapy Evaluation Patient Details Name: [name] MRN: [id number] DOB: 1945/12/28 Today's Date: 08/16/2020    History of Present Illness Patient is a 75 yo female admitted 5/8 with RUE pain, edema, weakness and SOB. Pt with sepsis due to RUE cellulitis. PMHx: AFib, CHF, AVR, MVR, SDH, Asthma, pulmonary HTN, HLD, Lt TKA   Clinical Impression   Pt admitted with the above diagnosis and has the deficits listed below. Pt would benefit from continued acute OT to focus on energy conservation education and safety on her feet during all adls. Pt limited by poor endurance during adls. Feel pt will be safe to d/c home with supervision of her husband.  Will continue to see acutely. No post acute services recommended.     Follow Up Recommendations  No OT follow up;Supervision - Intermittent    Equipment Recommendations  None recommended by OT    Recommendations for Other Services       Precautions / Restrictions Precautions Precautions: Fall Precaution Comments: Keep MAP >65 Restrictions Weight Bearing Restrictions: No      Mobility Bed Mobility Overal bed mobility: Modified Independent             General bed mobility comments: HOB at 30 degrees. Did not use bed rails.    Transfers Overall transfer level: Needs assistance Equipment used: None Transfers: Sit to/from Stand Sit to Stand: Supervision         General transfer comment: Supervision only for safety.    Balance Overall balance assessment: Mild deficits observed, not formally tested                                         ADL either performed or assessed with clinical judgement   ADL Overall ADL's : Needs assistance/impaired Eating/Feeding: Independent;Sitting   Grooming: Wash/dry hands;Wash/dry face;Oral care;Supervision/safety;Standing   Upper Body Bathing: Set up;Sitting   Lower Body Bathing: Min guard;Sit to/from stand   Upper Body Dressing : Set up;Sitting    Lower Body Dressing: Min guard;Sit to/from stand   Toilet Transfer: Min guard;Ambulation   Toileting- Clothing Manipulation and Hygiene: Min guard;Sit to/from stand   Tub/ Shower Transfer: Min guard;Ambulation   Functional mobility during ADLs: Min guard;Rolling walker General ADL Comments: Pt doing very well with adls but just feels weak and tired all the time.  Pt was safe on her feet and required rest breaks but little to no physical assistance.     Vision Baseline Vision/History: Wears glasses Wears Glasses: At all times Patient Visual Report: No change from baseline Vision Assessment?: No apparent visual deficits     Perception Perception Perception Tested?: No   Praxis Praxis Praxis tested?: Within functional limits    Pertinent Vitals/Pain Pain Assessment: Faces Faces Pain Scale: Hurts a little bit Pain Location: RUE Pain Descriptors / Indicators: Sore Pain Intervention(s): Monitored during session;Repositioned     Hand Dominance Right   Extremity/Trunk Assessment Upper Extremity Assessment Upper Extremity Assessment: RUE deficits/detail RUE Deficits / Details: ROM WFL.  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  Pt can close hand and open but it is tight and often has pain. RUE Sensation:  (reports numbness and tingling over last months.) RUE Coordination: decreased fine motor   Lower Extremity Assessment Lower Extremity Assessment: Defer to PT evaluation   Cervical / Trunk Assessment Cervical / Trunk Assessment: Normal   Communication Communication Communication: No difficulties  Cognition Arousal/Alertness: Awake/alert Behavior During Therapy: WFL for tasks assessed/performed Overall Cognitive Status: Within Functional Limits for tasks assessed                                     General Comments  Pt most limited by fatigue and some mild shortness of breath.    Exercises     Shoulder Instructions      Home Living Family/patient  expects to be discharged to:: Private residence Living Arrangements: Spouse/significant other Available Help at Discharge: Family;Available PRN/intermittently Type of Home: House Home Access: Level entry     Home Layout: One level     Bathroom Shower/Tub: Occupational psychologist: Handicapped height Bathroom Accessibility: Yes   Home Equipment: Environmental consultant - 2 wheels;Bedside commode;Shower seat - built in;Cane - single point   Additional Comments: Pt uses walker but only occasionally.      Prior Functioning/Environment Level of Independence: Independent with assistive device(s)        Comments: Uses SPC as needed, does own ADLs.IADLs. Drives. No falls.        OT Problem List: Decreased activity tolerance;Impaired balance (sitting and/or standing);Cardiopulmonary status limiting activity;Pain      OT Treatment/Interventions: Self-care/ADL training;Energy conservation;Therapeutic activities    OT Goals(Current goals can be found in the care plan section) Acute Rehab OT Goals Patient Stated Goal: to feel better and go home OT Goal Formulation: With patient Time For Goal Achievement: 08/30/20 Potential to Achieve Goals: Good ADL Goals Additional ADL Goal #1: Pt will gather all clothes with cane and dress self without further assist. Additional ADL Goal #2: Pt will walk to bathroom with cane if needed and complete toileting with mod I. Additional ADL Goal #3: Pt will state 3 things she can change at home to conserve energy during moring adl routine or home tasks without cues.  OT Frequency: Min 2X/week   Barriers to D/C:    husband around some of the time.       Co-evaluation              AM-PAC OT "6 Clicks" Daily Activity     Outcome Measure Help from another person eating meals?: None Help from another person taking care of personal grooming?: None Help from another person toileting, which includes using toliet, bedpan, or urinal?: A Little Help from  another person bathing (including washing, rinsing, drying)?: A Little Help from another person to put on and taking off regular upper body clothing?: None Help from another person to put on and taking off regular lower body clothing?: A Little 6 Click Score: 21   End of Session Nurse Communication: Mobility status  Activity Tolerance: Patient limited by fatigue Patient left: in chair;with call bell/phone within reach  OT Visit Diagnosis: Unsteadiness on feet (R26.81);Pain Pain - Right/Left: Right Pain - part of body: Arm                Time: 2637-8588 OT Time Calculation (min): 25 min Charges:  OT General Charges $OT Visit: 1 Visit OT Evaluation $OT Eval Moderate Complexity: 1 Mod  Glenford Peers 08/16/2020, 9:26 AM

## 2020-08-16 NOTE — Consult Note (Signed)
[facility]  Total days of antibiotics 3       Reason for Consult: cellulitis And fever   Referring Physician: [email or web address]  Active Problems:   Sepsis ([place])    HPI: [name] is a 75 y.o. female with hx of AS s/p AV with st jude mechanical valve in 2006 by dr Lucianne Lei trigt, hx of knee replacement, admit with acute onset of fever,rigors x 24h followed by right forearm redness and swelling. She was advised by her sister, retired Marine scientist, to call 911. She was admitted with fever of 103F, WBC of 10K, but no aki. Her lactic acid WNL. On exam had tender swelling of right forearm concerning for cellulitis. She was empirically started on vancomycin and piptazo. TTE unrevealing. Since start of abtx she feels improved swelling improving on right arm.  ROS: having frequent watery diarrhea after she eats, happening prior to admit  Past Medical History:  Diagnosis Date  . Anxiety   . Aortic stenosis    status post aortic valve replacement with St. Jude mechanical prosthesis  . 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   61mm by echo 06/2016 and 72mm by CT angin 2016  . Chronic anticoagulation   . 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 Depression   . DJD (degenerative joint disease) of knee   . Dyslipidemia   . GERD (gastroesophageal reflux disease)    "several years ago; none since" (11/12/2012)  . Hyperlipidemia   . Hyperlipidemia LDL goal <70 01/19/2017  . Hypertension   . Left atrial enlargement   . Mitral regurgitation 06/25/2016   Mild moderate MR by echo 06/2016  . Obesity   . Persistent atrial fibrillation (HCC)    s/p afib ablation x 2 at [facility]  . Sleep apnea    On CPAP at 12cm H2O  . 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 - now cleared by neurosurgery to maintain INR 2-2.5    Allergies:  Allergies  Allergen Reactions  . Codeine Anaphylaxis    Jerking, involuntary jerking    . Metoprolol     depression   . Propoxyphene Other (See Comments)    Bad vivid dreams  . Dilantin [Phenytoin Sodium Extended] Rash and Itching     MEDICATIONS: . carvedilol  3.125 mg Oral BID WC  . cholecalciferol  4,000 Units Oral Daily  . ferrous sulfate  325 mg Oral Q breakfast  . furosemide  40 mg Oral BID  . levothyroxine  100 mcg Oral QAC breakfast  . multivitamin with minerals  1 tablet Oral Daily  . potassium chloride SA  40 mEq Oral BID  . pravastatin  20 mg Oral Daily  . sertraline  100 mg Oral Daily  . warfarin  2 mg Oral ONCE-1600  . Warfarin - Pharmacist Dosing Inpatient   Does not apply q1600    Social History   Tobacco Use  . Smoking status: Never Smoker  . Smokeless tobacco: Never Used  Substance Use Topics  . Alcohol use: No    Comment: 11/12/2012 "no alcohol in the last 9 years"  . Drug use: No    Family History  Problem Relation Age of Onset  . Lung cancer Mother   . Hypertension Mother   . Hyperlipidemia Father   . Hypertension Father   . Heart disease Brother     Review of Systems -  Constitutional: positive for fever and rigors.  chills, diaphoresis, activity change, appetite change, fatigue and unexpected weight change.  HENT: Negative for congestion, sore throat, rhinorrhea, sneezing, trouble swallowing and sinus pressure.  Eyes: Negative for photophobia and visual disturbance.  Respiratory: Negative for cough, chest tightness, shortness of breath, wheezing and stridor.  Cardiovascular: Negative for chest pain, palpitations and leg swelling.  Gastrointestinal: Negative for nausea, vomiting, abdominal pain, diarrhea, constipation, blood in stool, abdominal distention and anal bleeding.  Genitourinary: Negative for dysuria, hematuria, flank pain and difficulty urinating.  Musculoskeletal: Negative for myalgias, back pain, joint swelling, arthralgias and gait problem.  Skin: +redness of right arm. Negative for color change, pallor, rash and  wound.  Neurological: Negative for dizziness, tremors, weakness and light-headedness.  Hematological: Negative for adenopathy. Does not bruise/bleed easily.  Psychiatric/Behavioral: Negative for behavioral problems, confusion, sleep disturbance, dysphoric mood, decreased concentration and agitation.     OBJECTIVE: Temp:  [98.1 F (36.7 C)-98.9 F (37.2 C)] 98.9 F (37.2 C) (05/10 1544) Pulse Rate:  [61-76] 61 (05/10 1544) Resp:  [15-20] 19 (05/10 1544) BP: (95-135)/(46-87) 118/59 (05/10 1544) SpO2:  [95 %-99 %] 96 % (05/10 1544) Weight:  [98.3 kg] 98.3 kg (05/10 0408) Physical Exam  Constitutional:  oriented to person, place, and time. appears well-developed and well-nourished. No distress.  HENT: [place]/AT, PERRLA, no scleral icterus Mouth/Throat: Oropharynx is clear and moist. No oropharyngeal exudate.  Cardiovascular: Normal rate, regular rhythm and normal heart sounds. Mechanical heart sound ? Systolic murmur Pulmonary/Chest: Effort normal and breath sounds normal. No respiratory distress.  has no wheezes.  Neck = supple, no nuchal rigidity [id number] forearm slight blanching towards elbow. Trace swelling. No tenderness. Incisional scar (no implant on right elbow or arm)( Abdominal: Soft. Bowel sounds are normal.  exhibits no distension. There is no tenderness.  Lymphadenopathy: no cervical adenopathy. No axillary adenopathy Neurological: alert and oriented to person, place, and time.  Skin: Skin is warm and dry. No rash noted. No erythema.  Psychiatric: a normal mood and affect.  behavior is normal.    LABS: Results for orders placed or performed during the hospital encounter of 08/14/20 (from the past 48 hour(s))  Basic metabolic panel     Status: Abnormal   Collection Time: 08/14/20  5:30 PM  Result Value Ref Range   Sodium 138 135 - 145 mmol/L   Potassium 4.2 3.5 - 5.1 mmol/L   Chloride 106 98 - 111 mmol/L   CO2 23 22 - 32 mmol/L   Glucose, Bld 121 (H) 70 - 99 mg/dL     Comment: Glucose reference range applies only to samples taken after fasting for at least 8 hours.   BUN 28 (H) 8 - 23 mg/dL   Creatinine, Ser 1.02 (H) 0.44 - 1.00 mg/dL   Calcium 9.4 8.9 - 10.3 mg/dL   GFR, Estimated 57 (L) >60 mL/min    Comment: (NOTE) Calculated using the CKD-EPI Creatinine Equation (2021)    Anion gap 9 5 - 15    Comment: Performed at [facility] [address], [place], [place] [address]  Troponin I (High Sensitivity)     Status: Abnormal   Collection Time: 08/14/20  5:30 PM  Result Value Ref Range   Troponin I (High Sensitivity) 36 (H) <18 ng/L    Comment: (NOTE) Elevated high sensitivity troponin I (hsTnI) values and significant  changes across serial measurements may suggest ACS but many other  chronic and acute conditions are known to elevate hsTnI results.  Refer to the "Links" section for chest pain algorithms and additional  guidance. Performed at [facility], [place] [address] [place], [place] [address]   CBC with Differential     Status: Abnormal   Collection Time: 08/14/20  5:30 PM  Result Value Ref Range   WBC 16.0 (H) 4.0 - 10.5 K/uL   RBC 3.86 (L) 3.87 - 5.11 MIL/uL   Hemoglobin 12.4 12.0 - 15.0 g/dL   HCT 37.2 36.0 - 46.0 %   MCV 96.4 80.0 - 100.0 fL   MCH 32.1 26.0 - 34.0 pg   MCHC 33.3 30.0 - 36.0 g/dL   RDW 12.5 11.5 - 15.5 %   Platelets 236 150 - 400 K/uL   nRBC 0.0 0.0 - 0.2 %   Neutrophils Relative % 88 %   Neutro Abs 13.8 (H) 1.7 - 7.7 K/uL   Lymphocytes Relative 4 %   Lymphs Abs 0.7 0.7 - 4.0 K/uL   Monocytes Relative 8 %   Monocytes Absolute 1.3 (H) 0.1 - 1.0 K/uL   Eosinophils Relative 0 %   Eosinophils Absolute 0.0 0.0 - 0.5 K/uL   Basophils Relative 0 %   Basophils Absolute 0.1 0.0 - 0.1 K/uL   Immature Granulocytes 0 %   Abs Immature Granulocytes 0.06 0.00 - 0.07 K/uL    Comment: Performed at [facility] [address] [place], [place] [address]  Protime-INR     Status: Abnormal   Collection Time:  08/14/20  5:30 PM  Result Value Ref Range   Prothrombin Time 25.8 (H) 11.4 - 15.2 seconds   INR 2.4 (H) 0.8 - 1.2    Comment: (NOTE) INR goal varies based on device and disease states. Performed at [facility], [place] [address], [place], [place] [address]   Lactic acid, plasma     Status: None   Collection Time: 08/14/20  5:30 PM  Result Value Ref Range   Lactic Acid, Venous 1.2 0.5 - 1.9 mmol/L    Comment: Performed at [facility] [address] [place], [place] [address]  APTT     Status: Abnormal   Collection Time: 08/14/20  5:30 PM  Result Value Ref Range   aPTT 47 (H) 24 - 36 seconds    Comment:        IF BASELINE aPTT IS ELEVATED, SUGGEST PATIENT RISK ASSESSMENT BE USED TO DETERMINE APPROPRIATE ANTICOAGULANT THERAPY. Performed at [facility], [place] [address] [place], [place] [address]   Hepatic function panel     Status: None   Collection Time: 08/14/20  5:30 PM  Result Value Ref Range   Total Protein 7.4 6.5 - 8.1 g/dL   Albumin 3.8 3.5 - 5.0 g/dL   AST 26 15 - 41 U/L   ALT 19 0 - 44 U/L   Alkaline Phosphatase 63 38 - 126 U/L   Total Bilirubin 0.9 0.3 - 1.2 mg/dL   Bilirubin, Direct 0.1 0.0 - 0.2 mg/dL   Indirect Bilirubin 0.8 0.3 - 0.9 mg/dL    Comment: Performed at [facility] [address] [place], [place] [address]  I-Stat venous blood gas, [facility] ED)     Status: Abnormal   Collection Time: 08/14/20  5:36 PM  Result Value Ref Range   pH, Ven 7.454 (H) 7.250 - 7.430   pCO2, Ven 34.3 (L) 44.0 - 60.0 mmHg   pO2, Ven 44.0 32.0 - 45.0 mmHg   Bicarbonate 24.0 20.0 -  28.0 mmol/L   TCO2 25 22 - 32 mmol/L   O2 Saturation 82.0 %   Acid-Base Excess 0.0 0.0 - 2.0 mmol/L   Sodium 140 135 - 145 mmol/L   Potassium 4.1 3.5 - 5.1 mmol/L   Calcium, Ion 1.13 (L) 1.15 - 1.40 mmol/L   HCT 36.0 36.0 - 46.0 %   Hemoglobin 12.2 12.0 - 15.0 g/dL   Sample type VENOUS   Blood culture (routine x 2)     Status: None (Preliminary result)   Collection  Time: 08/14/20  5:50 PM   Specimen: BLOOD LEFT FOREARM  Result Value Ref Range   Specimen Description BLOOD LEFT FOREARM    Special Requests      BOTTLES DRAWN AEROBIC AND ANAEROBIC Blood Culture adequate volume   Culture      NO GROWTH 2 DAYS Performed at [facility], [place] [address], [place], [place] [address]    Report Status PENDING   Troponin I (High Sensitivity)     Status: Abnormal   Collection Time: 08/14/20  6:14 PM  Result Value Ref Range   Troponin I (High Sensitivity) 42 (H) <18 ng/L    Comment: (NOTE) Elevated high sensitivity troponin I (hsTnI) values and significant  changes across serial measurements may suggest ACS but many other  chronic and acute conditions are known to elevate hsTnI results.  Refer to the "Links" section for chest pain algorithms and additional  guidance. Performed at [facility], [place] [address], [place], [place] [address]   Blood culture (routine x 2)     Status: None (Preliminary result)   Collection Time: 08/14/20  6:16 PM   Specimen: BLOOD  Result Value Ref Range   Specimen Description BLOOD RIGHT ANTECUBITAL    Special Requests      BOTTLES DRAWN AEROBIC AND ANAEROBIC Blood Culture adequate volume   Culture      NO GROWTH 2 DAYS Performed at [facility], [place] [address], [place], [place] [address]    Report Status PENDING   Resp Panel by RT-PCR (Flu A&B, Covid) Nasopharyngeal Swab     Status: None   Collection Time: 08/14/20  6:20 PM   Specimen: Nasopharyngeal Swab; Nasopharyngeal(NP) swabs in vial transport medium  Result Value Ref Range   SARS Coronavirus 2 by RT PCR NEGATIVE NEGATIVE    Comment: (NOTE) SARS-CoV-2 target nucleic acids are NOT DETECTED.  The SARS-CoV-2 RNA is generally detectable in upper respiratory specimens during the acute phase of infection. The lowest concentration of SARS-CoV-2 viral copies this assay can detect is 138 copies/mL. A negative result does not preclude  SARS-Cov-2 infection and should not be used as the sole basis for treatment or other patient management decisions. A negative result may occur with  improper specimen collection/handling, submission of specimen other than nasopharyngeal swab, presence of viral mutation(s) within the areas targeted by this assay, and inadequate number of viral copies(<138 copies/mL). A negative result must be combined with clinical observations, patient history, and epidemiological information. The expected result is Negative.  Fact Sheet for Patients:  EntrepreneurPulse.com.au  Fact Sheet for Healthcare Providers:  IncredibleEmployment.be  This test is no t yet approved or cleared by the [place] FDA and  has been authorized for detection and/or diagnosis of SARS-CoV-2 by FDA under an Emergency Use Authorization (EUA). This EUA will remain  in effect (meaning this test can be used) for the duration of the COVID-19 declaration under Section 564(b)(1) of the Act, 21  U.S.C.section 360bbb-3(b)(1), unless the authorization is terminated  or revoked sooner.       Influenza A by PCR NEGATIVE NEGATIVE   Influenza B by PCR NEGATIVE NEGATIVE    Comment: (NOTE) The Xpert Xpress SARS-CoV-2/FLU/RSV plus assay is intended as an aid in the diagnosis of influenza from Nasopharyngeal swab specimens and should not be used as a sole basis for treatment. Nasal washings and aspirates are unacceptable for Xpert Xpress SARS-CoV-2/FLU/RSV testing.  Fact Sheet for Patients: EntrepreneurPulse.com.au  Fact Sheet for Healthcare Providers: IncredibleEmployment.be  This test is not yet approved or cleared by the [place] FDA and has been authorized for detection and/or diagnosis of SARS-CoV-2 by FDA under an Emergency Use Authorization (EUA). This EUA will remain in effect (meaning this test can be used) for the duration of the COVID-19  declaration under Section 564(b)(1) of the Act, 21 U.S.C. section 360bbb-3(b)(1), unless the authorization is terminated or revoked.  Performed at [facility], [place] [address] [place], [place] [address]   Lactic acid, plasma     Status: None   Collection Time: 08/14/20  7:26 PM  Result Value Ref Range   Lactic Acid, Venous 1.1 0.5 - 1.9 mmol/L    Comment: Performed at [facility] [address], [place], [place] [address]  Urinalysis, Routine w reflex microscopic Urine, Clean Catch     Status: Abnormal   Collection Time: 08/14/20  9:11 PM  Result Value Ref Range   Color, Urine YELLOW YELLOW   APPearance CLEAR CLEAR   Specific Gravity, Urine 1.014 1.005 - 1.030   pH 5.0 5.0 - 8.0   Glucose, UA NEGATIVE NEGATIVE mg/dL   Hgb urine dipstick SMALL (A) NEGATIVE   Bilirubin Urine NEGATIVE NEGATIVE   Ketones, ur NEGATIVE NEGATIVE mg/dL   Protein, ur NEGATIVE NEGATIVE mg/dL   Nitrite NEGATIVE NEGATIVE   Leukocytes,Ua NEGATIVE NEGATIVE   RBC / HPF 0-5 0 - 5 RBC/hpf   WBC, UA 0-5 0 - 5 WBC/hpf   Bacteria, UA NONE SEEN NONE SEEN   Squamous Epithelial / LPF 0-5 0 - 5    Comment: Performed at [facility], [place] [address], [place], [place] [address]  Urine culture     Status: Abnormal   Collection Time: 08/14/20  9:11 PM   Specimen: Urine, Clean Catch  Result Value Ref Range   Specimen Description URINE, CLEAN CATCH    Special Requests NONE    Culture (A)     <10,000 COLONIES/mL INSIGNIFICANT GROWTH Performed at [facility] [address] [place], [place] [address]    Report Status 08/16/2020 FINAL   Troponin I (High Sensitivity)     Status: Abnormal   Collection Time: 08/14/20 11:49 PM  Result Value Ref Range   Troponin I (High Sensitivity) 36 (H) <18 ng/L    Comment: (NOTE) Elevated high sensitivity troponin I (hsTnI) values and significant  changes across serial measurements may suggest ACS but many other  chronic and acute conditions are known to  elevate hsTnI results.  Refer to the "Links" section for chest pain algorithms and additional  guidance. Performed at [facility], [place] [address] [place], [place] [address]   Brain natriuretic peptide     Status: Abnormal   Collection Time: 08/15/20 12:32 AM  Result Value Ref Range   B Natriuretic Peptide 598.4 (H) 0.0 - 100.0 pg/mL    Comment: Performed at [facility] [place],  [place] [address]  TSH     Status: Abnormal   Collection Time: 08/15/20 12:32 AM  Result Value Ref Range   TSH 4.836 (H) 0.350 - 4.500 uIU/mL    Comment: Performed by a 3rd Generation assay with a functional sensitivity of <=0.01 uIU/mL. Performed at [facility], [place] [address], [place], [place] [address]   Troponin I (High Sensitivity)     Status: Abnormal   Collection Time: 08/15/20 12:32 AM  Result Value Ref Range   Troponin I (High Sensitivity) 34 (H) <18 ng/L    Comment: (NOTE) Elevated high sensitivity troponin I (hsTnI) values and significant  changes across serial measurements may suggest ACS but many other  chronic and acute conditions are known to elevate hsTnI results.  Refer to the "Links" section for chest pain algorithms and additional  guidance. Performed at [facility], [place] [address], [place], [place] [address]   CBC     Status: Abnormal   Collection Time: 08/15/20  5:53 AM  Result Value Ref Range   WBC 10.2 4.0 - 10.5 K/uL   RBC 3.15 (L) 3.87 - 5.11 MIL/uL   Hemoglobin 10.2 (L) 12.0 - 15.0 g/dL   HCT 30.8 (L) 36.0 - 46.0 %   MCV 97.8 80.0 - 100.0 fL   MCH 32.4 26.0 - 34.0 pg   MCHC 33.1 30.0 - 36.0 g/dL   RDW 13.2 11.5 - 15.5 %   Platelets 190 150 - 400 K/uL   nRBC 0.0 0.0 - 0.2 %    Comment: Performed at [facility], [place] [address], [place], [place] [address]  T4, free     Status: Abnormal   Collection Time: 08/15/20  8:30 AM  Result Value Ref Range   Free T4 1.13 (H) 0.61 - 1.12 ng/dL    Comment: (NOTE) Biotin ingestion  may interfere with free T4 tests. If the results are inconsistent with the TSH level, previous test results, or the clinical presentation, then consider biotin interference. If needed, order repeat testing after stopping biotin. Performed at [facility], [place] [address], [place], [place] [address]   Protime-INR     Status: Abnormal   Collection Time: 08/15/20  8:30 AM  Result Value Ref Range   Prothrombin Time 30.4 (H) 11.4 - 15.2 seconds   INR 2.9 (H) 0.8 - 1.2    Comment: (NOTE) INR goal varies based on device and disease states. Performed at [facility], [place] [address] [place], [place] [id number]   Basic metabolic panel     Status: Abnormal   Collection Time: 08/15/20  8:30 AM  Result Value Ref Range   Sodium 138 135 - 145 mmol/L   Potassium 3.2 (L) 3.5 - 5.1 mmol/L   Chloride 108 98 - 111 mmol/L   CO2 24 22 - 32 mmol/L   Glucose, Bld 152 (H) 70 - 99 mg/dL    Comment: Glucose reference range applies only to samples taken after fasting for at least 8 hours.   BUN 20 8 - 23 mg/dL   Creatinine, Ser 0.90 0.44 - 1.00 mg/dL   Calcium 8.3 (L) 8.9 - 10.3 mg/dL   GFR, Estimated >60 >60 mL/min    Comment: (NOTE) Calculated using the CKD-EPI Creatinine Equation (2021)    Anion gap 6 5 - 15    Comment: Performed at [facility] [address], [place], [place] [address]  Magnesium     Status: None  Collection Time: 08/15/20  8:30 AM  Result Value Ref Range   Magnesium 2.1 1.7 - 2.4 mg/dL    Comment: Performed at [facility], [place] [address], [place], [place] [address]  Procalcitonin     Status: None   Collection Time: 08/15/20  8:30 AM  Result Value Ref Range   Procalcitonin 0.30 ng/mL    Comment:        Interpretation: PCT (Procalcitonin) <= 0.5 ng/mL: Systemic infection (sepsis) is not likely. Local bacterial infection is possible. (NOTE)       Sepsis PCT Algorithm           Lower Respiratory Tract                                      Infection  PCT Algorithm    ----------------------------     ----------------------------         PCT < 0.25 ng/mL                PCT < 0.10 ng/mL          Strongly encourage             Strongly discourage   discontinuation of antibiotics    initiation of antibiotics    ----------------------------     -----------------------------       PCT 0.25 - 0.50 ng/mL            PCT 0.10 - 0.25 ng/mL               OR       >80% decrease in PCT            Discourage initiation of                                            antibiotics      Encourage discontinuation           of antibiotics    ----------------------------     -----------------------------         PCT >= 0.50 ng/mL              PCT 0.26 - 0.50 ng/mL               AND        <80% decrease in PCT             Encourage initiation of                                             antibiotics       Encourage continuation           of antibiotics    ----------------------------     -----------------------------        PCT >= 0.50 ng/mL                  PCT > 0.50 ng/mL               AND         increase in PCT                  Strongly encourage  initiation of antibiotics    Strongly encourage escalation           of antibiotics                                     -----------------------------                                           PCT <= 0.25 ng/mL                                                 OR                                        > 80% decrease in PCT                                      Discontinue / Do not initiate                                             antibiotics  Performed at [facility], [address] [place], [place] [address]   Phosphorus     Status: None   Collection Time: 08/15/20  8:30 AM  Result Value Ref Range   Phosphorus 2.6 2.5 - 4.6 mg/dL    Comment: Performed at [facility] [address], [place], [place] [address]  Glucose, capillary     Status: None    Collection Time: 08/15/20  3:53 PM  Result Value Ref Range   Glucose-Capillary 98 70 - 99 mg/dL    Comment: Glucose reference range applies only to samples taken after fasting for at least 8 hours.  Protime-INR     Status: Abnormal   Collection Time: 08/16/20 12:45 AM  Result Value Ref Range   Prothrombin Time 31.0 (H) 11.4 - 15.2 seconds   INR 3.0 (H) 0.8 - 1.2    Comment: (NOTE) INR goal varies based on device and disease states. Performed at [facility], [place] [address] [place], [place] [address]   CBC     Status: Abnormal   Collection Time: 08/16/20 12:45 AM  Result Value Ref Range   WBC 9.2 4.0 - 10.5 K/uL   RBC 3.35 (L) 3.87 - 5.11 MIL/uL   Hemoglobin 10.6 (L) 12.0 - 15.0 g/dL   HCT 32.8 (L) 36.0 - 46.0 %   MCV 97.9 80.0 - 100.0 fL   MCH 31.6 26.0 - 34.0 pg   MCHC 32.3 30.0 - 36.0 g/dL   RDW 12.9 11.5 - 15.5 %   Platelets 186 150 - 400 K/uL   nRBC 0.0 0.0 - 0.2 %    Comment: Performed at [facility], [place] [address] [place], [place] [id number]  Basic metabolic panel     Status: Abnormal   Collection Time: 08/16/20 12:45 AM  Result  Value Ref Range   Sodium 140 135 - 145 mmol/L   Potassium 3.6 3.5 - 5.1 mmol/L   Chloride 108 98 - 111 mmol/L   CO2 25 22 - 32 mmol/L   Glucose, Bld 100 (H) 70 - 99 mg/dL    Comment: Glucose reference range applies only to samples taken after fasting for at least 8 hours.   BUN 13 8 - 23 mg/dL   Creatinine, Ser 0.81 0.44 - 1.00 mg/dL   Calcium 8.3 (L) 8.9 - 10.3 mg/dL   GFR, Estimated >60 >60 mL/min    Comment: (NOTE) Calculated using the CKD-EPI Creatinine Equation (2021)    Anion gap 7 5 - 15    Comment: Performed at [facility] [address] [place], [place] [address]    MICRO: reviewed IMAGING: DG Chest 2 View  Result Date: 08/14/2020 CLINICAL DATA:  Shortness of breath. EXAM: CHEST - 2 VIEW COMPARISON:  Chest radiograph dated 09/24/2019. FINDINGS: The heart is enlarged. Vascular calcifications are seen  in the aortic arch. Median sternotomy wires and an aortic valve prosthesis are noted. Mild bilateral lower lung atelectasis/airspace disease. There is no pleural effusion or pneumothorax. Degenerative changes are seen in the spine. IMPRESSION: Mild bilateral lower lung atelectasis/airspace disease. Cardiomegaly. Aortic Atherosclerosis (ICD10-I70.0). Electronically Signed   By: Zerita Boers M.D.   On: 08/14/2020 17:25   VAS [place] UPPER EXTREMITY VENOUS DUPLEX  Result Date: 08/15/2020 UPPER VENOUS STUDY  Patient Name:  GENNA GAREAU  Date of Exam:   08/15/2020 Medical Rec #: [id number]            Accession #:    [id number] Date of Birth: [birth year]             Patient Gender: F Patient Age:   31Y Exam Location:  [facility] Procedure:      VAS [place] UPPER EXTREMITY VENOUS DUPLEX Referring Phys: [id number] ANAND D HONGALGI --------------------------------------------------------------------------------  Indications: Edema, and Erythema Risk Factors: Sepsis, INR 2.4. Anticoagulation: Coumadin. Comparison Study: No prior study on file Performing Technologist: Sharion Dove RVS  Examination Guidelines: A complete evaluation includes B-mode imaging, spectral Doppler, color Doppler, and power Doppler as needed of all accessible portions of each vessel. Bilateral testing is considered an integral part of a complete examination. Limited examinations for reoccurring indications may be performed as noted.  Right Findings: +----------+------------+---------+-----------+----------+---------------------+ RIGHT     CompressiblePhasicitySpontaneousProperties       Summary        +----------+------------+---------+-----------+----------+---------------------+ IJV           Full       Yes       Yes                                    +----------+------------+---------+-----------+----------+---------------------+ Subclavian               Yes       Yes                                     +----------+------------+---------+-----------+----------+---------------------+ Axillary                 Yes       Yes                                    +----------+------------+---------+-----------+----------+---------------------+  Brachial      Full       Yes       Yes                                    +----------+------------+---------+-----------+----------+---------------------+ Radial        Full                                                        +----------+------------+---------+-----------+----------+---------------------+ Ulnar                                               patent with color and                                                            Doppler        +----------+------------+---------+-----------+----------+---------------------+ Cephalic      Full                                                        +----------+------------+---------+-----------+----------+---------------------+ Basilic       Full                                                        +----------+------------+---------+-----------+----------+---------------------+  Left Findings: +----------+------------+---------+-----------+----------+-------+ LEFT      CompressiblePhasicitySpontaneousPropertiesSummary +----------+------------+---------+-----------+----------+-------+ Subclavian               Yes       Yes                      +----------+------------+---------+-----------+----------+-------+  Summary:  Right: No evidence of deep vein thrombosis in the upper extremity. No evidence of superficial vein thrombosis in the upper extremity.  Left: No evidence of superficial vein thrombosis in the upper extremity.  *See table(s) above for measurements and observations.  Diagnosing physician: Servando Snare MD Electronically signed by Servando Snare MD on 08/15/2020 at 2:58:56 PM.    Final    ECHOCARDIOGRAM LIMITED  Result Date: 08/15/2020    ECHOCARDIOGRAM  LIMITED REPORT   Patient Name:   AIMI CASASANTA Date of Exam: 08/15/2020 Medical Rec #:  [id number]           Height:       67.0 in Accession #:    [id number]          Weight:       218.0 lb Date of Birth:  11/01/1945            BSA:          2.098 m Patient Age:  75 years            BP:           121/53 mmHg Patient Gender: F                   HR:           71 bpm. Exam Location:  Inpatient Procedure: Limited Echo, Cardiac Doppler and Limited Color Doppler Indications:    I34.0 Nonrheumatic mitral (valve) insufficiency; S/P Aortic                 Valve Replacement Z95.2  History:        Patient has prior history of Echocardiogram examinations, most                 recent 07/12/2020. Aortic Valve Disease, Arrythmias:Atrial                 Fibrillation; Risk Factors:Hypertension, Dyslipidemia, Sleep                 Apnea and GERD.                 Aortic Valve: 21 mm St. Jude mechanical valve is present in the                 aortic position. Procedure Date: 2006.  Sonographer:    Jonelle Sidle Dance Referring Phys: [place]  1. Poor acoustic windows limit study.  2. Septal flattening consistent with RV volume overload. . Left ventricular ejection fraction, by estimation, is 55%%. The left ventricle has normal function. The left ventricular internal cavity size was moderately to severely dilated. There is mild left ventricular hypertrophy.  3. Right ventricular systolic function is moderately reduced. The right ventricular size is mildly enlarged. There is moderately elevated pulmonary artery systolic pressure.  4. Left atrial size was moderately dilated.  5. Right atrial size was severely dilated.  6. The mitral valve is midly thickened but does not appear restricted in motion. MR jet is central. Suspect MR due to annular dilitation. . Moderate to severe mitral valve regurgitation.  7. 21 mm St Jude Reagent mechanical valve present (procedure date 2006) Not well visualized. Peak and mean gradients  through the valve are 35 and 20 mm Hg respectively. Dimensionless index is 0.3. Compared to report from October 2021, gradients are increased but dimensionless index is relaively unchanged. .  8. Aortic dilatation noted. There is mild dilatation of the aortic root, measuring 39 mm.  9. The inferior vena cava is dilated in size with <50% respiratory variability, suggesting right atrial pressure of 15 mmHg. FINDINGS  Left Ventricle: Septal flattening consistent with RV volume overload. Left ventricular ejection fraction, by estimation, is 55%%. The left ventricle has normal function. The left ventricular internal cavity size was moderately to severely dilated. There  is mild left ventricular hypertrophy. Right Ventricle: The right ventricular size is mildly enlarged. Right ventricular systolic function is moderately reduced. There is moderately elevated pulmonary artery systolic pressure. The tricuspid regurgitant velocity is 3.58 m/s, and with an assumed right atrial pressure of 8 mmHg, the estimated right ventricular systolic pressure is [id number] mmHg. Left Atrium: Left atrial size was moderately dilated. Right Atrium: Right atrial size was severely dilated. Pericardium: There is no evidence of pericardial effusion. Mitral Valve: The mitral valve is midly thickened but does not appear restricted in motion. MR jet is central. Suspect MR due to annular dilitation. There is mild thickening of  the mitral valve leaflet(s). Mild mitral annular calcification. Moderate to severe mitral valve regurgitation. Aortic Valve: 21 mm St Jude Reagent mechanical valve present (procedure date 2006) Not well visualized. Peak and mean gradients through the valve are 35 and 20 mm Hg respectively. Dimensionless index is 0.3. Compared to report from October 2021, gradients are increased but dimensionless index is relatively unchanged. The aortic valve has been repaired/replaced. Aortic valve mean gradient measures 19.8 mmHg. Aortic valve peak  gradient measures 33.0 mmHg. Aortic valve area, by VTI measures 0.86 cm. There is a 21 mm St. Jude mechanical valve present in the aortic position. Procedure Date: 2006. Pulmonic Valve: The pulmonic valve was normal in structure. Pulmonic valve regurgitation is trivial. Aorta: Aortic dilatation noted. There is mild dilatation of the aortic root, measuring 39 mm. Venous: The inferior vena cava is dilated in size with less than 50% respiratory variability, suggesting right atrial pressure of 15 mmHg. LEFT VENTRICLE PLAX 2D LVIDd:         5.90 cm LVIDs:         3.40 cm LV PW:         1.10 cm LV IVS:        1.20 cm LVOT diam:     1.90 cm LV SV:         56 LV SV Index:   27 LVOT Area:     2.84 cm  IVC IVC diam: 2.80 cm LEFT ATRIUM         Index LA diam:    5.20 cm 2.48 cm/m  AORTIC VALVE AV Area (Vmax):    0.81 cm AV Area (Vmean):   0.80 cm AV Area (VTI):     0.86 cm AV Vmax:           287.25 cm/s AV Vmean:          212.500 cm/s AV VTI:            0.650 m AV Peak Grad:      33.0 mmHg AV Mean Grad:      19.8 mmHg LVOT Vmax:         81.60 cm/s LVOT Vmean:        60.050 cm/s LVOT VTI:          0.198 m LVOT/AV VTI ratio: 0.30  AORTA Ao Root diam: 3.90 cm Ao Asc diam:  4.10 cm MITRAL VALVE                 TRICUSPID VALVE MV Area (PHT): 3.03 cm      TR Peak grad:   51.3 mmHg MV Decel Time: 250 msec      TR Vmax:        358.00 cm/s MR Peak grad:    124.1 mmHg MR Mean grad:    80.5 mmHg   SHUNTS MR Vmax:         557.00 cm/s Systemic VTI:  0.20 m MR Vmean:        423.0 cm/s  Systemic Diam: 1.90 cm MR PISA:         2.26 cm MR PISA Eff ROA: 16 mm MR PISA Radius:  0.60 cm MV E velocity: 148.00 cm/s MV A velocity: 80.20 cm/s MV E/A ratio:  1.85 Dorris Carnes MD Electronically signed by Dorris Carnes MD Signature Date/Time: 08/15/2020/6:42:18 PM    Final    Assessment/Plan:  75yo F with prosthetic AVR admitted with systemic symptoms and right arm cellulitis - recommend to change abtx to  vancomycin and cefazolin. Await blood  cultures to see if she was bacteremic. - she maybe candidate for long acting abtx such as oritavancin (for cellulitis)  - if blood cx are positive, will need TEE to evaluate for endocarditis  Hx of Prosthetic valve = continue on coumadin for anticoagulation

## 2020-08-17 LAB — BASIC METABOLIC PANEL
Anion gap: 9 (ref 5–15)
BUN: 12 mg/dL (ref 8–23)
CO2: 22 mmol/L (ref 22–32)
Calcium: 8.2 mg/dL — ABNORMAL LOW (ref 8.9–10.3)
Chloride: 108 mmol/L (ref 98–111)
Creatinine, Ser: 0.86 mg/dL (ref 0.44–1.00)
GFR, Estimated: >60 mL/min (ref >60)
Glucose, Bld: 96 mg/dL (ref 70–99)
Potassium: 3.8 mmol/L (ref 3.5–5.1)
Sodium: 139 mmol/L (ref 135–145)

## 2020-08-17 LAB — CBC
HCT: 30.6 % — ABNORMAL LOW (ref 36.0–46.0)
Hemoglobin: 9.8 g/dL — ABNORMAL LOW (ref 12.0–15.0)
MCH: 31.6 pg (ref 26.0–34.0)
MCHC: 32 g/dL (ref 30.0–36.0)
MCV: 98.7 fL (ref 80.0–100.0)
Platelets: 202 10*3/uL (ref 150–400)
RBC: 3.1 MIL/uL — ABNORMAL LOW (ref 3.87–5.11)
RDW: 13 % (ref 11.5–15.5)
WBC: 7.2 10*3/uL (ref 4.0–10.5)
nRBC: 0 % (ref 0.0–0.2)

## 2020-08-17 LAB — PROTIME-INR
INR: 2.8 — ABNORMAL HIGH (ref 0.8–1.2)
Prothrombin Time: 29.2 seconds — ABNORMAL HIGH (ref 11.4–15.2)

## 2020-08-17 MED ORDER — WARFARIN SODIUM 2 MG PO TABS
2.0000 mg | ORAL_TABLET | Freq: Once | ORAL | Status: AC
  Administered 2020-08-17: 2 mg via ORAL
  Filled 2020-08-17: qty 1

## 2020-08-17 NOTE — Progress Notes (Signed)
Progress Note  Patient Name: [name] Date of Encounter: 08/17/2020  Primary Cardiologist: Sanda Klein, MD   Subjective   Overnight ID consulted. Using her home CPAP. Patient notes physically feeling better but feeling depressed.  Inpatient Medications    Scheduled Meds: . carvedilol  3.125 mg Oral BID WC  . cholecalciferol  4,000 Units Oral Daily  . ferrous sulfate  325 mg Oral Q breakfast  . furosemide  40 mg Oral BID  . levothyroxine  100 mcg Oral QAC breakfast  . multivitamin with minerals  1 tablet Oral Daily  . potassium chloride SA  40 mEq Oral BID  . pravastatin  20 mg Oral Daily  . sertraline  100 mg Oral Daily  . Warfarin - Pharmacist Dosing Inpatient   Does not apply q1600   Continuous Infusions: .  ceFAZolin (ANCEF) IV 2 g (08/17/20 0545)  . vancomycin 1,000 mg (08/16/20 2021)   PRN Meds: acetaminophen, ALPRAZolam, melatonin, ondansetron (ZOFRAN) IV   Vital Signs    Vitals:   08/16/20 2014 08/16/20 2334 08/16/20 2359 08/17/20 0300  BP: 121/60 (!) 109/56  (!) 96/4  Pulse: 67 67  (!) 55  Resp: 17 14  15   Temp: 99 F (37.2 C) 98.6 F (37 C)  98.5 F (36.9 C)  TempSrc: Oral Oral  Axillary  SpO2: 97% 96% 96% 99%  Weight:    99.1 kg  Height:        Intake/Output Summary (Last 24 hours) at 08/17/2020 0730 Last data filed at 08/17/2020 0600 Gross per 24 hour  Intake 1200 ml  Output --  Net 1200 ml   Filed Weights   08/15/20 0012 08/16/20 0408 08/17/20 0300  Weight: 98.9 kg 98.3 kg 99.1 kg    Telemetry    AF rates up to 90s ~ 5 AM - Personally Reviewed  ECG    No new - Personally Reviewed  Physical Exam   GEN: No acute distress.   Neck: No JVD at 90 degrees Cardiac: Irregular rate and rhythm, Prominent mechanical heart sounds; soft systolic murmur Respiratory: Clear to auscultation bilaterally. GI: Soft, nontender, non-distended  MS: Non-pitting edema; No deformity. R arm is no longer warm to touch. Neuro:  Nonfocal   Psych: Normal affect   Labs    Chemistry Recent Labs  Lab 08/14/20 1730 08/14/20 1736 08/15/20 0830 08/16/20 0045 08/17/20 0015  NA 138   < > 138 140 139  K 4.2   < > 3.2* 3.6 3.8  CL 106  --  108 108 108  CO2 23  --  24 25 22   GLUCOSE 121*  --  152* 100* 96  BUN 28*  --  20 13 12   CREATININE 1.02*  --  0.90 0.81 0.86  CALCIUM 9.4  --  8.3* 8.3* 8.2*  PROT 7.4  --   --   --   --   ALBUMIN 3.8  --   --   --   --   AST 26  --   --   --   --   ALT 19  --   --   --   --   ALKPHOS 63  --   --   --   --   BILITOT 0.9  --   --   --   --   GFRNONAA 57*  --  >60 >60 >60  ANIONGAP 9  --  6 7 9    < > = values in this interval  not displayed.     Hematology Recent Labs  Lab 08/15/20 0553 08/16/20 0045 08/17/20 0015  WBC 10.2 9.2 7.2  RBC 3.15* 3.35* 3.10*  HGB 10.2* 10.6* 9.8*  HCT 30.8* 32.8* 30.6*  MCV 97.8 97.9 98.7  MCH 32.4 31.6 31.6  MCHC 33.1 32.3 32.0  RDW 13.2 12.9 13.0  PLT 190 186 202    Cardiac EnzymesNo results for input(s): TROPONINI in the last 168 hours. No results for input(s): TROPIPOC in the last 168 hours.   BNP Recent Labs  Lab 08/15/20 0032  BNP 598.4*     DDimer No results for input(s): DDIMER in the last 168 hours.   Radiology    VAS [place] UPPER EXTREMITY VENOUS DUPLEX  Result Date: 08/15/2020 UPPER VENOUS STUDY  Patient Name:  [name]  Date of Exam:   08/15/2020 Medical Rec #: [id number]            Accession #:    [id number] Date of Birth: [birth year]             Patient Gender: F Patient Age:   70Y Exam Location:  [facility] Procedure:      VAS [place] UPPER EXTREMITY VENOUS DUPLEX Referring Phys: [id number] ANAND D HONGALGI --------------------------------------------------------------------------------  Indications: Edema, and Erythema Risk Factors: Sepsis, INR 2.4. Anticoagulation: Coumadin. Comparison Study: No prior study on file Performing Technologist: Sharion Dove RVS  Examination Guidelines: A complete evaluation includes  B-mode imaging, spectral Doppler, color Doppler, and power Doppler as needed of all accessible portions of each vessel. Bilateral testing is considered an integral part of a complete examination. Limited examinations for reoccurring indications may be performed as noted.  Right Findings: +----------+------------+---------+-----------+----------+---------------------+ RIGHT     CompressiblePhasicitySpontaneousProperties       Summary        +----------+------------+---------+-----------+----------+---------------------+ IJV           Full       Yes       Yes                                    +----------+------------+---------+-----------+----------+---------------------+ Subclavian               Yes       Yes                                    +----------+------------+---------+-----------+----------+---------------------+ Axillary                 Yes       Yes                                    +----------+------------+---------+-----------+----------+---------------------+ Brachial      Full       Yes       Yes                                    +----------+------------+---------+-----------+----------+---------------------+ Radial        Full                                                        +----------+------------+---------+-----------+----------+---------------------+  Ulnar                                               patent with color and                                                            Doppler        +----------+------------+---------+-----------+----------+---------------------+ Cephalic      Full                                                        +----------+------------+---------+-----------+----------+---------------------+ Basilic       Full                                                        +----------+------------+---------+-----------+----------+---------------------+  Left Findings:  +----------+------------+---------+-----------+----------+-------+ LEFT      CompressiblePhasicitySpontaneousPropertiesSummary +----------+------------+---------+-----------+----------+-------+ Subclavian               Yes       Yes                      +----------+------------+---------+-----------+----------+-------+  Summary:  Right: No evidence of deep vein thrombosis in the upper extremity. No evidence of superficial vein thrombosis in the upper extremity.  Left: No evidence of superficial vein thrombosis in the upper extremity.  *See table(s) above for measurements and observations.  Diagnosing physician: Servando Snare MD Electronically signed by Servando Snare MD on 08/15/2020 at 2:58:56 PM.    Final    ECHOCARDIOGRAM LIMITED  Result Date: 08/15/2020    ECHOCARDIOGRAM LIMITED REPORT   Patient Name:   [name] Date of Exam: 08/15/2020 Medical Rec #:  [id number]           Height:       67.0 in Accession #:    [id number]          Weight:       218.0 lb Date of Birth:  1946/03/24            BSA:          2.098 m Patient Age:    43 years            BP:           121/53 mmHg Patient Gender: F                   HR:           71 bpm. Exam Location:  Inpatient Procedure: Limited Echo, Cardiac Doppler and Limited Color Doppler Indications:    I34.0 Nonrheumatic mitral (valve) insufficiency; S/P Aortic                 Valve Replacement Z95.2  History:        Patient has prior history of Echocardiogram examinations, most  recent 07/12/2020. Aortic Valve Disease, Arrythmias:Atrial                 Fibrillation; Risk Factors:Hypertension, Dyslipidemia, Sleep                 Apnea and GERD.                 Aortic Valve: 21 mm St. Jude mechanical valve is present in the                 aortic position. Procedure Date: 2006.  Sonographer:    Jonelle Sidle Dance Referring Phys: [place]  1. Poor acoustic windows limit study.  2. Septal flattening consistent with RV volume  overload. . Left ventricular ejection fraction, by estimation, is 55%%. The left ventricle has normal function. The left ventricular internal cavity size was moderately to severely dilated. There is mild left ventricular hypertrophy.  3. Right ventricular systolic function is moderately reduced. The right ventricular size is mildly enlarged. There is moderately elevated pulmonary artery systolic pressure.  4. Left atrial size was moderately dilated.  5. Right atrial size was severely dilated.  6. The mitral valve is midly thickened but does not appear restricted in motion. MR jet is central. Suspect MR due to annular dilitation. . Moderate to severe mitral valve regurgitation.  7. 21 mm St Jude Reagent mechanical valve present (procedure date 2006) Not well visualized. Peak and mean gradients through the valve are 35 and 20 mm Hg respectively. Dimensionless index is 0.3. Compared to report from October 2021, gradients are increased but dimensionless index is relaively unchanged. .  8. Aortic dilatation noted. There is mild dilatation of the aortic root, measuring 39 mm.  9. The inferior vena cava is dilated in size with <50% respiratory variability, suggesting right atrial pressure of 15 mmHg. FINDINGS  Left Ventricle: Septal flattening consistent with RV volume overload. Left ventricular ejection fraction, by estimation, is 55%%. The left ventricle has normal function. The left ventricular internal cavity size was moderately to severely dilated. There  is mild left ventricular hypertrophy. Right Ventricle: The right ventricular size is mildly enlarged. Right ventricular systolic function is moderately reduced. There is moderately elevated pulmonary artery systolic pressure. The tricuspid regurgitant velocity is 3.58 m/s, and with an assumed right atrial pressure of 8 mmHg, the estimated right ventricular systolic pressure is 16.6 mmHg. Left Atrium: Left atrial size was moderately dilated. Right Atrium: Right atrial  size was severely dilated. Pericardium: There is no evidence of pericardial effusion. Mitral Valve: The mitral valve is midly thickened but does not appear restricted in motion. MR jet is central. Suspect MR due to annular dilitation. There is mild thickening of the mitral valve leaflet(s). Mild mitral annular calcification. Moderate to severe mitral valve regurgitation. Aortic Valve: 21 mm St Jude Reagent mechanical valve present (procedure date 2006) Not well visualized. Peak and mean gradients through the valve are 35 and 20 mm Hg respectively. Dimensionless index is 0.3. Compared to report from October 2021, gradients are increased but dimensionless index is relatively unchanged. The aortic valve has been repaired/replaced. Aortic valve mean gradient measures 19.8 mmHg. Aortic valve peak gradient measures 33.0 mmHg. Aortic valve area, by VTI measures 0.86 cm. There is a 21 mm St. Jude mechanical valve present in the aortic position. Procedure Date: 2006. Pulmonic Valve: The pulmonic valve was normal in structure. Pulmonic valve regurgitation is trivial. Aorta: Aortic dilatation noted. There is mild dilatation of the aortic root, measuring  39 mm. Venous: The inferior vena cava is dilated in size with less than 50% respiratory variability, suggesting right atrial pressure of 15 mmHg. LEFT VENTRICLE PLAX 2D LVIDd:         5.90 cm LVIDs:         3.40 cm LV PW:         1.10 cm LV IVS:        1.20 cm LVOT diam:     1.90 cm LV SV:         56 LV SV Index:   27 LVOT Area:     2.84 cm  IVC IVC diam: 2.80 cm LEFT ATRIUM         Index LA diam:    5.20 cm 2.48 cm/m  AORTIC VALVE AV Area (Vmax):    0.81 cm AV Area (Vmean):   0.80 cm AV Area (VTI):     0.86 cm AV Vmax:           287.25 cm/s AV Vmean:          212.500 cm/s AV VTI:            0.650 m AV Peak Grad:      33.0 mmHg AV Mean Grad:      19.8 mmHg LVOT Vmax:         81.60 cm/s LVOT Vmean:        60.050 cm/s LVOT VTI:          0.198 m LVOT/AV VTI ratio: 0.30   AORTA Ao Root diam: 3.90 cm Ao Asc diam:  4.10 cm MITRAL VALVE                 TRICUSPID VALVE MV Area (PHT): 3.03 cm      TR Peak grad:   51.3 mmHg MV Decel Time: 250 msec      TR Vmax:        358.00 cm/s MR Peak grad:    124.1 mmHg MR Mean grad:    80.5 mmHg   SHUNTS MR Vmax:         557.00 cm/s Systemic VTI:  0.20 m MR Vmean:        423.0 cm/s  Systemic Diam: 1.90 cm MR PISA:         2.26 cm MR PISA Eff ROA: 16 mm MR PISA Radius:  0.60 cm MV E velocity: 148.00 cm/s MV A velocity: 80.20 cm/s MV E/A ratio:  1.85 Dorris Carnes MD Electronically signed by Dorris Carnes MD Signature Date/Time: 08/15/2020/6:42:18 PM    Final     Cardiac Studies   Transesophageal Echocardiogram: Date: 07/12/20 Results: 1. Left ventricular ejection fraction, by estimation, is 50 to 55%. The  left ventricle has low normal function. The left ventricle demonstrates  global hypokinesis. Left ventricular diastolic function could not be  evaluated.   2. Left atrial size was severely dilated. No left atrial/left atrial  appendage thrombus was detected. The LAA emptying velocity was 50 cm/s.  (the rhythm was atrial flutter).   3. Right atrial size was severely dilated.   4. The mitral valve is rheumatic. Moderate to severe mitral valve  regurgitation. No evidence of mitral stenosis. The mean mitral valve  gradient is 3.0 mmHg.   5. Tricuspid valve regurgitation is moderate.   6. The aortic valve has been repaired/replaced. Aortic valve  regurgitation is trivial. No aortic stenosis is present. There is a 19 mm  mechanical valve present in the aortic position. Echo  findings are  consistent with normal structure and function of  the aortic valve prosthesis. Aortic valve mean gradient measures 3.0 mmHg.  Aortic valve Vmax measures 1.09 m/s.   7. Aortic dilatation noted. There is mild dilatation of the ascending  aorta, measuring 40 mm. There is Moderate (Grade III) atheroma plaque  involving the descending aorta.   8. Evidence  of atrial level shunting detected by color flow Doppler.  There is a small patent foramen ovale with predominantly left to right  shunting across the atrial septum.   9. Right ventricular systolic function is normal. The right ventricular  size is normal. There is mildly elevated pulmonary artery systolic  pressure.   Patient Profile     75 y.o. 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  Assessment & Plan    Sepsis secondary to cellulitis S/p AVR with prior subdural hematoma hx:  On coumadin with INR goal 2-2.5 (08/16/20 at 2.8) Severe Mitral Regurgitation Moderate Thoracic Aortic Dilation Permanent AF  - continue warfarin; no ASA (goal 2-2.5) - no evidence of valvular vegetation - continue pravastatin - continue coreg 3.125 - continue lasix 40 mg PO BID - SBP 90-100 in the setting of resolving infection; will not plan to restart Entresto, unless BP remains 110-120s/70-80s; may need outpatient restart, and would start at 24/26 dose.  [facility] will sign off.   Medication Recommendations:  Holding Entresto Other recommendations (labs, testing, etc):  NA Follow up as an outpatient:  Working on Clinic and structural f/u      For questions or updates, please contact [facility] Please consult [email or web address] for contact info under Cardiology/STEMI.      Signed, Werner Lean, MD  08/17/2020, 7:30 AM

## 2020-08-17 NOTE — Progress Notes (Addendum)
[place] for Infectious Disease    Date of Admission:  08/14/2020   Total days of antibiotics 4           ID: [name] is a 75 y.o. female with  Hx of AVR presents with right arm cellulitis Active Problems:   Sepsis ([place])    Subjective: Afebrile, swelling and erythema to right arm continue to improve. She no longer has post prandial diarrhea.  ROS: 12 point ros is negative, no n/v/d/constipation  Medications:  . carvedilol  3.125 mg Oral BID WC  . cholecalciferol  4,000 Units Oral Daily  . ferrous sulfate  325 mg Oral Q breakfast  . furosemide  40 mg Oral BID  . levothyroxine  100 mcg Oral QAC breakfast  . multivitamin with minerals  1 tablet Oral Daily  . potassium chloride SA  40 mEq Oral BID  . pravastatin  20 mg Oral Daily  . sertraline  100 mg Oral Daily  . warfarin  2 mg Oral ONCE-1600  . Warfarin - Pharmacist Dosing Inpatient   Does not apply q1600    Objective: Vital signs in last 24 hours: Temp:  [98.1 F (36.7 C)-99 F (37.2 C)] 98.1 F (36.7 C) (05/11 0741) Pulse Rate:  [55-67] 67 (05/11 0814) Resp:  [13-19] 13 (05/11 0741) BP: (95-121)/(4-66) 110/66 (05/11 0741) SpO2:  [96 %-99 %] 97 % (05/11 0741) Weight:  [99.1 kg] 99.1 kg (05/11 0300) Physical Exam  Constitutional:  oriented to person, place, and time. appears well-developed and well-nourished. No distress.  HENT: [place]/AT, PERRLA, no scleral icterus Mouth/Throat: Oropharynx is clear and moist. No oropharyngeal exudate.  Cardiovascular: Normal rate, mechanical heart sound. No other murmur noted Pulmonary/Chest: Effort normal and breath sounds normal. No respiratory distress.  has no wheezes.  Neck = supple, no nuchal rigidity Abdominal: Soft. Bowel sounds are normal.  exhibits no distension. There is no tenderness.  Lymphadenopathy: no cervical adenopathy. No axillary adenopathy Neurological: alert and oriented to person, place, and time.  Skin: Skin is warm and dry. No rash noted.  No erythema.  Ext: less swelling to right arm silghtly still present. Slight erythema distal to elbow Psychiatric: a normal mood and affect.  behavior is normal.   Lab Results Recent Labs    08/16/20 0045 08/17/20 0015  WBC 9.2 7.2  HGB 10.6* 9.8*  HCT 32.8* 30.6*  NA 140 139  K 3.6 3.8  CL 108 108  CO2 25 22  BUN 13 12  CREATININE 0.81 0.86   Liver Panel Recent Labs    08/14/20 1730  PROT 7.4  ALBUMIN 3.8  AST 26  ALT 19  ALKPHOS 63  BILITOT 0.9  BILIDIR 0.1  IBILI 0.8    Microbiology: Blood cx ngtd at nearly 72hr Studies/Results: ECHOCARDIOGRAM LIMITED  Result Date: 08/15/2020    ECHOCARDIOGRAM LIMITED REPORT   Patient Name:   [name] Date of Exam: 08/15/2020 Medical Rec #:  [id number]           Height:       67.0 in Accession #:    [id number]          Weight:       218.0 lb Date of Birth:  09-27-1945            BSA:          2.098 m Patient Age:    43 years            BP:  121/53 mmHg Patient Gender: F                   HR:           71 bpm. Exam Location:  Inpatient Procedure: Limited Echo, Cardiac Doppler and Limited Color Doppler Indications:    I34.0 Nonrheumatic mitral (valve) insufficiency; S/P Aortic                 Valve Replacement Z95.2  History:        Patient has prior history of Echocardiogram examinations, most                 recent 07/12/2020. Aortic Valve Disease, Arrythmias:Atrial                 Fibrillation; Risk Factors:Hypertension, Dyslipidemia, Sleep                 Apnea and GERD.                 Aortic Valve: 21 mm St. Jude mechanical valve is present in the                 aortic position. Procedure Date: 2006.  Sonographer:    Jonelle Sidle Dance Referring Phys: [place]  1. Poor acoustic windows limit study.  2. Septal flattening consistent with RV volume overload. . Left ventricular ejection fraction, by estimation, is 55%%. The left ventricle has normal function. The left ventricular internal cavity size was  moderately to severely dilated. There is mild left ventricular hypertrophy.  3. Right ventricular systolic function is moderately reduced. The right ventricular size is mildly enlarged. There is moderately elevated pulmonary artery systolic pressure.  4. Left atrial size was moderately dilated.  5. Right atrial size was severely dilated.  6. The mitral valve is midly thickened but does not appear restricted in motion. MR jet is central. Suspect MR due to annular dilitation. . Moderate to severe mitral valve regurgitation.  7. 21 mm St Jude Reagent mechanical valve present (procedure date 2006) Not well visualized. Peak and mean gradients through the valve are 35 and 20 mm Hg respectively. Dimensionless index is 0.3. Compared to report from October 2021, gradients are increased but dimensionless index is relaively unchanged. .  8. Aortic dilatation noted. There is mild dilatation of the aortic root, measuring 39 mm.  9. The inferior vena cava is dilated in size with <50% respiratory variability, suggesting right atrial pressure of 15 mmHg. FINDINGS  Left Ventricle: Septal flattening consistent with RV volume overload. Left ventricular ejection fraction, by estimation, is 55%%. The left ventricle has normal function. The left ventricular internal cavity size was moderately to severely dilated. There  is mild left ventricular hypertrophy. Right Ventricle: The right ventricular size is mildly enlarged. Right ventricular systolic function is moderately reduced. There is moderately elevated pulmonary artery systolic pressure. The tricuspid regurgitant velocity is 3.58 m/s, and with an assumed right atrial pressure of 8 mmHg, the estimated right ventricular systolic pressure is [id number] mmHg. Left Atrium: Left atrial size was moderately dilated. Right Atrium: Right atrial size was severely dilated. Pericardium: There is no evidence of pericardial effusion. Mitral Valve: The mitral valve is midly thickened but does not appear  restricted in motion. MR jet is central. Suspect MR due to annular dilitation. There is mild thickening of the mitral valve leaflet(s). Mild mitral annular calcification. Moderate to severe mitral valve regurgitation. Aortic Valve: 21 mm St Jude Reagent mechanical valve  present (procedure date 2006) Not well visualized. Peak and mean gradients through the valve are 35 and 20 mm Hg respectively. Dimensionless index is 0.3. Compared to report from October 2021, gradients are increased but dimensionless index is relatively unchanged. The aortic valve has been repaired/replaced. Aortic valve mean gradient measures 19.8 mmHg. Aortic valve peak gradient measures 33.0 mmHg. Aortic valve area, by VTI measures 0.86 cm. There is a 21 mm St. Jude mechanical valve present in the aortic position. Procedure Date: 2006. Pulmonic Valve: The pulmonic valve was normal in structure. Pulmonic valve regurgitation is trivial. Aorta: Aortic dilatation noted. There is mild dilatation of the aortic root, measuring 39 mm. Venous: The inferior vena cava is dilated in size with less than 50% respiratory variability, suggesting right atrial pressure of 15 mmHg. LEFT VENTRICLE PLAX 2D LVIDd:         5.90 cm LVIDs:         3.40 cm LV PW:         1.10 cm LV IVS:        1.20 cm LVOT diam:     1.90 cm LV SV:         56 LV SV Index:   27 LVOT Area:     2.84 cm  IVC IVC diam: 2.80 cm LEFT ATRIUM         Index LA diam:    5.20 cm 2.48 cm/m  AORTIC VALVE AV Area (Vmax):    0.81 cm AV Area (Vmean):   0.80 cm AV Area (VTI):     0.86 cm AV Vmax:           287.25 cm/s AV Vmean:          212.500 cm/s AV VTI:            0.650 m AV Peak Grad:      33.0 mmHg AV Mean Grad:      19.8 mmHg LVOT Vmax:         81.60 cm/s LVOT Vmean:        60.050 cm/s LVOT VTI:          0.198 m LVOT/AV VTI ratio: 0.30  AORTA Ao Root diam: 3.90 cm Ao Asc diam:  4.10 cm MITRAL VALVE                 TRICUSPID VALVE MV Area (PHT): 3.03 cm      TR Peak grad:   51.3 mmHg MV Decel  Time: 250 msec      TR Vmax:        358.00 cm/s MR Peak grad:    124.1 mmHg MR Mean grad:    80.5 mmHg   SHUNTS MR Vmax:         557.00 cm/s Systemic VTI:  0.20 m MR Vmean:        423.0 cm/s  Systemic Diam: 1.90 cm MR PISA:         2.26 cm MR PISA Eff ROA: 16 mm MR PISA Radius:  0.60 cm MV E velocity: 148.00 cm/s MV A velocity: 80.20 cm/s MV E/A ratio:  1.85 Dorris Carnes MD Electronically signed by Dorris Carnes MD Signature Date/Time: 08/15/2020/6:42:18 PM    Final      Assessment/Plan: Right forearm cellulitis = presumably GPC/strep/staph, blood cx remain negative. TTE does not show evidence of vegetations. Will narrow abtx to cefazolin today. If still has negative blood tonight/tomorrow morning. We will switch to oral abtx to complete 10 day course  of treatment. OPAT orders in for her.   Diarrhea = has resolved. No need for cdifficile testing  We will see back in the ID clinic for follow up.  [facility] Infectious Diseases Cell: [phone] Pager: [phone]  08/17/2020, 11:15 AM

## 2020-08-17 NOTE — Progress Notes (Signed)
PROGRESS NOTE    [name]  DGU:[id number]  DOB: 1945-10-23  DOA: 08/14/2020 PCP: Maurice Small, MD Outpatient Specialists:   Hospital course:  75 y.o.femalewith medical history significant for permanent A. 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  In the ED, febrile to 103.3 F, code sepsis initiated and admitted for sepsis secondary to right forearm cellulitis.  Improving.  Blood cultures x2, negative to date, ID consulted due to sepsis picture, mechanical valve.  Cardiology on board.   Subjective:  Patient states that she continues to feel better and better each day.  She feels almost close to her normal today.  She is astonished how quickly her cellulitis is getting better.  Notes her arm is still a little swollen but the redness has gotten much much better.    Objective: Vitals:   08/17/20 0741 08/17/20 0814 08/17/20 1207 08/17/20 1605  BP: 110/66  120/61 123/73  Pulse:  67 64 64  Resp: 13  20 19   Temp: 98.1 F (36.7 C)  98.2 F (36.8 C) 98.2 F (36.8 C)  TempSrc: Oral  Oral Oral  SpO2: 97%  99% 97%  Weight:      Height:        Intake/Output Summary (Last 24 hours) at 08/17/2020 1702 Last data filed at 08/17/2020 1438 Gross per 24 hour  Intake 1390 ml  Output 350 ml  Net 1040 ml   Filed Weights   08/15/20 0012 08/16/20 0408 08/17/20 0300  Weight: 98.9 kg 98.3 kg 99.1 kg     Exam:  General: Well-appearing female sitting up in recliner watching TV  in no acute distress. Eyes: sclera anicteric, conjuctiva mild injection bilaterally CVS: S1-S2, regular  Respiratory: CTA  GI: NABS, soft, NT  Extremities : Right arm with minimal erythema around right elbow and forearm.  There is some dependent edema in that area.  No tenderness to palpation.  No real demarcation line. Neuro: A/O x 3, Moving all extremities equally with normal strength, CN 3-12 intact, grossly nonfocal.  Psych: patient is logical and coherent, judgement and insight appear normal, mood and affect appropriate to situation.   Assessment & Plan:  75 year old female was admitted with severe sepsis secondary to right forearm cellulitis.  Forearm cellulitis Is doing very well on antibiotic treatment Very much appreciate ID involvement ID recommends narrowing to cefazolin today with switch to Keflex and discharge in the morning if patient remains afebrile.  Elevated troponin Peaked at 42, has trended downwards No evidence of ACS  Sepsis Resolved  Diarrhea Resolved  Subdural hematoma in setting of INR greater than 2.2 Neurosurgery has cleared patient to maintain INR between 2-2.5  HTN Continue carvedilol, she is tolerating this well  Atrial fibrillation Continue Coumadin for secondary prevention of stroke Continue carvedilol for rate control  Anxiety and depression Continue sertraline  Combined systolic and diastolic HF/status post AVR/MR Appreciate cardiology consultation, defer management to them  Hypothyroidism Continue Synthroid  DVT prophylaxis: Coumadin Code Status: Full Family Communication: None today Disposition Plan:   Patient is from: Home  Anticipated Discharge Location: Home  Barriers  to Discharge: For discharge tomorrow patient is afebrile with narrowed antibiotics  Is patient medically stable for Discharge: Not yet   Consultants:  ID  Cardiology  Procedures:  None  Antimicrobials:  Recently on Kefzol   Data  Reviewed:  Basic Metabolic Panel: Recent Labs  Lab 08/14/20 1730 08/14/20 1736 08/15/20 0830 08/16/20 0045 08/17/20 0015  NA 138 140 138 140 139  K 4.2 4.1 3.2* 3.6 3.8  CL 106  --  108 108 108  CO2 23  --  24 25 22   GLUCOSE 121*  --  152* 100* 96  BUN 28*  --  20 13 12   CREATININE 1.02*  --  0.90 0.81 0.86  CALCIUM 9.4  --  8.3* 8.3* 8.2*  MG  --   --  2.1  --   --   PHOS  --   --  2.6  --   --    Liver Function Tests: Recent Labs  Lab 08/14/20 1730  AST 26  ALT 19  ALKPHOS 63  BILITOT 0.9  PROT 7.4  ALBUMIN 3.8   No results for input(s): LIPASE, AMYLASE in the last 168 hours. No results for input(s): AMMONIA in the last 168 hours. CBC: Recent Labs  Lab 08/14/20 1730 08/14/20 1736 08/15/20 0553 08/16/20 0045 08/17/20 0015  WBC 16.0*  --  10.2 9.2 7.2  NEUTROABS 13.8*  --   --   --   --   HGB 12.4 12.2 10.2* 10.6* 9.8*  HCT 37.2 36.0 30.8* 32.8* 30.6*  MCV 96.4  --  97.8 97.9 98.7  PLT 236  --  190 186 202   Cardiac Enzymes: No results for input(s): CKTOTAL, CKMB, CKMBINDEX, TROPONINI in the last 168 hours. BNP (last 3 results) No results for input(s): PROBNP in the last 8760 hours. CBG: Recent Labs  Lab 08/15/20 1553  GLUCAP 98    Recent Results (from the past 240 hour(s))  MRSA PCR Screening     Status: None   Collection Time: 08/14/20 12:31 AM   Specimen: Nasopharyngeal  Result Value Ref Range Status   MRSA by PCR NEGATIVE NEGATIVE Final    Comment:        The GeneXpert MRSA Assay (FDA approved for NASAL specimens only), is one component of a comprehensive MRSA colonization surveillance program. It is not intended to diagnose MRSA infection nor to guide or monitor treatment for MRSA infections. Performed at [facility], [place] [address] [place], [place] [address]   Blood culture (routine x 2)     Status: None (Preliminary result)   Collection Time: 08/14/20  5:50 PM   Specimen: BLOOD LEFT FOREARM  Result Value Ref Range Status    Specimen Description BLOOD LEFT FOREARM  Final   Special Requests   Final    BOTTLES DRAWN AEROBIC AND ANAEROBIC Blood Culture adequate volume   Culture   Final    NO GROWTH 3 DAYS Performed at [facility], [place] [address] [place],  [address]    Report Status PENDING  Incomplete  Blood culture (routine x 2)     Status: None (Preliminary result)   Collection Time: 08/14/20  6:16 PM   Specimen: BLOOD  Result Value Ref Range Status   Specimen Description BLOOD RIGHT ANTECUBITAL  Final   Special Requests   Final    BOTTLES DRAWN AEROBIC AND ANAEROBIC Blood Culture adequate volume   Culture   Final    NO GROWTH 3 DAYS Performed  at [facility], [place] [address] [place], [place] [address]    Report Status PENDING  Incomplete  Resp Panel by RT-PCR (Flu A&B, Covid) Nasopharyngeal Swab     Status: None   Collection Time: 08/14/20  6:20 PM   Specimen: Nasopharyngeal Swab; Nasopharyngeal(NP) swabs in vial transport medium  Result Value Ref Range Status   SARS Coronavirus 2 by RT PCR NEGATIVE NEGATIVE Final    Comment: (NOTE) SARS-CoV-2 target nucleic acids are NOT DETECTED.  The SARS-CoV-2 RNA is generally detectable in upper respiratory specimens during the acute phase of infection. The lowest concentration of SARS-CoV-2 viral copies this assay can detect is 138 copies/mL. A negative result does not preclude SARS-Cov-2 infection and should not be used as the sole basis for treatment or other patient management decisions. A negative result may occur with  improper specimen collection/handling, submission of specimen other than nasopharyngeal swab, presence of viral mutation(s) within the areas targeted by this assay, and inadequate number of viral copies(<138 copies/mL). A negative result must be combined with clinical observations, patient history, and epidemiological information. The expected result is Negative.  Fact Sheet for Patients:   EntrepreneurPulse.com.au  Fact Sheet for Healthcare Providers:  IncredibleEmployment.be  This test is no t yet approved or cleared by the [place] FDA and  has been authorized for detection and/or diagnosis of SARS-CoV-2 by FDA under an Emergency Use Authorization (EUA). This EUA will remain  in effect (meaning this test can be used) for the duration of the COVID-19 declaration under Section 564(b)(1) of the Act, 21 U.S.C.section 360bbb-3(b)(1), unless the authorization is terminated  or revoked sooner.       Influenza A by PCR NEGATIVE NEGATIVE Final   Influenza B by PCR NEGATIVE NEGATIVE Final    Comment: (NOTE) The Xpert Xpress SARS-CoV-2/FLU/RSV plus assay is intended as an aid in the diagnosis of influenza from Nasopharyngeal swab specimens and should not be used as a sole basis for treatment. Nasal washings and aspirates are unacceptable for Xpert Xpress SARS-CoV-2/FLU/RSV testing.  Fact Sheet for Patients: EntrepreneurPulse.com.au  Fact Sheet for Healthcare Providers: IncredibleEmployment.be  This test is not yet approved or cleared by the [place] FDA and has been authorized for detection and/or diagnosis of SARS-CoV-2 by FDA under an Emergency Use Authorization (EUA). This EUA will remain in effect (meaning this test can be used) for the duration of the COVID-19 declaration under Section 564(b)(1) of the Act, 21 U.S.C. section 360bbb-3(b)(1), unless the authorization is terminated or revoked.  Performed at [facility], [place] [address] [place], [place] [address]   Urine culture     Status: Abnormal   Collection Time: 08/14/20  9:11 PM   Specimen: Urine, Clean Catch  Result Value Ref Range Status   Specimen Description URINE, CLEAN CATCH  Final   Special Requests NONE  Final   Culture (A)  Final    <10,000 COLONIES/mL INSIGNIFICANT GROWTH Performed at [place], [place] [address], [place], [place] [address]    Report Status 08/16/2020 FINAL  Final      Studies: No results found.   Scheduled Meds: . carvedilol  3.125 mg Oral BID WC  . cholecalciferol  4,000 Units Oral Daily  . ferrous sulfate  325 mg Oral Q breakfast  . furosemide  40 mg Oral BID  . levothyroxine  100 mcg Oral QAC breakfast  . multivitamin with minerals  1 tablet Oral Daily  . potassium chloride SA  40  mEq Oral BID  . pravastatin  20 mg Oral Daily  . sertraline  100 mg Oral Daily  . warfarin  2 mg Oral ONCE-1600  . Warfarin - Pharmacist Dosing Inpatient   Does not apply q1600   Continuous Infusions: .  ceFAZolin (ANCEF) IV 2 g (08/17/20 1429)    Active Problems:   Sepsis ([place])     Everlynn Sagun Derek Jack, [facility]  If 7PM-7AM, please contact night-coverage [email or web address]   LOS: 3 days

## 2020-08-17 NOTE — Progress Notes (Signed)
[place] for warfarin Indication: atrial fibrillation/mechanical AVR  Allergies  Allergen Reactions  . Codeine Anaphylaxis    Jerking, involuntary jerking   . Metoprolol     depression   . Propoxyphene Other (See Comments)    Bad vivid dreams  . Dilantin [Phenytoin Sodium Extended] Rash and Itching    Patient Measurements: Height: 5\' 7"  (170.2 cm) Weight: 99.1 kg (218 lb 7.6 oz) IBW/kg (Calculated) : 61.6  Vital Signs: Temp: 98.1 F (36.7 C) (05/11 0741) Temp Source: Oral (05/11 0741) BP: 110/66 (05/11 0741) Pulse Rate: 67 (05/11 0814)  Labs: Recent Labs    08/14/20 1730 08/14/20 1736 08/14/20 1814 08/14/20 2349 08/15/20 0032 08/15/20 0553 08/15/20 0830 08/16/20 0045 08/17/20 0015  HGB 12.4   < >  --   --   --  10.2*  --  10.6* 9.8*  HCT 37.2   < >  --   --   --  30.8*  --  32.8* 30.6*  PLT 236  --   --   --   --  190  --  186 202  APTT 47*  --   --   --   --   --   --   --   --   LABPROT 25.8*  --   --   --   --   --  30.4* 31.0* 29.2*  INR 2.4*  --   --   --   --   --  2.9* 3.0* 2.8*  CREATININE 1.02*  --   --   --   --   --  0.90 0.81 0.86  TROPONINIHS 36*  --  42* 36* 34*  --   --   --   --    < > = values in this interval not displayed.    Estimated Creatinine Clearance: 68.3 mL/min (by C-G formula based on SCr of 0.86 mg/dL).   Medical History: Past Medical History:  Diagnosis Date  . Anxiety   . Aortic stenosis    status post aortic valve replacement with St. Jude mechanical prosthesis  . 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   23mm by echo 06/2016 and 65mm by CT angin 2016  . Chronic anticoagulation   . 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 Depression   . DJD (degenerative joint disease) of knee   . Dyslipidemia   . GERD (gastroesophageal reflux disease)    "several years ago; none since" (11/12/2012)  . Hyperlipidemia   . Hyperlipidemia LDL goal <70 01/19/2017   . Hypertension   . Left atrial enlargement   . Mitral regurgitation 06/25/2016   Mild moderate MR by echo 06/2016  . Obesity   . Persistent atrial fibrillation (HCC)    s/p afib ablation x 2 at [facility]  . Sleep apnea    On CPAP at 12cm H2O  . 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 - now cleared by neurosurgery to maintain INR 2-2.5     Assessment: 57 yoF with hx mAVR and AFib admitted with sepsis. INR goal 2-2.5 per clinic notes (due to history of subdural hematoma) -INR down this morning but still slightly above goal at 2.8.  Will give one more day of reduced dose warfarin and hope to change back closer to home dose tomorrow if INR closer to goal. Hgb stable 9-10s. *Home dose 8mg  MWF, 4mg  AODs  Goal of Therapy:  INR 2-2.5 Monitor platelets by anticoagulation protocol: Yes   Plan:  -Reduce today's coumadin dose to 2mg   -Daily PT/INR  Erin Hearing PharmD., BCPS Clinical Pharmacist 08/17/2020 10:26 AM

## 2020-08-17 NOTE — Progress Notes (Signed)
Patient had home CPAP brought in. Helped patient setup machine and now she is own for the night.

## 2020-08-17 NOTE — Progress Notes (Signed)
Physical Therapy Treatment Patient Details Name: [name] MRN: [id number] DOB: June 17, 1945 Today's Date: 08/17/2020    History of Present Illness Patient is a 75 yo female admitted 5/8 with RUE pain, edema, weakness and SOB. Pt with sepsis due to RUE cellulitis. PMHx: AFib, CHF, AVR, MVR, SDH, Asthma, pulmonary HTN, HLD, Lt TKA.    PT Comments    Pt received in chair, agreeable to therapy session and with good tolerance for gait progression. Pt scored 17/24 indicating increased fall risk per guideline scores of 19 or less are predictive of falls in older community living adults, of note pt refused to attempt stair portion of DGI due to typical difficulty with this task at baseline. Emphasis on safety with transfers and gait progression, activity pacing, use of DME as needed for endurance building and falls prevention and exercises for strengthening/endurance building. Pt continues to benefit from PT services to progress toward functional mobility goals. Discharge recommendations below remain appropriate.  Follow Up Recommendations  No PT follow up;Supervision - Intermittent     Equipment Recommendations  None recommended by PT    Recommendations for Other Services       Precautions / Restrictions Precautions Precautions: Fall Precaution Comments: Keep MAP >65 Restrictions Weight Bearing Restrictions: No    Mobility  Bed Mobility               General bed mobility comments: received in chair    Transfers Overall transfer level: Needs assistance Equipment used: None Transfers: Sit to/from Stand Sit to Stand: Modified independent (Device/Increase time)         General transfer comment: from recliner/to recliner  Ambulation/Gait Ambulation/Gait assistance: Supervision Gait Distance (Feet): 400 Feet Assistive device: Straight cane Gait Pattern/deviations: Step-through pattern;Decreased stride length Gait velocity: decreased Gait velocity interpretation:  1.31 - 2.62 ft/sec, indicative of limited community ambulator General Gait Details: DOE 2/4, 2 standing breaks; no LOB using cane, does not rely on cane and fairly steady   Stairs         General stair comments: offered to review sequencing with pt as part of DGI but pt defers, she has level entry   Wheelchair Mobility    Modified Rankin (Stroke Patients Only)       Balance Overall balance assessment: Mild deficits observed, not formally tested                               Standardized Balance Assessment Standardized Balance Assessment : Dynamic Gait Index   Dynamic Gait Index Level Surface: Mild Impairment Change in Gait Speed: Normal Gait with Horizontal Head Turns: Normal Gait with Vertical Head Turns: Normal Gait and Pivot Turn: Mild Impairment Step Over Obstacle: Mild Impairment Step Around Obstacles: Mild Impairment Steps: Severe Impairment Total Score: 17      Cognition Arousal/Alertness: Awake/alert Behavior During Therapy: WFL for tasks assessed/performed Overall Cognitive Status: Within Functional Limits for tasks assessed                                 General Comments: pleasant, cooperative, good safety awareness      Exercises Other Exercises Other Exercises: pt given supine/standing LE HEP and UE HEP for RUE strengthening, will plan to review HEP handout next session.    General Comments General comments (skin integrity, edema, etc.): SpO2 94-98% on RA with exertion, HR 66-80's bpm; BP 123/73 (89)  pre-mobility seated and BP 135/66 (86) post-exertion seated, no dizziness reported      Pertinent Vitals/Pain Pain Assessment: Faces Faces Pain Scale: Hurts a little bit Pain Location: RUE Pain Descriptors / Indicators: Sore Pain Intervention(s): Monitored during session;Repositioned    Home Living                      Prior Function            PT Goals (current goals can now be found in the care plan  section) Acute Rehab PT Goals Patient Stated Goal: to feel better and go home PT Goal Formulation: With patient Time For Goal Achievement: 08/29/20 Potential to Achieve Goals: Good Progress towards PT goals: Progressing toward goals    Frequency    Min 3X/week      PT Plan Current plan remains appropriate    Co-evaluation              AM-PAC PT "6 Clicks" Mobility   Outcome Measure  Help needed turning from your back to your side while in a flat bed without using bedrails?: None Help needed moving from lying on your back to sitting on the side of a flat bed without using bedrails?: None Help needed moving to and from a bed to a chair (including a wheelchair)?: None Help needed standing up from a chair using your arms (e.g., wheelchair or bedside chair)?: None Help needed to walk in hospital room?: A Little Help needed climbing 3-5 steps with a railing? : A Lot 6 Click Score: 21    End of Session   Activity Tolerance: Patient tolerated treatment well Patient left: in chair;with call bell/phone within reach;Other (comment) (dinner tray arrived, RN notified pt without chair alarm but per RN OK to leave alarm off, she has been good to call for assist) Nurse Communication: Mobility status PT Visit Diagnosis: Pain;Muscle weakness (generalized) (M62.81);Unsteadiness on feet (R26.81) Pain - Right/Left: Right Pain - part of body: Arm     Time: 4782-9562 PT Time Calculation (min) (ACUTE ONLY): 20 min  Charges:  $Gait Training: 8-22 mins                     Taleigh Gero P., PTA Acute Rehabilitation Services Pager: [phone] Office: [place] 08/17/2020, 5:14 PM

## 2020-08-18 LAB — CBC WITH DIFFERENTIAL/PLATELET
Abs Immature Granulocytes: 0.01 10*3/uL (ref 0.00–0.07)
Basophils Absolute: 0 10*3/uL (ref 0.0–0.1)
Basophils Relative: 1 %
Eosinophils Absolute: 0.4 10*3/uL (ref 0.0–0.5)
Eosinophils Relative: 7 %
HCT: 33 % — ABNORMAL LOW (ref 36.0–46.0)
Hemoglobin: 10.7 g/dL — ABNORMAL LOW (ref 12.0–15.0)
Immature Granulocytes: 0 %
Lymphocytes Relative: 25 %
Lymphs Abs: 1.2 10*3/uL (ref 0.7–4.0)
MCH: 31.5 pg (ref 26.0–34.0)
MCHC: 32.4 g/dL (ref 30.0–36.0)
MCV: 97.1 fL (ref 80.0–100.0)
Monocytes Absolute: 0.6 10*3/uL (ref 0.1–1.0)
Monocytes Relative: 13 %
Neutro Abs: 2.5 10*3/uL (ref 1.7–7.7)
Neutrophils Relative %: 54 %
Platelets: 207 10*3/uL (ref 150–400)
RBC: 3.4 MIL/uL — ABNORMAL LOW (ref 3.87–5.11)
RDW: 12.7 % (ref 11.5–15.5)
WBC: 4.8 10*3/uL (ref 4.0–10.5)
nRBC: 0 % (ref 0.0–0.2)

## 2020-08-18 LAB — BASIC METABOLIC PANEL
Anion gap: 8 (ref 5–15)
BUN: 10 mg/dL (ref 8–23)
CO2: 27 mmol/L (ref 22–32)
Calcium: 9 mg/dL (ref 8.9–10.3)
Chloride: 107 mmol/L (ref 98–111)
Creatinine, Ser: 0.71 mg/dL (ref 0.44–1.00)
GFR, Estimated: >60 mL/min (ref >60)
Glucose, Bld: 91 mg/dL (ref 70–99)
Potassium: 3.8 mmol/L (ref 3.5–5.1)
Sodium: 142 mmol/L (ref 135–145)

## 2020-08-18 LAB — PROTIME-INR
INR: 2.4 — ABNORMAL HIGH (ref 0.8–1.2)
Prothrombin Time: 26.5 seconds — ABNORMAL HIGH (ref 11.4–15.2)

## 2020-08-18 MED ORDER — ENTRESTO 24-26 MG PO TABS: 1.0000 | ORAL_TABLET | Freq: Two times a day (BID) | ORAL | 0 refills | Status: DC

## 2020-08-18 MED ORDER — CEFADROXIL 500 MG PO CAPS: 500.0000 mg | ORAL_CAPSULE | Freq: Two times a day (BID) | ORAL | 0 refills | Status: AC

## 2020-08-18 NOTE — Discharge Summary (Signed)
Physician Discharge Summary  [name] [id number] DOB: 30-Jun-1945 DOA: 08/14/2020  PCP: Maurice Small, MD  Admit date: 08/14/2020 Discharge date: 08/18/2020  Admitted From: Home Disposition: Home  Recommendations for Outpatient Follow-up:  1. Follow up with PCP in 1-2 weeks 2. Follow-up with infectious disease 3. Please obtain BMP/CBC in one week 4. Please follow up on the following pending results: None  Home Health: No Equipment/Devices: None Discharge Condition: Stable CODE STATUS: Full Diet recommendation: Heart Healthy   Brief/Interim Summary: 75 y.o.femalewith medical history significant forpermanent A. fib, history of ablation x2, on Coumadin, chronic combined systolic and diastolic CHF,aortic mechanical valve on Coumadin, moderate to severemitral valve regurgitation,subdural hematoma in the setting of INR greater than 2.2 shortly after AVR, nowcleared by neurosurgery to maintain INR between 2-2.5,OSA on CPAP, asthma, pulmonary hypertension, who presented to [facility] ED from home due tosudden onset shaking chills, generalized weakness, right forearm pain with swelling and erythema,shortness of breathworse with exertion, overall not feeling well. In the ED, febrile to 103.3 F, code sepsis initiated and admitted for sepsis secondary to right forearm cellulitis.  Blood cultures remain negative.  Symptoms of cellulitis resolved.  She was initially treated with broad-spectrum antibiotics which were later narrowed to cefazolin and she was discharged on 7 more days of cefadroxil by ID.  Patient did had mildly elevated troponin, no chest pain, most likely secondary to demand ischemia. Patient has an history of rate controlled atrial fibrillation, HFrEF s/p AVR/MR. Cardiology was consulted and she will continue with current management.  Patient had a subdural hematoma with mildly elevated INR, neurosurgery cleared her to continue with anticoagulation and maintain INR between  2-2.5.  Patient will continue rest of her home medications and follow-up with her providers.  Discharge Diagnoses:  Active Problems:   Sepsis [facility])   Discharge Instructions  Discharge Instructions    Diet - low sodium heart healthy   Complete by: As directed    Discharge instructions   Complete by: As directed    It was pleasure taking care of you. You are being given antibiotics for 7 more days, please take it as directed and follow-up with infectious disease. Continue taking rest of your medications and keep yourself well-hydrated. Follow-up with your primary care provider and Coumadin clinic.   Increase activity slowly   Complete by: As directed      Allergies as of 08/18/2020      Reactions   Codeine Anaphylaxis   Jerking, involuntary jerking   Metoprolol    depression   Propoxyphene Other (See Comments)   Bad vivid dreams   Dilantin [phenytoin Sodium Extended] Rash, Itching      Medication List    STOP taking these medications   amoxicillin 500 MG capsule Commonly known as: AMOXIL     TAKE these medications   acetaminophen 500 MG tablet Commonly known as: TYLENOL Take 1,000 mg by mouth daily as needed for mild pain.   ALPRAZolam 0.5 MG tablet Commonly known as: XANAX Take 0.5 mg by mouth 2 (two) times daily as needed for anxiety.   calcium-vitamin D 500-200 MG-UNIT tablet Commonly known as: OSCAL WITH D Take 2 tablets by mouth daily.   carvedilol 3.125 MG tablet Commonly known as: COREG TAKE 1 TABLET(3.125 MG) BY MOUTH TWICE DAILY WITH A MEAL What changed: See the new instructions.   cefadroxil 500 MG capsule Commonly known as: DURICEF Take 1 capsule (500 mg total) by mouth 2 (two) times daily for 7 days.   famotidine  10 MG tablet Commonly known as: PEPCID Take 10 mg by mouth 2 (two) times daily as needed for heartburn or indigestion.   ferrous sulfate 325 (65 FE) MG tablet Take 325 mg by mouth daily with breakfast.   furosemide 40 MG  tablet Commonly known as: LASIX Take 1 tablet (40 mg total) by mouth 2 (two) times daily. For weights above 212 pounds, take 80mg  in the morning and 40mg  in the evening.   levothyroxine 100 MCG tablet Commonly known as: SYNTHROID Take 100 mcg daily before breakfast by mouth.   multivitamin with minerals Tabs tablet Take 1 tablet by mouth daily.   oxymetazoline 0.05 % nasal spray Commonly known as: AFRIN Place 1 spray into both nostrils daily as needed for congestion.   potassium chloride SA 20 MEQ tablet Commonly known as: KLOR-CON TAKE 2 TABLETS(40 MEQ) BY MOUTH DAILY What changed:   how much to take  how to take this  when to take this  additional instructions   pravastatin 20 MG tablet Commonly known as: PRAVACHOL TAKE 1 TABLET BY MOUTH EVERY DAY   sacubitril-valsartan 97-103 MG Commonly known as: ENTRESTO Take 1 tablet by mouth 2 (two) times daily.   sertraline 100 MG tablet Commonly known as: ZOLOFT Take 100 mg by mouth daily.   traMADol 50 MG tablet Commonly known as: ULTRAM Take 50 mg by mouth every 12 (twelve) hours as needed for severe pain.   tretinoin 0.05 % cream Commonly known as: RETIN-A Apply 1 application topically at bedtime.   Vitamin D 50 MCG (2000 UT) tablet Take 4,000 Units by mouth daily.   warfarin 4 MG tablet Commonly known as: COUMADIN Take as directed. If you are unsure how to take this medication, talk to your nurse or doctor. Original instructions: TAKE 1 to 2 TABLETs  Daily as directed by the coumadin clinic What changed:   how much to take  how to take this  when to take this  additional instructions   zolpidem 10 MG tablet Commonly known as: AMBIEN Take 5 mg by mouth at bedtime as needed for sleep.       Follow-up Information    Sherren Mocha, MD Follow up on 09/14/2020.   Specialty: Cardiology Why: @3 :40PM. Contact information: [address] [place] [address] [phone]         Sanda Klein, MD Follow up on 10/26/2020.   Specialty: Cardiology Why: 9:40AM.  Contact information: [address] [place] [address] [phone]        Maurice Small, MD.   Specialty: Family Medicine Contact information: [address] [place] [address] [phone]              Allergies  Allergen Reactions  . Codeine Anaphylaxis    Jerking, involuntary jerking   . Metoprolol     depression   . Propoxyphene Other (See Comments)    Bad vivid dreams  . Dilantin [Phenytoin Sodium Extended] Rash and Itching    Consultations:  Infectious disease  Cardiology  Procedures/Studies: DG Chest 2 View  Result Date: 08/14/2020 CLINICAL DATA:  Shortness of breath. EXAM: CHEST - 2 VIEW COMPARISON:  Chest radiograph dated 09/24/2019. FINDINGS: The heart is enlarged. Vascular calcifications are seen in the aortic arch. Median sternotomy wires and an aortic valve prosthesis are noted. Mild bilateral lower lung atelectasis/airspace disease. There is no pleural effusion or pneumothorax. Degenerative changes are seen in the spine. IMPRESSION: Mild bilateral lower lung atelectasis/airspace  disease. Cardiomegaly. Aortic Atherosclerosis (ICD10-I70.0). Electronically Signed   By: Zerita Boers M.D.   On: 08/14/2020 17:25   VAS [place] UPPER EXTREMITY VENOUS DUPLEX  Result Date: 08/15/2020 UPPER VENOUS STUDY  Patient Name:  [name]  Date of Exam:   08/15/2020 Medical Rec #: [id number]            Accession #:    [id number] Date of Birth: November 29, 1945             Patient Gender: F Patient Age:   27Y Exam Location:  [facility] ([place]) Procedure:      VAS [place] UPPER EXTREMITY VENOUS DUPLEX Referring Phys: [id number] ANAND D HONGALGI --------------------------------------------------------------------------------  Indications: Edema, and Erythema Risk Factors: Sepsis, INR 2.4. Anticoagulation: Coumadin. Comparison Study: No prior study on file Performing  Technologist: Sharion Dove RVS  Examination Guidelines: A complete evaluation includes B-mode imaging, spectral Doppler, color Doppler, and power Doppler as needed of all accessible portions of each vessel. Bilateral testing is considered an integral part of a complete examination. Limited examinations for reoccurring indications may be performed as noted.  Right Findings: +----------+------------+---------+-----------+----------+---------------------+ RIGHT     CompressiblePhasicitySpontaneousProperties       Summary        +----------+------------+---------+-----------+----------+---------------------+ IJV           Full       Yes       Yes                                    +----------+------------+---------+-----------+----------+---------------------+ Subclavian               Yes       Yes                                    +----------+------------+---------+-----------+----------+---------------------+ Axillary                 Yes       Yes                                    +----------+------------+---------+-----------+----------+---------------------+ Brachial      Full       Yes       Yes                                    +----------+------------+---------+-----------+----------+---------------------+ Radial        Full                                                        +----------+------------+---------+-----------+----------+---------------------+ Ulnar                                               patent with color and  Doppler        +----------+------------+---------+-----------+----------+---------------------+ Cephalic      Full                                                        +----------+------------+---------+-----------+----------+---------------------+ Basilic       Full                                                         +----------+------------+---------+-----------+----------+---------------------+  Left Findings: +----------+------------+---------+-----------+----------+-------+ LEFT      CompressiblePhasicitySpontaneousPropertiesSummary +----------+------------+---------+-----------+----------+-------+ Subclavian               Yes       Yes                      +----------+------------+---------+-----------+----------+-------+  Summary:  Right: No evidence of deep vein thrombosis in the upper extremity. No evidence of superficial vein thrombosis in the upper extremity.  Left: No evidence of superficial vein thrombosis in the upper extremity.  *See table(s) above for measurements and observations.  Diagnosing physician: Servando Snare MD Electronically signed by Servando Snare MD on 08/15/2020 at 2:58:56 PM.    Final    ECHOCARDIOGRAM LIMITED  Result Date: 08/15/2020    ECHOCARDIOGRAM LIMITED REPORT   Patient Name:   [name] Date of Exam: 08/15/2020 Medical Rec #:  [id number]           Height:       67.0 in Accession #:    [id number]          Weight:       218.0 lb Date of Birth:  Dec 03, 1945            BSA:          2.098 m Patient Age:    30 years            BP:           121/53 mmHg Patient Gender: F                   HR:           71 bpm. Exam Location:  Inpatient Procedure: Limited Echo, Cardiac Doppler and Limited Color Doppler Indications:    I34.0 Nonrheumatic mitral (valve) insufficiency; S/P Aortic                 Valve Replacement Z95.2  History:        Patient has prior history of Echocardiogram examinations, most                 recent 07/12/2020. Aortic Valve Disease, Arrythmias:Atrial                 Fibrillation; Risk Factors:Hypertension, Dyslipidemia, Sleep                 Apnea and GERD.                 Aortic Valve: 21 mm St. Jude mechanical valve is present in the                 aortic position. Procedure Date: 2006.  Sonographer:    Jonelle Sidle Dance Referring Phys: [place]  1. Poor acoustic windows limit study.  2. Septal flattening consistent with RV volume overload. . Left ventricular ejection fraction, by estimation, is 55%%. The left ventricle has normal function. The left ventricular internal cavity size was moderately to severely dilated. There is mild left ventricular hypertrophy.  3. Right ventricular systolic function is moderately reduced. The right ventricular size is mildly enlarged. There is moderately elevated pulmonary artery systolic pressure.  4. Left atrial size was moderately dilated.  5. Right atrial size was severely dilated.  6. The mitral valve is midly thickened but does not appear restricted in motion. MR jet is central. Suspect MR due to annular dilitation. . Moderate to severe mitral valve regurgitation.  7. 21 mm St Jude Reagent mechanical valve present (procedure date 2006) Not well visualized. Peak and mean gradients through the valve are 35 and 20 mm Hg respectively. Dimensionless index is 0.3. Compared to report from October 2021, gradients are increased but dimensionless index is relaively unchanged. .  8. Aortic dilatation noted. There is mild dilatation of the aortic root, measuring 39 mm.  9. The inferior vena cava is dilated in size with <50% respiratory variability, suggesting right atrial pressure of 15 mmHg. FINDINGS  Left Ventricle: Septal flattening consistent with RV volume overload. Left ventricular ejection fraction, by estimation, is 55%%. The left ventricle has normal function. The left ventricular internal cavity size was moderately to severely dilated. There  is mild left ventricular hypertrophy. Right Ventricle: The right ventricular size is mildly enlarged. Right ventricular systolic function is moderately reduced. There is moderately elevated pulmonary artery systolic pressure. The tricuspid regurgitant velocity is 3.58 m/s, and with an assumed right atrial pressure of 8 mmHg, the estimated right ventricular systolic  pressure is [id number] mmHg. Left Atrium: Left atrial size was moderately dilated. Right Atrium: Right atrial size was severely dilated. Pericardium: There is no evidence of pericardial effusion. Mitral Valve: The mitral valve is midly thickened but does not appear restricted in motion. MR jet is central. Suspect MR due to annular dilitation. There is mild thickening of the mitral valve leaflet(s). Mild mitral annular calcification. Moderate to severe mitral valve regurgitation. Aortic Valve: 21 mm St Jude Reagent mechanical valve present (procedure date 2006) Not well visualized. Peak and mean gradients through the valve are 35 and 20 mm Hg respectively. Dimensionless index is 0.3. Compared to report from October 2021, gradients are increased but dimensionless index is relatively unchanged. The aortic valve has been repaired/replaced. Aortic valve mean gradient measures 19.8 mmHg. Aortic valve peak gradient measures 33.0 mmHg. Aortic valve area, by VTI measures 0.86 cm. There is a 21 mm St. Jude mechanical valve present in the aortic position. Procedure Date: 2006. Pulmonic Valve: The pulmonic valve was normal in structure. Pulmonic valve regurgitation is trivial. Aorta: Aortic dilatation noted. There is mild dilatation of the aortic root, measuring 39 mm. Venous: The inferior vena cava is dilated in size with less than 50% respiratory variability, suggesting right atrial pressure of 15 mmHg. LEFT VENTRICLE PLAX 2D LVIDd:         5.90 cm LVIDs:         3.40 cm LV PW:         1.10 cm LV IVS:        1.20 cm LVOT diam:     1.90 cm LV SV:         56 LV SV Index:  27 LVOT Area:     2.84 cm  IVC IVC diam: 2.80 cm LEFT ATRIUM         Index LA diam:    5.20 cm 2.48 cm/m  AORTIC VALVE AV Area (Vmax):    0.81 cm AV Area (Vmean):   0.80 cm AV Area (VTI):     0.86 cm AV Vmax:           287.25 cm/s AV Vmean:          212.500 cm/s AV VTI:            0.650 m AV Peak Grad:      33.0 mmHg AV Mean Grad:      19.8 mmHg LVOT Vmax:          81.60 cm/s LVOT Vmean:        60.050 cm/s LVOT VTI:          0.198 m LVOT/AV VTI ratio: 0.30  AORTA Ao Root diam: 3.90 cm Ao Asc diam:  4.10 cm MITRAL VALVE                 TRICUSPID VALVE MV Area (PHT): 3.03 cm      TR Peak grad:   51.3 mmHg MV Decel Time: 250 msec      TR Vmax:        358.00 cm/s MR Peak grad:    124.1 mmHg MR Mean grad:    80.5 mmHg   SHUNTS MR Vmax:         557.00 cm/s Systemic VTI:  0.20 m MR Vmean:        423.0 cm/s  Systemic Diam: 1.90 cm MR PISA:         2.26 cm MR PISA Eff ROA: 16 mm MR PISA Radius:  0.60 cm MV E velocity: 148.00 cm/s MV A velocity: 80.20 cm/s MV E/A ratio:  1.85 Dorris Carnes MD Electronically signed by Dorris Carnes MD Signature Date/Time: 08/15/2020/6:42:18 PM    Final      Subjective: Patient was seen and examined today.  No new complaint.  Right forearm edema and erythema has been resolved.  She wants to go home.  Discharge Exam: Vitals:   08/18/20 0046 08/18/20 0351  BP:  118/74  Pulse: 68 63  Resp: 17 16  Temp:  97.8 F (36.6 C)  SpO2: 98% 100%   Vitals:   08/17/20 1907 08/17/20 2035 08/18/20 0046 08/18/20 0351  BP: (!) 145/63   118/74  Pulse: 67 71 68 63  Resp: 17 18 17 16   Temp: 98.4 F (36.9 C)   97.8 F (36.6 C)  TempSrc: Oral   Oral  SpO2: 98% 96% 98% 100%  Weight:    97.6 kg  Height:        General: Pt is alert, awake, not in acute distress Cardiovascular: RRR, S1/S2 +, no rubs, no gallops Respiratory: CTA bilaterally, no wheezing, no rhonchi Abdominal: Soft, NT, ND, bowel sounds + Extremities: no edema, no cyanosis   The results of significant diagnostics from this hospitalization (including imaging, microbiology, ancillary and laboratory) are listed below for reference.    Microbiology: Recent Results (from the past 240 hour(s))  MRSA PCR Screening     Status: None   Collection Time: 08/14/20 12:31 AM   Specimen: Nasopharyngeal  Result Value Ref Range Status   MRSA by PCR NEGATIVE NEGATIVE Final    Comment:         The GeneXpert MRSA Assay (FDA  approved for NASAL specimens only), is one component of a comprehensive MRSA colonization surveillance program. It is not intended to diagnose MRSA infection nor to guide or monitor treatment for MRSA infections. Performed at [facility], [place] [address], [place], [place] [address]   Blood culture (routine x 2)     Status: None (Preliminary result)   Collection Time: 08/14/20  5:50 PM   Specimen: BLOOD LEFT FOREARM  Result Value Ref Range Status   Specimen Description BLOOD LEFT FOREARM  Final   Special Requests   Final    BOTTLES DRAWN AEROBIC AND ANAEROBIC Blood Culture adequate volume   Culture   Final    NO GROWTH 4 DAYS Performed at [facility], [place] [address], [place], [place] [address]    Report Status PENDING  Incomplete  Blood culture (routine x 2)     Status: None (Preliminary result)   Collection Time: 08/14/20  6:16 PM   Specimen: BLOOD  Result Value Ref Range Status   Specimen Description BLOOD RIGHT ANTECUBITAL  Final   Special Requests   Final    BOTTLES DRAWN AEROBIC AND ANAEROBIC Blood Culture adequate volume   Culture   Final    NO GROWTH 4 DAYS Performed at [facility], [place] [address], [place], [place] [address]    Report Status PENDING  Incomplete  Resp Panel by RT-PCR (Flu A&B, Covid) Nasopharyngeal Swab     Status: None   Collection Time: 08/14/20  6:20 PM   Specimen: Nasopharyngeal Swab; Nasopharyngeal(NP) swabs in vial transport medium  Result Value Ref Range Status   SARS Coronavirus 2 by RT PCR NEGATIVE NEGATIVE Final    Comment: (NOTE) SARS-CoV-2 target nucleic acids are NOT DETECTED.  The SARS-CoV-2 RNA is generally detectable in upper respiratory specimens during the acute phase of infection. The lowest concentration of SARS-CoV-2 viral copies this assay can detect is 138 copies/mL. A negative result does not preclude SARS-Cov-2 infection and should not be used as the sole basis for  treatment or other patient management decisions. A negative result may occur with  improper specimen collection/handling, submission of specimen other than nasopharyngeal swab, presence of viral mutation(s) within the areas targeted by this assay, and inadequate number of viral copies(<138 copies/mL). A negative result must be combined with clinical observations, patient history, and epidemiological information. The expected result is Negative.  Fact Sheet for Patients:  EntrepreneurPulse.com.au  Fact Sheet for Healthcare Providers:  IncredibleEmployment.be  This test is no t yet approved or cleared by the [place] FDA and  has been authorized for detection and/or diagnosis of SARS-CoV-2 by FDA under an Emergency Use Authorization (EUA). This EUA will remain  in effect (meaning this test can be used) for the duration of the COVID-19 declaration under Section 564(b)(1) of the Act, 21 U.S.C.section 360bbb-3(b)(1), unless the authorization is terminated  or revoked sooner.       Influenza A by PCR NEGATIVE NEGATIVE Final   Influenza B by PCR NEGATIVE NEGATIVE Final    Comment: (NOTE) The Xpert Xpress SARS-CoV-2/FLU/RSV plus assay is intended as an aid in the diagnosis of influenza from Nasopharyngeal swab specimens and should not be used as a sole basis for treatment. Nasal washings and aspirates are unacceptable for Xpert Xpress SARS-CoV-2/FLU/RSV testing.  Fact Sheet for Patients: EntrepreneurPulse.com.au  Fact Sheet for Healthcare Providers: IncredibleEmployment.be  This test is not yet approved or cleared by the [place] FDA and has been authorized for detection and/or  diagnosis of SARS-CoV-2 by FDA under an Emergency Use Authorization (EUA). This EUA will remain in effect (meaning this test can be used) for the duration of the COVID-19 declaration under Section 564(b)(1) of the Act, 21  U.S.C. section 360bbb-3(b)(1), unless the authorization is terminated or revoked.  Performed at [facility], [place] [address] [place], [place] [address]   Urine culture     Status: Abnormal   Collection Time: 08/14/20  9:11 PM   Specimen: Urine, Clean Catch  Result Value Ref Range Status   Specimen Description URINE, CLEAN CATCH  Final   Special Requests NONE  Final   Culture (A)  Final    <10,000 COLONIES/mL INSIGNIFICANT GROWTH Performed at [facility], [place] [address] [place], [place] [address]    Report Status 08/16/2020 FINAL  Final     Labs: BNP (last 3 results) Recent Labs    09/24/19 1742 08/15/20 0032  BNP 696.5* 169.6*   Basic Metabolic Panel: Recent Labs  Lab 08/14/20 1730 08/14/20 1736 08/15/20 0830 08/16/20 0045 08/17/20 0015 08/18/20 0338  NA 138 140 138 140 139 142  K 4.2 4.1 3.2* 3.6 3.8 3.8  CL 106  --  108 108 108 107  CO2 23  --  24 25 22 27   GLUCOSE 121*  --  152* 100* 96 91  BUN 28*  --  20 13 12 10   CREATININE 1.02*  --  0.90 0.81 0.86 0.71  CALCIUM 9.4  --  8.3* 8.3* 8.2* 9.0  MG  --   --  2.1  --   --   --   PHOS  --   --  2.6  --   --   --    Liver Function Tests: Recent Labs  Lab 08/14/20 1730  AST 26  ALT 19  ALKPHOS 63  BILITOT 0.9  PROT 7.4  ALBUMIN 3.8   No results for input(s): LIPASE, AMYLASE in the last 168 hours. No results for input(s): AMMONIA in the last 168 hours. CBC: Recent Labs  Lab 08/14/20 1730 08/14/20 1736 08/15/20 0553 08/16/20 0045 08/17/20 0015 08/18/20 0338  WBC 16.0*  --  10.2 9.2 7.2 4.8  NEUTROABS 13.8*  --   --   --   --  2.5  HGB 12.4 12.2 10.2* 10.6* 9.8* 10.7*  HCT 37.2 36.0 30.8* 32.8* 30.6* 33.0*  MCV 96.4  --  97.8 97.9 98.7 97.1  PLT 236  --  190 186 202 207   Cardiac Enzymes: No results for input(s): CKTOTAL, CKMB, CKMBINDEX, TROPONINI in the last 168 hours. BNP: Invalid input(s): POCBNP CBG: Recent Labs  Lab 08/15/20 1553  GLUCAP 98   D-Dimer No results  for input(s): DDIMER in the last 72 hours. Hgb A1c No results for input(s): HGBA1C in the last 72 hours. Lipid Profile No results for input(s): CHOL, HDL, LDLCALC, TRIG, CHOLHDL, LDLDIRECT in the last 72 hours. Thyroid function studies No results for input(s): TSH, T4TOTAL, T3FREE, THYROIDAB in the last 72 hours.  Invalid input(s): FREET3 Anemia work up No results for input(s): VITAMINB12, FOLATE, FERRITIN, TIBC, IRON, RETICCTPCT in the last 72 hours. Urinalysis    Component Value Date/Time   COLORURINE YELLOW 08/14/2020 2111   APPEARANCEUR CLEAR 08/14/2020 2111   LABSPEC 1.014 08/14/2020 2111   PHURINE 5.0 08/14/2020 2111   GLUCOSEU NEGATIVE 08/14/2020 2111   HGBUR SMALL (A) 08/14/2020 2111   BILIRUBINUR NEGATIVE 08/14/2020 2111   KETONESUR NEGATIVE 08/14/2020 2111   PROTEINUR  NEGATIVE 08/14/2020 2111   NITRITE NEGATIVE 08/14/2020 2111   LEUKOCYTESUR NEGATIVE 08/14/2020 2111   Sepsis Labs Invalid input(s): PROCALCITONIN,  WBC,  LACTICIDVEN Microbiology Recent Results (from the past 240 hour(s))  MRSA PCR Screening     Status: None   Collection Time: 08/14/20 12:31 AM   Specimen: Nasopharyngeal  Result Value Ref Range Status   MRSA by PCR NEGATIVE NEGATIVE Final    Comment:        The GeneXpert MRSA Assay (FDA approved for NASAL specimens only), is one component of a comprehensive MRSA colonization surveillance program. It is not intended to diagnose MRSA infection nor to guide or monitor treatment for MRSA infections. Performed at [facility], [place] [address], [place], [place] [address]   Blood culture (routine x 2)     Status: None (Preliminary result)   Collection Time: 08/14/20  5:50 PM   Specimen: BLOOD LEFT FOREARM  Result Value Ref Range Status   Specimen Description BLOOD LEFT FOREARM  Final   Special Requests   Final    BOTTLES DRAWN AEROBIC AND ANAEROBIC Blood Culture adequate volume   Culture   Final    NO GROWTH 4 DAYS Performed at [facility], [place] [address], [place], [place] [address]    Report Status PENDING  Incomplete  Blood culture (routine x 2)     Status: None (Preliminary result)   Collection Time: 08/14/20  6:16 PM   Specimen: BLOOD  Result Value Ref Range Status   Specimen Description BLOOD RIGHT ANTECUBITAL  Final   Special Requests   Final    BOTTLES DRAWN AEROBIC AND ANAEROBIC Blood Culture adequate volume   Culture   Final    NO GROWTH 4 DAYS Performed at [facility], [place] [address] [place], [place] [address]    Report Status PENDING  Incomplete  Resp Panel by RT-PCR (Flu A&B, Covid) Nasopharyngeal Swab     Status: None   Collection Time: 08/14/20  6:20 PM   Specimen: Nasopharyngeal Swab; Nasopharyngeal(NP) swabs in vial transport medium  Result Value Ref Range Status   SARS Coronavirus 2 by RT PCR NEGATIVE NEGATIVE Final    Comment: (NOTE) SARS-CoV-2 target nucleic acids are NOT DETECTED.  The SARS-CoV-2 RNA is generally detectable in upper respiratory specimens during the acute phase of infection. The lowest concentration of SARS-CoV-2 viral copies this assay can detect is 138 copies/mL. A negative result does not preclude SARS-Cov-2 infection and should not be used as the sole basis for treatment or other patient management decisions. A negative result may occur with  improper specimen collection/handling, submission of specimen other than nasopharyngeal swab, presence of viral mutation(s) within the areas targeted by this assay, and inadequate number of viral copies(<138 copies/mL). A negative result must be combined with clinical observations, patient history, and epidemiological information. The expected result is Negative.  Fact Sheet for Patients:  EntrepreneurPulse.com.au  Fact Sheet for Healthcare Providers:  IncredibleEmployment.be  This test is no t yet approved or cleared by the [place] FDA and  has been authorized for detection  and/or diagnosis of SARS-CoV-2 by FDA under an Emergency Use Authorization (EUA). This EUA will remain  in effect (meaning this test can be used) for the duration of the COVID-19 declaration under Section 564(b)(1) of the Act, 21 U.S.C.section 360bbb-3(b)(1), unless the authorization is terminated  or revoked sooner.       Influenza A by PCR NEGATIVE NEGATIVE Final   Influenza B  by PCR NEGATIVE NEGATIVE Final    Comment: (NOTE) The Xpert Xpress SARS-CoV-2/FLU/RSV plus assay is intended as an aid in the diagnosis of influenza from Nasopharyngeal swab specimens and should not be used as a sole basis for treatment. Nasal washings and aspirates are unacceptable for Xpert Xpress SARS-CoV-2/FLU/RSV testing.  Fact Sheet for Patients: EntrepreneurPulse.com.au  Fact Sheet for Healthcare Providers: IncredibleEmployment.be  This test is not yet approved or cleared by the [place] FDA and has been authorized for detection and/or diagnosis of SARS-CoV-2 by FDA under an Emergency Use Authorization (EUA). This EUA will remain in effect (meaning this test can be used) for the duration of the COVID-19 declaration under Section 564(b)(1) of the Act, 21 U.S.C. section 360bbb-3(b)(1), unless the authorization is terminated or revoked.  Performed at [facility], [place] [address] [place], [place] [address]   Urine culture     Status: Abnormal   Collection Time: 08/14/20  9:11 PM   Specimen: Urine, Clean Catch  Result Value Ref Range Status   Specimen Description URINE, CLEAN CATCH  Final   Special Requests NONE  Final   Culture (A)  Final    <10,000 COLONIES/mL INSIGNIFICANT GROWTH Performed at [facility], [place] [address] [place], [place] [address]    Report Status 08/16/2020 FINAL  Final    Time coordinating discharge: Over 30 minutes  SIGNED:  Lorella Nimrod, MD  [facility] 08/18/2020, 9:45 AM  If 7PM-7AM, please contact  night-coverage [email or web address]  This record has been created using Systems analyst. Errors have been sought and corrected,but may not always be located. Such creation errors do not reflect on the standard of care.

## 2020-08-18 NOTE — Plan of Care (Signed)
  Problem: Education: Goal: Knowledge of General Education information will improve Description: Including pain rating scale, medication(s)/side effects and non-pharmacologic comfort measures 08/18/2020 0933 by Noe Gens, RN Outcome: Adequate for Discharge 08/18/2020 0933 by Noe Gens, RN Outcome: Adequate for Discharge   Problem: Health Behavior/Discharge Planning: Goal: Ability to manage health-related needs will improve 08/18/2020 0933 by Noe Gens, RN Outcome: Adequate for Discharge 08/18/2020 0933 by Noe Gens, RN Outcome: Adequate for Discharge   Problem: Activity: Goal: Risk for activity intolerance will decrease 08/18/2020 0933 by Noe Gens, RN Outcome: Adequate for Discharge 08/18/2020 0933 by Noe Gens, RN Outcome: Adequate for Discharge   Problem: Nutrition: Goal: Adequate nutrition will be maintained 08/18/2020 0933 by Noe Gens, RN Outcome: Adequate for Discharge 08/18/2020 0933 by Noe Gens, RN Outcome: Adequate for Discharge   Problem: Coping: Goal: Level of anxiety will decrease 08/18/2020 0933 by Noe Gens, RN Outcome: Adequate for Discharge 08/18/2020 0933 by Noe Gens, RN Outcome: Adequate for Discharge   Problem: Elimination: Goal: Will not experience complications related to bowel motility 08/18/2020 0933 by Noe Gens, RN Outcome: Adequate for Discharge 08/18/2020 0933 by Noe Gens, RN Outcome: Adequate for Discharge Goal: Will not experience complications related to urinary retention 08/18/2020 0933 by Noe Gens, RN Outcome: Adequate for Discharge 08/18/2020 0933 by Noe Gens, RN Outcome: Adequate for Discharge   Problem: Pain Managment: Goal: General experience of comfort will improve 08/18/2020 0933 by Noe Gens, RN Outcome: Adequate for Discharge 08/18/2020 0933 by Noe Gens, RN Outcome: Adequate for Discharge    Problem: Safety: Goal: Ability to remain free from injury will improve 08/18/2020 0933 by Noe Gens, RN Outcome: Adequate for Discharge 08/18/2020 0933 by Noe Gens, RN Outcome: Adequate for Discharge   Problem: Skin Integrity: Goal: Risk for impaired skin integrity will decrease 08/18/2020 0933 by Noe Gens, RN Outcome: Adequate for Discharge 08/18/2020 0933 by Noe Gens, RN Outcome: Adequate for Discharge

## 2020-08-18 NOTE — Progress Notes (Signed)
Occupational Therapy Treatment Patient Details Name: [name] MRN: [id number] DOB: 1945/12/18 Today's Date: 08/18/2020    History of present illness Patient is a 75 yo female admitted 5/8 with RUE pain, edema, weakness and SOB. Pt with sepsis due to RUE cellulitis. PMHx: AFib, CHF, AVR, MVR, SDH, Asthma, pulmonary HTN, HLD, Lt TKA.   OT comments  Pt making excellent progress with functional goals  With possibility of d/c home today. Pt has progressed to Mod I with ADLs/selfcare and mobility using no AD. No further acute OT services are indicated at this time  Follow Up Recommendations  No OT follow up;Supervision - Intermittent    Equipment Recommendations  None recommended by OT    Recommendations for Other Services      Precautions / Restrictions Precautions Precautions: Fall Precaution Comments: Keep MAP >65 Restrictions Weight Bearing Restrictions: No       Mobility Bed Mobility               General bed mobility comments: received in chair    Transfers Overall transfer level: Needs assistance Equipment used: None Transfers: Sit to/from Stand Sit to Stand: Modified independent (Device/Increase time)              Balance Overall balance assessment: Mild deficits observed, not formally tested                                         ADL either performed or assessed with clinical judgement   ADL Overall ADL's : Modified independent                                             Vision Baseline Vision/History: Wears glasses Wears Glasses: At all times Patient Visual Report: No change from baseline     Perception     Praxis      Cognition Arousal/Alertness: Awake/alert Behavior During Therapy: WFL for tasks assessed/performed Overall Cognitive Status: Within Functional Limits for tasks assessed                                          Exercises     Shoulder Instructions        General Comments      Pertinent Vitals/ Pain       Pain Assessment: No/denies pain Faces Pain Scale: No hurt Pain Intervention(s): Monitored during session  Home Living                                          Prior Functioning/Environment              Frequency  Min 2X/week        Progress Toward Goals  OT Goals(current goals can now be found in the care plan section)  Progress towards OT goals: Goals met/education completed, patient discharged from OT  Acute Rehab OT Goals Patient Stated Goal: go home  Plan Discharge plan remains appropriate    Co-evaluation                 AM-PAC OT "6 Clicks" Daily Activity  Outcome Measure   Help from another person eating meals?: None Help from another person taking care of personal grooming?: None Help from another person toileting, which includes using toliet, bedpan, or urinal?: None Help from another person bathing (including washing, rinsing, drying)?: None Help from another person to put on and taking off regular upper body clothing?: None Help from another person to put on and taking off regular lower body clothing?: None 6 Click Score: 24    End of Session    OT Visit Diagnosis: Unsteadiness on feet (R26.81);Pain Pain - Right/Left: Right   Activity Tolerance Patient tolerated treatment well   Patient Left in chair;with call bell/phone within reach   Nurse Communication          Time: 6606-0045 OT Time Calculation (min): 14 min  Charges: OT General Charges $OT Visit: 1 Visit OT Treatments $Self Care/Home Management : 8-22 mins     Britt Bottom 08/18/2020, 12:38 PM

## 2020-08-19 LAB — CULTURE, BLOOD (ROUTINE X 2)
Culture: NO GROWTH
Culture: NO GROWTH
Special Requests: ADEQUATE
Special Requests: ADEQUATE

## 2020-08-23 DIAGNOSIS — Z5181 Encounter for therapeutic drug level monitoring: Secondary | ICD-10-CM | POA: Diagnosis not present

## 2020-08-23 DIAGNOSIS — L03113 Cellulitis of right upper limb: Secondary | ICD-10-CM | POA: Diagnosis not present

## 2020-08-23 DIAGNOSIS — Z09 Encounter for follow-up examination after completed treatment for conditions other than malignant neoplasm: Secondary | ICD-10-CM | POA: Diagnosis not present

## 2020-08-31 ENCOUNTER — Ambulatory Visit (INDEPENDENT_AMBULATORY_CARE_PROVIDER_SITE_OTHER): Payer: Medicare Other

## 2020-08-31 ENCOUNTER — Other Ambulatory Visit: Payer: Self-pay

## 2020-08-31 DIAGNOSIS — I10 Essential (primary) hypertension: Secondary | ICD-10-CM | POA: Diagnosis not present

## 2020-08-31 DIAGNOSIS — I4821 Permanent atrial fibrillation: Secondary | ICD-10-CM

## 2020-08-31 DIAGNOSIS — I480 Paroxysmal atrial fibrillation: Secondary | ICD-10-CM | POA: Diagnosis not present

## 2020-08-31 DIAGNOSIS — Z5181 Encounter for therapeutic drug level monitoring: Secondary | ICD-10-CM | POA: Diagnosis not present

## 2020-08-31 DIAGNOSIS — I4819 Other persistent atrial fibrillation: Secondary | ICD-10-CM | POA: Diagnosis not present

## 2020-08-31 LAB — BASIC METABOLIC PANEL
BUN/Creatinine Ratio: 36 — ABNORMAL HIGH (ref 12–28)
BUN: 30 mg/dL — ABNORMAL HIGH (ref 8–27)
CO2: 24 mmol/L (ref 20–29)
Calcium: 9.5 mg/dL (ref 8.7–10.3)
Chloride: 98 mmol/L (ref 96–106)
Creatinine, Ser: 0.84 mg/dL (ref 0.57–1.00)
Glucose: 83 mg/dL (ref 65–99)
Potassium: 4.6 mmol/L (ref 3.5–5.2)
Sodium: 140 mmol/L (ref 134–144)
eGFR: 72 mL/min/{1.73_m2} (ref >59)

## 2020-08-31 LAB — POCT INR: INR: 1 — AB (ref 2.0–3.0)

## 2020-08-31 NOTE — Patient Instructions (Signed)
Take 3 tablets today only and then Continue taking 1 tablet daily except 2 tablets on Monday, Wednesday, Friday . Recheck INR in 2 weeks.  [facility] [phone]

## 2020-09-01 ENCOUNTER — Encounter: Payer: Self-pay | Admitting: *Deleted

## 2020-09-14 ENCOUNTER — Other Ambulatory Visit: Payer: Self-pay

## 2020-09-14 ENCOUNTER — Ambulatory Visit (INDEPENDENT_AMBULATORY_CARE_PROVIDER_SITE_OTHER): Payer: Medicare Other | Admitting: *Deleted

## 2020-09-14 ENCOUNTER — Ambulatory Visit (INDEPENDENT_AMBULATORY_CARE_PROVIDER_SITE_OTHER): Payer: Medicare Other | Admitting: Cardiovascular Disease

## 2020-09-14 ENCOUNTER — Encounter: Payer: Self-pay | Admitting: Cardiovascular Disease

## 2020-09-14 VITALS — BP 100/70 | HR 66 | Ht 67.0 in | Wt 218.6 lb

## 2020-09-14 DIAGNOSIS — Z5181 Encounter for therapeutic drug level monitoring: Secondary | ICD-10-CM

## 2020-09-14 DIAGNOSIS — I4819 Other persistent atrial fibrillation: Secondary | ICD-10-CM

## 2020-09-14 DIAGNOSIS — I480 Paroxysmal atrial fibrillation: Secondary | ICD-10-CM

## 2020-09-14 DIAGNOSIS — I340 Nonrheumatic mitral (valve) insufficiency: Secondary | ICD-10-CM

## 2020-09-14 LAB — POCT INR: INR: 2.5 (ref 2.0–3.0)

## 2020-09-14 NOTE — Patient Instructions (Signed)
Description   Continue taking 1 tablet daily except 2 tablets on Monday, Wednesday, and Friday. Recheck INR in 2 weeks at [facility]. [facility] [phone]

## 2020-09-14 NOTE — H&P (View-Only) (Signed)
Cardiology Office Note:    Date:  09/18/2020   ID:  [name], DOB October 03, 1945, MRN [id number]  PCP:  Maurice Small, MD   [facility] Providers Cardiologist:  Sanda Klein, MD     Referring MD: Maurice Small, MD   Chief Complaint  Patient presents with   Shortness of Breath    History of Present Illness:    [name] is a 75 y.o. female presenting for evaluation of severe mitral regurgitation.  The patient is referred by Dr. Sallyanne Kuster.  She has chronic combined systolic and diastolic heart failure, permanent atrial fibrillation with history of 2 prior ablation procedures, and history of mechanical aortic valve replacement in 2006.  She was hospitalized about 1 year ago with acute systolic heart failure and LVEF reduction to 30 to 35% range.  She was started on Entresto and low-dose carvedilol with improvement in LV function.  However, she has continued to demonstrate findings of severe mitral regurgitation.  She was last seen by Dr. Sallyanne Kuster in April 2022 and was noted at that time to have some evidence of volume overload.  Furosemide was increased.  There was discussion about referral for consideration of transcatheter edge-to-edge mitral valve repair considering her ongoing symptoms of heart failure on guideline directed medical therapy.  The patient is here with her sister today. She states that she 'stays tired' all of the time. She is short of breath with limited activity such as walking from room to room in her house. She denies chest pain or pressure. She uses CPAP at night and doesn't have orthopnea as long as she uses this. No PND. She denies current problems with swelling but has had some recent problems with leg swelling when her HF has been decompensated.   She sees her dentist every 6 months and doesn't have any problems with her teeth. Denies any recent bleeding problems on chronic warfarin.   Past Medical History:  Diagnosis Date   Anxiety    Aortic  stenosis    status post aortic valve replacement with St. 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   Depression    DJD (degenerative joint disease) of knee    Dyslipidemia    GERD (gastroesophageal reflux disease)    "several years ago; none since" (11/12/2012)   Hyperlipidemia    Hyperlipidemia LDL goal <70 01/19/2017   Hypertension    Left atrial enlargement    Mitral regurgitation 06/25/2016   Mild moderate MR by echo 06/2016   Obesity    Persistent atrial fibrillation (HCC)    s/p afib ablation x 2 at [facility]   Sleep apnea    On CPAP at 12cm H2O   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 Jude AVR   CARDIOVERSION N/A 07/22/2012   Procedure: CARDIOVERSION;  Surgeon: [name], MD;  Location: [facility] ENDOSCOPY;  Service: Cardiovascular;  Laterality: N/A;   CARDIOVERSION N/A 11/25/2012   Procedure: CARDIOVERSION;  Surgeon: [name], MD;  Location: [facility] ENDOSCOPY;  Service: Cardiovascular;  Laterality: N/A;  CARDIOVERSION N/A 12/30/2012   Procedure: CARDIOVERSION;  Surgeon: [name], MD;  Location: [place];  Service: Cardiovascular;  Laterality: N/A;  h&p in file-HW    CARDIOVERSION N/A 03/30/2013   Procedure: CARDIOVERSION;  Surgeon: [name], MD;  Location: [place];  Service: Cardiovascular;  Laterality: N/A;   ELECTROPHYSIOLOGIC STUDY  11/2013   Afib ablation x 2 (11/2013 and 10/2015) at [facility] by Dr Clyda Hurdle with recurrence post ablation   KNEE ARTHROSCOPY Left 1980's   "2" (11/12/2012)   KNEE SURGERY Left 1980's   "after 2 scopes they went in and did some kind of OR"  (11/12/2012)   TEE WITHOUT CARDIOVERSION N/A 07/22/2012   Procedure: TRANSESOPHAGEAL ECHOCARDIOGRAM (TEE);  Surgeon: [name], MD;  Location: [facility] ENDOSCOPY;  Service: Cardiovascular;  Laterality: N/A;  Rm 2034   TEE WITHOUT CARDIOVERSION N/A 10/07/2013   Procedure: TRANSESOPHAGEAL ECHOCARDIOGRAM (TEE);  Surgeon: [name], MD;  Location: [facility] ENDOSCOPY;  Service: Cardiovascular;  Laterality: N/A;   TEE WITHOUT CARDIOVERSION N/A 07/12/2020   Procedure: TRANSESOPHAGEAL ECHOCARDIOGRAM (TEE);  Surgeon: Sanda Klein, MD;  Location: [place];  Service: Cardiovascular;  Laterality: N/A;   TOTAL KNEE ARTHROPLASTY Left 11/05/2014   Procedure: TOTAL KNEE ARTHROPLASTY;  Surgeon: Dorna Leitz, MD;  Location: [place];  Service: Orthopedics;  Laterality: Left;   TUBAL LIGATION  1980's    Current Medications: Current Meds  Medication Sig   acetaminophen (TYLENOL) 500 MG tablet Take 1,000 mg by mouth daily as needed for mild pain.   ALPRAZolam (XANAX) 0.5 MG tablet Take 0.5 mg by mouth 2 (two) times daily as needed for anxiety.    calcium-vitamin D (OSCAL WITH D) 500-200 MG-UNIT per tablet Take 2 tablets by mouth daily.    carvedilol (COREG) 3.125 MG tablet TAKE 1 TABLET(3.125 MG) BY MOUTH TWICE DAILY WITH A MEAL   Cholecalciferol (VITAMIN D) 2000 UNITS tablet Take 4,000 Units by mouth daily.   famotidine (PEPCID) 10 MG tablet Take 10 mg by mouth 2 (two) times daily as needed for heartburn or indigestion.   ferrous sulfate 325 (65 FE) MG tablet Take 325 mg by mouth daily with breakfast.   furosemide (LASIX) 40 MG tablet Take 1 tablet (40 mg total) by mouth 2 (two) times daily. For weights above 212 pounds, take 80mg  in the morning and 40mg  in the evening.   levothyroxine (SYNTHROID, LEVOTHROID) 100 MCG tablet Take 100 mcg daily before breakfast by mouth.   Multiple Vitamin (MULTIVITAMIN WITH MINERALS) TABS Take 1 tablet by mouth daily.   oxymetazoline (AFRIN) 0.05 % nasal spray Place 1 spray into both  nostrils daily as needed for congestion.   potassium chloride SA (KLOR-CON) 20 MEQ tablet TAKE 2 TABLETS(40 MEQ) BY MOUTH DAILY   pravastatin (PRAVACHOL) 20 MG tablet TAKE 1 TABLET BY MOUTH EVERY DAY   sacubitril-valsartan (ENTRESTO) 97-103 MG Take 1 tablet by mouth 2 (two) times daily.   sertraline (ZOLOFT) 100 MG tablet Take 100 mg by mouth daily.   traMADol (ULTRAM) 50 MG tablet Take 50 mg by mouth every 12 (twelve) hours as needed for severe pain.   tretinoin (RETIN-A) 0.05 % cream Apply 1 application topically at bedtime.    warfarin (COUMADIN) 4 MG tablet Take 4 mg by mouth daily. Per patient taking (2) 4 mg tablets on Mon., Wed., and Friday and taking 4 mg (1) tablet on Tues., Thurs., and Sat., and Sunday   zolpidem (AMBIEN) 10 MG tablet Take 5 mg by mouth at bedtime as needed for  sleep.     Allergies:   Codeine, Metoprolol, Propoxyphene, and Dilantin [phenytoin sodium extended]   Social History   Socioeconomic History   Marital status: Married    Spouse name: Not on file   Number of children: Not on file   Years of education: Not on file   Highest education level: Not on file  Occupational History   Not on file  Tobacco Use   Smoking status: Never   Smokeless tobacco: Never  Substance and Sexual Activity   Alcohol use: No    Comment: 11/12/2012 "no alcohol in the last 9 years"   Drug use: No   Sexual activity: Not Currently  Other Topics Concern   Not on file  Social History Narrative   Pt lives in [place] with spouse.  Retired   Investment banker, operational of Radio broadcast assistant Strain: Not on [employer] Insecurity: Not on file  Transportation Needs: Not on file  Physical Activity: Inactive   Days of Exercise per Week: 0 days   Minutes of Exercise per Session: 0 min  Stress: Not on file  Social Connections: Unknown   Frequency of Communication with Friends and Family: Once a week   Frequency of Social Gatherings with Friends and Family: Not on file   Attends  Religious Services: 1 to 4 times per year   Active Member of [employer] or Organizations: No   Attends Music therapist: Never   Marital Status: Married     Family History: The patient's family history includes Heart disease in her brother; Hyperlipidemia in her father; Hypertension in her father and mother; Lung cancer in her mother.  ROS:   Please see the history of present illness.    All other systems reviewed and are negative.  EKGs/Labs/Other Studies Reviewed:    The following studies were reviewed today: TEE:  1. Left ventricular ejection fraction, by estimation, is 50 to 55%. The  left ventricle has low normal function. The left ventricle demonstrates  global hypokinesis. Left ventricular diastolic function could not be  evaluated.   2. Left atrial size was severely dilated. No left atrial/left atrial  appendage thrombus was detected. The LAA emptying velocity was 50 cm/s.  (the rhythm was atrial flutter).   3. Right atrial size was severely dilated.   4. The mitral valve is rheumatic. Moderate to severe mitral valve  regurgitation. No evidence of mitral stenosis. The mean mitral valve  gradient is 3.0 mmHg.   5. Tricuspid valve regurgitation is moderate.   6. The aortic valve has been repaired/replaced. Aortic valve  regurgitation is trivial. No aortic stenosis is present. There is a 19 mm  mechanical valve present in the aortic position. Echo findings are  consistent with normal structure and function of  the aortic valve prosthesis. Aortic valve mean gradient measures 3.0 mmHg.  Aortic valve Vmax measures 1.09 m/s.   7. Aortic dilatation noted. There is mild dilatation of the ascending  aorta, measuring 40 mm. There is Moderate (Grade III) atheroma plaque  involving the descending aorta.   8. Evidence of atrial level shunting detected by color flow Doppler.  There is a small patent foramen ovale with predominantly left to right  shunting across the atrial  septum.   9. Right ventricular systolic function is normal. The right ventricular  size is normal. There is mildly elevated pulmonary artery systolic  pressure.    Recent Labs: 08/14/2020: ALT 19 08/15/2020: B Natriuretic Peptide 598.4; Magnesium 2.1; TSH  4.836 08/18/2020: Hemoglobin 10.7; Platelets 207 08/31/2020: BUN 30; Creatinine, Ser 0.84; Potassium 4.6; Sodium 140  Recent Lipid Panel    Component Value Date/Time   CHOL 175 05/27/2018 1125   TRIG 100 05/27/2018 1125   HDL 71 05/27/2018 1125   CHOLHDL 2.5 05/27/2018 1125   LDLCALC 84 05/27/2018 1125     Risk Assessment/Calculations:    CHA2DS2-VASc Score = 5  his indicates a 7.2% annual risk of stroke. The patient's score is based upon: CHF History: Yes HTN History: Yes Diabetes History: No Stroke History: No Vascular Disease History: No Age Score: 2 Gender Score: 1      Physical Exam:    VS:  BP 100/70   Pulse 66   Ht 5\' 7"  (1.702 m)   Wt 218 lb 9.6 oz (99.2 kg)   SpO2 98%   BMI 34.24 kg/m     Wt Readings from Last 3 Encounters:  09/14/20 218 lb 9.6 oz (99.2 kg)  08/18/20 215 lb 3.2 oz (97.6 kg)  07/20/20 219 lb 3.2 oz (99.4 kg)     GEN:  Well nourished, well developed in no acute distress HEENT: Normal NECK: No JVD; No carotid bruits LYMPHATICS: No lymphadenopathy CARDIAC: irregularly irregular, 2/6 holosystolic murmur at the apex/LLSB RESPIRATORY:  Clear to auscultation without rales, wheezing or rhonchi  ABDOMEN: Soft, non-tender, non-distended MUSCULOSKELETAL:  No edema; No deformity  SKIN: Warm and dry NEUROLOGIC:  Alert and oriented x 3 PSYCHIATRIC:  Normal affect   ASSESSMENT:    1. Severe mitral regurgitation    PLAN:    In order of problems listed above:  The patient has NYHA functional class 3 limitation of exertional dyspnea, chronic combined systolic and diastolic heart failure, and moderately severe mitral regurgitation (grade 3+). I have personally reviewed the patient's TEE  demonstrating low normal LV systolic function, and central mitral regurgitation with a raidus of 0.7, PISA of 0.23, and antegrade pulmonary vein flow. The 3D EROA suggests more severe MR. The patient has somewhat indeterminate severity MR, raising some question about the utility of intervention such as mitral valve edge to edge repair. The patient is complex with prior mechanical AVR. I think it would be helpful to do a right and left heart catheterization to fully evaluate the hemodynamic significance of the patient's mitral regurgitation. Her last catheterization in 2006 predating her original surgery showed no evidence of obstructive CAD. I have reviewed the risks, indications, and alternatives to cardiac catheterization, possible angioplasty, and stenting with the patient. Risks include but are not limited to bleeding, infection, vascular injury, stroke, myocardial infection, arrhythmia, kidney injury, radiation-related injury in the case of prolonged fluoroscopy use, emergency cardiac surgery, and death. The patient understands the risks of serious complication is 1-2 in 1017 with diagnostic cardiac cath and 1-2% or less with angioplasty/stenting.    Once her catheterization is completed, will determine next steps in her evaluation and whether she might be a candidate for MitraClip. As part of today's consultation, I reviewed the Mitraclip procedure with the patient and discussed procedural steps, potential risks, and expected recovery. All of her questions are answered.    Shared Decision Making/Informed Consent The risks [stroke (1 in 1000), death (1 in 1000), kidney failure [usually temporary] (1 in 500), bleeding (1 in 200), allergic reaction [possibly serious] (1 in 200)], benefits (diagnostic support and management of coronary artery disease) and alternatives of a cardiac catheterization were discussed in detail with [name] and she is willing to proceed.  Medication Adjustments/Labs and  Tests Ordered: Current medicines are reviewed at length with the patient today.  Concerns regarding medicines are outlined above.  Orders Placed This Encounter  Procedures   Basic metabolic panel   CBC with Differential/Platelet   EKG 12-Lead   No orders of the defined types were placed in this encounter.   Patient Instructions  CATHETERIZATION INSTRUCTIONS: You are scheduled for a Cardiac Catheterization on: Friday, October 14, 2020 with Dr. Sherren Mocha.  1. Please arrive at the [facility] (Main Entrance A) at [facility]: [address] [place], [place] [address] at 5:30 AM (This time is two hours before your procedure to ensure your preparation). Free valet parking service is available. You are allowed ONE visitor in the waiting room during your procedure. Both you and your guest must wear masks. Special note: Every effort is made to have your procedure done on time. Please understand that emergencies sometimes delay scheduled procedures.  2. Diet: Do not eat solid foods after midnight.  You may have clear liquids until 5am upon the day of the procedure.  3. Labs: You will have labs drawn when you go to get your Lovenox bridging instructions.   4. Medication instructions in preparation for your procedure:  1) You will get Coumadin and Lovenox bridging instructions at your next Coumadin visit  2) HOLD LASIX the morning of your procedure  3) TAKE ASPIRIN 81 mg the morning of your cath  4) You may take your other meds as directed with a sip of water   5. Plan for one night stay--bring personal belongings. 6. Bring a current list of your medications and current insurance cards. 7. You MUST have a responsible person to drive you home. 8. Someone MUST be with you the first 24 hours after you arrive home or your discharge will be delayed. 9. Please wear clothes that are easy to get on and off and wear slip-on shoes.  Thank you for allowing [place] to care for you!   -- [facility]  Invasive Cardiovascular services   Signed, Sherren Mocha, MD  09/18/2020 1:35 PM    [place]

## 2020-09-14 NOTE — Progress Notes (Signed)
Cardiology Office Note:    Date:  09/18/2020   ID:  KEYONI LAPINSKI, DOB 21-Sep-1945, MRN [id number]  PCP:  Maurice Small, MD   [facility] Providers Cardiologist:  Sanda Klein, MD     Referring MD: Maurice Small, MD   Chief Complaint  Patient presents with   Shortness of Breath    History of Present Illness:    [name] is a 75 y.o. female presenting for evaluation of severe mitral regurgitation.  The patient is referred by Dr. Sallyanne Kuster.  She has chronic combined systolic and diastolic heart failure, permanent atrial fibrillation with history of 2 prior ablation procedures, and history of mechanical aortic valve replacement in 2006.  She was hospitalized about 1 year ago with acute systolic heart failure and LVEF reduction to 30 to 35% range.  She was started on Entresto and low-dose carvedilol with improvement in LV function.  However, she has continued to demonstrate findings of severe mitral regurgitation.  She was last seen by Dr. Sallyanne Kuster in April 2022 and was noted at that time to have some evidence of volume overload.  Furosemide was increased.  There was discussion about referral for consideration of transcatheter edge-to-edge mitral valve repair considering her ongoing symptoms of heart failure on guideline directed medical therapy.  The patient is here with her sister today. She states that she 'stays tired' all of the time. She is short of breath with limited activity such as walking from room to room in her house. She denies chest pain or pressure. She uses CPAP at night and doesn't have orthopnea as long as she uses this. No PND. She denies current problems with swelling but has had some recent problems with leg swelling when her HF has been decompensated.   She sees her dentist every 6 months and doesn't have any problems with her teeth. Denies any recent bleeding problems on chronic warfarin.   Past Medical History:  Diagnosis Date   Anxiety    Aortic  stenosis    status post aortic valve replacement with St. 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   Depression    DJD (degenerative joint disease) of knee    Dyslipidemia    GERD (gastroesophageal reflux disease)    "several years ago; none since" (11/12/2012)   Hyperlipidemia    Hyperlipidemia LDL goal <70 01/19/2017   Hypertension    Left atrial enlargement    Mitral regurgitation 06/25/2016   Mild moderate MR by echo 06/2016   Obesity    Persistent atrial fibrillation (HCC)    s/p afib ablation x 2 at [facility]   Sleep apnea    On CPAP at 12cm H2O   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 Jude AVR   CARDIOVERSION N/A 07/22/2012   Procedure: CARDIOVERSION;  Surgeon: [name], MD;  Location: [facility] ENDOSCOPY;  Service: Cardiovascular;  Laterality: N/A;   CARDIOVERSION N/A 11/25/2012   Procedure: CARDIOVERSION;  Surgeon: [name], MD;  Location: [facility] ENDOSCOPY;  Service: Cardiovascular;  Laterality: N/A;  CARDIOVERSION N/A 12/30/2012   Procedure: CARDIOVERSION;  Surgeon: [name], MD;  Location: [place];  Service: Cardiovascular;  Laterality: N/A;  h&p in file-HW    CARDIOVERSION N/A 03/30/2013   Procedure: CARDIOVERSION;  Surgeon: [name], MD;  Location: [place];  Service: Cardiovascular;  Laterality: N/A;   ELECTROPHYSIOLOGIC STUDY  11/2013   Afib ablation x 2 (11/2013 and 10/2015) at [facility] by Dr Clyda Hurdle with recurrence post ablation   KNEE ARTHROSCOPY Left 1980's   "2" (11/12/2012)   KNEE SURGERY Left 1980's   "after 2 scopes they went in and did some kind of OR"  (11/12/2012)   TEE WITHOUT CARDIOVERSION N/A 07/22/2012   Procedure: TRANSESOPHAGEAL ECHOCARDIOGRAM (TEE);  Surgeon: [name], MD;  Location: [facility] ENDOSCOPY;  Service: Cardiovascular;  Laterality: N/A;  Rm 2034   TEE WITHOUT CARDIOVERSION N/A 10/07/2013   Procedure: TRANSESOPHAGEAL ECHOCARDIOGRAM (TEE);  Surgeon: [name], MD;  Location: [facility] ENDOSCOPY;  Service: Cardiovascular;  Laterality: N/A;   TEE WITHOUT CARDIOVERSION N/A 07/12/2020   Procedure: TRANSESOPHAGEAL ECHOCARDIOGRAM (TEE);  Surgeon: Sanda Klein, MD;  Location: [place];  Service: Cardiovascular;  Laterality: N/A;   TOTAL KNEE ARTHROPLASTY Left 11/05/2014   Procedure: TOTAL KNEE ARTHROPLASTY;  Surgeon: Dorna Leitz, MD;  Location: [place];  Service: Orthopedics;  Laterality: Left;   TUBAL LIGATION  1980's    Current Medications: Current Meds  Medication Sig   acetaminophen (TYLENOL) 500 MG tablet Take 1,000 mg by mouth daily as needed for mild pain.   ALPRAZolam (XANAX) 0.5 MG tablet Take 0.5 mg by mouth 2 (two) times daily as needed for anxiety.    calcium-vitamin D (OSCAL WITH D) 500-200 MG-UNIT per tablet Take 2 tablets by mouth daily.    carvedilol (COREG) 3.125 MG tablet TAKE 1 TABLET(3.125 MG) BY MOUTH TWICE DAILY WITH A MEAL   Cholecalciferol (VITAMIN D) 2000 UNITS tablet Take 4,000 Units by mouth daily.   famotidine (PEPCID) 10 MG tablet Take 10 mg by mouth 2 (two) times daily as needed for heartburn or indigestion.   ferrous sulfate 325 (65 FE) MG tablet Take 325 mg by mouth daily with breakfast.   furosemide (LASIX) 40 MG tablet Take 1 tablet (40 mg total) by mouth 2 (two) times daily. For weights above 212 pounds, take 80mg  in the morning and 40mg  in the evening.   levothyroxine (SYNTHROID, LEVOTHROID) 100 MCG tablet Take 100 mcg daily before breakfast by mouth.   Multiple Vitamin (MULTIVITAMIN WITH MINERALS) TABS Take 1 tablet by mouth daily.   oxymetazoline (AFRIN) 0.05 % nasal spray Place 1 spray into both  nostrils daily as needed for congestion.   potassium chloride SA (KLOR-CON) 20 MEQ tablet TAKE 2 TABLETS(40 MEQ) BY MOUTH DAILY   pravastatin (PRAVACHOL) 20 MG tablet TAKE 1 TABLET BY MOUTH EVERY DAY   sacubitril-valsartan (ENTRESTO) 97-103 MG Take 1 tablet by mouth 2 (two) times daily.   sertraline (ZOLOFT) 100 MG tablet Take 100 mg by mouth daily.   traMADol (ULTRAM) 50 MG tablet Take 50 mg by mouth every 12 (twelve) hours as needed for severe pain.   tretinoin (RETIN-A) 0.05 % cream Apply 1 application topically at bedtime.    warfarin (COUMADIN) 4 MG tablet Take 4 mg by mouth daily. Per patient taking (2) 4 mg tablets on Mon., Wed., and Friday and taking 4 mg (1) tablet on Tues., Thurs., and Sat., and Sunday   zolpidem (AMBIEN) 10 MG tablet Take 5 mg by mouth at bedtime as needed for  sleep.     Allergies:   Codeine, Metoprolol, Propoxyphene, and Dilantin [phenytoin sodium extended]   Social History   Socioeconomic History   Marital status: Married    Spouse name: Not on file   Number of children: Not on file   Years of education: Not on file   Highest education level: Not on file  Occupational History   Not on file  Tobacco Use   Smoking status: Never   Smokeless tobacco: Never  Substance and Sexual Activity   Alcohol use: No    Comment: 11/12/2012 "no alcohol in the last 9 years"   Drug use: No   Sexual activity: Not Currently  Other Topics Concern   Not on file  Social History Narrative   Pt lives in [place] with spouse.  Retired   Investment banker, operational of Radio broadcast assistant Strain: Not on [employer] Insecurity: Not on file  Transportation Needs: Not on file  Physical Activity: Inactive   Days of Exercise per Week: 0 days   Minutes of Exercise per Session: 0 min  Stress: Not on file  Social Connections: Unknown   Frequency of Communication with Friends and Family: Once a week   Frequency of Social Gatherings with Friends and Family: Not on file   Attends  Religious Services: 1 to 4 times per year   Active Member of [employer] or Organizations: No   Attends Music therapist: Never   Marital Status: Married     Family History: The patient's family history includes Heart disease in her brother; Hyperlipidemia in her father; Hypertension in her father and mother; Lung cancer in her mother.  ROS:   Please see the history of present illness.    All other systems reviewed and are negative.  EKGs/Labs/Other Studies Reviewed:    The following studies were reviewed today: TEE:  1. Left ventricular ejection fraction, by estimation, is 50 to 55%. The  left ventricle has low normal function. The left ventricle demonstrates  global hypokinesis. Left ventricular diastolic function could not be  evaluated.   2. Left atrial size was severely dilated. No left atrial/left atrial  appendage thrombus was detected. The LAA emptying velocity was 50 cm/s.  (the rhythm was atrial flutter).   3. Right atrial size was severely dilated.   4. The mitral valve is rheumatic. Moderate to severe mitral valve  regurgitation. No evidence of mitral stenosis. The mean mitral valve  gradient is 3.0 mmHg.   5. Tricuspid valve regurgitation is moderate.   6. The aortic valve has been repaired/replaced. Aortic valve  regurgitation is trivial. No aortic stenosis is present. There is a 19 mm  mechanical valve present in the aortic position. Echo findings are  consistent with normal structure and function of  the aortic valve prosthesis. Aortic valve mean gradient measures 3.0 mmHg.  Aortic valve Vmax measures 1.09 m/s.   7. Aortic dilatation noted. There is mild dilatation of the ascending  aorta, measuring 40 mm. There is Moderate (Grade III) atheroma plaque  involving the descending aorta.   8. Evidence of atrial level shunting detected by color flow Doppler.  There is a small patent foramen ovale with predominantly left to right  shunting across the atrial  septum.   9. Right ventricular systolic function is normal. The right ventricular  size is normal. There is mildly elevated pulmonary artery systolic  pressure.    Recent Labs: 08/14/2020: ALT 19 08/15/2020: B Natriuretic Peptide 598.4; Magnesium 2.1; TSH  4.836 08/18/2020: Hemoglobin 10.7; Platelets 207 08/31/2020: BUN 30; Creatinine, Ser 0.84; Potassium 4.6; Sodium 140  Recent Lipid Panel    Component Value Date/Time   CHOL 175 05/27/2018 1125   TRIG 100 05/27/2018 1125   HDL 71 05/27/2018 1125   CHOLHDL 2.5 05/27/2018 1125   LDLCALC 84 05/27/2018 1125     Risk Assessment/Calculations:    CHA2DS2-VASc Score = 5  his indicates a 7.2% annual risk of stroke. The patient's score is based upon: CHF History: Yes HTN History: Yes Diabetes History: No Stroke History: No Vascular Disease History: No Age Score: 2 Gender Score: 1      Physical Exam:    VS:  BP 100/70   Pulse 66   Ht 5\' 7"  (1.702 m)   Wt 218 lb 9.6 oz (99.2 kg)   SpO2 98%   BMI 34.24 kg/m     Wt Readings from Last 3 Encounters:  09/14/20 218 lb 9.6 oz (99.2 kg)  08/18/20 215 lb 3.2 oz (97.6 kg)  07/20/20 219 lb 3.2 oz (99.4 kg)     GEN:  Well nourished, well developed in no acute distress HEENT: Normal NECK: No JVD; No carotid bruits LYMPHATICS: No lymphadenopathy CARDIAC: irregularly irregular, 2/6 holosystolic murmur at the apex/LLSB RESPIRATORY:  Clear to auscultation without rales, wheezing or rhonchi  ABDOMEN: Soft, non-tender, non-distended MUSCULOSKELETAL:  No edema; No deformity  SKIN: Warm and dry NEUROLOGIC:  Alert and oriented x 3 PSYCHIATRIC:  Normal affect   ASSESSMENT:    1. Severe mitral regurgitation    PLAN:    In order of problems listed above:  The patient has NYHA functional class 3 limitation of exertional dyspnea, chronic combined systolic and diastolic heart failure, and moderately severe mitral regurgitation (grade 3+). I have personally reviewed the patient's TEE  demonstrating low normal LV systolic function, and central mitral regurgitation with a raidus of 0.7, PISA of 0.23, and antegrade pulmonary vein flow. The 3D EROA suggests more severe MR. The patient has somewhat indeterminate severity MR, raising some question about the utility of intervention such as mitral valve edge to edge repair. The patient is complex with prior mechanical AVR. I think it would be helpful to do a right and left heart catheterization to fully evaluate the hemodynamic significance of the patient's mitral regurgitation. Her last catheterization in 2006 predating her original surgery showed no evidence of obstructive CAD. I have reviewed the risks, indications, and alternatives to cardiac catheterization, possible angioplasty, and stenting with the patient. Risks include but are not limited to bleeding, infection, vascular injury, stroke, myocardial infection, arrhythmia, kidney injury, radiation-related injury in the case of prolonged fluoroscopy use, emergency cardiac surgery, and death. The patient understands the risks of serious complication is 1-2 in 0076 with diagnostic cardiac cath and 1-2% or less with angioplasty/stenting.    Once her catheterization is completed, will determine next steps in her evaluation and whether she might be a candidate for MitraClip. As part of today's consultation, I reviewed the Mitraclip procedure with the patient and discussed procedural steps, potential risks, and expected recovery. All of her questions are answered.    Shared Decision Making/Informed Consent The risks [stroke (1 in 1000), death (1 in 1000), kidney failure [usually temporary] (1 in 500), bleeding (1 in 200), allergic reaction [possibly serious] (1 in 200)], benefits (diagnostic support and management of coronary artery disease) and alternatives of a cardiac catheterization were discussed in detail with Ms. Meloche and she is willing to proceed.  Medication Adjustments/Labs and  Tests Ordered: Current medicines are reviewed at length with the patient today.  Concerns regarding medicines are outlined above.  Orders Placed This Encounter  Procedures   Basic metabolic panel   CBC with Differential/Platelet   EKG 12-Lead   No orders of the defined types were placed in this encounter.   Patient Instructions  CATHETERIZATION INSTRUCTIONS: You are scheduled for a Cardiac Catheterization on: Friday, October 14, 2020 with Dr. Sherren Mocha.  1. Please arrive at the [facility] (Main Entrance A) at [facility]: [address] [place], [place] [address] at 5:30 AM (This time is two hours before your procedure to ensure your preparation). Free valet parking service is available. You are allowed ONE visitor in the waiting room during your procedure. Both you and your guest must wear masks. Special note: Every effort is made to have your procedure done on time. Please understand that emergencies sometimes delay scheduled procedures.  2. Diet: Do not eat solid foods after midnight.  You may have clear liquids until 5am upon the day of the procedure.  3. Labs: You will have labs drawn when you go to get your Lovenox bridging instructions.   4. Medication instructions in preparation for your procedure:  1) You will get Coumadin and Lovenox bridging instructions at your next Coumadin visit  2) HOLD LASIX the morning of your procedure  3) TAKE ASPIRIN 81 mg the morning of your cath  4) You may take your other meds as directed with a sip of water   5. Plan for one night stay--bring personal belongings. 6. Bring a current list of your medications and current insurance cards. 7. You MUST have a responsible person to drive you home. 8. Someone MUST be with you the first 24 hours after you arrive home or your discharge will be delayed. 9. Please wear clothes that are easy to get on and off and wear slip-on shoes.  Thank you for allowing [place] to care for you!   -- [facility]  Invasive Cardiovascular services   Signed, Sherren Mocha, MD  09/18/2020 1:35 PM    [place]

## 2020-09-14 NOTE — Patient Instructions (Signed)
CATHETERIZATION INSTRUCTIONS: You are scheduled for a Cardiac Catheterization on: Friday, October 14, 2020 with Dr. Sherren Mocha.  1. Please arrive at the [facility] (Main Entrance A) at [facility]: [address] [place], [place] [address] at 5:30 AM (This time is two hours before your procedure to ensure your preparation). Free valet parking service is available. You are allowed ONE visitor in the waiting room during your procedure. Both you and your guest must wear masks. Special note: Every effort is made to have your procedure done on time. Please understand that emergencies sometimes delay scheduled procedures.  2. Diet: Do not eat solid foods after midnight.  You may have clear liquids until 5am upon the day of the procedure.  3. Labs: You will have labs drawn when you go to get your Lovenox bridging instructions.   4. Medication instructions in preparation for your procedure:  1) You will get Coumadin and Lovenox bridging instructions at your next Coumadin visit  2) HOLD LASIX the morning of your procedure  3) TAKE ASPIRIN 81 mg the morning of your cath  4) You may take your other meds as directed with a sip of water   5. Plan for one night stay--bring personal belongings. 6. Bring a current list of your medications and current insurance cards. 7. You MUST have a responsible person to drive you home. 8. Someone MUST be with you the first 24 hours after you arrive home or your discharge will be delayed. 9. Please wear clothes that are easy to get on and off and wear slip-on shoes.  Thank you for allowing [place] to care for you!   -- [place] Invasive Cardiovascular services

## 2020-09-15 ENCOUNTER — Telehealth: Payer: Self-pay | Admitting: *Deleted

## 2020-09-15 DIAGNOSIS — Z23 Encounter for immunization: Secondary | ICD-10-CM | POA: Diagnosis not present

## 2020-09-15 NOTE — Telephone Encounter (Signed)
Received message that pt will have L/RHC on 10/14/20 and will need lovenox bridging; pt has an upcoming appt on 09/28/20 at her primary anticoagulation office at [facility].  Sent the [facility] clinical staff a message to make them aware of this and placed a note on the appt & noted she needs labwork, too.

## 2020-09-15 NOTE — Telephone Encounter (Signed)
-----   Message from Theodoro Parma, RN sent at 09/14/2020  5:42 PM EDT ----- Regarding: [facility] 7/8 L/RHC scheduled 7/8 with Dr. Burt Knack Dx: MR Arrival time 0530 for 0730 case  Coumadin Clinic- she is scheduled 6/22 for INR check. She will need Lovenox bridging management. Not sure if you want to push her check out a few days or how that would work best for you guys. She will also need BMET and CBC at this visit and the orders are in.  Lauren- [facility] is in.  Thanks everyone!! KK

## 2020-09-18 ENCOUNTER — Encounter: Payer: Self-pay | Admitting: Cardiovascular Disease

## 2020-09-28 ENCOUNTER — Other Ambulatory Visit: Payer: Self-pay

## 2020-09-28 ENCOUNTER — Ambulatory Visit (INDEPENDENT_AMBULATORY_CARE_PROVIDER_SITE_OTHER): Payer: Medicare Other

## 2020-09-28 DIAGNOSIS — I4819 Other persistent atrial fibrillation: Secondary | ICD-10-CM

## 2020-09-28 DIAGNOSIS — Z5181 Encounter for therapeutic drug level monitoring: Secondary | ICD-10-CM | POA: Diagnosis not present

## 2020-09-28 DIAGNOSIS — I340 Nonrheumatic mitral (valve) insufficiency: Secondary | ICD-10-CM | POA: Diagnosis not present

## 2020-09-28 DIAGNOSIS — I480 Paroxysmal atrial fibrillation: Secondary | ICD-10-CM | POA: Diagnosis not present

## 2020-09-28 LAB — CBC WITH DIFFERENTIAL/PLATELET
Basophils Absolute: 0 10*3/uL (ref 0.0–0.2)
Basos: 1 %
EOS (ABSOLUTE): 0.2 10*3/uL (ref 0.0–0.4)
Eos: 5 %
Hematocrit: 34.6 % (ref 34.0–46.6)
Hemoglobin: 11.7 g/dL (ref 11.1–15.9)
Immature Grans (Abs): 0 10*3/uL (ref 0.0–0.1)
Immature Granulocytes: 0 %
Lymphocytes Absolute: 0.9 10*3/uL (ref 0.7–3.1)
Lymphs: 20 %
MCH: 31 pg (ref 26.6–33.0)
MCHC: 33.8 g/dL (ref 31.5–35.7)
MCV: 92 fL (ref 79–97)
Monocytes Absolute: 0.6 10*3/uL (ref 0.1–0.9)
Monocytes: 12 %
Neutrophils Absolute: 2.8 10*3/uL (ref 1.4–7.0)
Neutrophils: 62 %
Platelets: 207 10*3/uL (ref 150–450)
RBC: 3.77 x10E6/uL (ref 3.77–5.28)
RDW: 12.6 % (ref 11.7–15.4)
WBC: 4.5 10*3/uL (ref 3.4–10.8)

## 2020-09-28 LAB — BASIC METABOLIC PANEL
BUN/Creatinine Ratio: 34 — ABNORMAL HIGH (ref 12–28)
BUN: 30 mg/dL — ABNORMAL HIGH (ref 8–27)
CO2: 25 mmol/L (ref 20–29)
Calcium: 9.4 mg/dL (ref 8.7–10.3)
Chloride: 100 mmol/L (ref 96–106)
Creatinine, Ser: 0.88 mg/dL (ref 0.57–1.00)
Glucose: 85 mg/dL (ref 65–99)
Potassium: 4.6 mmol/L (ref 3.5–5.2)
Sodium: 141 mmol/L (ref 134–144)
eGFR: 68 mL/min/{1.73_m2} (ref >59)

## 2020-09-28 LAB — POCT INR: INR: 2.1 (ref 2.0–3.0)

## 2020-09-28 MED ORDER — ENOXAPARIN SODIUM 100 MG/ML IJ SOSY: 100.0000 mg | PREFILLED_SYRINGE | Freq: Two times a day (BID) | INTRAMUSCULAR | 1 refills | Status: DC

## 2020-09-28 NOTE — Patient Instructions (Signed)
Continue taking 1 tablet daily except 2 tablets on Monday, Wednesday, and Friday. Recheck INR in  3 weeks at [facility]. Coumadin Clinic [phone]  7/2: Last dose of warfarin.  7/3: No warfarin or enoxaparin (Lovenox).  7/4: Inject enoxaparin 100mg  in the fatty abdominal tissue at least 2 inches from the belly button twice a day about 12 hours apart, 8am and 8pm rotate sites. No warfarin.  7/5: Inject enoxaparin in the fatty tissue every 12 hours, 8am and 8pm. No warfarin.  7/6: Inject enoxaparin in the fatty tissue every 12 hours, 8am and 8pm. No warfarin.  7/7: Inject enoxaparin in the fatty tissue in the morning at 8 am (No PM dose). No warfarin.  7/8: Procedure Day - No enoxaparin - Resume warfarin in the evening or as directed by doctor (take an extra half tablet with usual dose for 2 days then resume normal dose).  7/9: Resume enoxaparin inject in the fatty tissue every 12 hours and take warfarin  7/10: Inject enoxaparin in the fatty tissue every 12 hours and take warfarin  7/11: Inject enoxaparin in the fatty tissue every 12 hours and take warfarin  7/12: Inject enoxaparin in the fatty tissue every 12 hours and take warfarin  7/13: Inject enoxaparin in the fatty tissue every 12 hours and take warfarin  7/14: warfarin appt to check INR.

## 2020-10-05 ENCOUNTER — Encounter: Payer: Self-pay | Admitting: *Deleted

## 2020-10-11 ENCOUNTER — Other Ambulatory Visit: Payer: Self-pay | Admitting: Cardiology

## 2020-10-13 ENCOUNTER — Telehealth: Payer: Self-pay

## 2020-10-13 ENCOUNTER — Telehealth: Payer: Self-pay | Admitting: *Deleted

## 2020-10-13 NOTE — Telephone Encounter (Signed)
Due to provider illness, called and confirmed Dr. Martinique will do cath tomorrow instead of Dr. Burt Knack since she has already been Lovenox bridged. She was grateful for call and agrees with plan.

## 2020-10-13 NOTE — Telephone Encounter (Signed)
Pt contacted pre-catheterization scheduled at [facility] for: Friday October 14, 2020 7:30 AM Verified arrival time and place: [place] [facility] [facility]) at: 5:30 AM   No solid food after midnight prior to cath, clear liquids until 5 AM day of procedure.  Hold: Coumadin-none 10/09/20 until post procedure Coumadin/Lovenox bridge-see 09/28/20  Anti-coag office note for detailed instructions. Lasix/KCl-AM of procedure  Except hold medications AM meds can be  taken pre-cath with sips of water including: aspirin 81 mg   Confirmed patient has responsible adult to drive home post procedure and be with patient first 24 hours after arriving home: yes  You are allowed ONE visitor in the waiting room during the time you are at the hospital for your procedure. Both you and your visitor must wear a mask once you enter the hospital.   Patient reports does not currently have any symptoms concerning for COVID-19 and no household members with COVID-19 like illness.      Reviewed procedure/mask/visitor instructions with patient.

## 2020-10-14 ENCOUNTER — Encounter (HOSPITAL_COMMUNITY): Payer: Self-pay | Admitting: Cardiology

## 2020-10-14 ENCOUNTER — Encounter (HOSPITAL_COMMUNITY)
Admission: RE | Disposition: A | Discharge status: Home / Self Care | Payer: Self-pay | Source: Home / Self Care | Attending: Cardiology

## 2020-10-14 ENCOUNTER — Ambulatory Visit (HOSPITAL_COMMUNITY)
Admission: RE | Admit: 2020-10-14 | Discharge: 2020-10-14 | Disposition: A | Discharge status: Home / Self Care | Payer: Medicare Other | Attending: Cardiology | Admitting: Cardiology

## 2020-10-14 DIAGNOSIS — Z885 Allergy status to narcotic agent status: Secondary | ICD-10-CM | POA: Insufficient documentation

## 2020-10-14 DIAGNOSIS — I480 Paroxysmal atrial fibrillation: Secondary | ICD-10-CM | POA: Diagnosis present

## 2020-10-14 DIAGNOSIS — G4733 Obstructive sleep apnea (adult) (pediatric): Secondary | ICD-10-CM | POA: Diagnosis present

## 2020-10-14 DIAGNOSIS — I272 Pulmonary hypertension, unspecified: Secondary | ICD-10-CM | POA: Insufficient documentation

## 2020-10-14 DIAGNOSIS — I5042 Chronic combined systolic (congestive) and diastolic (congestive) heart failure: Secondary | ICD-10-CM | POA: Diagnosis not present

## 2020-10-14 DIAGNOSIS — I4821 Permanent atrial fibrillation: Secondary | ICD-10-CM | POA: Diagnosis not present

## 2020-10-14 DIAGNOSIS — Z888 Allergy status to other drugs, medicaments and biological substances status: Secondary | ICD-10-CM | POA: Insufficient documentation

## 2020-10-14 DIAGNOSIS — I340 Nonrheumatic mitral (valve) insufficiency: Secondary | ICD-10-CM | POA: Diagnosis present

## 2020-10-14 DIAGNOSIS — I080 Rheumatic disorders of both mitral and aortic valves: Secondary | ICD-10-CM | POA: Diagnosis not present

## 2020-10-14 DIAGNOSIS — Z79890 Hormone replacement therapy (postmenopausal): Secondary | ICD-10-CM | POA: Insufficient documentation

## 2020-10-14 DIAGNOSIS — Z79899 Other long term (current) drug therapy: Secondary | ICD-10-CM | POA: Insufficient documentation

## 2020-10-14 DIAGNOSIS — I110 Hypertensive heart disease with heart failure: Secondary | ICD-10-CM | POA: Diagnosis not present

## 2020-10-14 DIAGNOSIS — Z7901 Long term (current) use of anticoagulants: Secondary | ICD-10-CM | POA: Insufficient documentation

## 2020-10-14 DIAGNOSIS — R0602 Shortness of breath: Secondary | ICD-10-CM | POA: Insufficient documentation

## 2020-10-14 DIAGNOSIS — Z952 Presence of prosthetic heart valve: Secondary | ICD-10-CM | POA: Insufficient documentation

## 2020-10-14 DIAGNOSIS — I5032 Chronic diastolic (congestive) heart failure: Secondary | ICD-10-CM | POA: Diagnosis present

## 2020-10-14 DIAGNOSIS — I10 Essential (primary) hypertension: Secondary | ICD-10-CM | POA: Diagnosis present

## 2020-10-14 HISTORY — PX: RIGHT/LEFT HEART CATH AND CORONARY ANGIOGRAPHY: CATH118266

## 2020-10-14 LAB — POCT I-STAT EG7
Acid-Base Excess: 1 mmol/L (ref 0.0–2.0)
Acid-Base Excess: 2 mmol/L (ref 0.0–2.0)
Acid-Base Excess: 2 mmol/L (ref 0.0–2.0)
Bicarbonate: 27.1 mmol/L (ref 20.0–28.0)
Bicarbonate: 27.2 mmol/L (ref 20.0–28.0)
Bicarbonate: 28.2 mmol/L — ABNORMAL HIGH (ref 20.0–28.0)
Calcium, Ion: 1.16 mmol/L (ref 1.15–1.40)
Calcium, Ion: 1.18 mmol/L (ref 1.15–1.40)
Calcium, Ion: 1.22 mmol/L (ref 1.15–1.40)
HCT: 31 % — ABNORMAL LOW (ref 36.0–46.0)
HCT: 31 % — ABNORMAL LOW (ref 36.0–46.0)
HCT: 31 % — ABNORMAL LOW (ref 36.0–46.0)
Hemoglobin: 10.5 g/dL — ABNORMAL LOW (ref 12.0–15.0)
Hemoglobin: 10.5 g/dL — ABNORMAL LOW (ref 12.0–15.0)
Hemoglobin: 10.5 g/dL — ABNORMAL LOW (ref 12.0–15.0)
O2 Saturation: 78 %
O2 Saturation: 79 %
O2 Saturation: 99 %
Potassium: 3.8 mmol/L (ref 3.5–5.1)
Potassium: 3.8 mmol/L (ref 3.5–5.1)
Potassium: 3.9 mmol/L (ref 3.5–5.1)
Sodium: 141 mmol/L (ref 135–145)
Sodium: 141 mmol/L (ref 135–145)
Sodium: 143 mmol/L (ref 135–145)
TCO2: 29 mmol/L (ref 22–32)
TCO2: 29 mmol/L (ref 22–32)
TCO2: 30 mmol/L (ref 22–32)
pCO2, Ven: 46.5 mmHg (ref 44.0–60.0)
pCO2, Ven: 50 mmHg (ref 44.0–60.0)
pCO2, Ven: 51 mmHg (ref 44.0–60.0)
pH, Ven: 7.342 (ref 7.250–7.430)
pH, Ven: 7.351 (ref 7.250–7.430)
pH, Ven: 7.374 (ref 7.250–7.430)
pO2, Ven: 166 mmHg — ABNORMAL HIGH (ref 32.0–45.0)
pO2, Ven: 45 mmHg (ref 32.0–45.0)
pO2, Ven: 46 mmHg — ABNORMAL HIGH (ref 32.0–45.0)

## 2020-10-14 LAB — PROTIME-INR
INR: 1 (ref 0.8–1.2)
Prothrombin Time: 13.5 seconds (ref 11.4–15.2)

## 2020-10-14 SURGERY — RIGHT/LEFT HEART CATH AND CORONARY ANGIOGRAPHY
Anesthesia: LOCAL

## 2020-10-14 MED ORDER — FENTANYL CITRATE (PF) 100 MCG/2ML IJ SOLN
INTRAMUSCULAR | Status: DC | PRN
  Administered 2020-10-14 (×2): 25 ug via INTRAVENOUS

## 2020-10-14 MED ORDER — ASPIRIN 81 MG PO CHEW
81.0000 mg | CHEWABLE_TABLET | ORAL | Status: AC
  Administered 2020-10-14: 81 mg via ORAL
  Filled 2020-10-14: qty 1

## 2020-10-14 MED ORDER — SODIUM CHLORIDE 0.9% FLUSH: 3.0000 mL | INTRAVENOUS | Status: DC | PRN

## 2020-10-14 MED ORDER — SODIUM CHLORIDE 0.9% FLUSH: 3.0000 mL | Freq: Two times a day (BID) | INTRAVENOUS | Status: DC

## 2020-10-14 MED ORDER — SODIUM CHLORIDE 0.9 % WEIGHT BASED INFUSION
3.0000 mL/kg/h | INTRAVENOUS | Status: AC
  Administered 2020-10-14: 3 mL/kg/h via INTRAVENOUS

## 2020-10-14 MED ORDER — HEPARIN SODIUM (PORCINE) 1000 UNIT/ML IJ SOLN
INTRAMUSCULAR | Status: DC | PRN
  Administered 2020-10-14: 5000 [IU] via INTRAVENOUS

## 2020-10-14 MED ORDER — SODIUM CHLORIDE 0.9 % IV SOLN: 250.0000 mL | INTRAVENOUS | Status: DC | PRN

## 2020-10-14 MED ORDER — SODIUM CHLORIDE 0.9 % WEIGHT BASED INFUSION: 1.0000 mL/kg/h | INTRAVENOUS | Status: DC

## 2020-10-14 MED ORDER — HEPARIN SODIUM (PORCINE) 1000 UNIT/ML IJ SOLN
INTRAMUSCULAR | Status: AC
  Filled 2020-10-14: qty 1

## 2020-10-14 MED ORDER — FENTANYL CITRATE (PF) 100 MCG/2ML IJ SOLN
INTRAMUSCULAR | Status: AC
  Filled 2020-10-14: qty 2

## 2020-10-14 MED ORDER — ACETAMINOPHEN 325 MG PO TABS: 650.0000 mg | ORAL_TABLET | ORAL | Status: DC | PRN

## 2020-10-14 MED ORDER — VERAPAMIL HCL 2.5 MG/ML IV SOLN
INTRAVENOUS | Status: AC
  Filled 2020-10-14: qty 2

## 2020-10-14 MED ORDER — HEPARIN (PORCINE) IN NACL 1000-0.9 UT/500ML-% IV SOLN
INTRAVENOUS | Status: AC
  Filled 2020-10-14: qty 500

## 2020-10-14 MED ORDER — MIDAZOLAM HCL 2 MG/2ML IJ SOLN
INTRAMUSCULAR | Status: DC | PRN
  Administered 2020-10-14 (×2): 1 mg via INTRAVENOUS

## 2020-10-14 MED ORDER — IOHEXOL 350 MG/ML SOLN
INTRAVENOUS | Status: DC | PRN
  Administered 2020-10-14: 35 mL

## 2020-10-14 MED ORDER — VERAPAMIL HCL 2.5 MG/ML IV SOLN
INTRAVENOUS | Status: DC | PRN
  Administered 2020-10-14: 10 mL via INTRA_ARTERIAL

## 2020-10-14 MED ORDER — LIDOCAINE HCL (PF) 1 % IJ SOLN
INTRAMUSCULAR | Status: DC | PRN
  Administered 2020-10-14: 4 mL

## 2020-10-14 MED ORDER — ONDANSETRON HCL 4 MG/2ML IJ SOLN: 4.0000 mg | Freq: Four times a day (QID) | INTRAMUSCULAR | Status: DC | PRN

## 2020-10-14 MED ORDER — MIDAZOLAM HCL 2 MG/2ML IJ SOLN
INTRAMUSCULAR | Status: AC
  Filled 2020-10-14: qty 2

## 2020-10-14 MED ORDER — HEPARIN (PORCINE) IN NACL 1000-0.9 UT/500ML-% IV SOLN
INTRAVENOUS | Status: DC | PRN
  Administered 2020-10-14 (×2): 500 mL

## 2020-10-14 SURGICAL SUPPLY — 11 items
CATH 5FR JL3.5 JR4 ANG PIG MP (CATHETERS) ×2 IMPLANT
CATH BALLN WEDGE 5F 110CM (CATHETERS) ×2 IMPLANT
DEVICE RAD COMP TR BAND LRG (VASCULAR PRODUCTS) ×2 IMPLANT
GLIDESHEATH SLEND SS 6F .021 (SHEATH) ×2 IMPLANT
GUIDEWIRE .025 260CM (WIRE) ×2 IMPLANT
KIT HEART LEFT (KITS) ×2 IMPLANT
PACK CARDIAC CATHETERIZATION (CUSTOM PROCEDURE TRAY) ×2 IMPLANT
SHEATH GLIDE SLENDER 4/5FR (SHEATH) ×2 IMPLANT
TRANSDUCER W/STOPCOCK (MISCELLANEOUS) ×2 IMPLANT
TUBING CIL FLEX 10 FLL-RA (TUBING) ×2 IMPLANT
WIRE HI TORQ VERSACORE J 260CM (WIRE) ×2 IMPLANT

## 2020-10-14 NOTE — Interval H&P Note (Signed)
History and Physical Interval Note:  10/14/2020 7:18 AM  [name]  has presented today for surgery, with the diagnosis of mr.  The various methods of treatment have been discussed with the patient and family. After consideration of risks, benefits and other options for treatment, the patient has consented to  Procedure(s): RIGHT/LEFT HEART CATH AND CORONARY ANGIOGRAPHY (N/A) as a surgical intervention.  The patient's history has been reviewed, patient examined, no change in status, stable for surgery.  I have reviewed the patient's chart and labs.  Questions were answered to the patient's satisfaction.     Collier [facility] 10/14/2020 7:19 AM

## 2020-10-14 NOTE — Progress Notes (Signed)
Discharge instructions reviewed with pt and her husband. Both voice understanding.  

## 2020-10-14 NOTE — Progress Notes (Signed)
Ambulated to the bathroom to void in the hallway. Tol well

## 2020-10-14 NOTE — Discharge Instructions (Addendum)
Resume Lovenox as instructed by pharmacy. Can resume Coumadin today   Radial Site Care  This sheet gives you information about how to care for yourself after your procedure. Your health care provider may also give you more specific instructions. If you have problems or questions, contact your health care provider. What can I expect after the procedure? After the procedure, it is common to have: Bruising and tenderness at the catheter insertion area. Follow these instructions at home: Medicines Take over-the-counter and prescription medicines only as told by your health care provider. Insertion site care Follow instructions from your health care provider about how to take care of your insertion site. Make sure you: Wash your hands with soap and water before you remove your bandage (dressing). If soap and water are not available, use hand sanitizer. May remove dressing in 24 hours. Check your insertion site every day for signs of infection. Check for: Redness, swelling, or pain. Fluid or blood. Pus or a bad smell. Warmth. Do no take baths, swim, or use a hot tub for 5 days. You may shower 24-48 hours after the procedure. Remove the dressing and gently wash the site with plain soap and water. Pat the area dry with a clean towel. Do not rub the site. That could cause bleeding. Do not apply powder or lotion to the site. Activity  For 24 hours after the procedure, or as directed by your health care provider: Do not flex or bend the affected arm. Do not push or pull heavy objects with the affected arm. Do not drive yourself home from the hospital or clinic. You may drive 24 hours after the procedure. Do not operate machinery or power tools. KEEP ARM ELEVATED THE REMAINDER OF THE DAY. Do not push, pull or lift anything that is heavier than 10 lb for 5 days. Ask your health care provider when it is okay to: Return to work or school. Resume usual physical activities or sports. Resume  sexual activity. General instructions If the catheter site starts to bleed, raise your arm and put firm pressure on the site. If the bleeding does not stop, get help right away. This is a medical emergency. DRINK PLENTY OF FLUIDS FOR THE NEXT 2-3 DAYS. No alcohol consumption for 24 hours after receiving sedation. If you went home on the same day as your procedure, a responsible adult should be with you for the first 24 hours after you arrive home. Keep all follow-up visits as told by your health care provider. This is important. Contact a health care provider if: You have a fever. You have redness, swelling, or yellow drainage around your insertion site. Get help right away if: You have unusual pain at the radial site. The catheter insertion area swells very fast. The insertion area is bleeding, and the bleeding does not stop when you hold steady pressure on the area. Your arm or hand becomes pale, cool, tingly, or numb. These symptoms may represent a serious problem that is an emergency. Do not wait to see if the symptoms will go away. Get medical help right away. Call your local emergency services (911 in the [place] Do not drive yourself to the hospital. Summary After the procedure, it is common to have bruising and tenderness at the site. Follow instructions from your health care provider about how to take care of your radial site wound. Check the wound every day for signs of infection.  This information is not intended to replace advice given to you by your  health care provider. Make sure you discuss any questions you have with your health care provider. Document Revised: 05/01/2017 Document Reviewed: 05/01/2017 Elsevier Patient Education  2020 [employer].

## 2020-10-20 ENCOUNTER — Other Ambulatory Visit: Payer: Self-pay

## 2020-10-20 ENCOUNTER — Ambulatory Visit (INDEPENDENT_AMBULATORY_CARE_PROVIDER_SITE_OTHER): Payer: Medicare Other | Admitting: Pharmacist

## 2020-10-20 DIAGNOSIS — I480 Paroxysmal atrial fibrillation: Secondary | ICD-10-CM

## 2020-10-20 DIAGNOSIS — I4819 Other persistent atrial fibrillation: Secondary | ICD-10-CM | POA: Diagnosis not present

## 2020-10-20 DIAGNOSIS — Z5181 Encounter for therapeutic drug level monitoring: Secondary | ICD-10-CM | POA: Diagnosis not present

## 2020-10-20 LAB — POCT INR: INR: 1.6 — AB (ref 2.0–3.0)

## 2020-10-25 DIAGNOSIS — Z20822 Contact with and (suspected) exposure to covid-19: Secondary | ICD-10-CM | POA: Diagnosis not present

## 2020-10-26 ENCOUNTER — Ambulatory Visit (INDEPENDENT_AMBULATORY_CARE_PROVIDER_SITE_OTHER): Payer: Medicare Other

## 2020-10-26 ENCOUNTER — Ambulatory Visit (INDEPENDENT_AMBULATORY_CARE_PROVIDER_SITE_OTHER): Payer: Medicare Other | Admitting: Cardiovascular Disease

## 2020-10-26 ENCOUNTER — Encounter: Payer: Self-pay | Admitting: Cardiovascular Disease

## 2020-10-26 ENCOUNTER — Other Ambulatory Visit: Payer: Self-pay

## 2020-10-26 ENCOUNTER — Ambulatory Visit: Payer: Medicare Other | Admitting: Cardiovascular Disease

## 2020-10-26 VITALS — BP 122/62 | HR 61 | Ht 67.0 in | Wt 219.0 lb

## 2020-10-26 DIAGNOSIS — Z7901 Long term (current) use of anticoagulants: Secondary | ICD-10-CM | POA: Diagnosis not present

## 2020-10-26 DIAGNOSIS — I5022 Chronic systolic (congestive) heart failure: Secondary | ICD-10-CM | POA: Diagnosis not present

## 2020-10-26 DIAGNOSIS — Z5181 Encounter for therapeutic drug level monitoring: Secondary | ICD-10-CM | POA: Diagnosis not present

## 2020-10-26 DIAGNOSIS — I340 Nonrheumatic mitral (valve) insufficiency: Secondary | ICD-10-CM

## 2020-10-26 DIAGNOSIS — Z952 Presence of prosthetic heart valve: Secondary | ICD-10-CM

## 2020-10-26 DIAGNOSIS — I480 Paroxysmal atrial fibrillation: Secondary | ICD-10-CM

## 2020-10-26 DIAGNOSIS — I4819 Other persistent atrial fibrillation: Secondary | ICD-10-CM | POA: Diagnosis not present

## 2020-10-26 DIAGNOSIS — G4733 Obstructive sleep apnea (adult) (pediatric): Secondary | ICD-10-CM | POA: Diagnosis not present

## 2020-10-26 DIAGNOSIS — J984 Other disorders of lung: Secondary | ICD-10-CM | POA: Diagnosis not present

## 2020-10-26 DIAGNOSIS — I4821 Permanent atrial fibrillation: Secondary | ICD-10-CM

## 2020-10-26 DIAGNOSIS — I77810 Thoracic aortic ectasia: Secondary | ICD-10-CM

## 2020-10-26 LAB — POCT INR: INR: 2 (ref 2.0–3.0)

## 2020-10-26 NOTE — Patient Instructions (Signed)
Medication Instructions:  No changes *If you need a refill on your cardiac medications before your next appointment, please call your pharmacy*   Lab Work: None ordered If you have labs (blood work) drawn today and your tests are completely normal, you will receive your results only by: [place] (if you have MyChart) OR A paper copy in the mail If you have any lab test that is abnormal or we need to change your treatment, we will call you to review the results.   Testing/Procedures: Your physician has requested that you have an echocardiogram in November. Echocardiography is a painless test that uses sound waves to create images of your heart. It provides your doctor with information about the size and shape of your heart and how well your heart's chambers and valves are working. You may receive an ultrasound enhancing agent through an IV if needed to better visualize your heart during the echo.This procedure takes approximately one hour. There are no restrictions for this procedure. This will take place at the [address], [address].   Follow-Up: At [facility], you and your health needs are our priority.  As part of our continuing mission to provide you with exceptional heart care, we have created designated Provider Care Teams.  These Care Teams include your primary Cardiologist (physician) and Advanced Practice Providers (APPs -  Physician Assistants and Nurse Practitioners) who all work together to provide you with the care you need, when you need it.  We recommend signing up for the patient portal called "MyChart".  Sign up information is provided on this After Visit Summary.  MyChart is used to connect with patients for Virtual Visits (Telemedicine).  Patients are able to view lab/test results, encounter notes, upcoming appointments, etc.  Non-urgent messages can be sent to your provider as well.   To learn more about what you can do with MyChart, go to  [email or web address].    Your next appointment:   4 month(s) after the echo  The format for your next appointment:   In Person  Provider:   Sanda Klein, MD   Other Instructions Please send [place] your [place] readings by MyChart after one week of recordings.

## 2020-10-26 NOTE — Patient Instructions (Addendum)
continue taking 1 tablet daily except 2 tablets on Monday, Wednesday, and Friday.* Repeat INR in 4 weeks. [facility] [phone]

## 2020-10-26 NOTE — Progress Notes (Addendum)
Cardiology Office Note:    Date:  10/27/2020   ID:  [name], DOB [birth year], MRN [id number]  PCP:  Maurice Small, MD  [place] Cardiologist:  Sanda Klein, MD  [place] Electrophysiologist:  None   Referring MD: Maurice Small, MD   No chief complaint on file.   History of Present Illness:    [name] is a 75 y.o. female with a hx of numerous cardiac problems including chronic combined systolic and diastolic heart failure, mitral insufficiency, history of mechanical aortic valve prosthesis (2006, St Jude), permanent atrial fibrillation with history of ablation x2 (most recent 2017) and failure to maintain sinus rhythm on dofetilide and amiodarone, OSA on CPAP, history of hemidiaphragm paralysis following heart surgery, mild aneurysmal ascending aorta (49 mm by June 2021 echo), normal coronary arteries by remote catheterization 2005, a couple of serious bleeding complications including subdural hematoma and 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  The cause of LVEF decline was uncertain, but she responded well to discontinuation of diltiazem and treatment with diuretics, Entresto, carvedilol (the doses have been limited by low blood pressure).  In the past she reportedly did not tolerate beta-blockers due to depression, but the current low-dose of carvedilol seems to be working well for her. Follow-up echocardiogram performed in October 2021 showed only partial improvement in LVEF now up to 40-45% in the left ventricle remain dilated with an end-systolic diameter of 49 mm.  Mitral regurgitation remains severe with a central jet.  She  underwent a TEE on 07/12/2020 that shows further improvement in LVEF which is now almost normal at 50-55%.  It also showed that the mitral regurgitation is central and squarely in the moderate to severe range (there was no reversal of flow in the pulmonary veins, all calculated parameters suggested higher and of moderate MR).  Anatomically, the valve appears appropriate for MitraClip, without major leaflet retraction and with a mean gradient of 3 mmHg at a heart rate around 100 bpm.  After initial marked improvement she had noticed some deterioration of functional status, occasionally NYHA class III.  She underwent right and left heart catheterization on 10/14/2020.  This showed excellent hemodynamic values and normal pulmonary artery pressure.  The PAWP was only 10 mmHg and the V wave was not prominent.  She returns to discuss her symptoms in view of this new data.  She continues to complain of exertional fatigue and dyspnea.  Some of her symptoms are suggestive of possible chronotropic incompetence.  Her heart rate is 60 bpm at rest.  She has a Fitbit but has not been using it.  Pulmonary function test performed in May 2021 were abnormal with restrictive findings: FEV1 of 39% of predicted, commensurate FVC 40% of predicted.  She was last seen by her pulmonary specialist, Dr. Ander Slade in October 2021.  She reports compliance with CPAP.  She does not have edema,  dizziness, hypertension,  Syncope, palpitations, focal neurological complaints. INR is back in therapeutic range after her cardiac catheterization and resuming Coumadin.  Has a few tender spots where she self administered enoxaparin in the abdominal wall.  She has not had any recent bleeding problems and denies falls.   Past Medical History:  Diagnosis  Date   Anxiety    Aortic stenosis    status post aortic valve replacement with St. 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   60mm by echo 06/2016 and 30mm by CT  angin 2016   Chronic anticoagulation    Complication of anesthesia    pt states paralyzed diaphragm after AVR   Depression    DJD (degenerative joint disease) of knee    Dyslipidemia    GERD (gastroesophageal reflux disease)    "several years ago; none since" (11/12/2012)   Hyperlipidemia    Hyperlipidemia LDL goal <70 01/19/2017   Hypertension    Left atrial enlargement    Mitral regurgitation 06/25/2016   Mild moderate MR by echo 06/2016   Obesity    Persistent atrial fibrillation (HCC)    s/p afib ablation x 2 at [facility]   Sleep apnea    On CPAP at 12cm H2O   Subdural hematoma ([place])    in setting of INR greater than 2.2 shortly after AVR - now cleared by neurosurgery to maintain INR 2-2.5    Past Surgical History:  Procedure Laterality Date   BURR HOLE FOR SUBDURAL HEMATOMA  2006   CARDIAC VALVE REPLACEMENT  2006   St. Jude AVR   CARDIOVERSION N/A 07/22/2012   Procedure: CARDIOVERSION;  Surgeon: [name], MD;  Location: [facility] ENDOSCOPY;  Service: Cardiovascular;  Laterality: N/A;   CARDIOVERSION N/A 11/25/2012   Procedure: CARDIOVERSION;  Surgeon: [name], MD;  Location: [place] ENDOSCOPY;  Service: Cardiovascular;  Laterality: N/A;   CARDIOVERSION N/A 12/30/2012   Procedure: CARDIOVERSION;  Surgeon: [name], MD;  Location: [place] ENDOSCOPY;  Service: Cardiovascular;  Laterality: N/A;  h&p in file-HW    CARDIOVERSION N/A 03/30/2013   Procedure: CARDIOVERSION;  Surgeon: [name], MD;  Location: [place] ENDOSCOPY;  Service: Cardiovascular;  Laterality: N/A;   ELECTROPHYSIOLOGIC STUDY  11/2013   Afib ablation x 2 (11/2013 and 10/2015) at [facility] by Dr Clyda Hurdle with recurrence post ablation   KNEE ARTHROSCOPY Left 1980's   "2" (11/12/2012)   KNEE SURGERY Left 1980's   "after 2 scopes they went in and did some kind of OR" (11/12/2012)   RIGHT/LEFT HEART CATH AND CORONARY ANGIOGRAPHY N/A 10/14/2020   Procedure: RIGHT/LEFT HEART CATH AND CORONARY ANGIOGRAPHY;  Surgeon: Martinique, Peter M, MD;   Location: [place] CV LAB;  Service: Cardiovascular;  Laterality: N/A;   TEE WITHOUT CARDIOVERSION N/A 07/22/2012   Procedure: TRANSESOPHAGEAL ECHOCARDIOGRAM (TEE);  Surgeon: [name], MD;  Location: [facility] ENDOSCOPY;  Service: Cardiovascular;  Laterality: N/A;  Rm 2034   TEE WITHOUT CARDIOVERSION N/A 10/07/2013   Procedure: TRANSESOPHAGEAL ECHOCARDIOGRAM (TEE);  Surgeon: [name], MD;  Location: [facility] ENDOSCOPY;  Service: Cardiovascular;  Laterality: N/A;   TEE WITHOUT CARDIOVERSION N/A 07/12/2020   Procedure: TRANSESOPHAGEAL ECHOCARDIOGRAM (TEE);  Surgeon: Sanda Klein, MD;  Location: [place];  Service: Cardiovascular;  Laterality: N/A;   TOTAL KNEE ARTHROPLASTY Left 11/05/2014   Procedure: TOTAL KNEE ARTHROPLASTY;  Surgeon: Dorna Leitz, MD;  Location: [place];  Service: Orthopedics;  Laterality: Left;   TUBAL LIGATION  1980's    Current Medications: Current Meds  Medication Sig   acetaminophen (TYLENOL) 500 MG tablet Take 1,000 mg by mouth every 6 (six) hours as needed (pain).   ALPRAZolam (XANAX) 0.5 MG tablet Take 0.5 mg by mouth 2 (two) times daily as needed for anxiety.    calcium-vitamin D (OSCAL WITH D) 500-200 MG-UNIT per tablet Take 2 tablets by  mouth in the morning.   carvedilol (COREG) 3.125 MG tablet TAKE 1 TABLET(3.125 MG) BY MOUTH TWICE DAILY WITH A MEAL   Cholecalciferol (VITAMIN D) 2000 UNITS tablet Take 4,000 Units by mouth in the morning.   famotidine (PEPCID) 10 MG tablet Take 10 mg by mouth 2 (two) times daily as needed for heartburn or indigestion.   ferrous sulfate 325 (65 FE) MG tablet Take 325 mg by mouth in the morning.   furosemide (LASIX) 40 MG tablet Take 1 tablet (40 mg total) by mouth 2 (two) times daily. For weights above 212 pounds, take 80mg  in the morning and 40mg  in the evening.   levothyroxine (SYNTHROID, LEVOTHROID) 100 MCG tablet Take 100 mcg daily before breakfast by mouth.   Multiple Vitamin (MULTIVITAMIN WITH MINERALS) TABS Take 1 tablet by mouth  in the morning.   oxymetazoline (AFRIN) 0.05 % nasal spray Place 1 spray into both nostrils daily as needed for congestion.   potassium chloride SA (KLOR-CON) 20 MEQ tablet TAKE 2 TABLETS(40 MEQ) BY MOUTH DAILY   pravastatin (PRAVACHOL) 20 MG tablet Take 1 tablet (20 mg total) by mouth every evening.   sacubitril-valsartan (ENTRESTO) 97-103 MG Take 1 tablet by mouth 2 (two) times daily.   sertraline (ZOLOFT) 100 MG tablet Take 100 mg by mouth in the morning.   traMADol (ULTRAM) 50 MG tablet Take 50 mg by mouth every 12 (twelve) hours as needed for severe pain.   tretinoin (RETIN-A) 0.05 % cream Apply 1 application topically at bedtime. Applied to face   warfarin (COUMADIN) 4 MG tablet Take 4-8 mg by mouth daily. Take 1 tablet (4 mg) by mouth on Sundays, Tuesdays, Thursdays & Saturdays in the evening Take 2 tablets (8 mg) by mouth on Mondays, Wednesdays & Fridays in the evening.     Allergies:   Codeine, Metoprolol, Propoxyphene, and Dilantin [phenytoin sodium extended]   Social History   Socioeconomic History   Marital status: Married    Spouse name: Not on file   Number of children: Not on file   Years of education: Not on file   Highest education level: Not on file  Occupational History   Not on file  Tobacco Use   Smoking status: Never   Smokeless tobacco: Never  Substance and Sexual Activity   Alcohol use: No    Comment: 11/12/2012 "no alcohol in the last 9 years"   Drug use: No   Sexual activity: Not Currently  Other Topics Concern   Not on file  Social History Narrative   Pt lives in [place] with spouse.  Retired   Investment banker, operational of Radio broadcast assistant Strain: Not on [employer] Insecurity: Not on file  Transportation Needs: Not on file  Physical Activity: Not on file  Stress: Not on file  Social Connections: Not on file     Family History: The patient's family history includes Heart disease in her brother; Hyperlipidemia in her father; Hypertension  in her father and mother; Lung cancer in her mother.  ROS:   Please see the history of present illness.     All other systems reviewed and are negative.  EKGs/Labs/Other Studies Reviewed:    The following studies were reviewed today: TTE 01-22-20 1. Systolic Index of contractility has improved from 669 to 865 mmHg/s.Marland Kitchen  Left ventricular ejection fraction, by estimation, is 35 to 40%. Left  ventricular ejection fraction by PLAX is 40 %. The left ventricle has  moderately decreased function. The left  ventricle demonstrates global hypokinesis. The left ventricular internal  cavity size was mildly to moderately dilated. There is mild concentric  left ventricular hypertrophy. Left ventricular diastolic parameters are  indeterminate.   2. Right ventricular systolic function is moderately reduced. The right  ventricular size is moderately enlarged. There is moderately elevated  pulmonary artery systolic pressure. The estimated right ventricular  systolic pressure is 83.3 mmHg.   3. Left atrial size was severely dilated.   4. Right atrial size was severely dilated.   5. Central mitral regurgitation; suspect atrial dilation contributes to  mechanism . The mitral valve is grossly normal. Severe mitral valve  regurgitation.   6. Prosthetic DVI 0.33, AT < 100 ms. The aortic valve was not well  visualized. Aortic valve regurgitation is not visualized. There is a 21 mm  St. Jude St. Jude Reagent mechanical valve present in the aortic position.  Procedure Date: 2006.   7. Aortic dilatation noted. There is mild dilatation of the aortic root,  measuring 40 mm. There is moderate dilatation of the ascending aorta,  measuring 47 mm.   8. The inferior vena cava is normal in size with <50% respiratory  variability, suggesting right atrial pressure of 8 mmHg.   TEE 07/12/2020  1. Left ventricular ejection fraction, by estimation, is 50 to 55%. The  left ventricle has low normal function. The left  ventricle demonstrates  global hypokinesis. Left ventricular diastolic function could not be  evaluated.   2. Left atrial size was severely dilated. No left atrial/left atrial  appendage thrombus was detected. The LAA emptying velocity was 50 cm/s.  (the rhythm was atrial flutter).   3. Right atrial size was severely dilated.   4. The mitral valve is rheumatic. Moderate to severe mitral valve  regurgitation. No evidence of mitral stenosis. The mean mitral valve  gradient is 3.0 mmHg.   5. Tricuspid valve regurgitation is moderate.   6. The aortic valve has been repaired/replaced. Aortic valve  regurgitation is trivial. No aortic stenosis is present. There is a 19 mm  mechanical valve present in the aortic position. Echo findings are  consistent with normal structure and function of  the aortic valve prosthesis. Aortic valve mean gradient measures 3.0 mmHg.  Aortic valve Vmax measures 1.09 m/s.   7. Aortic dilatation noted. There is mild dilatation of the ascending  aorta, measuring 40 mm. There is Moderate (Grade III) atheroma plaque  involving the descending aorta.   8. Evidence of atrial level shunting detected by color flow Doppler.  There is a small patent foramen ovale with predominantly left to right  shunting across the atrial septum.   9. Right ventricular systolic function is normal. The right ventricular  size is normal. There is mildly elevated pulmonary artery systolic  pressure.   Cardiac cath 10/12/2020 1. Normal coronary anatomy 2. Borderline pulmonary HTN. Mean PAP 22 mmHg 3. Normal PCWP mean 10 mm Hg. V wave does not appear prominent 4. Normal cardiac output.   Plan: resume anticoagulation. Continue medical therapy.  Fick Cardiac Output 8.73 L/min  Fick Cardiac Output Index 4.18 (L/min)/BSA  RA A Wave 5 mmHg  RA V Wave 6 mmHg  RA Mean 3 mmHg  RV Systolic Pressure 42 mmHg  RV Diastolic Pressure 0 mmHg  RV EDP 5 mmHg  PA Systolic Pressure 41 mmHg  PA  Diastolic Pressure 11 mmHg  PA Mean 22 mmHg  PW A Wave 12 mmHg  PW V Wave 17 mmHg  PW Mean  10 mmHg  AO Systolic Pressure 865 mmHg  AO Diastolic Pressure 49 mmHg  AO Mean 76 mmHg  QP/QS 1  TPVR Index 5.26 HRUI  TSVR Index 18.19 HRUI  PVR SVR Ratio 0.16  TPVR/TSVR Ratio 0.29    EKG:  EKG is not ordered today.  Last ECG shows atrial fibrillation with right bundle branch block and T wave inversion V4-V6  Recent Labs: 08/14/2020: ALT 19 08/15/2020: B Natriuretic Peptide 598.4; Magnesium 2.1; TSH 4.836 09/28/2020: BUN 30; Creatinine, Ser 0.88; Platelets 207 10/14/2020: Hemoglobin 10.5; Potassium 3.8; Sodium 143  Recent Lipid Panel    Component Value Date/Time   CHOL 175 05/27/2018 1125   TRIG 100 05/27/2018 1125   HDL 71 05/27/2018 1125   CHOLHDL 2.5 05/27/2018 1125   LDLCALC 84 05/27/2018 1125    Physical Exam:    VS:  BP 122/62 (BP Location: Left Arm, Patient Position: Sitting, Cuff Size: Large)   Pulse 61   Ht 5\' 7"  (1.702 m)   Wt 219 lb (99.3 kg)   SpO2 98%   BMI 34.30 kg/m     Wt Readings from Last 3 Encounters:  10/26/20 219 lb (99.3 kg)  10/14/20 215 lb (97.5 kg)  09/14/20 218 lb 9.6 oz (99.2 kg)      General: Alert, oriented x3, no distress, moderately obese Head: no evidence of trauma, PERRL, EOMI, no exophtalmos or lid lag, no myxedema, no xanthelasma; normal ears, nose and oropharynx Neck: normal jugular venous pulsations and no hepatojugular reflux; brisk carotid pulses without delay and no carotid bruits Chest: clear to auscultation, no signs of consolidation by percussion or palpation, normal fremitus, symmetrical and full respiratory excursions Cardiovascular: normal position and quality of the apical impulse, regular rhythm, normal first and second heart sounds, crisp prosthetic valve clicks, 2/6 aortic ejection murmur is early peaking, 2/6 apical holosystolic murmur with a remarkably limited radiation area, no diastolic murmurs, rubs or gallops, rubs or  gallops Abdomen: no tenderness or distention, no masses by palpation, no abnormal pulsatility or arterial bruits, normal bowel sounds, no hepatosplenomegaly.  Ecchymoses at site of enoxaparin administration. Extremities: no clubbing, cyanosis or edema; 2+ radial, ulnar and brachial pulses bilaterally; 2+ right femoral, posterior tibial and dorsalis pedis pulses; 2+ left femoral, posterior tibial and dorsalis pedis pulses; no subclavian or femoral bruits Neurological: grossly nonfocal Psych: Normal mood and affect    ASSESSMENT:    1. Chronic systolic heart failure ([place])   2. Nonrheumatic mitral (valve) insufficiency   3. Permanent atrial fibrillation ([place])   4. H/O mechanical aortic valve replacement   5. Long term current use of anticoagulant   6. OSA (obstructive sleep apnea)   7. Chronic restrictive lung disease   8. Ascending aorta dilatation (HCC)     PLAN:    In order of problems listed above:  CHF: Excellent hemodynamic parameters at the time of the right heart catheterization 10/14/2020 when she weighed 215 pounds.  She is only slightly above that weight today.  Do not think that heart failure is the reason for her impaired functional status.  Consider chronotropic incompetence.  Asked her to wear her Fitbit.  We could always stop her carvedilol, although she was very pleased by the improvement in her LV systolic function after starting guideline directed medical therapy.  Talked a little bit more about possible treatment for chronotropic incompetence, including potential need for pacemaker therapy (but carvedilol should be discontinued before we decide on that).  The exact mechanism of the reduction  in LVEF is unclear, since her atrial fibrillation appears to be well rate controlled and she does not have any signs or symptoms of coronary disease or any perfusion abnormalities on the nuclear stress test from May 2021.   MR: Although there is clear improvement in her cardiomyopathy,  the MR remains moderate to severe.  The recent hemodynamic assessment by catheterization suggest that she does not require treatment for this disorder at this time, but we will continue monitoring mitral regurgitation with serial echocardiography.  Anatomically, she does have appropriate findings for MitraClip should this become necessary. AFib: Well rate controlled today.  Asked her to wear her Fitbit and call me if her heart rate does not achieve 100 bpm during more intense physical exertion.  May need to stop carvedilol if that is the case.  Consider a Holter monitor if this is the case.  She reports increased heart rate at night which is likely an expression of hypervolemia.  On warfarin anticoagulation for mechanical heart valve.  Severely dilated left and right atrium.  She has had 2 previous ablation procedures and has failed antiarrhythmic therapy with amiodarone and dofetilide.  Continue to manage this with rate control.  Occasionally her rhythm on the ECG appears remarkably regular suggesting there may be a junctional rhythm competing with AV conduction. Mech AVR: Normal gradients across the mechanical prosthesis (mean gradient 10 mmHg for size 19 St. Jude Regent dual leaflet). Anticoagulation: No problems during transition from enoxaparin back to Coumadin, but she does have a few tender ecchymoses on her abdomen.  She does have a history of serious bleeding complications on 2 occasions (subdural hematoma 2006 and rectus sheath tumor 2019). OSA: She reports 100% compliance with CPAP.  She does not have daytime hypersomnolence. Restrictive lung disease: In part of her dyspnea.  Fairly significant reduction in FVC, at least in part attributable to obesity.  Follow-up with Dr. Ander Slade. Asc Ao dilation: CT scan in 2017 documented an ascending aorta diameter 4.3 cm, most recent transthoracic echo measured this at 4.7 cm.   The TEE did not confirm that, measuring a maximum diameter of only 4.0 cm.   Consider follow-up with a CT scan next year.   Medication Adjustments/Labs and Tests Ordered: Current medicines are reviewed at length with the patient today.  Concerns regarding medicines are outlined above.  Orders Placed This Encounter  Procedures   ECHOCARDIOGRAM COMPLETE    No orders of the defined types were placed in this encounter.   Patient Instructions  Medication Instructions:  No changes *If you need a refill on your cardiac medications before your next appointment, please call your pharmacy*   Lab Work: None ordered If you have labs (blood work) drawn today and your tests are completely normal, you will receive your results only by: [place] (if you have MyChart) OR A paper copy in the mail If you have any lab test that is abnormal or we need to change your treatment, we will call you to review the results.   Testing/Procedures: Your physician has requested that you have an echocardiogram in November. Echocardiography is a painless test that uses sound waves to create images of your heart. It provides your doctor with information about the size and shape of your heart and how well your heart's chambers and valves are working. You may receive an ultrasound enhancing agent through an IV if needed to better visualize your heart during the echo.This procedure takes approximately one hour. There are no restrictions for this  procedure. This will take place at the [address], [address].   Follow-Up: At [facility], you and your health needs are our priority.  As part of our continuing mission to provide you with exceptional heart care, we have created designated Provider Care Teams.  These Care Teams include your primary Cardiologist (physician) and Advanced Practice Providers (APPs -  Physician Assistants and Nurse Practitioners) who all work together to provide you with the care you need, when you need it.  We recommend signing up for the patient portal  called "MyChart".  Sign up information is provided on this After Visit Summary.  MyChart is used to connect with patients for Virtual Visits (Telemedicine).  Patients are able to view lab/test results, encounter notes, upcoming appointments, etc.  Non-urgent messages can be sent to your provider as well.   To learn more about what you can do with MyChart, go to [email or web address].    Your next appointment:   4 month(s) after the echo  The format for your next appointment:   In Person  Provider:   Sanda Klein, MD   Other Instructions Please send [place] your [place] readings by MyChart after one week of recordings.   Signed, Sanda Klein, MD  10/27/2020 1:52 PM    [place]

## 2020-11-03 ENCOUNTER — Other Ambulatory Visit: Payer: Self-pay | Admitting: Cardiology

## 2020-11-03 ENCOUNTER — Other Ambulatory Visit: Payer: Self-pay | Admitting: Cardiovascular Disease

## 2020-11-23 ENCOUNTER — Other Ambulatory Visit: Payer: Self-pay

## 2020-11-23 ENCOUNTER — Ambulatory Visit (INDEPENDENT_AMBULATORY_CARE_PROVIDER_SITE_OTHER): Payer: Medicare Other

## 2020-11-23 DIAGNOSIS — Z5181 Encounter for therapeutic drug level monitoring: Secondary | ICD-10-CM

## 2020-11-23 DIAGNOSIS — I4819 Other persistent atrial fibrillation: Secondary | ICD-10-CM

## 2020-11-23 DIAGNOSIS — I480 Paroxysmal atrial fibrillation: Secondary | ICD-10-CM | POA: Diagnosis not present

## 2020-11-23 LAB — POCT INR: INR: 2.1 (ref 2.0–3.0)

## 2020-11-23 NOTE — Patient Instructions (Signed)
continue taking 1 tablet daily except 2 tablets on Monday, Wednesday, and Friday.* Repeat INR in 4 weeks. [facility] [phone]

## 2020-11-30 ENCOUNTER — Ambulatory Visit: Payer: Medicare Other | Admitting: Cardiovascular Disease

## 2020-12-05 ENCOUNTER — Other Ambulatory Visit: Payer: Self-pay | Admitting: Cardiovascular Disease

## 2020-12-13 NOTE — Telephone Encounter (Signed)
Left detailed message on name-verified VM with med change per MD and recommendations to send HR chart. Med list updated.

## 2020-12-21 ENCOUNTER — Other Ambulatory Visit: Payer: Self-pay

## 2020-12-21 ENCOUNTER — Ambulatory Visit (INDEPENDENT_AMBULATORY_CARE_PROVIDER_SITE_OTHER): Payer: Medicare Other

## 2020-12-21 DIAGNOSIS — Z5181 Encounter for therapeutic drug level monitoring: Secondary | ICD-10-CM

## 2020-12-21 DIAGNOSIS — I4819 Other persistent atrial fibrillation: Secondary | ICD-10-CM

## 2020-12-21 DIAGNOSIS — I480 Paroxysmal atrial fibrillation: Secondary | ICD-10-CM

## 2020-12-21 LAB — POCT INR: INR: 1.9 — AB (ref 2.0–3.0)

## 2020-12-21 NOTE — Patient Instructions (Signed)
Take 2.5 tablets today only and then  continue taking 1 tablet daily except 2 tablets on Monday, Wednesday, and Friday.* Repeat INR in 4 weeks. [facility] [phone]

## 2020-12-27 DIAGNOSIS — E039 Hypothyroidism, unspecified: Secondary | ICD-10-CM | POA: Diagnosis not present

## 2020-12-27 DIAGNOSIS — F330 Major depressive disorder, recurrent, mild: Secondary | ICD-10-CM | POA: Diagnosis not present

## 2020-12-27 DIAGNOSIS — E2839 Other primary ovarian failure: Secondary | ICD-10-CM | POA: Diagnosis not present

## 2020-12-27 DIAGNOSIS — Z5181 Encounter for therapeutic drug level monitoring: Secondary | ICD-10-CM | POA: Diagnosis not present

## 2020-12-27 DIAGNOSIS — E785 Hyperlipidemia, unspecified: Secondary | ICD-10-CM | POA: Diagnosis not present

## 2020-12-27 DIAGNOSIS — E559 Vitamin D deficiency, unspecified: Secondary | ICD-10-CM | POA: Diagnosis not present

## 2020-12-27 DIAGNOSIS — I10 Essential (primary) hypertension: Secondary | ICD-10-CM | POA: Diagnosis not present

## 2020-12-27 DIAGNOSIS — Z23 Encounter for immunization: Secondary | ICD-10-CM | POA: Diagnosis not present

## 2020-12-27 DIAGNOSIS — I700 Atherosclerosis of aorta: Secondary | ICD-10-CM | POA: Diagnosis not present

## 2020-12-27 DIAGNOSIS — Z0000 Encounter for general adult medical examination without abnormal findings: Secondary | ICD-10-CM | POA: Diagnosis not present

## 2020-12-27 DIAGNOSIS — G4700 Insomnia, unspecified: Secondary | ICD-10-CM | POA: Diagnosis not present

## 2020-12-29 ENCOUNTER — Other Ambulatory Visit: Payer: Self-pay | Admitting: Family Medicine

## 2020-12-29 DIAGNOSIS — Z1231 Encounter for screening mammogram for malignant neoplasm of breast: Secondary | ICD-10-CM

## 2020-12-29 DIAGNOSIS — E2839 Other primary ovarian failure: Secondary | ICD-10-CM

## 2021-01-06 ENCOUNTER — Other Ambulatory Visit: Payer: Self-pay | Admitting: Cardiovascular Disease

## 2021-01-06 ENCOUNTER — Other Ambulatory Visit: Payer: Self-pay | Admitting: General Practice

## 2021-01-16 MED ORDER — METOPROLOL SUCCINATE ER 25 MG PO TB24: 12.5000 mg | ORAL_TABLET | Freq: Every day | ORAL | 2 refills | Status: DC

## 2021-01-19 ENCOUNTER — Ambulatory Visit (INDEPENDENT_AMBULATORY_CARE_PROVIDER_SITE_OTHER): Payer: Medicare Other

## 2021-01-19 ENCOUNTER — Other Ambulatory Visit: Payer: Self-pay

## 2021-01-19 DIAGNOSIS — Z5181 Encounter for therapeutic drug level monitoring: Secondary | ICD-10-CM

## 2021-01-19 DIAGNOSIS — M25551 Pain in right hip: Secondary | ICD-10-CM | POA: Diagnosis not present

## 2021-01-19 DIAGNOSIS — I480 Paroxysmal atrial fibrillation: Secondary | ICD-10-CM | POA: Diagnosis not present

## 2021-01-19 DIAGNOSIS — I4819 Other persistent atrial fibrillation: Secondary | ICD-10-CM

## 2021-01-19 LAB — POCT INR: INR: 2.3 (ref 2.0–3.0)

## 2021-01-19 NOTE — Patient Instructions (Signed)
continue taking 1 tablet daily except 2 tablets on Monday, Wednesday, and Friday.* Repeat INR in 6 weeks. [facility] [phone]

## 2021-01-25 DIAGNOSIS — M25551 Pain in right hip: Secondary | ICD-10-CM | POA: Diagnosis not present

## 2021-01-25 DIAGNOSIS — R262 Difficulty in walking, not elsewhere classified: Secondary | ICD-10-CM | POA: Diagnosis not present

## 2021-02-02 ENCOUNTER — Ambulatory Visit
Admission: RE | Admit: 2021-02-02 | Discharge: 2021-02-02 | Disposition: A | Discharge status: Home / Self Care | Payer: Medicare Other | Source: Ambulatory Visit | Attending: Family Medicine | Admitting: Family Medicine

## 2021-02-02 ENCOUNTER — Other Ambulatory Visit: Payer: Self-pay

## 2021-02-02 DIAGNOSIS — Z1231 Encounter for screening mammogram for malignant neoplasm of breast: Secondary | ICD-10-CM | POA: Diagnosis not present

## 2021-02-02 DIAGNOSIS — Z85828 Personal history of other malignant neoplasm of skin: Secondary | ICD-10-CM | POA: Diagnosis not present

## 2021-02-02 DIAGNOSIS — L245 Irritant contact dermatitis due to other chemical products: Secondary | ICD-10-CM | POA: Diagnosis not present

## 2021-02-02 DIAGNOSIS — L821 Other seborrheic keratosis: Secondary | ICD-10-CM | POA: Diagnosis not present

## 2021-02-02 DIAGNOSIS — D235 Other benign neoplasm of skin of trunk: Secondary | ICD-10-CM | POA: Diagnosis not present

## 2021-02-02 DIAGNOSIS — L570 Actinic keratosis: Secondary | ICD-10-CM | POA: Diagnosis not present

## 2021-02-02 DIAGNOSIS — D1721 Benign lipomatous neoplasm of skin and subcutaneous tissue of right arm: Secondary | ICD-10-CM | POA: Diagnosis not present

## 2021-02-02 DIAGNOSIS — D1801 Hemangioma of skin and subcutaneous tissue: Secondary | ICD-10-CM | POA: Diagnosis not present

## 2021-02-07 ENCOUNTER — Other Ambulatory Visit: Payer: Self-pay

## 2021-02-07 ENCOUNTER — Other Ambulatory Visit: Payer: Self-pay | Admitting: Cardiovascular Disease

## 2021-02-07 ENCOUNTER — Ambulatory Visit (HOSPITAL_COMMUNITY): Payer: Medicare Other | Attending: Cardiology

## 2021-02-07 DIAGNOSIS — I340 Nonrheumatic mitral (valve) insufficiency: Secondary | ICD-10-CM | POA: Diagnosis not present

## 2021-02-07 LAB — ECHOCARDIOGRAM COMPLETE
AR max vel: 1.12 cm2
AV Area VTI: 1.11 cm2
AV Area mean vel: 1.09 cm2
AV Mean grad: 10 mmHg
AV Peak grad: 18.5 mmHg
Ao pk vel: 2.15 m/s
Area-P 1/2: 4.31 cm2
MV M vel: 5.6 m/s
MV Peak grad: 125.4 mmHg
P 1/2 time: 504 msec
Radius: 0.6 cm
S' Lateral: 3.8 cm

## 2021-02-10 NOTE — Telephone Encounter (Signed)
Please advise 

## 2021-02-28 ENCOUNTER — Other Ambulatory Visit: Payer: Self-pay

## 2021-02-28 ENCOUNTER — Encounter: Payer: Self-pay | Admitting: Cardiovascular Disease

## 2021-02-28 ENCOUNTER — Ambulatory Visit (INDEPENDENT_AMBULATORY_CARE_PROVIDER_SITE_OTHER): Payer: Medicare Other | Admitting: *Deleted

## 2021-02-28 ENCOUNTER — Ambulatory Visit (INDEPENDENT_AMBULATORY_CARE_PROVIDER_SITE_OTHER): Payer: Medicare Other | Admitting: Cardiovascular Disease

## 2021-02-28 VITALS — BP 126/66 | HR 72 | Ht 67.0 in | Wt 221.6 lb

## 2021-02-28 DIAGNOSIS — I5032 Chronic diastolic (congestive) heart failure: Secondary | ICD-10-CM | POA: Diagnosis not present

## 2021-02-28 DIAGNOSIS — I700 Atherosclerosis of aorta: Secondary | ICD-10-CM | POA: Diagnosis not present

## 2021-02-28 DIAGNOSIS — I340 Nonrheumatic mitral (valve) insufficiency: Secondary | ICD-10-CM | POA: Diagnosis not present

## 2021-02-28 DIAGNOSIS — E785 Hyperlipidemia, unspecified: Secondary | ICD-10-CM

## 2021-02-28 DIAGNOSIS — J984 Other disorders of lung: Secondary | ICD-10-CM

## 2021-02-28 DIAGNOSIS — I7121 Aneurysm of the ascending aorta, without rupture: Secondary | ICD-10-CM | POA: Diagnosis not present

## 2021-02-28 DIAGNOSIS — Z5181 Encounter for therapeutic drug level monitoring: Secondary | ICD-10-CM

## 2021-02-28 DIAGNOSIS — Z7901 Long term (current) use of anticoagulants: Secondary | ICD-10-CM | POA: Diagnosis not present

## 2021-02-28 DIAGNOSIS — I7123 Aneurysm of the descending thoracic aorta, without rupture: Secondary | ICD-10-CM

## 2021-02-28 DIAGNOSIS — I4819 Other persistent atrial fibrillation: Secondary | ICD-10-CM

## 2021-02-28 DIAGNOSIS — Z952 Presence of prosthetic heart valve: Secondary | ICD-10-CM

## 2021-02-28 DIAGNOSIS — G4733 Obstructive sleep apnea (adult) (pediatric): Secondary | ICD-10-CM

## 2021-02-28 LAB — POCT INR: INR: 1.8 — AB (ref 2.0–3.0)

## 2021-02-28 MED ORDER — ROSUVASTATIN CALCIUM 20 MG PO TABS: 20.0000 mg | ORAL_TABLET | Freq: Every day | ORAL | 3 refills | Status: DC

## 2021-02-28 NOTE — Progress Notes (Signed)
Cardiology Office Note:    Date:  03/05/2021   ID:  JNAI SNELLGROVE, DOB 03/18/1946, MRN [id number]  PCP:  Glenis Smoker, MD  [facility] Cardiologist:  Sanda Klein, MD  [place] Electrophysiologist:  None   Referring MD: Maurice Small, MD   No chief complaint on file.   History of Present Illness:    LASHARN BUFKIN is a 75 y.o. female with a hx of numerous cardiac problems including chronic combined systolic and diastolic heart failure, mitral insufficiency, history of mechanical aortic valve prosthesis (2006, St Jude), permanent atrial fibrillation with history of ablation x2 (most recent 2017) and failure to maintain sinus rhythm on dofetilide and amiodarone, OSA on CPAP, history of hemidiaphragm paralysis following heart surgery, mild-moderate aneurysm of the ascending aorta), normal coronary arteries by remote catheterization 2005, a couple of serious bleeding complications including subdural hematoma and 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  The cause of LVEF decline was uncertain, but she responded well to discontinuation of diltiazem and treatment with diuretics, Entresto, carvedilol (the doses have been limited by low blood pressure).  In the past she reportedly did not tolerate beta-blockers due to depression, but the current low-dose of carvedilol seems to be working well for her. Follow-up echocardiogram performed in October 2021 showed only partial improvement in LVEF now up to 40-45% in the left ventricle remain dilated with an end-systolic diameter of 49 mm.  Mitral regurgitation remains severe with a central jet.  She  underwent a TEE on 07/12/2020 that shows further improvement in LVEF which is now almost normal at 50-55%.  It also showed that the mitral regurgitation is central and squarely in the moderate to severe range (there was no reversal of flow in the pulmonary veins, all calculated parameters suggested higher and of moderate MR).  Anatomically, the valve appears appropriate for MitraClip, without major leaflet retraction and with a mean gradient of 3 mmHg at a heart rate around 100 bpm.  She underwent right and left heart catheterization on 10/14/2020.  This showed excellent hemodynamic values and normal pulmonary artery pressure.  The PAWP was only 10 mmHg and the V wave was not prominent.  Follow-up transthoracic echocardiogram performed 02/07/2021 shows complete normalization of LVEF to 55%, moderate to severe mitral regurgitation, normal parameters of the mechanical aortic valve prosthesis with a mean gradient of 10 mmHg.  She is doing well, walking 5 days a week, three quarters of a mile each time.  She feels a little more tired over the last 2 weeks, with fatigue dominating over mild dyspnea.  She does not have orthopnea, PND or lower extremity edema.  She denies dizziness, syncope or palpitations.  She denies falls, serious bleeding and is compliant with warfarin and INR monitoring.  She has not required any adjustment in her diuretic dose.  She takes furosemide 80 mg daily every day and has not seen big shifts in her weight.  Her Fitbit appears to show appropriate increase in heart rate with physical activity, without any convincing evidence for chronotropic incompetence.  She stopped taking metoprolol 12.5 mg daily due to fatigue and depression.  Her Fitbit, has also not shown any elevation in her heart rates in the last  6 weeks to suggest atrial fibrillation.  Pulmonary function test performed in May 2021 were abnormal with restrictive findings: FEV1 of 39% of predicted, commensurate FVC 40% of  predicted.  Her pulmonary specialist is Dr. Ander Slade. She reports compliance with CPAP.  Her primary care provider is trying to discontinue her alprazolam and has placed her on trazodone.  She is not sure if this is working for her.   Past Medical History:  Diagnosis Date   Anxiety    Aortic stenosis    status post aortic valve replacement with St. Jude mechanical prosthesis   Ascending aorta dilatation ([place]) 06/25/2016   27mm by echo 06/2016 and 55mm by CT angin 2016   Chronic anticoagulation    Complication of anesthesia    pt states paralyzed diaphragm after AVR   Depression    DJD (degenerative joint disease) of knee    Dyslipidemia    GERD (gastroesophageal reflux disease)    "several years ago; none since" (11/12/2012)   Hyperlipidemia    Hyperlipidemia LDL goal <70 01/19/2017   Hypertension    Left atrial enlargement    Mitral regurgitation 06/25/2016   Mild moderate MR by echo 06/2016   Obesity    Persistent atrial fibrillation (HCC)    s/p afib ablation x 2 at [facility]   Sleep apnea    On CPAP at 12cm H2O   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 Jude AVR   CARDIOVERSION N/A 07/22/2012   Procedure: CARDIOVERSION;  Surgeon: [name], MD;  Location: [facility] ENDOSCOPY;  Service: Cardiovascular;  Laterality: N/A;   CARDIOVERSION N/A 11/25/2012   Procedure: CARDIOVERSION;  Surgeon: [name], MD;  Location: [place] ENDOSCOPY;  Service: Cardiovascular;  Laterality: N/A;   CARDIOVERSION N/A 12/30/2012   Procedure: CARDIOVERSION;  Surgeon: [name], MD;  Location: [place] ENDOSCOPY;  Service: Cardiovascular;  Laterality: N/A;  h&p in file-HW    CARDIOVERSION N/A 03/30/2013   Procedure: CARDIOVERSION;  Surgeon: [name], MD;  Location: [place] ENDOSCOPY;  Service:  Cardiovascular;  Laterality: N/A;   ELECTROPHYSIOLOGIC STUDY  11/2013   Afib ablation x 2 (11/2013 and 10/2015) at [facility] by Dr Clyda Hurdle with recurrence post ablation   KNEE ARTHROSCOPY Left 1980's   "2" (11/12/2012)   KNEE SURGERY Left 1980's   "after 2 scopes they went in and did some kind of OR" (11/12/2012)   RIGHT/LEFT HEART CATH AND CORONARY ANGIOGRAPHY N/A 10/14/2020   Procedure: RIGHT/LEFT HEART CATH AND CORONARY ANGIOGRAPHY;  Surgeon: Martinique, Peter M, MD;  Location: [place] CV LAB;  Service: Cardiovascular;  Laterality: N/A;   TEE WITHOUT CARDIOVERSION N/A 07/22/2012   Procedure: TRANSESOPHAGEAL ECHOCARDIOGRAM (TEE);  Surgeon: [name], MD;  Location: [facility] ENDOSCOPY;  Service: Cardiovascular;  Laterality: N/A;  Rm 2034   TEE WITHOUT CARDIOVERSION N/A 10/07/2013   Procedure: TRANSESOPHAGEAL ECHOCARDIOGRAM (TEE);  Surgeon: [name], MD;  Location: [facility] ENDOSCOPY;  Service: Cardiovascular;  Laterality: N/A;   TEE WITHOUT CARDIOVERSION N/A 07/12/2020   Procedure: TRANSESOPHAGEAL ECHOCARDIOGRAM (TEE);  Surgeon: Sanda Klein, MD;  Location: [place];  Service: Cardiovascular;  Laterality: N/A;   TOTAL KNEE ARTHROPLASTY Left 11/05/2014   Procedure: TOTAL KNEE ARTHROPLASTY;  Surgeon: Dorna Leitz, MD;  Location: [place];  Service:  Orthopedics;  Laterality: Left;   TUBAL LIGATION  1980's    Current Medications: Current Meds  Medication Sig   acetaminophen (TYLENOL) 500 MG tablet Take 1,000 mg by mouth every 6 (six) hours as needed (pain).   calcium-vitamin D (OSCAL WITH D) 500-200 MG-UNIT per tablet Take 2 tablets by mouth in the morning.   Cholecalciferol (VITAMIN D) 2000 UNITS tablet Take 4,000 Units by mouth in the morning.   ferrous sulfate 325 (65 FE) MG tablet Take 325 mg by mouth in the morning.   furosemide (LASIX) 40 MG tablet TAKE 1 TABLET BY MOUTH DAILY. OKAY TO TAKE EXTRA FOR WEIGHT GAIN> 3LB/ DAILY OR 5LB/WEEKLY   levothyroxine (SYNTHROID, LEVOTHROID) 100 MCG tablet Take 100 mcg  daily before breakfast by mouth.   Multiple Vitamin (MULTIVITAMIN WITH MINERALS) TABS Take 1 tablet by mouth in the morning.   oxymetazoline (AFRIN) 0.05 % nasal spray Place 1 spray into both nostrils daily as needed for congestion.   potassium chloride SA (KLOR-CON) 20 MEQ tablet TAKE 2 TABLETS(40 MEQ) BY MOUTH DAILY   rosuvastatin (CRESTOR) 20 MG tablet Take 1 tablet (20 mg total) by mouth daily.   sacubitril-valsartan (ENTRESTO) 97-103 MG Take 1 tablet by mouth 2 (two) times daily.   sertraline (ZOLOFT) 100 MG tablet Take 100 mg by mouth in the morning.   traZODone (DESYREL) 50 MG tablet Take 50-100 mg by mouth at bedtime as needed.   tretinoin (RETIN-A) 0.05 % cream Apply 1 application topically at bedtime. Applied to face   warfarin (COUMADIN) 4 MG tablet TAKE 1 TO 2 TABLETS BY MOUTH DAILY AS DIRECTED BY COUMADIN CLINIC   zolpidem (AMBIEN) 10 MG tablet Take 5 mg by mouth at bedtime as needed for sleep.   [DISCONTINUED] pravastatin (PRAVACHOL) 20 MG tablet Take 1 tablet (20 mg total) by mouth every evening.     Allergies:   Codeine, Metoprolol, Propoxyphene, and Dilantin [phenytoin sodium extended]   Social History   Socioeconomic History   Marital status: Married    Spouse name: Not on file   Number of children: Not on file   Years of education: Not on file   Highest education level: Not on file  Occupational History   Not on file  Tobacco Use   Smoking status: Never   Smokeless tobacco: Never  Substance and Sexual Activity   Alcohol use: No    Comment: 11/12/2012 "no alcohol in the last 9 years"   Drug use: No   Sexual activity: Not Currently  Other Topics Concern   Not on file  Social History Narrative   Pt lives in [place] with spouse.  Retired   Investment banker, operational of Radio broadcast assistant Strain: Not on [employer] Insecurity: Not on file  Transportation Needs: Not on file  Physical Activity: Not on file  Stress: Not on file  Social Connections: Not on  file     Family History: The patient's family history includes Heart disease in her brother; Hyperlipidemia in her father; Hypertension in her father and mother; Lung cancer in her mother.  ROS:   Please see the history of present illness.     All other systems reviewed and are negative.  EKGs/Labs/Other Studies Reviewed:    The following studies were reviewed today: TTE 2020/02/13 1. Systolic Index of contractility has improved from 669 to 865 mmHg/s.Marland Kitchen  Left ventricular ejection fraction, by estimation, is 35 to 40%. Left  ventricular ejection fraction by PLAX is 40 %. The  left ventricle has  moderately decreased function. The left  ventricle demonstrates global hypokinesis. The left ventricular internal  cavity size was mildly to moderately dilated. There is mild concentric  left ventricular hypertrophy. Left ventricular diastolic parameters are  indeterminate.   2. Right ventricular systolic function is moderately reduced. The right  ventricular size is moderately enlarged. There is moderately elevated  pulmonary artery systolic pressure. The estimated right ventricular  systolic pressure is 41.9 mmHg.   3. Left atrial size was severely dilated.   4. Right atrial size was severely dilated.   5. Central mitral regurgitation; suspect atrial dilation contributes to  mechanism . The mitral valve is grossly normal. Severe mitral valve  regurgitation.   6. Prosthetic DVI 0.33, AT < 100 ms. The aortic valve was not well  visualized. Aortic valve regurgitation is not visualized. There is a 21 mm  St. Jude St. Jude Reagent mechanical valve present in the aortic position.  Procedure Date: 2006.   7. Aortic dilatation noted. There is mild dilatation of the aortic root,  measuring 40 mm. There is moderate dilatation of the ascending aorta,  measuring 47 mm.   8. The inferior vena cava is normal in size with <50% respiratory  variability, suggesting right atrial pressure of 8 mmHg.    TEE 07/12/2020  1. Left ventricular ejection fraction, by estimation, is 50 to 55%. The  left ventricle has low normal function. The left ventricle demonstrates  global hypokinesis. Left ventricular diastolic function could not be  evaluated.   2. Left atrial size was severely dilated. No left atrial/left atrial  appendage thrombus was detected. The LAA emptying velocity was 50 cm/s.  (the rhythm was atrial flutter).   3. Right atrial size was severely dilated.   4. The mitral valve is rheumatic. Moderate to severe mitral valve  regurgitation. No evidence of mitral stenosis. The mean mitral valve  gradient is 3.0 mmHg.   5. Tricuspid valve regurgitation is moderate.   6. The aortic valve has been repaired/replaced. Aortic valve  regurgitation is trivial. No aortic stenosis is present. There is a 19 mm  mechanical valve present in the aortic position. Echo findings are  consistent with normal structure and function of  the aortic valve prosthesis. Aortic valve mean gradient measures 3.0 mmHg.  Aortic valve Vmax measures 1.09 m/s.   7. Aortic dilatation noted. There is mild dilatation of the ascending  aorta, measuring 40 mm. There is Moderate (Grade III) atheroma plaque  involving the descending aorta.   8. Evidence of atrial level shunting detected by color flow Doppler.  There is a small patent foramen ovale with predominantly left to right  shunting across the atrial septum.   9. Right ventricular systolic function is normal. The right ventricular  size is normal. There is mildly elevated pulmonary artery systolic  pressure.   Echo 02/07/2021 1. Left ventricular ejection fraction, by estimation, is 55%. The left  ventricle has normal function. The left ventricle has no regional wall  motion abnormalities. Left ventricular diastolic parameters are consistent  with Grade II diastolic dysfunction  (pseudonormalization).   2. Mildly D-shaped septum consistent with RV  pressure/volume overload.  Right ventricular systolic function is moderately reduced. The right  ventricular size is mildly enlarged. There is normal pulmonary artery  systolic pressure. The estimated right  ventricular systolic pressure is 62.2 mmHg.   3. Left atrial size was moderately dilated.   4. Right atrial size was severely dilated.   5. The  mitral valve is abnormal with thickening and calcification, the  posterior leaflet is restricted. Possible rheumatic valve disease. There  is moderate to severe mitral valve regurgitation, PISA does not appear  accurate. No evidence of mitral  stenosis. Moderate mitral annular calcification.   6. St Jude mechanical aortic valve. Trivial regurgitation. Mean gradient  10 mmHg. Normal function.   7. Aortic dilatation noted. There is moderate dilatation of the ascending  aorta, measuring 45 mm.   8. The inferior vena cava is normal in size with greater than 50%  respiratory variability, suggesting right atrial pressure of 3 mmHg.   Comparison(s): 08/15/20 EF 35-40%. PA pressure 61mmHg. AV 76mmHg mean PG,  24mmHg peak PG. Moderate-severe MR.  Cardiac cath 10/12/2020 1. Normal coronary anatomy 2. Borderline pulmonary HTN. Mean PAP 22 mmHg 3. Normal PCWP mean 10 mm Hg. V wave does not appear prominent 4. Normal cardiac output.   Plan: resume anticoagulation. Continue medical therapy.  Fick Cardiac Output 8.73 L/min  Fick Cardiac Output Index 4.18 (L/min)/BSA  RA A Wave 5 mmHg  RA V Wave 6 mmHg  RA Mean 3 mmHg  RV Systolic Pressure 42 mmHg  RV Diastolic Pressure 0 mmHg  RV EDP 5 mmHg  PA Systolic Pressure 41 mmHg  PA Diastolic Pressure 11 mmHg  PA Mean 22 mmHg  PW A Wave 12 mmHg  PW V Wave 17 mmHg  PW Mean 10 mmHg  AO Systolic Pressure 408 mmHg  AO Diastolic Pressure 49 mmHg  AO Mean 76 mmHg  QP/QS 1  TPVR Index 5.26 HRUI  TSVR Index 18.19 HRUI  PVR SVR Ratio 0.16  TPVR/TSVR Ratio 0.29    EKG:  EKG is not ordered today.  Last  ECG shows atrial fibrillation with right bundle branch block and T wave inversion V4-V6  Recent Labs: 08/14/2020: ALT 19 08/15/2020: B Natriuretic Peptide 598.4; Magnesium 2.1; TSH 4.836 09/28/2020: BUN 30; Creatinine, Ser 0.88; Platelets 207 10/14/2020: Hemoglobin 10.5; Potassium 3.8; Sodium 143  12/27/2020 Hemoglobin 12.0, creatinine 0.71, potassium 4.1, ALT 16, TSH 1.54 Recent Lipid Panel    Component Value Date/Time   CHOL 175 05/27/2018 1125   TRIG 100 05/27/2018 1125   HDL 71 05/27/2018 1125   CHOLHDL 2.5 05/27/2018 1125   LDLCALC 84 05/27/2018 1125   01/05/2021 cholesterol 205, HDL 54, LDL 126, triglycerides 136.  Physical Exam:    VS:  BP 126/66 (BP Location: Left Arm, Patient Position: Sitting, Cuff Size: Large)   Pulse 72   Ht 5\' 7"  (1.702 m)   Wt 221 lb 9.6 oz (100.5 kg)   SpO2 96%   BMI 34.71 kg/m     Wt Readings from Last 3 Encounters:  02/28/21 221 lb 9.6 oz (100.5 kg)  10/26/20 219 lb (99.3 kg)  10/14/20 215 lb (97.5 kg)      General: Alert, oriented x3, no distress, appears well Head: no evidence of trauma, PERRL, EOMI, no exophtalmos or lid lag, no myxedema, no xanthelasma; normal ears, nose and oropharynx Neck: normal jugular venous pulsations and no hepatojugular reflux; brisk carotid pulses without delay and no carotid bruits Chest: clear to auscultation, no signs of consolidation by percussion or palpation, normal fremitus, symmetrical and full respiratory excursions Cardiovascular: normal position and quality of the apical impulse, regular rhythm, crisp prosthetic valve clicks, 2/6 aortic ejection murmur is early peaking, 1-2/6 apical holosystolic murmur has very limited radiation, no diastolic murmurs, rubs or gallops Abdomen: no tenderness or distention, no masses by palpation, no abnormal pulsatility  or arterial bruits, normal bowel sounds, no hepatosplenomegaly Extremities: no clubbing, cyanosis or edema; 2+ radial, ulnar and brachial pulses bilaterally;  2+ right femoral, posterior tibial and dorsalis pedis pulses; 2+ left femoral, posterior tibial and dorsalis pedis pulses; no subclavian or femoral bruits Neurological: grossly nonfocal Psych: Normal mood and affect     ASSESSMENT:    1. Chronic diastolic CHF (congestive heart failure) ([place])   2. Nonrheumatic mitral (valve) insufficiency   3. Persistent atrial fibrillation ([place])   4. H/O mechanical aortic valve replacement   5. Long term current use of anticoagulant   6. OSA (obstructive sleep apnea)   7. Chronic restrictive lung disease   8. Aneurysm of descending thoracic aorta without rupture   9. Atherosclerosis of aorta ([place])   10. Hyperlipidemia LDL goal <70      PLAN:    In order of problems listed above:  CHF: Clinically euvolemic on the current dose of diuretic.  NYHA functional class I-2.  Excellent hemodynamic parameters at the time of the right heart catheterization 10/14/2020 when she weighed 215 pounds.  Today her weight is 6 pounds higher.   LVEF has returned to normal, suggesting that the mechanism may indeed have been tachycardia related, not related to mitral valve disease.  She does not have any signs or symptoms of coronary disease or any perfusion abnormalities on the nuclear stress test from May 2021.   MR: Continues to be moderate to severe even though LV systolic function has normalized.  The left ventricle is not dilated.  Improvement in LVEF suggest that MR was not the driving cause however.  At cardiac catheterization LV filling pressures were very low.  Anatomically, she does have appropriate findings for MitraClip should this become necessary in the future.  Followed with serial transthoracic echo AFib:  She did not tolerate even tiny doses of beta-blocker due to fatigue and depression. she may have some degree of chronotropic incompetence, but her Fitbit reported heart rate suggest that the pacemaker would not necessarily be beneficial at this point.   She  has had 2 previous ablation procedures and has failed both amiodarone and dofetilide.  Often has a relatively regular rhythm suggesting a competing junctional pacemaker.  She is on warfarin for her mechanical aortic valve anyway. Mech AVR: Normal function.  Normal gradients across the mechanical prosthesis (mean gradient 10 mmHg for size 19 St. Jude Regent dual leaflet). Anticoagulation: No recent falls or bleeding problems.  INR 2.3 at last check.  She does have a history of serious bleeding complications on 2 occasions (subdural hematoma 2006 and rectus sheath tumor 2019). OSA: She reports 100% compliance with CPAP.  She does not have daytime hypersomnolence. Restrictive lung disease: This may be a significant part of her chronic dyspnea.  Fairly significant reduction in FVC, at least in part attributable to obesity.  Follow-up with Dr. Ander Slade. Asc Ao dilation: CT scan in 2017 documented an ascending aorta diameter 4.3 cm, most recent transthoracic echo measured this at 4.7 cm.   The TEE did not confirm that, measuring a maximum diameter of only 4.0 cm.  We will clarify the diameter of her aorta with a follow up CT angiogram. Aortic atherosclerosis: No evidence of significant aortic atherosclerosis on CT of chest in 2017, but abdominal aortic atherosclerosis is described on CT from 2019.   HLP: Updating lipid profile today.  No evidence of CAD on previous angiography.  No angina pectoris.  Ideally would bring her LDL cholesterol down to less  than 70, but I believe LDL less than 100 is acceptable.  On statin.   Medication Adjustments/Labs and Tests Ordered: Current medicines are reviewed at length with the patient today.  Concerns regarding medicines are outlined above.  Orders Placed This Encounter  Procedures   CT ANGIO CHEST AORTA W/CM & OR WO/CM   Basic Metabolic Panel (BMET)   Lipid panel   ECHOCARDIOGRAM COMPLETE     Meds ordered this encounter  Medications   rosuvastatin (CRESTOR) 20  MG tablet    Sig: Take 1 tablet (20 mg total) by mouth daily.    Dispense:  90 tablet    Refill:  3     Patient Instructions  Medication Instructions:  STOP the Pravastatin  START Rosuvastatin 20 mg once daily  *If you need a refill on your cardiac medications before your next appointment, please call your pharmacy*   Lab Work: Your provider would like for you to return in 3 months to have the following labs drawn: fasting lipid. You do not need an appointment for the lab. Once in our office lobby there is a podium where you can sign in and ring the doorbell to alert [place] that you are here. The lab is open from 8:00 am to 4:30 pm; closed for lunch from 12:45pm-1:45pm.  If you have labs (blood work) drawn today and your tests are completely normal, you will receive your results only by: [place] (if you have MyChart) OR A paper copy in the mail If you have any lab test that is abnormal or we need to change your treatment, we will call you to review the results.   Testing/Procedures: Your physician has requested that you have an echocardiogram in November 2023. Echocardiography is a painless test that uses sound waves to create images of your heart. It provides your doctor with information about the size and shape of your heart and how well your heart's chambers and valves are working. You may receive an ultrasound enhancing agent through an IV if needed to better visualize your heart during the echo.This procedure takes approximately one hour. There are no restrictions for this procedure. This will take place at the [address], [address].   Dr. Sallyanne Kuster has ordered a CT Angio Chest to be completed in April 2023.   Follow-Up: At [facility], you and your health needs are our priority.  As part of our continuing mission to provide you with exceptional heart care, we have created designated Provider Care Teams.  These Care Teams include your primary Cardiologist (physician)  and Advanced Practice Providers (APPs -  Physician Assistants and Nurse Practitioners) who all work together to provide you with the care you need, when you need it.  We recommend signing up for the patient portal called "MyChart".  Sign up information is provided on this After Visit Summary.  MyChart is used to connect with patients for Virtual Visits (Telemedicine).  Patients are able to view lab/test results, encounter notes, upcoming appointments, etc.  Non-urgent messages can be sent to your provider as well.   To learn more about what you can do with MyChart, go to [email or web address].    Your next appointment:   12 month(s)  The format for your next appointment:   In Person  Provider:   Sanda Klein, MD      Signed, Sanda Klein, MD  03/05/2021 1:56 PM    [place]

## 2021-02-28 NOTE — Patient Instructions (Signed)
Medication Instructions:  STOP the Pravastatin  START Rosuvastatin 20 mg once daily  *If you need a refill on your cardiac medications before your next appointment, please call your pharmacy*   Lab Work: Your provider would like for you to return in 3 months to have the following labs drawn: fasting lipid. You do not need an appointment for the lab. Once in our office lobby there is a podium where you can sign in and ring the doorbell to alert [place] that you are here. The lab is open from 8:00 am to 4:30 pm; closed for lunch from 12:45pm-1:45pm.  If you have labs (blood work) drawn today and your tests are completely normal, you will receive your results only by: [place] (if you have MyChart) OR A paper copy in the mail If you have any lab test that is abnormal or we need to change your treatment, we will call you to review the results.   Testing/Procedures: Your physician has requested that you have an echocardiogram in November 2023. Echocardiography is a painless test that uses sound waves to create images of your heart. It provides your doctor with information about the size and shape of your heart and how well your heart's chambers and valves are working. You may receive an ultrasound enhancing agent through an IV if needed to better visualize your heart during the echo.This procedure takes approximately one hour. There are no restrictions for this procedure. This will take place at the [address].   Dr. Sallyanne Kuster has ordered a CT Angio Chest to be completed in April 2023.   Follow-Up: At [facility], you and your health needs are our priority.  As part of our continuing mission to provide you with exceptional heart care, we have created designated Provider Care Teams.  These Care Teams include your primary Cardiologist (physician) and Advanced Practice Providers (APPs -  Physician Assistants and Nurse Practitioners) who all work together to provide you with the  care you need, when you need it.  We recommend signing up for the patient portal called "MyChart".  Sign up information is provided on this After Visit Summary.  MyChart is used to connect with patients for Virtual Visits (Telemedicine).  Patients are able to view lab/test results, encounter notes, upcoming appointments, etc.  Non-urgent messages can be sent to your provider as well.   To learn more about what you can do with MyChart, go to [email or web address].    Your next appointment:   12 month(s)  The format for your next appointment:   In Person  Provider:   Sanda Klein, MD

## 2021-02-28 NOTE — Patient Instructions (Signed)
Description   Take 1.5 tablets of warfarin today, then continue taking 1 tablet daily except 2 tablets on Monday, Wednesday, and Friday. Repeat INR in 3 weeks. Coumadin Clinic [phone]

## 2021-03-05 DIAGNOSIS — I4819 Other persistent atrial fibrillation: Secondary | ICD-10-CM | POA: Insufficient documentation

## 2021-03-05 DIAGNOSIS — I700 Atherosclerosis of aorta: Secondary | ICD-10-CM | POA: Insufficient documentation

## 2021-03-05 DIAGNOSIS — I7123 Aneurysm of the descending thoracic aorta, without rupture: Secondary | ICD-10-CM | POA: Insufficient documentation

## 2021-03-05 DIAGNOSIS — J984 Other disorders of lung: Secondary | ICD-10-CM | POA: Insufficient documentation

## 2021-03-09 ENCOUNTER — Encounter: Payer: Self-pay | Admitting: *Deleted

## 2021-03-09 ENCOUNTER — Other Ambulatory Visit: Payer: Medicare Other

## 2021-03-14 ENCOUNTER — Ambulatory Visit
Admission: RE | Admit: 2021-03-14 | Discharge: 2021-03-14 | Disposition: A | Discharge status: Home / Self Care | Payer: Medicare Other | Source: Ambulatory Visit | Attending: Family Medicine | Admitting: Family Medicine

## 2021-03-14 ENCOUNTER — Other Ambulatory Visit: Payer: Self-pay

## 2021-03-14 DIAGNOSIS — M8589 Other specified disorders of bone density and structure, multiple sites: Secondary | ICD-10-CM | POA: Diagnosis not present

## 2021-03-14 DIAGNOSIS — E2839 Other primary ovarian failure: Secondary | ICD-10-CM

## 2021-03-21 ENCOUNTER — Ambulatory Visit (INDEPENDENT_AMBULATORY_CARE_PROVIDER_SITE_OTHER): Payer: Medicare Other | Admitting: *Deleted

## 2021-03-21 ENCOUNTER — Other Ambulatory Visit: Payer: Self-pay

## 2021-03-21 DIAGNOSIS — Z5181 Encounter for therapeutic drug level monitoring: Secondary | ICD-10-CM | POA: Diagnosis not present

## 2021-03-21 DIAGNOSIS — I4819 Other persistent atrial fibrillation: Secondary | ICD-10-CM

## 2021-03-21 LAB — POCT INR: INR: 2.4 (ref 2.0–3.0)

## 2021-03-21 NOTE — Patient Instructions (Signed)
Description   Continue taking 1 tablet daily except 2 tablets on Monday, Wednesday, and Friday. Repeat INR in 4 weeks. Coumadin Clinic [phone]

## 2021-03-23 ENCOUNTER — Other Ambulatory Visit: Payer: Medicare Other

## 2021-04-05 DIAGNOSIS — J209 Acute bronchitis, unspecified: Secondary | ICD-10-CM | POA: Diagnosis not present

## 2021-04-12 ENCOUNTER — Other Ambulatory Visit: Payer: Self-pay

## 2021-04-12 ENCOUNTER — Emergency Department: Payer: Medicare Other

## 2021-04-12 DIAGNOSIS — Z20822 Contact with and (suspected) exposure to covid-19: Secondary | ICD-10-CM | POA: Insufficient documentation

## 2021-04-12 DIAGNOSIS — R059 Cough, unspecified: Secondary | ICD-10-CM | POA: Diagnosis not present

## 2021-04-12 DIAGNOSIS — Z5321 Procedure and treatment not carried out due to patient leaving prior to being seen by health care provider: Secondary | ICD-10-CM | POA: Diagnosis not present

## 2021-04-12 DIAGNOSIS — R5383 Other fatigue: Secondary | ICD-10-CM | POA: Diagnosis not present

## 2021-04-12 DIAGNOSIS — I4891 Unspecified atrial fibrillation: Secondary | ICD-10-CM | POA: Diagnosis not present

## 2021-04-12 DIAGNOSIS — S199XXA Unspecified injury of neck, initial encounter: Secondary | ICD-10-CM | POA: Diagnosis not present

## 2021-04-12 DIAGNOSIS — H532 Diplopia: Secondary | ICD-10-CM | POA: Diagnosis not present

## 2021-04-12 DIAGNOSIS — R55 Syncope and collapse: Secondary | ICD-10-CM | POA: Diagnosis not present

## 2021-04-12 LAB — BASIC METABOLIC PANEL
Anion gap: 9 (ref 5–15)
BUN: 22 mg/dL (ref 8–23)
CO2: 30 mmol/L (ref 22–32)
Calcium: 9.6 mg/dL (ref 8.9–10.3)
Chloride: 100 mmol/L (ref 98–111)
Creatinine, Ser: 0.74 mg/dL (ref 0.44–1.00)
GFR, Estimated: >60 mL/min (ref >60)
Glucose, Bld: 91 mg/dL (ref 70–99)
Potassium: 3.7 mmol/L (ref 3.5–5.1)
Sodium: 139 mmol/L (ref 135–145)

## 2021-04-12 LAB — RESP PANEL BY RT-PCR (FLU A&B, COVID) ARPGX2
Influenza A by PCR: NEGATIVE
Influenza B by PCR: NEGATIVE
SARS Coronavirus 2 by RT PCR: NEGATIVE

## 2021-04-12 LAB — CBC
HCT: 37.3 % (ref 36.0–46.0)
Hemoglobin: 12.6 g/dL (ref 12.0–15.0)
MCH: 31.5 pg (ref 26.0–34.0)
MCHC: 33.8 g/dL (ref 30.0–36.0)
MCV: 93.3 fL (ref 80.0–100.0)
Platelets: 192 10*3/uL (ref 150–400)
RBC: 4 MIL/uL (ref 3.87–5.11)
RDW: 12.8 % (ref 11.5–15.5)
WBC: 4.9 10*3/uL (ref 4.0–10.5)
nRBC: 0 % (ref 0.0–0.2)

## 2021-04-12 NOTE — ED Triage Notes (Signed)
Pt to ED for syncope episode today, reports getting over bronchitis, generalized fatigue.  Denies pain.  Ambulatory.

## 2021-04-12 NOTE — ED Provider Triage Note (Signed)
Emergency Medicine Provider Triage Evaluation Note  [name], a 76 y.o. female  was evaluated in triage.  Pt complains of syncope and collapse.  Patient reports she was in her craft room closet, when she apparently passed out but she is unclear of the duration of her syncopal episode.  It was not until several hours later that her husband came to check on her, because typically she spent several hours in the craft room.  She denies any frank injury or pain related to the fall.  She does endorse being currently treated for bronchitis with Tessalon Perles and.  Review of Systems  Positive: syncope Negative: FCS  Physical Exam  BP 123/80    Pulse 74    Temp 97.7 F (36.5 C) (Oral)    Resp 20    Ht 5\' 4"  (1.626 m)    Wt 115 kg    SpO2 95%    BMI 43.52 kg/m  Gen:   Awake, no distress   Resp:  Normal effort  MSK:   Moves extremities without difficulty    Medical Decision Making  Medically screening exam initiated at 6:34 PM.  Appropriate orders placed.  [name] was informed that the remainder of the evaluation will be completed by another provider, this initial triage assessment does not replace that evaluation, and the importance of remaining in the ED until their evaluation is complete.  Patient ED evaluation of a syncopal episode of unknown duration, prior to arrival.   Melvenia Needles, PA-C 04/12/21 1842

## 2021-04-13 ENCOUNTER — Emergency Department
Admission: EM | Admit: 2021-04-13 | Discharge: 2021-04-13 | Disposition: A | Discharge status: Home / Self Care | Payer: Medicare Other | Attending: Emergency Medicine | Admitting: Emergency Medicine

## 2021-04-13 ENCOUNTER — Encounter: Payer: Self-pay | Admitting: Cardiovascular Disease

## 2021-04-14 NOTE — Telephone Encounter (Signed)
Spoke with patient who reports falling at home and not remembering what happened. He husband found her lying on the floor.  She went to the ED and "all test results were negative" and is "feeling fine at this time". She asked to see Dr. Sallyanne Kuster to see if her episode was heart-related. Appointment made for 04/26/21.

## 2021-04-17 DIAGNOSIS — F330 Major depressive disorder, recurrent, mild: Secondary | ICD-10-CM | POA: Diagnosis not present

## 2021-04-17 DIAGNOSIS — I10 Essential (primary) hypertension: Secondary | ICD-10-CM | POA: Diagnosis not present

## 2021-04-17 DIAGNOSIS — E039 Hypothyroidism, unspecified: Secondary | ICD-10-CM | POA: Diagnosis not present

## 2021-04-17 DIAGNOSIS — D72819 Decreased white blood cell count, unspecified: Secondary | ICD-10-CM | POA: Diagnosis not present

## 2021-04-18 ENCOUNTER — Ambulatory Visit (INDEPENDENT_AMBULATORY_CARE_PROVIDER_SITE_OTHER): Payer: Medicare Other | Admitting: *Deleted

## 2021-04-18 ENCOUNTER — Other Ambulatory Visit: Payer: Self-pay

## 2021-04-18 DIAGNOSIS — I4819 Other persistent atrial fibrillation: Secondary | ICD-10-CM

## 2021-04-18 DIAGNOSIS — Z5181 Encounter for therapeutic drug level monitoring: Secondary | ICD-10-CM

## 2021-04-18 LAB — POCT INR: INR: 1.9 — AB (ref 2.0–3.0)

## 2021-04-18 NOTE — Patient Instructions (Signed)
Description    -Take 1.5 tablets of warfarin today - Then continue taking 1 tablet daily except 2 tablets on Monday, Wednesday, and Friday. Repeat INR in 3weeks. Coumadin Clinic [phone]

## 2021-04-21 ENCOUNTER — Ambulatory Visit: Payer: Medicare Other | Admitting: Pulmonary Disease

## 2021-04-24 ENCOUNTER — Encounter: Payer: Self-pay | Admitting: Pulmonary Disease

## 2021-04-24 ENCOUNTER — Ambulatory Visit (INDEPENDENT_AMBULATORY_CARE_PROVIDER_SITE_OTHER): Payer: Medicare Other | Admitting: Pulmonary Disease

## 2021-04-24 ENCOUNTER — Other Ambulatory Visit: Payer: Self-pay

## 2021-04-24 VITALS — BP 120/78 | HR 67 | Temp 98.2°F | Ht 64.0 in | Wt 217.6 lb

## 2021-04-24 DIAGNOSIS — Z9989 Dependence on other enabling machines and devices: Secondary | ICD-10-CM | POA: Diagnosis not present

## 2021-04-24 DIAGNOSIS — G4733 Obstructive sleep apnea (adult) (pediatric): Secondary | ICD-10-CM | POA: Diagnosis not present

## 2021-04-24 MED ORDER — FLUTICASONE FUROATE-VILANTEROL 100-25 MCG/ACT IN AEPB: 1.0000 | INHALATION_SPRAY | Freq: Every day | RESPIRATORY_TRACT | 3 refills | Status: DC

## 2021-04-24 MED ORDER — FLUTICASONE FUROATE-VILANTEROL 100-25 MCG/ACT IN AEPB: 1.0000 | INHALATION_SPRAY | Freq: Every day | RESPIRATORY_TRACT | 0 refills | Status: DC

## 2021-04-24 NOTE — Patient Instructions (Addendum)
We will decrease your CPAP pressure from 17 down to 15 -We will keep an eye on this with the downloads we get from the machine to make sure it is working well -If the pressure change is not well-tolerated, let us know as soon as possible and we can call the medical supply company to change it back  For the cough that is persistent -Your breathing study did reveal hyperactivity previously -Trial with inhalers -Prescription for Breo -Make sure you rinse your mouth following use of inhalers  I will see you in about 8 weeks  Call with significant concerns

## 2021-04-24 NOTE — Progress Notes (Signed)
[name]    [id number]    11/01/1945  Primary Care Physician:Timberlake, Anastasia Pall, MD  Referring Physician: Glenis Smoker, MD [address] [place],  [place] [address]  Chief complaint:   Patient being seen for daytime fatigue, shortness of breath Currently being treated for sleep apnea Has had a persistent cough, recent treatment for bronchitis  HPI:  History of obstructive sleep apnea diagnosed in 2015 Repeat study with severe obstructive sleep apnea with CPAP of 17 recommended Continues to use CPAP on a nightly basis -Occasionally feels the pressure may be high -Continues to do well with CPAP   Has had a chronic cough-about 5 months now The cough became deep and productive about Christmas time and was treated with a course of antibiotics at the time She is now left with a cough with some yellow phlegm, rare streaks of blood Recent chest x-ray was clear  She has been feeling well with using CPAP, remains compliant with use  Recently hospitalized for respiratory failure and decompensated heart failure Feeling better  She is exercising regularly and has managed to lose about 30 pounds since her last visit  history of atrial fibrillation History of pulmonary hypertension History of asthma  History of aortic valve disease for which he had repair in the past-has a mechanical aortic valve  She does try to exercise with an elliptical device for about 30 minutes on a regular basis, this is done in a seated position  Outpatient Encounter Medications as of 04/24/2021  Medication Sig   acetaminophen (TYLENOL) 500 MG tablet Take 1,000 mg by mouth every 6 (six) hours as needed (pain).   calcium-vitamin D (OSCAL WITH D) 500-200 MG-UNIT per tablet Take 2 tablets by mouth in the morning.   Cholecalciferol (VITAMIN D) 2000 UNITS tablet Take 4,000 Units by mouth in the morning.   ferrous sulfate 325 (65 FE) MG tablet Take 325 mg by mouth in the  morning.   furosemide (LASIX) 40 MG tablet TAKE 1 TABLET BY MOUTH DAILY. OKAY TO TAKE EXTRA FOR WEIGHT GAIN> 3LB/ DAILY OR 5LB/WEEKLY   levothyroxine (SYNTHROID, LEVOTHROID) 100 MCG tablet Take 100 mcg daily before breakfast by mouth.   Multiple Vitamin (MULTIVITAMIN WITH MINERALS) TABS Take 1 tablet by mouth in the morning.   oxymetazoline (AFRIN) 0.05 % nasal spray Place 1 spray into both nostrils daily as needed for congestion.   potassium chloride SA (KLOR-CON) 20 MEQ tablet TAKE 2 TABLETS(40 MEQ) BY MOUTH DAILY   rosuvastatin (CRESTOR) 20 MG tablet Take 1 tablet (20 mg total) by mouth daily.   sacubitril-valsartan (ENTRESTO) 97-103 MG Take 1 tablet by mouth 2 (two) times daily.   sertraline (ZOLOFT) 100 MG tablet Take 100 mg by mouth in the morning.   traZODone (DESYREL) 50 MG tablet Take 50-100 mg by mouth at bedtime as needed.   tretinoin (RETIN-A) 0.05 % cream Apply 1 application topically at bedtime. Applied to face   warfarin (COUMADIN) 4 MG tablet TAKE 1 TO 2 TABLETS BY MOUTH DAILY AS DIRECTED BY COUMADIN CLINIC   zolpidem (AMBIEN) 10 MG tablet Take 5 mg by mouth at bedtime as needed for sleep.   No facility-administered encounter medications on file as of 04/24/2021.    Allergies as of 04/24/2021 - Review Complete 04/24/2021  Allergen Reaction Noted   Codeine Anaphylaxis 07/17/2012   Dilantin [phenytoin sodium extended] Rash and Itching 07/17/2012   Metoprolol Other (See Comments) 10/23/2013   Propoxyphene  Other (See Comments) 10/23/2013    Past Medical History:  Diagnosis Date   Anxiety    Aortic stenosis    status post aortic valve replacement with St. 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   Depression    DJD (degenerative joint disease) of knee    Dyslipidemia     GERD (gastroesophageal reflux disease)    "several years ago; none since" (11/12/2012)   Hyperlipidemia    Hyperlipidemia LDL goal <70 01/19/2017   Hypertension    Left atrial enlargement    Mitral regurgitation 06/25/2016   Mild moderate MR by echo 06/2016   Obesity    Persistent atrial fibrillation (HCC)    s/p afib ablation x 2 [facility]   Sleep apnea    On CPAP at 12cm H2O   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 Jude AVR   CARDIOVERSION N/A 07/22/2012   Procedure: CARDIOVERSION;  Surgeon: [name], MD;  Location: [facility] ENDOSCOPY;  Service: Cardiovascular;  Laterality: N/A;   CARDIOVERSION N/A 11/25/2012   Procedure: CARDIOVERSION;  Surgeon: [name], MD;  Location: [place] ENDOSCOPY;  Service: Cardiovascular;  Laterality: N/A;   CARDIOVERSION N/A 12/30/2012   Procedure: CARDIOVERSION;  Surgeon: [name], MD;  Location: [place] ENDOSCOPY;  Service: Cardiovascular;  Laterality: N/A;  h&p in file-HW    CARDIOVERSION N/A 03/30/2013   Procedure: CARDIOVERSION;  Surgeon: [name], MD;  Location: [place] ENDOSCOPY;  Service: Cardiovascular;  Laterality: N/A;   ELECTROPHYSIOLOGIC STUDY  11/2013   Afib ablation x 2 (11/2013 and 10/2015) at [facility] by Dr Clyda Hurdle with recurrence post ablation   KNEE ARTHROSCOPY Left 1980's   "2" (11/12/2012)   KNEE SURGERY Left 1980's   "after 2 scopes they went in and did some kind of OR" (11/12/2012)   RIGHT/LEFT HEART CATH AND CORONARY ANGIOGRAPHY N/A 10/14/2020   Procedure: RIGHT/LEFT HEART CATH AND CORONARY ANGIOGRAPHY;  Surgeon: Martinique, Peter M, MD;  Location: [place] CV LAB;  Service: Cardiovascular;  Laterality: N/A;   TEE WITHOUT CARDIOVERSION N/A 07/22/2012   Procedure: TRANSESOPHAGEAL ECHOCARDIOGRAM (TEE);  Surgeon:  [name], MD;  Location: [facility] ENDOSCOPY;  Service: Cardiovascular;  Laterality: N/A;  Rm 2034   TEE WITHOUT CARDIOVERSION N/A 10/07/2013   Procedure: TRANSESOPHAGEAL ECHOCARDIOGRAM (TEE);  Surgeon: [name], MD;  Location: [facility] ENDOSCOPY;  Service: Cardiovascular;  Laterality: N/A;   TEE WITHOUT CARDIOVERSION N/A 07/12/2020   Procedure: TRANSESOPHAGEAL ECHOCARDIOGRAM (TEE);  Surgeon: Sanda Klein, MD;  Location: [place];  Service: Cardiovascular;  Laterality: N/A;   TOTAL KNEE ARTHROPLASTY Left 11/05/2014   Procedure: TOTAL KNEE ARTHROPLASTY;  Surgeon: Dorna Leitz, MD;  Location: [place];  Service: Orthopedics;  Laterality: Left;   TUBAL LIGATION  66's    Family History  Problem Relation Age of Onset   Lung cancer Mother    Hypertension Mother    Hyperlipidemia Father    Hypertension Father    Heart disease Brother     Social History   Socioeconomic History   Marital status: Married  Spouse name: Not on file   Number of children: Not on file   Years of education: Not on file   Highest education level: Not on file  Occupational History   Not on file  Tobacco Use   Smoking status: Never   Smokeless tobacco: Never  Substance and Sexual Activity   Alcohol use: No    Comment: 11/12/2012 "no alcohol in the last 9 years"   Drug use: No   Sexual activity: Not Currently  Other Topics Concern   Not on file  Social History Narrative   Pt lives in [place] with spouse.  Retired   Investment banker, operational of Radio broadcast assistant Strain: Not on [employer] Insecurity: Not on file  Transportation Needs: Not on file  Physical Activity: Not on file  Stress: Not on file  Social Connections: Not on file  Intimate Partner Violence: Not on file    Review of Systems  Constitutional:  Positive for fatigue. Negative for fever.  Respiratory:  Positive for apnea and shortness of breath. Negative for cough.   Cardiovascular:  Negative for chest pain.   Psychiatric/Behavioral:  Positive for sleep disturbance.    Vitals:   04/24/21 1004  BP: 120/78  Pulse: 67  Temp: 98.2 F (36.8 C)  SpO2: 98%     Physical Exam Constitutional:      Appearance: She is obese.  HENT:     Head: Normocephalic.     Mouth/Throat:     Mouth: Mucous membranes are moist.  Eyes:     General:        Right eye: No discharge.        Left eye: No discharge.  Cardiovascular:     Rate and Rhythm: Normal rate and regular rhythm.     Pulses: Normal pulses.     Heart sounds: Normal heart sounds. No murmur heard.   No friction rub.  Pulmonary:     Effort: Pulmonary effort is normal. No respiratory distress.     Breath sounds: No stridor. No wheezing, rhonchi or rales.  Musculoskeletal:     Cervical back: No rigidity or tenderness.  Neurological:     Mental Status: She is alert.  Psychiatric:        Mood and Affect: Mood normal.    Data Reviewed: PFT reviewed showing combined obstruction and restriction -Reviewed with the patient in the office today from 2021 and from 2018 -PFT from 2018 did reveal significant bronchodilator response  Previous sleep study significant for severe obstructive sleep apnea Echocardiogram significant for ejection fraction of 30 to 35% with moderately reduced right ventricular systolic function, severe pulmonary hypertension Recent sleep study reviewed showing severe obstructive sleep apnea, CPAP of 17   CPAP download  70% CPAP compliance Average use of 6 hours 47 minutes Current pressure of 17 AHI of 0.9  assessment:  Severe obstructive sleep apnea -Continue CPAP  -We will decrease the pressure from 17 to 15  -we will follow-up with downloads  Restrictive lung disease Combined with obstruction with significant bronchodilator response -Reduced TLC on PFT -Morbid obesity -Trial with Breo 100 -Airway hyperactivity may be contributing to ongoing cough  Heart failure with reduced ejection fraction Diastolic  heart failure Paroxysmal atrial fibrillation Hypertension -Dilated atria, pulmonary hypertension   Aortic valvulopathy Mitral valvulopathy  Deconditioning -This is better, more active  Persistent cough likely related to airway hyperactivity/hyperresponsiveness  Plan/Recommendations: Encouraged to continue CPAP use on a regular basis -Decrease pressure to 15  Graded exercise  as tolerated  Continue inhalers  Call us with significant concerns  I will follow-up with her in about 6 weeks for the cough and then for CPAP tolerance    Sherrilyn Rist MD [facility] 04/24/2021, 10:13 AM  CC: Glenis Smoker, *

## 2021-04-26 ENCOUNTER — Ambulatory Visit: Payer: Medicare Other | Admitting: Cardiovascular Disease

## 2021-05-01 ENCOUNTER — Encounter: Payer: Self-pay | Admitting: Cardiovascular Disease

## 2021-05-02 MED ORDER — WARFARIN SODIUM 4 MG PO TABS: ORAL_TABLET | ORAL | 0 refills | Status: DC

## 2021-05-09 ENCOUNTER — Other Ambulatory Visit: Payer: Self-pay

## 2021-05-09 ENCOUNTER — Ambulatory Visit (INDEPENDENT_AMBULATORY_CARE_PROVIDER_SITE_OTHER): Payer: Medicare Other | Admitting: *Deleted

## 2021-05-09 DIAGNOSIS — I4819 Other persistent atrial fibrillation: Secondary | ICD-10-CM | POA: Diagnosis not present

## 2021-05-09 DIAGNOSIS — Z5181 Encounter for therapeutic drug level monitoring: Secondary | ICD-10-CM | POA: Diagnosis not present

## 2021-05-09 LAB — POCT INR: INR: 1.5 — AB (ref 2.0–3.0)

## 2021-05-09 NOTE — Patient Instructions (Signed)
Description   -Take 1.5 tablets of warfarin today -Then START taking warfarin 2 tablets daily except for 1 tablet on Tuesday, Thursday and Saturday.  -Recheck INR in 2 weeks, Coumadin Clinic [phone]

## 2021-05-24 ENCOUNTER — Other Ambulatory Visit: Payer: Self-pay

## 2021-05-24 ENCOUNTER — Ambulatory Visit (INDEPENDENT_AMBULATORY_CARE_PROVIDER_SITE_OTHER): Payer: Medicare Other

## 2021-05-24 DIAGNOSIS — Z5181 Encounter for therapeutic drug level monitoring: Secondary | ICD-10-CM | POA: Diagnosis not present

## 2021-05-24 DIAGNOSIS — I480 Paroxysmal atrial fibrillation: Secondary | ICD-10-CM

## 2021-05-24 DIAGNOSIS — E785 Hyperlipidemia, unspecified: Secondary | ICD-10-CM | POA: Diagnosis not present

## 2021-05-24 DIAGNOSIS — I340 Nonrheumatic mitral (valve) insufficiency: Secondary | ICD-10-CM

## 2021-05-24 DIAGNOSIS — I4819 Other persistent atrial fibrillation: Secondary | ICD-10-CM | POA: Diagnosis not present

## 2021-05-24 LAB — BASIC METABOLIC PANEL
BUN/Creatinine Ratio: 27 (ref 12–28)
BUN: 26 mg/dL (ref 8–27)
CO2: 28 mmol/L (ref 20–29)
Calcium: 9.7 mg/dL (ref 8.7–10.3)
Chloride: 100 mmol/L (ref 96–106)
Creatinine, Ser: 0.95 mg/dL (ref 0.57–1.00)
Glucose: 84 mg/dL (ref 70–99)
Potassium: 4.5 mmol/L (ref 3.5–5.2)
Sodium: 140 mmol/L (ref 134–144)
eGFR: 62 mL/min/{1.73_m2} (ref >59)

## 2021-05-24 LAB — POCT INR: INR: 2.9 (ref 2.0–3.0)

## 2021-05-24 LAB — LIPID PANEL
Chol/HDL Ratio: 2.7 ratio (ref 0.0–4.4)
Cholesterol, Total: 170 mg/dL (ref 100–199)
HDL: 64 mg/dL (ref >39)
LDL Chol Calc (NIH): 89 mg/dL (ref 0–99)
Triglycerides: 94 mg/dL (ref 0–149)
VLDL Cholesterol Cal: 17 mg/dL (ref 5–40)

## 2021-05-24 NOTE — Patient Instructions (Signed)
-  Take 1 tablet of warfarin today -Then continue 2 tablets daily except for 1 tablet on Tuesday, Thursday and Saturday.  -Recheck INR in 4 weeks, Coumadin Clinic [phone]

## 2021-06-04 ENCOUNTER — Encounter: Payer: Self-pay | Admitting: Cardiovascular Disease

## 2021-06-05 NOTE — Telephone Encounter (Signed)
Suspect that it is atrial fibrillation. Can she please come in on Wednesday?

## 2021-06-07 ENCOUNTER — Other Ambulatory Visit: Payer: Self-pay

## 2021-06-07 ENCOUNTER — Ambulatory Visit (INDEPENDENT_AMBULATORY_CARE_PROVIDER_SITE_OTHER): Payer: Medicare Other | Admitting: Cardiovascular Disease

## 2021-06-07 ENCOUNTER — Encounter: Payer: Self-pay | Admitting: Cardiovascular Disease

## 2021-06-07 VITALS — BP 130/72 | HR 118 | Ht 64.5 in | Wt 217.8 lb

## 2021-06-07 DIAGNOSIS — J984 Other disorders of lung: Secondary | ICD-10-CM

## 2021-06-07 DIAGNOSIS — Z952 Presence of prosthetic heart valve: Secondary | ICD-10-CM

## 2021-06-07 DIAGNOSIS — I340 Nonrheumatic mitral (valve) insufficiency: Secondary | ICD-10-CM

## 2021-06-07 DIAGNOSIS — G4733 Obstructive sleep apnea (adult) (pediatric): Secondary | ICD-10-CM | POA: Diagnosis not present

## 2021-06-07 DIAGNOSIS — I5032 Chronic diastolic (congestive) heart failure: Secondary | ICD-10-CM | POA: Diagnosis not present

## 2021-06-07 DIAGNOSIS — I77810 Thoracic aortic ectasia: Secondary | ICD-10-CM

## 2021-06-07 DIAGNOSIS — I4819 Other persistent atrial fibrillation: Secondary | ICD-10-CM

## 2021-06-07 DIAGNOSIS — I700 Atherosclerosis of aorta: Secondary | ICD-10-CM

## 2021-06-07 DIAGNOSIS — E785 Hyperlipidemia, unspecified: Secondary | ICD-10-CM

## 2021-06-07 DIAGNOSIS — D6869 Other thrombophilia: Secondary | ICD-10-CM | POA: Diagnosis not present

## 2021-06-07 MED ORDER — CARVEDILOL 6.25 MG PO TABS: 6.2500 mg | ORAL_TABLET | Freq: Two times a day (BID) | ORAL | 3 refills | Status: DC

## 2021-06-07 MED ORDER — FUROSEMIDE 80 MG PO TABS: ORAL_TABLET | ORAL | 3 refills | Status: DC

## 2021-06-07 NOTE — Patient Instructions (Addendum)
Medication Instructions:  ?Take Carvedilol 3.125 twice daily for 5 days, ?After 5 days increase to 6.25 twice daily. ? ?*If you need a refill on your cardiac medications before your next appointment, please call your pharmacy* ? ? ?Follow-Up: ?At [facility], you and your health needs are our priority.  As part of our continuing mission to provide you with exceptional heart care, we have created designated Provider Care Teams.  These Care Teams include your primary Cardiologist (physician) and Advanced Practice Providers (APPs -  Physician Assistants and Nurse Practitioners) who all work together to provide you with the care you need, when you need it. ? ?We recommend signing up for the patient portal called "MyChart".  Sign up information is provided on this After Visit Summary.  MyChart is used to connect with patients for Virtual Visits (Telemedicine).  Patients are able to view lab/test results, encounter notes, upcoming appointments, etc.  Non-urgent messages can be sent to your provider as well.   ?To learn more about what you can do with MyChart, go to [email or web address].   ? ?Your next appointment:   ?3 month(s) ? ?The format for your next appointment:   ?In Person ? ?Provider:   ?Sanda Klein, MD   ? ? ?Other Instructions ?Referral to AFIB clinic- in 2 weeks. They will call you to set up an appointment. ?  ? ?

## 2021-06-07 NOTE — Progress Notes (Signed)
Cardiology Office Note:    Date:  06/09/2021   ID:  [name], DOB 07/20/1945, MRN [id number]  PCP:  Glenis Smoker, MD  [facility] Cardiologist:  Sanda Klein, MD  [place] Electrophysiologist:  None   Referring MD: Glenis Smoker, *   Chief Complaint  Patient presents with   Irregular Heart Beat     History of Present Illness:    MARIELL [name] is a 76 y.o. 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  She had been feeling well for several months and had a heart rate that was consistently around 60, recently her Fitbit has shown a persistently elevated heart rate around 120 bpm at rest.  This has been associated with increased fatigue and mild exertional dyspnea.  Her EKG today confirms atrial fibrillation rapid ventricular response at about 118 bpm.  Denies chest pain, dizziness, syncope and is really not aware of palpitations.  She does not have lower extremity edema, orthopnea or PND.  She has not had any falls, injuries or serious bleeding problems.  Previously poorly tolerant of metoprolol, which seem to cause depression, but she did okay with a low-dose of carvedilol in the past.  She was most recently hospitalized in June 2021 with acute heart failure exacerbation and her echocardiogram showed decreased LVEF to 30-35% as well as moderately  reduced right ventricular systolic function and severely elevated pulmonary artery hypertension, despite normal function of the aortic valve prosthesis.  The cause of LVEF decline was uncertain, but she responded well to discontinuation of diltiazem and treatment with diuretics, Entresto, carvedilol (the doses have been limited by low blood pressure).  In the past she reportedly did not tolerate beta-blockers due to depression, but the current low-dose of carvedilol seems to be working well for her. Follow-up echocardiogram performed in October 2021 showed only partial improvement in LVEF now up to 40-45% in the left ventricle remain dilated with an end-systolic diameter of 49 mm.  Mitral regurgitation remains severe with a central jet.  She underwent a TEE on 07/12/2020 that shows further improvement in LVEF which is now almost normal at 50-55%.  It also showed that the mitral regurgitation is central and squarely in the moderate to severe range (there was no reversal of flow in the pulmonary veins, all calculated parameters suggested higher and of moderate MR).  Anatomically, the valve appears appropriate for MitraClip, without major leaflet retraction and with a mean gradient of 3 mmHg at a heart rate around 100 bpm.  She underwent right and left heart catheterization on 10/14/2020.  This showed excellent hemodynamic values and normal pulmonary artery pressure.  The PAWP was only 10 mmHg and the V wave was not prominent.  It appeared that she did not require treatment for the mitral insufficiency at that point.  Follow-up transthoracic echocardiogram performed 02/07/2021 shows complete normalization of LVEF to 55%, moderate to severe mitral regurgitation, normal parameters of the mechanical aortic valve prosthesis with a mean gradient of 10 mmHg.  Pulmonary  function test performed in May 2021 were abnormal with restrictive findings: FEV1 of 39% of predicted, commensurate FVC 40% of predicted.  Her  pulmonary specialist is Dr. Ander Slade. She reports compliance with CPAP.    Past Medical History:  Diagnosis Date   Anxiety    Aortic stenosis    status post aortic valve replacement with St. Jude mechanical prosthesis   Ascending aorta dilatation ([place]) 06/25/2016   71mm by echo 06/2016 and 62mm by CT angin 2016   Chronic anticoagulation    Complication of anesthesia    pt states paralyzed diaphragm after AVR   Depression    DJD (degenerative joint disease) of knee    Dyslipidemia    GERD (gastroesophageal reflux disease)    "several years ago; none since" (11/12/2012)   Hyperlipidemia    Hyperlipidemia LDL goal <70 01/19/2017   Hypertension    Left atrial enlargement    Mitral regurgitation 06/25/2016   Mild moderate MR by echo 06/2016   Obesity    Persistent atrial fibrillation (HCC)    s/p afib ablation x 2 at [facility]   Sleep apnea    On CPAP at 12cm H2O   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 Jude AVR   CARDIOVERSION N/A 07/22/2012   Procedure: CARDIOVERSION;  Surgeon: [name], MD;  Location: [facility] ENDOSCOPY;  Service: Cardiovascular;  Laterality: N/A;   CARDIOVERSION N/A 11/25/2012   Procedure: CARDIOVERSION;  Surgeon: [name], MD;  Location: [place] ENDOSCOPY;  Service: Cardiovascular;  Laterality: N/A;   CARDIOVERSION N/A 12/30/2012   Procedure: CARDIOVERSION;  Surgeon: [name], MD;  Location: [place] ENDOSCOPY;  Service: Cardiovascular;  Laterality: N/A;  h&p in file-HW    CARDIOVERSION N/A 03/30/2013   Procedure: CARDIOVERSION;  Surgeon: [name], MD;  Location: [place] ENDOSCOPY;  Service: Cardiovascular;  Laterality: N/A;   ELECTROPHYSIOLOGIC STUDY  11/2013   Afib ablation x 2 (11/2013 and 10/2015) at [facility] by Dr Clyda Hurdle with recurrence post ablation    KNEE ARTHROSCOPY Left 1980's   "2" (11/12/2012)   KNEE SURGERY Left 1980's   "after 2 scopes they went in and did some kind of OR" (11/12/2012)   RIGHT/LEFT HEART CATH AND CORONARY ANGIOGRAPHY N/A 10/14/2020   Procedure: RIGHT/LEFT HEART CATH AND CORONARY ANGIOGRAPHY;  Surgeon: Martinique, Peter M, MD;  Location: [place] CV LAB;  Service: Cardiovascular;  Laterality: N/A;   TEE WITHOUT CARDIOVERSION N/A 07/22/2012   Procedure: TRANSESOPHAGEAL ECHOCARDIOGRAM (TEE);  Surgeon: [name], MD;  Location: [facility] ENDOSCOPY;  Service: Cardiovascular;  Laterality: N/A;  Rm 2034   TEE WITHOUT CARDIOVERSION N/A 10/07/2013   Procedure: TRANSESOPHAGEAL ECHOCARDIOGRAM (TEE);  Surgeon: [name], MD;  Location: [facility] ENDOSCOPY;  Service: Cardiovascular;  Laterality: N/A;   TEE WITHOUT CARDIOVERSION N/A 07/12/2020   Procedure: TRANSESOPHAGEAL ECHOCARDIOGRAM (TEE);  Surgeon: Sanda Klein, MD;  Location: [place];  Service: Cardiovascular;  Laterality: N/A;   TOTAL KNEE ARTHROPLASTY Left 11/05/2014   Procedure: TOTAL KNEE ARTHROPLASTY;  Surgeon: Dorna Leitz, MD;  Location: [place];  Service: Orthopedics;  Laterality: Left;   TUBAL LIGATION  1980's    Current Medications: Current Meds  Medication Sig   acetaminophen (TYLENOL) 500 MG tablet Take 1,000 mg by mouth every 6 (six)  hours as needed (pain).   buPROPion (WELLBUTRIN XL) 150 MG 24 hr tablet Take by mouth.   calcium-vitamin D (OSCAL WITH D) 500-200 MG-UNIT per tablet Take 2 tablets by mouth in the morning.   carvedilol (COREG) 6.25 MG tablet Take 1 tablet (6.25 mg total) by mouth 2 (two) times daily.   Cholecalciferol (VITAMIN D) 2000 UNITS tablet Take 4,000 Units by mouth in the morning.   ferrous sulfate 325 (65 FE) MG tablet Take 325 mg by mouth in the morning.   fluticasone furoate-vilanterol (BREO ELLIPTA) 100-25 MCG/ACT AEPB Inhale 1 puff into the lungs daily.   levothyroxine (SYNTHROID, LEVOTHROID) 100 MCG tablet Take 100 mcg daily before breakfast by  mouth.   Multiple Vitamin (MULTIVITAMIN WITH MINERALS) TABS Take 1 tablet by mouth in the morning.   oxymetazoline (AFRIN) 0.05 % nasal spray Place 1 spray into both nostrils daily as needed for congestion.   potassium chloride SA (KLOR-CON) 20 MEQ tablet TAKE 2 TABLETS(40 MEQ) BY MOUTH DAILY   rosuvastatin (CRESTOR) 20 MG tablet Take 1 tablet (20 mg total) by mouth daily.   sacubitril-valsartan (ENTRESTO) 97-103 MG Take 1 tablet by mouth 2 (two) times daily.   sertraline (ZOLOFT) 100 MG tablet Take 100 mg by mouth in the morning.   traZODone (DESYREL) 50 MG tablet Take 50-100 mg by mouth at bedtime as needed.   tretinoin (RETIN-A) 0.05 % cream Apply 1 application topically at bedtime. Applied to face   warfarin (COUMADIN) 4 MG tablet TAKE 1 TO 2 TABLETS BY MOUTH DAILY AS DIRECTED BY COUMADIN CLINIC   [DISCONTINUED] furosemide (LASIX) 40 MG tablet TAKE 1 TABLET BY MOUTH DAILY. OKAY TO TAKE EXTRA FOR WEIGHT GAIN> 3LB/ DAILY OR 5LB/WEEKLY (Patient taking differently: Take 80 mg by mouth daily.)     Allergies:   Codeine, Dilantin [phenytoin sodium extended], Metoprolol, and Propoxyphene   Social History   Socioeconomic History   Marital status: Married    Spouse name: Not on file   Number of children: Not on file   Years of education: Not on file   Highest education level: Not on file  Occupational History   Not on file  Tobacco Use   Smoking status: Never   Smokeless tobacco: Never  Substance and Sexual Activity   Alcohol use: No    Comment: 11/12/2012 "no alcohol in the last 9 years"   Drug use: No   Sexual activity: Not Currently  Other Topics Concern   Not on file  Social History Narrative   Pt lives in [place] with spouse.  Retired   Investment banker, operational of Radio broadcast assistant Strain: Not on [employer] Insecurity: Not on file  Transportation Needs: Not on file  Physical Activity: Not on file  Stress: Not on file  Social Connections: Not on file     Family  History: The patient's family history includes Heart disease in her brother; Hyperlipidemia in her father; Hypertension in her father and mother; Lung cancer in her mother.  ROS:   Please see the history of present illness.     All other systems reviewed and are negative.  EKGs/Labs/Other Studies Reviewed:    The following studies were reviewed today: TTE 2020/02/02 1. Systolic Index of contractility has improved from 669 to 865 mmHg/s.Marland Kitchen  Left ventricular ejection fraction, by estimation, is 35 to 40%. Left  ventricular ejection fraction by PLAX is 40 %. The left ventricle has  moderately decreased function. The left  ventricle demonstrates global  hypokinesis. The left ventricular internal  cavity size was mildly to moderately dilated. There is mild concentric  left ventricular hypertrophy. Left ventricular diastolic parameters are  indeterminate.   2. Right ventricular systolic function is moderately reduced. The right  ventricular size is moderately enlarged. There is moderately elevated  pulmonary artery systolic pressure. The estimated right ventricular  systolic pressure is 16.1 mmHg.   3. Left atrial size was severely dilated.   4. Right atrial size was severely dilated.   5. Central mitral regurgitation; suspect atrial dilation contributes to  mechanism . The mitral valve is grossly normal. Severe mitral valve  regurgitation.   6. Prosthetic DVI 0.33, AT < 100 ms. The aortic valve was not well  visualized. Aortic valve regurgitation is not visualized. There is a 21 mm  St. Jude St. Jude Reagent mechanical valve present in the aortic position.  Procedure Date: 2006.   7. Aortic dilatation noted. There is mild dilatation of the aortic root,  measuring 40 mm. There is moderate dilatation of the ascending aorta,  measuring 47 mm.   8. The inferior vena cava is normal in size with <50% respiratory  variability, suggesting right atrial pressure of 8 mmHg.   TEE 07/12/2020  1.  Left ventricular ejection fraction, by estimation, is 50 to 55%. The  left ventricle has low normal function. The left ventricle demonstrates  global hypokinesis. Left ventricular diastolic function could not be  evaluated.   2. Left atrial size was severely dilated. No left atrial/left atrial  appendage thrombus was detected. The LAA emptying velocity was 50 cm/s.  (the rhythm was atrial flutter).   3. Right atrial size was severely dilated.   4. The mitral valve is rheumatic. Moderate to severe mitral valve  regurgitation. No evidence of mitral stenosis. The mean mitral valve  gradient is 3.0 mmHg.   5. Tricuspid valve regurgitation is moderate.   6. The aortic valve has been repaired/replaced. Aortic valve  regurgitation is trivial. No aortic stenosis is present. There is a 19 mm  mechanical valve present in the aortic position. Echo findings are  consistent with normal structure and function of  the aortic valve prosthesis. Aortic valve mean gradient measures 3.0 mmHg.  Aortic valve Vmax measures 1.09 m/s.   7. Aortic dilatation noted. There is mild dilatation of the ascending  aorta, measuring 40 mm. There is Moderate (Grade III) atheroma plaque  involving the descending aorta.   8. Evidence of atrial level shunting detected by color flow Doppler.  There is a small patent foramen ovale with predominantly left to right  shunting across the atrial septum.   9. Right ventricular systolic function is normal. The right ventricular  size is normal. There is mildly elevated pulmonary artery systolic  pressure.   Echo 02/07/2021 1. Left ventricular ejection fraction, by estimation, is 55%. The left  ventricle has normal function. The left ventricle has no regional wall  motion abnormalities. Left ventricular diastolic parameters are consistent  with Grade II diastolic dysfunction  (pseudonormalization).   2. Mildly D-shaped septum consistent with RV pressure/volume overload.  Right  ventricular systolic function is moderately reduced. The right  ventricular size is mildly enlarged. There is normal pulmonary artery  systolic pressure. The estimated right  ventricular systolic pressure is 09.6 mmHg.   3. Left atrial size was moderately dilated.   4. Right atrial size was severely dilated.   5. The mitral valve is abnormal with thickening and calcification, the  posterior leaflet is  restricted. Possible rheumatic valve disease. There  is moderate to severe mitral valve regurgitation, PISA does not appear  accurate. No evidence of mitral  stenosis. Moderate mitral annular calcification.   6. St Jude mechanical aortic valve. Trivial regurgitation. Mean gradient  10 mmHg. Normal function.   7. Aortic dilatation noted. There is moderate dilatation of the ascending  aorta, measuring 45 mm.   8. The inferior vena cava is normal in size with greater than 50%  respiratory variability, suggesting right atrial pressure of 3 mmHg.   Comparison(s): 08/15/20 EF 35-40%. PA pressure 29mmHg. AV 40mmHg mean PG,  54mmHg peak PG. Moderate-severe MR.  Cardiac cath 10/12/2020 1. Normal coronary anatomy 2. Borderline pulmonary HTN. Mean PAP 22 mmHg 3. Normal PCWP mean 10 mm Hg. V wave does not appear prominent 4. Normal cardiac output.   Plan: resume anticoagulation. Continue medical therapy.  Fick Cardiac Output 8.73 L/min  Fick Cardiac Output Index 4.18 (L/min)/BSA  RA A Wave 5 mmHg  RA V Wave 6 mmHg  RA Mean 3 mmHg  RV Systolic Pressure 42 mmHg  RV Diastolic Pressure 0 mmHg  RV EDP 5 mmHg  PA Systolic Pressure 41 mmHg  PA Diastolic Pressure 11 mmHg  PA Mean 22 mmHg  PW A Wave 12 mmHg  PW V Wave 17 mmHg  PW Mean 10 mmHg  AO Systolic Pressure 979 mmHg  AO Diastolic Pressure 49 mmHg  AO Mean 76 mmHg  QP/QS 1  TPVR Index 5.26 HRUI  TSVR Index 18.19 HRUI  PVR SVR Ratio 0.16  TPVR/TSVR Ratio 0.29    EKG:  EKG is ordered today and shows atrial fibrillation with a  ventricular rate of 118 bpm, right bundle branch block and borderline QTc 501 ms Recent Labs: 08/14/2020: ALT 19 08/15/2020: B Natriuretic Peptide 598.4; Magnesium 2.1; TSH 4.836 04/12/2021: Hemoglobin 12.6; Platelets 192 05/24/2021: BUN 26; Creatinine, Ser 0.95; Potassium 4.5; Sodium 140  12/27/2020 Hemoglobin 12.0, creatinine 0.71, potassium 4.1, ALT 16, TSH 1.54 Recent Lipid Panel    Component Value Date/Time   CHOL 170 05/24/2021 1054   TRIG 94 05/24/2021 1054   HDL 64 05/24/2021 1054   CHOLHDL 2.7 05/24/2021 1054   LDLCALC 89 05/24/2021 1054   01/05/2021 cholesterol 205, HDL 54, LDL 126, triglycerides 136.  Physical Exam:    VS:  BP 130/72    Pulse (!) 118    Ht 5' 4.5" (1.638 m)    Wt 217 lb 12.8 oz (98.8 kg)    SpO2 97%    BMI 36.81 kg/m     Wt Readings from Last 3 Encounters:  06/07/21 217 lb 12.8 oz (98.8 kg)  04/24/21 217 lb 9.6 oz (98.7 kg)  04/12/21 253 lb 8.5 oz (115 kg)      General: Alert, oriented x3, no distress, appears reasonably comfortable Head: no evidence of trauma, PERRL, EOMI, no exophtalmos or lid lag, no myxedema, no xanthelasma; normal ears, nose and oropharynx Neck: normal jugular venous pulsations and no hepatojugular reflux; brisk carotid pulses without delay and no carotid bruits Chest: clear to auscultation, no signs of consolidation by percussion or palpation, normal fremitus, symmetrical and full respiratory excursions Cardiovascular: normal position and quality of the apical impulse, irregular rhythm, prosthetic valve clicks, 2/6 aortic ejection murmur is early peaking, 1/6 apical holosystolic murmur with limited radiation, no diastolic murmurs rubs or gallops Abdomen: no tenderness or distention, no masses by palpation, no abnormal pulsatility or arterial bruits, normal bowel sounds, no hepatosplenomegaly Extremities: no clubbing, cyanosis  or edema; 2+ radial, ulnar and brachial pulses bilaterally; 2+ right femoral, posterior tibial and dorsalis  pedis pulses; 2+ left femoral, posterior tibial and dorsalis pedis pulses; no subclavian or femoral bruits Neurological: grossly nonfocal Psych: Normal mood and affect ASSESSMENT:    1. Chronic diastolic CHF (congestive heart failure) ([place])   2. Nonrheumatic mitral (valve) insufficiency   3. Persistent atrial fibrillation ([place])   4. H/O mechanical aortic valve replacement   5. Acquired thrombophilia ([place])   6. OSA (obstructive sleep apnea)   7. Chronic restrictive lung disease   8. Ascending aorta dilatation (HCC)   9. Atherosclerosis of aorta ([place])   10. Hyperlipidemia LDL goal <70       PLAN:    In order of problems listed above:  CHF: Weight is unchanged and she appears to be clinically euvolemic, NYHA functional class 2.  We will continue the same dose of diuretic.  Clinically euvolemic on the current dose of diuretic.  NYHA functional class I-2.  Excellent hemodynamic parameters at the time of the right heart catheterization 10/14/2020 when she weighed 215 pounds.  Today her weight is almost exactly the same.   LVEF has returned to normal, suggesting that the mechanism may indeed have been tachycardia related, not related to mitral valve disease.  She does not have any signs or symptoms of coronary disease or any perfusion abnormalities on the nuclear stress test from May 2021.   MR: The murmur is not particularly impressive on physical exam.  By echo, continues to be moderate to severe even though LV systolic function has normalized.  The left ventricle is not dilated.  Improvement in LVEF suggest that MR was not the driving cause however.  At cardiac catheterization LV filling pressures were very low.  Anatomically, she does have appropriate findings for MitraClip should this become necessary in the future.  We will check another echocardiogram later this year. AFib: Having rapid ventricular rates again.  Previously did not do well with beta-blockers due to fatigue and depression, but  currently has rapid rates again and is at risk of developing tachycardia cardiomyopathy.  We will increase carvedilol to 6.25 mg twice daily.  We will have her follow-up in the A-fib clinic in a couple of weeks to see if additional dosing adjustments are necessary.  She keeps a very detailed record of her heart rates based on her Fitbit.  She has had 2 previous ablation procedures and has failed both amiodarone and dofetilide.  I do not think we have any other good antiarrhythmic alternatives.  Often has a relatively regular rhythm suggesting a competing junctional pacemaker.  Fully anticoagulated for mechanical valve. Mech AVR: Normal function.  Normal gradients across the mechanical prosthesis (mean gradient 10 mmHg for size 19 St. Jude Regent dual leaflet). Anticoagulation: Denies falls or bleeding issues.  Most recent INR 2.9 on February 15.  She does have a history of serious bleeding complications on 2 occasions (subdural hematoma 2006 and rectus sheath tumor 2019). OSA: Reports compliance with CPAP and denies daytime hypersomnolence. Restrictive lung disease: This may be a significant part of her chronic dyspnea.  Fairly significant reduction in FVC, at least in part attributable to obesity.  Follow-up with Dr. Ander Slade. Asc Ao dilation: CT scan in 2017 documented an ascending aorta diameter 4.3 cm, most recent transthoracic echo measured this at 4.7 cm.   The TEE did not confirm that, measuring a maximum diameter of only 4.0 cm.  We will clarify the diameter of her  aorta with a follow up CT angiogram.  This was scheduled for July 13, 2021. Aortic atherosclerosis: No evidence of significant aortic atherosclerosis on CT of chest in 2017, but abdominal aortic atherosclerosis is described on CT from 2019.   HLP: On statin.  It would be great if you could bring her LDL cholesterol down to less than 70, but since she does not have established coronary or peripheral vascular disease other than abdominal  aortic atherosclerosis I think LDL less than 100 is acceptable as well.   Medication Adjustments/Labs and Tests Ordered: Current medicines are reviewed at length with the patient today.  Concerns regarding medicines are outlined above.  Orders Placed This Encounter  Procedures   Amb Referral to AFIB Clinic   EKG 12-Lead     Meds ordered this encounter  Medications   furosemide (LASIX) 80 MG tablet    Sig: Take 1 tablet by mouth daily. OKAY TO TAKE EXTRA FOR WEIGHT GAIN> 3LB/ DAILY OR 5LB/WEEKLY    Dispense:  90 tablet    Refill:  3   carvedilol (COREG) 6.25 MG tablet    Sig: Take 1 tablet (6.25 mg total) by mouth 2 (two) times daily.    Dispense:  180 tablet    Refill:  3     Patient Instructions  Medication Instructions:  Take Carvedilol 3.125 twice daily for 5 days, After 5 days increase to 6.25 twice daily.  *If you need a refill on your cardiac medications before your next appointment, please call your pharmacy*   Follow-Up: At [facility], you and your health needs are our priority.  As part of our continuing mission to provide you with exceptional heart care, we have created designated Provider Care Teams.  These Care Teams include your primary Cardiologist (physician) and Advanced Practice Providers (APPs -  Physician Assistants and Nurse Practitioners) who all work together to provide you with the care you need, when you need it.  We recommend signing up for the patient portal called "MyChart".  Sign up information is provided on this After Visit Summary.  MyChart is used to connect with patients for Virtual Visits (Telemedicine).  Patients are able to view lab/test results, encounter notes, upcoming appointments, etc.  Non-urgent messages can be sent to your provider as well.   To learn more about what you can do with MyChart, go to [email or web address].    Your next appointment:   3 month(s)  The format for your next appointment:   In Person  Provider:    Sanda Klein, MD     Other Instructions Referral to AFIB clinic- in 2 weeks. They will call you to set up an appointment.      Signed, Sanda Klein, MD  06/09/2021 5:19 PM    [place]

## 2021-06-09 ENCOUNTER — Encounter: Payer: Self-pay | Admitting: Cardiovascular Disease

## 2021-06-12 ENCOUNTER — Encounter: Payer: Self-pay | Admitting: Cardiovascular Disease

## 2021-06-12 NOTE — Telephone Encounter (Signed)
Reviewed.  Carvedilol increased office appointment. ?

## 2021-06-19 ENCOUNTER — Other Ambulatory Visit: Payer: Self-pay

## 2021-06-19 ENCOUNTER — Ambulatory Visit (INDEPENDENT_AMBULATORY_CARE_PROVIDER_SITE_OTHER): Payer: Medicare Other | Admitting: Pulmonary Disease

## 2021-06-19 ENCOUNTER — Encounter: Payer: Self-pay | Admitting: Pulmonary Disease

## 2021-06-19 VITALS — BP 112/72 | HR 97 | Temp 98.5°F | Ht 64.0 in | Wt 218.0 lb

## 2021-06-19 DIAGNOSIS — G4733 Obstructive sleep apnea (adult) (pediatric): Secondary | ICD-10-CM | POA: Diagnosis not present

## 2021-06-19 DIAGNOSIS — Z9989 Dependence on other enabling machines and devices: Secondary | ICD-10-CM | POA: Diagnosis not present

## 2021-06-19 NOTE — Patient Instructions (Addendum)
I will see you in 6 months ? ?We will contact medical supply company to decrease your pressure from 17 down to 15 ? ?Continue with Breo ?-At some point, client stopped the [facility] to see whether the improvement in the cough is maintained ?-Leave of the Breo if you continue to feel well ?-If the cough recurs, just resume the [facility] and continue until you see [place] ? ? ?Call with significant concerns ?

## 2021-06-19 NOTE — Progress Notes (Signed)
[name]    [id number]    May 16, 1945  Primary Care Physician:Timberlake, Anastasia Pall, MD  Referring Physician: Glenis Smoker, MD [address] [place],  [place] [address]  Chief complaint:   Follow-up for daytime fatigue, shortness of breath Follow-up for cough-improved On treatment for sleep apnea-tolerating CPAP well  HPI:  Sleep apnea diagnosed in 2015, currently on CPAP of 17 Tolerating CPAP well -We had discussed decreasing pressure at the last visit -Remains on a pressure of 17 at present  She had a complaint about a cough which had lasted about 5 months at the last visit -She was started on Breo -PFT did reveal hyperactivity in the small airways -Cough is significantly improved  Recent chest x-ray was clear  She has been feeling well with using CPAP, remains compliant with use no concerns with CPAP tolerance  Recently hospitalized for respiratory failure and decompensated heart failure Feeling better  She is exercising regularly and has managed to lose about 30 pounds since her last visit  history of atrial fibrillation History of pulmonary hypertension History of asthma  History of aortic valve disease for which he had repair in the past-has a mechanical aortic valve  She does try to exercise with an elliptical device for about 30 minutes on a regular basis, this is done in a seated position -Has been having some issues with the atrial fibrillation with a heart rate a little higher -Continues on Coreg -Has follow-up with cardiology  Outpatient Encounter Medications as of 06/19/2021  Medication Sig   acetaminophen (TYLENOL) 500 MG tablet Take 1,000 mg by mouth every 6 (six) hours as needed (pain).   buPROPion (WELLBUTRIN XL) 150 MG 24 hr tablet Take by mouth.   calcium-vitamin D (OSCAL WITH D) 500-200 MG-UNIT per tablet Take 2 tablets by mouth in the morning.   carvedilol (COREG) 6.25 MG tablet Take 1 tablet (6.25 mg total) by  mouth 2 (two) times daily.   Cholecalciferol (VITAMIN D) 2000 UNITS tablet Take 4,000 Units by mouth in the morning.   ferrous sulfate 325 (65 FE) MG tablet Take 325 mg by mouth in the morning.   fluticasone furoate-vilanterol (BREO ELLIPTA) 100-25 MCG/ACT AEPB Inhale 1 puff into the lungs daily.   furosemide (LASIX) 80 MG tablet Take 1 tablet by mouth daily. OKAY TO TAKE EXTRA FOR WEIGHT GAIN> 3LB/ DAILY OR 5LB/WEEKLY   levothyroxine (SYNTHROID, LEVOTHROID) 100 MCG tablet Take 100 mcg daily before breakfast by mouth.   Multiple Vitamin (MULTIVITAMIN WITH MINERALS) TABS Take 1 tablet by mouth in the morning.   oxymetazoline (AFRIN) 0.05 % nasal spray Place 1 spray into both nostrils daily as needed for congestion.   potassium chloride SA (KLOR-CON) 20 MEQ tablet TAKE 2 TABLETS(40 MEQ) BY MOUTH DAILY   sacubitril-valsartan (ENTRESTO) 97-103 MG Take 1 tablet by mouth 2 (two) times daily.   sertraline (ZOLOFT) 100 MG tablet Take 100 mg by mouth in the morning.   traZODone (DESYREL) 50 MG tablet Take 50-100 mg by mouth at bedtime as needed.   tretinoin (RETIN-A) 0.05 % cream Apply 1 application topically at bedtime. Applied to face   warfarin (COUMADIN) 4 MG tablet TAKE 1 TO 2 TABLETS BY MOUTH DAILY AS DIRECTED BY COUMADIN CLINIC   rosuvastatin (CRESTOR) 20 MG tablet Take 1 tablet (20 mg total) by mouth daily.   No facility-administered encounter medications on file as of 06/19/2021.    Allergies as of 06/19/2021 - Review  Complete 06/19/2021  Allergen Reaction Noted   Codeine Anaphylaxis 07/17/2012   Dilantin [phenytoin sodium extended] Rash and Itching 07/17/2012   Metoprolol Other (See Comments) 10/23/2013   Propoxyphene Other (See Comments) 10/23/2013    Past Medical History:  Diagnosis Date   Anxiety    Aortic stenosis    status post aortic valve replacement with St. 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   Depression    DJD (degenerative joint disease) of knee    Dyslipidemia    GERD (gastroesophageal reflux disease)    "several years ago; none since" (11/12/2012)   Hyperlipidemia    Hyperlipidemia LDL goal <70 01/19/2017   Hypertension    Left atrial enlargement    Mitral regurgitation 06/25/2016   Mild moderate MR by echo 06/2016   Obesity    Persistent atrial fibrillation (HCC)    [facility]    On CPAP at 12cm H2O   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 Jude AVR   CARDIOVERSION N/A 07/22/2012   Procedure: CARDIOVERSION;  Surgeon: MaCandee FurbishMD;  [facility];  Service: Cardiovascular;  Laterality: N/A;   CARDIOVERSION N/A 11/25/2012   Procedure: CARDIOVERSION;  Surgeon: TrSueanne MargaritaMD;  [facility];  Service: Cardiovascular;  Laterality: N/A;   CARDIOVERSION N/A 12/30/2012   Procedure: CARDIOVERSION;  Surgeon: TrSueanne MargaritaMD;  [facility];  Service: Cardiovascular;  Laterality: N/A;  h&p in file-HW    CARDIOVERSION N/A 03/30/2013   Procedure: CARDIOVERSION;  Surgeon: TrSueanne MargaritaMD;  [facility];  Service: Cardiovascular;  Laterality: N/A;   ELECTROPHYSIOLOGIC STUDY  11/2013   [facility] post ablation   KNEE ARTHROSCOPY Left 1980's   "2" (11/12/2012)   KNEE SURGERY Left 1980's   "after 2 scopes they went in and did some kind of OR" (11/12/2012)   RIGHT/LEFT HEART CATH AND CORONARY ANGIOGRAPHY N/A 10/14/2020   Procedure: RIGHT/LEFT HEART CATH AND CORONARY ANGIOGRAPHY;  Surgeon: JoMartiniquePeter M, MD;  [facility]: [facility];  Service: Cardiovascular;  Laterality: N/A;   TEE WITHOUT CARDIOVERSION N/A 07/22/2012   Procedure: TRANSESOPHAGEAL ECHOCARDIOGRAM (TEE);  Surgeon: MaCandee FurbishMD;  [facility];  Service: Cardiovascular;  Laterality: N/A;  Rm 2034   TEE WITHOUT CARDIOVERSION N/A 10/07/2013   Procedure: TRANSESOPHAGEAL ECHOCARDIOGRAM (TEE);  Surgeon: MaCandee FurbishMD;  [facility];  Service: Cardiovascular;  Laterality: N/A;   TEE WITHOUT CARDIOVERSION N/A 07/12/2020   Procedure: TRANSESOPHAGEAL ECHOCARDIOGRAM (TEE);  Surgeon: CrSanda KleinMD;  Location: [facility] Service: Cardiovascular;  Laterality: N/A;   TOTAL KNEE ARTHROPLASTY Left 11/05/2014   Procedure: TOTAL KNEE ARTHROPLASTY;  Surgeon: JoDorna LeitzMD;  Location: [facility] Service: Orthopedics;  Laterality: Left;   TUBAL LIGATION  1912's  Family History  Problem Relation Age of Onset   Lung cancer Mother    Hypertension Mother  Hyperlipidemia Father    Hypertension Father    Heart disease Brother     Social History   Socioeconomic History   Marital status: Married    Spouse name: Not on file   Number of children: Not on file   Years of education: Not on file   Highest education level: Not on file  Occupational History   Not on file  Tobacco Use   Smoking status: Never   Smokeless tobacco: Never  Substance and Sexual Activity   Alcohol use: No    Comment: 11/12/2012 "no alcohol in the last 9 years"   Drug use: No   Sexual activity: Not Currently  Other Topics Concern   Not on file  Social History Narrative   Pt lives in [place] with spouse.  Retired   Investment banker, operational of Radio broadcast assistant Strain: Not on [employer] Insecurity: Not on file  Transportation Needs: Not on file  Physical Activity: Not on file  Stress: Not on file  Social Connections: Not on file  Intimate Partner Violence: Not on file    Review of Systems  Constitutional:  Positive for fatigue. Negative  for fever.  Respiratory:  Positive for apnea and shortness of breath. Negative for cough.   Cardiovascular:  Negative for chest pain.  Psychiatric/Behavioral:  Positive for sleep disturbance.    There were no vitals filed for this visit.    Physical Exam Constitutional:      Appearance: She is obese.  HENT:     Head: Normocephalic.     Mouth/Throat:     Mouth: Mucous membranes are moist.  Eyes:     General:        Right eye: No discharge.        Left eye: No discharge.  Cardiovascular:     Rate and Rhythm: Normal rate and regular rhythm.     Pulses: Normal pulses.     Heart sounds: Normal heart sounds. No murmur heard.   No friction rub.  Pulmonary:     Effort: Pulmonary effort is normal. No respiratory distress.     Breath sounds: No stridor. No wheezing, rhonchi or rales.  Musculoskeletal:     Cervical back: No rigidity or tenderness.  Neurological:     Mental Status: She is alert.  Psychiatric:        Mood and Affect: Mood normal.    Data Reviewed: PFT reviewed showing combined obstruction and restriction -Reviewed with the patient in the office today from 2021 and from 2018 -PFT from 2018 did reveal significant bronchodilator response  Previous sleep study significant for severe obstructive sleep apnea Echocardiogram significant for ejection fraction of 30 to 35% with moderately reduced right ventricular systolic function, severe pulmonary hypertension Recent sleep study reviewed showing severe obstructive sleep apnea, CPAP of 17   CPAP download  100% CPAP compliance Average use of 7 hours 19 minutes CPAP set at 17 Residual AHI of 0.5  assessment:  Severe obstructive sleep apnea  -Continue CPAP -We will decrease pressure from 17 down to 15 -Follow-up with downloads  Chronic cough -Significantly improved with Breo -We will continue Breo at present -At some point we will try to stop Breo  Restrictive lung disease Combined with obstruction with  significant bronchodilator response -Reduced TLC on PFT -PFT reviewed  Morbid obesity -Encourage weight loss efforts  Heart failure with reduced ejection fraction Diastolic heart failure Paroxysmal atrial fibrillation Hypertension -Dilated atria, pulmonary hypertension   Aortic valvulopathy Mitral valvulopathy  Deconditioning -This is better, more active -Continues to try to stay active  Persistent cough likely related to airway hyperactivity/hyperresponsiveness  Plan/Recommendations: Encouraged to continue CPAP use on a regular basis -Decrease pressure to 15  Graded exercise as tolerated  Continue inhalers -At some point we will try and start Breo  Call us with significant concerns  Follow-up in 6 months  Encouraged to give [place] a call if she has any concerns    Sherrilyn Rist MD [facility] 06/19/2021, 10:06 AM  CC: Glenis Smoker, *

## 2021-06-22 ENCOUNTER — Encounter (HOSPITAL_COMMUNITY): Payer: Self-pay | Admitting: Physician Assistant

## 2021-06-22 ENCOUNTER — Ambulatory Visit (INDEPENDENT_AMBULATORY_CARE_PROVIDER_SITE_OTHER): Payer: Medicare Other

## 2021-06-22 ENCOUNTER — Ambulatory Visit (HOSPITAL_COMMUNITY)
Admission: RE | Admit: 2021-06-22 | Discharge: 2021-06-22 | Disposition: A | Discharge status: Home / Self Care | Payer: Medicare Other | Source: Ambulatory Visit | Attending: Physician Assistant | Admitting: Physician Assistant

## 2021-06-22 ENCOUNTER — Other Ambulatory Visit: Payer: Self-pay

## 2021-06-22 VITALS — BP 132/78 | HR 56 | Ht 64.0 in | Wt 217.6 lb

## 2021-06-22 DIAGNOSIS — I480 Paroxysmal atrial fibrillation: Secondary | ICD-10-CM | POA: Diagnosis not present

## 2021-06-22 DIAGNOSIS — G4733 Obstructive sleep apnea (adult) (pediatric): Secondary | ICD-10-CM | POA: Diagnosis not present

## 2021-06-22 DIAGNOSIS — Z6837 Body mass index (BMI) 37.0-37.9, adult: Secondary | ICD-10-CM | POA: Diagnosis not present

## 2021-06-22 DIAGNOSIS — E669 Obesity, unspecified: Secondary | ICD-10-CM | POA: Insufficient documentation

## 2021-06-22 DIAGNOSIS — Z7901 Long term (current) use of anticoagulants: Secondary | ICD-10-CM | POA: Insufficient documentation

## 2021-06-22 DIAGNOSIS — I110 Hypertensive heart disease with heart failure: Secondary | ICD-10-CM | POA: Diagnosis not present

## 2021-06-22 DIAGNOSIS — I4821 Permanent atrial fibrillation: Secondary | ICD-10-CM | POA: Insufficient documentation

## 2021-06-22 DIAGNOSIS — D6869 Other thrombophilia: Secondary | ICD-10-CM | POA: Insufficient documentation

## 2021-06-22 DIAGNOSIS — I4819 Other persistent atrial fibrillation: Secondary | ICD-10-CM | POA: Diagnosis not present

## 2021-06-22 DIAGNOSIS — I5042 Chronic combined systolic (congestive) and diastolic (congestive) heart failure: Secondary | ICD-10-CM | POA: Diagnosis not present

## 2021-06-22 DIAGNOSIS — Z5181 Encounter for therapeutic drug level monitoring: Secondary | ICD-10-CM | POA: Diagnosis not present

## 2021-06-22 LAB — POCT INR: INR: 3.3 — AB (ref 2.0–3.0)

## 2021-06-22 NOTE — Patient Instructions (Signed)
-  HOLD TODAY ONLY ?-Then continue 2 tablets daily except for 1 tablet on Tuesday, Thursday and Saturday.  ?-Recheck INR in 4 weeks, Coumadin Clinic [phone] ?

## 2021-06-22 NOTE — Progress Notes (Signed)
? ? ?Primary Care Physician: Glenis Smoker, MD ?Primary Cardiologist: Dr Sallyanne Kuster  ?Primary Electrophysiologist: none ?Referring Physician: Dr Sallyanne Kuster  ? ? ?[name] is a 76 y.o. female with a history of OSA, HTN, aortic atherosclerosis, restrictive lung disease, combined chronic systolic and diastolic CHF, MR, s/p mechanical aortic valve 2006, HLD, atrial fibrillation who presents for consultation in the [facility].  The patient was initially diagnosed with atrial fibrillation remotely and has had two ablations and has also failed amiodarone and dofetilide. Felt to be permanent at this point. Patient is on warfarin for a CHADS2VASC score of 6. She was seen by Dr Sallyanne Kuster 06/07/21 and found to have an elevated heart rate. Her BB was increased. Her heart rates have been much better since her last visit. On her Fitbit, it is usually 60s-70s. She does have some fatigue but this is intermittent. She felt "great" yesterday.  ? ?Today, she denies symptoms of palpitations, chest pain, shortness of breath, orthopnea, PND, lower extremity edema, dizziness, presyncope, syncope, snoring, daytime somnolence, bleeding, or neurologic sequela. The patient is tolerating medications without difficulties and is otherwise without complaint today.  ? ? ?Atrial Fibrillation Risk Factors: ? ?she does have symptoms or diagnosis of sleep apnea. ?she is compliant with CPAP therapy. ?she does not have a history of rheumatic fever. ? ? ?she has a BMI of Body mass index is 37.35 kg/m?Marland KitchenMarland Kitchen ?Filed Weights  ? 06/22/21 1039  ?Weight: 98.7 kg  ? ? ?Family History  ?Problem Relation Age of Onset  ? Lung cancer Mother   ? Hypertension Mother   ? Hyperlipidemia Father   ? Hypertension Father   ? Heart disease Brother   ? ? ? ?Atrial Fibrillation Management history: ? ?Previous antiarrhythmic drugs: dofetilide, amiodarone  ?Previous cardioversions: 2014 x 3 ?Previous ablations: 2015, 2017 ?CHADS2VASC score:  6 ?Anticoagulation history: warfarin  ? ? ?Past Medical History:  ?Diagnosis Date  ? Anxiety   ? Aortic stenosis   ? status post aortic valve replacement with St. Jude mechanical prosthesis  ? 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 38m by echo 06/2016 and 441mby CT angin 2016  ? Chronic anticoagulation   ? Complication of anesthesia   ? 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 Depression   ? DJD (degenerative joint disease) of knee   ? Dyslipidemia   ? GERD (gastroesophageal reflux disease)   ? "several years ago; none since" (11/12/2012)  ? Hyperlipidemia   ? Hyperlipidemia LDL goal <70 01/19/2017  ? Hypertension   ? Left atrial enlargement   ? Mitral regurgitation 06/25/2016  ? Mild moderate MR by echo 06/2016  ? Obesity   ? Persistent atrial fibrillation ([facility]  ? [facility]? Sleep apnea   ? On CPAP at 12cm H2O  ? Subdural hematoma   ? in setting of INR greater than 2.2 shortly after AVR - now cleared by neurosurgery to maintain INR 2-2.5  ? ?Past Surgical History:  ?Procedure Laterality Date  ? BURR HOLE FOR SUBDURAL HEMATOMA  2006  ? CARDIAC VALVE REPLACEMENT  2006  ? St. Jude AVR  ? CARDIOVERSION N/A 07/22/2012  ? Procedure: CARDIOVERSION;  Surgeon: MaCandee FurbishMD;  [facility];  Service: Cardiovascular;  Laterality: N/A;  ? CARDIOVERSION N/A 11/25/2012  ? Procedure: CARDIOVERSION;  Surgeon: TrSueanne MargaritaMD;  Location: [facility]: Cardiovascular;  Laterality: N/A;  ? CARDIOVERSION N/A 12/30/2012  ?  Procedure: CARDIOVERSION;  Surgeon: [name], MD;  [facility]COPY;  Service: Cardiovascular;  Laterality: N/A;  h&p in file-HW   ? CARDIOVERSION N/A 03/30/2013  ? Procedure: CARDIOVERSION;  Surgeon: [name], MD;  Location: [place];  Service: Cardiovascular;  Laterality: N/A;  ? ELECTROPHYSIOLOGIC STUDY  11/2013  ? Afib ablation x 2 (11/2013 and 10/2015) at [facility] by Dr Clyda Hurdle with recurrence post ablation  ? KNEE ARTHROSCOPY Left 1980's  ? "2"  (11/12/2012)  ? KNEE SURGERY Left 1980's  ? "after 2 scopes they went in and did some kind of OR" (11/12/2012)  ? RIGHT/LEFT HEART CATH AND CORONARY ANGIOGRAPHY N/A 10/14/2020  ? Procedure: RIGHT/LEFT HEART CATH AND CORONARY ANGIOGRAPHY;  Surgeon: Martinique, Peter M, MD;  Location: [place] [facility];  Service: Cardiovascular;  Laterality: N/A;  ? TEE WITHOUT CARDIOVERSION N/A 07/22/2012  ? Procedure: TRANSESOPHAGEAL ECHOCARDIOGRAM (TEE);  Surgeon: [name], MD;  Location: [facility] ENDOSCOPY;  Service: Cardiovascular;  Laterality: N/A;  Rm 2034  ? TEE WITHOUT CARDIOVERSION N/A 10/07/2013  ? Procedure: TRANSESOPHAGEAL ECHOCARDIOGRAM (TEE);  Surgeon: [name], MD;  Location: [facility] ENDOSCOPY;  Service: Cardiovascular;  Laterality: N/A;  ? TEE WITHOUT CARDIOVERSION N/A 07/12/2020  ? Procedure: TRANSESOPHAGEAL ECHOCARDIOGRAM (TEE);  Surgeon: Sanda Klein, MD;  Location: [place];  Service: Cardiovascular;  Laterality: N/A;  ? TOTAL KNEE ARTHROPLASTY Left 11/05/2014  ? Procedure: TOTAL KNEE ARTHROPLASTY;  Surgeon: Dorna Leitz, MD;  Location: [place];  Service: Orthopedics;  Laterality: Left;  ? TUBAL LIGATION  1980's  ? ? ?Current Outpatient Medications  ?Medication Sig Dispense Refill  ? acetaminophen (TYLENOL) 500 MG tablet Take 1,000 mg by mouth every 6 (six) hours as needed (pain).    ? buPROPion (WELLBUTRIN XL) 150 MG 24 hr tablet Take by mouth.    ? calcium-vitamin D (OSCAL WITH D) 500-200 MG-UNIT per tablet Take 2 tablets by mouth in the morning.    ? carvedilol (COREG) 6.25 MG tablet Take 1 tablet (6.25 mg total) by mouth 2 (two) times daily. 180 tablet 3  ? Cholecalciferol (VITAMIN D) 2000 UNITS tablet Take 4,000 Units by mouth in the morning.    ? famotidine (PEPCID) 10 MG tablet Take 10 mg by mouth as needed for heartburn or indigestion.    ? ferrous sulfate 325 (65 FE) MG tablet Take 325 mg by mouth in the morning.    ? fluticasone furoate-vilanterol (BREO ELLIPTA) 100-25 MCG/ACT AEPB Inhale 1 puff into the lungs daily. 60  each 3  ? furosemide (LASIX) 80 MG tablet Take 1 tablet by mouth daily. OKAY TO TAKE EXTRA FOR WEIGHT GAIN> 3LB/ DAILY OR 5LB/WEEKLY 90 tablet 3  ? levothyroxine (SYNTHROID, LEVOTHROID) 100 MCG tablet Take 100 mcg daily before breakfast by mouth.    ? Multiple Vitamin (MULTIVITAMIN WITH MINERALS) TABS Take 1 tablet by mouth in the morning.    ? oxymetazoline (AFRIN) 0.05 % nasal spray Place 1 spray into both nostrils daily as needed for congestion.    ? potassium chloride SA (KLOR-CON) 20 MEQ tablet TAKE 2 TABLETS(40 MEQ) BY MOUTH DAILY 60 tablet 6  ? rosuvastatin (CRESTOR) 20 MG tablet Take 1 tablet (20 mg total) by mouth daily. 90 tablet 3  ? sacubitril-valsartan (ENTRESTO) 97-103 MG Take 1 tablet by mouth 2 (two) times daily.    ? sertraline (ZOLOFT) 100 MG tablet Take 100 mg by mouth in the morning.    ? traZODone (DESYREL) 50 MG tablet Take 50-100 mg by mouth at bedtime as  needed.    ? tretinoin (RETIN-A) 0.05 % cream Apply 1 application topically at bedtime. Applied to face    ? warfarin (COUMADIN) 4 MG tablet TAKE 1 TO 2 TABLETS BY MOUTH DAILY AS DIRECTED BY COUMADIN CLINIC 150 tablet 0  ? ?No current facility-administered medications for this encounter.  ? ? ?Allergies  ?Allergen Reactions  ? Codeine Anaphylaxis  ?  Jerking, involuntary ?jerking ?  ? Dilantin [Phenytoin Sodium Extended] Rash and Itching  ? Metoprolol Other (See Comments)  ?  depression ?  ? Propoxyphene Other (See Comments)  ?  Bad vivid dreams  ? ? ?Social History  ? ?Socioeconomic History  ? Marital status: Married  ?  Spouse name: Not on file  ? Number of children: Not on file  ? Years of education: Not on file  ? Highest education level: Not on file  ?Occupational History  ? Not on file  ?Tobacco Use  ? Smoking status: Never  ? Smokeless tobacco: Never  ? Tobacco comments:  ?  Never smoke 06/22/21  ?Substance and Sexual Activity  ? Alcohol use: No  ?  Comment: 11/12/2012 "no alcohol in the last 9 years"  ? Drug use: No  ? Sexual activity:  Not Currently  ?Other Topics Concern  ? Not on file  ?Social History Narrative  ? Pt lives in [place] with spouse.  Retired  ? ?Social Determinants of Health  ? ?Financial Resource Strain: Not on

## 2021-07-13 ENCOUNTER — Ambulatory Visit
Admission: RE | Admit: 2021-07-13 | Discharge: 2021-07-13 | Disposition: A | Discharge status: Home / Self Care | Payer: Medicare Other | Source: Ambulatory Visit | Attending: Cardiovascular Disease | Admitting: Cardiovascular Disease

## 2021-07-13 DIAGNOSIS — I712 Thoracic aortic aneurysm, without rupture, unspecified: Secondary | ICD-10-CM | POA: Diagnosis not present

## 2021-07-13 DIAGNOSIS — I7121 Aneurysm of the ascending aorta, without rupture: Secondary | ICD-10-CM

## 2021-07-13 MED ORDER — IOPAMIDOL (ISOVUE-370) INJECTION 76%
75.0000 mL | Freq: Once | INTRAVENOUS | Status: AC | PRN
  Administered 2021-07-13: 75 mL via INTRAVENOUS

## 2021-07-14 ENCOUNTER — Encounter: Payer: Self-pay | Admitting: Cardiovascular Disease

## 2021-07-18 ENCOUNTER — Encounter: Payer: Self-pay | Admitting: Cardiovascular Disease

## 2021-07-18 DIAGNOSIS — Z5181 Encounter for therapeutic drug level monitoring: Secondary | ICD-10-CM

## 2021-07-18 MED ORDER — DIGOXIN 125 MCG PO TABS: 0.1250 mg | ORAL_TABLET | Freq: Every day | ORAL | 3 refills | Status: DC

## 2021-07-18 NOTE — Telephone Encounter (Signed)
Heart rate is likely to go out of control again if we eliminate the carvedilol. ?Please reduce the carvedilol to 3.125 mg twice daily and start digoxin 0.125 mg daily. ?Please ask her again to give [place] heart rate and blood pressure. ?Check bmet and dig level (drawn before she takes her morning dose of dig) in 2 weeks please ?

## 2021-07-20 ENCOUNTER — Other Ambulatory Visit: Payer: Self-pay | Admitting: Cardiovascular Disease

## 2021-07-20 ENCOUNTER — Ambulatory Visit (INDEPENDENT_AMBULATORY_CARE_PROVIDER_SITE_OTHER): Payer: Medicare Other

## 2021-07-20 DIAGNOSIS — I4819 Other persistent atrial fibrillation: Secondary | ICD-10-CM

## 2021-07-20 DIAGNOSIS — I480 Paroxysmal atrial fibrillation: Secondary | ICD-10-CM

## 2021-07-20 DIAGNOSIS — Z5181 Encounter for therapeutic drug level monitoring: Secondary | ICD-10-CM | POA: Diagnosis not present

## 2021-07-20 LAB — POCT INR: INR: 4.7 — AB (ref 2.0–3.0)

## 2021-07-20 NOTE — Patient Instructions (Signed)
-  HOLD TODAY and TOMORROW ONLY ?-Then decrease to 1 tablet daily except for 2 tablets on Monday, Wednesday and Friday.  ?-Recheck INR in 2 weeks, Coumadin Clinic [phone] ?

## 2021-07-27 DIAGNOSIS — Z20822 Contact with and (suspected) exposure to covid-19: Secondary | ICD-10-CM | POA: Diagnosis not present

## 2021-07-27 DIAGNOSIS — H2513 Age-related nuclear cataract, bilateral: Secondary | ICD-10-CM | POA: Diagnosis not present

## 2021-08-04 ENCOUNTER — Ambulatory Visit (INDEPENDENT_AMBULATORY_CARE_PROVIDER_SITE_OTHER): Payer: Medicare Other

## 2021-08-04 DIAGNOSIS — Z5181 Encounter for therapeutic drug level monitoring: Secondary | ICD-10-CM

## 2021-08-04 DIAGNOSIS — I480 Paroxysmal atrial fibrillation: Secondary | ICD-10-CM

## 2021-08-04 DIAGNOSIS — I4819 Other persistent atrial fibrillation: Secondary | ICD-10-CM | POA: Diagnosis not present

## 2021-08-04 LAB — POCT INR: INR: 4.5 — AB (ref 2.0–3.0)

## 2021-08-04 NOTE — Patient Instructions (Signed)
-  HOLD TODAY and TOMORROW ONLY ?-Then decrease to 1 tablet daily except for 2 tablets on Wednesday.  ?-Recheck INR in 3 weeks, Coumadin Clinic [phone] ?

## 2021-08-05 ENCOUNTER — Other Ambulatory Visit: Payer: Self-pay | Admitting: Cardiovascular Disease

## 2021-08-12 DIAGNOSIS — Z20822 Contact with and (suspected) exposure to covid-19: Secondary | ICD-10-CM | POA: Diagnosis not present

## 2021-08-18 DIAGNOSIS — H26492 Other secondary cataract, left eye: Secondary | ICD-10-CM | POA: Diagnosis not present

## 2021-08-28 ENCOUNTER — Ambulatory Visit (INDEPENDENT_AMBULATORY_CARE_PROVIDER_SITE_OTHER): Payer: Medicare Other

## 2021-08-28 DIAGNOSIS — Z5181 Encounter for therapeutic drug level monitoring: Secondary | ICD-10-CM | POA: Diagnosis not present

## 2021-08-28 DIAGNOSIS — I480 Paroxysmal atrial fibrillation: Secondary | ICD-10-CM

## 2021-08-28 DIAGNOSIS — I4819 Other persistent atrial fibrillation: Secondary | ICD-10-CM

## 2021-08-28 LAB — POCT INR: INR: 6.4 — AB (ref 2.0–3.0)

## 2021-08-28 NOTE — Patient Instructions (Signed)
-  HOLD TODAY and TOMORROW AND Wednesday ONLY -Then decrease to 1 tablet daily.  -Recheck INR in 1 week, Coumadin Clinic [phone]

## 2021-09-01 ENCOUNTER — Ambulatory Visit (INDEPENDENT_AMBULATORY_CARE_PROVIDER_SITE_OTHER): Payer: Medicare Other

## 2021-09-01 DIAGNOSIS — H26491 Other secondary cataract, right eye: Secondary | ICD-10-CM | POA: Diagnosis not present

## 2021-09-01 DIAGNOSIS — Z5181 Encounter for therapeutic drug level monitoring: Secondary | ICD-10-CM

## 2021-09-01 DIAGNOSIS — I480 Paroxysmal atrial fibrillation: Secondary | ICD-10-CM

## 2021-09-01 DIAGNOSIS — I4819 Other persistent atrial fibrillation: Secondary | ICD-10-CM | POA: Diagnosis not present

## 2021-09-01 LAB — POCT INR: INR: 1.5 — AB (ref 2.0–3.0)

## 2021-09-01 NOTE — Patient Instructions (Signed)
-  TAKE 1.5 TABLETS TODAY ONLY -Then continue to 1 tablet daily.  -Recheck INR in 2 weeks, Coumadin Clinic [phone]

## 2021-09-10 NOTE — Progress Notes (Unsigned)
Cardiology Office Note:    Date:  09/13/2021   ID:  [name], DOB [birth year], MRN [id number]  PCP:  Glenis Smoker, MD  [facility] Cardiologist:  Sanda Klein, MD  [place] Electrophysiologist:  None   Referring MD: Glenis Smoker, *   Chief Complaint  Patient presents with   Fatigue     History of Present Illness:    [name] is a 76 y.o. 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  Not long ago, we had to reduce her dose of carvedilol and add digoxin due to complaints of low blood pressure.  Her Fitbit shows that she has good ventricular rate control with heart rates rarely exceeding 105 bpm.  She complains of fatigue.  NYHA functional class II, sometimes class III.  She enjoys gardening but has to stop and rest after planting just 1 flower.  She has also had problems with increased frequency of bowel movements, especially in the afternoon after she has lunch.  She may go 3 or 4 times in 1 afternoon in the stool is less formed.  She has complaints of vertigo (the room spins when she lies down in bed).  She does not have particularly prominent complaints of shortness of breath and denies orthopnea and PND.  She does not have lower extremity edema.  Her weight has been very stable (211 on her home scale, which corresponds to 215 pounds on our  office scale).  She has not had chest pain and denies syncope.    She complains of feeling depressed more often than in years past.  She has had a long course of depression and she feels that the sertraline is not helping her anymore.  She clearly has anhedonia (she no longer enjoys the crafts that she usually participates in) and has been crying a little bit.  She has not had any falls or bleeding problems.  Her INR was quite high, peaking at 6.4 and so her warfarin dose was decreased after which she has had subtherapeutic INR at 1.5.  The only drug interaction that may have caused this was the fact that she was taking antibiotic eyedrops.  INR today is back in therapeutic range at 2.8.  Previously poorly tolerant of metoprolol, which seemed to cause depression, so used carvedilol.  She called in April complaining of fatigue and borderline low blood pressures.  We reduced the carvedilol to 3.125 mg twice daily and added digoxin.  She was most recently hospitalized in June 2021 with acute heart failure exacerbation and her echocardiogram showed decreased LVEF to 30-35% as well as moderately reduced right ventricular systolic function and severely elevated pulmonary artery hypertension, despite normal function of the aortic valve prosthesis.  The cause of LVEF decline was uncertain, but she responded well to discontinuation of diltiazem and treatment with diuretics, Entresto, carvedilol (the doses have been limited by low blood pressure).  In the past she reportedly did not tolerate beta-blockers due to depression, but the current low-dose of  carvedilol seems to be working well for her. Follow-up echocardiogram performed in October 2021 showed only partial improvement in LVEF now up to 40-45% in the left ventricle remain dilated with an end-systolic diameter of 49 mm.  Mitral regurgitation remains severe with a central jet.  She underwent a TEE on 07/12/2020 that shows further improvement in LVEF which is  now almost normal at 50-55%.  It also showed that the mitral regurgitation is central and squarely in the moderate to severe range (there was no reversal of flow in the pulmonary veins, all calculated parameters suggested higher and of moderate MR).  Anatomically, the valve appears appropriate for MitraClip, without major leaflet retraction and with a mean gradient of 3 mmHg at a heart rate around 100 bpm.  She underwent right and left heart catheterization on 10/14/2020.  This showed excellent hemodynamic values and normal pulmonary artery pressure.  The PAWP was only 10 mmHg and the V wave was not prominent.  It appeared that she did not require treatment for the mitral insufficiency at that point.  Follow-up transthoracic echocardiogram performed 02/07/2021 shows complete normalization of LVEF to 55%, moderate to severe mitral regurgitation, normal parameters of the mechanical aortic valve prosthesis with a mean gradient of 10 mmHg.  Pulmonary function test performed in May 2021 were abnormal with restrictive findings: FEV1 of 39% of predicted, commensurate FVC 40% of predicted.  Her pulmonary specialist is Dr. Ander Slade. She reports compliance with CPAP.    Past Medical History:  Diagnosis Date   Anxiety    Aortic stenosis    status post aortic valve replacement with St. Jude mechanical prosthesis   Ascending aorta dilatation ([place]) 06/25/2016   70m by echo 06/2016 and 429mby CT angin 2016   Chronic anticoagulation    Complication of anesthesia    pt states paralyzed diaphragm after AVR   Depression    DJD (degenerative joint disease) of knee    Dyslipidemia    GERD (gastroesophageal reflux disease)    "several years ago; none since" (11/12/2012)   Hyperlipidemia    Hyperlipidemia LDL goal <70 01/19/2017   Hypertension    Left atrial enlargement    Mitral regurgitation 06/25/2016   Mild moderate MR by echo 06/2016   Obesity    Persistent atrial fibrillation (HCC)    [facility]    On CPAP at 12cm H2O   [place]   in setting of INR greater than 2.2 shortly after AVR - now cleared by neurosurgery to maintain INR 2-2.5    Past Surgical History:  Procedure Laterality Date   BURR HOLE FOR SUBDURAL HEMATOMA  2006   CARDIAC VALVE REPLACEMENT  2006   St. Jude AVR   CARDIOVERSION N/A 07/22/2012   Procedure: CARDIOVERSION;  Surgeon: MaCandee FurbishMD;  [facility];  Service: Cardiovascular;  Laterality: N/A;   CARDIOVERSION N/A 11/25/2012   Procedure: CARDIOVERSION;  Surgeon: TrSueanne MargaritaMD;  [facility];  Service: Cardiovascular;  Laterality: N/A;   CARDIOVERSION N/A 12/30/2012   Procedure: CARDIOVERSION;  Surgeon: TrSueanne MargaritaMD;  [facility];  Service: Cardiovascular;  Laterality: N/A;  h&p in file-HW    CARDIOVERSION N/A 03/30/2013   Procedure: CARDIOVERSION;  Surgeon: TrSueanne MargaritaMD;  [facility];  Service: Cardiovascular;  Laterality: N/A;   ELECTROPHYSIOLOGIC STUDY  11/2013   [facility]  recurrence post ablation   KNEE ARTHROSCOPY Left 1980's   "2" (11/12/2012)   KNEE SURGERY Left 1980's   "after 2 scopes they went in and did some kind of OR" (11/12/2012)   RIGHT/LEFT HEART CATH AND CORONARY ANGIOGRAPHY N/A 10/14/2020   Procedure: RIGHT/LEFT HEART CATH AND CORONARY ANGIOGRAPHY;  Surgeon: Martinique, Peter M, MD;  Location: [place] [facility];  Service: Cardiovascular;  Laterality: N/A;   TEE WITHOUT CARDIOVERSION N/A 07/22/2012   Procedure: TRANSESOPHAGEAL ECHOCARDIOGRAM (TEE);  Surgeon: [name], MD;  Location: [facility] ENDOSCOPY;  Service: Cardiovascular;  Laterality: N/A;  Rm 2034   TEE WITHOUT CARDIOVERSION N/A 10/07/2013   Procedure: TRANSESOPHAGEAL ECHOCARDIOGRAM (TEE);  Surgeon: [name], MD;  Location: [facility] ENDOSCOPY;  Service: Cardiovascular;  Laterality: N/A;   TEE WITHOUT CARDIOVERSION N/A 07/12/2020   Procedure:  TRANSESOPHAGEAL ECHOCARDIOGRAM (TEE);  Surgeon: Sanda Klein, MD;  Location: [place];  Service: Cardiovascular;  Laterality: N/A;   TOTAL KNEE ARTHROPLASTY Left 11/05/2014   Procedure: TOTAL KNEE ARTHROPLASTY;  Surgeon: Dorna Leitz, MD;  Location: [place];  Service: Orthopedics;  Laterality: Left;   TUBAL LIGATION  1980's    Current Medications: Current Meds  Medication Sig   buPROPion (WELLBUTRIN XL) 150 MG 24 hr tablet Take 150 mg by mouth daily.   calcium-vitamin D (OSCAL WITH D) 500-200 MG-UNIT per tablet Take 2 tablets by mouth in the morning.   carvedilol (COREG) 3.125 MG tablet Take 3.125 mg by mouth 2 (two) times daily with a meal.   Cholecalciferol (VITAMIN D) 2000 UNITS tablet Take 4,000 Units by mouth in the morning.   digoxin (LANOXIN) 0.125 MG tablet Take 1 tablet (0.125 mg total) by mouth daily.   escitalopram (LEXAPRO) 10 MG tablet Take 1 tablet (10 mg total) by mouth daily.   ferrous sulfate 325 (65 FE) MG tablet Take 325 mg by mouth in the morning.   furosemide (LASIX) 80 MG tablet Take 1 tablet by mouth daily. OKAY TO TAKE EXTRA FOR WEIGHT GAIN> 3LB/ DAILY OR 5LB/WEEKLY   levothyroxine (SYNTHROID, LEVOTHROID) 100 MCG tablet Take 100 mcg daily before breakfast by mouth.   Multiple Vitamin (MULTIVITAMIN WITH MINERALS) TABS Take 1 tablet by mouth in the morning.   potassium chloride SA (KLOR-CON) 20 MEQ tablet TAKE 2 TABLETS(40 MEQ) BY MOUTH DAILY   rosuvastatin (CRESTOR) 20 MG tablet Take 1 tablet (20 mg total) by mouth daily.   sacubitril-valsartan (ENTRESTO) 97-103 MG TAKE 1 TABLET BY MOUTH TWICE DAILY   traZODone (DESYREL) 50 MG tablet Take 50-100 mg by mouth at bedtime as needed.   tretinoin (RETIN-A) 0.05 % cream Apply 1 application topically at bedtime. Applied to face   warfarin (COUMADIN) 4 MG tablet TAKE 1 TO 2 TABLETS BY MOUTH DAILY AS DIRECTED BY COUMADIN CLINIC   [DISCONTINUED] sertraline (ZOLOFT) 100 MG tablet Take 100 mg by mouth in the morning.      Allergies:   Codeine, Dilantin [phenytoin sodium extended], Metoprolol, and Propoxyphene   Social History   Socioeconomic History   Marital status: Married    Spouse name: Not on file   Number of children: Not on file   Years of education: Not on file   Highest education level: Not on file  Occupational History   Not on file  Tobacco Use   Smoking status: Never   Smokeless tobacco: Never   Tobacco comments:    Never smoke 06/22/21  Substance and Sexual Activity   Alcohol use: No    Comment: 11/12/2012 "no alcohol  in the last 9 years"   Drug use: No   Sexual activity: Not Currently  Other Topics Concern   Not on file  Social History Narrative   Pt lives in [place] with spouse.  Retired   Investment banker, operational of Radio broadcast assistant Strain: Not on [employer] Insecurity: Not on file  Transportation Needs: Not on file  Physical Activity: Not on file  Stress: Not on file  Social Connections: Not on file     Family History: The patient's family history includes Heart disease in her brother; Hyperlipidemia in her father; Hypertension in her father and mother; Lung cancer in her mother.  ROS:   Please see the history of present illness.     All other systems reviewed and are negative.  EKGs/Labs/Other Studies Reviewed:    The following studies were reviewed today: TTE 2020/02/02 1. Systolic Index of contractility has improved from 669 to 865 mmHg/s.Marland Kitchen  Left ventricular ejection fraction, by estimation, is 35 to 40%. Left  ventricular ejection fraction by PLAX is 40 %. The left ventricle has  moderately decreased function. The left  ventricle demonstrates global hypokinesis. The left ventricular internal  cavity size was mildly to moderately dilated. There is mild concentric  left ventricular hypertrophy. Left ventricular diastolic parameters are  indeterminate.   2. Right ventricular systolic function is moderately reduced. The right  ventricular size is  moderately enlarged. There is moderately elevated  pulmonary artery systolic pressure. The estimated right ventricular  systolic pressure is 93.7 mmHg.   3. Left atrial size was severely dilated.   4. Right atrial size was severely dilated.   5. Central mitral regurgitation; suspect atrial dilation contributes to  mechanism . The mitral valve is grossly normal. Severe mitral valve  regurgitation.   6. Prosthetic DVI 0.33, AT < 100 ms. The aortic valve was not well  visualized. Aortic valve regurgitation is not visualized. There is a 21 mm  St. Jude St. Jude Reagent mechanical valve present in the aortic position.  Procedure Date: 2006.   7. Aortic dilatation noted. There is mild dilatation of the aortic root,  measuring 40 mm. There is moderate dilatation of the ascending aorta,  measuring 47 mm.   8. The inferior vena cava is normal in size with <50% respiratory  variability, suggesting right atrial pressure of 8 mmHg.   TEE 07/12/2020  1. Left ventricular ejection fraction, by estimation, is 50 to 55%. The  left ventricle has low normal function. The left ventricle demonstrates  global hypokinesis. Left ventricular diastolic function could not be  evaluated.   2. Left atrial size was severely dilated. No left atrial/left atrial  appendage thrombus was detected. The LAA emptying velocity was 50 cm/s.  (the rhythm was atrial flutter).   3. Right atrial size was severely dilated.   4. The mitral valve is rheumatic. Moderate to severe mitral valve  regurgitation. No evidence of mitral stenosis. The mean mitral valve  gradient is 3.0 mmHg.   5. Tricuspid valve regurgitation is moderate.   6. The aortic valve has been repaired/replaced. Aortic valve  regurgitation is trivial. No aortic stenosis is present. There is a 19 mm  mechanical valve present in the aortic position. Echo findings are  consistent with normal structure and function of  the aortic valve prosthesis. Aortic valve mean  gradient measures 3.0 mmHg.  Aortic valve Vmax measures 1.09 m/s.   7. Aortic dilatation noted. There is mild dilatation of the ascending  aorta, measuring 40 mm. There is Moderate (Grade III) atheroma plaque  involving the descending aorta.   8. Evidence of atrial level shunting detected by color flow Doppler.  There is a small patent foramen ovale with predominantly left to right  shunting across the atrial septum.   9. Right ventricular systolic function is normal. The right ventricular  size is normal. There is mildly elevated pulmonary artery systolic  pressure.   Echo 02/07/2021 1. Left ventricular ejection fraction, by estimation, is 55%. The left  ventricle has normal function. The left ventricle has no regional wall  motion abnormalities. Left ventricular diastolic parameters are consistent  with Grade II diastolic dysfunction  (pseudonormalization).   2. Mildly D-shaped septum consistent with RV pressure/volume overload.  Right ventricular systolic function is moderately reduced. The right  ventricular size is mildly enlarged. There is normal pulmonary artery  systolic pressure. The estimated right  ventricular systolic pressure is 16.1 mmHg.   3. Left atrial size was moderately dilated.   4. Right atrial size was severely dilated.   5. The mitral valve is abnormal with thickening and calcification, the  posterior leaflet is restricted. Possible rheumatic valve disease. There  is moderate to severe mitral valve regurgitation, PISA does not appear  accurate. No evidence of mitral  stenosis. Moderate mitral annular calcification.   6. St Jude mechanical aortic valve. Trivial regurgitation. Mean gradient  10 mmHg. Normal function.   7. Aortic dilatation noted. There is moderate dilatation of the ascending  aorta, measuring 45 mm.   8. The inferior vena cava is normal in size with greater than 50%  respiratory variability, suggesting right atrial pressure of 3 mmHg.    Comparison(s): 08/15/20 EF 35-40%. PA pressure 57mHg. AV 163mg mean PG,  3356m peak PG. Moderate-severe MR.  Cardiac cath 10/12/2020 1. Normal coronary anatomy 2. Borderline pulmonary HTN. Mean PAP 22 mmHg 3. Normal PCWP mean 10 mm Hg. V wave does not appear prominent 4. Normal cardiac output.   Plan: resume anticoagulation. Continue medical therapy.  Fick Cardiac Output 8.73 L/min  Fick Cardiac Output Index 4.18 (L/min)/BSA  RA A Wave 5 mmHg  RA V Wave 6 mmHg  RA Mean 3 mmHg  RV Systolic Pressure 42 mmHg  RV Diastolic Pressure 0 mmHg  RV EDP 5 mmHg  PA Systolic Pressure 41 mmHg  PA Diastolic Pressure 11 mmHg  PA Mean 22 mmHg  PW A Wave 12 mmHg  PW V Wave 17 mmHg  PW Mean 10 mmHg  AO Systolic Pressure 111096Hg  AO Diastolic Pressure 49 mmHg  AO Mean 76 mmHg  QP/QS 1  TPVR Index 5.26 HRUI  TSVR Index 18.19 HRUI  PVR SVR Ratio 0.16  TPVR/TSVR Ratio 0.29    EKG:  EKG is not ordered today and reviewed the study from 06/22/2021 which shows atrial fibrillation with a ventricular rate of 118 bpm, right bundle branch block and borderline QTc 501 ms Recent Labs: 04/12/2021: Hemoglobin 12.6; Platelets 192 09/12/2021: BUN 19; Creatinine, Ser 1.08; Potassium 4.1; Sodium 141; TSH 2.910  12/27/2020 Hemoglobin 12.0, creatinine 0.71, potassium 4.1, ALT 16, TSH 1.54 Recent Lipid Panel    Component Value Date/Time   CHOL 170 05/24/2021 1054   TRIG 94 05/24/2021 1054   HDL 64 05/24/2021 1054   CHOLHDL 2.7 05/24/2021 1054   LDLCALC 89 05/24/2021 1054   01/05/2021 cholesterol 205, HDL 54, LDL 126, triglycerides 136.  Physical Exam:    VS:  BP 106/74 (BP Location: Left Arm, Patient  Position: Sitting, Cuff Size: Large)   Pulse 67   Ht '5\' 4"'$  (1.626 m)   Wt 215 lb (97.5 kg)   SpO2 95%   BMI 36.90 kg/m     Wt Readings from Last 3 Encounters:  09/12/21 215 lb (97.5 kg)  06/22/21 217 lb 9.6 oz (98.7 kg)  06/19/21 218 lb (98.9 kg)      General: Alert, oriented x3, no distress,  obese Head: no evidence of trauma, PERRL, EOMI, no exophtalmos or lid lag, no myxedema, no xanthelasma; normal ears, nose and oropharynx Neck: normal jugular venous pulsations and no hepatojugular reflux; brisk carotid pulses without delay and no carotid bruits Chest: clear to auscultation, no signs of consolidation by percussion or palpation, normal fremitus, symmetrical and full respiratory excursions Cardiovascular: normal position and quality of the apical impulse, irregular rhythm, normal first and second heart sounds, very faint apical holosystolic murmur, no diastolic murmurs, rubs or gallops Abdomen: no tenderness or distention, no masses by palpation, no abnormal pulsatility or arterial bruits, normal bowel sounds, no hepatosplenomegaly Extremities: no clubbing, cyanosis or edema; 2+ radial, ulnar and brachial pulses bilaterally; 2+ right femoral, posterior tibial and dorsalis pedis pulses; 2+ left femoral, posterior tibial and dorsalis pedis pulses; no subclavian or femoral bruits Neurological: grossly nonfocal Psych: Normal mood and affect  ASSESSMENT:    1. Chronic diastolic CHF (congestive heart failure) ([place])   2. Nonrheumatic mitral (valve) insufficiency   3. Persistent atrial fibrillation ([place])   4. H/O mechanical aortic valve replacement   5. Acquired thrombophilia ([place])   6. OSA (obstructive sleep apnea)   7. Chronic restrictive lung disease   8. Ascending aorta dilatation (HCC)   9. Atherosclerosis of aorta ([place])   10. Hypercholesterolemia   11. Depression, unspecified depression type        PLAN:    In order of problems listed above:  CHF: Weight is unchanged and she appears to be clinically euvolemic, NYHA functional class 2-3.  Exercise tolerance has diminished but the complaint is primarily fatigue rather than dyspnea.  Check labs today.  Would like to continue the same dose of diuretic.  Excellent hemodynamic parameters at the time of the right heart  catheterization 10/14/2020 when she weighed 215 pounds.  Today her weight is almost exactly the same.   LVEF has returned to normal, suggesting that the mechanism may indeed have been tachycardia related, not related to mitral valve disease.  She does not have any signs or symptoms of coronary disease or any perfusion abnormalities on the nuclear stress test from May 2021.   MR: On physical exam the murmur remains very unimpressive.  By echo, continues to be moderate to severe even though LV systolic function has normalized.  The left ventricle is not dilated.  Improvement in LVEF suggest that MR was not the driving cause however.  Plan to repeat an echocardiogram in November 2023.  At cardiac catheterization LV filling pressures were very low.  Anatomically, she does have appropriate findings for MitraClip should this become necessary in the future.   AFib: She has today whether I think there is any chance she could return to normal rhythm.  She asked whether we should try dofetilide again or another ablation.  Rate control is appropriate on the current dose of carvedilol and digoxin.  She keeps a very detailed record of her heart rates based on her Fitbit.  She has had 2 previous ablation procedures and has failed both amiodarone and dofetilide.  I do  not think we have any other good antiarrhythmic alternatives.  Often has a relatively regular rhythm suggesting a competing junctional pacemaker.  Fully anticoagulated for mechanical valve.  I think she should have a discussion with an electrophysiologist before any attempts to return to normal rhythm (personally I think the odds of successful return of sinus rhythm are extremely low) Digoxin: Labs checked today show that her digoxin level is at the upper limit of "therapeutic".  It is possible that some of her fatigue, listlessness and diarrhea is related to toxic effects of digoxin.  We will reduce the dose of digoxin to 0.25 mg every other day (the results came  back after she had left the clinic). Mech AVR: Normal function.  Normal gradients across the mechanical prosthesis (mean gradient 10 mmHg for size 19 St. Jude [employer] dual leaflet).  Recheck echocardiogram in November. Anticoagulation: Denies falls or bleeding issues.  Had some very broad swings in INR last month, the only culprit I can identify the fact that she was using antibiotic eyedrops for several days.  She has stopped these and she is now back in therapeutic range with her anticoagulation.  She does have a history of serious bleeding complications on 2 occasions (subdural hematoma 2006 and rectus sheath tumor 2019). OSA: She is compliant with CPAP and does not have daytime hypersomnolence Restrictive lung disease: PFTs from 2018 through 2021 showed reductions in FVC of 40-56% of predicted.  This may be a significant part of her chronic dyspnea.  Fairly significant reduction in FVC, at least in part attributable to obesity.  Also has moderate thoracic dextroscoliosis.  Follow-up with Dr. Ander Slade. Asc Ao dilation: CT scan in 2017 documented an ascending aorta diameter 4.3 cm, CT that we performed on 046 2023 shows exactly the same measurement of 4.3 cm. Aortic atherosclerosis: Mild atherosclerosis of the thoracic aorta on the most recent CT April 2023, and abdominal aortic atherosclerosis is described on CT from 2019.   HLP: She is on statin.  She does have aortic atherosclerosis but no coronary disease, history of stroke, angina or revascularization procedures.  Target LDL less than 100 appears appropriate. Depression: She does not have any suicidal thoughts or actions.  But clearly has worsened depression.  Her diarrhea may be partly related to relatively high dose of sertraline (although this can also be explained by her digoxin).  We will stop the sertraline and switch to escitalopram 10 mg once daily.  She will follow-up with Dr. Lindell Noe who may decide to increase the dose of escitalopram to 20  mg daily if appropriate.  Medication Adjustments/Labs and Tests Ordered: Current medicines are reviewed at length with the patient today.  Concerns regarding medicines are outlined above.  Orders Placed This Encounter  Procedures   Basic metabolic panel   TSH   Digoxin level   Ambulatory referral to Cardiac Electrophysiology     Meds ordered this encounter  Medications   escitalopram (LEXAPRO) 10 MG tablet    Sig: Take 1 tablet (10 mg total) by mouth daily.    Dispense:  90 tablet    Refill:  0     Patient Instructions  Medication Instructions:  STOP the Zoloft  START Lexapro 10 mg once daily  *If you need a refill on your cardiac medications before your next appointment, please call your pharmacy*   Lab Work: Your provider would like for you to have the following labs today: TSH, Digoxin, BMET  If you have labs (blood work) drawn today  and your tests are completely normal, you will receive your results only by: MyChart Message (if you have MyChart) OR A paper copy in the mail If you have any lab test that is abnormal or we need to change your treatment, we will call you to review the results.   Testing/Procedures: Your physician has requested that you have an echocardiogram in November. Echocardiography is a painless test that uses sound waves to create images of your heart. It provides your doctor with information about the size and shape of your heart and how well your heart's chambers and valves are working. You may receive an ultrasound enhancing agent through an IV if needed to better visualize your heart during the echo.This procedure takes approximately one hour. There are no restrictions for this procedure. This will take place at the [address], [address].     Follow-Up: At [facility], you and your health needs are our priority.  As part of our continuing mission to provide you with exceptional heart care, we have created designated Provider Care  Teams.  These Care Teams include your primary Cardiologist (physician) and Advanced Practice Providers (APPs -  Physician Assistants and Nurse Practitioners) who all work together to provide you with the care you need, when you need it.  We recommend signing up for the patient portal called "MyChart".  Sign up information is provided on this After Visit Summary.  MyChart is used to connect with patients for Virtual Visits (Telemedicine).  Patients are able to view lab/test results, encounter notes, upcoming appointments, etc.  Non-urgent messages can be sent to your provider as well.   To learn more about what you can do with MyChart, go to [email or web address].    Your next appointment:   Follow up in November in with Dr. Sallyanne Kuster after the ECHO A referral has been made to EP     Signed, Sanda Klein, MD  09/13/2021 5:09 PM    [place]

## 2021-09-12 ENCOUNTER — Ambulatory Visit (INDEPENDENT_AMBULATORY_CARE_PROVIDER_SITE_OTHER): Payer: Medicare Other

## 2021-09-12 ENCOUNTER — Ambulatory Visit (INDEPENDENT_AMBULATORY_CARE_PROVIDER_SITE_OTHER): Payer: Medicare Other | Admitting: Cardiovascular Disease

## 2021-09-12 ENCOUNTER — Encounter: Payer: Self-pay | Admitting: Cardiovascular Disease

## 2021-09-12 VITALS — BP 106/74 | HR 67 | Ht 64.0 in | Wt 215.0 lb

## 2021-09-12 DIAGNOSIS — I480 Paroxysmal atrial fibrillation: Secondary | ICD-10-CM

## 2021-09-12 DIAGNOSIS — D6869 Other thrombophilia: Secondary | ICD-10-CM | POA: Diagnosis not present

## 2021-09-12 DIAGNOSIS — G4733 Obstructive sleep apnea (adult) (pediatric): Secondary | ICD-10-CM

## 2021-09-12 DIAGNOSIS — F32A Depression, unspecified: Secondary | ICD-10-CM | POA: Diagnosis not present

## 2021-09-12 DIAGNOSIS — Z952 Presence of prosthetic heart valve: Secondary | ICD-10-CM | POA: Diagnosis not present

## 2021-09-12 DIAGNOSIS — Z5181 Encounter for therapeutic drug level monitoring: Secondary | ICD-10-CM | POA: Diagnosis not present

## 2021-09-12 DIAGNOSIS — I700 Atherosclerosis of aorta: Secondary | ICD-10-CM | POA: Diagnosis not present

## 2021-09-12 DIAGNOSIS — E7800 Pure hypercholesterolemia, unspecified: Secondary | ICD-10-CM | POA: Diagnosis not present

## 2021-09-12 DIAGNOSIS — I4819 Other persistent atrial fibrillation: Secondary | ICD-10-CM | POA: Diagnosis not present

## 2021-09-12 DIAGNOSIS — J984 Other disorders of lung: Secondary | ICD-10-CM

## 2021-09-12 DIAGNOSIS — I340 Nonrheumatic mitral (valve) insufficiency: Secondary | ICD-10-CM | POA: Diagnosis not present

## 2021-09-12 DIAGNOSIS — I77810 Thoracic aortic ectasia: Secondary | ICD-10-CM | POA: Diagnosis not present

## 2021-09-12 DIAGNOSIS — I5032 Chronic diastolic (congestive) heart failure: Secondary | ICD-10-CM

## 2021-09-12 DIAGNOSIS — E785 Hyperlipidemia, unspecified: Secondary | ICD-10-CM

## 2021-09-12 LAB — POCT INR: INR: 2.8 (ref 2.0–3.0)

## 2021-09-12 MED ORDER — ESCITALOPRAM OXALATE 10 MG PO TABS: 10.0000 mg | ORAL_TABLET | Freq: Every day | ORAL | 0 refills | Status: DC

## 2021-09-12 NOTE — Patient Instructions (Signed)
Medication Instructions:  STOP the Zoloft  START Lexapro 10 mg once daily  *If you need a refill on your cardiac medications before your next appointment, please call your pharmacy*   Lab Work: Your provider would like for you to have the following labs today: TSH, Digoxin, BMET  If you have labs (blood work) drawn today and your tests are completely normal, you will receive your results only by: [place] (if you have MyChart) OR A paper copy in the mail If you have any lab test that is abnormal or we need to change your treatment, we will call you to review the results.   Testing/Procedures: Your physician has requested that you have an echocardiogram in November. Echocardiography is a painless test that uses sound waves to create images of your heart. It provides your doctor with information about the size and shape of your heart and how well your heart's chambers and valves are working. You may receive an ultrasound enhancing agent through an IV if needed to better visualize your heart during the echo.This procedure takes approximately one hour. There are no restrictions for this procedure. This will take place at the [address], [address].     Follow-Up: At [facility], you and your health needs are our priority.  As part of our continuing mission to provide you with exceptional heart care, we have created designated Provider Care Teams.  These Care Teams include your primary Cardiologist (physician) and Advanced Practice Providers (APPs -  Physician Assistants and Nurse Practitioners) who all work together to provide you with the care you need, when you need it.  We recommend signing up for the patient portal called "MyChart".  Sign up information is provided on this After Visit Summary.  MyChart is used to connect with patients for Virtual Visits (Telemedicine).  Patients are able to view lab/test results, encounter notes, upcoming appointments, etc.  Non-urgent  messages can be sent to your provider as well.   To learn more about what you can do with MyChart, go to [email or web address].    Your next appointment:   Follow up in November in with Dr. Sallyanne Kuster after the ECHO A referral has been made to EP

## 2021-09-12 NOTE — Patient Instructions (Signed)
-  EAT GREENS TONIGHT -Then continue to 1 tablet daily.  -Recheck INR in 5 weeks, Coumadin Clinic [phone]

## 2021-09-13 LAB — DIGOXIN LEVEL: Digoxin, Serum: 0.9 ng/mL (ref 0.5–0.9)

## 2021-09-13 LAB — BASIC METABOLIC PANEL
BUN/Creatinine Ratio: 18 (ref 12–28)
BUN: 19 mg/dL (ref 8–27)
CO2: 26 mmol/L (ref 20–29)
Calcium: 9.6 mg/dL (ref 8.7–10.3)
Chloride: 101 mmol/L (ref 96–106)
Creatinine, Ser: 1.08 mg/dL — ABNORMAL HIGH (ref 0.57–1.00)
Glucose: 104 mg/dL — ABNORMAL HIGH (ref 70–99)
Potassium: 4.1 mmol/L (ref 3.5–5.2)
Sodium: 141 mmol/L (ref 134–144)
eGFR: 53 mL/min/{1.73_m2} — ABNORMAL LOW (ref >59)

## 2021-09-13 LAB — TSH: TSH: 2.91 u[IU]/mL (ref 0.450–4.500)

## 2021-09-14 ENCOUNTER — Other Ambulatory Visit: Payer: Self-pay | Admitting: *Deleted

## 2021-09-14 ENCOUNTER — Encounter: Payer: Self-pay | Admitting: Cardiovascular Disease

## 2021-09-14 MED ORDER — CARVEDILOL 3.125 MG PO TABS: 3.1250 mg | ORAL_TABLET | Freq: Two times a day (BID) | ORAL | 3 refills | Status: DC

## 2021-09-14 MED ORDER — DIGOXIN 125 MCG PO TABS: 0.1250 mg | ORAL_TABLET | ORAL | 3 refills | Status: DC

## 2021-09-15 NOTE — Telephone Encounter (Signed)
Thanks

## 2021-09-28 ENCOUNTER — Encounter: Payer: Self-pay | Admitting: Cardiovascular Disease

## 2021-09-29 ENCOUNTER — Other Ambulatory Visit: Payer: Self-pay | Admitting: *Deleted

## 2021-09-29 MED ORDER — POTASSIUM CHLORIDE CRYS ER 20 MEQ PO TBCR: EXTENDED_RELEASE_TABLET | ORAL | 1 refills | Status: DC

## 2021-10-17 ENCOUNTER — Ambulatory Visit (INDEPENDENT_AMBULATORY_CARE_PROVIDER_SITE_OTHER): Payer: Medicare Other | Admitting: *Deleted

## 2021-10-17 DIAGNOSIS — I480 Paroxysmal atrial fibrillation: Secondary | ICD-10-CM

## 2021-10-17 DIAGNOSIS — Z5181 Encounter for therapeutic drug level monitoring: Secondary | ICD-10-CM | POA: Diagnosis not present

## 2021-10-17 LAB — POCT INR: INR: 1.9 — AB (ref 2.0–3.0)

## 2021-10-17 NOTE — Patient Instructions (Addendum)
Description   Today take 1.5 tablets then continue taking Warfarin 1 tablet daily. Recheck INR in 5 weeks. Coumadin Clinic [phone]

## 2021-10-18 ENCOUNTER — Encounter: Payer: Self-pay | Admitting: Cardiology

## 2021-10-18 ENCOUNTER — Ambulatory Visit (INDEPENDENT_AMBULATORY_CARE_PROVIDER_SITE_OTHER): Payer: Medicare Other | Admitting: Cardiology

## 2021-10-18 VITALS — BP 110/68 | HR 75 | Ht 64.0 in | Wt 212.2 lb

## 2021-10-18 DIAGNOSIS — I4821 Permanent atrial fibrillation: Secondary | ICD-10-CM

## 2021-10-18 DIAGNOSIS — D6869 Other thrombophilia: Secondary | ICD-10-CM | POA: Diagnosis not present

## 2021-10-18 NOTE — Patient Instructions (Signed)
Medication Instructions:  Your physician recommends that you continue on your current medications as directed. Please refer to the Current Medication list given to you today.  *If you need a refill on your cardiac medications before your next appointment, please call your pharmacy*   Lab Work: None ordered   Testing/Procedures: None ordered   Follow-Up: At [facility], you and your health needs are our priority.  As part of our continuing mission to provide you with exceptional heart care, we have created designated Provider Care Teams.  These Care Teams include your primary Cardiologist (physician) and Advanced Practice Providers (APPs -  Physician Assistants and Nurse Practitioners) who all work together to provide you with the care you need, when you need it.  Your next appointment:   as  needed  The format for your next appointment:   In Person  Provider:   Will Camnitz, MD    Thank you for choosing [facility]!!   Kaidyn Javid, RN [phone]  Other Instructions   Important Information About Sugar           

## 2021-10-18 NOTE — Progress Notes (Signed)
Electrophysiology Office Note   Date:  10/18/2021   ID:  [name], DOB [birth year], MRN [id number]  PCP:  Glenis Smoker, MD  Cardiologist:  [place] Primary Electrophysiologist:  Genevieve Ritzel Meredith Leeds, MD    Chief Complaint: AF   History of Present Illness: [name] is a 76 y.o. female who is being seen today for the evaluation of AF at the request of Croitoru, Mihai, MD. Presenting today for electrophysiology evaluation.  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  She has been on both amiodarone and dofetilide and has failed to maintain sinus rhythm.  She did have a right heart catheterization that showed no evidence of volume overload from her mitral regurgitation and thus it was thought that MitraClip was not indicated.  Today, she denies symptoms of palpitations, chest pain, orthopnea, PND, lower extremity edema, claudication, dizziness, presyncope, syncope, bleeding, or neurologic sequela. The patient is tolerating medications without difficulties.  Her main symptoms are weakness, fatigue, shortness of breath.  She find it difficult to do all of her daily activities due to her level of fatigue.  She is unfortunately been in atrial fibrillation for the last few years.  She is quite frustrated with how she has been feeling.   Past Medical History:  Diagnosis Date   Anxiety    Aortic stenosis    status post aortic valve replacement with St. 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   Depression    DJD (degenerative joint disease) of knee    Dyslipidemia    GERD (gastroesophageal reflux disease)    "several years ago; none since" (11/12/2012)   Hyperlipidemia    Hyperlipidemia LDL goal <70 01/19/2017   Hypertension    Left atrial enlargement    Mitral regurgitation 06/25/2016   Mild moderate MR by echo 06/2016   Obesity    Persistent atrial fibrillation (HCC)    [facility]    On CPAP at 12cm H2O   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 Jude AVR   CARDIOVERSION N/A 07/22/2012   Procedure: CARDIOVERSION;  Surgeon: MaCandee FurbishMD;  [facility];  Service: Cardiovascular;  Laterality: N/A;   CARDIOVERSION N/A 11/25/2012   Procedure: CARDIOVERSION;  Surgeon: TrSueanne MargaritaMD;  [facility];  Service: Cardiovascular;  Laterality: N/A;   CARDIOVERSION N/A 12/30/2012   Procedure: CARDIOVERSION;  Surgeon: TrSueanne MargaritaMD;  [facility];  Service: Cardiovascular;  Laterality: N/A;  h&p in file-HW    CARDIOVERSION N/A 03/30/2013   Procedure: CARDIOVERSION;  Surgeon: TrSueanne MargaritaMD;  [facility];  Service: Cardiovascular;  Laterality: N/A;   ELECTROPHYSIOLOGIC STUDY  11/2013   Afib ablation x 2 (11/2013  and 10/2015) at [facility] by Dr Clyda Hurdle with recurrence post ablation   KNEE ARTHROSCOPY Left 1980's   "2" (11/12/2012)   KNEE SURGERY Left 1980's   "after 2 scopes they went in and did some kind of OR" (11/12/2012)   RIGHT/LEFT HEART CATH AND CORONARY ANGIOGRAPHY N/A 10/14/2020   Procedure: RIGHT/LEFT HEART CATH AND CORONARY ANGIOGRAPHY;  Surgeon: Martinique, Peter M, MD;  Location: [place] [facility] LAB;  Service: Cardiovascular;  Laterality: N/A;   TEE WITHOUT  CARDIOVERSION N/A 07/22/2012   Procedure: TRANSESOPHAGEAL ECHOCARDIOGRAM (TEE);  Surgeon: [name], MD;  Location[facility]COPY;  Service: Cardiovascular;  Laterality: N/A;  Rm 2034   TEE WITHOUT CARDIOVERSION N/A 10/07/2013   Procedure: TRANSESOPHAGEAL ECHOCARDIOGRAM (TEE);  Surgeon: [name], MD;  [facility];  Service: Cardiovascular;  Laterality: N/A;   TEE WITHOUT CARDIOVERSION N/A 07/12/2020   Procedure: TRANSESOPHAGEAL ECHOCARDIOGRAM (TEE);  Surgeon: Sanda Klein, MD;  Location: [place];  Service: Cardiovascular;  Laterality: N/A;   TOTAL KNEE ARTHROPLASTY Left 11/05/2014   Procedure: TOTAL KNEE ARTHROPLASTY;  Surgeon: Dorna Leitz, MD;  Location: [place];  Service: Orthopedics;  Laterality: Left;   TUBAL LIGATION  1980's     Current Outpatient Medications  Medication Sig Dispense Refill   acetaminophen (TYLENOL) 500 MG tablet Take 1,000 mg by mouth every 6 (six) hours as needed (pain).     calcium-vitamin D (OSCAL WITH D) 500-200 MG-UNIT per tablet Take 2 tablets by mouth in the morning.     carvedilol (COREG) 3.125 MG tablet Take 1 tablet (3.125 mg total) by mouth 2 (two) times daily with a meal. 180 tablet 3   Cholecalciferol (VITAMIN D) 2000 UNITS tablet Take 4,000 Units by mouth in the morning.     digoxin (LANOXIN) 0.125 MG tablet Take 1 tablet (0.125 mg total) by mouth every other day. 45 tablet 3   escitalopram (LEXAPRO) 10 MG tablet Take 1 tablet (10 mg total) by mouth daily. (Patient taking differently: Take 20 mg by mouth daily.) 90 tablet 0   famotidine (PEPCID) 10 MG tablet Take 10 mg by mouth as needed for heartburn or indigestion.     ferrous sulfate 325 (65 FE) MG tablet Take 325 mg by mouth in the morning.     furosemide (LASIX) 80 MG tablet Take 1 tablet by mouth daily. OKAY TO TAKE EXTRA FOR WEIGHT GAIN> 3LB/ DAILY OR 5LB/WEEKLY 90 tablet 3   levothyroxine (SYNTHROID, LEVOTHROID) 100 MCG tablet Take 100 mcg daily before breakfast by mouth.      Multiple Vitamin (MULTIVITAMIN WITH MINERALS) TABS Take 1 tablet by mouth in the morning.     oxymetazoline (AFRIN) 0.05 % nasal spray Place 1 spray into both nostrils daily as needed for congestion.     potassium chloride SA (KLOR-CON M) 20 MEQ tablet TAKE 2 TABLETS(40 MEQ) BY MOUTH DAILY 180 tablet 1   sacubitril-valsartan (ENTRESTO) 97-103 MG TAKE 1 TABLET BY MOUTH TWICE DAILY 180 tablet 1   traZODone (DESYREL) 50 MG tablet Take 50-100 mg by mouth at bedtime as needed.     tretinoin (RETIN-A) 0.05 % cream Apply 1 application topically at bedtime. Applied to face     warfarin (COUMADIN) 4 MG tablet TAKE 1 TO 2 TABLETS BY MOUTH DAILY AS DIRECTED BY COUMADIN CLINIC 150 tablet 0   rosuvastatin (CRESTOR) 20 MG tablet Take 1 tablet (20 mg total) by mouth daily. 90 tablet 3   No current facility-administered medications for this visit.    Allergies:  Codeine, Dilantin [phenytoin sodium extended], Metoprolol, and Propoxyphene   Social History:  The patient  reports that she has never smoked. She has never used smokeless tobacco. She reports that she does not drink alcohol and does not use drugs.   Family History:  The patient's family history includes Heart disease in her brother; Hyperlipidemia in her father; Hypertension in her father and mother; Lung cancer in her mother.    ROS:  Please see the history of present illness.   Otherwise, review of systems is positive for none.   All other systems are reviewed and negative.    PHYSICAL EXAM: VS:  BP 110/68   Pulse 75   Ht '5\' 4"'$  (1.626 m)   Wt 212 lb 3.2 oz (96.3 kg)   SpO2 96%   BMI 36.42 kg/m  , BMI Body mass index is 36.42 kg/m. GEN: Well nourished, well developed, in no acute distress  HEENT: normal  Neck: no JVD, carotid bruits, or masses Cardiac: irregular; no murmurs, rubs, or gallops,no edema  Respiratory:  clear to auscultation bilaterally, normal work of breathing GI: soft, nontender, nondistended, + BS MS: no deformity or  atrophy  Skin: warm and dry Neuro:  Strength and sensation are intact Psych: euthymic mood, full affect  EKG:  EKG is ordered today. Personal review of the ekg ordered shows atrial fibrillation, right bundle branch block   Recent Labs: 04/12/2021: Hemoglobin 12.6; Platelets 192 09/12/2021: BUN 19; Creatinine, Ser 1.08; Potassium 4.1; Sodium 141; TSH 2.910    Lipid Panel     Component Value Date/Time   CHOL 170 05/24/2021 1054   TRIG 94 05/24/2021 1054   HDL 64 05/24/2021 1054   CHOLHDL 2.7 05/24/2021 1054   LDLCALC 89 05/24/2021 1054     Wt Readings from Last 3 Encounters:  10/18/21 212 lb 3.2 oz (96.3 kg)  09/12/21 215 lb (97.5 kg)  06/22/21 217 lb 9.6 oz (98.7 kg)      Other studies Reviewed: Additional studies/ records that were reviewed today include: TTE 02/07/21  Review of the above records today demonstrates:   1. Left ventricular ejection fraction, by estimation, is 55%. The left  ventricle has normal function. The left ventricle has no regional wall  motion abnormalities. Left ventricular diastolic parameters are consistent  with Grade II diastolic dysfunction  (pseudonormalization).   2. Mildly D-shaped septum consistent with RV pressure/volume overload.  Right ventricular systolic function is moderately reduced. The right  ventricular size is mildly enlarged. There is normal pulmonary artery  systolic pressure. The estimated right  ventricular systolic pressure is 29.5 mmHg.   3. Left atrial size was moderately dilated.   4. Right atrial size was severely dilated.   5. The mitral valve is abnormal with thickening and calcification, the  posterior leaflet is restricted. Possible rheumatic valve disease. There  is moderate to severe mitral valve regurgitation, PISA does not appear  accurate. No evidence of mitral  stenosis. Moderate mitral annular calcification.   6. St Jude mechanical aortic valve. Trivial regurgitation. Mean gradient  10 mmHg. Normal  function.   7. Aortic dilatation noted. There is moderate dilatation of the ascending  aorta, measuring 45 mm.   8. The inferior vena cava is normal in size with greater than 50%  respiratory variability, suggesting right atrial pressure of 3 mmHg.    ASSESSMENT AND PLAN:  1.  Permanent atrial fibrillation: Currently on warfarin for a history of mechanical aortic valve.  She feels quite poorly and her  atrial fibrillation.  Unfortunately due to the multiple years that she has been in atrial fibrillation, mitral regurgitation, valvular heart disease and her aortic valve, I do not feel that she is a good candidate for rhythm control.  She has failed both amiodarone and dofetilide.  I do not feel that repeat ablation would be indicated. I have discussed this with her primary cardiologist.  2.  Chronic systolic heart failure: Ejection fraction has improved.  Plan per primary cardiology.  3.  Mitral regurgitation: Moderate to severe on TTE.  No evidence of volume overload on right heart catheterization.  Plan per primary cardiology.  4.  Secondary hypercoagulable state: Currently on warfarin for aortic valve replacement and atrial fibrillation  Current medicines are reviewed at length with the patient today.   The patient does not have concerns regarding her medicines.  The following changes were made today:  none  Labs/ tests ordered today include:  Orders Placed This Encounter  Procedures   EKG 12-Lead     Disposition:   FU with Elvis Boot as needed d  Signed, Amaziah Ghosh Meredith Leeds, MD  10/18/2021 10:58 AM     [address] [place] [id number] [phone] (office) [phone] (fax)

## 2021-11-06 ENCOUNTER — Other Ambulatory Visit: Payer: Self-pay | Admitting: Cardiovascular Disease

## 2021-11-21 ENCOUNTER — Ambulatory Visit (INDEPENDENT_AMBULATORY_CARE_PROVIDER_SITE_OTHER): Payer: Medicare Other | Admitting: *Deleted

## 2021-11-21 DIAGNOSIS — I480 Paroxysmal atrial fibrillation: Secondary | ICD-10-CM

## 2021-11-21 DIAGNOSIS — Z5181 Encounter for therapeutic drug level monitoring: Secondary | ICD-10-CM | POA: Diagnosis not present

## 2021-11-21 LAB — POCT INR: INR: 2.4 (ref 2.0–3.0)

## 2021-11-21 NOTE — Patient Instructions (Signed)
Description   Continue taking Warfarin 1 tablet daily. Recheck INR in 5 weeks.  Coumadin Clinic [phone]     

## 2021-12-01 ENCOUNTER — Ambulatory Visit (INDEPENDENT_AMBULATORY_CARE_PROVIDER_SITE_OTHER): Payer: Medicare Other | Admitting: Adult Health

## 2021-12-01 ENCOUNTER — Encounter: Payer: Self-pay | Admitting: Adult Health

## 2021-12-01 VITALS — BP 118/61 | HR 73 | Ht 65.0 in | Wt 213.0 lb

## 2021-12-01 DIAGNOSIS — F331 Major depressive disorder, recurrent, moderate: Secondary | ICD-10-CM

## 2021-12-01 MED ORDER — DESVENLAFAXINE SUCCINATE ER 50 MG PO TB24: 50.0000 mg | ORAL_TABLET | Freq: Every day | ORAL | 2 refills | Status: DC

## 2021-12-01 NOTE — Progress Notes (Signed)
[facility] MD/PA/NP Initial Note  12/01/2021 2:13 PM [name]  MRN:  [id number]  Chief Complaint:   HPI:  Patient seen today for initial psychiatric evaluation.   Referred by PCP for depression.   Describes mood today as "not the best". Pleasant. Tearful. Mood symptoms - reports depression - feels "sad and in despair". Stating "I don't feel any joy". Denies anxiety and irritability. Denies worry and rumination - "nothing really to worry about". Mood is consistently down. Feels like symptoms started after her knee surgery 7 years ago - complications. Stating "I don't really have anything to be depressed about, I just am". Started on Zoloft several years ago after losing family members and felt like it was helpful for a while, but eventually was not helpful. Has also taken Wellbutrin and had a reaction to it. Currently taking Lexapro at '20mg'$  daily and does not feel like it is helpful. Willing to consider other options. Decreased interest and motivation. Taking medications as prescribed.  Energy levels stable. Active, has a regular exercise routine - treadmill.  Enjoys some usual interests and activities. Married. Lives with husband and 2 [place] terriers. Has a son, age 25. Has one step daughter - 60. Spending time with family. Attends church. Ladies group - card game. Likes to sew - has a sewing room. Appetite adequate. Weight stable. Sleeps well most nights. Averages 7 to 8.5 hours. Sometimes sleeping during the day - "I think to escape" Focus and concentration stable - "pretty good". Completing tasks. Managing aspects of household. Retired - [employer]. Denies SI or HI.  Denies AH or VH. Denies self harm. Denies substance use.  Previous medication trials:  Xanax, Zoloft, Wellbutrin - some adverse reaction, Valium, Ambien  Visit Diagnosis:    ICD-10-CM   1. Major depressive disorder, recurrent episode, moderate (HCC)  F33.1 desvenlafaxine (PRISTIQ) 50 MG 24 hr tablet       Past Psychiatric History: Denies psychiatric hospitalization.   Past Medical History:  Past Medical History:  Diagnosis Date   Anxiety    Aortic stenosis    status post aortic valve replacement with St. 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   Depression    DJD (degenerative joint disease) of knee    Dyslipidemia    GERD (gastroesophageal reflux disease)    "several years ago; none since" (11/12/2012)   Hyperlipidemia    Hyperlipidemia LDL goal <70 01/19/2017   Hypertension    Left atrial enlargement    Mitral regurgitation 06/25/2016   Mild moderate MR by echo 06/2016   Obesity    Persistent atrial fibrillation (HCC)    [facility]    On CPAP at 12cm H2O   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 Jude AVR   CARDIOVERSION N/A 07/22/2012   Procedure: CARDIOVERSION;  Surgeon: MaCandee FurbishMD;  [facility];  Service: Cardiovascular;  Laterality: N/A;   CARDIOVERSION N/A 11/25/2012   Procedure: CARDIOVERSION;  Surgeon: TrSueanne MargaritaMD;  Location: [place];  Service: Cardiovascular;  Laterality: N/A;   CARDIOVERSION N/A 12/30/2012   Procedure: CARDIOVERSION;  Surgeon: [name], MD;  Location: [place];  Service: Cardiovascular;  Laterality: N/A;  h&p in file-HW    CARDIOVERSION N/A 03/30/2013   Procedure: CARDIOVERSION;  Surgeon: [name], MD;  Location: [place];  Service: Cardiovascular;  Laterality: N/A;   ELECTROPHYSIOLOGIC STUDY  11/2013   Afib ablation x 2 (11/2013 and 10/2015) at [facility] by Dr Clyda Hurdle with  recurrence post ablation   KNEE ARTHROSCOPY Left 1980's   "2" (11/12/2012)   KNEE SURGERY Left 1980's   "after 2 scopes they went in and did some kind of OR" (11/12/2012)   RIGHT/LEFT HEART CATH AND CORONARY ANGIOGRAPHY N/A 10/14/2020   Procedure: RIGHT/LEFT HEART CATH AND CORONARY ANGIOGRAPHY;  Surgeon: Martinique, Peter M, MD;  Location: [place] [facility];  Service: Cardiovascular;  Laterality: N/A;   TEE WITHOUT CARDIOVERSION N/A 07/22/2012   Procedure: TRANSESOPHAGEAL ECHOCARDIOGRAM (TEE);  Surgeon: [name], MD;  Location: [facility] ENDOSCOPY;  Service: Cardiovascular;  Laterality: N/A;  Rm 2034   TEE WITHOUT CARDIOVERSION N/A 10/07/2013   Procedure: TRANSESOPHAGEAL ECHOCARDIOGRAM (TEE);  Surgeon: [name], MD;  [facility];  Service: Cardiovascular;  Laterality: N/A;   TEE WITHOUT CARDIOVERSION N/A 07/12/2020   Procedure: TRANSESOPHAGEAL ECHOCARDIOGRAM (TEE);  Surgeon: Sanda Klein, MD;  Location: [place];  Service: Cardiovascular;  Laterality: N/A;   TOTAL KNEE ARTHROPLASTY Left 11/05/2014   Procedure: TOTAL KNEE ARTHROPLASTY;  Surgeon: Dorna Leitz, MD;  Location: [place];  Service: Orthopedics;  Laterality: Left;   TUBAL LIGATION  1980's    Family Psychiatric History: Denies any family history of mental illness.   Family History:  Family History  Problem Relation Age of Onset   Lung cancer Mother    Hypertension Mother    Hyperlipidemia Father    Hypertension Father    Heart disease Brother     Social History:  Social History   Socioeconomic History   Marital status: Married    Spouse name: Not on file   Number of children: Not on file   Years of education: Not on file   Highest education level: Not on file  Occupational History   Not on file  Tobacco Use   Smoking status: Never   Smokeless tobacco: Never   Tobacco comments:    Never smoke 06/22/21  Substance and Sexual Activity   Alcohol use: No    Comment: 11/12/2012 "no alcohol in the last 9 years"   Drug  use: No   Sexual activity: Not Currently  Other Topics Concern   Not on file  Social History Narrative   Pt lives in [place] with spouse.  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 [employer] Insecurity: Not on file  Transportation Needs: Not on file  Physical Activity: Inactive (09/30/2019)   Exercise Vital Sign    Days of Exercise per Week: 0 days    Minutes of Exercise per Session: 0 min  Stress: Not on file  Social Connections: Unknown (09/30/2019)   Social Connection and Isolation Panel [NHANES]    Frequency of Communication with Friends and Family: Once a week    Frequency of Social Gatherings with Friends and Family: Not on file    Attends Religious Services: 1 to 4 times per year    Active Member of [employer] or Organizations: No    Attends Archivist Meetings: Never  Marital Status: Married    Allergies:  Allergies  Allergen Reactions   Codeine Anaphylaxis    Jerking, involuntary jerking    Dilantin [Phenytoin Sodium Extended] Rash and Itching   Metoprolol Other (See Comments)    depression    Propoxyphene Other (See Comments)    Bad vivid dreams    Metabolic Disorder Labs: No results found for: "HGBA1C", "MPG" No results found for: "PROLACTIN" Lab Results  Component Value Date   CHOL 170 05/24/2021   TRIG 94 05/24/2021   HDL 64 05/24/2021   CHOLHDL 2.7 05/24/2021   LDLCALC 89 05/24/2021   LDLCALC 84 05/27/2018   Lab Results  Component Value Date   TSH 2.910 09/12/2021   TSH 4.836 (H) 08/15/2020    Therapeutic Level Labs: No results found for: "LITHIUM" No results found for: "VALPROATE" No results found for: "CBMZ"  Current Medications: Current Outpatient Medications  Medication Sig Dispense Refill   desvenlafaxine (PRISTIQ) 50 MG 24 hr tablet Take 1 tablet (50 mg total) by mouth daily. 30 tablet 2   acetaminophen (TYLENOL) 500 MG tablet Take 1,000 mg by mouth every 6 (six) hours as needed (pain).      calcium-vitamin D (OSCAL WITH D) 500-200 MG-UNIT per tablet Take 2 tablets by mouth in the morning.     carvedilol (COREG) 3.125 MG tablet Take 1 tablet (3.125 mg total) by mouth 2 (two) times daily with a meal. 180 tablet 3   Cholecalciferol (VITAMIN D) 2000 UNITS tablet Take 4,000 Units by mouth in the morning.     digoxin (LANOXIN) 0.125 MG tablet Take 1 tablet (0.125 mg total) by mouth every other day. 45 tablet 3   famotidine (PEPCID) 10 MG tablet Take 10 mg by mouth as needed for heartburn or indigestion.     ferrous sulfate 325 (65 FE) MG tablet Take 325 mg by mouth in the morning.     furosemide (LASIX) 80 MG tablet Take 1 tablet by mouth daily. OKAY TO TAKE EXTRA FOR WEIGHT GAIN> 3LB/ DAILY OR 5LB/WEEKLY 90 tablet 3   levothyroxine (SYNTHROID, LEVOTHROID) 100 MCG tablet Take 100 mcg daily before breakfast by mouth.     Multiple Vitamin (MULTIVITAMIN WITH MINERALS) TABS Take 1 tablet by mouth in the morning.     oxymetazoline (AFRIN) 0.05 % nasal spray Place 1 spray into both nostrils daily as needed for congestion.     potassium chloride SA (KLOR-CON M) 20 MEQ tablet TAKE 2 TABLETS(40 MEQ) BY MOUTH DAILY 180 tablet 1   rosuvastatin (CRESTOR) 20 MG tablet Take 1 tablet (20 mg total) by mouth daily. 90 tablet 3   sacubitril-valsartan (ENTRESTO) 97-103 MG TAKE 1 TABLET BY MOUTH TWICE DAILY 180 tablet 1   traZODone (DESYREL) 50 MG tablet Take 50-100 mg by mouth at bedtime as needed.     tretinoin (RETIN-A) 0.05 % cream Apply 1 application topically at bedtime. Applied to face     warfarin (COUMADIN) 4 MG tablet TAKE 1 TO 2 TABLETS BY MOUTH DAILY AS DIRECTED BY COUMADIN CLINIC 150 tablet 0   No current facility-administered medications for this visit.    Medication Side Effects: none  Orders placed this visit:  No orders of the defined types were placed in this encounter.   Psychiatric Specialty Exam:  Review of Systems  Musculoskeletal:  Negative for gait problem.   Neurological:  Negative for tremors.  Psychiatric/Behavioral:         Please refer to HPI    Blood pressure 118/61,  pulse 73, height '5\' 5"'$  (1.651 m), weight 213 lb (96.6 kg).Body mass index is 35.45 kg/m.  General Appearance: Casual and Neat  Eye Contact:  Good  Speech:  Clear and Coherent and Normal Rate  Volume:  Normal  Mood:  Depressed  Affect:  Appropriate and Congruent  Thought Process:  Coherent and Descriptions of Associations: Intact  Orientation:  Full (Time, Place, and Person)  Thought Content: Logical   Suicidal Thoughts:  No  Homicidal Thoughts:  No  Memory:  WNL  Judgement:  Good  Insight:  Good  Psychomotor Activity:  Normal  Concentration:  Concentration: Good and Attention Span: Good  Recall:  Good  Fund of Knowledge: Good  Language: Good  Assets:  Communication Skills Desire for Improvement Financial Resources/Insurance Housing Intimacy Leisure Time Physical Health Resilience Social Support Talents/Skills Transportation Vocational/Educational  ADL's:  Intact  Cognition: WNL  Prognosis:  Good   Screenings:  Flowsheet Row ED from 04/13/2021 in [place] Admission (Discharged) from 10/14/2020 in [place] CATH LAB ED to Hosp-Admission (Discharged) from 08/14/2020 in [place] HF PCU  C-SSRS RISK CATEGORY No Risk No Risk No Risk       Receiving Psychotherapy: No   Treatment Plan/Recommendations:   Plan:  PDMP reviewed  D/C Lexapro '20mg'$  daily - 1/2 tab x 5 days, then d/c. Continue Trazadone '50mg'$  for sleep Add Pristiq '50mg'$  daily   Consider low dose Rexulti  Check Vitamin D level  Time spent with patient was 60 minutes. Greater than 50% of face to face time with patient was spent on counseling and coordination of care.    RTC 4 weeks  Patient advised to contact office with any questions, adverse effects, or acute worsening in signs and symptoms.       Aloha Gell, NP

## 2021-12-04 ENCOUNTER — Telehealth: Payer: Self-pay | Admitting: Adult Health

## 2021-12-04 NOTE — Telephone Encounter (Signed)
Prior authorization submitted for DESVENLAFAXINE SUCCINAE ER 50 MG with [facility] Medicare, pending response

## 2021-12-04 NOTE — Telephone Encounter (Signed)
Noted thank you

## 2021-12-04 NOTE — Telephone Encounter (Signed)
Pristiq (generic) will need a Prior Auth for her to fill it.

## 2021-12-06 NOTE — Telephone Encounter (Signed)
Prior Approval received effective 11/04/2021, approved until further notice Desvenlafaxine Succinate ER 50 mg tablet with [employer].

## 2021-12-12 ENCOUNTER — Encounter: Payer: Self-pay | Admitting: Cardiovascular Disease

## 2021-12-26 ENCOUNTER — Ambulatory Visit: Payer: Medicare Other | Attending: Cardiology | Admitting: *Deleted

## 2021-12-26 DIAGNOSIS — I480 Paroxysmal atrial fibrillation: Secondary | ICD-10-CM

## 2021-12-26 DIAGNOSIS — Z5181 Encounter for therapeutic drug level monitoring: Secondary | ICD-10-CM | POA: Diagnosis not present

## 2021-12-26 LAB — POCT INR: INR: 2.4 (ref 2.0–3.0)

## 2021-12-26 NOTE — Patient Instructions (Signed)
Description   Continue taking Warfarin 1 tablet daily.  Recheck INR in 6 weeks.  Coumadin Clinic [phone]      

## 2021-12-27 ENCOUNTER — Other Ambulatory Visit: Payer: Self-pay | Admitting: Cardiovascular Disease

## 2021-12-27 DIAGNOSIS — I480 Paroxysmal atrial fibrillation: Secondary | ICD-10-CM

## 2021-12-29 ENCOUNTER — Encounter: Payer: Self-pay | Admitting: Adult Health

## 2021-12-29 ENCOUNTER — Ambulatory Visit (INDEPENDENT_AMBULATORY_CARE_PROVIDER_SITE_OTHER): Payer: Medicare Other | Admitting: Adult Health

## 2021-12-29 DIAGNOSIS — F331 Major depressive disorder, recurrent, moderate: Secondary | ICD-10-CM | POA: Diagnosis not present

## 2021-12-29 MED ORDER — DESVENLAFAXINE SUCCINATE ER 50 MG PO TB24: 50.0000 mg | ORAL_TABLET | Freq: Every day | ORAL | 2 refills | Status: DC

## 2021-12-29 NOTE — Progress Notes (Signed)
[name] [id number] [birth year] 76 y.o.  Subjective:   Patient ID:  [name] is a 76 y.o. (DOB Apr 15, 1945) female.  Chief Complaint: No chief complaint on file.   HPI [name] presents to the office today for follow-up of MDD.  Describes mood today as "better". Pleasant. Decreased tearfulness. Mood symptoms - reports decreased depression. Denies anxiety and irritability. Denies worry and rumination. Mood has improved. Stating "I'm doing better". Feels like the addition of Pristiq has been helpful. Improved interest and motivation. Taking medications as prescribed.  Energy levels stable. Active, has a regular exercise routine - treadmill.  Enjoys some usual interests and activities. Married. Lives with husband and 2 [place] terriers. Has a son, age 31. Has one step daughter - 68. Spending time with family. Attends church.  Appetite adequate. Weight stable. Sleeps well most nights. Averages 7 to 8.5 hours. Decreased napping. Focus and concentration stable. Completing tasks. Managing aspects of household. Retired - [employer]. Denies SI or HI.  Denies AH or VH. Denies self harm. Denies substance use.  Previous medication trials:  Xanax, Zoloft, Wellbutrin - some adverse reaction, Valium, Ambien, Lexapro  Flowsheet Row ED from 04/13/2021 in [place] Admission (Discharged) from 10/14/2020 in [place] CATH LAB ED to Hosp-Admission (Discharged) from 08/14/2020 in [place] HF PCU  C-SSRS RISK CATEGORY No Risk No Risk No Risk        Review of Systems:  Review of Systems  Musculoskeletal:  Negative for gait problem.  Neurological:  Negative for tremors.  Psychiatric/Behavioral:         Please refer to HPI    Medications: I have reviewed the patient's current medications.  Current Outpatient Medications  Medication Sig Dispense Refill   acetaminophen (TYLENOL) 500 MG  tablet Take 1,000 mg by mouth every 6 (six) hours as needed (pain).     calcium-vitamin D (OSCAL WITH D) 500-200 MG-UNIT per tablet Take 2 tablets by mouth in the morning.     carvedilol (COREG) 3.125 MG tablet Take 1 tablet (3.125 mg total) by mouth 2 (two) times daily with a meal. 180 tablet 3   Cholecalciferol (VITAMIN D) 2000 UNITS tablet Take 4,000 Units by mouth in the morning.     desvenlafaxine (PRISTIQ) 50 MG 24 hr tablet Take 1 tablet (50 mg total) by mouth daily. 30 tablet 2   digoxin (LANOXIN) 0.125 MG tablet Take 1 tablet (0.125 mg total) by mouth every other day. 45 tablet 3   famotidine (PEPCID) 10 MG tablet Take 10 mg by mouth as needed for heartburn or indigestion.     ferrous sulfate 325 (65 FE) MG tablet Take 325 mg by mouth in the morning.     furosemide (LASIX) 80 MG tablet Take 1 tablet by mouth daily. OKAY TO TAKE EXTRA FOR WEIGHT GAIN> 3LB/ DAILY OR 5LB/WEEKLY 90 tablet 3   levothyroxine (SYNTHROID, LEVOTHROID) 100 MCG tablet Take 100 mcg daily before breakfast by mouth.     Multiple Vitamin (MULTIVITAMIN WITH MINERALS) TABS Take 1 tablet by mouth in the morning.     oxymetazoline (AFRIN) 0.05 % nasal spray Place 1 spray into both nostrils daily as needed for congestion.     potassium chloride SA (KLOR-CON M) 20 MEQ tablet TAKE 2 TABLETS(40 MEQ) BY MOUTH DAILY 180 tablet 1   rosuvastatin (CRESTOR) 20 MG tablet Take 1 tablet (20 mg total) by mouth daily. 90 tablet 3   sacubitril-valsartan (  ENTRESTO) 97-103 MG TAKE 1 TABLET BY MOUTH TWICE DAILY 180 tablet 1   traZODone (DESYREL) 50 MG tablet Take 50-100 mg by mouth at bedtime as needed.     tretinoin (RETIN-A) 0.05 % cream Apply 1 application topically at bedtime. Applied to face     warfarin (COUMADIN) 4 MG tablet TAKE 1 TABLET BY MOUTH DAILY AS DIRECTED BY COUMADIN CLINIC 100 tablet 1   No current facility-administered medications for this visit.    Medication Side Effects: None  Allergies:  Allergies  Allergen  Reactions   Codeine Anaphylaxis    Jerking, involuntary jerking    Dilantin [Phenytoin Sodium Extended] Rash and Itching   Metoprolol Other (See Comments)    depression    Propoxyphene Other (See Comments)    Bad vivid dreams    Past Medical History:  Diagnosis Date   Anxiety    Aortic stenosis    status post aortic valve replacement with St. 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   Depression    DJD (degenerative joint disease) of knee    Dyslipidemia    GERD (gastroesophageal reflux disease)    "several years ago; none since" (11/12/2012)   Hyperlipidemia    Hyperlipidemia LDL goal <70 01/19/2017   Hypertension    Left atrial enlargement    Mitral regurgitation 06/25/2016   Mild moderate MR by echo 06/2016   Obesity    Persistent atrial fibrillation (HCC)    [facility]    On CPAP at 12cm H2O   [place]   in setting of INR greater than 2.2 shortly after AVR - now cleared by neurosurgery to maintain INR 2-2.5    Past Medical History, Surgical history, Social history, and Family history were reviewed and updated as appropriate.   Please see review of systems for further details on the patient's review from today.   Objective:   Physical Exam:  There were no vitals taken for this visit.  Physical Exam Constitutional:      General: She is not in acute distress. Musculoskeletal:        General: No deformity.  Neurological:     Mental Status: She is alert and oriented to person, place, and time.     Coordination: Coordination normal.  Psychiatric:        Attention and Perception: Attention and perception normal. She does not perceive auditory or visual hallucinations.        Mood and Affect: Mood normal. Mood is not anxious or depressed.  Affect is not labile, blunt, angry or inappropriate.        Speech: Speech normal.        Behavior: Behavior normal.        Thought Content: Thought content normal. Thought content is not paranoid or delusional. Thought content does not include homicidal or suicidal ideation. Thought content does not include homicidal or suicidal plan.        Cognition and Memory: Cognition and memory normal.        Judgment: Judgment normal.     Comments: Insight intact     Lab Review:     Component Value Date/Time   NA 141 09/12/2021 1022   K 4.1 09/12/2021 1022   CL  101 09/12/2021 1022   CO2 26 09/12/2021 1022   GLUCOSE 104 (H) 09/12/2021 1022   GLUCOSE 91 04/12/2021 1816   BUN 19 09/12/2021 1022   CREATININE 1.08 (H) 09/12/2021 1022   CREATININE 0.82 12/28/2015 1000   CALCIUM 9.6 09/12/2021 1022   PROT 7.4 08/14/2020 1730   PROT 7.1 05/27/2018 1125   ALBUMIN 3.8 08/14/2020 1730   ALBUMIN 4.5 05/27/2018 1125   AST 26 08/14/2020 1730   ALT 19 08/14/2020 1730   ALKPHOS 63 08/14/2020 1730   BILITOT 0.9 08/14/2020 1730   BILITOT 0.4 05/27/2018 1125   GFRNONAA >60 04/12/2021 1816   GFRAA 82 02/05/2020 1120       Component Value Date/Time   WBC 4.9 04/12/2021 1816   RBC 4.00 04/12/2021 1816   HGB 12.6 04/12/2021 1816   HGB 11.7 09/28/2020 1017   HCT 37.3 04/12/2021 1816   HCT 34.6 09/28/2020 1017   PLT 192 04/12/2021 1816   PLT 207 09/28/2020 1017   MCV 93.3 04/12/2021 1816   MCV 92 09/28/2020 1017   MCH 31.5 04/12/2021 1816   MCHC 33.8 04/12/2021 1816   RDW 12.8 04/12/2021 1816   RDW 12.6 09/28/2020 1017   LYMPHSABS 0.9 09/28/2020 1017   MONOABS 0.6 08/18/2020 0338   EOSABS 0.2 09/28/2020 1017   BASOSABS 0.0 09/28/2020 1017    No results found for: "POCLITH", "LITHIUM"   No results found for: "PHENYTOIN", "PHENOBARB", "VALPROATE", "CBMZ"   .res Assessment: Plan:    Plan:  PDMP reviewed  Trazadone '50mg'$  for sleep Pristiq '50mg'$  daily - will return in 6 weeks to  determine if increase needed  Consider low dose Rexulti  Check Vitamin D level  Time spent with patient was 20 minutes. Greater than 50% of face to face time with patient was spent on counseling and coordination of care.    RTC 4 weeks  Patient advised to contact office with any questions, adverse effects, or acute worsening in signs and symptoms. Diagnoses and all orders for this visit:  Major depressive disorder, recurrent episode, moderate (HCC) -     desvenlafaxine (PRISTIQ) 50 MG 24 hr tablet; Take 1 tablet (50 mg total) by mouth daily.     Please see After Visit Summary for patient specific instructions.  Future Appointments  Date Time Provider [place]  02/09/2022 11:40 AM Tayvion Lauder, Berdie Ogren, NP [facility] None  02/13/2022 10:15 AM [facility] E[facility] 3 [id number]  02/15/2022 10:00 AM Croitoru, Mihai, MD [facility] None  02/15/2022 11:15 AM [facility] None    No orders of the defined types were placed in this encounter.   -------------------------------

## 2022-01-22 ENCOUNTER — Telehealth: Payer: Self-pay | Admitting: Adult Health

## 2022-01-22 NOTE — Telephone Encounter (Signed)
Patient lvm stating that her Pristiq '50mg'$  "seems to have stop working as of 10/2. She feels as bad as she always does and its getting worse everyday. Would like to know what she should do. Ph: [phone] Appt 11/3

## 2022-01-23 NOTE — Telephone Encounter (Signed)
Notified patient. She will call on Friday and let us know how she is doing.

## 2022-01-23 NOTE — Telephone Encounter (Signed)
Patient feels like Pristiq 50 mg is no longer working for her. She is depressed all day (8/10), has no motivation. She sleeps fine. No new stressors. She has  F/U 11/3.  In note from September mentions discussing increase at F/U. Ok to increase now ?

## 2022-01-23 NOTE — Telephone Encounter (Signed)
Yes

## 2022-01-25 ENCOUNTER — Other Ambulatory Visit: Payer: Self-pay

## 2022-01-25 DIAGNOSIS — F331 Major depressive disorder, recurrent, moderate: Secondary | ICD-10-CM

## 2022-01-25 MED ORDER — DESVENLAFAXINE SUCCINATE ER 50 MG PO TB24: 50.0000 mg | ORAL_TABLET | Freq: Every day | ORAL | 0 refills | Status: DC

## 2022-01-25 MED ORDER — DESVENLAFAXINE SUCCINATE ER 100 MG PO TB24: 100.0000 mg | ORAL_TABLET | Freq: Every day | ORAL | 0 refills | Status: DC

## 2022-01-25 NOTE — Telephone Encounter (Signed)
Patient lvm at 9:50 with concerns regarding prescription for Pristiq. States that she was '50mg'$  but medication "quit being effective" dosage was increased to '100mg'$ . She said she has two pills left for today and tomorrow and needs new prescription sent. Ph: [phone] Appt 11/3 Pharmacy [facility] [place], [place]

## 2022-01-25 NOTE — Telephone Encounter (Signed)
Rx sent 

## 2022-01-26 DIAGNOSIS — Z23 Encounter for immunization: Secondary | ICD-10-CM | POA: Diagnosis not present

## 2022-01-26 DIAGNOSIS — I4821 Permanent atrial fibrillation: Secondary | ICD-10-CM | POA: Diagnosis not present

## 2022-01-26 DIAGNOSIS — G4733 Obstructive sleep apnea (adult) (pediatric): Secondary | ICD-10-CM | POA: Diagnosis not present

## 2022-01-26 DIAGNOSIS — E785 Hyperlipidemia, unspecified: Secondary | ICD-10-CM | POA: Diagnosis not present

## 2022-01-26 DIAGNOSIS — I700 Atherosclerosis of aorta: Secondary | ICD-10-CM | POA: Diagnosis not present

## 2022-01-26 DIAGNOSIS — E559 Vitamin D deficiency, unspecified: Secondary | ICD-10-CM | POA: Diagnosis not present

## 2022-01-26 DIAGNOSIS — I5032 Chronic diastolic (congestive) heart failure: Secondary | ICD-10-CM | POA: Diagnosis not present

## 2022-01-26 DIAGNOSIS — I10 Essential (primary) hypertension: Secondary | ICD-10-CM | POA: Diagnosis not present

## 2022-01-26 DIAGNOSIS — F330 Major depressive disorder, recurrent, mild: Secondary | ICD-10-CM | POA: Diagnosis not present

## 2022-01-26 DIAGNOSIS — Z1159 Encounter for screening for other viral diseases: Secondary | ICD-10-CM | POA: Diagnosis not present

## 2022-01-26 DIAGNOSIS — E039 Hypothyroidism, unspecified: Secondary | ICD-10-CM | POA: Diagnosis not present

## 2022-01-26 DIAGNOSIS — Z0000 Encounter for general adult medical examination without abnormal findings: Secondary | ICD-10-CM | POA: Diagnosis not present

## 2022-01-31 ENCOUNTER — Other Ambulatory Visit: Payer: Self-pay | Admitting: Family Medicine

## 2022-01-31 DIAGNOSIS — Z1231 Encounter for screening mammogram for malignant neoplasm of breast: Secondary | ICD-10-CM

## 2022-02-01 ENCOUNTER — Other Ambulatory Visit: Payer: Self-pay | Admitting: Family Medicine

## 2022-02-01 DIAGNOSIS — Z1211 Encounter for screening for malignant neoplasm of colon: Secondary | ICD-10-CM

## 2022-02-03 ENCOUNTER — Encounter: Payer: Self-pay | Admitting: Cardiovascular Disease

## 2022-02-05 MED ORDER — ROSUVASTATIN CALCIUM 20 MG PO TABS: 20.0000 mg | ORAL_TABLET | Freq: Every day | ORAL | 1 refills | Status: DC

## 2022-02-05 MED ORDER — ENTRESTO 97-103 MG PO TABS: 1.0000 | ORAL_TABLET | Freq: Two times a day (BID) | ORAL | 1 refills | Status: DC

## 2022-02-09 ENCOUNTER — Encounter: Payer: Self-pay | Admitting: Adult Health

## 2022-02-09 ENCOUNTER — Ambulatory Visit (INDEPENDENT_AMBULATORY_CARE_PROVIDER_SITE_OTHER): Payer: Medicare Other | Admitting: Adult Health

## 2022-02-09 ENCOUNTER — Ambulatory Visit: Payer: Medicare Other | Admitting: Adult Health

## 2022-02-09 DIAGNOSIS — F331 Major depressive disorder, recurrent, moderate: Secondary | ICD-10-CM

## 2022-02-09 MED ORDER — REXULTI 0.5 MG PO TABS: ORAL_TABLET | ORAL | 0 refills | Status: DC

## 2022-02-09 NOTE — Progress Notes (Signed)
Patient r/s - power outage.

## 2022-02-09 NOTE — Progress Notes (Signed)
[name] [id number] 1946-03-01 76 y.o.  Subjective:   Patient ID:  [name] is a 76 y.o. (DOB 04-22-1945) female.  Chief Complaint: No chief complaint on file.   HPI [name] presents to the office today for follow-up of MDD.  Describes mood today as "better". Pleasant. Decreased tearfulness. Mood symptoms - reports decreased depression, but still not feeling well - has felt better for the past few days. Stating "everything seems to bother me". Feels more irritable than usual. Reports increased anxiety at times. Reports worry and rumination. Mood has improved. Stating "I'm not doing as well as I was". Has increased the Pristiq from '50mg'$  to '100mg'$  daily with little benefit. Willing to consider other options. Improved interest and motivation. Taking medications as prescribed.  Energy levels stable. Active, has not been walking on the tread mill for the past 2 weeks. a regular exercise routine - treadmill.  Enjoys some usual interests and activities. Married. Lives with husband and 2 [place] terriers. Has a son, age 48. Has one step daughter - 65. Spending time with family. Attends church.  Appetite adequate. Weight loss - 2 to 3 pounds. Sleeps well most nights. Averages 7 to 8.5 hours. Napping in the afternoon.. Focus and concentration stable. Completing tasks. Managing aspects of household. Retired - [employer]. Denies SI or HI.  Denies AH or VH. Denies self harm. Denies substance use.  Previous medication trials:  Xanax, Zoloft, Wellbutrin - some adverse reaction, Valium, Ambien, Lexapro   Flowsheet Row ED from 04/13/2021 in [facility] Admission (Discharged) from 10/14/2020 in [facility] CATH LAB ED to Hosp-Admission (Discharged) from 08/14/2020 in [facility] HF PCU  C-SSRS RISK CATEGORY No Risk No Risk No Risk        Review of Systems:  Review of Systems  Musculoskeletal:   Negative for gait problem.  Neurological:  Negative for tremors.  Psychiatric/Behavioral:         Please refer to HPI    Medications: I have reviewed the patient's current medications.  Current Outpatient Medications  Medication Sig Dispense Refill   acetaminophen (TYLENOL) 500 MG tablet Take 1,000 mg by mouth every 6 (six) hours as needed (pain).     calcium-vitamin D (OSCAL WITH D) 500-200 MG-UNIT per tablet Take 2 tablets by mouth in the morning.     carvedilol (COREG) 3.125 MG tablet Take 1 tablet (3.125 mg total) by mouth 2 (two) times daily with a meal. 180 tablet 3   Cholecalciferol (VITAMIN D) 2000 UNITS tablet Take 4,000 Units by mouth in the morning.     desvenlafaxine (PRISTIQ) 100 MG 24 hr tablet Take 1 tablet (100 mg total) by mouth daily. 30 tablet 0   digoxin (LANOXIN) 0.125 MG tablet Take 1 tablet (0.125 mg total) by mouth every other day. 45 tablet 3   famotidine (PEPCID) 10 MG tablet Take 10 mg by mouth as needed for heartburn or indigestion.     ferrous sulfate 325 (65 FE) MG tablet Take 325 mg by mouth in the morning.     furosemide (LASIX) 80 MG tablet Take 1 tablet by mouth daily. OKAY TO TAKE EXTRA FOR WEIGHT GAIN> 3LB/ DAILY OR 5LB/WEEKLY 90 tablet 3   levothyroxine (SYNTHROID, LEVOTHROID) 100 MCG tablet Take 100 mcg daily before breakfast by mouth.     Multiple Vitamin (MULTIVITAMIN WITH MINERALS) TABS Take 1 tablet by mouth in the morning.     oxymetazoline (AFRIN)  0.05 % nasal spray Place 1 spray into both nostrils daily as needed for congestion.     potassium chloride SA (KLOR-CON M) 20 MEQ tablet TAKE 2 TABLETS(40 MEQ) BY MOUTH DAILY 180 tablet 1   rosuvastatin (CRESTOR) 20 MG tablet Take 1 tablet (20 mg total) by mouth daily. 90 tablet 1   sacubitril-valsartan (ENTRESTO) 97-103 MG Take 1 tablet by mouth 2 (two) times daily. 180 tablet 1   traZODone (DESYREL) 50 MG tablet Take 50-100 mg by mouth at bedtime as needed.     tretinoin (RETIN-A) 0.05 % cream Apply 1  application topically at bedtime. Applied to face     warfarin (COUMADIN) 4 MG tablet TAKE 1 TABLET BY MOUTH DAILY AS DIRECTED BY COUMADIN CLINIC 100 tablet 1   No current facility-administered medications for this visit.    Medication Side Effects: None  Allergies:  Allergies  Allergen Reactions   Codeine Anaphylaxis    Jerking, involuntary jerking    Dilantin [Phenytoin Sodium Extended] Rash and Itching   Metoprolol Other (See Comments)    depression    Propoxyphene Other (See Comments)    Bad vivid dreams    Past Medical History:  Diagnosis Date   Anxiety    Aortic stenosis    status post aortic valve replacement with 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   Depression    DJD (degenerative joint disease) of knee    Dyslipidemia    GERD (gastroesophageal reflux disease)    "several years ago; none since" (11/12/2012)   Hyperlipidemia    Hyperlipidemia LDL goal <70 01/19/2017   Hypertension    Left atrial enlargement    Mitral regurgitation 06/25/2016   Mild moderate MR by echo 06/2016   Obesity    Persistent atrial fibrillation ([facility])    [facility]    On CPAP at 12cm H2O   [place]   in setting of INR greater than 2.2 shortly after AVR - now cleared by neurosurgery to maintain INR 2-2.5    Past Medical History, Surgical history, Social history, and Family history were reviewed and updated as appropriate.   Please see review of systems for further details on the patient's review from today.   Objective:   Physical Exam:  There were no vitals taken for this visit.  Physical Exam Constitutional:      General: She is not in acute distress. Musculoskeletal:        General: No deformity.  Neurological:     Mental Status: She is  alert and oriented to person, place, and time.     Coordination: Coordination normal.  Psychiatric:        Attention and Perception: Attention and perception normal. She does not perceive auditory or visual hallucinations.        Mood and Affect: Mood normal. Mood is not anxious or depressed. Affect is not labile, blunt, angry or inappropriate.        Speech: Speech normal.        Behavior: Behavior normal.        Thought Content: Thought content normal. Thought content is not paranoid or delusional. Thought content does not include homicidal or suicidal ideation. Thought content does not include homicidal or  suicidal plan.        Cognition and Memory: Cognition and memory normal.        Judgment: Judgment normal.     Comments: Insight intact     Lab Review:     Component Value Date/Time   NA 141 09/12/2021 1022   K 4.1 09/12/2021 1022   CL 101 09/12/2021 1022   CO2 26 09/12/2021 1022   GLUCOSE 104 (H) 09/12/2021 1022   GLUCOSE 91 04/12/2021 1816   BUN 19 09/12/2021 1022   CREATININE 1.08 (H) 09/12/2021 1022   CREATININE 0.82 12/28/2015 1000   CALCIUM 9.6 09/12/2021 1022   PROT 7.4 08/14/2020 1730   PROT 7.1 05/27/2018 1125   ALBUMIN 3.8 08/14/2020 1730   ALBUMIN 4.5 05/27/2018 1125   AST 26 08/14/2020 1730   ALT 19 08/14/2020 1730   ALKPHOS 63 08/14/2020 1730   BILITOT 0.9 08/14/2020 1730   BILITOT 0.4 05/27/2018 1125   GFRNONAA >60 04/12/2021 1816   GFRAA 82 02/05/2020 1120       Component Value Date/Time   WBC 4.9 04/12/2021 1816   RBC 4.00 04/12/2021 1816   HGB 12.6 04/12/2021 1816   HGB 11.7 09/28/2020 1017   HCT 37.3 04/12/2021 1816   HCT 34.6 09/28/2020 1017   PLT 192 04/12/2021 1816   PLT 207 09/28/2020 1017   MCV 93.3 04/12/2021 1816   MCV 92 09/28/2020 1017   MCH 31.5 04/12/2021 1816   MCHC 33.8 04/12/2021 1816   RDW 12.8 04/12/2021 1816   RDW 12.6 09/28/2020 1017   LYMPHSABS 0.9 09/28/2020 1017   MONOABS 0.6 08/18/2020 0338   EOSABS 0.2 09/28/2020  1017   BASOSABS 0.0 09/28/2020 1017    No results found for: "POCLITH", "LITHIUM"   No results found for: "PHENYTOIN", "PHENOBARB", "VALPROATE", "CBMZ"   .res Assessment: Plan:    Plan:  PDMP reviewed  Trazadone '50mg'$  for sleep Pristiq '100mg'$  daily   Add Rexulti 0.'25mg'$  x 2 weeks, then continue or increase to 0.'5mg'$  daily. Samples given  Time spent with patient was 20 minutes. Greater than 50% of face to face time with patient was spent on counseling and coordination of care.    RTC 4 weeks  Patient advised to contact office with any questions, adverse effects, or acute worsening in signs and symptoms.  Diagnoses and all orders for this visit:  Major depressive disorder, recurrent episode, moderate ([place])     Please see After Visit Summary for patient specific instructions.  Future Appointments  Date Time Provider [place]  02/13/2022 10:15 AM [facility] 3 [id number]  02/15/2022 10:00 AM Croitoru, Mihai, MD [facility] None  02/15/2022 11:15 AM [facility] None  03/20/2022  8:30 AM GI-315 CT 2 GI[facility] [facility]  03/27/2022 10:20 AM [facility] MM 3 [facility]    No orders of the defined types were placed in this encounter.   -------------------------------

## 2022-02-13 ENCOUNTER — Ambulatory Visit (HOSPITAL_COMMUNITY): Payer: Medicare Other | Attending: Cardiovascular Disease

## 2022-02-13 DIAGNOSIS — I340 Nonrheumatic mitral (valve) insufficiency: Secondary | ICD-10-CM | POA: Insufficient documentation

## 2022-02-13 LAB — ECHOCARDIOGRAM COMPLETE
AR max vel: 1.06 cm2
AV Area VTI: 1.13 cm2
AV Area mean vel: 1.11 cm2
AV Mean grad: 9.4 mmHg
AV Peak grad: 17.9 mmHg
Ao pk vel: 2.11 m/s
Area-P 1/2: 3.66 cm2
S' Lateral: 4 cm

## 2022-02-15 ENCOUNTER — Ambulatory Visit: Payer: Medicare Other | Attending: Cardiovascular Disease | Admitting: Cardiovascular Disease

## 2022-02-15 ENCOUNTER — Encounter: Payer: Self-pay | Admitting: Cardiovascular Disease

## 2022-02-15 ENCOUNTER — Ambulatory Visit (INDEPENDENT_AMBULATORY_CARE_PROVIDER_SITE_OTHER): Payer: Medicare Other | Admitting: *Deleted

## 2022-02-15 VITALS — BP 115/76 | HR 69 | Ht 64.0 in | Wt 206.8 lb

## 2022-02-15 DIAGNOSIS — I7121 Aneurysm of the ascending aorta, without rupture: Secondary | ICD-10-CM | POA: Diagnosis not present

## 2022-02-15 DIAGNOSIS — Z79899 Other long term (current) drug therapy: Secondary | ICD-10-CM | POA: Diagnosis not present

## 2022-02-15 DIAGNOSIS — I340 Nonrheumatic mitral (valve) insufficiency: Secondary | ICD-10-CM

## 2022-02-15 DIAGNOSIS — Z5181 Encounter for therapeutic drug level monitoring: Secondary | ICD-10-CM | POA: Diagnosis not present

## 2022-02-15 DIAGNOSIS — I480 Paroxysmal atrial fibrillation: Secondary | ICD-10-CM | POA: Diagnosis not present

## 2022-02-15 DIAGNOSIS — I700 Atherosclerosis of aorta: Secondary | ICD-10-CM

## 2022-02-15 DIAGNOSIS — I4819 Other persistent atrial fibrillation: Secondary | ICD-10-CM | POA: Diagnosis not present

## 2022-02-15 DIAGNOSIS — I5032 Chronic diastolic (congestive) heart failure: Secondary | ICD-10-CM | POA: Diagnosis not present

## 2022-02-15 DIAGNOSIS — Z952 Presence of prosthetic heart valve: Secondary | ICD-10-CM

## 2022-02-15 DIAGNOSIS — E7800 Pure hypercholesterolemia, unspecified: Secondary | ICD-10-CM

## 2022-02-15 DIAGNOSIS — D6869 Other thrombophilia: Secondary | ICD-10-CM

## 2022-02-15 LAB — POCT INR: INR: 1.6 — AB (ref 2.0–3.0)

## 2022-02-15 NOTE — Patient Instructions (Addendum)
Medication Instructions:  The current medical regimen is effective;  continue present plan and medications.  *If you need a refill on your cardiac medications before your next appointment, please call your pharmacy*   Testing/Procedures:  Echocardiogram (12 months) - Your physician has requested that you have an echocardiogram. Echocardiography is a painless test that uses sound waves to create images of your heart. It provides your doctor with information about the size and shape of your heart and how well your heart's chambers and valves are working. This procedure takes approximately one hour. There are no restrictions for this procedure.    Follow-Up: At [facility], you and your health needs are our priority.  As part of our continuing mission to provide you with exceptional heart care, we have created designated Provider Care Teams.  These Care Teams include your primary Cardiologist (physician) and Advanced Practice Providers (APPs -  Physician Assistants and Nurse Practitioners) who all work together to provide you with the care you need, when you need it.  We recommend signing up for the patient portal called "MyChart".  Sign up information is provided on this After Visit Summary.  MyChart is used to connect with patients for Virtual Visits (Telemedicine).  Patients are able to view lab/test results, encounter notes, upcoming appointments, etc.  Non-urgent messages can be sent to your provider as well.   To learn more about what you can do with MyChart, go to [email or web address].    Your next appointment:   12 month(s)  The format for your next appointment:   In Person  Provider:   Sanda Klein, MD

## 2022-02-15 NOTE — Patient Instructions (Signed)
Description   Today take 1.5 tablets then continue taking Warfarin 1 tablet daily. Recheck INR in 4 weeks. [facility]in [facility] [phone]

## 2022-02-15 NOTE — Progress Notes (Signed)
Cardiology Office Note:    Date:  02/18/2022   ID:  [name], DOB [birth year], MRN [id number]  PCP:  Glenis Smoker, MD  [facility] Cardiologist:  Sanda Klein, MD  [place] Electrophysiologist:  None   Referring MD: Glenis Smoker, *   Chief Complaint  Patient presents with   Atrial Fibrillation   Cardiac Valve Problem     History of Present Illness:    [name] is a 76 y.o. 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  She has been doing well.  Ventricular rate control is good even after we reduced dose of digoxin.  She has noticed that she has less diarrhea since then, but she still has fatigue.  She has symptoms of depression.  Her medications have been repeatedly adjusted with transient benefit. She still feels depressed.  The patient specifically denies any chest pain at rest exertion, dyspnea at rest or with exertion, orthopnea, paroxysmal nocturnal dyspnea, syncope, palpitations, focal neurological deficits, intermittent claudication, lower extremity edema, unexplained weight gain, cough, hemoptysis or wheezing.  She has not had any falls, serious injuries or bleeding problems.  INR= 2.4 previous 2 months, 1.6 today.  Most recent echo is unchanged with normal LVEF and moderate to severe MR.  Previously poorly tolerant of  metoprolol, which seemed to cause depression, so used carvedilol.    She was most recently hospitalized in June 2021 with acute heart failure exacerbation and her echocardiogram showed decreased LVEF to 30-35% as well as moderately reduced right ventricular systolic function and severely elevated pulmonary artery hypertension, despite normal function of the aortic valve prosthesis.  The cause of LVEF decline was uncertain, but she responded well to discontinuation of diltiazem and treatment with diuretics, Entresto, carvedilol (the doses have been limited by low blood pressure).  In the past she reportedly did not tolerate beta-blockers due to depression, but the current low-dose of carvedilol seems to be working well for her. Follow-up echocardiogram performed in October 2021 showed only partial improvement in LVEF now up to 40-45% in the left ventricle remain dilated with an end-systolic diameter of 49 mm.  Mitral regurgitation remains severe with a central jet.  She underwent a TEE on 07/12/2020 that shows further improvement in LVEF which is now almost normal at 50-55%.  It also showed that the mitral regurgitation is central and squarely in the moderate to severe range (there was no reversal of flow in the pulmonary veins, all calculated parameters suggested higher and of moderate MR).  Anatomically, the valve appears appropriate for MitraClip, without major leaflet retraction and with a mean gradient of 3 mmHg at a heart rate around 100 bpm.  She underwent right and left heart catheterization on 10/14/2020.  This showed excellent hemodynamic values and normal pulmonary artery pressure.  The PAWP was only 10 mmHg and the V wave was not prominent.  It appeared that she did not require treatment for the mitral insufficiency at that point.  Follow-up transthoracic echocardiogram performed 02/07/2021 shows  complete normalization of LVEF to 55%, moderate to severe mitral regurgitation, normal parameters of  the mechanical aortic valve prosthesis with a mean gradient of 10 mmHg.  Pulmonary function test performed in May 2021 were abnormal with restrictive findings: FEV1 of 39% of predicted, commensurate FVC 40% of predicted.  Her pulmonary specialist is Dr. Ander Slade. She reports compliance with CPAP.    Past Medical History:  Diagnosis Date   Anxiety    Aortic stenosis    status post aortic valve replacement with St. Jude mechanical prosthesis   Ascending aorta dilatation ([place]) 06/25/2016   31m by echo 06/2016 and 471mby CT angin 2016   Chronic anticoagulation    Complication of anesthesia    pt states paralyzed diaphragm after AVR   Depression    DJD (degenerative joint disease) of knee    Dyslipidemia    GERD (gastroesophageal reflux disease)    "several years ago; none since" (11/12/2012)   Hyperlipidemia    Hyperlipidemia LDL goal <70 01/19/2017   Hypertension    Left atrial enlargement    Mitral regurgitation 06/25/2016   Mild moderate MR by echo 06/2016   Obesity    Persistent atrial fibrillation (HCC)    [facility]    On CPAP at 12cm H2O   [place]   in setting of INR greater than 2.2 shortly after AVR - now cleared by neurosurgery to maintain INR 2-2.5    Past Surgical History:  Procedure Laterality Date   BURR HOLE FOR SUBDURAL HEMATOMA  2006   CARDIAC VALVE REPLACEMENT  2006   St. Jude AVR   CARDIOVERSION N/A 07/22/2012   Procedure: CARDIOVERSION;  Surgeon: MaCandee FurbishMD;  [facility];  Service: Cardiovascular;  Laterality: N/A;   CARDIOVERSION N/A 11/25/2012   Procedure: CARDIOVERSION;  Surgeon: TrSueanne MargaritaMD;  [facility];  Service: Cardiovascular;  Laterality: N/A;   CARDIOVERSION N/A 12/30/2012   Procedure: CARDIOVERSION;  Surgeon: TrSueanne MargaritaMD;  [facility];  Service: Cardiovascular;  Laterality: N/A;  h&p in file-HW    CARDIOVERSION N/A 03/30/2013   Procedure:  CARDIOVERSION;  Surgeon: TrSueanne MargaritaMD;  [facility];  Service: Cardiovascular;  Laterality: N/A;   ELECTROPHYSIOLOGIC STUDY  11/2013   [facility]   KNEE ARTHROSCOPY Left 1980's   "2" (11/12/2012)   KNEE SURGERY Left 1980's   "after 2 scopes they went in and did some kind of OR" (11/12/2012)   RIGHT/LEFT HEART CATH AND CORONARY ANGIOGRAPHY N/A 10/14/2020   Procedure: RIGHT/LEFT HEART CATH AND CORONARY ANGIOGRAPHY;  Surgeon: JoMartiniquePeter M, MD;  [facility];  Service: Cardiovascular;  Laterality: N/A;   TEE WITHOUT CARDIOVERSION N/A 07/22/2012   Procedure: TRANSESOPHAGEAL ECHOCARDIOGRAM (TEE);  Surgeon: MaCandee FurbishMD;  [facility];  Service: Cardiovascular;  Laterality: N/A;  Rm 2034   TEE WITHOUT CARDIOVERSION N/A 10/07/2013   Procedure: TRANSESOPHAGEAL ECHOCARDIOGRAM (TEE);  Surgeon: MaCandee FurbishMD;  [facility];  Service: Cardiovascular;  Laterality: N/A;   TEE WITHOUT CARDIOVERSION N/A 07/12/2020   Procedure: TRANSESOPHAGEAL ECHOCARDIOGRAM (TEE);  Surgeon: CrSanda KleinMD;  [place] [facility] Service: Cardiovascular;  Laterality: N/A;   TOTAL KNEE ARTHROPLASTY Left 11/05/2014   Procedure: TOTAL KNEE ARTHROPLASTY;  Surgeon: JoDorna LeitzMD;  [facility]: Orthopedics;  Laterality: Left;  TUBAL LIGATION  1980's    Current Medications: Current Meds  Medication Sig   acetaminophen (TYLENOL) 500 MG tablet Take 1,000 mg by mouth every 6 (six) hours as needed (pain).   calcium-vitamin D (OSCAL WITH D) 500-200 MG-UNIT per tablet Take 2 tablets by mouth in the morning.   carvedilol (COREG) 3.125 MG tablet Take 1 tablet (3.125 mg total) by mouth 2 (two) times daily with a meal.   Cholecalciferol (VITAMIN D) 2000 UNITS tablet Take 4,000 Units by mouth in the morning.   desvenlafaxine (PRISTIQ) 100 MG 24 hr tablet Take 1 tablet (100 mg total) by mouth  daily.   digoxin (LANOXIN) 0.125 MG tablet Take 1 tablet (0.125 mg total) by mouth every other day.   ferrous sulfate 325 (65 FE) MG tablet Take 325 mg by mouth in the morning.   furosemide (LASIX) 80 MG tablet Take 1 tablet by mouth daily. OKAY TO TAKE EXTRA FOR WEIGHT GAIN> 3LB/ DAILY OR 5LB/WEEKLY   levothyroxine (SYNTHROID, LEVOTHROID) 100 MCG tablet Take 100 mcg daily before breakfast by mouth.   Multiple Vitamin (MULTIVITAMIN WITH MINERALS) TABS Take 1 tablet by mouth in the morning.   oxymetazoline (AFRIN) 0.05 % nasal spray Place 1 spray into both nostrils daily as needed for congestion.   potassium chloride SA (KLOR-CON M) 20 MEQ tablet TAKE 2 TABLETS(40 MEQ) BY MOUTH DAILY   rosuvastatin (CRESTOR) 20 MG tablet Take 1 tablet (20 mg total) by mouth daily.   sacubitril-valsartan (ENTRESTO) 97-103 MG Take 1 tablet by mouth 2 (two) times daily.   traZODone (DESYREL) 50 MG tablet Take 50-100 mg by mouth at bedtime as needed.   tretinoin (RETIN-A) 0.05 % cream Apply 1 application topically at bedtime. Applied to face   warfarin (COUMADIN) 4 MG tablet TAKE 1 TABLET BY MOUTH DAILY AS DIRECTED BY COUMADIN CLINIC     Allergies:   Codeine, Dilantin [phenytoin sodium extended], Metoprolol, and Propoxyphene   Social History   Socioeconomic History   Marital status: Married    Spouse name: Not on file   Number of children: Not on file   Years of education: Not on file   Highest education level: Not on file  Occupational History   Not on file  Tobacco Use   Smoking status: Never   Smokeless tobacco: Never   Tobacco comments:    Never smoke 06/22/21  Substance and Sexual Activity   Alcohol use: No    Comment: 11/12/2012 "no alcohol in the last 9 years"   Drug use: No   Sexual activity: Not Currently  Other Topics Concern   Not on file  Social History Narrative   Pt lives in [place] with spouse.  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 [employer] Insecurity: Not on file  Transportation Needs: Not on file  Physical Activity: Inactive (09/30/2019)   Exercise Vital Sign    Days of Exercise per Week: 0 days    Minutes of Exercise per Session: 0 min  Stress: Not on file  Social Connections: Unknown (09/30/2019)   Social Connection and Isolation Panel [NHANES]    Frequency of Communication with Friends and Family: Once a week    Frequency of Social Gatherings with Friends and Family: Not on file    Attends Religious Services: 1 to 4 times per year    Active Member of [employer] or Organizations: No    Attends Archivist Meetings: Never  Marital Status: Married     Family History: The patient's family history includes Heart disease in her brother; Hyperlipidemia in her father; Hypertension in her father and mother; Lung cancer in her mother.  ROS:   Please see the history of present illness.     All other systems reviewed and are negative.  EKGs/Labs/Other Studies Reviewed:    The following studies were reviewed today: TTE 02-09-2020 1. Systolic Index of contractility has improved from 669 to 865 mmHg/s.Marland Kitchen  Left ventricular ejection fraction, by estimation, is 35 to 40%. Left  ventricular ejection fraction by PLAX is 40 %. The left ventricle has  moderately decreased function. The left  ventricle demonstrates global hypokinesis. The left ventricular internal  cavity size was mildly to moderately dilated. There is mild concentric  left ventricular hypertrophy. Left ventricular diastolic parameters are  indeterminate.   2. Right ventricular systolic function is moderately reduced. The right  ventricular size is moderately enlarged. There is moderately elevated  pulmonary artery systolic pressure. The estimated right ventricular  systolic pressure is 27.7 mmHg.   3. Left atrial size was severely dilated.   4. Right atrial size was severely dilated.   5. Central mitral regurgitation; suspect atrial dilation  contributes to  mechanism . The mitral valve is grossly normal. Severe mitral valve  regurgitation.   6. Prosthetic DVI 0.33, AT < 100 ms. The aortic valve was not well  visualized. Aortic valve regurgitation is not visualized. There is a 21 mm  St. Jude St. Jude Reagent mechanical valve present in the aortic position.  Procedure Date: 2006.   7. Aortic dilatation noted. There is mild dilatation of the aortic root,  measuring 40 mm. There is moderate dilatation of the ascending aorta,  measuring 47 mm.   8. The inferior vena cava is normal in size with <50% respiratory  variability, suggesting right atrial pressure of 8 mmHg.   TEE 07/12/2020  1. Left ventricular ejection fraction, by estimation, is 50 to 55%. The  left ventricle has low normal function. The left ventricle demonstrates  global hypokinesis. Left ventricular diastolic function could not be  evaluated.   2. Left atrial size was severely dilated. No left atrial/left atrial  appendage thrombus was detected. The LAA emptying velocity was 50 cm/s.  (the rhythm was atrial flutter).   3. Right atrial size was severely dilated.   4. The mitral valve is rheumatic. Moderate to severe mitral valve  regurgitation. No evidence of mitral stenosis. The mean mitral valve  gradient is 3.0 mmHg.   5. Tricuspid valve regurgitation is moderate.   6. The aortic valve has been repaired/replaced. Aortic valve  regurgitation is trivial. No aortic stenosis is present. There is a 19 mm  mechanical valve present in the aortic position. Echo findings are  consistent with normal structure and function of  the aortic valve prosthesis. Aortic valve mean gradient measures 3.0 mmHg.  Aortic valve Vmax measures 1.09 m/s.   7. Aortic dilatation noted. There is mild dilatation of the ascending  aorta, measuring 40 mm. There is Moderate (Grade III) atheroma plaque  involving the descending aorta.   8. Evidence of atrial level shunting detected by color  flow Doppler.  There is a small patent foramen ovale with predominantly left to right  shunting across the atrial septum.   9. Right ventricular systolic function is normal. The right ventricular  size is normal. There is mildly elevated pulmonary artery systolic  pressure.   Echo 02/07/2021 1. Left  ventricular ejection fraction, by estimation, is 55%. The left  ventricle has normal function. The left ventricle has no regional wall  motion abnormalities. Left ventricular diastolic parameters are consistent  with Grade II diastolic dysfunction  (pseudonormalization).   2. Mildly D-shaped septum consistent with RV pressure/volume overload.  Right ventricular systolic function is moderately reduced. The right  ventricular size is mildly enlarged. There is normal pulmonary artery  systolic pressure. The estimated right  ventricular systolic pressure is 17.4 mmHg.   3. Left atrial size was moderately dilated.   4. Right atrial size was severely dilated.   5. The mitral valve is abnormal with thickening and calcification, the  posterior leaflet is restricted. Possible rheumatic valve disease. There  is moderate to severe mitral valve regurgitation, PISA does not appear  accurate. No evidence of mitral  stenosis. Moderate mitral annular calcification.   6. St Jude mechanical aortic valve. Trivial regurgitation. Mean gradient  10 mmHg. Normal function.   7. Aortic dilatation noted. There is moderate dilatation of the ascending  aorta, measuring 45 mm.   8. The inferior vena cava is normal in size with greater than 50%  respiratory variability, suggesting right atrial pressure of 3 mmHg.   Comparison(s): 08/15/20 EF 35-40%. PA pressure 9mHg. AV 178mg mean PG,  3371m peak PG. Moderate-severe MR.  Cardiac cath 10/12/2020 1. Normal coronary anatomy 2. Borderline pulmonary HTN. Mean PAP 22 mmHg 3. Normal PCWP mean 10 mm Hg. V wave does not appear prominent 4. Normal cardiac output.    Plan: resume anticoagulation. Continue medical therapy.  Fick Cardiac Output 8.73 L/min  Fick Cardiac Output Index 4.18 (L/min)/BSA  RA A Wave 5 mmHg  RA V Wave 6 mmHg  RA Mean 3 mmHg  RV Systolic Pressure 42 mmHg  RV Diastolic Pressure 0 mmHg  RV EDP 5 mmHg  PA Systolic Pressure 41 mmHg  PA Diastolic Pressure 11 mmHg  PA Mean 22 mmHg  PW A Wave 12 mmHg  PW V Wave 17 mmHg  PW Mean 10 mmHg  AO Systolic Pressure 111081Hg  AO Diastolic Pressure 49 mmHg  AO Mean 76 mmHg  QP/QS 1  TPVR Index 5.26 HRUI  TSVR Index 18.19 HRUI  PVR SVR Ratio 0.16  TPVR/TSVR Ratio 0.29    ECHO 02/13/2022    1. Left ventricular ejection fraction, by estimation, is 55 to 60%. The  left ventricle has normal function. The left ventricle has no regional  wall motion abnormalities. Left ventricular diastolic parameters are  indeterminate.   2. Right ventricular systolic function is mildly reduced. The right  ventricular size is mildly enlarged.   3. Left atrial size was severely dilated.   4. Right atrial size was severely dilated.   5. The mitral valve is degenerative. Moderate to severe mitral valve  regurgitation. No evidence of mitral stenosis.   6. Tricuspid valve regurgitation is mild to moderate.   7. The aortic valve has been repaired/replaced. Aortic valve  regurgitation is not visualized. No aortic stenosis is present.   8. There is mild dilatation of the aortic root, measuring 37 mm. There is  moderate dilatation of the ascending aorta, measuring 42 mm.   9. The inferior vena cava is normal in size with greater than 50%  respiratory variability, suggesting right atrial pressure of 3 mmHg.    EKG:  EKG is not ordered today and reviewed the study from 06/22/2021 which shows atrial fibrillation with a ventricular rate of 118 bpm, right bundle branch  block and borderline QTc 501 ms Recent Labs: 04/12/2021: Hemoglobin 12.6; Platelets 192 09/12/2021: BUN 19; Creatinine, Ser 1.08; Potassium 4.1;  Sodium 141; TSH 2.910  12/27/2020 Hemoglobin 12.0, creatinine 0.71, potassium 4.1, ALT 16, TSH 1.54 Recent Lipid Panel    Component Value Date/Time   CHOL 170 05/24/2021 1054   TRIG 94 05/24/2021 1054   HDL 64 05/24/2021 1054   CHOLHDL 2.7 05/24/2021 1054   LDLCALC 89 05/24/2021 1054   01/05/2021 cholesterol 205, HDL 54, LDL 126, triglycerides 136.  Physical Exam:    VS:  BP 115/76 (BP Location: Left Arm, Patient Position: Sitting, Cuff Size: Large)   Pulse 69   Ht '5\' 4"'$  (1.626 m)   Wt 93.8 kg   SpO2 98%   BMI 35.50 kg/m     Wt Readings from Last 3 Encounters:  02/15/22 93.8 kg  10/18/21 96.3 kg  09/12/21 97.5 kg      General: Alert, oriented x3, no distress, moderately obese Head: no evidence of trauma, PERRL, EOMI, no exophtalmos or lid lag, no myxedema, no xanthelasma; normal ears, nose and oropharynx Neck: normal jugular venous pulsations and no hepatojugular reflux; brisk carotid pulses without delay and no carotid bruits Chest: clear to auscultation, no signs of consolidation by percussion or palpation, normal fremitus, symmetrical and full respiratory excursions Cardiovascular: normal position and quality of the apical impulse, irregular rhythm, normal first and second heart sounds/mechanical valve cicks, 1/6 holosystolic apical murmur, no diastolic murmurs, rubs or gallops Abdomen: no tenderness or distention, no masses by palpation, no abnormal pulsatility or arterial bruits, normal bowel sounds, no hepatosplenomegaly Extremities: no clubbing, cyanosis or edema; 2+ radial, ulnar and brachial pulses bilaterally; 2+ right femoral, posterior tibial and dorsalis pedis pulses; 2+ left femoral, posterior tibial and dorsalis pedis pulses; no subclavian or femoral bruits Neurological: grossly nonfocal Psych: Normal mood and affect  ASSESSMENT:    1. Chronic diastolic CHF (congestive heart failure) ([place])   2. Severe mitral regurgitation   3. Persistent atrial fibrillation  ([place])   4. Encounter for monitoring digoxin therapy   5. H/O mechanical aortic valve replacement   6. Acquired thrombophilia ([place])   7. Aneurysm of ascending aorta without rupture ([place])   8. Atherosclerosis of aorta ([place])   9. Hypercholesterolemia         PLAN:    In order of problems listed above:  CHF: Has fatigue rather than dyspnea, weight is lower. Clinically euvolemic.   Excellent hemodynamic parameters at the time of the right heart catheterization 10/14/2020 when she weighed 215 pounds, now 206 (probably true weight loss).   LVEF has returned to normal, suggesting that the mechanism may indeed have been tachycardia related, not related to mitral valve disease.  She does not have any signs or symptoms of coronary disease or any perfusion abnormalities on the nuclear stress test from May 2021.   MR: On physical exam the murmur remains very unimpressive.  By echo, continues to be moderate to severe even though LV systolic function has normalized.  Check yearly echo for signs of LV dilation or decreasing EF. Marland Kitchen  At cardiac catheterization LV filling pressures were very low.  Anatomically, she does have appropriate findings for MitraClip should this become necessary in the future.   AFib: Now a permanent arrhythmia.  She has had 2 previous ablation procedures and has failed both amiodarone and dofetilide.  I do not think we have any other good antiarrhythmic alternatives.  Often has a relatively regular rhythm suggesting a  competing junctional pacemaker.  Fully anticoagulated for mechanical valve.   Digoxin: Diarrhea improved with dose reduction. Fatigue persists and may be depression related. Mech AVR: Normal function.  Normal gradients across the mechanical prosthesis (mean gradient 10 mmHg for size 19 St. Jude [employer]ent dual leaflet).  Recheck echocardiogram in November. Anticoagulation: Denies falls or bleeding issues.  Had some very broad swings in INR last month, the only culprit I can  identify the fact that she was using antibiotic eyedrops for several days.  She has stopped these and she is now back in therapeutic range with her anticoagulation.  She does have a history of serious bleeding complications on 2 occasions (subdural hematoma 2006 and rectus sheath tumor 2019). OSA: She is compliant with CPAP and does not have daytime hypersomnolence Restrictive lung disease: PFTs from 2018 through 2021 showed reductions in FVC of 40-56% of predicted.  This may be a significant part of her chronic dyspnea.  Fairly significant reduction in FVC, at least in part attributable to obesity.  Also has moderate thoracic dextroscoliosis.  Follow-up with Dr. Ander Slade. Asc Ao dilation: Stable in size by CT and by recnt echo. CT scan in 2017 documented an ascending aorta diameter 4.3 cm, CT that we performed on 07/13/2021 shows exactly the same measurement of 4.3 cm, echo Nov 2023 4.2 cm. Aortic atherosclerosis: Mild atherosclerosis of the thoracic aorta on the most recent CT April 2023, and abdominal aortic atherosclerosis is described on CT from 2019.   HLP: She is on statin.  Target LDL <100 appears sufficient. She does have aortic atherosclerosis but no coronary disease, history of stroke, angina or revascularization procedures.   Depression: has been trying new therapies, but not reached a good spot yet. Will be trying brexpiprazole. No interaction with her meds.  Medication Adjustments/Labs and Tests Ordered: Current medicines are reviewed at length with the patient today.  Concerns regarding medicines are outlined above.  Orders Placed This Encounter  Procedures   ECHOCARDIOGRAM COMPLETE     No orders of the defined types were placed in this encounter.    Patient Instructions  Medication Instructions:  The current medical regimen is effective;  continue present plan and medications.  *If you need a refill on your cardiac medications before your next appointment, please call your  pharmacy*   Testing/Procedures:  Echocardiogram (12 months) - Your physician has requested that you have an echocardiogram. Echocardiography is a painless test that uses sound waves to create images of your heart. It provides your doctor with information about the size and shape of your heart and how well your heart's chambers and valves are working. This procedure takes approximately one hour. There are no restrictions for this procedure.    Follow-Up: At [facility], you and your health needs are our priority.  As part of our continuing mission to provide you with exceptional heart care, we have created designated Provider Care Teams.  These Care Teams include your primary Cardiologist (physician) and Advanced Practice Providers (APPs -  Physician Assistants and Nurse Practitioners) who all work together to provide you with the care you need, when you need it.  We recommend signing up for the patient portal called "MyChart".  Sign up information is provided on this After Visit Summary.  MyChart is used to connect with patients for Virtual Visits (Telemedicine).  Patients are able to view lab/test results, encounter notes, upcoming appointments, etc.  Non-urgent messages can be sent to your provider as well.   To learn  more about what you can do with MyChart, go to [email or web address].    Your next appointment:   12 month(s)  The format for your next appointment:   In Person  Provider:   Sanda Klein, MD            Signed, Sanda Klein, MD  02/18/2022 7:16 PM    [place]

## 2022-02-18 ENCOUNTER — Encounter: Payer: Self-pay | Admitting: Cardiovascular Disease

## 2022-02-24 ENCOUNTER — Other Ambulatory Visit: Payer: Self-pay | Admitting: Adult Health

## 2022-02-24 DIAGNOSIS — F331 Major depressive disorder, recurrent, moderate: Secondary | ICD-10-CM

## 2022-02-26 NOTE — Telephone Encounter (Signed)
Pt called stating the generic Pristiq finally kicked in and working.

## 2022-02-27 DIAGNOSIS — I8311 Varicose veins of right lower extremity with inflammation: Secondary | ICD-10-CM | POA: Diagnosis not present

## 2022-02-27 DIAGNOSIS — I872 Venous insufficiency (chronic) (peripheral): Secondary | ICD-10-CM | POA: Diagnosis not present

## 2022-02-27 DIAGNOSIS — L570 Actinic keratosis: Secondary | ICD-10-CM | POA: Diagnosis not present

## 2022-02-27 DIAGNOSIS — L738 Other specified follicular disorders: Secondary | ICD-10-CM | POA: Diagnosis not present

## 2022-02-27 DIAGNOSIS — L820 Inflamed seborrheic keratosis: Secondary | ICD-10-CM | POA: Diagnosis not present

## 2022-02-27 DIAGNOSIS — I8312 Varicose veins of left lower extremity with inflammation: Secondary | ICD-10-CM | POA: Diagnosis not present

## 2022-02-27 DIAGNOSIS — M6740 Ganglion, unspecified site: Secondary | ICD-10-CM | POA: Diagnosis not present

## 2022-02-27 DIAGNOSIS — L720 Epidermal cyst: Secondary | ICD-10-CM | POA: Diagnosis not present

## 2022-02-27 DIAGNOSIS — D1801 Hemangioma of skin and subcutaneous tissue: Secondary | ICD-10-CM | POA: Diagnosis not present

## 2022-02-27 DIAGNOSIS — L821 Other seborrheic keratosis: Secondary | ICD-10-CM | POA: Diagnosis not present

## 2022-02-27 DIAGNOSIS — Z85828 Personal history of other malignant neoplasm of skin: Secondary | ICD-10-CM | POA: Diagnosis not present

## 2022-03-07 DIAGNOSIS — J069 Acute upper respiratory infection, unspecified: Secondary | ICD-10-CM | POA: Diagnosis not present

## 2022-03-07 DIAGNOSIS — R059 Cough, unspecified: Secondary | ICD-10-CM | POA: Diagnosis not present

## 2022-03-09 ENCOUNTER — Ambulatory Visit: Payer: Medicare Other | Admitting: Adult Health

## 2022-03-18 ENCOUNTER — Encounter: Payer: Self-pay | Admitting: Cardiovascular Disease

## 2022-03-19 ENCOUNTER — Telehealth: Payer: Self-pay | Admitting: Adult Health

## 2022-03-19 ENCOUNTER — Other Ambulatory Visit: Payer: Self-pay

## 2022-03-19 DIAGNOSIS — F331 Major depressive disorder, recurrent, moderate: Secondary | ICD-10-CM

## 2022-03-19 MED ORDER — DESVENLAFAXINE SUCCINATE ER 100 MG PO TB24: 100.0000 mg | ORAL_TABLET | Freq: Every day | ORAL | 0 refills | Status: DC

## 2022-03-19 NOTE — Telephone Encounter (Signed)
Pt LVM @ 11:15a.  She would like Pristiq '10mg'$  sent to [facility], [place].  No upcoming appts scheduled.

## 2022-03-19 NOTE — Telephone Encounter (Signed)
Rx sent 

## 2022-03-20 ENCOUNTER — Ambulatory Visit
Admission: RE | Admit: 2022-03-20 | Discharge: 2022-03-20 | Disposition: A | Discharge status: Home / Self Care | Payer: Medicare Other | Source: Ambulatory Visit | Attending: Family Medicine | Admitting: Family Medicine

## 2022-03-20 ENCOUNTER — Encounter: Payer: Self-pay | Admitting: Cardiovascular Disease

## 2022-03-20 ENCOUNTER — Ambulatory Visit: Payer: Medicare Other | Attending: Internal Medicine

## 2022-03-20 DIAGNOSIS — I480 Paroxysmal atrial fibrillation: Secondary | ICD-10-CM | POA: Diagnosis not present

## 2022-03-20 DIAGNOSIS — Z5181 Encounter for therapeutic drug level monitoring: Secondary | ICD-10-CM

## 2022-03-20 DIAGNOSIS — K5730 Diverticulosis of large intestine without perforation or abscess without bleeding: Secondary | ICD-10-CM | POA: Diagnosis not present

## 2022-03-20 DIAGNOSIS — K449 Diaphragmatic hernia without obstruction or gangrene: Secondary | ICD-10-CM | POA: Diagnosis not present

## 2022-03-20 DIAGNOSIS — K8020 Calculus of gallbladder without cholecystitis without obstruction: Secondary | ICD-10-CM | POA: Diagnosis not present

## 2022-03-20 DIAGNOSIS — Z1211 Encounter for screening for malignant neoplasm of colon: Secondary | ICD-10-CM

## 2022-03-20 LAB — POCT INR: INR: 2.2 (ref 2.0–3.0)

## 2022-03-20 MED ORDER — FUROSEMIDE 80 MG PO TABS: ORAL_TABLET | ORAL | 3 refills | Status: DC

## 2022-03-20 NOTE — Patient Instructions (Signed)
continue taking Warfarin 1 tablet daily. Recheck INR in 4 weeks. [facility] [phone]

## 2022-03-27 ENCOUNTER — Ambulatory Visit: Payer: Medicare Other

## 2022-03-27 DIAGNOSIS — R059 Cough, unspecified: Secondary | ICD-10-CM | POA: Diagnosis not present

## 2022-03-27 DIAGNOSIS — J209 Acute bronchitis, unspecified: Secondary | ICD-10-CM | POA: Diagnosis not present

## 2022-03-28 ENCOUNTER — Encounter: Payer: Self-pay | Admitting: Cardiovascular Disease

## 2022-03-28 MED ORDER — POTASSIUM CHLORIDE CRYS ER 20 MEQ PO TBCR: EXTENDED_RELEASE_TABLET | ORAL | 1 refills | Status: DC

## 2022-04-17 ENCOUNTER — Ambulatory Visit: Payer: Medicare Other | Attending: Cardiovascular Disease

## 2022-04-17 DIAGNOSIS — I480 Paroxysmal atrial fibrillation: Secondary | ICD-10-CM | POA: Diagnosis not present

## 2022-04-17 DIAGNOSIS — Z5181 Encounter for therapeutic drug level monitoring: Secondary | ICD-10-CM | POA: Diagnosis not present

## 2022-04-17 LAB — POCT INR: INR: 2.1 (ref 2.0–3.0)

## 2022-04-17 NOTE — Patient Instructions (Signed)
continue taking Warfarin 1 tablet daily. Recheck INR in 6 weeks. [facility] [phone]

## 2022-04-22 ENCOUNTER — Other Ambulatory Visit: Payer: Self-pay | Admitting: Adult Health

## 2022-04-22 DIAGNOSIS — F331 Major depressive disorder, recurrent, moderate: Secondary | ICD-10-CM

## 2022-05-15 DIAGNOSIS — M25531 Pain in right wrist: Secondary | ICD-10-CM | POA: Diagnosis not present

## 2022-05-22 ENCOUNTER — Ambulatory Visit
Admission: RE | Admit: 2022-05-22 | Discharge: 2022-05-22 | Disposition: A | Discharge status: Home / Self Care | Payer: Medicare Other | Source: Ambulatory Visit | Attending: Family Medicine | Admitting: Family Medicine

## 2022-05-22 DIAGNOSIS — Z1231 Encounter for screening mammogram for malignant neoplasm of breast: Secondary | ICD-10-CM

## 2022-05-23 ENCOUNTER — Other Ambulatory Visit: Payer: Self-pay | Admitting: Adult Health

## 2022-05-23 DIAGNOSIS — F331 Major depressive disorder, recurrent, moderate: Secondary | ICD-10-CM

## 2022-05-23 NOTE — Telephone Encounter (Signed)
Please call to schedule an appt. Last seen in November with RTC in 4 weeks.

## 2022-05-24 ENCOUNTER — Encounter: Payer: Self-pay | Admitting: Cardiovascular Disease

## 2022-05-24 DIAGNOSIS — I5032 Chronic diastolic (congestive) heart failure: Secondary | ICD-10-CM

## 2022-05-24 DIAGNOSIS — Z8679 Personal history of other diseases of the circulatory system: Secondary | ICD-10-CM | POA: Diagnosis not present

## 2022-05-24 DIAGNOSIS — Z79899 Other long term (current) drug therapy: Secondary | ICD-10-CM

## 2022-05-24 DIAGNOSIS — R059 Cough, unspecified: Secondary | ICD-10-CM | POA: Diagnosis not present

## 2022-05-24 DIAGNOSIS — R0602 Shortness of breath: Secondary | ICD-10-CM | POA: Diagnosis not present

## 2022-05-27 ENCOUNTER — Encounter: Payer: Self-pay | Admitting: Cardiovascular Disease

## 2022-05-28 ENCOUNTER — Telehealth: Payer: Self-pay | Admitting: Adult Health

## 2022-05-28 ENCOUNTER — Encounter: Payer: Self-pay | Admitting: Cardiovascular Disease

## 2022-05-28 MED ORDER — FUROSEMIDE 80 MG PO TABS: ORAL_TABLET | ORAL | 3 refills | Status: DC

## 2022-05-28 NOTE — Telephone Encounter (Signed)
Patient needs an appt.

## 2022-05-28 NOTE — Telephone Encounter (Signed)
Patient called in refill for Pristiq 169m. Sates that she completely out now. She inquired about getting more than a month's prescription. [email or web address]

## 2022-05-29 ENCOUNTER — Ambulatory Visit: Payer: Medicare Other | Attending: Cardiovascular Disease | Admitting: *Deleted

## 2022-05-29 DIAGNOSIS — I480 Paroxysmal atrial fibrillation: Secondary | ICD-10-CM | POA: Diagnosis not present

## 2022-05-29 DIAGNOSIS — Z5181 Encounter for therapeutic drug level monitoring: Secondary | ICD-10-CM

## 2022-05-29 LAB — POCT INR: INR: 1.4 — AB (ref 2.0–3.0)

## 2022-05-29 NOTE — Telephone Encounter (Signed)
Please order a BNP, BMET, dig level and double book on my next DOD day please (unless there is an open acute spot). Thanks.

## 2022-05-29 NOTE — Patient Instructions (Signed)
Description   Today take 1.5 tablets of warfarin then continue taking Warfarin 1 tablet daily. Recheck INR in 1 week (normally 6 weeks). [facility] [phone]

## 2022-05-30 MED ORDER — FUROSEMIDE 80 MG PO TABS: ORAL_TABLET | ORAL | 3 refills | Status: DC

## 2022-05-30 NOTE — Addendum Note (Signed)
Addended by: Fidel Levy on: 05/30/2022 02:26 PM   Modules accepted: Orders

## 2022-05-31 ENCOUNTER — Other Ambulatory Visit: Payer: Self-pay

## 2022-05-31 DIAGNOSIS — I5032 Chronic diastolic (congestive) heart failure: Secondary | ICD-10-CM

## 2022-05-31 DIAGNOSIS — Z79899 Other long term (current) drug therapy: Secondary | ICD-10-CM

## 2022-05-31 NOTE — Telephone Encounter (Signed)
Pt has appt on 3/5

## 2022-06-01 DIAGNOSIS — Z79899 Other long term (current) drug therapy: Secondary | ICD-10-CM | POA: Diagnosis not present

## 2022-06-01 DIAGNOSIS — I5032 Chronic diastolic (congestive) heart failure: Secondary | ICD-10-CM | POA: Diagnosis not present

## 2022-06-04 LAB — BASIC METABOLIC PANEL
BUN/Creatinine Ratio: 21 (ref 12–28)
BUN: 21 mg/dL (ref 8–27)
CO2: 29 mmol/L (ref 20–29)
Calcium: 9.8 mg/dL (ref 8.7–10.3)
Chloride: 100 mmol/L (ref 96–106)
Creatinine, Ser: 1 mg/dL (ref 0.57–1.00)
Glucose: 97 mg/dL (ref 70–99)
Potassium: 4.8 mmol/L (ref 3.5–5.2)
Sodium: 142 mmol/L (ref 134–144)
eGFR: 58 mL/min/{1.73_m2} — ABNORMAL LOW (ref >59)

## 2022-06-04 LAB — BRAIN NATRIURETIC PEPTIDE: BNP: 105.4 pg/mL — ABNORMAL HIGH (ref 0.0–100.0)

## 2022-06-04 LAB — DIGOXIN LEVEL: Digoxin, Serum: 0.8 ng/mL (ref 0.5–0.9)

## 2022-06-05 ENCOUNTER — Ambulatory Visit: Payer: Medicare Other

## 2022-06-06 ENCOUNTER — Ambulatory Visit (INDEPENDENT_AMBULATORY_CARE_PROVIDER_SITE_OTHER): Payer: Medicare Other

## 2022-06-06 ENCOUNTER — Encounter: Payer: Self-pay | Admitting: Cardiovascular Disease

## 2022-06-06 ENCOUNTER — Ambulatory Visit: Payer: Medicare Other | Attending: Cardiovascular Disease | Admitting: Cardiovascular Disease

## 2022-06-06 VITALS — BP 110/72 | HR 80 | Ht 65.0 in | Wt 198.4 lb

## 2022-06-06 DIAGNOSIS — I340 Nonrheumatic mitral (valve) insufficiency: Secondary | ICD-10-CM | POA: Diagnosis not present

## 2022-06-06 DIAGNOSIS — D6869 Other thrombophilia: Secondary | ICD-10-CM | POA: Insufficient documentation

## 2022-06-06 DIAGNOSIS — Z952 Presence of prosthetic heart valve: Secondary | ICD-10-CM | POA: Insufficient documentation

## 2022-06-06 DIAGNOSIS — I5032 Chronic diastolic (congestive) heart failure: Secondary | ICD-10-CM | POA: Insufficient documentation

## 2022-06-06 DIAGNOSIS — R059 Cough, unspecified: Secondary | ICD-10-CM | POA: Diagnosis not present

## 2022-06-06 DIAGNOSIS — Z5181 Encounter for therapeutic drug level monitoring: Secondary | ICD-10-CM | POA: Diagnosis not present

## 2022-06-06 DIAGNOSIS — I4811 Longstanding persistent atrial fibrillation: Secondary | ICD-10-CM | POA: Insufficient documentation

## 2022-06-06 DIAGNOSIS — Z79899 Other long term (current) drug therapy: Secondary | ICD-10-CM | POA: Insufficient documentation

## 2022-06-06 DIAGNOSIS — I480 Paroxysmal atrial fibrillation: Secondary | ICD-10-CM | POA: Insufficient documentation

## 2022-06-06 LAB — POCT INR: INR: 2.3 (ref 2.0–3.0)

## 2022-06-06 NOTE — Patient Instructions (Signed)
Description   Continue taking Warfarin 1 tablet daily.  Recheck INR in 3 weeks (normally 6 weeks).  [facility] [phone]

## 2022-06-06 NOTE — Patient Instructions (Signed)
Medication Instructions:   No changes  *If you need a refill on your cardiac medications before your next appointment, please call your pharmacy*   Lab Work: Not  needed    Testing/Procedures:  Schedule at [facility] chest x-ray takes a picture of the organs and structures inside the chest, including the heart, lungs, and blood vessels. This test can show several things, including, whether the heart is enlarges; whether fluid is building up in the lungs; and whether pacemaker / defibrillator leads are still in place.    Follow-Up: At [facility], you and your health needs are our priority.  As part of our continuing mission to provide you with exceptional heart care, we have created designated Provider Care Teams.  These Care Teams include your primary Cardiologist (physician) and Advanced Practice Providers (APPs -  Physician Assistants and Nurse Practitioners) who all work together to provide you with the care you need, when you need it.     Your next appointment:     keep schedule appointment  The format for your next appointment:   In Person  Provider:   Sanda Klein, MD    Other Instructions

## 2022-06-06 NOTE — Progress Notes (Unsigned)
Cardiology Office Note:    Date:  06/07/2022   ID:  [name], DOB 04-19-1945, MRN [id number]  PCP:  Meredith Smoker, MD  [facility] Cardiologist:  Meredith Klein, MD  [place] Electrophysiologist:  None   Referring MD: [name], *   Chief Complaint  Patient presents with   Cough     History of Present Illness:    [name] is a 77 y.o. 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  She has been troubled by cough for the last 3 months.  She had "the virus that was going around in early December", then complicated by bronchitis which was treated with antibiotics.  Shortly thereafter she developed a sinus infection.  During this whole time she has had an exhausting cough that is productive of sticky light green phlegm.  It is worse in the mornings.  She tried treatment with albuterol, benzonatate, some type of liquid cough suppressant, none of which really helped.  She went to an urgent care clinic and was told that her chest x-ray showed a left pleural effusion (I do not have this report or images available for review).  This was about 3 weeks ago.  She continues to cough.  She does not really have pleuritic pain.  She has not had angina, dizziness, palpitations or syncope.  We checked labs just before this appointment which showed a BNP that is  marginally elevated at 105 (in the past when she had heart failure exacerbation it was in the 500-700 range).  Her digoxin level is not in toxic range.  Routine labs are stable.  Presenting ECG shows atrial fibrillation with good ventricular rate control.  There are a couple of wide complex beats to more likely represent aberrancy but could be PVCs.  She has chronic problems with depression and fatigue.  She has not had PND, orthopnea, lower extremity edema, falls or bleeding problems, focal neurological deficits or major weight changes.  Her most recent INR was 2.3, checked today.  Most recent echo is unchanged with normal LVEF and moderate to severe MR.  Previously poorly tolerant of metoprolol, which seemed to cause depression, so used carvedilol.    She was most recently hospitalized in June 2021 with acute heart failure exacerbation and her echocardiogram showed decreased LVEF to 30-35% as well as moderately reduced right ventricular systolic function and severely elevated pulmonary artery hypertension, despite normal function of the aortic valve prosthesis.  The cause of LVEF decline was uncertain, but she responded well to discontinuation of diltiazem and treatment with diuretics, Entresto, carvedilol (the doses have been limited by low blood pressure).  In the past she reportedly did not tolerate beta-blockers due to depression, but the current low-dose of carvedilol seems to be working well for her. Follow-up echocardiogram performed in October 2021 showed only partial improvement in LVEF now up to 40-45% in the left ventricle remain dilated with an end-systolic diameter of 49 mm.  Mitral regurgitation remains severe with a central jet.  She underwent a TEE on 07/12/2020 that shows further improvement in LVEF which is now almost normal at 50-55%.  It also showed that the mitral regurgitation is central and squarely in the moderate to severe range (there was no reversal of flow in the pulmonary  veins, all calculated parameters suggested higher and of moderate MR).  Anatomically, the valve appears appropriate for MitraClip, without major leaflet retraction and with a mean gradient of 3 mmHg at a heart rate around 100 bpm.  She underwent right and left heart catheterization on 10/14/2020.  This showed excellent hemodynamic values and normal pulmonary artery pressure.  The PAWP was only 10 mmHg and the V wave was not prominent.  It appeared that she did not require treatment for the mitral insufficiency at that point.  Follow-up transthoracic echocardiogram performed 02/07/2021 shows complete normalization of LVEF to 55%, moderate to severe mitral regurgitation, normal parameters of the mechanical aortic valve prosthesis with a mean gradient of 10 mmHg.  Pulmonary function test performed in May 2021 were abnormal with restrictive findings: FEV1 of 39% of predicted, commensurate FVC 40% of predicted.  Her pulmonary specialist is Dr. Ander [name]. She reports compliance with CPAP.    Past Medical History:  Diagnosis Date   Anxiety    Aortic stenosis    status post aortic valve replacement with St. Jude mechanical prosthesis   Ascending aorta dilatation ([place]) 06/25/2016   57m by echo 06/2016 and 412mby CT angin 2016   Chronic anticoagulation    Complication of anesthesia    pt states paralyzed diaphragm after AVR   Depression    DJD (degenerative joint disease) of knee    Dyslipidemia    GERD (gastroesophageal reflux disease)    "several years ago; none since" (11/12/2012)   Hyperlipidemia    Hyperlipidemia LDL goal <70 01/19/2017   Hypertension    Left atrial enlargement    Mitral regurgitation 06/25/2016   Mild moderate MR by echo 06/2016   Obesity    Persistent atrial fibrillation (HCC)    [facility]    On CPAP at 12cm H2O   Subdural hematoma ([facility]   in setting of INR greater than 2.2 shortly after AVR - now cleared by neurosurgery to maintain INR  2-2.5    Past Surgical History:  Procedure Laterality Date   BURR HOLE FOR SUBDURAL HEMATOMA  2006   CARDIAC VALVE REPLACEMENT  2006   St. Jude AVR   CARDIOVERSION N/A 07/22/2012   Procedure: CARDIOVERSION;  Surgeon: MaCandee FurbishMD;  [facility];  Service: Cardiovascular;  Laterality: N/A;   CARDIOVERSION N/A 11/25/2012   Procedure: CARDIOVERSION;  Surgeon: TrSueanne MargaritaMD;  [facility];  Service: Cardiovascular;  Laterality: N/A;   CARDIOVERSION N/A 12/30/2012   Procedure: CARDIOVERSION;  Surgeon: TrSueanne MargaritaMD;  [facility];  Service: Cardiovascular;  Laterality: N/A;  h&p in file-HW    CARDIOVERSION N/A 03/30/2013   Procedure: CARDIOVERSION;  Surgeon: TrSueanne MargaritaMD;  [facility];  Service: Cardiovascular;  Laterality: N/A;   ELECTROPHYSIOLOGIC STUDY  11/2013   [facility] post ablation   KNEE ARTHROSCOPY Left 1980's   "2" (11/12/2012)   KNEE SURGERY Left 1980's   "after 2 scopes they went in and did some kind of OR" (11/12/2012)   RIGHT/LEFT HEART CATH AND CORONARY ANGIOGRAPHY N/A 10/14/2020   Procedure:  RIGHT/LEFT HEART CATH AND CORONARY ANGIOGRAPHY;  Surgeon: Martinique, Peter M, MD;  Location: [place] CV LAB;  Service: Cardiovascular;  Laterality: N/A;   TEE WITHOUT CARDIOVERSION N/A 07/22/2012   Procedure: TRANSESOPHAGEAL ECHOCARDIOGRAM (TEE);  Surgeon: [name], MD;  Location: [facility] ENDOSCOPY;  Service: Cardiovascular;  Laterality: N/A;  Rm 2034   TEE WITHOUT CARDIOVERSION N/A 10/07/2013   Procedure: TRANSESOPHAGEAL ECHOCARDIOGRAM (TEE);  Surgeon: [name], MD;  [facility];  Service: Cardiovascular;  Laterality: N/A;   TEE WITHOUT CARDIOVERSION N/A 07/12/2020   Procedure: TRANSESOPHAGEAL ECHOCARDIOGRAM (TEE);  Surgeon: Meredith Klein, MD;  Location: [place];  Service: Cardiovascular;  Laterality: N/A;   TOTAL KNEE ARTHROPLASTY Left 11/05/2014    Procedure: TOTAL KNEE ARTHROPLASTY;  Surgeon: Dorna Leitz, MD;  Location: [place];  Service: Orthopedics;  Laterality: Left;   TUBAL LIGATION  1980's    Current Medications: Current Meds  Medication Sig   acetaminophen (TYLENOL) 500 MG tablet Take 1,000 mg by mouth every 6 (six) hours as needed (pain).   calcium-vitamin D (OSCAL WITH D) 500-200 MG-UNIT per tablet Take 2 tablets by mouth in the morning.   carvedilol (COREG) 3.125 MG tablet Take 1 tablet (3.125 mg total) by mouth 2 (two) times daily with a meal.   Cholecalciferol (VITAMIN D) 2000 UNITS tablet Take 4,000 Units by mouth in the morning.   desvenlafaxine (PRISTIQ) 100 MG 24 hr tablet TAKE 1 TABLET BY MOUTH EVERY DAY   digoxin (LANOXIN) 0.125 MG tablet Take 1 tablet (0.125 mg total) by mouth every other day.   famotidine (PEPCID) 10 MG tablet Take 10 mg by mouth as needed for heartburn or indigestion.   ferrous sulfate 325 (65 FE) MG tablet Take 325 mg by mouth in the morning.   furosemide (LASIX) 80 MG tablet Take 1 tablet by mouth daily. OKAY TO TAKE EXTRA FOR WEIGHT GAIN> 3LB/ DAILY OR 5LB/WEEKLY   levothyroxine (SYNTHROID, LEVOTHROID) 100 MCG tablet Take 100 mcg daily before breakfast by mouth.   Multiple Vitamin (MULTIVITAMIN WITH MINERALS) TABS Take 1 tablet by mouth in the morning.   oxymetazoline (AFRIN) 0.05 % nasal spray Place 1 spray into both nostrils daily as needed for congestion.   potassium chloride SA (KLOR-CON M) 20 MEQ tablet TAKE 2 TABLETS(40 MEQ) BY MOUTH DAILY   rosuvastatin (CRESTOR) 20 MG tablet Take 1 tablet (20 mg total) by mouth daily.   sacubitril-valsartan (ENTRESTO) 97-103 MG Take 1 tablet by mouth 2 (two) times daily.   traZODone (DESYREL) 50 MG tablet Take 50-100 mg by mouth at bedtime as needed.   tretinoin (RETIN-A) 0.05 % cream Apply 1 application topically at bedtime. Applied to face   warfarin (COUMADIN) 4 MG tablet TAKE 1 TABLET BY MOUTH DAILY AS DIRECTED BY COUMADIN CLINIC     Allergies:    Codeine, Dilantin [phenytoin sodium extended], Metoprolol, and Propoxyphene   Social History   Socioeconomic History   Marital status: Married    Spouse name: Not on file   Number of children: Not on file   Years of education: Not on file   Highest education level: Not on file  Occupational History   Not on file  Tobacco Use   Smoking status: Never   Smokeless tobacco: Never   Tobacco comments:    Never smoke 06/22/21  Substance and Sexual Activity   Alcohol use: No    Comment: 11/12/2012 "no alcohol in the last 9 years"   Drug use: No   Sexual activity: Not Currently  Other Topics Concern   Not on file  Social History Narrative   Pt lives in [place] with spouse.  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 [employer] Insecurity: Not on file  Transportation Needs: Not on file  Physical Activity: Inactive (09/30/2019)   Exercise Vital Sign    Days of Exercise per Week: 0 days    Minutes of Exercise per Session: 0 min  Stress: Not on file  Social Connections: Unknown (09/30/2019)   Social Connection and Isolation Panel [NHANES]    Frequency of Communication with Friends and Family: Once a week    Frequency of Social Gatherings with Friends and Family: Not on file    Attends Religious Services: 1 to 4 times per year    Active Member of [employer] or Organizations: No    Attends Music therapist: Never    Marital Status: Married     Family History: The patient's family history includes Heart disease in her brother; Hyperlipidemia in her father; Hypertension in her father and mother; Lung cancer in her mother.  ROS:   Please see the history of present illness.     All other systems reviewed and are negative.  EKGs/Labs/Other Studies Reviewed:    The following studies were reviewed today: TTE 01/20/20 1. Systolic Index of contractility has improved from 669 to 865 mmHg/s.Marland Kitchen  Left ventricular ejection fraction, by estimation,  is 35 to 40%. Left  ventricular ejection fraction by PLAX is 40 %. The left ventricle has  moderately decreased function. The left  ventricle demonstrates global hypokinesis. The left ventricular internal  cavity size was mildly to moderately dilated. There is mild concentric  left ventricular hypertrophy. Left ventricular diastolic parameters are  indeterminate.   2. Right ventricular systolic function is moderately reduced. The right  ventricular size is moderately enlarged. There is moderately elevated  pulmonary artery systolic pressure. The estimated right ventricular  systolic pressure is [id number] mmHg.   3. Left atrial size was severely dilated.   4. Right atrial size was severely dilated.   5. Central mitral regurgitation; suspect atrial dilation contributes to  mechanism . The mitral valve is grossly normal. Severe mitral valve  regurgitation.   6. Prosthetic DVI 0.33, AT < 100 ms. The aortic valve was not well  visualized. Aortic valve regurgitation is not visualized. There is a 21 mm  St. Jude St. Jude Reagent mechanical valve present in the aortic position.  Procedure Date: 2006.   7. Aortic dilatation noted. There is mild dilatation of the aortic root,  measuring 40 mm. There is moderate dilatation of the ascending aorta,  measuring 47 mm.   8. The inferior vena cava is normal in size with <50% respiratory  variability, suggesting right atrial pressure of 8 mmHg.   TEE 07/12/2020  1. Left ventricular ejection fraction, by estimation, is 50 to 55%. The  left ventricle has low normal function. The left ventricle demonstrates  global hypokinesis. Left ventricular diastolic function could not be  evaluated.   2. Left atrial size was severely dilated. No left atrial/left atrial  appendage thrombus was detected. The LAA emptying velocity was 50 cm/s.  (the rhythm was atrial flutter).   3. Right atrial size was severely dilated.   4. The mitral valve is rheumatic. Moderate to  severe mitral valve  regurgitation. No evidence of mitral stenosis. The mean mitral valve  gradient is 3.0 mmHg.   5. Tricuspid valve regurgitation is  moderate.   6. The aortic valve has been repaired/replaced. Aortic valve  regurgitation is trivial. No aortic stenosis is present. There is a 19 mm  mechanical valve present in the aortic position. Echo findings are  consistent with normal structure and function of  the aortic valve prosthesis. Aortic valve mean gradient measures 3.0 mmHg.  Aortic valve Vmax measures 1.09 m/s.   7. Aortic dilatation noted. There is mild dilatation of the ascending  aorta, measuring 40 mm. There is Moderate (Grade III) atheroma plaque  involving the descending aorta.   8. Evidence of atrial level shunting detected by color flow Doppler.  There is a small patent foramen ovale with predominantly left to right  shunting across the atrial septum.   9. Right ventricular systolic function is normal. The right ventricular  size is normal. There is mildly elevated pulmonary artery systolic  pressure.   Echo 02/07/2021 1. Left ventricular ejection fraction, by estimation, is 55%. The left  ventricle has normal function. The left ventricle has no regional wall  motion abnormalities. Left ventricular diastolic parameters are consistent  with Grade II diastolic dysfunction  (pseudonormalization).   2. Mildly D-shaped septum consistent with RV pressure/volume overload.  Right ventricular systolic function is moderately reduced. The right  ventricular size is mildly enlarged. There is normal pulmonary artery  systolic pressure. The estimated right  ventricular systolic pressure is 123XX123 mmHg.   3. Left atrial size was moderately dilated.   4. Right atrial size was severely dilated.   5. The mitral valve is abnormal with thickening and calcification, the  posterior leaflet is restricted. Possible rheumatic valve disease. There  is moderate to severe mitral valve  regurgitation, PISA does not appear  accurate. No evidence of mitral  stenosis. Moderate mitral annular calcification.   6. St Jude mechanical aortic valve. Trivial regurgitation. Mean gradient  10 mmHg. Normal function.   7. Aortic dilatation noted. There is moderate dilatation of the ascending  aorta, measuring 45 mm.   8. The inferior vena cava is normal in size with greater than 50%  respiratory variability, suggesting right atrial pressure of 3 mmHg.   Comparison(s): 08/15/20 EF 35-40%. PA pressure 84mHg. AV 175mg mean PG,  3338m peak PG. Moderate-severe MR.  Cardiac cath 10/12/2020 1. Normal coronary anatomy 2. Borderline pulmonary HTN. Mean PAP 22 mmHg 3. Normal PCWP mean 10 mm Hg. V wave does not appear prominent 4. Normal cardiac output.   Plan: resume anticoagulation. Continue medical therapy.  Fick Cardiac Output 8.73 L/min  Fick Cardiac Output Index 4.18 (L/min)/BSA  RA A Wave 5 mmHg  RA V Wave 6 mmHg  RA Mean 3 mmHg  RV Systolic Pressure 42 mmHg  RV Diastolic Pressure 0 mmHg  RV EDP 5 mmHg  PA Systolic Pressure 41 mmHg  PA Diastolic Pressure 11 mmHg  PA Mean 22 mmHg  PW A Wave 12 mmHg  PW V Wave 17 mmHg  PW Mean 10 mmHg  AO Systolic Pressure 11199991111Hg  AO Diastolic Pressure 49 mmHg  AO Mean 76 mmHg  QP/QS 1  TPVR Index 5.26 HRUI  TSVR Index 18.19 HRUI  PVR SVR Ratio 0.16  TPVR/TSVR Ratio 0.29    ECHO 02/13/2022    1. Left ventricular ejection fraction, by estimation, is 55 to 60%. The  left ventricle has normal function. The left ventricle has no regional  wall motion abnormalities. Left ventricular diastolic parameters are  indeterminate.   2. Right ventricular systolic function is mildly reduced. The  right  ventricular size is mildly enlarged.   3. Left atrial size was severely dilated.   4. Right atrial size was severely dilated.   5. The mitral valve is degenerative. Moderate to severe mitral valve  regurgitation. No evidence of mitral  stenosis.   6. Tricuspid valve regurgitation is mild to moderate.   7. The aortic valve has been repaired/replaced. Aortic valve  regurgitation is not visualized. No aortic stenosis is present.   8. There is mild dilatation of the aortic root, measuring 37 mm. There is  moderate dilatation of the ascending aorta, measuring 42 mm.   9. The inferior vena cava is normal in size with greater than 50%  respiratory variability, suggesting right atrial pressure of 3 mmHg.    EKG:  EKG is not ordered today and shows atrial fibrillation with a ventricular rate of 80 bpm.  There were 2 wide-complex beats that most likely represent aberrancy, could be PVCs.  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  T wave inversion in the lateral leads is consistent with digoxin effect.  Borderline prolonged QTc at 498 ms. Recent Labs: 09/12/2021: TSH 2.910 06/01/2022: BNP 105.4; BUN 21; Creatinine, Ser 1.00; Potassium 4.8; Sodium 142  12/27/2020 Hemoglobin 12.0, creatinine 0.71, potassium 4.1, ALT 16, TSH 1.54 Recent Lipid Panel    Component Value Date/Time   CHOL 170 05/24/2021 1054   TRIG 94 05/24/2021 1054   HDL 64 05/24/2021 1054   CHOLHDL 2.7 05/24/2021 1054   LDLCALC 89 05/24/2021 1054   01/05/2021 cholesterol 205, HDL 54, LDL 126, triglycerides 136.  Physical Exam:    VS:  BP 110/72 (BP Location: Left Arm, Patient Position: Sitting, Cuff Size: Large)   Pulse 80   Ht '5\' 5"'$  (1.651 m)   Wt 198 lb 6.4 oz (90 kg)   SpO2 94%   BMI 33.02 kg/m     Wt Readings from Last 3 Encounters:  06/06/22 198 lb 6.4 oz (90 kg)  02/15/22 206 lb 12.8 oz (93.8 kg)  10/18/21 212 lb 3.2 oz (96.3 kg)     General: Alert, oriented x3, no distress, mildly obese.  Note that she did not cough during the office visit today. Head: no evidence of trauma, PERRL, EOMI, no exophtalmos or lid lag, no myxedema, no xanthelasma; normal ears, nose and oropharynx Neck: normal jugular venous pulsations and no hepatojugular reflux; brisk carotid pulses  without delay and no carotid bruits Chest: clear to auscultation, no signs of consolidation by percussion or palpation, normal fremitus, symmetrical and full respiratory excursions Cardiovascular: normal position and quality of the apical impulse, ir regular rhythm, normal first and widely split second heart sounds, [id number] holosystolic murmur at the apex, no diastolic murmurs, rubs or gallops Abdomen: no tenderness or distention, no masses by palpation, no abnormal pulsatility or arterial bruits, normal bowel sounds, no hepatosplenomegaly Extremities: no clubbing, cyanosis or edema; 2+ radial, ulnar and brachial pulses bilaterally; 2+ right femoral, posterior tibial and dorsalis pedis pulses; 2+ left femoral, posterior tibial and dorsalis pedis pulses; no subclavian or femoral bruits Neurological: grossly nonfocal Psych: Normal mood and affect   ASSESSMENT:    1. Chronic diastolic CHF (congestive heart failure) ([place])   2. Cough, unspecified type   3. Longstanding persistent atrial fibrillation ([place])   4. Nonrheumatic mitral valve regurgitation   5. Encounter for monitoring digoxin therapy   6. H/O mechanical aortic valve replacement   7. Acquired thrombophilia ([place])          PLAN:  In order of problems listed above:  CHF: There are no clinical findings to suggest heart failure exacerbation/hypervolemia.  Her BNP is also essentially normal.  Her cough is not positional and it is productive of mucoid sputum suggesting that it is due to bronchitis.  She weighed substantially less than she did in the past when we also considered her to be euvolemic.   LVEF has returned to normal, suggesting that the mechanism may indeed have been tachycardia related, not related to mitral valve disease.  She does not have any signs or symptoms of coronary disease or any perfusion abnormalities on the nuclear stress test from May 2021.   Cough: Sounds postinfectious.  Will repeat her chest x-ray since there  was a report of pleural effusion.  Refer back to her pulmonary specialist if we find any abnormalities. MR: As before, the murmur of mitral regurgitation is quite unimpressive on exam.  It looks worse on echocardiography.  At cardiac catheterization LV filling pressures were very low.  Anatomically, she does have appropriate findings for MitraClip should this become necessary in the future.  Continue yearly follow-up clinically and with echo to screen for signs of LV dilation or decrease in EF. AFib: Adequate rate control.  On warfarin anticoagulation.  Now a permanent arrhythmia.  She has had 2 previous ablation procedures and has failed both amiodarone and dofetilide. Often has a relatively regular rhythm suggesting a competing junctional pacemaker.  Fully anticoagulated for mechanical valve.   Digoxin: She did not do well with higher doses or other beta-blockers for rate control and seems to feel better on digoxin. Mech AVR: Normal function.  Normal gradients across the mechanical prosthesis (mean gradient 10 mmHg for size 19 St. Jude [employer]ent dual leaflet).  Recheck echocardiogram in November. Anticoagulation: No recent bleeding issues and no falls and current INR is in therapeutic range..  Had some very broad swings in INR last month, the only culprit I can identify the fact that she was using antibiotic eyedrops for several days.  She has stopped these and she is now back in therapeutic range with her anticoagulation.  She does have a history of serious bleeding complications on 2 occasions (subdural hematoma 2006 and rectus sheath tumor 2019). OSA: She is compliant with CPAP and does not have daytime hypersomnolence Restrictive lung disease: PFTs from 2018 through 2021 showed reductions in FVC of 40-56% of predicted.  This may be a significant part of her chronic dyspnea.  Fairly significant reduction in FVC, at least in part attributable to obesity.  Also has moderate thoracic dextroscoliosis.   Follow-up with Dr. Ander [name]. Asc Ao dilation: Stable in size by CT and by recnt echo. CT scan in 2017 documented an ascending aorta diameter 4.3 cm, CT that we performed on 07/13/2021 shows exactly the same measurement of 4.3 cm, echo Nov 2023 4.2 cm. Aortic atherosclerosis: Mild atherosclerosis of the thoracic aorta on the most recent CT April 2023, and abdominal aortic atherosclerosis is described on CT from 2019.   HLP: She is on statin.  Target LDL <100 appears sufficient. She does have aortic atherosclerosis but no coronary disease, history of stroke, angina or revascularization procedures.   Depression: has been trying new therapies, but not reached a good spot yet. Will be trying brexpiprazole. No interaction with her meds.  Medication Adjustments/Labs and Tests Ordered: Current medicines are reviewed at length with the patient today.  Concerns regarding medicines are outlined above.  Orders Placed This Encounter  Procedures   DG  Chest 2 View   EKG 12-Lead     No orders of the defined types were placed in this encounter.    Patient Instructions  Medication Instructions:   No changes  *If you need a refill on your cardiac medications before your next appointment, please call your pharmacy*   Lab Work: Not  needed    Testing/Procedures:  Schedule at [place] chest x-ray takes a picture of the organs and structures inside the chest, including the heart, lungs, and blood vessels. This test can show several things, including, whether the heart is enlarges; whether fluid is building up in the lungs; and whether pacemaker / defibrillator leads are still in place.    Follow-Up: At [facility], you and your health needs are our priority.  As part of our continuing mission to provide you with exceptional heart care, we have created designated Provider Care Teams.  These Care Teams include your primary Cardiologist (physician) and Advanced  Practice Providers (APPs -  Physician Assistants and Nurse Practitioners) who all work together to provide you with the care you need, when you need it.     Your next appointment:     keep schedule appointment  The format for your next appointment:   In Person  Provider:   Sanda Klein, MD    Other Instructions    Signed, Meredith Klein, MD  06/07/2022 2:57 PM    [place]

## 2022-06-07 ENCOUNTER — Ambulatory Visit
Admission: RE | Admit: 2022-06-07 | Discharge: 2022-06-07 | Disposition: A | Discharge status: Home / Self Care | Payer: Medicare Other | Source: Ambulatory Visit | Attending: Cardiovascular Disease | Admitting: Cardiovascular Disease

## 2022-06-07 ENCOUNTER — Encounter: Payer: Self-pay | Admitting: Cardiovascular Disease

## 2022-06-07 DIAGNOSIS — R059 Cough, unspecified: Secondary | ICD-10-CM

## 2022-06-08 ENCOUNTER — Encounter: Payer: Self-pay | Admitting: Cardiovascular Disease

## 2022-06-08 NOTE — Telephone Encounter (Signed)
It's very strange.  The chest x-ray report did not come to my in basket even though I ordered it.

## 2022-06-08 NOTE — Telephone Encounter (Signed)
The study has not been officially read, but I have reviewed it and I think it looks very good.  I will send her a message.

## 2022-06-11 ENCOUNTER — Other Ambulatory Visit: Payer: Self-pay | Admitting: Cardiology

## 2022-06-11 DIAGNOSIS — I480 Paroxysmal atrial fibrillation: Secondary | ICD-10-CM

## 2022-06-11 NOTE — Telephone Encounter (Signed)
Please review for refill. Thank you! 

## 2022-06-12 ENCOUNTER — Other Ambulatory Visit: Payer: Self-pay | Admitting: Cardiovascular Disease

## 2022-06-12 ENCOUNTER — Ambulatory Visit: Payer: Medicare Other | Admitting: Adult Health

## 2022-06-23 ENCOUNTER — Other Ambulatory Visit: Payer: Self-pay | Admitting: Adult Health

## 2022-06-23 DIAGNOSIS — F331 Major depressive disorder, recurrent, moderate: Secondary | ICD-10-CM

## 2022-06-26 ENCOUNTER — Ambulatory Visit: Payer: Medicare Other | Attending: Cardiovascular Disease | Admitting: *Deleted

## 2022-06-26 DIAGNOSIS — I480 Paroxysmal atrial fibrillation: Secondary | ICD-10-CM

## 2022-06-26 DIAGNOSIS — Z5181 Encounter for therapeutic drug level monitoring: Secondary | ICD-10-CM

## 2022-06-26 LAB — POCT INR: INR: 2.2 (ref 2.0–3.0)

## 2022-06-26 NOTE — Patient Instructions (Signed)
Description   Continue taking Warfarin 1 tablet daily. Recheck INR in 5 weeks.  Coumadin Clinic [phone]     

## 2022-06-29 ENCOUNTER — Encounter: Payer: Self-pay | Admitting: Adult Health

## 2022-06-29 ENCOUNTER — Ambulatory Visit (INDEPENDENT_AMBULATORY_CARE_PROVIDER_SITE_OTHER): Payer: Medicare Other | Admitting: Pulmonary Disease

## 2022-06-29 ENCOUNTER — Ambulatory Visit (INDEPENDENT_AMBULATORY_CARE_PROVIDER_SITE_OTHER): Payer: Medicare Other | Admitting: Adult Health

## 2022-06-29 ENCOUNTER — Encounter: Payer: Self-pay | Admitting: Pulmonary Disease

## 2022-06-29 ENCOUNTER — Telehealth: Payer: Self-pay | Admitting: Adult Health

## 2022-06-29 VITALS — BP 110/74 | HR 62 | Ht 64.0 in | Wt 199.2 lb

## 2022-06-29 DIAGNOSIS — F331 Major depressive disorder, recurrent, moderate: Secondary | ICD-10-CM

## 2022-06-29 DIAGNOSIS — G4733 Obstructive sleep apnea (adult) (pediatric): Secondary | ICD-10-CM

## 2022-06-29 DIAGNOSIS — R053 Chronic cough: Secondary | ICD-10-CM | POA: Diagnosis not present

## 2022-06-29 MED ORDER — SERTRALINE HCL 50 MG PO TABS: 50.0000 mg | ORAL_TABLET | Freq: Every day | ORAL | 2 refills | Status: DC

## 2022-06-29 NOTE — Progress Notes (Signed)
[name]    [id number]    08-Aug-1945  Primary Care Physician:Patient, No Pcp Per  Referring Physician: Glenis Smoker, MD [place],  [place] [id number]  Chief complaint:   Follow-up for obstructive sleep apnea  HPI:  Sleep apnea diagnosed in 2015, currently on CPAP of 17 Tolerating CPAP well CPAP was decreased to CPAP  15 from 17 Has had a period where she did not use CPAP regularly because she was having nasal stuffiness and congestion, bronchitis  Did not use CPAP for a while but soon after starting to use CPAP again she is starting to feel nasal stuffiness and congestion again  She has been trying to keep her CPAP clean  She was benefiting and very compliant with CPAP use Last seen in the office about a year ago  Compliance does reflect the times that she has not been using CPAP recently from having respiratory infection She still does have some cough and congestion  She still has a cough, Tessalon Perles does not help the cough She uses Zyrtec for allergies  She feels her CPAP machine may be old now as well, she is unclear how long she has had the machine  Does have a history of diastolic heart failure  She is exercising regularly and has managed to lose about 30 pounds since her last visit  history of atrial fibrillation History of pulmonary hypertension History of asthma  History of aortic valve disease for which he had repair in the past-has a mechanical aortic valve  She does try to exercise with an elliptical device for about 30 minutes on a regular basis, this is done in a seated position -Has been having some issues with the atrial fibrillation with a heart rate a little higher -Continues on Coreg -She continues to follow-up with cardiology  Outpatient Encounter Medications as of 06/29/2022  Medication Sig   acetaminophen (TYLENOL) 500 MG tablet Take 1,000 mg by mouth every 6 (six) hours as needed (pain).    calcium-vitamin D (OSCAL WITH D) 500-200 MG-UNIT per tablet Take 2 tablets by mouth in the morning.   carvedilol (COREG) 3.125 MG tablet TAKE 1 TABLET BY MOUTH TWICE A DAY WITH MEALS   Cholecalciferol (VITAMIN D) 2000 UNITS tablet Take 4,000 Units by mouth in the morning.   desvenlafaxine (PRISTIQ) 100 MG 24 hr tablet TAKE 1 TABLET BY MOUTH EVERY DAY   digoxin (LANOXIN) 0.125 MG tablet Take 1 tablet (0.125 mg total) by mouth every other day.   famotidine (PEPCID) 10 MG tablet Take 10 mg by mouth as needed for heartburn or indigestion.   ferrous sulfate 325 (65 FE) MG tablet Take 325 mg by mouth in the morning.   furosemide (LASIX) 80 MG tablet Take 1 tablet by mouth daily. OKAY TO TAKE EXTRA FOR WEIGHT GAIN> 3LB/ DAILY OR 5LB/WEEKLY   levothyroxine (SYNTHROID, LEVOTHROID) 100 MCG tablet Take 100 mcg daily before breakfast by mouth.   Multiple Vitamin (MULTIVITAMIN WITH MINERALS) TABS Take 1 tablet by mouth in the morning.   oxymetazoline (AFRIN) 0.05 % nasal spray Place 1 spray into both nostrils daily as needed for congestion.   potassium chloride SA (KLOR-CON M) 20 MEQ tablet TAKE 2 TABLETS(40 MEQ) BY MOUTH DAILY   rosuvastatin (CRESTOR) 20 MG tablet Take 1 tablet (20 mg total) by mouth daily.   sacubitril-valsartan (ENTRESTO) 97-103 MG Take 1 tablet by mouth 2 (two) times daily.   sertraline (ZOLOFT)  50 MG tablet Take 1 tablet (50 mg total) by mouth daily.   traZODone (DESYREL) 50 MG tablet Take 50-100 mg by mouth at bedtime as needed.   tretinoin (RETIN-A) 0.05 % cream Apply 1 application topically at bedtime. Applied to face   warfarin (COUMADIN) 4 MG tablet TAKE 1 TABLET BY MOUTH EVERY DAY AS DIRECTED BY COUMADIN CLINIC   No facility-administered encounter medications on file as of 06/29/2022.    Allergies as of 06/29/2022 - Review Complete 06/29/2022  Allergen Reaction Noted   Codeine Anaphylaxis 07/17/2012   Dilantin [phenytoin sodium extended] Rash and Itching 07/17/2012    Metoprolol Other (See Comments) 10/23/2013   Propoxyphene Other (See Comments) 10/23/2013    Past Medical History:  Diagnosis Date   Anxiety    Aortic stenosis    status post aortic valve replacement with St. 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   Depression    DJD (degenerative joint disease) of knee    Dyslipidemia    GERD (gastroesophageal reflux disease)    "several years ago; none since" (11/12/2012)   Hyperlipidemia    Hyperlipidemia LDL goal <70 01/19/2017   Hypertension    Left atrial enlargement    Mitral regurgitation 06/25/2016   Mild moderate MR by echo 06/2016   Obesity    Persistent atrial fibrillation (HCC)    s/p afib ablation x 2 at [facility]   Sleep apnea    On CPAP at 12cm H2O   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 Jude AVR   CARDIOVERSION N/A 07/22/2012   Procedure: CARDIOVERSION;  Surgeon: [name], MD;  Location: [facility] ENDOSCOPY;  Service: Cardiovascular;  Laterality: N/A;   CARDIOVERSION N/A 11/25/2012   Procedure: CARDIOVERSION;  Surgeon: [name], MD;  Location: [place] ENDOSCOPY;  Service: Cardiovascular;  Laterality: N/A;   CARDIOVERSION N/A 12/30/2012   Procedure: CARDIOVERSION;  Surgeon: [name], MD;  Location: [place] ENDOSCOPY;  Service: Cardiovascular;  Laterality: N/A;  h&p in file-HW    CARDIOVERSION N/A 03/30/2013   Procedure: CARDIOVERSION;  Surgeon: [name], MD;  Location: [place] ENDOSCOPY;  Service: Cardiovascular;  Laterality: N/A;   ELECTROPHYSIOLOGIC STUDY  11/2013   Afib ablation x 2 (11/2013 and 10/2015) at [facility] by Dr Clyda Hurdle with  recurrence post ablation   KNEE ARTHROSCOPY Left 1980's   "2" (11/12/2012)   KNEE SURGERY Left 1980's   "after 2 scopes they went in and did some kind of OR" (11/12/2012)   RIGHT/LEFT HEART CATH AND CORONARY ANGIOGRAPHY N/A 10/14/2020   Procedure: RIGHT/LEFT HEART CATH AND CORONARY ANGIOGRAPHY;  Surgeon: Martinique, Peter M, MD;  Location: [place] CV LAB;  Service: Cardiovascular;  Laterality: N/A;   TEE WITHOUT CARDIOVERSION N/A 07/22/2012   Procedure: TRANSESOPHAGEAL ECHOCARDIOGRAM (TEE);  Surgeon: [name], MD;  Location: [facility] ENDOSCOPY;  Service: Cardiovascular;  Laterality: N/A;  Rm 2034   TEE WITHOUT CARDIOVERSION N/A 10/07/2013   Procedure: TRANSESOPHAGEAL ECHOCARDIOGRAM (TEE);  Surgeon: [name], MD;  Location: [place];  Service: Cardiovascular;  Laterality: N/A;  TEE WITHOUT CARDIOVERSION N/A 07/12/2020   Procedure: TRANSESOPHAGEAL ECHOCARDIOGRAM (TEE);  Surgeon: Sanda Klein, MD;  Location: [place];  Service: Cardiovascular;  Laterality: N/A;   TOTAL KNEE ARTHROPLASTY Left 11/05/2014   Procedure: TOTAL KNEE ARTHROPLASTY;  Surgeon: Dorna Leitz, MD;  Location: [place];  Service: Orthopedics;  Laterality: Left;   TUBAL LIGATION  62's    Family History  Problem Relation Age of Onset   Lung cancer Mother    Hypertension Mother    Hyperlipidemia Father    Hypertension Father    Heart disease Brother     Social History   Socioeconomic History   Marital status: Married    Spouse name: Not on file   Number of children: Not on file   Years of education: Not on file   Highest education level: Not on file  Occupational History   Not on file  Tobacco Use   Smoking status: Never   Smokeless tobacco: Never   Tobacco comments:    Never smoke 06/22/21  Substance and Sexual Activity   Alcohol use: No    Comment: 11/12/2012 "no alcohol in the last 9 years"   Drug use: No   Sexual activity: Not Currently  Other Topics Concern   Not on file  Social History Narrative   Pt  lives in [place] with spouse.  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 [employer] Insecurity: Not on file  Transportation Needs: Not on file  Physical Activity: Inactive (09/30/2019)   Exercise Vital Sign    Days of Exercise per Week: 0 days    Minutes of Exercise per Session: 0 min  Stress: Not on file  Social Connections: Unknown (09/30/2019)   Social Connection and Isolation Panel [NHANES]    Frequency of Communication with Friends and Family: Once a week    Frequency of Social Gatherings with Friends and Family: Not on file    Attends Religious Services: 1 to 4 times per year    Active Member of [employer] or Organizations: No    Attends Archivist Meetings: Never    Marital Status: Married  Human resources officer Violence: Not on file    Review of Systems  Constitutional:  Positive for fatigue. Negative for fever.  Respiratory:  Positive for apnea, cough and shortness of breath.   Cardiovascular:  Negative for chest pain.  Psychiatric/Behavioral:  Positive for sleep disturbance.    Vitals:   06/29/22 1126  BP: 110/74  Pulse: 62  SpO2: 97%   Physical Exam Constitutional:      Appearance: She is obese.  HENT:     Head: Normocephalic.     Mouth/Throat:     Mouth: Mucous membranes are moist.  Eyes:     General:        Right eye: No discharge.        Left eye: No discharge.  Cardiovascular:     Rate and Rhythm: Normal rate and regular rhythm.     Pulses: Normal pulses.     Heart sounds: Normal heart sounds. No murmur heard.    No friction rub.  Pulmonary:     Effort: Pulmonary effort is normal. No respiratory distress.     Breath sounds: No stridor. No wheezing, rhonchi or rales.  Musculoskeletal:     Cervical back: No rigidity or tenderness.  Neurological:     Mental Status: She is alert.  Psychiatric:        Mood and  Affect: Mood normal.   Data Reviewed: PFT reviewed showing combined obstruction and  restriction -Reviewed with the patient in the office today from 2021 and from 2018 -PFT from 2018 did reveal significant bronchodilator response  Previous sleep study significant for severe obstructive sleep apnea Echocardiogram significant for ejection fraction of 30 to 35% with moderately reduced right ventricular systolic function, severe pulmonary hypertension Recent sleep study reviewed showing severe obstructive sleep apnea, CPAP of 17  CPAP compliance Most recent CPAP compliance shows 74% compliance Average use of 6 hours 59 minutes CPAP setting of 15 AHI 1.1  assessment:  Severe obstructive sleep apnea -Continue CPAP -Residual AHI still remains low  Patient feels machine is aged  Chronic cough -Breo was prescribed -Not currently on Breo at present  Restrictive lung disease Combined with obstruction with significant bronchodilator response -Noted on recent PFT  Class I obesity -Continue weight loss efforts  Heart failure with reduced ejection fraction Diastolic heart failure Paroxysmal atrial fibrillation Pulmonary hypertension -Continue current management  Restrictive lung disease  Aortic valvulopathy Mitral valvulopathy  Deconditioning -This is better, more active -Continues to try to stay active  Persistent cough likely related to airway hyperactivity/hyperresponsiveness  Plan/Recommendations: Encouraged to continue CPAP use on a nightly basis Continue pressure of 15  Prescription to be sent to DME company for new machine  Encourage graded activities  For nasal stuffiness and congestion -Encouraged to start a nasal steroid  Encouraged to consider trying N-acetylcysteine which may help with mucus clearance  Will follow-up a year from now  Encouraged to give [place] a call with any significant concerns     Sherrilyn Rist MD [facility] 06/29/2022, 11:36 AM  CC: Glenis Smoker, *

## 2022-06-29 NOTE — Telephone Encounter (Signed)
Called and spoke with patient.

## 2022-06-29 NOTE — Addendum Note (Signed)
Addended by: June Leap on: 06/29/2022 01:33 PM   Modules accepted: Orders

## 2022-06-29 NOTE — Patient Instructions (Signed)
I will see you about a year from now  Continue with your CPAP  New CPAP, CPAP setting of 15 with an EPR of 3 to go to DME supply company[employer]   You can try Flonase/Nasonex-for nasal stuffiness  N-acetylcysteine or NAC-over-the-counter medication that may help secretion clearance, it is twice a day  I will see you back in about a year  Call us with significant concerns

## 2022-06-29 NOTE — Progress Notes (Addendum)
[name] [id number] November 30, 1945 77 y.o.  Subjective:   Patient ID:  [name] is a 77 y.o. (DOB [birth year]) female.  Chief Complaint: No chief complaint on file.   HPI [name] presents to the office today for follow-up of MDD.  Describes mood today as "better". Pleasant. Decreased tearfulness. Mood symptoms - reports increased depression - "sad and lethargic". Stating "my motivation is low". Denies anxiety and irritability. Reports worry, rumination, and over thinking. Mood has lower. Stating "I've been more down". Does not feel like the Pristiq is as helpful as it was initially. Willing to consider other options. Improved interest and motivation. Taking medications as prescribed.  Energy levels lower. Active, has not been walking on the tread mill for the past 2 weeks. Enjoys some usual interests and activities. Married. Lives with husband and 2 [place] terriers. Has a son, age 58. Has one step daughter - 30. Spending time with family. Attends church.  Appetite adequate. Weight loss - 20 pounds over the past year. Sleeps well most nights. Averages 7 to 8.5 hours. Napping in the afternoon some days. Focus and concentration stable. Completing tasks. Managing aspects of household. Retired - [employer]. Denies SI or HI.  Denies AH or VH. Denies self harm. Denies substance use.  Previous medication trials:  Xanax, Zoloft, Wellbutrin, Valium, Ambien, Lexapro   Flowsheet Row ED from 04/13/2021 in [facility] Department at [facility] Admission (Discharged) from 10/14/2020 in [place] CATH LAB ED to Hosp-Admission (Discharged) from 08/14/2020 in [place] HF PCU  C-SSRS RISK CATEGORY No Risk No Risk No Risk        Review of Systems:  Review of Systems  Musculoskeletal:  Negative for gait problem.  Neurological:  Negative for tremors.  Psychiatric/Behavioral:         Please refer to HPI    Medications:  I have reviewed the patient's current medications.  Current Outpatient Medications  Medication Sig Dispense Refill   sertraline (ZOLOFT) 50 MG tablet Take 1 tablet (50 mg total) by mouth daily. 30 tablet 2   acetaminophen (TYLENOL) 500 MG tablet Take 1,000 mg by mouth every 6 (six) hours as needed (pain).     calcium-vitamin D (OSCAL WITH D) 500-200 MG-UNIT per tablet Take 2 tablets by mouth in the morning.     carvedilol (COREG) 3.125 MG tablet TAKE 1 TABLET BY MOUTH TWICE A DAY WITH MEALS 180 tablet 1   Cholecalciferol (VITAMIN D) 2000 UNITS tablet Take 4,000 Units by mouth in the morning.     desvenlafaxine (PRISTIQ) 100 MG 24 hr tablet TAKE 1 TABLET BY MOUTH EVERY DAY 30 tablet 0   digoxin (LANOXIN) 0.125 MG tablet Take 1 tablet (0.125 mg total) by mouth every other day. 45 tablet 3   famotidine (PEPCID) 10 MG tablet Take 10 mg by mouth as needed for heartburn or indigestion.     ferrous sulfate 325 (65 FE) MG tablet Take 325 mg by mouth in the morning.     furosemide (LASIX) 80 MG tablet Take 1 tablet by mouth daily. OKAY TO TAKE EXTRA FOR WEIGHT GAIN> 3LB/ DAILY OR 5LB/WEEKLY 180 tablet 3   levothyroxine (SYNTHROID, LEVOTHROID) 100 MCG tablet Take 100 mcg daily before breakfast by mouth.     Multiple Vitamin (MULTIVITAMIN WITH MINERALS) TABS Take 1 tablet by mouth in the morning.     oxymetazoline (AFRIN) 0.05 % nasal spray Place 1 spray into both nostrils daily as  needed for congestion.     potassium chloride SA (KLOR-CON M) 20 MEQ tablet TAKE 2 TABLETS(40 MEQ) BY MOUTH DAILY 180 tablet 1   rosuvastatin (CRESTOR) 20 MG tablet Take 1 tablet (20 mg total) by mouth daily. 90 tablet 1   sacubitril-valsartan (ENTRESTO) 97-103 MG Take 1 tablet by mouth 2 (two) times daily. 180 tablet 1   traZODone (DESYREL) 50 MG tablet Take 50-100 mg by mouth at bedtime as needed.     tretinoin (RETIN-A) 0.05 % cream Apply 1 application topically at bedtime. Applied to face     warfarin (COUMADIN) 4 MG tablet  TAKE 1 TABLET BY MOUTH EVERY DAY AS DIRECTED BY COUMADIN CLINIC 100 tablet 0   No current facility-administered medications for this visit.    Medication Side Effects: None  Allergies:  Allergies  Allergen Reactions   Codeine Anaphylaxis    Jerking, involuntary jerking    Dilantin [Phenytoin Sodium Extended] Rash and Itching   Metoprolol Other (See Comments)    depression    Propoxyphene Other (See Comments)    Bad vivid dreams    Past Medical History:  Diagnosis Date   Anxiety    Aortic stenosis    status post aortic valve replacement with St. 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   Depression    DJD (degenerative joint disease) of knee    Dyslipidemia    GERD (gastroesophageal reflux disease)    "several years ago; none since" (11/12/2012)   Hyperlipidemia    Hyperlipidemia LDL goal <70 01/19/2017   Hypertension    Left atrial enlargement    Mitral regurgitation 06/25/2016   Mild moderate MR by echo 06/2016   Obesity    Persistent atrial fibrillation (HCC)    s/p afib ablation x 2 at [facility]   Sleep apnea    On CPAP at 12cm H2O   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   Please see review of systems for further details on the patient's review from today.   Objective:   Physical Exam:  There were no vitals taken for this visit.  Physical Exam Constitutional:      General: She is not in acute distress. Musculoskeletal:        General: No deformity.  Neurological:     Mental Status: She is alert and oriented to person, place, and time.     Coordination: Coordination  normal.  Psychiatric:        Attention and Perception: Attention and perception normal. She does not perceive auditory or visual hallucinations.        Mood and Affect: Mood normal. Mood is not anxious or depressed. Affect is not labile, blunt, angry or inappropriate.        Speech: Speech normal.        Behavior: Behavior normal.        Thought Content: Thought content normal. Thought content is not paranoid or delusional. Thought content does not include homicidal or suicidal ideation. Thought content does not include homicidal or suicidal plan.        Cognition and  Memory: Cognition and memory normal.        Judgment: Judgment normal.     Comments: Insight intact     Lab Review:     Component Value Date/Time   NA 142 06/01/2022 1000   K 4.8 06/01/2022 1000   CL 100 06/01/2022 1000   CO2 29 06/01/2022 1000   GLUCOSE 97 06/01/2022 1000   GLUCOSE 91 04/12/2021 1816   BUN 21 06/01/2022 1000   CREATININE 1.00 06/01/2022 1000   CREATININE 0.82 12/28/2015 1000   CALCIUM 9.8 06/01/2022 1000   PROT 7.4 08/14/2020 1730   PROT 7.1 05/27/2018 1125   ALBUMIN 3.8 08/14/2020 1730   ALBUMIN 4.5 05/27/2018 1125   AST 26 08/14/2020 1730   ALT 19 08/14/2020 1730   ALKPHOS 63 08/14/2020 1730   BILITOT 0.9 08/14/2020 1730   BILITOT 0.4 05/27/2018 1125   GFRNONAA >60 04/12/2021 1816   GFRAA 82 02/05/2020 1120       Component Value Date/Time   WBC 4.9 04/12/2021 1816   RBC 4.00 04/12/2021 1816   HGB 12.6 04/12/2021 1816   HGB 11.7 09/28/2020 1017   HCT 37.3 04/12/2021 1816   HCT 34.6 09/28/2020 1017   PLT 192 04/12/2021 1816   PLT 207 09/28/2020 1017   MCV 93.3 04/12/2021 1816   MCV 92 09/28/2020 1017   MCH 31.5 04/12/2021 1816   MCHC 33.8 04/12/2021 1816   RDW 12.8 04/12/2021 1816   RDW 12.6 09/28/2020 1017   LYMPHSABS 0.9 09/28/2020 1017   MONOABS 0.6 08/18/2020 0338   EOSABS 0.2 09/28/2020 1017   BASOSABS 0.0 09/28/2020 1017    No results found for: "POCLITH", "LITHIUM"    No results found for: "PHENYTOIN", "PHENOBARB", "VALPROATE", "CBMZ"   .res Assessment: Plan:    Plan:  PDMP reviewed  Trazadone 50mg  for sleep Pristiq 100mg  daily   Add Wellbutrin XL 150mg  every morning  Consider Zoloft 50mg  daily  Time spent with patient was 20 minutes. Greater than 50% of face to face time with patient was spent on counseling and coordination of care.    RTC 4 weeks  Patient advised to contact office with any questions, adverse effects, or acute worsening in signs and symptoms.  Diagnoses and all orders for this visit:  Major depressive disorder, recurrent episode, moderate (HCC) -     sertraline (ZOLOFT) 50 MG tablet; Take 1 tablet (50 mg total) by mouth daily.     Please see After Visit Summary for patient specific instructions.  Future Appointments  Date Time Provider [place]  07/27/2022 10:00 AM Donnalee Cellucci, Berdie Ogren, NP [facility] None  07/31/2022 11:00 AM [facility] None    No orders of the defined types were placed in this encounter.   -------------------------------

## 2022-06-29 NOTE — Telephone Encounter (Signed)
[name] called stating that she left Dr. Lindell Noe at [facility] but doesn't remember the exact date. When she took Bupropion XL she was prescribed #90 and has #30 left. She is not sure of the date she stopped taking it.   Pharmacy is:  [id number] - [place], [name]   Phone: [phone]  Fax: [phone]

## 2022-07-23 ENCOUNTER — Other Ambulatory Visit: Payer: Self-pay | Admitting: Adult Health

## 2022-07-23 ENCOUNTER — Other Ambulatory Visit: Payer: Self-pay | Admitting: Cardiovascular Disease

## 2022-07-23 DIAGNOSIS — F331 Major depressive disorder, recurrent, moderate: Secondary | ICD-10-CM

## 2022-07-25 NOTE — Telephone Encounter (Signed)
Has appt 4/19

## 2022-07-27 ENCOUNTER — Encounter: Payer: Self-pay | Admitting: Adult Health

## 2022-07-27 ENCOUNTER — Ambulatory Visit (INDEPENDENT_AMBULATORY_CARE_PROVIDER_SITE_OTHER): Payer: Medicare Other | Admitting: Adult Health

## 2022-07-27 DIAGNOSIS — G4700 Insomnia, unspecified: Secondary | ICD-10-CM | POA: Diagnosis not present

## 2022-07-27 DIAGNOSIS — F331 Major depressive disorder, recurrent, moderate: Secondary | ICD-10-CM | POA: Diagnosis not present

## 2022-07-27 MED ORDER — BUPROPION HCL ER (XL) 150 MG PO TB24: 150.0000 mg | ORAL_TABLET | Freq: Every day | ORAL | 3 refills | Status: DC

## 2022-07-27 MED ORDER — DESVENLAFAXINE SUCCINATE ER 100 MG PO TB24: 100.0000 mg | ORAL_TABLET | Freq: Every day | ORAL | 3 refills | Status: DC

## 2022-07-27 MED ORDER — TRAZODONE HCL 50 MG PO TABS: 50.0000 mg | ORAL_TABLET | Freq: Every evening | ORAL | 3 refills | Status: DC | PRN

## 2022-07-27 NOTE — Progress Notes (Signed)
[name] [id number] [birth year] 77 y.o.  Subjective:   Patient ID:  KYLIAH [name] is a 77 y.o. (DOB Dec 19, 1945) female.  Chief Complaint: No chief complaint on file.   HPI [name] presents to the office today for follow-up of MDD.  Describes mood today as "better". Pleasant. Decreased tearfulness. Mood symptoms - reports decreased depression - "2 days since last visit". Denies anxiety and irritability. Reports decreased worry, rumination, and over thinking. Mood has improved. Stating "I' feel the best I have in a long time". Feels like the addition of Wellbutrin has been helpful. Improved interest and motivation. Planting flowers. Getting out more with friends.Taking medications as prescribed.  Energy levels have improved. Active, does not have a regular exercise routine, but is more active. Enjoys some usual interests and activities. Married. Lives with husband and 2 [place] terriers. Has a son, age 3. Has one step daughter - 29. Spending time with family. Attends church.  Appetite adequate. Weight loss - 23 pounds over the past year. Sleeps well most nights. Averages 9 hours. Denies daytime napping.   Focus and concentration stable. Completing tasks. Managing aspects of household. Retired - [employer]. Denies SI or HI.  Denies AH or VH. Denies self harm. Denies substance use.  Previous medication trials:  Xanax, Zoloft, Wellbutrin, Valium, Ambien, Lexapro   Flowsheet Row ED from 04/13/2021 in [facility] Emergency Department at [facility] Admission (Discharged) from 10/14/2020 in [facility] CARDIAC CATH LAB ED to Hosp-Admission (Discharged) from 08/14/2020 in [facility] 3E HF PCU  C-SSRS RISK CATEGORY No Risk No Risk No Risk        Review of Systems:  Review of Systems  Musculoskeletal:  Negative for gait problem.  Neurological:  Negative for tremors.  Psychiatric/Behavioral:         Please refer to HPI     Medications: I have reviewed the patient's current medications.  Current Outpatient Medications  Medication Sig Dispense Refill   acetaminophen (TYLENOL) 500 MG tablet Take 1,000 mg by mouth every 6 (six) hours as needed (pain).     calcium-vitamin D (OSCAL WITH D) 500-200 MG-UNIT per tablet Take 2 tablets by mouth in the morning.     carvedilol (COREG) 3.125 MG tablet TAKE 1 TABLET BY MOUTH TWICE A DAY WITH MEALS 180 tablet 1   Cholecalciferol (VITAMIN D) 2000 UNITS tablet Take 4,000 Units by mouth in the morning.     desvenlafaxine (PRISTIQ) 100 MG 24 hr tablet TAKE 1 TABLET BY MOUTH EVERY DAY 30 tablet 0   digoxin (LANOXIN) 0.125 MG tablet Take 1 tablet (0.125 mg total) by mouth every other day. 45 tablet 3   ENTRESTO 97-103 MG TAKE 1 TABLET BY MOUTH TWICE A DAY 180 tablet 1   famotidine (PEPCID) 10 MG tablet Take 10 mg by mouth as needed for heartburn or indigestion.     ferrous sulfate 325 (65 FE) MG tablet Take 325 mg by mouth in the morning.     furosemide (LASIX) 80 MG tablet Take 1 tablet by mouth daily. OKAY TO TAKE EXTRA FOR WEIGHT GAIN> 3LB/ DAILY OR 5LB/WEEKLY 180 tablet 3   levothyroxine (SYNTHROID, LEVOTHROID) 100 MCG tablet Take 100 mcg daily before breakfast by mouth.     Multiple Vitamin (MULTIVITAMIN WITH MINERALS) TABS Take 1 tablet by mouth in the morning.     oxymetazoline (AFRIN) 0.05 % nasal spray Place 1 spray into both nostrils daily as needed for congestion.  potassium chloride SA (KLOR-CON M) 20 MEQ tablet TAKE 2 TABLETS(40 MEQ) BY MOUTH DAILY 180 tablet 1   rosuvastatin (CRESTOR) 20 MG tablet TAKE 1 TABLET BY MOUTH EVERY DAY 90 tablet 1   sertraline (ZOLOFT) 50 MG tablet Take 1 tablet (50 mg total) by mouth daily. 30 tablet 2   traZODone (DESYREL) 50 MG tablet Take 50-100 mg by mouth at bedtime as needed.     tretinoin (RETIN-A) 0.05 % cream Apply 1 application topically at bedtime. Applied to face     warfarin (COUMADIN) 4 MG tablet TAKE 1 TABLET BY MOUTH  EVERY DAY AS DIRECTED BY COUMADIN CLINIC 100 tablet 0   No current facility-administered medications for this visit.    Medication Side Effects: None  Allergies:  Allergies  Allergen Reactions   Codeine Anaphylaxis    Jerking, involuntary jerking    Dilantin [Phenytoin Sodium Extended] Rash and Itching   Metoprolol Other (See Comments)    depression    Propoxyphene Other (See Comments)    Bad vivid dreams    Past Medical History:  Diagnosis Date   Anxiety    Aortic stenosis    status post aortic valve replacement with St. 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   Depression    DJD (degenerative joint disease) of knee    Dyslipidemia    GERD (gastroesophageal reflux disease)    "several years ago; none since" (11/12/2012)   Hyperlipidemia    Hyperlipidemia LDL goal <70 01/19/2017   Hypertension    Left atrial enlargement    Mitral regurgitation 06/25/2016   Mild moderate MR by echo 06/2016   Obesity    Persistent atrial fibrillation (HCC)    s/p afib ablation x 2 at [facility]   Sleep apnea    On CPAP at 12cm H2O   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   Please see review of systems for further details on the patient's review from today.   Objective:   Physical Exam:  There were no vitals taken for this visit.  Physical Exam Constitutional:      General: She is not in acute distress. Musculoskeletal:        General: No deformity.  Neurological:     Mental Status: She is alert and oriented to person, place, and time.     Coordination: Coordination normal.  Psychiatric:         Attention and Perception: Attention and perception normal. She does not perceive auditory or visual hallucinations.        Mood and Affect: Mood normal. Mood is not anxious or depressed. Affect is not labile, blunt, angry or inappropriate.        Speech: Speech normal.        Behavior: Behavior normal.        Thought Content: Thought content normal. Thought content is not paranoid or delusional. Thought content does not include homicidal or suicidal ideation. Thought content does not include homicidal or suicidal plan.        Cognition and Memory: Cognition and memory normal.  Judgment: Judgment normal.     Comments: Insight intact     Lab Review:     Component Value Date/Time   NA 142 06/01/2022 1000   K 4.8 06/01/2022 1000   CL 100 06/01/2022 1000   CO2 29 06/01/2022 1000   GLUCOSE 97 06/01/2022 1000   GLUCOSE 91 04/12/2021 1816   BUN 21 06/01/2022 1000   CREATININE 1.00 06/01/2022 1000   CREATININE 0.82 12/28/2015 1000   CALCIUM 9.8 06/01/2022 1000   PROT 7.4 08/14/2020 1730   PROT 7.1 05/27/2018 1125   ALBUMIN 3.8 08/14/2020 1730   ALBUMIN 4.5 05/27/2018 1125   AST 26 08/14/2020 1730   ALT 19 08/14/2020 1730   ALKPHOS 63 08/14/2020 1730   BILITOT 0.9 08/14/2020 1730   BILITOT 0.4 05/27/2018 1125   GFRNONAA >60 04/12/2021 1816   GFRAA 82 02/05/2020 1120       Component Value Date/Time   WBC 4.9 04/12/2021 1816   RBC 4.00 04/12/2021 1816   HGB 12.6 04/12/2021 1816   HGB 11.7 09/28/2020 1017   HCT 37.3 04/12/2021 1816   HCT 34.6 09/28/2020 1017   PLT 192 04/12/2021 1816   PLT 207 09/28/2020 1017   MCV 93.3 04/12/2021 1816   MCV 92 09/28/2020 1017   MCH 31.5 04/12/2021 1816   MCHC 33.8 04/12/2021 1816   RDW 12.8 04/12/2021 1816   RDW 12.6 09/28/2020 1017   LYMPHSABS 0.9 09/28/2020 1017   MONOABS 0.6 08/18/2020 0338   EOSABS 0.2 09/28/2020 1017   BASOSABS 0.0 09/28/2020 1017    No results found for: "POCLITH", "LITHIUM"   No results found for:  "PHENYTOIN", "PHENOBARB", "VALPROATE", "CBMZ"   .res Assessment: Plan:    Plan:  PDMP reviewed  Trazadone  for sleep Pristiq  daily  Wellbutrin XL  every morning  Consider Zoloft  daily - did not start.  Time spent with patient was 20 minutes. Greater than 50% of face to face time with patient was spent on counseling and coordination of care.    RTC 3 months  Patient advised to contact office with any questions, adverse effects, or acute worsening in signs and symptoms.  There are no diagnoses linked to this encounter.   Please see After Visit Summary for patient specific instructions.  Future Appointments  Date Time Provider Department Center  07/31/2022 11:00 AM [facility] None    No orders of the defined types were placed in this encounter.   -------------------------------

## 2022-07-31 ENCOUNTER — Ambulatory Visit: Payer: Medicare Other | Attending: Cardiovascular Disease | Admitting: *Deleted

## 2022-07-31 DIAGNOSIS — Z5181 Encounter for therapeutic drug level monitoring: Secondary | ICD-10-CM | POA: Insufficient documentation

## 2022-07-31 DIAGNOSIS — I480 Paroxysmal atrial fibrillation: Secondary | ICD-10-CM | POA: Insufficient documentation

## 2022-07-31 DIAGNOSIS — H2513 Age-related nuclear cataract, bilateral: Secondary | ICD-10-CM | POA: Diagnosis not present

## 2022-07-31 LAB — POCT INR: INR: 2.1 (ref 2.0–3.0)

## 2022-07-31 NOTE — Patient Instructions (Signed)
Description   Continue taking Warfarin 1 tablet daily. Recheck INR in 6 weeks.   Coumadin Clinic [phone] or [phone]

## 2022-08-13 ENCOUNTER — Ambulatory Visit
Admission: RE | Admit: 2022-08-13 | Discharge: 2022-08-13 | Disposition: A | Discharge status: Home / Self Care | Payer: Medicare Other | Source: Ambulatory Visit | Attending: Urgent Care | Admitting: Urgent Care

## 2022-08-13 VITALS — BP 100/64 | HR 73 | Temp 97.7°F | Resp 18

## 2022-08-13 DIAGNOSIS — M109 Gout, unspecified: Secondary | ICD-10-CM | POA: Diagnosis not present

## 2022-08-13 MED ORDER — KETOROLAC TROMETHAMINE 30 MG/ML IJ SOLN
30.0000 mg | Freq: Once | INTRAMUSCULAR | Status: AC
  Administered 2022-08-13: 30 mg via INTRAMUSCULAR

## 2022-08-13 MED ORDER — PREDNISONE 20 MG PO TABS: ORAL_TABLET | ORAL | 0 refills | Status: AC

## 2022-08-13 NOTE — ED Provider Notes (Signed)
[name]    CSN: [id number] Arrival date & time: 08/13/22  1343      History   Chief Complaint Chief Complaint  Patient presents with   Foot Pain    I'm pretty sure I have gout, right foot - Entered by patient    HPI [name] is a 77 y.o. female.    Foot Pain    Presents to urgent care with complaint of right foot pain.  Part indicates history of gout dating to 67.  Most recent GFR with reduced kidney function =  58  Past Medical History:  Diagnosis Date   Anxiety    Aortic stenosis    status post aortic valve replacement with St. 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   Depression    DJD (degenerative joint disease) of knee    Dyslipidemia    GERD (gastroesophageal reflux disease)    "several years ago; none since" (11/12/2012)   Hyperlipidemia    Hyperlipidemia LDL goal <70 01/19/2017   Hypertension    Left atrial enlargement    Mitral regurgitation 06/25/2016   Mild moderate MR by echo 06/2016   Obesity    Persistent atrial fibrillation (HCC)    s/p afib ablation x 2 at [facility]   Sleep apnea    On CPAP at 12cm H2O   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 - now cleared by neurosurgery to maintain INR 2-2.5    Patient Active Problem List   Diagnosis Date Noted   Secondary hypercoagulable state (HCC) 06/22/2021   Atherosclerosis of aorta (HCC) 03/05/2021   Aneurysm of descending thoracic aorta without rupture (HCC) 03/05/2021   Chronic restrictive lung disease 03/05/2021   Persistent atrial fibrillation (HCC) 03/05/2021   Sepsis (HCC) 08/14/2020   CHF exacerbation (HCC) 09/25/2019   Acute respiratory failure with hypoxia (HCC) 09/24/2019   History of subdural hematoma 09/24/2019   Hypothyroidism 09/24/2019    Acute on chronic combined systolic and diastolic CHF (congestive heart failure) (HCC) 09/24/2019   Permanent atrial fibrillation (HCC) 06/18/2019   Severe tricuspid regurgitation 12/18/2018   Cholelithiasis without cholecystitis 07/31/2017   Family history of colon cancer 07/31/2017   Slow transit constipation 07/31/2017   Hematoma 07/11/2017   Rectus sheath hematoma, initial encounter    Hyperlipidemia LDL goal <70 01/19/2017   Morbid obesity due to excess calories (HCC) complicated by low ERV on pfts/ hbp/hyperlipidemia 09/02/2016   Mitral regurgitation 06/25/2016   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   Dyspnea on exertion 04/04/2016   Sinusitis, chronic 04/04/2016   Cough variant asthma vs UACS 04/03/2016   Primary osteoarthritis of left knee    DJD (degenerative joint disease) of knee 11/05/2014   Encounter for therapeutic drug monitoring 05/07/2013   Obstructive sleep apnea 01/20/2013   H/O mechanical aortic valve replacement 01/15/2013   Aortic valve disorder 01/15/2013   Chronic diastolic CHF (congestive heart failure) (HCC) 11/14/2012   Paroxysmal atrial fibrillation (HCC) 07/17/2012   Essential hypertension 07/17/2012    Past Surgical History:  Procedure Laterality Date   BURR HOLE FOR SUBDURAL HEMATOMA  2006   CARDIAC VALVE REPLACEMENT  2006   St. Jude AVR   CARDIOVERSION N/A 07/22/2012   Procedure: CARDIOVERSION;  Surgeon: Donato Schultz,  MD;  Location: [facility] ENDOSCOPY;  Service: Cardiovascular;  Laterality: N/A;   CARDIOVERSION N/A 11/25/2012   Procedure: CARDIOVERSION;  Surgeon: Quintella Reichert, MD;  Location: [facility] ENDOSCOPY;  Service: Cardiovascular;  Laterality: N/A;   CARDIOVERSION N/A 12/30/2012   Procedure: CARDIOVERSION;  Surgeon: Quintella Reichert, MD;  Location[facility] ENDOSCOPY;  Service: Cardiovascular;  Laterality: N/A;  h&p in file-HW    CARDIOVERSION N/A 03/30/2013   Procedure: CARDIOVERSION;  Surgeon: Quintella Reichert, MD;  Location: [facility] ENDOSCOPY;  Service:  Cardiovascular;  Laterality: N/A;   ELECTROPHYSIOLOGIC STUDY  11/2013   Afib ablation x 2 (11/2013 and 10/2015) at [facility] by Dr Smith Robert with recurrence post ablation   KNEE ARTHROSCOPY Left 1980's   "2" (11/12/2012)   KNEE SURGERY Left 1980's   "after 2 scopes they went in and did some kind of OR" (11/12/2012)   RIGHT/LEFT HEART CATH AND CORONARY ANGIOGRAPHY N/A 10/14/2020   Procedure: RIGHT/LEFT HEART CATH AND CORONARY ANGIOGRAPHY;  Surgeon: Swazil[place], Peter M, MD;  Location: [facility] INVASIVE CV LAB;  Service: Cardiovascular;  Laterality: N/A;   TEE WITHOUT CARDIOVERSION N/A 07/22/2012   Procedure: TRANSESOPHAGEAL ECHOCARDIOGRAM (TEE);  Surgeon: Donato Schultz, MD;  Location: [facility] ENDOSCOPY;  Service: Cardiovascular;  Laterality: N/A;  Rm 2034   TEE WITHOUT CARDIOVERSION N/A 10/07/2013   Procedure: TRANSESOPHAGEAL ECHOCARDIOGRAM (TEE);  Surgeon: Donato Schultz, MD;  Location: [facility] ENDOSCOPY;  Service: Cardiovascular;  Laterality: N/A;   TEE WITHOUT CARDIOVERSION N/A 07/12/2020   Procedure: TRANSESOPHAGEAL ECHOCARDIOGRAM (TEE);  Surgeon: Thurmon Fair, MD;  Location: [facility] ENDOSCOPY;  Service: Cardiovascular;  Laterality: N/A;   TOTAL KNEE ARTHROPLASTY Left 11/05/2014   Procedure: TOTAL KNEE ARTHROPLASTY;  Surgeon: Jodi Geralds, MD;  Location: [facility] OR;  Service: Orthopedics;  Laterality: Left;   TUBAL LIGATION  1980's    OB History   No obstetric history on file.      Home Medications    Prior to Admission medications   Medication Sig Start Date End Date Taking? Authorizing Provider  acetaminophen (TYLENOL) 500 MG tablet Take 1,000 mg by mouth every 6 (six) hours as needed (pain).    [provider]  buPROPion (WELLBUTRIN XL) 150 MG 24 hr tablet Take 1 tablet (150 mg total) by mouth daily. 07/27/22   Mozingo, Thereasa Solo, NP  calcium-vitamin D (OSCAL WITH D) 500-200 MG-UNIT per tablet Take 2 tablets by mouth in the morning.    [provider]  carvedilol (COREG) 3.125 MG tablet TAKE 1 TABLET BY MOUTH  TWICE A DAY WITH MEALS 06/12/22   Croitoru, Mihai, MD  Cholecalciferol (VITAMIN D) 2000 UNITS tablet Take 4,000 Units by mouth in the morning.    [provider]  desvenlafaxine (PRISTIQ) 100 MG 24 hr tablet Take 1 tablet (100 mg total) by mouth daily. 07/27/22   Mozingo, Thereasa Solo, NP  digoxin (LANOXIN) 0.125 MG tablet Take 1 tablet (0.125 mg total) by mouth every other day. 09/14/21   Croitoru, Mihai, MD  ENTRESTO 97-103 MG TAKE 1 TABLET BY MOUTH TWICE A DAY 07/25/22   Croitoru, Mihai, MD  famotidine (PEPCID) 10 MG tablet Take 10 mg by mouth as needed for heartburn or indigestion.    [provider]  ferrous sulfate 325 (65 FE) MG tablet Take 325 mg by mouth in the morning.    [provider]  furosemide (LASIX) 80 MG tablet Take 1 tablet by mouth daily. OKAY TO TAKE EXTRA FOR WEIGHT GAIN> 3LB/ DAILY OR 5LB/WEEKLY 05/30/22   Croitoru, Rachelle Hora, MD  levothyroxine (  SYNTHROID, LEVOTHROID) 100 MCG tablet Take 100 mcg daily before breakfast by mouth.    [provider]  Multiple Vitamin (MULTIVITAMIN WITH MINERALS) TABS Take 1 tablet by mouth in the morning.    [provider]  oxymetazoline (AFRIN) 0.05 % nasal spray Place 1 spray into both nostrils daily as needed for congestion.    [provider]  potassium chloride SA (KLOR-CON M) 20 MEQ tablet TAKE 2 TABLETS(40 MEQ) BY MOUTH DAILY 03/28/22   Croitoru, Mihai, MD  rosuvastatin (CRESTOR) 20 MG tablet TAKE 1 TABLET BY MOUTH EVERY DAY 07/25/22   Croitoru, Mihai, MD  sertraline (ZOLOFT) 50 MG tablet Take 1 tablet (50 mg total) by mouth daily. 06/29/22   Mozingo, Thereasa Solo, NP  traZODone (DESYREL) 50 MG tablet Take 1 tablet (50 mg total) by mouth at bedtime as needed. 07/27/22   Mozingo, Thereasa Solo, NP  tretinoin (RETIN-A) 0.05 % cream Apply 1 application topically at bedtime. Applied to face    [provider]  warfarin (COUMADIN) 4 MG tablet TAKE 1 TABLET BY MOUTH EVERY DAY AS DIRECTED  BY COUMADIN CLINIC 06/11/22   Croitoru, Rachelle Hora, MD    Family History Family History  Problem Relation Age of Onset   Lung cancer Mother    Hypertension Mother    Hyperlipidemia Father    Hypertension Father    Heart disease Brother     Social History Social History   Tobacco Use   Smoking status: Never   Smokeless tobacco: Never   Tobacco comments:    Never smoke 06/22/21  Substance Use Topics   Alcohol use: No    Comment: 11/12/2012 "no alcohol in the last 9 years"   Drug use: No     Allergies   Codeine, Dilantin [phenytoin sodium extended], Metoprolol, and Propoxyphene   Review of Systems Review of Systems   Physical Exam Triage Vital Signs ED Triage Vitals [08/13/22 1429]  Enc Vitals Group     BP 100/64     Pulse Rate 73     Resp 18     Temp 97.7 F (36.5 C)     Temp Source Oral     SpO2 94 %     Weight      Height      Head Circumference      Peak Flow      Pain Score      Pain Loc      Pain Edu?      Excl. in GC?    No data found.  Updated Vital Signs BP 100/64 (BP Location: Left Arm)   Pulse 73   Temp 97.7 F (36.5 C) (Oral)   Resp 18   SpO2 94%   Visual Acuity Right Eye Distance:   Left Eye Distance:   Bilateral Distance:    Right Eye Near:   Left Eye Near:    Bilateral Near:     Physical Exam Vitals reviewed.  Constitutional:      Appearance: Normal appearance.  Musculoskeletal:     Right foot: Tenderness present.  Skin:    General: Skin is warm and dry.  Neurological:     General: No focal deficit present.     Mental Status: She is alert and oriented to person, place, and time.  Psychiatric:        Mood and Affect: Mood normal.        Behavior: Behavior normal.      UC Treatments / Results  Labs (all  labs ordered are listed, but only abnormal results are displayed) Labs Reviewed - No data to display  EKG   Radiology No results found.  Procedures Procedures (including critical care time)  Medications Ordered  in UC Medications - No data to display  Initial Impression / Assessment and Plan / UC Course  I have reviewed the triage vital signs and the nursing notes.  Pertinent labs & imaging results that were available during my care of the patient were reviewed by me and considered in my medical decision making (see chart for details).   Erythema at the first right metatarsal.  Exquisitely tender to palpation.  Patient with some reduced kidney function.  Will give ketorolac 30 mg IM, asked her not to take any ibuprofen or naproxen today and start her on prednisone taper.  Counseled patient on potential for adverse effects with medications prescribed/recommended today, ER and return-to-clinic precautions discussed, patient verbalized understanding and agreement with care plan.   Final Clinical Impressions(s) / UC Diagnoses   Final diagnoses:  None   Discharge Instructions   None    ED Prescriptions   None    PDMP not reviewed this encounter.   Charma Igo, [place] 08/13/22 [phone]

## 2022-08-13 NOTE — Discharge Instructions (Signed)
Follow-up with your primary care provider.  Ask for blood work for uric acid.

## 2022-08-13 NOTE — ED Triage Notes (Signed)
Patient presents to [facility] for right foot pain since yesterday. Has not had any OTC meds. States she is concerned with gout.

## 2022-08-15 ENCOUNTER — Encounter: Payer: Self-pay | Admitting: Cardiovascular Disease

## 2022-08-26 ENCOUNTER — Other Ambulatory Visit: Payer: Self-pay | Admitting: Cardiovascular Disease

## 2022-09-09 ENCOUNTER — Other Ambulatory Visit: Payer: Self-pay | Admitting: Cardiovascular Disease

## 2022-09-09 DIAGNOSIS — I480 Paroxysmal atrial fibrillation: Secondary | ICD-10-CM

## 2022-09-10 ENCOUNTER — Other Ambulatory Visit: Payer: Self-pay | Admitting: Cardiovascular Disease

## 2022-09-11 MED ORDER — POTASSIUM CHLORIDE CRYS ER 20 MEQ PO TBCR: EXTENDED_RELEASE_TABLET | ORAL | 2 refills | Status: DC

## 2022-09-12 ENCOUNTER — Ambulatory Visit: Payer: Medicare Other | Attending: Cardiovascular Disease

## 2022-09-12 DIAGNOSIS — I480 Paroxysmal atrial fibrillation: Secondary | ICD-10-CM

## 2022-09-12 DIAGNOSIS — Z5181 Encounter for therapeutic drug level monitoring: Secondary | ICD-10-CM

## 2022-09-12 LAB — POCT INR: INR: 1.7 — AB (ref 2.0–3.0)

## 2022-09-12 NOTE — Patient Instructions (Signed)
Description   Take 1.5 tablets today and then continue taking Warfarin 1 tablet daily.  Recheck INR in 5 weeks.   Coumadin Clinic [phone]

## 2022-10-16 ENCOUNTER — Ambulatory Visit: Payer: Medicare Other | Attending: Cardiology

## 2022-10-16 DIAGNOSIS — Z5181 Encounter for therapeutic drug level monitoring: Secondary | ICD-10-CM | POA: Insufficient documentation

## 2022-10-16 DIAGNOSIS — I480 Paroxysmal atrial fibrillation: Secondary | ICD-10-CM | POA: Insufficient documentation

## 2022-10-16 LAB — POCT INR: INR: 2.5 (ref 2.0–3.0)

## 2022-10-16 NOTE — Patient Instructions (Signed)
continue taking Warfarin 1 tablet daily. Recheck INR in 6 weeks. Coumadin Clinic [phone] 

## 2022-10-26 ENCOUNTER — Ambulatory Visit: Payer: Medicare Other | Admitting: Adult Health

## 2022-10-27 ENCOUNTER — Ambulatory Visit
Admission: RE | Admit: 2022-10-27 | Discharge: 2022-10-27 | Disposition: A | Discharge status: Home / Self Care | Payer: Medicare Other | Source: Ambulatory Visit | Attending: Internal Medicine | Admitting: Internal Medicine

## 2022-10-27 VITALS — BP 112/69 | HR 80 | Temp 99.1°F | Resp 18

## 2022-10-27 DIAGNOSIS — M1000 Idiopathic gout, unspecified site: Secondary | ICD-10-CM | POA: Diagnosis not present

## 2022-10-27 MED ORDER — OXYCODONE-ACETAMINOPHEN 5-325 MG PO TABS: 1.0000 | ORAL_TABLET | Freq: Four times a day (QID) | ORAL | 0 refills | Status: DC | PRN

## 2022-10-27 MED ORDER — METHYLPREDNISOLONE 4 MG PO TBPK: ORAL_TABLET | ORAL | 0 refills | Status: DC

## 2022-10-27 NOTE — ED Triage Notes (Addendum)
Pt c/o gout flare up to left foot that began 3 days ago.  Home interventions: tylenol

## 2022-10-27 NOTE — ED Provider Notes (Signed)
Renaldo Fiddler    CSN: [id number] Arrival date & time: 10/27/22  4098      History   Chief Complaint Chief Complaint  Patient presents with   Foot Pain    I have VERY painful gout in left foot - Entered by patient    HPI VIVIENNE SANGIOVANNI is a 77 y.o. female presents with L middle toe redness and pain x 3 days. Started with just pain, and woke up this am red and hot L middle toe. She has had gout in her R great toe and feels the same way.     Past Medical History:  Diagnosis Date   Anxiety    Aortic stenosis    status post aortic valve replacement with St. 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   Depression    DJD (degenerative joint disease) of knee    Dyslipidemia    GERD (gastroesophageal reflux disease)    "several years ago; none since" (11/12/2012)   Hyperlipidemia    Hyperlipidemia LDL goal <70 01/19/2017   Hypertension    Left atrial enlargement    Mitral regurgitation 06/25/2016   Mild moderate MR by echo 06/2016   Obesity    Persistent atrial fibrillation (HCC)    s/p afib ablation x 2 at [facility]   Sleep apnea    On CPAP at 12cm H2O   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 - now cleared by neurosurgery to maintain INR 2-2.5    Patient Active Problem List   Diagnosis Date Noted   Secondary hypercoagulable state (HCC) 06/22/2021   Atherosclerosis of aorta (HCC) 03/05/2021   Aneurysm of descending thoracic aorta without rupture (HCC) 03/05/2021   Chronic restrictive lung disease 03/05/2021   Persistent atrial fibrillation (HCC) 03/05/2021   Sepsis (HCC) 08/14/2020   CHF exacerbation (HCC) 09/25/2019   Acute respiratory failure with hypoxia (HCC) 09/24/2019   History of subdural hematoma 09/24/2019   Hypothyroidism 09/24/2019    Acute on chronic combined systolic and diastolic CHF (congestive heart failure) (HCC) 09/24/2019   Permanent atrial fibrillation (HCC) 06/18/2019   Severe tricuspid regurgitation 12/18/2018   Cholelithiasis without cholecystitis 07/31/2017   Family history of colon cancer 07/31/2017   Slow transit constipation 07/31/2017   Hematoma 07/11/2017   Rectus sheath hematoma, initial encounter    Hyperlipidemia LDL goal <70 01/19/2017   Morbid obesity due to excess calories (HCC) complicated by low ERV on pfts/ hbp/hyperlipidemia 09/02/2016   Mitral regurgitation 06/25/2016   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   Dyspnea on exertion 04/04/2016   Sinusitis, chronic 04/04/2016   Cough variant asthma vs UACS 04/03/2016   Primary osteoarthritis of left knee    DJD (degenerative joint disease) of knee 11/05/2014   Encounter for therapeutic drug monitoring 05/07/2013   Obstructive sleep apnea 01/20/2013   H/O mechanical aortic valve replacement 01/15/2013   Aortic valve disorder 01/15/2013   Chronic diastolic CHF (congestive heart failure) (HCC) 11/14/2012   Paroxysmal atrial fibrillation (HCC) 07/17/2012   Essential hypertension 07/17/2012    Past Surgical History:  Procedure Laterality Date   BURR HOLE FOR SUBDURAL HEMATOMA  2006   CARDIAC VALVE REPLACEMENT  2006   St. Jude AVR   CARDIOVERSION N/A 07/22/2012   Procedure:  CARDIOVERSION;  Surgeon: Donato Schultz, MD;  Location: [facility] ENDOSCOPY;  Service: Cardiovascular;  Laterality: N/A;   CARDIOVERSION N/A 11/25/2012   Procedure: CARDIOVERSION;  Surgeon: Quintella Reichert, MD;  Location: [facility] ENDOSCOPY;  Service: Cardiovascular;  Laterality: N/A;   CARDIOVERSION N/A 12/30/2012   Procedure: CARDIOVERSION;  Surgeon: Quintella Reichert, MD;  Location: [facility] ENDOSCOPY;  Service: Cardiovascular;  Laterality: N/A;  h&p in file-HW    CARDIOVERSION N/A 03/30/2013   Procedure: CARDIOVERSION;  Surgeon: Quintella Reichert, MD;  Location: [facility] ENDOSCOPY;  Service:  Cardiovascular;  Laterality: N/A;   ELECTROPHYSIOLOGIC STUDY  11/2013   Afib ablation x 2 (11/2013 and 10/2015) at [facility] by Dr Smith Robert with recurrence post ablation   KNEE ARTHROSCOPY Left 1980's   "2" (11/12/2012)   KNEE SURGERY Left 1980's   "after 2 scopes they went in and did some kind of OR" (11/12/2012)   RIGHT/LEFT HEART CATH AND CORONARY ANGIOGRAPHY N/A 10/14/2020   Procedure: RIGHT/LEFT HEART CATH AND CORONARY ANGIOGRAPHY;  Surgeon: Swaziland, Peter M, MD;  Location: [facility] INVASIVE CV LAB;  Service: Cardiovascular;  Laterality: N/A;   TEE WITHOUT CARDIOVERSION N/A 07/22/2012   Procedure: TRANSESOPHAGEAL ECHOCARDIOGRAM (TEE);  Surgeon: Donato Schultz, MD;  Location: [facility] ENDOSCOPY;  Service: Cardiovascular;  Laterality: N/A;  Rm 2034   TEE WITHOUT CARDIOVERSION N/A 10/07/2013   Procedure: TRANSESOPHAGEAL ECHOCARDIOGRAM (TEE);  Surgeon: Donato Schultz, MD;  Location: [facility] ENDOSCOPY;  Service: Cardiovascular;  Laterality: N/A;   TEE WITHOUT CARDIOVERSION N/A 07/12/2020   Procedure: TRANSESOPHAGEAL ECHOCARDIOGRAM (TEE);  Surgeon: Thurmon Fair, MD;  Location: [facility] ENDOSCOPY;  Service: Cardiovascular;  Laterality: N/A;   TOTAL KNEE ARTHROPLASTY Left 11/05/2014   Procedure: TOTAL KNEE ARTHROPLASTY;  Surgeon: Jodi Geralds, MD;  Location: [facility] OR;  Service: Orthopedics;  Laterality: Left;   TUBAL LIGATION  1980's    OB History   No obstetric history on file.      Home Medications    Prior to Admission medications   Medication Sig Start Date End Date Taking? Authorizing Provider  methylPREDNISolone (MEDROL DOSEPAK) 4 MG TBPK tablet Take as directed 10/27/22  Yes Rodriguez-Southworth, Nettie Elm, PA-C  oxyCODONE-acetaminophen (PERCOCET/ROXICET) 5-325 MG tablet Take 1 tablet by mouth every 6 (six) hours as needed for severe pain. 10/27/22  Yes Rodriguez-Southworth, Nettie Elm, PA-C  buPROPion (WELLBUTRIN XL) 150 MG 24 hr tablet Take 1 tablet (150 mg total) by mouth daily. 07/27/22   Mozingo, Thereasa Solo, NP  calcium-vitamin D  (OSCAL WITH D) 500-200 MG-UNIT per tablet Take 2 tablets by mouth in the morning.    [provider]  carvedilol (COREG) 3.125 MG tablet TAKE 1 TABLET BY MOUTH TWICE A DAY WITH MEALS 06/12/22   Croitoru, Mihai, MD  Cholecalciferol (VITAMIN D) 2000 UNITS tablet Take 4,000 Units by mouth in the morning.    [provider]  desvenlafaxine (PRISTIQ) 100 MG 24 hr tablet Take 1 tablet (100 mg total) by mouth daily. 07/27/22   Mozingo, Thereasa Solo, NP  digoxin (LANOXIN) 0.125 MG tablet Take 1 tablet (125 mcg total) by mouth daily. Patient needs to keep upcoming appointment for further refills. 08/27/22   Croitoru, Mihai, MD  ENTRESTO 97-103 MG TAKE 1 TABLET BY MOUTH TWICE A DAY 07/25/22   Croitoru, Mihai, MD  famotidine (PEPCID) 10 MG tablet Take 10 mg by mouth as needed for heartburn or indigestion.    [provider]  ferrous sulfate 325 (65 FE) MG tablet Take 325 mg by mouth in the morning.    [provider]  furosemide (LASIX) 80 MG tablet Take 1 tablet by mouth daily. OKAY TO TAKE EXTRA FOR WEIGHT GAIN> 3LB/ DAILY OR 5LB/WEEKLY 05/30/22   Croitoru, Mihai, MD  levothyroxine (SYNTHROID, LEVOTHROID) 100 MCG tablet Take 100 mcg daily before breakfast by mouth.    [provider]  Multiple Vitamin (MULTIVITAMIN WITH MINERALS) TABS Take 1 tablet by mouth in the morning.    [provider]  oxymetazoline (AFRIN) 0.05 % nasal spray Place 1 spray into both nostrils daily as needed for congestion.    [provider]  potassium chloride SA (KLOR-CON M) 20 MEQ tablet TAKE 2 TABLETS(40 MEQ) BY MOUTH DAILY 09/11/22   Croitoru, Mihai, MD  rosuvastatin (CRESTOR) 20 MG tablet TAKE 1 TABLET BY MOUTH EVERY DAY 07/25/22   Croitoru, Mihai, MD  sertraline (ZOLOFT) 50 MG tablet Take 1 tablet (50 mg total) by mouth daily. 06/29/22   Mozingo, Thereasa Solo, NP  traZODone (DESYREL) 50 MG tablet Take 1 tablet (50 mg total) by mouth at bedtime as needed. 07/27/22    Mozingo, Thereasa Solo, NP  tretinoin (RETIN-A) 0.05 % cream Apply 1 application topically at bedtime. Applied to face    [provider]  warfarin (COUMADIN) 4 MG tablet TAKE 1 TABLET BY MOUTH EVERY DAY AS DIRECTED BY COUMADIN CLINIC 09/10/22   Croitoru, Rachelle Hora, MD    Family History Family History  Problem Relation Age of Onset   Lung cancer Mother    Hypertension Mother    Hyperlipidemia Father    Hypertension Father    Heart disease Brother     Social History Social History   Tobacco Use   Smoking status: Never   Smokeless tobacco: Never   Tobacco comments:    Never smoke 06/22/21  Substance Use Topics   Alcohol use: No    Comment: 11/12/2012 "no alcohol in the last 9 years"   Drug use: No     Allergies   Codeine, Dilantin [phenytoin sodium extended], Metoprolol, and Propoxyphene   Review of Systems Review of Systems  As noted in HPI Physical Exam Triage Vital Signs ED Triage Vitals  Encounter Vitals Group     BP 10/27/22 0931 112/69     Systolic BP Percentile --      Diastolic BP Percentile --      Pulse Rate 10/27/22 0931 80     Resp 10/27/22 0931 18     Temp 10/27/22 0931 99.1 F (37.3 C)     Temp Source 10/27/22 0931 Oral     SpO2 10/27/22 0931 95 %     Weight --      Height --      Head Circumference --      Peak Flow --      Pain Score 10/27/22 0929 8     Pain Loc --      Pain Education --      Exclude from Growth Chart --    No data found.  Updated Vital Signs BP 112/69 (BP Location: Left Arm)   Pulse 80   Temp 99.1 F (37.3 C) (Oral)   Resp 18   SpO2 95%   Visual Acuity Right Eye Distance:   Left Eye Distance:   Bilateral Distance:    Right Eye Near:   Left Eye Near:    Bilateral Near:     Physical Exam Vitals and nursing note reviewed.  Constitutional:      General: She is not in acute distress.    Appearance: She is  obese. She is not toxic-appearing.  HENT:     Right Ear: External ear normal.     Left Ear:  External ear normal.  Eyes:     General: No scleral icterus.    Conjunctiva/sclera: Conjunctivae normal.  Cardiovascular:     Pulses: Normal pulses.  Musculoskeletal:     Cervical back: Neck supple.     Comments: L middle toe erythematous, warm, and very tender. There are no open skin areas around this toe or foot. There are no streaking from the red the to the foot  Skin:    Findings: Erythema present.  Neurological:     Mental Status: She is alert and oriented to person, place, and time.     Sensory: No sensory deficit.     Gait: Gait normal.  Psychiatric:        Mood and Affect: Mood normal.        Behavior: Behavior normal.        Thought Content: Thought content normal.        Judgment: Judgment normal.      UC Treatments / Results  Labs (all labs ordered are listed, but only abnormal results are displayed) Labs Reviewed - No data to display  EKG   Radiology No results found.  Procedures Procedures (including critical care time)  Medications Ordered in UC Medications - No data to display  Initial Impression / Assessment and Plan / UC Course  I have reviewed the triage vital signs and the nursing notes.  Acute gout  I placed her on Medrol as noted. I placed her on Percocet for pain since she is on Cumadin and NSAID's are contraindicated with her medications.    Final Clinical Impressions(s) / UC Diagnoses   Final diagnoses:  Acute idiopathic gout, unspecified site   Discharge Instructions   None    ED Prescriptions     Medication Sig Dispense Auth. Provider   methylPREDNISolone (MEDROL DOSEPAK) 4 MG TBPK tablet Take as directed 21 tablet Rodriguez-Southworth, Nettie Elm, PA-C   oxyCODONE-acetaminophen (PERCOCET/ROXICET) 5-325 MG tablet Take 1 tablet by mouth every 6 (six) hours as needed for severe pain. 15 tablet Rodriguez-Southworth, Nettie Elm, PA-C      I have reviewed the PDMP during this encounter.   Garey Ham, PA-C 10/27/22  1048

## 2022-11-06 ENCOUNTER — Ambulatory Visit (INDEPENDENT_AMBULATORY_CARE_PROVIDER_SITE_OTHER): Payer: Medicare Other | Admitting: Adult Health

## 2022-11-06 ENCOUNTER — Encounter: Payer: Self-pay | Admitting: Adult Health

## 2022-11-06 DIAGNOSIS — F331 Major depressive disorder, recurrent, moderate: Secondary | ICD-10-CM | POA: Diagnosis not present

## 2022-11-06 MED ORDER — BUPROPION HCL ER (XL) 300 MG PO TB24: 300.0000 mg | ORAL_TABLET | Freq: Every day | ORAL | 5 refills | Status: DC

## 2022-11-06 NOTE — Progress Notes (Signed)
[name] [id number] Nov 23, 1945 77 y.o.  Subjective:   Patient ID:  [name] is a 77 y.o. (DOB 1945/08/02) female.  Chief Complaint: No chief complaint on file.   HPI [name] presents to the office today for follow-up of MDD.  Describes mood today as "not as good as I was". Pleasant. Reports tearfulness - 3 to 4 times a week. Mood symptoms - reports increased depression. Denies anxiety and irritability. Reports some worry, rumination, and over thinking - "I have drifted back into that". Mood has lower since last visit. Stating "I'm not feeling as well as I was". Feels like the addition of Wellbutrin has been helpful, but feels it is not working as well. Improved interest and motivation. Taking medications as prescribed.  Energy levels have improved. Active, does not have a regular exercise routine, but is more active. Enjoys some usual interests and activities. Married. Lives with husband and 2 [place] terriers. Has a son, age 61. Has one step daughter - 77. Spending time with family. Attends church.  Appetite adequate. Weight loss - 185 pounds. Sleeps well most nights. Averages 8 hours. Denies daytime napping.   Focus and concentration stable. Completing tasks. Managing aspects of household. Retired - [employer]. Denies SI or HI.  Denies AH or VH. Denies self harm. Denies substance use.  Previous medication trials:  Xanax, Zoloft, Wellbutrin, Valium, Ambien, Lexapro   Flowsheet Row ED from 10/27/2022 in [facility] at [facility]  ED from 08/13/2022 in [facility] at [facility]  ED from 04/13/2021 in [facility] at [facility]  C-SSRS RISK CATEGORY No Risk No Risk No Risk        Review of Systems:  Review of Systems  Musculoskeletal:  Negative for gait problem.  Neurological:  Negative for tremors.  Psychiatric/Behavioral:         Please refer to HPI    Medications: I have reviewed the patient's  current medications.  Current Outpatient Medications  Medication Sig Dispense Refill   buPROPion (WELLBUTRIN XL) 150 MG 24 hr tablet Take 1 tablet (150 mg total) by mouth daily. 90 tablet 3   calcium-vitamin D (OSCAL WITH D) 500-200 MG-UNIT per tablet Take 2 tablets by mouth in the morning.     carvedilol (COREG) 3.125 MG tablet TAKE 1 TABLET BY MOUTH TWICE A DAY WITH MEALS 180 tablet 1   Cholecalciferol (VITAMIN D) 2000 UNITS tablet Take 4,000 Units by mouth in the morning.     desvenlafaxine (PRISTIQ) 100 MG 24 hr tablet Take 1 tablet (100 mg total) by mouth daily. 90 tablet 3   digoxin (LANOXIN) 0.125 MG tablet Take 1 tablet (125 mcg total) by mouth daily. Patient needs to keep upcoming appointment for further refills. 90 tablet 0   ENTRESTO 97-103 MG TAKE 1 TABLET BY MOUTH TWICE A DAY 180 tablet 1   famotidine (PEPCID) 10 MG tablet Take 10 mg by mouth as needed for heartburn or indigestion.     ferrous sulfate 325 (65 FE) MG tablet Take 325 mg by mouth in the morning.     furosemide (LASIX) 80 MG tablet Take 1 tablet by mouth daily. OKAY TO TAKE EXTRA FOR WEIGHT GAIN> 3LB/ DAILY OR 5LB/WEEKLY 180 tablet 3   levothyroxine (SYNTHROID, LEVOTHROID) 100 MCG tablet Take 100 mcg daily before breakfast by mouth.     methylPREDNISolone (MEDROL DOSEPAK) 4 MG TBPK tablet Take as directed 21 tablet 0   Multiple Vitamin (MULTIVITAMIN WITH  MINERALS) TABS Take 1 tablet by mouth in the morning.     oxyCODONE-acetaminophen (PERCOCET/ROXICET) 5-325 MG tablet Take 1 tablet by mouth every 6 (six) hours as needed for severe pain. 15 tablet 0   oxymetazoline (AFRIN) 0.05 % nasal spray Place 1 spray into both nostrils daily as needed for congestion.     potassium chloride SA (KLOR-CON M) 20 MEQ tablet TAKE 2 TABLETS(40 MEQ) BY MOUTH DAILY 180 tablet 2   rosuvastatin (CRESTOR) 20 MG tablet TAKE 1 TABLET BY MOUTH EVERY DAY 90 tablet 1   sertraline (ZOLOFT) 50 MG tablet Take 1 tablet (50 mg total) by mouth daily. 30  tablet 2   traZODone (DESYREL) 50 MG tablet Take 1 tablet (50 mg total) by mouth at bedtime as needed. 90 tablet 3   tretinoin (RETIN-A) 0.05 % cream Apply 1 application topically at bedtime. Applied to face     warfarin (COUMADIN) 4 MG tablet TAKE 1 TABLET BY MOUTH EVERY DAY AS DIRECTED BY COUMADIN CLINIC 90 tablet 1   No current facility-administered medications for this visit.    Medication Side Effects: None  Allergies:  Allergies  Allergen Reactions   Codeine Anaphylaxis    Jerking, involuntary jerking    Dilantin [Phenytoin Sodium Extended] Rash and Itching   Metoprolol Other (See Comments)    depression    Propoxyphene Other (See Comments)    Bad vivid dreams    Past Medical History:  Diagnosis Date   Anxiety    Aortic stenosis    status post aortic valve replacement with St. 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   Depression    DJD (degenerative joint disease) of knee    Dyslipidemia    GERD (gastroesophageal reflux disease)    "several years ago; none since" (11/12/2012)   Hyperlipidemia    Hyperlipidemia LDL goal <70 01/19/2017   Hypertension    Left atrial enlargement    Mitral regurgitation 06/25/2016   Mild moderate MR by echo 06/2016   Obesity    Persistent atrial fibrillation (HCC)    s/p afib ablation x 2 at [facility]   Sleep apnea    On CPAP at 12cm H2O   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   Please see review of systems for further details on the patient's review from today.   Objective:   Physical Exam:  There were no vitals taken for this  visit.  Physical Exam Constitutional:      General: She is not in acute distress. Musculoskeletal:        General: No deformity.  Neurological:     Mental Status: She is alert and oriented to person, place, and time.     Coordination: Coordination normal.  Psychiatric:        Attention and Perception: Attention and perception normal. She does not perceive auditory or visual hallucinations.        Mood and Affect: Mood normal. Affect is not labile, blunt, angry or inappropriate.        Speech: Speech normal.  Behavior: Behavior normal.        Thought Content: Thought content normal. Thought content is not paranoid or delusional. Thought content does not include homicidal or suicidal ideation. Thought content does not include homicidal or suicidal plan.        Cognition and Memory: Cognition and memory normal.        Judgment: Judgment normal.     Comments: Insight intact     Lab Review:     Component Value Date/Time   NA 142 06/01/2022 1000   K 4.8 06/01/2022 1000   CL 100 06/01/2022 1000   CO2 29 06/01/2022 1000   GLUCOSE 97 06/01/2022 1000   GLUCOSE 91 04/12/2021 1816   BUN 21 06/01/2022 1000   CREATININE 1.00 06/01/2022 1000   CREATININE 0.82 12/28/2015 1000   CALCIUM 9.8 06/01/2022 1000   PROT 7.4 08/14/2020 1730   PROT 7.1 05/27/2018 1125   ALBUMIN 3.8 08/14/2020 1730   ALBUMIN 4.5 05/27/2018 1125   AST 26 08/14/2020 1730   ALT 19 08/14/2020 1730   ALKPHOS 63 08/14/2020 1730   BILITOT 0.9 08/14/2020 1730   BILITOT 0.4 05/27/2018 1125   GFRNONAA >60 04/12/2021 1816   GFRAA 82 02/05/2020 1120       Component Value Date/Time   WBC 4.9 04/12/2021 1816   RBC 4.00 04/12/2021 1816   HGB 12.6 04/12/2021 1816   HGB 11.7 09/28/2020 1017   HCT 37.3 04/12/2021 1816   HCT 34.6 09/28/2020 1017   PLT 192 04/12/2021 1816   PLT 207 09/28/2020 1017   MCV 93.3 04/12/2021 1816   MCV 92 09/28/2020 1017   MCH 31.5 04/12/2021 1816   MCHC 33.8 04/12/2021 1816   RDW  12.8 04/12/2021 1816   RDW 12.6 09/28/2020 1017   LYMPHSABS 0.9 09/28/2020 1017   MONOABS 0.6 08/18/2020 0338   EOSABS 0.2 09/28/2020 1017   BASOSABS 0.0 09/28/2020 1017    No results found for: "POCLITH", "LITHIUM"   No results found for: "PHENYTOIN", "PHENOBARB", "VALPROATE", "CBMZ"   .res Assessment: Plan:    Plan:  PDMP reviewed  Trazadone 50mg  for sleep Pristiq 100mg  daily  Wellbutrin XL 150mg  to 300mg  every morning - will use current supply - just picked up a new prescription. Denies seizure history.  Consider Zoloft 50mg  daily - did not start.  Time spent with patient was 15 minutes. Greater than 50% of face to face time with patient was spent on counseling and coordination of care.    RTC 4 weeks  Patient advised to contact office with any questions, adverse effects, or acute worsening in signs and symptoms.  There are no diagnoses linked to this encounter.   Please see After Visit Summary for patient specific instructions.  Future Appointments  Date Time Provider Department Center  11/06/2022 10:00 AM Shalamar Crays, Thereasa Solo, NP [facility] None  11/27/2022 10:00 AM [facility] None    No orders of the defined types were placed in this encounter.   -------------------------------

## 2022-11-27 ENCOUNTER — Ambulatory Visit: Payer: Medicare Other | Attending: Cardiology

## 2022-11-27 DIAGNOSIS — I480 Paroxysmal atrial fibrillation: Secondary | ICD-10-CM | POA: Diagnosis not present

## 2022-11-27 DIAGNOSIS — Z5181 Encounter for therapeutic drug level monitoring: Secondary | ICD-10-CM

## 2022-11-27 LAB — POCT INR: INR: 2.5 (ref 2.0–3.0)

## 2022-11-27 NOTE — Patient Instructions (Signed)
continue taking Warfarin 1 tablet daily. Recheck INR in 6 weeks. [facility] [phone]

## 2022-12-04 ENCOUNTER — Encounter: Payer: Self-pay | Admitting: Adult Health

## 2022-12-04 ENCOUNTER — Ambulatory Visit (INDEPENDENT_AMBULATORY_CARE_PROVIDER_SITE_OTHER): Payer: Medicare Other | Admitting: Adult Health

## 2022-12-04 DIAGNOSIS — G4700 Insomnia, unspecified: Secondary | ICD-10-CM

## 2022-12-04 DIAGNOSIS — F331 Major depressive disorder, recurrent, moderate: Secondary | ICD-10-CM | POA: Diagnosis not present

## 2022-12-04 MED ORDER — ARIPIPRAZOLE 2 MG PO TABS
2.0000 mg | ORAL_TABLET | Freq: Every day | ORAL | 2 refills | Status: DC
Start: 2022-12-04 — End: 2023-01-01

## 2022-12-04 MED ORDER — BUPROPION HCL ER (XL) 150 MG PO TB24: 300.0000 mg | ORAL_TABLET | Freq: Every day | ORAL | 1 refills | Status: DC

## 2022-12-04 NOTE — Progress Notes (Signed)
[name] [id number] 1945-07-19 77 y.o.  Subjective:   Patient ID:  [name] is a 77 y.o. (DOB 01-02-1946) female.  Chief Complaint: No chief complaint on file.   HPI [name] presents to the office today for follow-up of MDD.  Describes mood today as "not too good". Pleasant. Reports tearfulness - "a lot". Mood symptoms - reports   depression and irritability. Denies anxiety. Denies panic attacks. Reports decreased worry, rumination, and over thinking. Reports mood as "lower". Stating "I don't feel like I'm doing too good". Does not feel like current medication regimen is helpful - willing to consider other options for mood symptoms. Varying interest and motivation. Taking medications as prescribed.  Energy levels "not great". Active, walking on treadmill 20 minutes a day. Enjoys some usual interests and activities. Married. Lives with husband and 2 [place] terriers. Has a son, age 56. Has one step daughter - 23. Spending time with family. Attends church.  Appetite adequate. Weight stable - 186 pounds. Sleeps well most nights. Averages 8 hours. Denies daytime napping.   Focus and concentration stable. Completing tasks. Managing aspects of household. Retired - [employer]. Denies SI or HI.  Denies AH or VH. Denies self harm. Denies substance use.  Previous medication trials:  Xanax, Zoloft, Wellbutrin, Valium, Ambien, Lexapro    Flowsheet Row ED from 10/27/2022 in [facility] Ur[facility] Care at [facility]  ED from 08/13/2022 in [facility] Urgent Care at [facility]  ED from 04/13/2021 in [facility] Emergency Department at [facility]  C-SSRS RISK CATEGORY No Risk No Risk No Risk        Review of Systems:  Review of Systems  Musculoskeletal:  Negative for gait problem.  Neurological:  Negative for tremors.  Psychiatric/Behavioral:         Please refer to HPI    Medications: I have reviewed the patient's current medications.  Current Outpatient  Medications  Medication Sig Dispense Refill   buPROPion (WELLBUTRIN XL) 300 MG 24 hr tablet Take 1 tablet (300 mg total) by mouth daily. 30 tablet 5   calcium-vitamin D (OSCAL WITH D) 500-200 MG-UNIT per tablet Take 2 tablets by mouth in the morning.     carvedilol (COREG) 3.125 MG tablet TAKE 1 TABLET BY MOUTH TWICE A DAY WITH MEALS 180 tablet 1   Cholecalciferol (VITAMIN D) 2000 UNITS tablet Take 4,000 Units by mouth in the morning.     desvenlafaxine (PRISTIQ) 100 MG 24 hr tablet Take 1 tablet (100 mg total) by mouth daily. 90 tablet 3   digoxin (LANOXIN) 0.125 MG tablet Take 1 tablet (125 mcg total) by mouth daily. Patient needs to keep upcoming appointment for further refills. 90 tablet 0   ENTRESTO 97-103 MG TAKE 1 TABLET BY MOUTH TWICE A DAY 180 tablet 1   famotidine (PEPCID) 10 MG tablet Take 10 mg by mouth as needed for heartburn or indigestion.     ferrous sulfate 325 (65 FE) MG tablet Take 325 mg by mouth in the morning.     furosemide (LASIX) 80 MG tablet Take 1 tablet by mouth daily. OKAY TO TAKE EXTRA FOR WEIGHT GAIN> 3LB/ DAILY OR 5LB/WEEKLY 180 tablet 3   levothyroxine (SYNTHROID, LEVOTHROID) 100 MCG tablet Take 100 mcg daily before breakfast by mouth.     methylPREDNISolone (MEDROL DOSEPAK) 4 MG TBPK tablet Take as directed 21 tablet 0   Multiple Vitamin (MULTIVITAMIN WITH MINERALS) TABS Take 1 tablet by mouth in the morning.  oxyCODONE-acetaminophen (PERCOCET/ROXICET) 5-325 MG tablet Take 1 tablet by mouth every 6 (six) hours as needed for severe pain. 15 tablet 0   oxymetazoline (AFRIN) 0.05 % nasal spray Place 1 spray into both nostrils daily as needed for congestion.     potassium chloride SA (KLOR-CON M) 20 MEQ tablet TAKE 2 TABLETS(40 MEQ) BY MOUTH DAILY 180 tablet 2   rosuvastatin (CRESTOR) 20 MG tablet TAKE 1 TABLET BY MOUTH EVERY DAY 90 tablet 1   traZODone (DESYREL) 50 MG tablet Take 1 tablet (50 mg total) by mouth at bedtime as needed. 90 tablet 3   tretinoin  (RETIN-A) 0.05 % cream Apply 1 application topically at bedtime. Applied to face     warfarin (COUMADIN) 4 MG tablet TAKE 1 TABLET BY MOUTH EVERY DAY AS DIRECTED BY COUMADIN CLINIC 90 tablet 1   No current facility-administered medications for this visit.    Medication Side Effects: None  Allergies:  Allergies  Allergen Reactions   Codeine Anaphylaxis    Jerking, involuntary jerking    Dilantin [Phenytoin Sodium Extended] Rash and Itching   Metoprolol Other (See Comments)    depression    Propoxyphene Other (See Comments)    Bad vivid dreams    Past Medical History:  Diagnosis Date   Anxiety    Aortic stenosis    status post aortic valve replacement with St. 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   Depression    DJD (degenerative joint disease) of knee    Dyslipidemia    GERD (gastroesophageal reflux disease)    "several years ago; none since" (11/12/2012)   Hyperlipidemia    Hyperlipidemia LDL goal <70 01/19/2017   Hypertension    Left atrial enlargement    Mitral regurgitation 06/25/2016   Mild moderate MR by echo 06/2016   Obesity    Persistent atrial fibrillation (HCC)    s/p afib ablation x 2 at [facility]   Sleep apnea    On CPAP at 12cm H2O   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   Please see review of systems for further details on the patient's review from today.   Objective:   Physical Exam:  There were no vitals taken for this visit.  Physical Exam Constitutional:      General: She is not in acute distress. Musculoskeletal:        General: No deformity.   Neurological:     Mental Status: She is alert and oriented to person, place, and time.     Coordination: Coordination normal.  Psychiatric:        Attention and Perception: Attention and perception normal. She does not perceive auditory or visual hallucinations.        Mood and Affect: Mood normal. Mood is not anxious or depressed. Affect is not labile, blunt, angry or inappropriate.        Speech: Speech normal.        Behavior: Behavior normal.        Thought Content: Thought content normal. Thought content is not paranoid or delusional. Thought content does not include  homicidal or suicidal ideation. Thought content does not include homicidal or suicidal plan.        Cognition and Memory: Cognition and memory normal.        Judgment: Judgment normal.     Comments: Insight intact     Lab Review:     Component Value Date/Time   NA 142 06/01/2022 1000   K 4.8 06/01/2022 1000   CL 100 06/01/2022 1000   CO2 29 06/01/2022 1000   GLUCOSE 97 06/01/2022 1000   GLUCOSE 91 04/12/2021 1816   BUN 21 06/01/2022 1000   CREATININE 1.00 06/01/2022 1000   CREATININE 0.82 12/28/2015 1000   CALCIUM 9.8 06/01/2022 1000   PROT 7.4 08/14/2020 1730   PROT 7.1 05/27/2018 1125   ALBUMIN 3.8 08/14/2020 1730   ALBUMIN 4.5 05/27/2018 1125   AST 26 08/14/2020 1730   ALT 19 08/14/2020 1730   ALKPHOS 63 08/14/2020 1730   BILITOT 0.9 08/14/2020 1730   BILITOT 0.4 05/27/2018 1125   GFRNONAA >60 04/12/2021 1816   GFRAA 82 02/05/2020 1120       Component Value Date/Time   WBC 4.9 04/12/2021 1816   RBC 4.00 04/12/2021 1816   HGB 12.6 04/12/2021 1816   HGB 11.7 09/28/2020 1017   HCT 37.3 04/12/2021 1816   HCT 34.6 09/28/2020 1017   PLT 192 04/12/2021 1816   PLT 207 09/28/2020 1017   MCV 93.3 04/12/2021 1816   MCV 92 09/28/2020 1017   MCH 31.5 04/12/2021 1816   MCHC 33.8 04/12/2021 1816   RDW 12.8 04/12/2021 1816   RDW 12.6 09/28/2020 1017   LYMPHSABS 0.9 09/28/2020 1017   MONOABS 0.6  08/18/2020 0338   EOSABS 0.2 09/28/2020 1017   BASOSABS 0.0 09/28/2020 1017    No results found for: "POCLITH", "LITHIUM"   No results found for: "PHENYTOIN", "PHENOBARB", "VALPROATE", "CBMZ"   .res Assessment: Plan:    Plan:  PDMP reviewed  Trazadone 50mg  for sleep Pristiq 100mg  daily  Decrease Wellbutrin XL 300mg  to 150mg  every morning. Denies seizure history.  Add Abilify 2mg  daily  Time spent with patient was 15 minutes. Greater than 50% of face to face time with patient was spent on counseling and coordination of care.    RTC 4 weeks  Patient advised to contact office with any questions, adverse effects, or acute worsening in signs and symptoms.  There are no diagnoses linked to this encounter.   Please see After Visit Summary for patient specific instructions.  Future Appointments  Date Time Provider Department Center  12/04/2022 11:40 AM Markiah Janeway, Thereasa Solo, NP [facility] None  01/08/2023 10:00 AM [facility] None  02/01/2023 10:30 AM [id number]    No orders of the defined types were placed in this encounter.   -------------------------------

## 2022-12-05 ENCOUNTER — Other Ambulatory Visit: Payer: Self-pay

## 2022-12-05 DIAGNOSIS — F331 Major depressive disorder, recurrent, moderate: Secondary | ICD-10-CM

## 2022-12-05 MED ORDER — BUPROPION HCL ER (XL) 150 MG PO TB24
150.0000 mg | ORAL_TABLET | Freq: Every day | ORAL | 1 refills | Status: DC
Start: 2022-12-05 — End: 2023-01-01

## 2022-12-07 ENCOUNTER — Other Ambulatory Visit: Payer: Self-pay | Admitting: Cardiovascular Disease

## 2022-12-11 DIAGNOSIS — L820 Inflamed seborrheic keratosis: Secondary | ICD-10-CM | POA: Diagnosis not present

## 2022-12-11 DIAGNOSIS — Z85828 Personal history of other malignant neoplasm of skin: Secondary | ICD-10-CM | POA: Diagnosis not present

## 2022-12-11 DIAGNOSIS — L821 Other seborrheic keratosis: Secondary | ICD-10-CM | POA: Diagnosis not present

## 2022-12-14 ENCOUNTER — Other Ambulatory Visit: Payer: Self-pay | Admitting: Cardiovascular Disease

## 2022-12-14 DIAGNOSIS — I480 Paroxysmal atrial fibrillation: Secondary | ICD-10-CM

## 2022-12-14 NOTE — Telephone Encounter (Signed)
Prescription refill request received for warfarin Lov: 06/06/22 ([facility])  Next INR check: 01/08/23 Warfarin tablet strength: 4mg   Appropriate dose. Refill sent.

## 2023-01-01 ENCOUNTER — Encounter: Payer: Self-pay | Admitting: Adult Health

## 2023-01-01 ENCOUNTER — Ambulatory Visit: Payer: Medicare Other | Admitting: Adult Health

## 2023-01-01 DIAGNOSIS — F331 Major depressive disorder, recurrent, moderate: Secondary | ICD-10-CM

## 2023-01-01 DIAGNOSIS — G4700 Insomnia, unspecified: Secondary | ICD-10-CM | POA: Diagnosis not present

## 2023-01-01 MED ORDER — ARIPIPRAZOLE 2 MG PO TABS
2.0000 mg | ORAL_TABLET | Freq: Every day | ORAL | 3 refills | Status: DC
Start: 2023-01-01 — End: 2023-03-04

## 2023-01-01 MED ORDER — BUPROPION HCL ER (XL) 150 MG PO TB24
150.0000 mg | ORAL_TABLET | Freq: Every day | ORAL | 3 refills | Status: DC
Start: 2023-01-01 — End: 2023-09-30

## 2023-01-01 NOTE — Progress Notes (Signed)
[name] [id number] 27-Jun-1945 77 y.o.  Subjective:   Patient ID:  [name] is a 77 y.o. (DOB [birth year]) female.  Chief Complaint: No chief complaint on file.   HPI [name] presents to the office today for follow-up of MDD.  Describes mood today as "ok". Pleasant. Reports decreased tearfulness. Mood symptoms - reports   decreased depression and irritability. Denies anxiety. Denies panic attacks. Denies worry, rumination, and over thinking. Reports mood has improved. Stating "I feel like I'm doing better". Feels like the addition of Abilify has been helpful. Varying interest and motivation. Taking medications as prescribed.  Energy levels stable. Active, walking on treadmill 20 minutes a day. Enjoys some usual interests and activities. Married. Lives with husband and 2 [place] terriers. Has a son, age 38. Has one step daughter - 39. Spending time with family. Attends church.  Appetite adequate. Weight stable - 186 pounds. Sleeps well most nights. Averages 8 hours. Denies daytime napping.   Focus and concentration stable. Completing tasks. Managing aspects of household. Retired - [employer]. Denies SI or HI.  Denies AH or VH. Denies self harm. Denies substance use.  Previous medication trials:  Xanax, Zoloft, Wellbutrin, Valium, Ambien, Lexapro   Flowsheet Row ED from 10/27/2022 in [facility] at [facility]  ED from 08/13/2022 in [facility] at [facility]  ED from 04/13/2021 in [facility] Department at [facility]  C-SSRS RISK CATEGORY No Risk No Risk No Risk        Review of Systems:  Review of Systems  Musculoskeletal:  Negative for gait problem.  Neurological:  Negative for tremors.  Psychiatric/Behavioral:         Please refer to HPI    Medications: I have reviewed the patient's current medications.  Current Outpatient Medications  Medication Sig Dispense Refill   ARIPiprazole (ABILIFY) 2 MG tablet  Take 1 tablet (2 mg total) by mouth daily. 90 tablet 3   buPROPion (WELLBUTRIN XL) 150 MG 24 hr tablet Take 1 tablet (150 mg total) by mouth daily. 90 tablet 3   calcium-vitamin D (OSCAL WITH D) 500-200 MG-UNIT per tablet Take 2 tablets by mouth in the morning.     carvedilol (COREG) 3.125 MG tablet Take 1 tablet (3.125 mg total) by mouth 2 (two) times daily with a meal. 180 tablet 1   Cholecalciferol (VITAMIN D) 2000 UNITS tablet Take 4,000 Units by mouth in the morning.     desvenlafaxine (PRISTIQ) 100 MG 24 hr tablet Take 1 tablet (100 mg total) by mouth daily. 90 tablet 3   digoxin (LANOXIN) 0.125 MG tablet Take 1 tablet (0.125 mg total) by mouth daily. 90 tablet 1   ENTRESTO 97-103 MG TAKE 1 TABLET BY MOUTH TWICE A DAY 180 tablet 1   famotidine (PEPCID) 10 MG tablet Take 10 mg by mouth as needed for heartburn or indigestion.     ferrous sulfate 325 (65 FE) MG tablet Take 325 mg by mouth in the morning.     furosemide (LASIX) 80 MG tablet Take 1 tablet by mouth daily. OKAY TO TAKE EXTRA FOR WEIGHT GAIN> 3LB/ DAILY OR 5LB/WEEKLY 180 tablet 3   levothyroxine (SYNTHROID, LEVOTHROID) 100 MCG tablet Take 100 mcg daily before breakfast by mouth.     methylPREDNISolone (MEDROL DOSEPAK) 4 MG TBPK tablet Take as directed 21 tablet 0   Multiple Vitamin (MULTIVITAMIN WITH MINERALS) TABS Take 1 tablet by mouth in the morning.     oxyCODONE-acetaminophen (PERCOCET/ROXICET)  5-325 MG tablet Take 1 tablet by mouth every 6 (six) hours as needed for severe pain. 15 tablet 0   oxymetazoline (AFRIN) 0.05 % nasal spray Place 1 spray into both nostrils daily as needed for congestion.     potassium chloride SA (KLOR-CON M) 20 MEQ tablet TAKE 2 TABLETS(40 MEQ) BY MOUTH DAILY 180 tablet 2   rosuvastatin (CRESTOR) 20 MG tablet TAKE 1 TABLET BY MOUTH EVERY DAY 90 tablet 1   traZODone (DESYREL) 50 MG tablet Take 1 tablet (50 mg total) by mouth at bedtime as needed. 90 tablet 3   tretinoin (RETIN-A) 0.05 % cream Apply 1  application topically at bedtime. Applied to face     warfarin (COUMADIN) 4 MG tablet TAKE 1 TABLET BY MOUTH EVERY DAY AS DIRECTED BY COUMADIN CLINIC 90 tablet 0   No current facility-administered medications for this visit.    Medication Side Effects: None  Allergies:  Allergies  Allergen Reactions   Codeine Anaphylaxis    Jerking, involuntary jerking    Dilantin [Phenytoin Sodium Extended] Rash and Itching   Metoprolol Other (See Comments)    depression    Propoxyphene Other (See Comments)    Bad vivid dreams    Past Medical History:  Diagnosis Date   Anxiety    Aortic stenosis    status post aortic valve replacement with St. 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   Depression    DJD (degenerative joint disease) of knee    Dyslipidemia    GERD (gastroesophageal reflux disease)    "several years ago; none since" (11/12/2012)   Hyperlipidemia    Hyperlipidemia LDL goal <70 01/19/2017   Hypertension    Left atrial enlargement    Mitral regurgitation 06/25/2016   Mild moderate MR by echo 06/2016   Obesity    Persistent atrial fibrillation (HCC)    s/p afib ablation x 2 at [facility]   Sleep apnea    On CPAP at 12cm H2O   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   Please see review of systems for further details on the patient's review from today.   Objective:   Physical Exam:  There were no vitals taken for this visit.  Physical Exam Constitutional:      General: She is not in acute distress. Musculoskeletal:        General: No deformity.  Neurological:     Mental Status: She  is alert and oriented to person, place, and time.     Coordination: Coordination normal.  Psychiatric:        Attention and Perception: Attention and perception normal. She does not perceive auditory or visual hallucinations.        Mood and Affect: Affect is not labile, blunt, angry or inappropriate.        Speech: Speech normal.        Behavior: Behavior normal.        Thought Content: Thought content normal. Thought content is not paranoid or delusional. Thought content does not include homicidal or suicidal ideation. Thought content does not include homicidal  or suicidal plan.        Cognition and Memory: Cognition and memory normal.        Judgment: Judgment normal.     Comments: Insight intact     Lab Review:     Component Value Date/Time   NA 142 06/01/2022 1000   K 4.8 06/01/2022 1000   CL 100 06/01/2022 1000   CO2 29 06/01/2022 1000   GLUCOSE 97 06/01/2022 1000   GLUCOSE 91 04/12/2021 1816   BUN 21 06/01/2022 1000   CREATININE 1.00 06/01/2022 1000   CREATININE 0.82 12/28/2015 1000   CALCIUM 9.8 06/01/2022 1000   PROT 7.4 08/14/2020 1730   PROT 7.1 05/27/2018 1125   ALBUMIN 3.8 08/14/2020 1730   ALBUMIN 4.5 05/27/2018 1125   AST 26 08/14/2020 1730   ALT 19 08/14/2020 1730   ALKPHOS 63 08/14/2020 1730   BILITOT 0.9 08/14/2020 1730   BILITOT 0.4 05/27/2018 1125   GFRNONAA >60 04/12/2021 1816   GFRAA 82 02/05/2020 1120       Component Value Date/Time   WBC 4.9 04/12/2021 1816   RBC 4.00 04/12/2021 1816   HGB 12.6 04/12/2021 1816   HGB 11.7 09/28/2020 1017   HCT 37.3 04/12/2021 1816   HCT 34.6 09/28/2020 1017   PLT 192 04/12/2021 1816   PLT 207 09/28/2020 1017   MCV 93.3 04/12/2021 1816   MCV 92 09/28/2020 1017   MCH 31.5 04/12/2021 1816   MCHC 33.8 04/12/2021 1816   RDW 12.8 04/12/2021 1816   RDW 12.6 09/28/2020 1017   LYMPHSABS 0.9 09/28/2020 1017   MONOABS 0.6 08/18/2020 0338   EOSABS 0.2 09/28/2020 1017   BASOSABS 0.0 09/28/2020 1017    No results  found for: "POCLITH", "LITHIUM"   No results found for: "PHENYTOIN", "PHENOBARB", "VALPROATE", "CBMZ"   .res Assessment: Plan:    Plan:  PDMP reviewed  Trazadone 50mg  for sleep Pristiq 100mg  daily  Wellbutrin XL 150mg  every morning. Denies seizure history. Abilify 2mg  daily  RTC 8 weeks  Patient advised to contact office with any questions, adverse effects, or acute worsening in signs and symptoms.  Discussed potential metabolic side effects associated with atypical antipsychotics, as well as potential risk for movement side effects. Advised pt to contact office if movement side effects occur.    Diagnoses and all orders for this visit:  Major depressive disorder, recurrent episode, moderate (HCC) -     buPROPion (WELLBUTRIN XL) 150 MG 24 hr tablet; Take 1 tablet (150 mg total) by mouth daily. -     ARIPiprazole (ABILIFY) 2 MG tablet; Take 1 tablet (2 mg total) by mouth daily.  Insomnia, unspecified type     Please see After Visit Summary for patient specific instructions.  Future Appointments  Date Time Provider Department Center  01/08/2023 10:00 AM [facility] None  02/01/2023 10:30 AM [id number]    No orders of the defined types were placed in this encounter.   -------------------------------

## 2023-01-03 ENCOUNTER — Encounter: Payer: Self-pay | Admitting: Cardiovascular Disease

## 2023-01-08 ENCOUNTER — Ambulatory Visit: Payer: Medicare Other | Attending: Cardiology | Admitting: *Deleted

## 2023-01-08 DIAGNOSIS — I480 Paroxysmal atrial fibrillation: Secondary | ICD-10-CM | POA: Diagnosis not present

## 2023-01-08 DIAGNOSIS — Z5181 Encounter for therapeutic drug level monitoring: Secondary | ICD-10-CM | POA: Diagnosis not present

## 2023-01-08 LAB — POCT INR: INR: 2.3 (ref 2.0–3.0)

## 2023-01-08 NOTE — Patient Instructions (Signed)
Description   Continue taking Warfarin 1 tablet daily.  Recheck INR in 6 weeks.  Coumadin Clinic [phone]      

## 2023-01-27 ENCOUNTER — Other Ambulatory Visit: Payer: Self-pay | Admitting: Cardiovascular Disease

## 2023-01-30 DIAGNOSIS — I10 Essential (primary) hypertension: Secondary | ICD-10-CM | POA: Diagnosis not present

## 2023-01-30 DIAGNOSIS — Z79899 Other long term (current) drug therapy: Secondary | ICD-10-CM | POA: Diagnosis not present

## 2023-01-30 DIAGNOSIS — I4821 Permanent atrial fibrillation: Secondary | ICD-10-CM | POA: Diagnosis not present

## 2023-01-30 DIAGNOSIS — Z0000 Encounter for general adult medical examination without abnormal findings: Secondary | ICD-10-CM | POA: Diagnosis not present

## 2023-01-30 DIAGNOSIS — Z23 Encounter for immunization: Secondary | ICD-10-CM | POA: Diagnosis not present

## 2023-01-30 DIAGNOSIS — E042 Nontoxic multinodular goiter: Secondary | ICD-10-CM | POA: Diagnosis not present

## 2023-01-30 DIAGNOSIS — M179 Osteoarthritis of knee, unspecified: Secondary | ICD-10-CM | POA: Diagnosis not present

## 2023-01-30 DIAGNOSIS — Z7901 Long term (current) use of anticoagulants: Secondary | ICD-10-CM | POA: Diagnosis not present

## 2023-01-30 DIAGNOSIS — F331 Major depressive disorder, recurrent, moderate: Secondary | ICD-10-CM | POA: Diagnosis not present

## 2023-01-30 DIAGNOSIS — I7121 Aneurysm of the ascending aorta, without rupture: Secondary | ICD-10-CM | POA: Diagnosis not present

## 2023-01-30 DIAGNOSIS — I129 Hypertensive chronic kidney disease with stage 1 through stage 4 chronic kidney disease, or unspecified chronic kidney disease: Secondary | ICD-10-CM | POA: Diagnosis not present

## 2023-01-30 DIAGNOSIS — E039 Hypothyroidism, unspecified: Secondary | ICD-10-CM | POA: Diagnosis not present

## 2023-01-30 DIAGNOSIS — N1831 Chronic kidney disease, stage 3a: Secondary | ICD-10-CM | POA: Diagnosis not present

## 2023-02-01 ENCOUNTER — Ambulatory Visit (HOSPITAL_COMMUNITY): Payer: Medicare Other | Attending: Cardiovascular Disease

## 2023-02-01 DIAGNOSIS — I340 Nonrheumatic mitral (valve) insufficiency: Secondary | ICD-10-CM | POA: Diagnosis not present

## 2023-02-01 LAB — ECHOCARDIOGRAM COMPLETE
AV Mean grad: 7 mm[Hg]
AV Peak grad: 11.8 mm[Hg]
Ao pk vel: 1.72 m/s
Est EF: 55
S' Lateral: 4.1 cm

## 2023-02-04 ENCOUNTER — Ambulatory Visit: Payer: Medicare Other | Attending: Cardiovascular Disease | Admitting: Cardiovascular Disease

## 2023-02-04 ENCOUNTER — Encounter: Payer: Self-pay | Admitting: Cardiovascular Disease

## 2023-02-04 VITALS — BP 107/62 | HR 78 | Ht 64.5 in | Wt 189.0 lb

## 2023-02-04 DIAGNOSIS — Z79899 Other long term (current) drug therapy: Secondary | ICD-10-CM | POA: Diagnosis not present

## 2023-02-04 DIAGNOSIS — I340 Nonrheumatic mitral (valve) insufficiency: Secondary | ICD-10-CM | POA: Diagnosis not present

## 2023-02-04 DIAGNOSIS — D6869 Other thrombophilia: Secondary | ICD-10-CM

## 2023-02-04 DIAGNOSIS — Z5181 Encounter for therapeutic drug level monitoring: Secondary | ICD-10-CM | POA: Diagnosis not present

## 2023-02-04 DIAGNOSIS — I4821 Permanent atrial fibrillation: Secondary | ICD-10-CM

## 2023-02-04 DIAGNOSIS — I77810 Thoracic aortic ectasia: Secondary | ICD-10-CM

## 2023-02-04 DIAGNOSIS — I10 Essential (primary) hypertension: Secondary | ICD-10-CM

## 2023-02-04 DIAGNOSIS — G4733 Obstructive sleep apnea (adult) (pediatric): Secondary | ICD-10-CM

## 2023-02-04 DIAGNOSIS — I5032 Chronic diastolic (congestive) heart failure: Secondary | ICD-10-CM

## 2023-02-04 DIAGNOSIS — J984 Other disorders of lung: Secondary | ICD-10-CM

## 2023-02-04 DIAGNOSIS — I700 Atherosclerosis of aorta: Secondary | ICD-10-CM

## 2023-02-04 DIAGNOSIS — E7800 Pure hypercholesterolemia, unspecified: Secondary | ICD-10-CM | POA: Diagnosis not present

## 2023-02-04 NOTE — Progress Notes (Signed)
Cardiology Office Note:    Date:  02/04/2023   ID:  [name], DOB [birth year], MRN [id number]  PCP:  Patient, No Pcp Per  [facility] Cardiologist:  [facility] Electrophysiologist:  None   Referring MD: No ref. provider found   Chief Complaint  Patient presents with   Cardiac Valve Problem     History of Present Illness:    [name] is a 77 y.o. 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  She has been doing quite well over the last year.  She feels that her breathing is good and that her stamina has improved.  She has been slowly losing weight and has actually lost about 30 pounds from her peak weight of 220 pounds.  Trying to get down to 150 pounds gradually.  She has definitely felt better after we started using digoxin for improved ventricular rate control.  Last check digoxin level acceptable (0.8) in February.  ECG today shows atrial fibrillation with good ventricular rate control.  She has not had any episodes of bleeding or any falls or injuries.  INR has been within therapeutic range for the last 3 months, mostly C2.3 earlier this month.  Her follow-up echocardiogram shows normal left ventricular size and systolic function and normal function of the aortic valve prosthesis, continues to show what I considered to be moderate-severe mitral  insufficiency (central jet, systolic pulmonary vein flow is still antegrade), although it was interpreted as being severe mitral insufficiency on the official report.  The systolic pulmonary artery pressure is unchanged around 35 mmHg.  Previously poorly tolerant of metoprolol, which seemed to cause depression, so used carvedilol.    She was most recently hospitalized in June 2021 with acute heart failure exacerbation and her echocardiogram showed decreased LVEF to 30-35% as well as moderately reduced right ventricular systolic function and severely elevated pulmonary artery hypertension, despite normal function of the aortic valve prosthesis.  The cause of LVEF decline was uncertain, but she responded well to discontinuation of diltiazem and treatment with diuretics, Entresto, carvedilol (the doses have been limited by low blood pressure).  In the past she reportedly did not tolerate beta-blockers due to depression, but the current low-dose of carvedilol seems to be working well for her. Follow-up echocardiogram performed in October 2021 showed only partial improvement in LVEF now up to 40-45% in the left ventricle remain dilated with an end-systolic diameter of 49 mm.  Mitral regurgitation remains severe with a central jet.  She underwent a TEE on 07/12/2020 that shows further improvement in LVEF which is now almost normal at 50-55%.  It also showed that the mitral regurgitation is central and squarely in the moderate to severe range (there was no reversal of flow in the pulmonary veins, all calculated parameters suggested higher and of moderate MR).  Anatomically, the valve appears appropriate for MitraClip, without major leaflet retraction and with a mean gradient of 3 mmHg at a heart rate around 100 bpm.  She underwent right and left  heart catheterization on 10/14/2020.  This showed excellent hemodynamic values and normal pulmonary artery pressure.  The PAWP was only 10 mmHg and the V wave was not  prominent.  It appeared that she did not require treatment for the mitral insufficiency at that point.  Follow-up transthoracic echocardiogram performed 02/07/2021 shows complete normalization of LVEF to 55%, moderate to severe mitral regurgitation, normal parameters of the mechanical aortic valve prosthesis with a mean gradient of 10 mmHg.  Pulmonary function test performed in May 2021 were abnormal with restrictive findings: FEV1 of 39% of predicted, commensurate FVC 40% of predicted.  Her pulmonary specialist is Dr. Wynona Neat. She reports compliance with CPAP.    Past Medical History:  Diagnosis Date   Anxiety    Aortic stenosis    status post aortic valve replacement with St. 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   Depression    DJD (degenerative joint disease) of knee    Dyslipidemia    GERD (gastroesophageal reflux disease)    "several years ago; none since" (11/12/2012)   Hyperlipidemia    Hyperlipidemia LDL goal <70 01/19/2017   Hypertension    Left atrial enlargement    Mitral regurgitation 06/25/2016   Mild moderate MR by echo 06/2016   Obesity    Persistent atrial fibrillation (HCC)    s/p afib ablation x 2 at [facility]   Sleep apnea    On CPAP at 12cm H2O   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 Jude AVR   CARDIOVERSION N/A 07/22/2012   Procedure: CARDIOVERSION;  Surgeon: Donato Schultz, MD;  Location: [facility] ENDOSCOPY;  Service: Cardiovascular;  Laterality: N/A;   CARDIOVERSION N/A 11/25/2012   Procedure: CARDIOVERSION;  Surgeon: Quintella Reichert, MD;  Location: [facility] ENDOSCOPY;   Service: Cardiovascular;  Laterality: N/A;   CARDIOVERSION N/A 12/30/2012   Procedure: CARDIOVERSION;  Surgeon: Quintella Reichert, MD;  Location: [facility] ENDOSCOPY;  Service: Cardiovascular;  Laterality: N/A;  h&p in file-HW    CARDIOVERSION N/A 03/30/2013   Procedure: CARDIOVERSION;  Surgeon: Quintella Reichert, MD;  Location: [facility] ENDOSCOPY;  Service: Cardiovascular;  Laterality: N/A;   ELECTROPHYSIOLOGIC STUDY  11/2013   Afib ablation x 2 (11/2013 and 10/2015) at [facility] by Dr Smith Robert with recurrence post ablation   KNEE ARTHROSCOPY Left 1980's   "2" (11/12/2012)   KNEE SURGERY Left 1980's   "after 2 scopes they went in and did some kind of OR" (11/12/2012)   RIGHT/LEFT HEART CATH AND CORONARY ANGIOGRAPHY N/A 10/14/2020   Procedure: RIGHT/LEFT HEART CATH AND CORONARY ANGIOGRAPHY;  Surgeon: [place], Peter M, MD;  Location: [facility] INVASIVE CV LAB;  Service: Cardiovascular;  Laterality: N/A;   TEE WITHOUT CARDIOVERSION N/A 07/22/2012   Procedure: TRANSESOPHAGEAL ECHOCARDIOGRAM (TEE);  Surgeon: Donato Schultz, MD;  Location: [facility] ENDOSCOPY;  Service: Cardiovascular;  Laterality: N/A;  Rm 2034   TEE WITHOUT CARDIOVERSION N/A 10/07/2013   Procedure: TRANSESOPHAGEAL ECHOCARDIOGRAM (TEE);  Surgeon: Donato Schultz, MD;  Location: [facility] ENDOSCOPY;  Service: Cardiovascular;  Laterality: N/A;   TEE WITHOUT CARDIOVERSION N/A  07/12/2020   Procedure: TRANSESOPHAGEAL ECHOCARDIOGRAM (TEE);  Surgeon: Thurmon Fair, MD;  Location[facility] ENDOSCOPY;  Service: Cardiovascular;  Laterality: N/A;   TOTAL KNEE ARTHROPLASTY Left 11/05/2014   Procedure: TOTAL KNEE ARTHROPLASTY;  Surgeon: Jodi Geralds, MD;  Location: [facility] OR;  Service: Orthopedics;  Laterality: Left;   TUBAL LIGATION  1980's    Current Medications: Current Meds  Medication Sig   acetaminophen (TYLENOL) 500 MG tablet Take 500 mg by mouth as needed.   ARIPiprazole (ABILIFY) 2 MG tablet Take 1 tablet (2 mg total) by mouth daily.   buPROPion (WELLBUTRIN XL) 150 MG 24 hr tablet Take 1 tablet (150 mg  total) by mouth daily.   calcium-vitamin D (OSCAL WITH D) 500-200 MG-UNIT per tablet Take 2 tablets by mouth in the morning.   carvedilol (COREG) 3.125 MG tablet Take 1 tablet (3.125 mg total) by mouth 2 (two) times daily with a meal.   Cholecalciferol (VITAMIN D) 2000 UNITS tablet Take 4,000 Units by mouth in the morning.   desvenlafaxine (PRISTIQ) 100 MG 24 hr tablet Take 1 tablet (100 mg total) by mouth daily.   digoxin (LANOXIN) 0.125 MG tablet Take 1 tablet (0.125 mg total) by mouth daily.   ENTRESTO 97-103 MG TAKE 1 TABLET BY MOUTH TWICE A DAY   famotidine (PEPCID) 10 MG tablet Take 10 mg by mouth as needed for heartburn or indigestion.   ferrous sulfate 325 (65 FE) MG tablet Take 325 mg by mouth in the morning.   furosemide (LASIX) 80 MG tablet Take 1 tablet by mouth daily. OKAY TO TAKE EXTRA FOR WEIGHT GAIN> 3LB/ DAILY OR 5LB/WEEKLY   levothyroxine (SYNTHROID, LEVOTHROID) 100 MCG tablet Take 100 mcg daily before breakfast by mouth.   methylPREDNISolone (MEDROL DOSEPAK) 4 MG TBPK tablet Take as directed   Multiple Vitamin (MULTIVITAMIN WITH MINERALS) TABS Take 1 tablet by mouth in the morning.   oxyCODONE-acetaminophen (PERCOCET/ROXICET) 5-325 MG tablet Take 1 tablet by mouth every 6 (six) hours as needed for severe pain.   oxymetazoline (AFRIN) 0.05 % nasal spray Place 1 spray into both nostrils daily as needed for congestion.   polyethylene glycol (MIRALAX / GLYCOLAX) 17 g packet Take 17 g by mouth as needed.   potassium chloride SA (KLOR-CON M) 20 MEQ tablet TAKE 2 TABLETS(40 MEQ) BY MOUTH DAILY   rosuvastatin (CRESTOR) 20 MG tablet TAKE 1 TABLET BY MOUTH EVERY DAY   traZODone (DESYREL) 50 MG tablet Take 1 tablet (50 mg total) by mouth at bedtime as needed.   tretinoin (RETIN-A) 0.05 % cream Apply 1 application topically at bedtime. Applied to face   warfarin (COUMADIN) 4 MG tablet TAKE 1 TABLET BY MOUTH EVERY DAY AS DIRECTED BY COUMADIN CLINIC     Allergies:   Codeine, Dilantin  [phenytoin sodium extended], Metoprolol, and Propoxyphene   Social History   Socioeconomic History   Marital status: Married    Spouse name: Not on file   Number of children: Not on file   Years of education: Not on file   Highest education level: Not on file  Occupational History   Not on file  Tobacco Use   Smoking status: Never   Smokeless tobacco: Never   Tobacco comments:    Never smoke 06/22/21  Substance and Sexual Activity   Alcohol use: No    Comment: 11/12/2012 "no alcohol in the last 9 years"   Drug use: No   Sexual activity: Not Currently  Other Topics Concern   Not on file  Social History Narrative   Pt lives in [place] with spouse.  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 [employer] Insecurity: Not on file  Transportation Needs: Not on file  Physical Activity: Inactive (09/30/2019)   Exercise Vital Sign    Days of Exercise per Week: 0 days    Minutes of Exercise per Session: 0 min  Stress: Not on file  Social Connections: Unknown (09/30/2019)   Social Connection and Isolation Panel [NHANES]    Frequency of Communication with Friends and Family: Once a week    Frequency of Social Gatherings with Friends and Family: Not on file    Attends Religious Services: 1 to 4 times per year    Active Member of [employer] or Organizations: No    Attends Engineer, structural: Never    Marital Status: Married     Family History: The patient's family history includes Heart disease in her brother; Hyperlipidemia in her father; Hypertension in her father and mother; Lung cancer in her mother.  ROS:   Please see the history of present illness.     All other systems reviewed and are negative.  EKGs/Labs/Other Studies Reviewed:    The following studies were reviewed today: Transthoracic echo 02/01/2023   1. Left ventricular ejection fraction, by estimation, is 55%. The left  ventricle has normal function. The left ventricle  has no regional wall  motion abnormalities. Left ventricular diastolic parameters are  indeterminate.   2. Right ventricular systolic function is mildly reduced. The right  ventricular size is normal. There is mildly elevated pulmonary artery  systolic pressure. The estimated right ventricular systolic pressure is  38.0 mmHg.   3. Left atrial size was severely dilated.   4. Right atrial size was severely dilated.   5. The mitral valve is degenerative. Severe mitral valve regurgitation,  possible atrial functional MR. Moderate mitral annular calcification.   6. There is a Futures trader aortic valve. Mean gradient 8 mmHg, no  significant perivalvular leakage.   7. Aortic dilatation noted. There is mild dilatation of the aortic root,  measuring 38 mm. There is moderate dilatation of the ascending aorta,  measuring 44 mm.   8. The inferior vena cava is normal in size with greater than 50%  respiratory variability, suggesting right atrial pressure of 3 mmHg.   9. The patient was in atrial fibrillation.   EKG: Ordered today shows atrial fibrillation with controlled ventricular response, RBBB (Chronic)  Recent Labs: 06/01/2022: BNP 105.4; BUN 21; Creatinine, Ser 1.00; Potassium 4.8; Sodium 142    Recent Lipid Panel    Component Value Date/Time   CHOL 170 05/24/2021 1054   TRIG 94 05/24/2021 1054   HDL 64 05/24/2021 1054   CHOLHDL 2.7 05/24/2021 1054   LDLCALC 89 05/24/2021 1054    01/30/2023 Cholesterol 146, HDL 41, LDL 78, triglycerides 150 Hemoglobin 13, potassium 4.0, ALT 14, TSH 2.83 (most recent creatinine 1.0 from 06/01/2022)  Physical Exam:    VS:  BP 107/62 (BP Location: Left Arm, Patient Position: Sitting, Cuff Size: Large)   Pulse 78   Ht 5' 4.5" (1.638 m)   Wt 189 lb (85.7 kg)   SpO2 97%   BMI 31.94 kg/m     Wt Readings from Last 3 Encounters:  02/04/23 189 lb (85.7 kg)  06/29/22 199 lb 3.2 oz (90.4 kg)  06/06/22 198 lb 6.4 oz (90 kg)     General: Alert,  oriented x3,  no distress, mildly obese Head: no evidence of trauma, PERRL, EOMI, no exophtalmos or lid lag, no myxedema, no xanthelasma; normal ears, nose and oropharynx Neck: normal jugular venous pulsations and no hepatojugular reflux; brisk carotid pulses without delay and no carotid bruits Chest: clear to auscultation, no signs of consolidation by percussion or palpation, normal fremitus, symmetrical and full respiratory excursions Cardiovascular: normal position and quality of the apical impulse, irregular rhythm, mechanical valve sounds with normal first and widely split second heart sounds, 1-2/6 early peaking aortic ejection murmur, 1/6 apical holosystolic murmur, no diastolic murmurs, rubs or gallops Abdomen: no tenderness or distention, no masses by palpation, no abnormal pulsatility or arterial bruits, normal bowel sounds, no hepatosplenomegaly Extremities: no clubbing, cyanosis or edema; 2+ radial, ulnar and brachial pulses bilaterally; 2+ right femoral, posterior tibial and dorsalis pedis pulses; 2+ left femoral, posterior tibial and dorsalis pedis pulses; no subclavian or femoral bruits Neurological: grossly nonfocal Psych: Normal mood and affect   ASSESSMENT:    1. Permanent atrial fibrillation (HCC)   2. Paroxysmal atrial fibrillation (HCC)   3. Essential hypertension   4. Encounter for monitoring digoxin therapy          PLAN:    In order of problems listed above:  CHF: Clinically euvolemic, good functional status.   LVEF has returned to normal, suggesting that the mechanism was tachycardia related, not related to mitral valve disease.  She does not have any signs or symptoms of coronary disease or any perfusion abnormalities on the nuclear stress test from May 2021.   MR: Symptoms are stable or improved.  The murmur is barely audible, on echo the jet is central and there is forward systolic flow in the pulmonary veins.  At cardiac catheterization LV filling pressures  were very low.  Anatomically, she does have appropriate findings for MitraClip should this become necessary in the future.  Continue yearly follow-up clinically and with echo to screen for signs of LV dilation or decrease in EF. AFib: Good rate control.  On warfarin anticoagulation for mechanical heart valve.  Now a permanent arrhythmia.  She has had 2 previous ablation procedures and has failed both amiodarone and dofetilide. Often has a relatively regular rhythm suggesting a competing junctional pacemaker.   Digoxin: Check level today.  She did not do well with higher doses of carvedilol or other beta-blockers for rate control and seems to feel better on digoxin. Mech AVR: Normal function.  Normal gradients across the mechanical prosthesis (mean gradient 7 mmHg for size 19 St. Jude [employer] dual leaflet).   Anticoagulation: Therapeutic INR.  She does have a history of serious bleeding complications on 2 occasions (subdural hematoma 2006 and rectus sheath tumor 2019). OSA: She is compliant with CPAP and does not have daytime hypersomnolence Restrictive lung disease: Symptoms appear stable.  PFTs from 2018 through 2021 showed reductions in FVC of 40-56% of predicted.  This may be a significant part of her chronic dyspnea.  Fairly significant reduction in FVC, at least in part attributable to obesity.  Also has moderate thoracic dextroscoliosis.  Follow-up with Dr. Wynona Neat. Asc Ao dilation: Stable in size by CT and by recent echo (by echo 4.4 cm, by. CT scan in 2017 4.3 cm, CT  07/13/2021 shows exactly the same measurement of 4.3 cm).  Echo and CT measurements seem to match well. Aortic atherosclerosis: Mild atherosclerosis of the thoracic aorta on the most recent CT April 2023, and abdominal aortic atherosclerosis is described on CT from 2019.  HLP: She is on statin.  LDL at target less than 100. Depression: Currently seems to be well compensated.  Medication Adjustments/Labs and Tests Ordered: Current  medicines are reviewed at length with the patient today.  Concerns regarding medicines are outlined above.  Orders Placed This Encounter  Procedures   Digoxin level   EKG 12-Lead     No orders of the defined types were placed in this encounter.    Patient Instructions  Medication Instructions:  No changes *If you need a refill on your cardiac medications before your next appointment, please call your pharmacy*   Lab Work: Digoxin level If you have labs (blood work) drawn today and your tests are completely normal, you will receive your results only by: MyChart Message (if you have MyChart) OR A paper copy in the mail If you have any lab test that is abnormal or we need to change your treatment, we will call you to review the results.  Follow-Up: At [facility], you and your health needs are our priority.  As part of our continuing mission to provide you with exceptional heart care, we have created designated Provider Care Teams.  These Care Teams include your primary Cardiologist (physician) and Advanced Practice Providers (APPs -  Physician Assistants and Nurse Practitioners) who all work together to provide you with the care you need, when you need it.  We recommend signing up for the patient portal called "MyChart".  Sign up information is provided on this After Visit Summary.  MyChart is used to connect with patients for Virtual Visits (Telemedicine).  Patients are able to view lab/test results, encounter notes, upcoming appointments, etc.  Non-urgent messages can be sent to your provider as well.   To learn more about what you can do with MyChart, go to [email or web address].    Your next appointment:   1 year(s)  Provider:   Dr Royann Shivers    Signed, Thurmon Fair, MD  02/04/2023 3:15 PM    [facility]

## 2023-02-04 NOTE — Patient Instructions (Signed)
Medication Instructions:  No changes *If you need a refill on your cardiac medications before your next appointment, please call your pharmacy*   Lab Work: Digoxin level If you have labs (blood work) drawn today and your tests are completely normal, you will receive your results only by: MyChart Message (if you have MyChart) OR A paper copy in the mail If you have any lab test that is abnormal or we need to change your treatment, we will call you to review the results.  Follow-Up: At [facility], you and your health needs are our priority.  As part of our continuing mission to provide you with exceptional heart care, we have created designated Provider Care Teams.  These Care Teams include your primary Cardiologist (physician) and Advanced Practice Providers (APPs -  Physician Assistants and Nurse Practitioners) who all work together to provide you with the care you need, when you need it.  We recommend signing up for the patient portal called "MyChart".  Sign up information is provided on this After Visit Summary.  MyChart is used to connect with patients for Virtual Visits (Telemedicine).  Patients are able to view lab/test results, encounter notes, upcoming appointments, etc.  Non-urgent messages can be sent to your provider as well.   To learn more about what you can do with MyChart, go to [email or web address].    Your next appointment:   1 year(s)  Provider:   Dr Royann Shivers

## 2023-02-05 LAB — DIGOXIN LEVEL: Digoxin, Serum: 0.9 ng/mL (ref 0.5–0.9)

## 2023-02-08 DIAGNOSIS — H04123 Dry eye syndrome of bilateral lacrimal glands: Secondary | ICD-10-CM | POA: Diagnosis not present

## 2023-02-09 ENCOUNTER — Other Ambulatory Visit: Payer: Self-pay | Admitting: Cardiovascular Disease

## 2023-02-18 ENCOUNTER — Telehealth: Payer: Self-pay | Admitting: *Deleted

## 2023-02-18 ENCOUNTER — Telehealth: Payer: Medicare Other

## 2023-02-18 DIAGNOSIS — M109 Gout, unspecified: Secondary | ICD-10-CM | POA: Diagnosis not present

## 2023-02-18 MED ORDER — PREDNISONE 20 MG PO TABS: ORAL_TABLET | ORAL | 0 refills | Status: DC

## 2023-02-18 NOTE — Patient Instructions (Signed)
[name], thank you for joining Cathlyn Parsons, NP for today's virtual visit.  While this provider is not your primary care provider (PCP), if your PCP is located in our provider database this encounter information will be shared with them immediately following your visit.   A [facility] MyChart account gives you access to today's visit and all your visits, tests, and labs performed at [facility] " click here if you don't have a [facility] MyChart account or go to [email or web address]  Consent: (Patient) [name] provided verbal consent for this virtual visit at the beginning of the encounter.  Current Medications:  Current Outpatient Medications:    predniSONE (DELTASONE) 20 MG tablet, Take 2 tablets (40 mg total) by mouth daily with breakfast for 5 days, THEN 1 tablet (20 mg total) daily with breakfast for 3 days., Disp: 13 tablet, Rfl: 0   acetaminophen (TYLENOL) 500 MG tablet, Take 500 mg by mouth as needed., Disp: , Rfl:    ARIPiprazole (ABILIFY) 2 MG tablet, Take 1 tablet (2 mg total) by mouth daily., Disp: 90 tablet, Rfl: 3   buPROPion (WELLBUTRIN XL) 150 MG 24 hr tablet, Take 1 tablet (150 mg total) by mouth daily., Disp: 90 tablet, Rfl: 3   calcium-vitamin D (OSCAL WITH D) 500-200 MG-UNIT per tablet, Take 2 tablets by mouth in the morning., Disp: , Rfl:    carvedilol (COREG) 3.125 MG tablet, Take 1 tablet (3.125 mg total) by mouth 2 (two) times daily with a meal., Disp: 180 tablet, Rfl: 1   Cholecalciferol (VITAMIN D) 2000 UNITS tablet, Take 4,000 Units by mouth in the morning., Disp: , Rfl:    desvenlafaxine (PRISTIQ) 100 MG 24 hr tablet, Take 1 tablet (100 mg total) by mouth daily., Disp: 90 tablet, Rfl: 3   digoxin (LANOXIN) 0.125 MG tablet, Take 1 tablet (0.125 mg total) by mouth daily., Disp: 90 tablet, Rfl: 1   famotidine (PEPCID) 10 MG tablet, Take 10 mg by mouth as needed for heartburn or indigestion., Disp: , Rfl:    ferrous sulfate  325 (65 FE) MG tablet, Take 325 mg by mouth in the morning., Disp: , Rfl:    furosemide (LASIX) 80 MG tablet, Take 1 tablet by mouth daily. OKAY TO TAKE EXTRA FOR WEIGHT GAIN> 3LB/ DAILY OR 5LB/WEEKLY, Disp: 180 tablet, Rfl: 3   levothyroxine (SYNTHROID, LEVOTHROID) 100 MCG tablet, Take 100 mcg daily before breakfast by mouth., Disp: , Rfl:    Multiple Vitamin (MULTIVITAMIN WITH MINERALS) TABS, Take 1 tablet by mouth in the morning., Disp: , Rfl:    oxymetazoline (AFRIN) 0.05 % nasal spray, Place 1 spray into both nostrils daily as needed for congestion., Disp: , Rfl:    polyethylene glycol (MIRALAX / GLYCOLAX) 17 g packet, Take 17 g by mouth as needed., Disp: , Rfl:    potassium chloride SA (KLOR-CON M) 20 MEQ tablet, TAKE 2 TABLETS(40 MEQ) BY MOUTH DAILY, Disp: 180 tablet, Rfl: 2   rosuvastatin (CRESTOR) 20 MG tablet, TAKE 1 TABLET BY MOUTH EVERY DAY, Disp: 90 tablet, Rfl: 3   sacubitril-valsartan (ENTRESTO) 97-103 MG, TAKE 1 TABLET BY MOUTH TWICE A DAY, Disp: 180 tablet, Rfl: 3   traZODone (DESYREL) 50 MG tablet, Take 1 tablet (50 mg total) by mouth at bedtime as needed., Disp: 90 tablet, Rfl: 3   tretinoin (RETIN-A) 0.05 % cream, Apply 1 application topically at bedtime. Applied to face, Disp: , Rfl:    warfarin (COUMADIN) 4 MG tablet, TAKE 1 TABLET  BY MOUTH EVERY DAY AS DIRECTED BY COUMADIN CLINIC, Disp: 90 tablet, Rfl: 0   Medications ordered in this encounter:  Meds ordered this encounter  Medications   predniSONE (DELTASONE) 20 MG tablet    Sig: Take 2 tablets (40 mg total) by mouth daily with breakfast for 5 days, THEN 1 tablet (20 mg total) daily with breakfast for 3 days.    Dispense:  13 tablet    Refill:  0     *If you need refills on other medications prior to your next appointment, please contact your pharmacy*  Follow-Up: Call back or seek an in-person evaluation if the symptoms worsen or if the condition fails to improve as anticipated.  [facility] [phone]  Other Instructions Talk with Dr. Melba Coon about possible gout prevention options.    If you have been instructed to have an in-person evaluation today at a local Urgent Care facility, please use the link below. It will take you to a list of all of our available [facility], including address, phone number and hours of operation. Please do not delay care.  [facility]  If you or a family member do not have a primary care provider, use the link below to schedule a visit and establish care. When you choose a [facility] primary care physician or advanced practice provider, you gain a long-term partner in health. Find a Primary Care Provider  Learn more about [facility]'s in-office and virtual care options: [place] - Get Care Now

## 2023-02-18 NOTE — Telephone Encounter (Signed)
I spoke with patient who started Prednisone for Gout episode for 8 days. 11/11-11/19.  I recommended tapering Warfarin to 0.5 tablet on Monday, Wednesday and Friday this week.  She will call later in the week to schedule INR check early next week.

## 2023-02-18 NOTE — Progress Notes (Unsigned)
Virtual Visit Consent   [name], you are scheduled for a virtual visit with a [facility] provider today. Just as with appointments in the office, your consent must be obtained to participate. Your consent will be active for this visit and any virtual visit you may have with one of our providers in the next 365 days. If you have a MyChart account, a copy of this consent can be sent to you electronically.  As this is a virtual visit, video technology does not allow for your provider to perform a traditional examination. This may limit your provider's ability to fully assess your condition. If your provider identifies any concerns that need to be evaluated in person or the need to arrange testing (such as labs, EKG, etc.), we will make arrangements to do so. Although advances in technology are sophisticated, we cannot ensure that it will always work on either your end or our end. If the connection with a video visit is poor, the visit may have to be switched to a telephone visit. With either a video or telephone visit, we are not always able to ensure that we have a secure connection.  By engaging in this virtual visit, you consent to the provision of healthcare and authorize for your insurance to be billed (if applicable) for the services provided during this visit. Depending on your insurance coverage, you may receive a charge related to this service.  I need to obtain your verbal consent now. Are you willing to proceed with your visit today? [name] has provided verbal consent on 02/18/2023 for a virtual visit (video or telephone). Cathlyn Parsons, NP  Date: 02/18/2023 11:13 AM  Virtual Visit via Video Note   I, Cathlyn Parsons, connected with  [name]  ([id number], 1945/04/26) on 02/18/23 at 10:15 AM EST by a video-enabled telemedicine application and verified that I am speaking with the correct person using two identifiers.  Location: Patient: Virtual Visit  Location Patient: Home Provider: Virtual Visit Location Provider: Home Office   I discussed the limitations of evaluation and management by telemedicine and the availability of in person appointments. The patient expressed understanding and agreed to proceed.    History of Present Illness: [name] is a 77 y.o. who identifies as a female who was assigned female at birth, and is being seen today for gout flare. STarted yesterday. Has R foot pain - last gout flare was in her R toe but she feels sx are the same. Has pain, swelling, redness, and warmth to top of right foot. Since this 2nd gout flare in last 6 months, wonders is she should be taking gout prevention medicine.   HPI: HPI  Problems:  Patient Active Problem List   Diagnosis Date Noted   Secondary hypercoagulable state (HCC) 06/22/2021   Atherosclerosis of aorta (HCC) 03/05/2021   Aneurysm of descending thoracic aorta without rupture (HCC) 03/05/2021   Chronic restrictive lung disease 03/05/2021   Persistent atrial fibrillation (HCC) 03/05/2021   Sepsis (HCC) 08/14/2020   CHF exacerbation (HCC) 09/25/2019   Acute respiratory failure with hypoxia (HCC) 09/24/2019   History of subdural hematoma 09/24/2019   Hypothyroidism 09/24/2019   Acute on chronic combined systolic and diastolic CHF (congestive heart failure) (HCC) 09/24/2019   Permanent atrial fibrillation (HCC) 06/18/2019   Severe tricuspid regurgitation 12/18/2018   Cholelithiasis without cholecystitis 07/31/2017   Family history of colon cancer 07/31/2017   Slow transit constipation 07/31/2017   Hematoma 07/11/2017  Rectus sheath hematoma, initial encounter    Hyperlipidemia LDL goal <70 01/19/2017   Morbid obesity due to excess calories (HCC) complicated by low ERV on pfts/ hbp/hyperlipidemia 09/02/2016   Mitral regurgitation 06/25/2016   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   Dyspnea on exertion 04/04/2016   Sinusitis, chronic 04/04/2016   Cough  variant asthma vs UACS 04/03/2016   Primary osteoarthritis of left knee    DJD (degenerative joint disease) of knee 11/05/2014   Encounter for therapeutic drug monitoring 05/07/2013   Obstructive sleep apnea 01/20/2013   H/O mechanical aortic valve replacement 01/15/2013   Aortic valve disorder 01/15/2013   Chronic diastolic CHF (congestive heart failure) (HCC) 11/14/2012   Paroxysmal atrial fibrillation (HCC) 07/17/2012   Essential hypertension 07/17/2012    Allergies:  Allergies  Allergen Reactions   Codeine Anaphylaxis    Jerking, involuntary jerking    Dilantin [Phenytoin Sodium Extended] Rash and Itching   Metoprolol Other (See Comments)    depression    Propoxyphene Other (See Comments)    Bad vivid dreams   Medications:  Current Outpatient Medications:    predniSONE (DELTASONE) 20 MG tablet, Take 2 tablets (40 mg total) by mouth daily with breakfast for 5 days, THEN 1 tablet (20 mg total) daily with breakfast for 3 days., Disp: 13 tablet, Rfl: 0   acetaminophen (TYLENOL) 500 MG tablet, Take 500 mg by mouth as needed., Disp: , Rfl:    ARIPiprazole (ABILIFY) 2 MG tablet, Take 1 tablet (2 mg total) by mouth daily., Disp: 90 tablet, Rfl: 3   buPROPion (WELLBUTRIN XL) 150 MG 24 hr tablet, Take 1 tablet (150 mg total) by mouth daily., Disp: 90 tablet, Rfl: 3   calcium-vitamin D (OSCAL WITH D) 500-200 MG-UNIT per tablet, Take 2 tablets by mouth in the morning., Disp: , Rfl:    carvedilol (COREG) 3.125 MG tablet, Take 1 tablet (3.125 mg total) by mouth 2 (two) times daily with a meal., Disp: 180 tablet, Rfl: 1   Cholecalciferol (VITAMIN D) 2000 UNITS tablet, Take 4,000 Units by mouth in the morning., Disp: , Rfl:    desvenlafaxine (PRISTIQ) 100 MG 24 hr tablet, Take 1 tablet (100 mg total) by mouth daily., Disp: 90 tablet, Rfl: 3   digoxin (LANOXIN) 0.125 MG tablet, Take 1 tablet (0.125 mg total) by mouth daily., Disp: 90 tablet, Rfl: 1   famotidine (PEPCID) 10 MG tablet, Take 10  mg by mouth as needed for heartburn or indigestion., Disp: , Rfl:    ferrous sulfate 325 (65 FE) MG tablet, Take 325 mg by mouth in the morning., Disp: , Rfl:    furosemide (LASIX) 80 MG tablet, Take 1 tablet by mouth daily. OKAY TO TAKE EXTRA FOR WEIGHT GAIN> 3LB/ DAILY OR 5LB/WEEKLY, Disp: 180 tablet, Rfl: 3   levothyroxine (SYNTHROID, LEVOTHROID) 100 MCG tablet, Take 100 mcg daily before breakfast by mouth., Disp: , Rfl:    Multiple Vitamin (MULTIVITAMIN WITH MINERALS) TABS, Take 1 tablet by mouth in the morning., Disp: , Rfl:    oxymetazoline (AFRIN) 0.05 % nasal spray, Place 1 spray into both nostrils daily as needed for congestion., Disp: , Rfl:    polyethylene glycol (MIRALAX / GLYCOLAX) 17 g packet, Take 17 g by mouth as needed., Disp: , Rfl:    potassium chloride SA (KLOR-CON M) 20 MEQ tablet, TAKE 2 TABLETS(40 MEQ) BY MOUTH DAILY, Disp: 180 tablet, Rfl: 2   rosuvastatin (CRESTOR) 20 MG tablet, TAKE 1 TABLET BY MOUTH EVERY DAY, Disp: 90 tablet,  Rfl: 3   sacubitril-valsartan (ENTRESTO) 97-103 MG, TAKE 1 TABLET BY MOUTH TWICE A DAY, Disp: 180 tablet, Rfl: 3   traZODone (DESYREL) 50 MG tablet, Take 1 tablet (50 mg total) by mouth at bedtime as needed., Disp: 90 tablet, Rfl: 3   tretinoin (RETIN-A) 0.05 % cream, Apply 1 application topically at bedtime. Applied to face, Disp: , Rfl:    warfarin (COUMADIN) 4 MG tablet, TAKE 1 TABLET BY MOUTH EVERY DAY AS DIRECTED BY COUMADIN CLINIC, Disp: 90 tablet, Rfl: 0  Observations/Objective: Patient is well-developed, well-nourished in no acute distress.  Resting comfortably  at home.  Head is normocephalic, atraumatic.  No labored breathing.  Speech is clear and coherent with logical content.  Patient is alert and oriented at baseline.    Assessment and Plan: 1. Acute gout of right foot, unspecified cause  Rx prednisone per taper she took last June that worked for prior gout flare  Rec she f/u with PCP to discuss gout flares and possibility of  preventing further flares.   Follow Up Instructions: I discussed the assessment and treatment plan with the patient. The patient was provided an opportunity to ask questions and all were answered. The patient agreed with the plan and demonstrated an understanding of the instructions.  A copy of instructions were sent to the patient via MyChart unless otherwise noted below.   The patient was advised to call back or seek an in-person evaluation if the symptoms worsen or if the condition fails to improve as anticipated.    Cathlyn Parsons, NP

## 2023-02-18 NOTE — Telephone Encounter (Signed)
Pt left a voicemail stating she need to cancel appt tomorrow since she is having a bad case of gout. Tried calling her back and there was no answer and voicemail cut off so could not leave a message. Will try back later since need to see if any new meds and reschedule tomorrow's appt.

## 2023-02-19 ENCOUNTER — Ambulatory Visit: Payer: Medicare Other

## 2023-03-04 ENCOUNTER — Encounter: Payer: Self-pay | Admitting: Adult Health

## 2023-03-04 ENCOUNTER — Ambulatory Visit: Payer: Medicare Other | Admitting: Adult Health

## 2023-03-04 DIAGNOSIS — F331 Major depressive disorder, recurrent, moderate: Secondary | ICD-10-CM

## 2023-03-04 DIAGNOSIS — G4700 Insomnia, unspecified: Secondary | ICD-10-CM | POA: Diagnosis not present

## 2023-03-04 MED ORDER — ARIPIPRAZOLE 2 MG PO TABS: 2.0000 mg | ORAL_TABLET | Freq: Two times a day (BID) | ORAL | 3 refills | Status: DC

## 2023-03-04 NOTE — Progress Notes (Signed)
[name] [id number] November 02, 1945 77 y.o.  Subjective:   Patient ID:  [name] is a 77 y.o. (DOB 1945/11/17) female.  Chief Complaint: No chief complaint on file.   HPI [name] presents to the office today for follow-up of MDD.  Describes mood today as "ok". Pleasant. Reports decreased tearfulness. Mood symptoms - reports decreased depression and irritability. Denies anxiety. Denies panic attacks. Denies worry, rumination, and over thinking. Reports mood has improved. Stating "I feel like I'm doing better". Feels like the addition of Abilify has been helpful to manage mood symptoms - taking 3mg  currently. Varying interest and motivation. Taking medications as prescribed.  Energy levels stable. Active, walking on treadmill some days - not as much - has been more active. Enjoys some usual interests and activities. Married. Lives with husband and 2 [place] terriers. Has a son, age 60. Has one step daughter - 14. Spending time with family. Attends church.  Appetite adequate. Weight loss - 184.4 from 186 pounds. Sleeps well most nights. Averages 8 hours. Denies daytime napping.   Focus and concentration stable. Completing tasks. Managing aspects of household. Retired - [employer]. Denies SI or HI.  Denies AH or VH. Denies self harm. Denies substance use.  Previous medication trials:  Xanax, Zoloft, Wellbutrin, Valium, Ambien, Lexapro   Flowsheet Row ED from 10/27/2022 in [facility] at [facility]  ED from 08/13/2022 in [facility] at [facility]  ED from 04/13/2021 in [facility] Emergency Department at [facility]  C-SSRS RISK CATEGORY No Risk No Risk No Risk        Review of Systems:  Review of Systems  Musculoskeletal:  Negative for gait problem.  Neurological:  Negative for tremors.  Psychiatric/Behavioral:         Please refer to HPI    Medications: I have reviewed the patient's current medications.  Current Outpatient  Medications  Medication Sig Dispense Refill   acetaminophen (TYLENOL) 500 MG tablet Take 500 mg by mouth as needed.     ARIPiprazole (ABILIFY) 2 MG tablet Take 1 tablet (2 mg total) by mouth daily. 90 tablet 3   buPROPion (WELLBUTRIN XL) 150 MG 24 hr tablet Take 1 tablet (150 mg total) by mouth daily. 90 tablet 3   calcium-vitamin D (OSCAL WITH D) 500-200 MG-UNIT per tablet Take 2 tablets by mouth in the morning.     carvedilol (COREG) 3.125 MG tablet Take 1 tablet (3.125 mg total) by mouth 2 (two) times daily with a meal. 180 tablet 1   Cholecalciferol (VITAMIN D) 2000 UNITS tablet Take 4,000 Units by mouth in the morning.     desvenlafaxine (PRISTIQ) 100 MG 24 hr tablet Take 1 tablet (100 mg total) by mouth daily. 90 tablet 3   digoxin (LANOXIN) 0.125 MG tablet Take 1 tablet (0.125 mg total) by mouth daily. 90 tablet 1   famotidine (PEPCID) 10 MG tablet Take 10 mg by mouth as needed for heartburn or indigestion.     ferrous sulfate 325 (65 FE) MG tablet Take 325 mg by mouth in the morning.     furosemide (LASIX) 80 MG tablet Take 1 tablet by mouth daily. OKAY TO TAKE EXTRA FOR WEIGHT GAIN> 3LB/ DAILY OR 5LB/WEEKLY 180 tablet 3   levothyroxine (SYNTHROID, LEVOTHROID) 100 MCG tablet Take 100 mcg daily before breakfast by mouth.     Multiple Vitamin (MULTIVITAMIN WITH MINERALS) TABS Take 1 tablet by mouth in the morning.     oxymetazoline (AFRIN)  0.05 % nasal spray Place 1 spray into both nostrils daily as needed for congestion.     polyethylene glycol (MIRALAX / GLYCOLAX) 17 g packet Take 17 g by mouth as needed.     potassium chloride SA (KLOR-CON M) 20 MEQ tablet TAKE 2 TABLETS(40 MEQ) BY MOUTH DAILY 180 tablet 2   predniSONE (DELTASONE) 20 MG tablet Take 2 tablets (40 mg total) by mouth daily with breakfast for 5 days, THEN 1 tablet (20 mg total) daily with breakfast for 3 days. 13 tablet 0   rosuvastatin (CRESTOR) 20 MG tablet TAKE 1 TABLET BY MOUTH EVERY DAY 90 tablet 3    sacubitril-valsartan (ENTRESTO) 97-103 MG TAKE 1 TABLET BY MOUTH TWICE A DAY 180 tablet 3   traZODone (DESYREL) 50 MG tablet Take 1 tablet (50 mg total) by mouth at bedtime as needed. 90 tablet 3   tretinoin (RETIN-A) 0.05 % cream Apply 1 application topically at bedtime. Applied to face     warfarin (COUMADIN) 4 MG tablet TAKE 1 TABLET BY MOUTH EVERY DAY AS DIRECTED BY COUMADIN CLINIC 90 tablet 0   No current facility-administered medications for this visit.    Medication Side Effects: None  Allergies:  Allergies  Allergen Reactions   Codeine Anaphylaxis    Jerking, involuntary jerking    Dilantin [Phenytoin Sodium Extended] Rash and Itching   Metoprolol Other (See Comments)    depression    Propoxyphene Other (See Comments)    Bad vivid dreams    Past Medical History:  Diagnosis Date   Anxiety    Aortic stenosis    status post aortic valve replacement with 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   Depression    DJD (degenerative joint disease) of knee    Dyslipidemia    GERD (gastroesophageal reflux disease)    "several years ago; none since" (11/12/2012)   Hyperlipidemia    Hyperlipidemia LDL goal <70 01/19/2017   Hypertension    Left atrial enlargement    Mitral regurgitation 06/25/2016   Mild moderate MR by echo 06/2016   Obesity    Persistent atrial fibrillation (HCC)    s/p afib ablation x 2 at [facility]   Sleep apnea    On CPAP at 12cm H2O   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   Please see review of systems for further details on the patient's review from today.    Objective:   Physical Exam:  There were no vitals taken for this visit.  Physical Exam Constitutional:      General: She is not in acute distress. Musculoskeletal:        General: No deformity.  Neurological:     Mental Status: She is alert and oriented to person, place, and time.     Coordination: Coordination normal.  Psychiatric:        Attention and Perception: Attention and perception normal. She does not perceive auditory or visual hallucinations.        Mood and Affect: Affect is not labile, blunt, angry or inappropriate.  Speech: Speech normal.        Behavior: Behavior normal.        Thought Content: Thought content normal. Thought content is not paranoid or delusional. Thought content does not include homicidal or suicidal ideation. Thought content does not include homicidal or suicidal plan.        Cognition and Memory: Cognition and memory normal.        Judgment: Judgment normal.     Comments: Insight intact     Lab Review:     Component Value Date/Time   NA 142 06/01/2022 1000   K 4.8 06/01/2022 1000   CL 100 06/01/2022 1000   CO2 29 06/01/2022 1000   GLUCOSE 97 06/01/2022 1000   GLUCOSE 91 04/12/2021 1816   BUN 21 06/01/2022 1000   CREATININE 1.00 06/01/2022 1000   CREATININE 0.82 12/28/2015 1000   CALCIUM 9.8 06/01/2022 1000   PROT 7.4 08/14/2020 1730   PROT 7.1 05/27/2018 1125   ALBUMIN 3.8 08/14/2020 1730   ALBUMIN 4.5 05/27/2018 1125   AST 26 08/14/2020 1730   ALT 19 08/14/2020 1730   ALKPHOS 63 08/14/2020 1730   BILITOT 0.9 08/14/2020 1730   BILITOT 0.4 05/27/2018 1125   GFRNONAA >60 04/12/2021 1816   GFRAA 82 02/05/2020 1120       Component Value Date/Time   WBC 4.9 04/12/2021 1816   RBC 4.00 04/12/2021 1816   HGB 12.6 04/12/2021 1816   HGB 11.7 09/28/2020 1017   HCT 37.3 04/12/2021 1816   HCT 34.6 09/28/2020 1017   PLT 192 04/12/2021 1816   PLT 207 09/28/2020 1017   MCV 93.3 04/12/2021 1816   MCV 92 09/28/2020 1017   MCH  31.5 04/12/2021 1816   MCHC 33.8 04/12/2021 1816   RDW 12.8 04/12/2021 1816   RDW 12.6 09/28/2020 1017   LYMPHSABS 0.9 09/28/2020 1017   MONOABS 0.6 08/18/2020 0338   EOSABS 0.2 09/28/2020 1017   BASOSABS 0.0 09/28/2020 1017    No results found for: "POCLITH", "LITHIUM"   No results found for: "PHENYTOIN", "PHENOBARB", "VALPROATE", "CBMZ"   .res Assessment: Plan:    Plan:  PDMP reviewed  Trazadone 50mg  for sleep Pristiq 100mg  daily  Wellbutrin XL 150mg  every morning. Denies seizure history.  Increase Abilify 2mg  daily - taking one and 1/2 tablets daily - may increase to 2mg  twice daily.  RTC 8 weeks  Patient advised to contact office with any questions, adverse effects, or acute worsening in signs and symptoms.  Discussed potential metabolic side effects associated with atypical antipsychotics, as well as potential risk for movement side effects. Advised pt to contact office if movement side effects occur.    There are no diagnoses linked to this encounter.   Please see After Visit Summary for patient specific instructions.  Future Appointments  Date Time Provider Department Center  03/04/2023  9:40 AM Tirza Senteno, Thereasa Solo, NP [facility] None  06/04/2023  8:40 AM Croitoru, Rachelle Hora, MD [facility] None    No orders of the defined types were placed in this encounter.   -------------------------------

## 2023-03-19 DIAGNOSIS — R051 Acute cough: Secondary | ICD-10-CM | POA: Diagnosis not present

## 2023-03-19 DIAGNOSIS — Z03818 Encounter for observation for suspected exposure to other biological agents ruled out: Secondary | ICD-10-CM | POA: Diagnosis not present

## 2023-03-19 DIAGNOSIS — J040 Acute laryngitis: Secondary | ICD-10-CM | POA: Diagnosis not present

## 2023-03-23 ENCOUNTER — Inpatient Hospital Stay (HOSPITAL_COMMUNITY)
Admission: EM | Admit: 2023-03-23 | Discharge: 2023-03-27 | DRG: 193 | Disposition: A | Discharge status: Home / Self Care | Payer: Medicare Other | Source: Ambulatory Visit | Attending: Internal Medicine | Admitting: Internal Medicine

## 2023-03-23 ENCOUNTER — Emergency Department (HOSPITAL_COMMUNITY): Payer: Medicare Other

## 2023-03-23 ENCOUNTER — Encounter (HOSPITAL_COMMUNITY): Payer: Self-pay

## 2023-03-23 ENCOUNTER — Other Ambulatory Visit: Payer: Self-pay

## 2023-03-23 DIAGNOSIS — I77810 Thoracic aortic ectasia: Secondary | ICD-10-CM | POA: Diagnosis present

## 2023-03-23 DIAGNOSIS — F32A Depression, unspecified: Secondary | ICD-10-CM | POA: Diagnosis present

## 2023-03-23 DIAGNOSIS — I5032 Chronic diastolic (congestive) heart failure: Secondary | ICD-10-CM | POA: Diagnosis present

## 2023-03-23 DIAGNOSIS — E782 Mixed hyperlipidemia: Secondary | ICD-10-CM

## 2023-03-23 DIAGNOSIS — Z952 Presence of prosthetic heart valve: Secondary | ICD-10-CM | POA: Diagnosis not present

## 2023-03-23 DIAGNOSIS — Z5181 Encounter for therapeutic drug level monitoring: Secondary | ICD-10-CM | POA: Diagnosis not present

## 2023-03-23 DIAGNOSIS — R0902 Hypoxemia: Secondary | ICD-10-CM | POA: Diagnosis not present

## 2023-03-23 DIAGNOSIS — J22 Unspecified acute lower respiratory infection: Secondary | ICD-10-CM | POA: Diagnosis not present

## 2023-03-23 DIAGNOSIS — J9601 Acute respiratory failure with hypoxia: Principal | ICD-10-CM | POA: Diagnosis present

## 2023-03-23 DIAGNOSIS — Z20822 Contact with and (suspected) exposure to covid-19: Secondary | ICD-10-CM | POA: Diagnosis not present

## 2023-03-23 DIAGNOSIS — E66811 Obesity, class 1: Secondary | ICD-10-CM | POA: Diagnosis present

## 2023-03-23 DIAGNOSIS — Z888 Allergy status to other drugs, medicaments and biological substances status: Secondary | ICD-10-CM

## 2023-03-23 DIAGNOSIS — Z6833 Body mass index (BMI) 33.0-33.9, adult: Secondary | ICD-10-CM

## 2023-03-23 DIAGNOSIS — I110 Hypertensive heart disease with heart failure: Secondary | ICD-10-CM | POA: Diagnosis not present

## 2023-03-23 DIAGNOSIS — R059 Cough, unspecified: Secondary | ICD-10-CM | POA: Diagnosis not present

## 2023-03-23 DIAGNOSIS — I340 Nonrheumatic mitral (valve) insufficiency: Secondary | ICD-10-CM | POA: Diagnosis not present

## 2023-03-23 DIAGNOSIS — I480 Paroxysmal atrial fibrillation: Secondary | ICD-10-CM

## 2023-03-23 DIAGNOSIS — I4819 Other persistent atrial fibrillation: Secondary | ICD-10-CM | POA: Diagnosis present

## 2023-03-23 DIAGNOSIS — R0602 Shortness of breath: Secondary | ICD-10-CM | POA: Diagnosis not present

## 2023-03-23 DIAGNOSIS — R791 Abnormal coagulation profile: Secondary | ICD-10-CM | POA: Diagnosis present

## 2023-03-23 DIAGNOSIS — G4733 Obstructive sleep apnea (adult) (pediatric): Secondary | ICD-10-CM | POA: Diagnosis not present

## 2023-03-23 DIAGNOSIS — F419 Anxiety disorder, unspecified: Secondary | ICD-10-CM | POA: Diagnosis not present

## 2023-03-23 DIAGNOSIS — R051 Acute cough: Secondary | ICD-10-CM | POA: Diagnosis not present

## 2023-03-23 DIAGNOSIS — J189 Pneumonia, unspecified organism: Secondary | ICD-10-CM | POA: Diagnosis not present

## 2023-03-23 DIAGNOSIS — E860 Dehydration: Secondary | ICD-10-CM | POA: Diagnosis present

## 2023-03-23 DIAGNOSIS — E876 Hypokalemia: Secondary | ICD-10-CM | POA: Diagnosis not present

## 2023-03-23 DIAGNOSIS — E039 Hypothyroidism, unspecified: Secondary | ICD-10-CM | POA: Diagnosis not present

## 2023-03-23 DIAGNOSIS — E785 Hyperlipidemia, unspecified: Secondary | ICD-10-CM | POA: Diagnosis not present

## 2023-03-23 DIAGNOSIS — J939 Pneumothorax, unspecified: Secondary | ICD-10-CM | POA: Diagnosis not present

## 2023-03-23 DIAGNOSIS — Z96652 Presence of left artificial knee joint: Secondary | ICD-10-CM | POA: Diagnosis not present

## 2023-03-23 DIAGNOSIS — Z7901 Long term (current) use of anticoagulants: Secondary | ICD-10-CM

## 2023-03-23 DIAGNOSIS — Z8249 Family history of ischemic heart disease and other diseases of the circulatory system: Secondary | ICD-10-CM

## 2023-03-23 DIAGNOSIS — I4510 Unspecified right bundle-branch block: Secondary | ICD-10-CM | POA: Diagnosis present

## 2023-03-23 DIAGNOSIS — I493 Ventricular premature depolarization: Secondary | ICD-10-CM | POA: Diagnosis not present

## 2023-03-23 DIAGNOSIS — Z83438 Family history of other disorder of lipoprotein metabolism and other lipidemia: Secondary | ICD-10-CM

## 2023-03-23 DIAGNOSIS — Z7722 Contact with and (suspected) exposure to environmental tobacco smoke (acute) (chronic): Secondary | ICD-10-CM | POA: Diagnosis present

## 2023-03-23 DIAGNOSIS — I10 Essential (primary) hypertension: Secondary | ICD-10-CM | POA: Diagnosis present

## 2023-03-23 DIAGNOSIS — R918 Other nonspecific abnormal finding of lung field: Secondary | ICD-10-CM | POA: Diagnosis not present

## 2023-03-23 DIAGNOSIS — Z79890 Hormone replacement therapy (postmenopausal): Secondary | ICD-10-CM | POA: Diagnosis not present

## 2023-03-23 DIAGNOSIS — J440 Chronic obstructive pulmonary disease with acute lower respiratory infection: Secondary | ICD-10-CM | POA: Diagnosis not present

## 2023-03-23 DIAGNOSIS — J168 Pneumonia due to other specified infectious organisms: Secondary | ICD-10-CM | POA: Diagnosis not present

## 2023-03-23 DIAGNOSIS — I4891 Unspecified atrial fibrillation: Secondary | ICD-10-CM | POA: Diagnosis not present

## 2023-03-23 DIAGNOSIS — Z801 Family history of malignant neoplasm of trachea, bronchus and lung: Secondary | ICD-10-CM

## 2023-03-23 DIAGNOSIS — Z79899 Other long term (current) drug therapy: Secondary | ICD-10-CM

## 2023-03-23 DIAGNOSIS — Z885 Allergy status to narcotic agent status: Secondary | ICD-10-CM

## 2023-03-23 LAB — URINALYSIS, W/ REFLEX TO CULTURE (INFECTION SUSPECTED)
Bilirubin Urine: NEGATIVE
Glucose, UA: NEGATIVE mg/dL
Hgb urine dipstick: NEGATIVE
Ketones, ur: NEGATIVE mg/dL
Nitrite: NEGATIVE
Protein, ur: 30 mg/dL — AB
Specific Gravity, Urine: 1.013 (ref 1.005–1.030)
pH: 5 (ref 5.0–8.0)

## 2023-03-23 LAB — PROTIME-INR
INR: 1.8 — ABNORMAL HIGH (ref 0.8–1.2)
Prothrombin Time: 21.2 s — ABNORMAL HIGH (ref 11.4–15.2)

## 2023-03-23 LAB — CBC WITH DIFFERENTIAL/PLATELET
Abs Immature Granulocytes: 0.02 10*3/uL (ref 0.00–0.07)
Basophils Absolute: 0 10*3/uL (ref 0.0–0.1)
Basophils Relative: 0 %
Eosinophils Absolute: 0.3 10*3/uL (ref 0.0–0.5)
Eosinophils Relative: 3 %
HCT: 36.3 % (ref 36.0–46.0)
Hemoglobin: 12.4 g/dL (ref 12.0–15.0)
Immature Granulocytes: 0 %
Lymphocytes Relative: 11 %
Lymphs Abs: 1.1 10*3/uL (ref 0.7–4.0)
MCH: 30.2 pg (ref 26.0–34.0)
MCHC: 34.2 g/dL (ref 30.0–36.0)
MCV: 88.5 fL (ref 80.0–100.0)
Monocytes Absolute: 1.3 10*3/uL — ABNORMAL HIGH (ref 0.1–1.0)
Monocytes Relative: 13 %
Neutro Abs: 7.1 10*3/uL (ref 1.7–7.7)
Neutrophils Relative %: 73 %
Platelets: 225 10*3/uL (ref 150–400)
RBC: 4.1 MIL/uL (ref 3.87–5.11)
RDW: 12.9 % (ref 11.5–15.5)
WBC: 9.9 10*3/uL (ref 4.0–10.5)
nRBC: 0 % (ref 0.0–0.2)

## 2023-03-23 LAB — COMPREHENSIVE METABOLIC PANEL
ALT: 28 U/L (ref 0–44)
AST: 38 U/L (ref 15–41)
Albumin: 3.4 g/dL — ABNORMAL LOW (ref 3.5–5.0)
Alkaline Phosphatase: 69 U/L (ref 38–126)
Anion gap: 14 (ref 5–15)
BUN: 28 mg/dL — ABNORMAL HIGH (ref 8–23)
CO2: 26 mmol/L (ref 22–32)
Calcium: 8.8 mg/dL — ABNORMAL LOW (ref 8.9–10.3)
Chloride: 96 mmol/L — ABNORMAL LOW (ref 98–111)
Creatinine, Ser: 1.15 mg/dL — ABNORMAL HIGH (ref 0.44–1.00)
GFR, Estimated: 49 mL/min — ABNORMAL LOW (ref >60)
Glucose, Bld: 115 mg/dL — ABNORMAL HIGH (ref 70–99)
Potassium: 3.3 mmol/L — ABNORMAL LOW (ref 3.5–5.1)
Sodium: 136 mmol/L (ref 135–145)
Total Bilirubin: 0.9 mg/dL (ref <1.2)
Total Protein: 6.9 g/dL (ref 6.5–8.1)

## 2023-03-23 LAB — PROCALCITONIN: Procalcitonin: 0.12 ng/mL

## 2023-03-23 LAB — RESP PANEL BY RT-PCR (RSV, FLU A&B, COVID)  RVPGX2
Influenza A by PCR: NEGATIVE
Influenza B by PCR: NEGATIVE
Resp Syncytial Virus by PCR: NEGATIVE
SARS Coronavirus 2 by RT PCR: NEGATIVE

## 2023-03-23 LAB — BRAIN NATRIURETIC PEPTIDE: B Natriuretic Peptide: 205.5 pg/mL — ABNORMAL HIGH (ref 0.0–100.0)

## 2023-03-23 LAB — STREP PNEUMONIAE URINARY ANTIGEN: Strep Pneumo Urinary Antigen: NEGATIVE

## 2023-03-23 LAB — I-STAT CG4 LACTIC ACID, ED: Lactic Acid, Venous: 1.2 mmol/L (ref 0.5–1.9)

## 2023-03-23 LAB — MAGNESIUM: Magnesium: 2 mg/dL (ref 1.7–2.4)

## 2023-03-23 MED ORDER — WARFARIN - PHARMACIST DOSING INPATIENT: Freq: Every day | Status: DC

## 2023-03-23 MED ORDER — DIGOXIN 125 MCG PO TABS
0.1250 mg | ORAL_TABLET | Freq: Every day | ORAL | Status: DC
  Administered 2023-03-24 – 2023-03-27 (×4): 0.125 mg via ORAL
  Filled 2023-03-23 (×4): qty 1

## 2023-03-23 MED ORDER — SODIUM CHLORIDE 0.9 % IV SOLN
1.0000 g | Freq: Once | INTRAVENOUS | Status: AC
  Administered 2023-03-23: 1 g via INTRAVENOUS
  Filled 2023-03-23: qty 10

## 2023-03-23 MED ORDER — LEVOTHYROXINE SODIUM 100 MCG PO TABS
100.0000 ug | ORAL_TABLET | Freq: Every day | ORAL | Status: DC
  Administered 2023-03-24 – 2023-03-27 (×4): 100 ug via ORAL
  Filled 2023-03-23 (×4): qty 1

## 2023-03-23 MED ORDER — FUROSEMIDE 40 MG PO TABS
80.0000 mg | ORAL_TABLET | Freq: Every day | ORAL | Status: DC
Start: 2023-03-24 — End: 2023-03-27
  Administered 2023-03-24 – 2023-03-27 (×4): 80 mg via ORAL
  Filled 2023-03-23 (×4): qty 2

## 2023-03-23 MED ORDER — ACETAMINOPHEN 650 MG RE SUPP: 650.0000 mg | Freq: Four times a day (QID) | RECTAL | Status: DC | PRN

## 2023-03-23 MED ORDER — SODIUM CHLORIDE 0.9 % IV SOLN
500.0000 mg | INTRAVENOUS | Status: DC
  Administered 2023-03-24 – 2023-03-25 (×2): 500 mg via INTRAVENOUS
  Filled 2023-03-23 (×2): qty 5

## 2023-03-23 MED ORDER — SODIUM CHLORIDE 0.9 % IV BOLUS
500.0000 mL | Freq: Once | INTRAVENOUS | Status: AC
  Administered 2023-03-23: 500 mL via INTRAVENOUS

## 2023-03-23 MED ORDER — ACETAMINOPHEN 325 MG PO TABS: 650.0000 mg | ORAL_TABLET | Freq: Four times a day (QID) | ORAL | Status: DC | PRN

## 2023-03-23 MED ORDER — GUAIFENESIN ER 600 MG PO TB12
600.0000 mg | ORAL_TABLET | Freq: Two times a day (BID) | ORAL | Status: DC
  Administered 2023-03-23 – 2023-03-27 (×8): 600 mg via ORAL
  Filled 2023-03-23 (×8): qty 1

## 2023-03-23 MED ORDER — POTASSIUM CHLORIDE CRYS ER 20 MEQ PO TBCR
40.0000 meq | EXTENDED_RELEASE_TABLET | Freq: Every day | ORAL | Status: DC
  Administered 2023-03-24 – 2023-03-27 (×4): 40 meq via ORAL
  Filled 2023-03-23 (×4): qty 2

## 2023-03-23 MED ORDER — POTASSIUM CHLORIDE CRYS ER 20 MEQ PO TBCR
40.0000 meq | EXTENDED_RELEASE_TABLET | Freq: Once | ORAL | Status: AC
  Administered 2023-03-23: 40 meq via ORAL
  Filled 2023-03-23: qty 2

## 2023-03-23 MED ORDER — POTASSIUM CHLORIDE CRYS ER 20 MEQ PO TBCR: 40.0000 meq | EXTENDED_RELEASE_TABLET | Freq: Once | ORAL | Status: DC

## 2023-03-23 MED ORDER — SACUBITRIL-VALSARTAN 97-103 MG PO TABS
1.0000 | ORAL_TABLET | Freq: Two times a day (BID) | ORAL | Status: DC
Start: 2023-03-23 — End: 2023-03-27
  Administered 2023-03-23 – 2023-03-27 (×8): 1 via ORAL
  Filled 2023-03-23 (×9): qty 1

## 2023-03-23 MED ORDER — ARIPIPRAZOLE 2 MG PO TABS
2.0000 mg | ORAL_TABLET | Freq: Two times a day (BID) | ORAL | Status: DC
  Administered 2023-03-23 – 2023-03-27 (×8): 2 mg via ORAL
  Filled 2023-03-23 (×10): qty 1

## 2023-03-23 MED ORDER — BUPROPION HCL ER (XL) 150 MG PO TB24
150.0000 mg | ORAL_TABLET | Freq: Every day | ORAL | Status: DC
  Administered 2023-03-24 – 2023-03-27 (×4): 150 mg via ORAL
  Filled 2023-03-23 (×4): qty 1

## 2023-03-23 MED ORDER — BENZONATATE 100 MG PO CAPS
200.0000 mg | ORAL_CAPSULE | Freq: Once | ORAL | Status: AC
  Administered 2023-03-23: 200 mg via ORAL
  Filled 2023-03-23: qty 2

## 2023-03-23 MED ORDER — BENZONATATE 100 MG PO CAPS
200.0000 mg | ORAL_CAPSULE | Freq: Three times a day (TID) | ORAL | Status: DC | PRN
  Administered 2023-03-23 – 2023-03-27 (×5): 200 mg via ORAL
  Filled 2023-03-23 (×5): qty 2

## 2023-03-23 MED ORDER — WARFARIN SODIUM 5 MG PO TABS
5.0000 mg | ORAL_TABLET | Freq: Once | ORAL | Status: AC
  Administered 2023-03-23: 5 mg via ORAL
  Filled 2023-03-23: qty 1

## 2023-03-23 MED ORDER — ALPRAZOLAM 0.5 MG PO TABS
0.5000 mg | ORAL_TABLET | Freq: Two times a day (BID) | ORAL | Status: DC | PRN
  Administered 2023-03-23 – 2023-03-25 (×3): 0.5 mg via ORAL
  Filled 2023-03-23 (×3): qty 1

## 2023-03-23 MED ORDER — ONDANSETRON HCL 4 MG/2ML IJ SOLN: 4.0000 mg | Freq: Four times a day (QID) | INTRAMUSCULAR | Status: DC | PRN

## 2023-03-23 MED ORDER — SODIUM CHLORIDE 0.9 % IV SOLN
1.0000 g | INTRAVENOUS | Status: DC
Start: 2023-03-24 — End: 2023-03-25
  Administered 2023-03-24: 1 g via INTRAVENOUS
  Filled 2023-03-23: qty 10

## 2023-03-23 MED ORDER — ROSUVASTATIN CALCIUM 20 MG PO TABS
20.0000 mg | ORAL_TABLET | Freq: Every day | ORAL | Status: DC
  Administered 2023-03-23 – 2023-03-26 (×4): 20 mg via ORAL
  Filled 2023-03-23 (×4): qty 1

## 2023-03-23 MED ORDER — SODIUM CHLORIDE 0.9 % IV SOLN
500.0000 mg | Freq: Once | INTRAVENOUS | Status: AC
  Administered 2023-03-23: 500 mg via INTRAVENOUS
  Filled 2023-03-23: qty 5

## 2023-03-23 MED ORDER — CARVEDILOL 3.125 MG PO TABS
3.1250 mg | ORAL_TABLET | Freq: Two times a day (BID) | ORAL | Status: DC
  Administered 2023-03-24 – 2023-03-27 (×7): 3.125 mg via ORAL
  Filled 2023-03-23 (×7): qty 1

## 2023-03-23 MED ORDER — MELATONIN 3 MG PO TABS
3.0000 mg | ORAL_TABLET | Freq: Every evening | ORAL | Status: DC | PRN
  Administered 2023-03-23 – 2023-03-26 (×3): 3 mg via ORAL
  Filled 2023-03-23 (×3): qty 1

## 2023-03-23 NOTE — H&P (Signed)
History and Physical      [name] ZOX:[id number] DOB: 23-Aug-1945 DOA: 03/23/2023; DOS: 03/23/2023  PCP: Sabino Dick, DO  Patient coming from: home   I have personally briefly reviewed patient's old medical records in [facility]  Chief Complaint: sob  HPI: [name] is a 77 y.o. female with medical history significant for chronic diastolic heart failure, persistent atrial fibrillation chronically anticoagulated on warfarin, mechanical aortic valve replacement, obstructive sleep apnea on nocturnal CPAP, essential pretension, who is admitted to [facility] on 03/23/2023 with community-acquired pneumonia after presenting from home to [facility] ED complaining of shortness of breath.   The patient reports 7 to 10 days of shortness of breath associate with new onset productive cough.  This is also been associated with subjective fever, in the absence of chills, full body rigors, or generalized myalgias.  Her shortness of breath is not been associate with any orthopnea, PND, worsening peripheral edema.  No hemoptysis, chest pain, palpitations, diaphoresis, nausea, vomiting, dizziness, presyncope, or syncope.  Associate any abdominal pain, dysuria or gross hematuria.  1 to 2 days into the above symptoms, she had presented to the local urgent care, who felt that she likely had a lower respiratory tract infection, suspected be on the basis of viral etiology.  Urgent care reportedly performed a viral panel at that time which was found to be negative.  She was discharged from urgent care with supportive measures as well as a prescription for Augmentin that she was instructed to begin to take if her symptoms did not subsequently improve.  In the context of further progression in the above symptoms, she subsequently started the aforementioned Augmentin, and, after 3 days of Augmentin, has experienced no improvement in the above symptoms, prompting her to present to [facility] emergency department this evening for further evaluation management of the above.  She confirms no known history of baseline supplemental oxygen requirements.     ED Course:  Vital signs in the ED were notable for the following: Afebrile; heart rates in the 90s to low 100s; systolic blood pressures in the low 100s to 130s; respiratory rate 19-30, initial oxygen saturation 88% on room air, Sosan improving into the range of 99 to 100% on 3 L nasal cannula.  Labs were notable for the following: CMP was notable for the following: Sodium 136, potassium 3.3, bicarbonate 26, creatinine 1.15 compared to most recent prior value of 1.0 on 06/01/2022, BUN to current ratio 24.3, calcium adjusted for mild hyperlipidemia noted to be 9.2, avidin 3.4, otherwise liver enzymes were within normal limits.  Magnesium level 2.0.  BNP 205 compared to most recent prior value of 598 in May 2022.  Lactic acid 1.2.  CBC notable for white blood cell count 9900 with 73% neutrophils, hemoglobin 12.4.  INR 1.8.  Analysis showed no evidence of white blood cells close nitrate negative, with trace leukocyte Estrace and was also noted to be positive for hyaline casts as well as muddy brown granular casts.  Blood cultures x 2 were collected prior to initiation of IV antibiotics.  COVID, lives in, RSV PCR were all negative.  Per my interpretation, EKG in ED demonstrated the following: Atrial fibrillation with multiple PVCs, heart rate 105, right bundle branch block, no evidence of T wave or ST changes, including no evidence of ST ovation.  Imaging in the ED, per corresponding formal radiology read, was notable for the following: 2 view chest x-ray showed evidence of left lower lobe  airspace opacity concerning for pneumonia, as well as possible small left pleural effusion in the absence of any evidence of edema or pneumothorax.  While in the ED, the following were administered: Tessalon Perles 2 mg p.o. x 1 dose, azithromycin and  Rocephin, normal saline dose of 100 cc bolus.  Subsequently, the patient was admitted for further evaluation management of presenting community-acquired pneumonia complicated by acute hypoxic respiratory distress, with presenting labs notable for mild hypokalemia.    Review of Systems: As per HPI otherwise 10 point review of systems negative.   Past Medical History:  Diagnosis Date   Anxiety    Aortic stenosis    status post aortic valve replacement with St. 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   Depression    DJD (degenerative joint disease) of knee    Dyslipidemia    GERD (gastroesophageal reflux disease)    "several years ago; none since" (11/12/2012)   Hyperlipidemia    Hyperlipidemia LDL goal <70 01/19/2017   Hypertension    Left atrial enlargement    Mitral regurgitation 06/25/2016   Mild moderate MR by echo 06/2016   Obesity    Persistent atrial fibrillation (HCC)    s/p afib ablation x 2 at [facility]   Sleep apnea    On CPAP at 12cm H2O   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 Jude AVR   CARDIOVERSION N/A 07/22/2012   Procedure: CARDIOVERSION;  Surgeon: Donato Schultz, MD;  Location: [facility] ENDOSCOPY;  Service: Cardiovascular;  Laterality: N/A;   CARDIOVERSION N/A 11/25/2012   Procedure: CARDIOVERSION;  Surgeon: Quintella Reichert, MD;  Location: [facility] ENDOSCOPY;  Service: Cardiovascular;  Laterality: N/A;   CARDIOVERSION N/A 12/30/2012   Procedure: CARDIOVERSION;  Surgeon: Quintella Reichert, MD;  Location: [facility] ENDOSCOPY;  Service: Cardiovascular;  Laterality: N/A;  h&p in file-HW     CARDIOVERSION N/A 03/30/2013   Procedure: CARDIOVERSION;  Surgeon: Quintella Reichert, MD;  Location: [facility] ENDOSCOPY;  Service: Cardiovascular;  Laterality: N/A;   ELECTROPHYSIOLOGIC STUDY  11/2013   Afib ablation x 2 (11/2013 and 10/2015) at [facility] by Dr Smith Robert with recurrence post ablation   KNEE ARTHROSCOPY Left 1980's   "2" (11/12/2012)   KNEE SURGERY Left 1980's   "after 2 scopes they went in and did some kind of OR" (11/12/2012)   RIGHT/LEFT HEART CATH AND CORONARY ANGIOGRAPHY N/A 10/14/2020   Procedure: RIGHT/LEFT HEART CATH AND CORONARY ANGIOGRAPHY;  Surgeon: Swazil[place], Peter M, MD;  Location: [facility] INVASIVE CV LAB;  Service: Cardiovascular;  Laterality: N/A;   TEE WITHOUT CARDIOVERSION N/A 07/22/2012   Procedure: TRANSESOPHAGEAL ECHOCARDIOGRAM (TEE);  Surgeon: Donato Schultz, MD;  Location: [facility] ENDOSCOPY;  Service: Cardiovascular;  Laterality: N/A;  Rm 2034   TEE WITHOUT CARDIOVERSION N/A 10/07/2013   Procedure: TRANSESOPHAGEAL ECHOCARDIOGRAM (TEE);  Surgeon: Donato Schultz, MD;  Location: [facility] ENDOSCOPY;  Service: Cardiovascular;  Laterality: N/A;   TEE WITHOUT CARDIOVERSION N/A 07/12/2020   Procedure: TRANSESOPHAGEAL ECHOCARDIOGRAM (TEE);  Surgeon: Thurmon Fair, MD;  Location: [facility] ENDOSCOPY;  Service: Cardiovascular;  Laterality:  N/A;   TOTAL KNEE ARTHROPLASTY Left 11/05/2014   Procedure: TOTAL KNEE ARTHROPLASTY;  Surgeon: Jodi Geralds, MD;  Location: [facility] OR;  Service: Orthopedics;  Laterality: Left;   TUBAL LIGATION  1980's    Social History:  reports that she has never smoked. She has never used smokeless tobacco. She reports that she does not drink alcohol and does not use drugs.   Allergies  Allergen Reactions   Codeine Anaphylaxis    Jerking, involuntary jerking    Dilantin [Phenytoin Sodium Extended] Rash and Itching   Metoprolol Other (See Comments)    depression    Propoxyphene Other (See Comments)    Bad vivid dreams    Family History  Problem Relation Age of Onset   Lung cancer Mother     Hypertension Mother    Hyperlipidemia Father    Hypertension Father    Heart disease Brother     Family history reviewed and not pertinent    Prior to Admission medications   Medication Sig Start Date End Date Taking? Authorizing Provider  acetaminophen (TYLENOL) 500 MG tablet Take 500 mg by mouth as needed.   Yes [provider]  amoxicillin (AMOXIL) 875 MG tablet Take 875 mg by mouth 2 (two) times daily. 10 days 03/19/23  Yes [provider]  ARIPiprazole (ABILIFY) 2 MG tablet Take 1 tablet (2 mg total) by mouth 2 (two) times daily. 03/04/23  Yes Mozingo, Thereasa Solo, NP  buPROPion (WELLBUTRIN XL) 150 MG 24 hr tablet Take 1 tablet (150 mg total) by mouth daily. 01/01/23  Yes Mozingo, Thereasa Solo, NP  calcium-vitamin D (OSCAL WITH D) 500-200 MG-UNIT per tablet Take 2 tablets by mouth in the morning.   Yes [provider]  carvedilol (COREG) 3.125 MG tablet Take 1 tablet (3.125 mg total) by mouth 2 (two) times daily with a meal. 12/07/22  Yes Croitoru, Mihai, MD  Cholecalciferol (VITAMIN D) 2000 UNITS tablet Take 4,000 Units by mouth in the morning.   Yes [provider]  desvenlafaxine (PRISTIQ) 100 MG 24 hr tablet Take 1 tablet (100 mg total) by mouth daily. 07/27/22  Yes Mozingo, Thereasa Solo, NP  digoxin (LANOXIN) 0.125 MG tablet Take 1 tablet (0.125 mg total) by mouth daily. 12/07/22  Yes Croitoru, Mihai, MD  famotidine (PEPCID) 10 MG tablet Take 10 mg by mouth as needed for heartburn or indigestion.   Yes [provider]  ferrous sulfate 325 (65 FE) MG tablet Take 325 mg by mouth in the morning.   Yes [provider]  furosemide (LASIX) 80 MG tablet Take 1 tablet by mouth daily. OKAY TO TAKE EXTRA FOR WEIGHT GAIN> 3LB/ DAILY OR 5LB/WEEKLY 05/30/22  Yes Croitoru, Mihai, MD  levothyroxine (SYNTHROID, LEVOTHROID) 100 MCG tablet Take 100 mcg daily before breakfast by mouth.   Yes [provider]  Multiple Vitamin  (MULTIVITAMIN WITH MINERALS) TABS Take 1 tablet by mouth in the morning.   Yes [provider]  oxymetazoline (AFRIN) 0.05 % nasal spray Place 1 spray into both nostrils daily as needed for congestion.   Yes [provider]  potassium chloride SA (KLOR-CON M) 20 MEQ tablet TAKE 2 TABLETS(40 MEQ) BY MOUTH DAILY 09/11/22  Yes Croitoru, Mihai, MD  rosuvastatin (CRESTOR) 20 MG tablet TAKE 1 TABLET BY MOUTH EVERY DAY 01/29/23  Yes Tobb, Kardie, DO  sacubitril-valsartan (ENTRESTO) 97-103 MG TAKE 1 TABLET BY MOUTH TWICE A DAY 02/11/23  Yes Croitoru, Mihai, MD  traZODone (DESYREL) 50 MG tablet Take 1 tablet (  50 mg total) by mouth at bedtime as needed. 07/27/22  Yes Mozingo, Thereasa Solo, NP  tretinoin (RETIN-A) 0.05 % cream Apply 1 application topically at bedtime. Applied to face   Yes [provider]  warfarin (COUMADIN) 4 MG tablet TAKE 1 TABLET BY MOUTH EVERY DAY AS DIRECTED BY COUMADIN CLINIC Patient taking differently: Take 4 mg by mouth daily at 4 PM. TAKE 1 TABLET BY MOUTH EVERY DAY AS DIRECTED BY COUMADIN CLINIC 12/14/22  Yes Croitoru, Mihai, MD     Objective    Physical Exam: Vitals:   03/23/23 1712 03/23/23 1718 03/23/23 1815 03/23/23 1906  BP:   100/64 (!) 131/105  Pulse: (!) 105  (!) 46 95  Resp:   20 19  Temp:      SpO2: 93%  100% 99%  Weight:  85.7 kg    Height:  5' 4.5" (1.638 m)      General: appears to be stated age; alert, oriented Skin: warm, dry, no rash Head:  AT/[place] Mouth:  Oral mucosa membranes appear dry, normal dentition Neck: supple; trachea midline Heart: Irregular, mildly tachycardic; did not appreciate any M/R/G Lungs: CTAB, did not appreciate any wheezes, rales, or rhonchi Abdomen: + BS; soft, ND, NT Vascular: 2+ pedal pulses b/l; 2+ radial pulses b/l Extremities: no peripheral edema, no muscle wasting Neuro: strength and sensation intact in upper and lower extremities b/l    Labs on Admission: I have personally reviewed following  labs and imaging studies  CBC: Recent Labs  Lab 03/23/23 1728  WBC 9.9  NEUTROABS 7.1  HGB 12.4  HCT 36.3  MCV 88.5  PLT 225   Basic Metabolic Panel: Recent Labs  Lab 03/23/23 1728  NA 136  K 3.3*  CL 96*  CO2 26  GLUCOSE 115*  BUN 28*  CREATININE 1.15*  CALCIUM 8.8*   GFR: Estimated Creatinine Clearance: 43.8 mL/min (A) (by C-G formula based on SCr of 1.15 mg/dL (H)). Liver Function Tests: Recent Labs  Lab 03/23/23 1728  AST 38  ALT 28  ALKPHOS 69  BILITOT 0.9  PROT 6.9  ALBUMIN 3.4*   No results for input(s): "LIPASE", "AMYLASE" in the last 168 hours. No results for input(s): "AMMONIA" in the last 168 hours. Coagulation Profile: Recent Labs  Lab 03/23/23 1728  INR 1.8*   Cardiac Enzymes: No results for input(s): "CKTOTAL", "CKMB", "CKMBINDEX", "TROPONINI" in the last 168 hours. BNP (last 3 results) No results for input(s): "PROBNP" in the last 8760 hours. HbA1C: No results for input(s): "HGBA1C" in the last 72 hours. CBG: No results for input(s): "GLUCAP" in the last 168 hours. Lipid Profile: No results for input(s): "CHOL", "HDL", "LDLCALC", "TRIG", "CHOLHDL", "LDLDIRECT" in the last 72 hours. Thyroid Function Tests: No results for input(s): "TSH", "T4TOTAL", "FREET4", "T3FREE", "THYROIDAB" in the last 72 hours. Anemia Panel: No results for input(s): "VITAMINB12", "FOLATE", "FERRITIN", "TIBC", "IRON", "RETICCTPCT" in the last 72 hours. Urine analysis:    Component Value Date/Time   COLORURINE YELLOW 08/14/2020 2111   APPEARANCEUR CLEAR 08/14/2020 2111   LABSPEC 1.014 08/14/2020 2111   PHURINE 5.0 08/14/2020 2111   GLUCOSEU NEGATIVE 08/14/2020 2111   HGBUR SMALL (A) 08/14/2020 2111   BILIRUBINUR NEGATIVE 08/14/2020 2111   KETONESUR NEGATIVE 08/14/2020 2111   PROTEINUR NEGATIVE 08/14/2020 2111   NITRITE NEGATIVE 08/14/2020 2111   LEUKOCYTESUR NEGATIVE 08/14/2020 2111    Radiological Exams on Admission: DG Chest 2 View Result Date:  03/23/2023 CLINICAL DATA:  SOB, cough EXAM: CHEST - 2 VIEW  COMPARISON:  03/23/2023. FINDINGS: Enlarged cardiac silhouette. Prosthetic aortic valve. Aorta is calcified. Left-sided basilar opacity consistent with consolidation and possible small left-sided pleural effusion. Pneumothorax. Median sternotomy wires. IMPRESSION: Left-sided basilar consolidation and possible small effusion. Prominent cardiac silhouette. Electronically Signed   By: Layla Maw M.D.   On: 03/23/2023 18:42      Assessment/Plan   Principal Problem:   CAP (community acquired pneumonia) Active Problems:   Essential hypertension   Hypokalemia   Chronic diastolic CHF (congestive heart failure) (HCC)   H/O mechanical aortic valve replacement   OSA on CPAP   HLD (hyperlipidemia)   Acute hypoxic respiratory failure (HCC)   Acquired hypothyroidism   Persistent atrial fibrillation (HCC)   Subtherapeutic international normalized ratio (INR)   Depression     #) Community-acquired pneumonia: dx on the basis of 7 to 10 days of new onset shortness of breath as well as productive cough, subjective fever, with presenting chest x-ray showing evidence of left lower lobe airspace opacity concerning for pneumonia, complicated by acute hypoxic respiratory distress, as further quantified below.  Of note, in the absence of objective fever or leukocytosis, SIRS criteria are not met for sepsis at this time.  Appears hemodynamically stable, without e/o hypotension. COVID/ RSV/ influenza PCR all negative.  She is experienced no improvement in her symptoms following 3 days of Augmentin as an outpatient.    Blood cx's x 2 collected in the ED today followed by initiation of azithromycin and Rocephin, which will be continued as CAP coverage.  No e/o additional underlying infxn at this time, including urinalysis that was inconsistent with UTI.  Plan: Follow-up results of blood cx's.   Continue azithromycin, Rocephin. Check Procalcitonin  level. As needed acetaminophen for fever.  Repeat CMP, CBC in the morning.  Check serum magnesium and phosphorus levels.  Prn Tessalon Perles for cough. Incentive spirometry, flutter valve.  Check strep pneumonia urine antigen.  Scheduled guaifenesin.                 #) Acute hypoxic respiratory distress: in the context of acute respiratory symptoms and no known baseline supplemental O2 requirements, presenting O2 sat note to be 88% on room air, Sosan improving into the high 90s on 3 L nasal cannula, thereby meeting criteria for acute hypoxic respiratory distress as opposed to acute hypoxic respiratory failure at this time. Appears to be on basis of presenting community-acquired pneumonia, with present chest x-ray showing evidence of left lower lobe airspace opacity as well as potential associated small left pleural effusion.  No known chronic underlying pulmonary conditions .  In terms of other considered etiologies, ACS appears less likely at this time in the absence of any recent CP and in the context of presenting EKG showing no e/o acute ischemic process.  While she is at increased risk for development of acute on chronic diastolic heart failure including, there is no clinical or radiographic evidence to suggest acutely decompensated heart failure at this time, including BNP that is one third of most recent prior value. Clinically, presentation is less suggestive of acute PE at this time. COVID-19/Influenza PCR are negative.    Plan: further evaluation/management of presenting community-acquired pneumonia, as above.  Monitor on telemetry. CMP/CBC in the AM. Check serum Mg and Phos levels.  Flutter valve, incentive spirometry.  Scheduled guaifenesin.                     #) Hypokalemia: presenting potassium level noted to be  3.3, with evidence of multiple PVCs on presenting EKG.  Of note, presenting magnesium level 2.0.  She is on potassium chloride 40 mill equivalents  p.o. daily at home in the setting of outpatient daily Lasix.  Plan: monitor on tele. KCl 40 meq p.o. x 1 dose now followed by resumption of outpatient potassium chloride 40 mill colons p.o. daily. CMP, mag level in the AM.                     # (Subtherapeutic INR: In the setting of persistent atrial fibrillation as well as history of mechanical aortic valve replacement, patient is chronically anticoagulated on warfarin and with associated goal INR of 2-3.  From the standpoint of her high velocity aortic valve replacement, there does not appear to be indication for bridging of her INR in the setting of mildly subtherapeutic presenting value of 1.8, will also noting anticipation of interval elevation in INR on current dosing given anticipated mild P4 50 inhibitory impacts of azithromycin. Will consult pharmacy for further assistance with management of warfarin dosing during this hospitalization.  Plan: I placed inpatient pharmacy consult for assistance with warfarin dosing and INR monitoring during this hospitalization.                     #) Chronic diastolic heart failure: documented history of such, with most recent echocardiogram performed in October 2024, which is notable for LVEF 55%, no evidence of focal blood show denies manage current diastolic parameters, mildly reduced right ventricular systolic function. No clinical, radiographic, or laboratory evidence to suggest acutely decompensated heart failure at this time. home diuretic regimen reportedly consists of the following: Lasix 80 mg p.o. daily.   Plan: monitor strict I's & O's and daily weights. Repeat CMP in AM. Check serum mag level. Continue home diuretic regimen.  Resumption of outpatient potassium supplementation, as above.                  #) Persistent atrial fibrillation: Documented history of such. In setting of CHA2DS2-VASc score of  5, there is an indication for chronic  anticoagulation for thromboembolic prophylaxis. Consistent with this, patient is chronically anticoagulated on warfarin, with mildly subtherapeutic INR, 0.5 above. Home AV nodal blocking regimen: Coreg, digoxin with anticipated implication of Alateris physiologic effects in the setting of ensuing mild inhibitory impacts on P4 50 posed by azithromycin in the setting of community-acquired pneumonia.  Most recent echocardiogram was performed in October 2024 and was notable for severely dilated bilateral atria, likely leading to the persistent nature of her atrial fibrillation, as well as severe mitral regurgitation. Presenting EKG shows rate controlled atrial fibrillation without overt evidence of acute ischemic changes.   Plan: monitor strict I's & O's and daily weights. CMP/CBC in AM. Check serum mag level. Continue home AV nodal blocking regimen.  Inpatient pharmacy consulted for assistance with warfarin dosing and INR monitoring during this hospitalization.  Monitor on telemetry.                  #) Obstructive sleep apnea: Documented history of such, with patient reporting good compliance on home nocturnal CPAP.   Plan: I have placed respiratory therapy consultation for provision of scheduled nocturnal CPAP.                   #) Depression: documented h/o such. On Abilify, Wellbutrin, and Pristiq as outpatient.    Plan: Resume home Abilify, Wellbutrin, and Pristiq.                  #)  Hyperlipidemia: documented h/o such. On high intensity rosuvastatin as outpatient.   Plan: continue home statin.                   #) Essential Hypertension: documented h/o such, with outpatient antihypertensive regimen including Coreg, Entresto, Lasix.  SBP's in the ED today: Low 100s to 130s mmHg.   Plan: Close monitoring of subsequent BP via routine VS. resume home antihypertensive medications.  Monitor strict I's and O's and daily weights.  Monitor  on telemetry.                   #) acquired hypothyroidism: documented h/o such, on Synthroid as outpatient.   Plan: cont home Synthroid.       DVT prophylaxis: SCD's + resumption of home warfarin via inpatient pharmacy consultation Code Status: Full code Family Communication: none Disposition Plan: Per Rounding Team Consults called: none;  Admission status: Inpatient     I SPENT GREATER THAN 75  MINUTES IN CLINICAL CARE TIME/MEDICAL DECISION-MAKING IN COMPLETING THIS ADMISSION.      Chaney Born Jkwon Treptow DO [facility]  From 7PM - 7AM   03/23/2023, 8:23 PM

## 2023-03-23 NOTE — ED Notes (Signed)
Digoxin and Crestor not given due to not being verified by pharmacy.

## 2023-03-23 NOTE — Progress Notes (Signed)
PHARMACY - ANTICOAGULATION CONSULT NOTE  Pharmacy Consult for Warfarin Indication: atrial fibrillation and mechanical AVR  Allergies  Allergen Reactions   Codeine Anaphylaxis    Jerking, involuntary jerking    Dilantin [Phenytoin Sodium Extended] Rash and Itching   Metoprolol Other (See Comments)    depression    Propoxyphene Other (See Comments)    Bad vivid dreams    Patient Measurements: Height: 5' 4.5" (163.8 cm) Weight: 85.7 kg (189 lb) IBW/kg (Calculated) : 55.85 Heparin Dosing Weight: 74.6 kg  Vital Signs: Temp: 98.7 F (37.1 C) (12/14 2137) Temp Source: Oral (12/14 2137) BP: 109/61 (12/14 2137) Pulse Rate: 87 (12/14 2137)  Labs: Recent Labs    03/23/23 1728  HGB 12.4  HCT 36.3  PLT 225  LABPROT 21.2*  INR 1.8*  CREATININE 1.15*    Estimated Creatinine Clearance: 43.8 mL/min (A) (by C-G formula based on SCr of 1.15 mg/dL (H)).   Medical History: Past Medical History:  Diagnosis Date   Anxiety    Aortic stenosis    status post aortic valve replacement with St. 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   Depression    DJD (degenerative joint disease) of knee    Dyslipidemia    GERD (gastroesophageal reflux disease)    "several years ago; none since" (11/12/2012)   Hyperlipidemia    Hyperlipidemia LDL goal <70 01/19/2017   Hypertension    Left atrial enlargement    Mitral regurgitation 06/25/2016   Mild moderate MR by echo 06/2016   Obesity    Persistent atrial fibrillation (HCC)    s/p afib ablation x 2 at [facility]   Sleep apnea    On CPAP at 12cm H2O   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   Past Month   amoxicillin (AMOXIL) 875 MG tablet Take 875 mg by mouth 2 (two) times daily. 10 days   03/22/2023   ARIPiprazole (ABILIFY) 2 MG tablet Take 1 tablet (2 mg total) by mouth 2 (two) times daily. 180 tablet 3 03/22/2023   buPROPion (WELLBUTRIN XL) 150 MG 24 hr tablet Take 1 tablet (150 mg total) by mouth daily. 90 tablet 3 03/22/2023   calcium-vitamin D (OSCAL WITH D) 500-200 MG-UNIT per tablet Take 2 tablets by mouth in the morning.   03/22/2023   carvedilol (COREG) 3.125 MG tablet Take 1 tablet (3.125 mg total) by mouth 2 (two) times daily with a meal. 180 tablet 1 03/22/2023   Cholecalciferol (VITAMIN D) 2000 UNITS tablet Take 4,000 Units by mouth in the morning.   03/22/2023   desvenlafaxine (PRISTIQ) 100 MG 24 hr tablet Take 1 tablet (100 mg total) by mouth daily. 90 tablet 3 03/22/2023   digoxin (LANOXIN) 0.125 MG tablet Take 1 tablet (0.125 mg total) by mouth daily. 90 tablet 1 03/22/2023   famotidine (PEPCID) 10 MG tablet Take 10 mg by mouth as needed for heartburn or indigestion.   03/22/2023   ferrous sulfate 325 (65 FE) MG tablet Take 325 mg by mouth in the  morning.   03/22/2023   furosemide (LASIX) 80 MG tablet Take 1 tablet by mouth daily. OKAY TO TAKE EXTRA FOR WEIGHT GAIN> 3LB/ DAILY OR 5LB/WEEKLY 180 tablet 3 03/22/2023   levothyroxine (SYNTHROID, LEVOTHROID) 100 MCG tablet Take 100 mcg daily before breakfast by mouth.   03/22/2023   Multiple Vitamin (MULTIVITAMIN WITH MINERALS) TABS Take 1 tablet by mouth in the morning.   03/22/2023   oxymetazoline (AFRIN) 0.05 % nasal spray Place 1 spray into both nostrils daily as needed for congestion.   03/22/2023   potassium chloride SA (KLOR-CON M) 20 MEQ tablet TAKE 2 TABLETS(40 MEQ) BY MOUTH DAILY 180 tablet 2 03/22/2023   rosuvastatin (CRESTOR) 20 MG tablet TAKE 1 TABLET BY MOUTH EVERY DAY 90 tablet 3 03/22/2023   sacubitril-valsartan (ENTRESTO)  97-103 MG TAKE 1 TABLET BY MOUTH TWICE A DAY 180 tablet 3 03/22/2023   traZODone (DESYREL) 50 MG tablet Take 1 tablet (50 mg total) by mouth at bedtime as needed. 90 tablet 3 03/22/2023   tretinoin (RETIN-A) 0.05 % cream Apply 1 application topically at bedtime. Applied to face   03/22/2023   warfarin (COUMADIN) 4 MG tablet TAKE 1 TABLET BY MOUTH EVERY DAY AS DIRECTED BY COUMADIN CLINIC (Patient taking differently: Take 4 mg by mouth daily at 4 PM. TAKE 1 TABLET BY MOUTH EVERY DAY AS DIRECTED BY COUMADIN CLINIC) 90 tablet 0 03/22/2023 at  9:30 PM    Assessment: 77 yo F presents to ED for SOB. Pt has a history for chronic diastolic HF, persistent Afib (on warfarin), mechanical AVR, OSA (CPAP at night), and HTN. Pharmacy consulted to dose warfarin for Afib w/ mechanical AVR.   Home warfarin regimen: 4mg  po daily  Last warfarin dose prior to admission was 12/13 @ 21:30  Hgb 12.5, Plt 225 INR 1.8, subtherapeutic, on admission No s/sx of bleeding  Goal of Therapy:  INR: 2-2.5 per anticoag clinic (due to history of ICH in 2006 with INR ~2.4) Monitor platelets by anticoagulation protocol: Yes   Plan:  Give warfarin 5mg  po x1 tonight Monitor daily CBC, INR, and for s/sx of bleeding   Wilburn Cornelia, PharmD, BCPS Clinical Pharmacist 03/23/2023 10:08 PM   Please refer to AMION for pharmacy phone number

## 2023-03-23 NOTE — ED Notes (Signed)
ED TO INPATIENT HANDOFF REPORT  ED Nurse Name and Phone #: Dahlia Client [phone]  S Name/Age/Gender [name] 77 y.o. female Room/Bed: 034C/034C  Code Status   Code Status: Full Code  Home/SNF/Other Home Patient oriented to: self, place, time, and situation Is this baseline? Yes   Triage Complete: Triage complete  Chief Complaint CAP (community acquired pneumonia) [J18.9]  Triage Note Patient seen on 12/10 for lung infection, sob and cough and placed on augmentin.  Went back today due to feeling worse and was hypoxic at [facility].  Patient does not wear oxygen at home and requiring oxygen now.  Paitent appears weak and tachypneic.     Allergies Allergies  Allergen Reactions   Codeine Anaphylaxis    Jerking, involuntary jerking    Dilantin [Phenytoin Sodium Extended] Rash and Itching   Metoprolol Other (See Comments)    depression    Propoxyphene Other (See Comments)    Bad vivid dreams    Level of Care/Admitting Diagnosis ED Disposition     ED Disposition  Admit   Condition  --   Comment  Hospital Area: [facility] [[id number]]  Level of Care: Telemetry Medical [[id number]]  May admit patient to [facility] or [facility] if equivalent level of care is available:: No  Covid Evaluation: Asymptomatic - no recent exposure (last 10 days) testing not required  Diagnosis: CAP (community acquired pneumonia) [295621]  Admitting Physician: Angie Fava [[id number]]  Attending Physician: Angie Fava [[id number]]  Certification:: I certify this patient will need inpatient services for at least 2 midnights  Expected Medical Readiness: 03/25/2023          B Medical/Surgery History Past Medical History:  Diagnosis Date   Anxiety    Aortic stenosis    status post aortic valve replacement with St. 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   Depression    DJD (degenerative joint disease) of knee    Dyslipidemia    GERD (gastroesophageal reflux disease)    "several years ago; none since" (11/12/2012)   Hyperlipidemia    Hyperlipidemia LDL goal <70 01/19/2017   Hypertension    Left atrial enlargement    Mitral regurgitation 06/25/2016   Mild moderate MR by echo 06/2016   Obesity    Persistent atrial fibrillation (HCC)    s/p afib ablation x 2 at [facility]   Sleep apnea    On CPAP at 12cm H2O   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 Jude AVR   CARDIOVERSION N/A 07/22/2012   Procedure: CARDIOVERSION;  Surgeon: Donato Schultz, MD;  Location: [facility] ENDOSCOPY;  Service: Cardiovascular;  Laterality: N/A;   CARDIOVERSION N/A 11/25/2012   Procedure: CARDIOVERSION;  Surgeon: Quintella Reichert, MD;  Location: [facility] ENDOSCOPY;  Service: Cardiovascular;  Laterality: N/A;   CARDIOVERSION N/A 12/30/2012   Procedure: CARDIOVERSION;  Surgeon: Quintella Reichert, MD;  Location: [facility] ENDOSCOPY;  Service: Cardiovascular;  Laterality: N/A;  h&p in file-HW    CARDIOVERSION N/A 03/30/2013   Procedure: CARDIOVERSION;  Surgeon: Gloris Manchester  Thornton Dales, MD;  Location: [facility] ENDOSCOPY;  Service: Cardiovascular;  Laterality: N/A;   ELECTROPHYSIOLOGIC STUDY  11/2013   Afib ablation x 2 (11/2013 and 10/2015) at [facility] by Dr Smith Robert with recurrence post ablation   KNEE ARTHROSCOPY Left 1980's   "2" (11/12/2012)   KNEE SURGERY Left 1980's   "after 2 scopes they went in and did some kind of OR" (11/12/2012)   RIGHT/LEFT HEART CATH AND CORONARY ANGIOGRAPHY N/A 10/14/2020   Procedure: RIGHT/LEFT HEART CATH AND CORONARY ANGIOGRAPHY;  Surgeon: Swaziland, Peter M, MD;  Location: [facility] INVASIVE CV LAB;  Service:  Cardiovascular;  Laterality: N/A;   TEE WITHOUT CARDIOVERSION N/A 07/22/2012   Procedure: TRANSESOPHAGEAL ECHOCARDIOGRAM (TEE);  Surgeon: Donato Schultz, MD;  Location: [facility] ENDOSCOPY;  Service: Cardiovascular;  Laterality: N/A;  Rm 2034   TEE WITHOUT CARDIOVERSION N/A 10/07/2013   Procedure: TRANSESOPHAGEAL ECHOCARDIOGRAM (TEE);  Surgeon: Donato Schultz, MD;  Location: [facility] ENDOSCOPY;  Service: Cardiovascular;  Laterality: N/A;   TEE WITHOUT CARDIOVERSION N/A 07/12/2020   Procedure: TRANSESOPHAGEAL ECHOCARDIOGRAM (TEE);  Surgeon: Thurmon Fair, MD;  Location: [facility] ENDOSCOPY;  Service: Cardiovascular;  Laterality: N/A;   TOTAL KNEE ARTHROPLASTY Left 11/05/2014   Procedure: TOTAL KNEE ARTHROPLASTY;  Surgeon: Jodi Geralds, MD;  Location: [facility] OR;  Service: Orthopedics;  Laterality: Left;   TUBAL LIGATION  1980's     A IV Location/Drains/Wounds Patient Lines/Drains/Airways Status     Active Line/Drains/Airways     Name Placement date Placement time Site Days   Peripheral IV 03/23/23 20 G Right Antecubital 03/23/23  1816  Antecubital  less than 1   Wound / Incision (Open or Dehisced) 09/25/19 Non-pressure wound Foot Right 09/25/19  2249  Foot  1275            Intake/Output Last 24 hours No intake or output data in the 24 hours ending 03/23/23 2024  Labs/Imaging Results for orders placed or performed during the hospital encounter of 03/23/23 (from the past 48 hours)  Comprehensive metabolic panel     Status: Abnormal   Collection Time: 03/23/23  5:28 PM  Result Value Ref Range   Sodium 136 135 - 145 mmol/L   Potassium 3.3 (L) 3.5 - 5.1 mmol/L   Chloride 96 (L) 98 - 111 mmol/L   CO2 26 22 - 32 mmol/L   Glucose, Bld 115 (H) 70 - 99 mg/dL    Comment: Glucose reference range applies only to samples taken after fasting for at least 8 hours.   BUN 28 (H) 8 - 23 mg/dL   Creatinine, Ser 4.09 (H) 0.44 - 1.00 mg/dL   Calcium 8.8 (L) 8.9 - 10.3 mg/dL   Total Protein 6.9 6.5 - 8.1 g/dL   Albumin 3.4 (L)  3.5 - 5.0 g/dL   AST 38 15 - 41 U/L   ALT 28 0 - 44 U/L   Alkaline Phosphatase 69 38 - 126 U/L   Total Bilirubin 0.9 <1.2 mg/dL   GFR, Estimated 49 (L) >60 mL/min    Comment: (NOTE) Calculated using the CKD-EPI Creatinine Equation (2021)    Anion gap 14 5 - 15    Comment: Performed at [facility], [address], [place], [place] [address]  CBC with Differential     Status: Abnormal   Collection Time: 03/23/23  5:28 PM  Result Value Ref Range   WBC 9.9 4.0 - 10.5 K/uL   RBC 4.10 3.87 - 5.11 MIL/uL   Hemoglobin 12.4 12.0 - 15.0 g/dL   HCT 36.3  36.0 - 46.0 %   MCV 88.5 80.0 - 100.0 fL   MCH 30.2 26.0 - 34.0 pg   MCHC 34.2 30.0 - 36.0 g/dL   RDW 16.1 09.6 - 04.5 %   Platelets 225 150 - 400 K/uL   nRBC 0.0 0.0 - 0.2 %   Neutrophils Relative % 73 %   Neutro Abs 7.1 1.7 - 7.7 K/uL   Lymphocytes Relative 11 %   Lymphs Abs 1.1 0.7 - 4.0 K/uL   Monocytes Relative 13 %   Monocytes Absolute 1.3 (H) 0.1 - 1.0 K/uL   Eosinophils Relative 3 %   Eosinophils Absolute 0.3 0.0 - 0.5 K/uL   Basophils Relative 0 %   Basophils Absolute 0.0 0.0 - 0.1 K/uL   Immature Granulocytes 0 %   Abs Immature Granulocytes 0.02 0.00 - 0.07 K/uL    Comment: Performed at [facility], [address], [place], [place] [address]  Protime-INR     Status: Abnormal   Collection Time: 03/23/23  5:28 PM  Result Value Ref Range   Prothrombin Time 21.2 (H) 11.4 - 15.2 seconds   INR 1.8 (H) 0.8 - 1.2    Comment: (NOTE) INR goal varies based on device and disease states. Performed at [facility], [address] [place], [place] [address]   I-Stat Lactic Acid, ED     Status: None   Collection Time: 03/23/23  5:40 PM  Result Value Ref Range   Lactic Acid, Venous 1.2 0.5 - 1.9 mmol/L  Brain natriuretic peptide     Status: Abnormal   Collection Time: 03/23/23  5:44 PM  Result Value Ref Range   B Natriuretic Peptide 205.5 (H) 0.0 - 100.0 pg/mL    Comment: Performed at [facility],  [address] [place], [place] [address]  Resp panel by RT-PCR (RSV, Flu A&B, Covid) Anterior Nasal Swab     Status: None   Collection Time: 03/23/23  6:17 PM   Specimen: Anterior Nasal Swab  Result Value Ref Range   SARS Coronavirus 2 by RT PCR NEGATIVE NEGATIVE   Influenza A by PCR NEGATIVE NEGATIVE   Influenza B by PCR NEGATIVE NEGATIVE    Comment: (NOTE) The Xpert Xpress SARS-CoV-2/FLU/RSV plus assay is intended as an aid in the diagnosis of influenza from Nasopharyngeal swab specimens and should not be used as a sole basis for treatment. Nasal washings and aspirates are unacceptable for Xpert Xpress SARS-CoV-2/FLU/RSV testing.  Fact Sheet for Patients: BloggerCourse.com  Fact Sheet for Healthcare Providers: SeriousBroker.it  This test is not yet approved or cleared by the [place] FDA and has been authorized for detection and/or diagnosis of SARS-CoV-2 by FDA under an Emergency Use Authorization (EUA). This EUA will remain in effect (meaning this test can be used) for the duration of the COVID-19 declaration under Section 564(b)(1) of the Act, 21 U.S.C. section 360bbb-3(b)(1), unless the authorization is terminated or revoked.     Resp Syncytial Virus by PCR NEGATIVE NEGATIVE    Comment: (NOTE) Fact Sheet for Patients: BloggerCourse.com  Fact Sheet for Healthcare Providers: SeriousBroker.it  This test is not yet approved or cleared by the [place] FDA and has been authorized for detection and/or diagnosis of SARS-CoV-2 by FDA under an Emergency Use Authorization (EUA). This EUA will remain in effect (meaning this test can be used) for the duration of the COVID-19 declaration under Section 564(b)(1) of the Act, 21 U.S.C. section 360bbb-3(b)(1), unless the authorization is terminated  or revoked.  Performed at [facility], [address], [place],  [place] [address]    DG Chest 2 View Result Date: 03/23/2023 CLINICAL DATA:  SOB, cough EXAM: CHEST - 2 VIEW COMPARISON:  03/23/2023. FINDINGS: Enlarged cardiac silhouette. Prosthetic aortic valve. Aorta is calcified. Left-sided basilar opacity consistent with consolidation and possible small left-sided pleural effusion. Pneumothorax. Median sternotomy wires. IMPRESSION: Left-sided basilar consolidation and possible small effusion. Prominent cardiac silhouette. Electronically Signed   By: Layla Maw M.D.   On: 03/23/2023 18:42    Pending Labs Unresulted Labs (From admission, onward)     Start     Ordered   03/24/23 0500  CBC with Differential/Platelet  Tomorrow morning,   R        03/23/23 2020   03/24/23 0500  Comprehensive metabolic panel  Tomorrow morning,   R        03/23/23 2020   03/24/23 0500  Magnesium  Tomorrow morning,   R        03/23/23 2020   03/24/23 0500  Phosphorus  Tomorrow morning,   R        03/23/23 2020   03/23/23 2021  Magnesium  Add-on,   AD        03/23/23 2020   03/23/23 1720  Culture, blood (Routine x 2)  BLOOD CULTURE X 2,   R (with STAT occurrences)      03/23/23 1720   03/23/23 1720  Urinalysis, w/ Reflex to Culture (Infection Suspected) -Urine, Clean Catch  Once,   URGENT       Question:  Specimen Source  Answer:  Urine, Clean Catch   03/23/23 1720            Vitals/Pain Today's Vitals   03/23/23 1718 03/23/23 1815 03/23/23 1906 03/23/23 1910  BP:  100/64 (!) 131/105   Pulse:  (!) 46 95   Resp:  20 19   Temp:      SpO2:  100% 99%   Weight: 189 lb (85.7 kg)     Height: 5' 4.5" (1.638 m)     PainSc: 0-No pain   0-No pain    Isolation Precautions No active isolations  Medications Medications  azithromycin (ZITHROMAX) 500 mg in sodium chloride 0.9 % 250 mL IVPB (500 mg Intravenous New Bag/Given 03/23/23 1949)  acetaminophen (TYLENOL) tablet 650 mg (has no administration in time range)    Or  acetaminophen (TYLENOL) suppository 650 mg (has  no administration in time range)  melatonin tablet 3 mg (has no administration in time range)  ondansetron (ZOFRAN) injection 4 mg (has no administration in time range)  azithromycin (ZITHROMAX) 500 mg in sodium chloride 0.9 % 250 mL IVPB (has no administration in time range)  cefTRIAXone (ROCEPHIN) 1 g in sodium chloride 0.9 % 100 mL IVPB (has no administration in time range)  cefTRIAXone (ROCEPHIN) 1 g in sodium chloride 0.9 % 100 mL IVPB (0 g Intravenous Stopped 03/23/23 1939)  sodium chloride 0.9 % bolus 500 mL (500 mLs Intravenous New Bag/Given 03/23/23 1908)  benzonatate (TESSALON) capsule 200 mg (200 mg Oral Given 03/23/23 2016)    Mobility walks     Focused Assessments     R Recommendations: See Admitting Provider Note  Report given to:   Additional Notes:

## 2023-03-23 NOTE — ED Notes (Signed)
Called lab and added on BNP

## 2023-03-23 NOTE — ED Notes (Signed)
1st lac 1.2 in normal range, 2nd can be discontinued unless Dr says otherwise

## 2023-03-23 NOTE — ED Notes (Signed)
Paused IV meds so PT could eat dinner.  Alarms kept going off.

## 2023-03-23 NOTE — ED Triage Notes (Signed)
Patient seen on 12/10 for lung infection, sob and cough and placed on augmentin.  Went back today due to feeling worse and was hypoxic at [facility].  Patient does not wear oxygen at home and requiring oxygen now.  Paitent appears weak and tachypneic.

## 2023-03-23 NOTE — ED Notes (Signed)
PT restarted on antibiotics as she had finished dinner.

## 2023-03-23 NOTE — ED Provider Notes (Signed)
[place] [facility]ERGENCY DEPARTMENT AT [facility] Provider Note   CSN: [id number] Arrival date & time: 03/23/23  1703    History  Chief Complaint  Patient presents with   Shortness of Breath    [name] is a 77 y.o. female here mechanical valve on Coumadin, A-fib, hypertension, CHF here for evaluation of shortness of breath and cough.  Symptoms started greater than 1 week ago.  She was seen by urgent care on Monday who gave her antibiotics, they told her to not start these unless she was not feeling better by Thursday, 2 days ago.  She states she is feeling poorly on Wednesday, has had fever so she started the antibiotics then.  She states she had not improved and was seen again by urgent care was noted to be hypoxic.  She has cough productive of yellow sputum.  She is taken 3 days of Augmentin.  She has had fevers which ended yesterday, no fever today.  She is generally weak.  No chest pain, lower extremity edema.  Does note that she has had to sit upright due to her shortness of breath.  She wears PAP at night for sleep apnea.  Does not typically wear oxygen during the day.  No headache, chest pain, abdominal pain, nausea or vomiting.  No dysuria hematuria.  She is compliant with her medications at home.  HPI     Home Medications Prior to Admission medications   Medication Sig Start Date End Date Taking? Authorizing Provider  acetaminophen (TYLENOL) 500 MG tablet Take 500 mg by mouth as needed.   Yes [provider]  amoxicillin (AMOXIL) 875 MG tablet Take 875 mg by mouth 2 (two) times daily. 10 days 03/19/23  Yes [provider]  ARIPiprazole (ABILIFY) 2 MG tablet Take 1 tablet (2 mg total) by mouth 2 (two) times daily. 03/04/23  Yes Mozingo, Thereasa Solo, NP  buPROPion (WELLBUTRIN XL) 150 MG 24 hr tablet Take 1 tablet (150 mg total) by mouth daily. 01/01/23  Yes Mozingo, Thereasa Solo, NP  calcium-vitamin D (OSCAL WITH D) 500-200 MG-UNIT per  tablet Take 2 tablets by mouth in the morning.   Yes [provider]  carvedilol (COREG) 3.125 MG tablet Take 1 tablet (3.125 mg total) by mouth 2 (two) times daily with a meal. 12/07/22  Yes Croitoru, Mihai, MD  Cholecalciferol (VITAMIN D) 2000 UNITS tablet Take 4,000 Units by mouth in the morning.   Yes [provider]  desvenlafaxine (PRISTIQ) 100 MG 24 hr tablet Take 1 tablet (100 mg total) by mouth daily. 07/27/22  Yes Mozingo, Thereasa Solo, NP  digoxin (LANOXIN) 0.125 MG tablet Take 1 tablet (0.125 mg total) by mouth daily. 12/07/22  Yes Croitoru, Mihai, MD  famotidine (PEPCID) 10 MG tablet Take 10 mg by mouth as needed for heartburn or indigestion.   Yes [provider]  ferrous sulfate 325 (65 FE) MG tablet Take 325 mg by mouth in the morning.   Yes [provider]  furosemide (LASIX) 80 MG tablet Take 1 tablet by mouth daily. OKAY TO TAKE EXTRA FOR WEIGHT GAIN> 3LB/ DAILY OR 5LB/WEEKLY 05/30/22  Yes Croitoru, Mihai, MD  levothyroxine (SYNTHROID, LEVOTHROID) 100 MCG tablet Take 100 mcg daily before breakfast by mouth.   Yes [provider]  Multiple Vitamin (MULTIVITAMIN WITH MINERALS) TABS Take 1 tablet by mouth in the morning.   Yes [provider]  oxymetazoline (AFRIN) 0.05 % nasal spray Place 1 spray into both nostrils daily  as needed for congestion.   Yes [provider]  potassium chloride SA (KLOR-CON M) 20 MEQ tablet TAKE 2 TABLETS(40 MEQ) BY MOUTH DAILY 09/11/22  Yes Croitoru, Mihai, MD  rosuvastatin (CRESTOR) 20 MG tablet TAKE 1 TABLET BY MOUTH EVERY DAY 01/29/23  Yes Tobb, Kardie, DO  sacubitril-valsartan (ENTRESTO) 97-103 MG TAKE 1 TABLET BY MOUTH TWICE A DAY 02/11/23  Yes Croitoru, Mihai, MD  traZODone (DESYREL) 50 MG tablet Take 1 tablet (50 mg total) by mouth at bedtime as needed. 07/27/22  Yes Mozingo, Thereasa Solo, NP  tretinoin (RETIN-A) 0.05 % cream Apply 1 application topically at bedtime. Applied to face   Yes  [provider]  warfarin (COUMADIN) 4 MG tablet TAKE 1 TABLET BY MOUTH EVERY DAY AS DIRECTED BY COUMADIN CLINIC Patient taking differently: Take 4 mg by mouth daily at 4 PM. TAKE 1 TABLET BY MOUTH EVERY DAY AS DIRECTED BY COUMADIN CLINIC 12/14/22  Yes Croitoru, Mihai, MD      Allergies    Codeine, Dilantin [phenytoin sodium extended], Metoprolol, and Propoxyphene    Review of Systems   Review of Systems  Constitutional:  Positive for appetite change, chills, fatigue and fever.  HENT:  Positive for congestion, postnasal drip, rhinorrhea, sinus pressure and sneezing.   Respiratory:  Positive for cough and shortness of breath.   Cardiovascular: Negative.   Gastrointestinal: Negative.   Genitourinary: Negative.   Musculoskeletal: Negative.   Skin: Negative.   Neurological:  Positive for weakness (generalized).  All other systems reviewed and are negative.   Physical Exam Updated Vital Signs BP (!) 128/112   Pulse 98   Temp 98.5 F (36.9 C)   Resp (!) 21   Ht 5' 4.5" (1.638 m)   Wt 85.7 kg   SpO2 100%   BMI 31.94 kg/m  Physical Exam Vitals and nursing note reviewed.  Constitutional:      General: She is not in acute distress.    Appearance: She is well-developed. She is ill-appearing. She is not toxic-appearing or diaphoretic.  HENT:     Head: Atraumatic.  Eyes:     Pupils: Pupils are equal, round, and reactive to light.  Cardiovascular:     Rate and Rhythm: Normal rate.     Pulses: Normal pulses.     Heart sounds: Normal heart sounds.  Pulmonary:     Effort: No tachypnea or respiratory distress.     Breath sounds: Rhonchi present.  Abdominal:     General: There is no distension.  Musculoskeletal:        General: Normal range of motion.     Cervical back: Normal range of motion.     Right lower leg: No tenderness. No edema.     Left lower leg: No tenderness. No edema.  Skin:    General: Skin is warm and dry.     Capillary Refill: Capillary refill takes  less than 2 seconds.  Neurological:     General: No focal deficit present.     Mental Status: She is alert.  Psychiatric:        Mood and Affect: Mood normal.     ED Results / Procedures / Treatments   Labs (all labs ordered are listed, but only abnormal results are displayed) Labs Reviewed  COMPREHENSIVE METABOLIC PANEL - Abnormal; Notable for the following components:      Result Value   Potassium 3.3 (*)    Chloride 96 (*)    Glucose, Bld 115 (*)  BUN 28 (*)    Creatinine, Ser 1.15 (*)    Calcium 8.8 (*)    Albumin 3.4 (*)    GFR, Estimated 49 (*)    All other components within normal limits  CBC WITH DIFFERENTIAL/PLATELET - Abnormal; Notable for the following components:   Monocytes Absolute 1.3 (*)    All other components within normal limits  PROTIME-INR - Abnormal; Notable for the following components:   Prothrombin Time 21.2 (*)    INR 1.8 (*)    All other components within normal limits  URINALYSIS, W/ REFLEX TO CULTURE (INFECTION SUSPECTED) - Abnormal; Notable for the following components:   APPearance HAZY (*)    Protein, ur 30 (*)    Leukocytes,Ua TRACE (*)    Bacteria, UA RARE (*)    All other components within normal limits  BRAIN NATRIURETIC PEPTIDE - Abnormal; Notable for the following components:   B Natriuretic Peptide 205.5 (*)    All other components within normal limits  RESP PANEL BY RT-PCR (RSV, FLU A&B, COVID)  RVPGX2  CULTURE, BLOOD (ROUTINE X 2)  CULTURE, BLOOD (ROUTINE X 2)  MAGNESIUM  CBC WITH DIFFERENTIAL/PLATELET  COMPREHENSIVE METABOLIC PANEL  MAGNESIUM  PHOSPHORUS  PROCALCITONIN  I-STAT CG4 LACTIC ACID, ED  I-STAT CG4 LACTIC ACID, ED    EKG EKG Interpretation Date/Time:  Saturday March 23 2023 17:14:33 EST Ventricular Rate:  105 PR Interval:    QRS Duration:  150 QT Interval:  342 QTC Calculation: 452 R Axis:   32  Text Interpretation: Atrial fibrillation with rapid ventricular response with premature ventricular or  aberrantly conducted complexes Right bundle branch block T wave abnormality, consider lateral ischemia Abnormal ECG When compared with ECG of 04-Feb-2023 13:42, PREVIOUS ECG IS PRESENT Confirmed by Kristine Royal [phone]) on 03/23/2023 5:30:57 PM  Radiology DG Chest 2 View Result Date: 03/23/2023 CLINICAL DATA:  SOB, cough EXAM: CHEST - 2 VIEW COMPARISON:  03/23/2023. FINDINGS: Enlarged cardiac silhouette. Prosthetic aortic valve. Aorta is calcified. Left-sided basilar opacity consistent with consolidation and possible small left-sided pleural effusion. Pneumothorax. Median sternotomy wires. IMPRESSION: Left-sided basilar consolidation and possible small effusion. Prominent cardiac silhouette. Electronically Signed   By: Layla Maw M.D.   On: 03/23/2023 18:42    Procedures .Critical Care  Performed by: Linwood Dibbles, PA-C Authorized by: Linwood Dibbles, PA-C   Critical care provider statement:    Critical care time (minutes):  32   Critical care was necessary to treat or prevent imminent or life-threatening deterioration of the following conditions:  Respiratory failure and cardiac failure (Hypoxic respiratory failure, A-fib with RVR)   Critical care was time spent personally by me on the following activities:  Development of treatment plan with patient or surrogate, discussions with consultants, evaluation of patient's response to treatment, examination of patient, ordering and review of laboratory studies, ordering and review of radiographic studies, ordering and performing treatments and interventions, pulse oximetry, re-evaluation of patient's condition and review of old charts     Medications Ordered in ED Medications  acetaminophen (TYLENOL) tablet 650 mg (has no administration in time range)    Or  acetaminophen (TYLENOL) suppository 650 mg (has no administration in time range)  melatonin tablet 3 mg (has no administration in time range)  ondansetron (ZOFRAN) injection 4  mg (has no administration in time range)  azithromycin (ZITHROMAX) 500 mg in sodium chloride 0.9 % 250 mL IVPB (has no administration in time range)  cefTRIAXone (ROCEPHIN) 1 g in sodium chloride 0.9 %  100 mL IVPB (has no administration in time range)  guaiFENesin (MUCINEX) 12 hr tablet 600 mg (has no administration in time range)  benzonatate (TESSALON) capsule 200 mg (has no administration in time range)  ARIPiprazole (ABILIFY) tablet 2 mg (has no administration in time range)  buPROPion (WELLBUTRIN XL) 24 hr tablet 150 mg (has no administration in time range)  carvedilol (COREG) tablet 3.125 mg (has no administration in time range)  digoxin (LANOXIN) tablet 0.125 mg (has no administration in time range)  furosemide (LASIX) tablet 80 mg (has no administration in time range)  levothyroxine (SYNTHROID) tablet 100 mcg (has no administration in time range)  potassium chloride SA (KLOR-CON M) CR tablet 40 mEq (has no administration in time range)  rosuvastatin (CRESTOR) tablet 20 mg (has no administration in time range)  sacubitril-valsartan (ENTRESTO) 97-103 mg per tablet (has no administration in time range)  cefTRIAXone (ROCEPHIN) 1 g in sodium chloride 0.9 % 100 mL IVPB (0 g Intravenous Stopped 03/23/23 1939)  azithromycin (ZITHROMAX) 500 mg in sodium chloride 0.9 % 250 mL IVPB (500 mg Intravenous New Bag/Given 03/23/23 1949)  sodium chloride 0.9 % bolus 500 mL (500 mLs Intravenous New Bag/Given 03/23/23 1908)  benzonatate (TESSALON) capsule 200 mg (200 mg Oral Given 03/23/23 2016)  potassium chloride SA (KLOR-CON M) CR tablet 40 mEq (40 mEq Oral Given 03/23/23 2104)    ED Course/ Medical Decision Making/ A&P Clinical Course as of 03/23/23 2118  Sat Mar 23, 2023  2013 Dr. Arlean Hopping to admit [BH]    Clinical Course User Index [BH] Normal Recinos A, PA-C   77 year old multiple medical problems here for evaluation of URI symptoms.  Seen 03/18/23, was told likely viral however given  Augmentin if she had not improved by the end of the week.  She states she started the medication on Wednesday however continued to worsen.  Seen again by urgent care today noted to be hypoxic.  She has known history of CHF however denies any PND or lower extremity swelling.  Wears CPAP at night for OSA.  She is currently anticoagulated on Coumadin for A-fib as well as mechanical valve.  On arrival she is afebrile however appears to not feel well.  Hypoxic, tachycardic.  She will get sepsis labs, will start on antibiotics.  No hypotension will hold on 30/cc/kg bolus of fluids however will give 500 cc as she does appear to be mildly dehydrated.  Labs and imaging personally viewed and interpreted:  CBC without leukocytosis Metabolic panel creatinine 1.15, potassium 3.3 Lactic acid 1.2 INR 1.8, not therapeutic Chest x-ray left consolidation EKG A-fib rates at 105 on arrival>>> reassessment shows HR in 80's-90's  Patient admitted for acute hypoxic respiratory failure likely due to pneumonia as well as A-fib with RVR (now resolved)  CONSULT Dr. Arlean Hopping with medicine who is agreeable to evaluate patient for admission  The patient appears reasonably stabilized for admission considering the current resources, flow, and capabilities available in the ED at this time, and I doubt any other [facility] requiring further screening and/or treatment in the ED prior to admission.                                 Medical Decision Making Amount and/or Complexity of Data Reviewed External Data Reviewed: labs, radiology, ECG and notes. Labs: ordered. Decision-making details documented in ED Course. Radiology: ordered and independent interpretation performed. Decision-making details documented in ED Course. ECG/medicine tests: ordered  and independent interpretation performed. Decision-making details documented in ED Course.  Risk OTC drugs. Prescription drug management. Parenteral controlled substances. Decision  regarding hospitalization. Diagnosis or treatment significantly limited by social determinants of health.           Final Clinical Impression(s) / ED Diagnoses Final diagnoses:  Paroxysmal A-fib (HCC)  Chronic anticoagulation  Pneumonia of left lower lobe due to infectious organism  Acute respiratory failure with hypoxia (HCC)  Subtherapeutic anticoagulation    Rx / DC Orders ED Discharge Orders     None         Jahrell Hamor A, PA-C 03/23/23 2118    Wynetta Fines, MD 03/23/23 2332

## 2023-03-24 DIAGNOSIS — J189 Pneumonia, unspecified organism: Secondary | ICD-10-CM | POA: Diagnosis not present

## 2023-03-24 LAB — CBC WITH DIFFERENTIAL/PLATELET
Abs Immature Granulocytes: 0.03 10*3/uL (ref 0.00–0.07)
Basophils Absolute: 0 10*3/uL (ref 0.0–0.1)
Basophils Relative: 0 %
Eosinophils Absolute: 0.6 10*3/uL — ABNORMAL HIGH (ref 0.0–0.5)
Eosinophils Relative: 6 %
HCT: 34.3 % — ABNORMAL LOW (ref 36.0–46.0)
Hemoglobin: 11.4 g/dL — ABNORMAL LOW (ref 12.0–15.0)
Immature Granulocytes: 0 %
Lymphocytes Relative: 11 %
Lymphs Abs: 1 10*3/uL (ref 0.7–4.0)
MCH: 29.9 pg (ref 26.0–34.0)
MCHC: 33.2 g/dL (ref 30.0–36.0)
MCV: 90 fL (ref 80.0–100.0)
Monocytes Absolute: 1.4 10*3/uL — ABNORMAL HIGH (ref 0.1–1.0)
Monocytes Relative: 15 %
Neutro Abs: 6.5 10*3/uL (ref 1.7–7.7)
Neutrophils Relative %: 68 %
Platelets: 221 10*3/uL (ref 150–400)
RBC: 3.81 MIL/uL — ABNORMAL LOW (ref 3.87–5.11)
RDW: 12.9 % (ref 11.5–15.5)
WBC: 9.6 10*3/uL (ref 4.0–10.5)
nRBC: 0 % (ref 0.0–0.2)

## 2023-03-24 LAB — PHOSPHORUS: Phosphorus: 3.5 mg/dL (ref 2.5–4.6)

## 2023-03-24 LAB — PROTIME-INR
INR: 2.1 — ABNORMAL HIGH (ref 0.8–1.2)
Prothrombin Time: 23.6 s — ABNORMAL HIGH (ref 11.4–15.2)

## 2023-03-24 LAB — COMPREHENSIVE METABOLIC PANEL
ALT: 36 U/L (ref 0–44)
AST: 46 U/L — ABNORMAL HIGH (ref 15–41)
Albumin: 2.9 g/dL — ABNORMAL LOW (ref 3.5–5.0)
Alkaline Phosphatase: 72 U/L (ref 38–126)
Anion gap: 9 (ref 5–15)
BUN: 24 mg/dL — ABNORMAL HIGH (ref 8–23)
CO2: 27 mmol/L (ref 22–32)
Calcium: 8.6 mg/dL — ABNORMAL LOW (ref 8.9–10.3)
Chloride: 103 mmol/L (ref 98–111)
Creatinine, Ser: 1.12 mg/dL — ABNORMAL HIGH (ref 0.44–1.00)
GFR, Estimated: 51 mL/min — ABNORMAL LOW (ref >60)
Glucose, Bld: 111 mg/dL — ABNORMAL HIGH (ref 70–99)
Potassium: 3.6 mmol/L (ref 3.5–5.1)
Sodium: 139 mmol/L (ref 135–145)
Total Bilirubin: 0.8 mg/dL (ref <1.2)
Total Protein: 6.1 g/dL — ABNORMAL LOW (ref 6.5–8.1)

## 2023-03-24 LAB — MAGNESIUM: Magnesium: 2.1 mg/dL (ref 1.7–2.4)

## 2023-03-24 MED ORDER — METHYLPREDNISOLONE SODIUM SUCC 40 MG IJ SOLR
40.0000 mg | Freq: Two times a day (BID) | INTRAMUSCULAR | Status: DC
  Administered 2023-03-24 – 2023-03-26 (×6): 40 mg via INTRAVENOUS
  Filled 2023-03-24 (×6): qty 1

## 2023-03-24 MED ORDER — ALBUTEROL SULFATE (2.5 MG/3ML) 0.083% IN NEBU
2.5000 mg | INHALATION_SOLUTION | Freq: Four times a day (QID) | RESPIRATORY_TRACT | Status: DC | PRN
  Administered 2023-03-24 – 2023-03-25 (×2): 2.5 mg via RESPIRATORY_TRACT
  Filled 2023-03-24: qty 3

## 2023-03-24 MED ORDER — ARFORMOTEROL TARTRATE 15 MCG/2ML IN NEBU
15.0000 ug | INHALATION_SOLUTION | Freq: Two times a day (BID) | RESPIRATORY_TRACT | Status: DC
  Administered 2023-03-24 – 2023-03-27 (×6): 15 ug via RESPIRATORY_TRACT
  Filled 2023-03-24 (×7): qty 2

## 2023-03-24 MED ORDER — BUDESONIDE 0.25 MG/2ML IN SUSP
0.2500 mg | Freq: Two times a day (BID) | RESPIRATORY_TRACT | Status: DC
  Administered 2023-03-24 – 2023-03-27 (×6): 0.25 mg via RESPIRATORY_TRACT
  Filled 2023-03-24 (×7): qty 2

## 2023-03-24 MED ORDER — GUAIFENESIN-DM 100-10 MG/5ML PO SYRP
5.0000 mL | ORAL_SOLUTION | ORAL | Status: DC | PRN
  Administered 2023-03-24 – 2023-03-26 (×6): 5 mL via ORAL
  Filled 2023-03-24 (×6): qty 10

## 2023-03-24 MED ORDER — REVEFENACIN 175 MCG/3ML IN SOLN
175.0000 ug | Freq: Every day | RESPIRATORY_TRACT | Status: DC
  Administered 2023-03-24 – 2023-03-27 (×4): 175 ug via RESPIRATORY_TRACT
  Filled 2023-03-24 (×4): qty 3

## 2023-03-24 MED ORDER — WARFARIN SODIUM 4 MG PO TABS
4.0000 mg | ORAL_TABLET | Freq: Once | ORAL | Status: AC
  Administered 2023-03-24: 4 mg via ORAL
  Filled 2023-03-24: qty 1

## 2023-03-24 NOTE — Plan of Care (Signed)

## 2023-03-24 NOTE — Hospital Course (Addendum)
77 y.o. female with medical history significant for chronic diastolic heart failure, persistent atrial fibrillation chronically anticoagulated on warfarin, mechanical aortic valve replacement, obstructive sleep apnea on nocturnal CPAP, essential pretension presented to the ED with shortness of breath, productive cough subjective fever onset 7 days 10 days PTA. Seen at the local urgent care 1 to 2 days into the symptoms likely had respiratory tract infection apparently viral panel was negative discharged with Augmentin. In the ED afebrile heart rate 90s to 100 vitals otherwise stable with tachypnea 19-30, 80% on room air. Chest x-ray left lower lobe pneumonia possible left pleural effusion, EKG A-fib with PVC, labs with mild hypokalemia BNP 205 lactic acid 1.2 WBC count 9.9 INR 1.8 COVID RSV PCR influenza negative. Patient was admitted for pneumonia.

## 2023-03-24 NOTE — Progress Notes (Signed)
PHARMACY - ANTICOAGULATION CONSULT NOTE  Pharmacy Consult for Warfarin Indication: atrial fibrillation and mechanical AVR  Allergies  Allergen Reactions   Codeine Anaphylaxis    Jerking, involuntary jerking    Dilantin [Phenytoin Sodium Extended] Rash and Itching   Metoprolol Other (See Comments)    depression    Propoxyphene Other (See Comments)    Bad vivid dreams    Patient Measurements: Height: 5' 4.5" (163.8 cm) Weight: 83.8 kg (184 lb 11.9 oz) IBW/kg (Calculated) : 55.85 Heparin Dosing Weight: 74.6 kg  Vital Signs: Temp: 98.3 F (36.8 C) (12/15 0748) Temp Source: Oral (12/15 0748) BP: 96/64 (12/15 0748) Pulse Rate: 86 (12/15 0748)  Labs: Recent Labs    03/23/23 1728 03/24/23 0807  HGB 12.4 11.4*  HCT 36.3 34.3*  PLT 225 221  LABPROT 21.2* 23.6*  INR 1.8* 2.1*  CREATININE 1.15* 1.12*    Estimated Creatinine Clearance: 44.6 mL/min (A) (by C-G formula based on SCr of 1.12 mg/dL (H)).   Medical History: Past Medical History:  Diagnosis Date   Anxiety    Aortic stenosis    status post aortic valve replacement with 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   Depression    DJD (degenerative joint disease) of knee    Dyslipidemia    GERD (gastroesophageal reflux disease)    "several years ago; none since" (11/12/2012)   Hyperlipidemia    Hyperlipidemia LDL goal <70 01/19/2017   Hypertension    Left atrial enlargement    Mitral regurgitation 06/25/2016   Mild moderate MR by echo 06/2016   Obesity    Persistent atrial fibrillation (HCC)    s/p afib ablation x 2 at [facility]   Sleep apnea    On CPAP at 12cm H2O   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   Past Month   amoxicillin (AMOXIL) 875 MG tablet Take 875 mg by mouth 2 (two) times daily. 10 days   03/22/2023   ARIPiprazole (ABILIFY) 2 MG tablet Take 1 tablet (2 mg total) by mouth 2 (two) times daily. 180 tablet 3 03/22/2023   buPROPion (WELLBUTRIN XL) 150 MG 24 hr tablet Take 1 tablet (150 mg total) by mouth daily. 90 tablet 3 03/22/2023   calcium-vitamin D (OSCAL WITH D) 500-200 MG-UNIT per tablet Take 2 tablets by mouth in the morning.   03/22/2023   carvedilol (COREG) 3.125 MG tablet Take 1 tablet (3.125 mg total) by mouth 2 (two) times daily with a meal. 180 tablet 1 03/22/2023   Cholecalciferol (VITAMIN D) 2000 UNITS tablet Take 4,000 Units by mouth in the morning.   03/22/2023   desvenlafaxine (PRISTIQ) 100 MG 24 hr tablet Take 1 tablet (100 mg total) by mouth daily. 90 tablet 3 03/22/2023   digoxin (LANOXIN) 0.125 MG tablet Take 1 tablet (0.125 mg total) by mouth daily. 90 tablet 1 03/22/2023   famotidine (PEPCID) 10 MG tablet Take 10 mg by mouth as needed for heartburn or indigestion.   03/22/2023   ferrous sulfate 325 (65  FE) MG tablet Take 325 mg by mouth in the morning.   03/22/2023   furosemide (LASIX) 80 MG tablet Take 1 tablet by mouth daily. OKAY TO TAKE EXTRA FOR WEIGHT GAIN> 3LB/ DAILY OR 5LB/WEEKLY 180 tablet 3 03/22/2023   levothyroxine (SYNTHROID, LEVOTHROID) 100 MCG tablet Take 100 mcg daily before breakfast by mouth.   03/22/2023   Multiple Vitamin (MULTIVITAMIN WITH MINERALS) TABS Take 1 tablet by mouth in the morning.   03/22/2023   oxymetazoline (AFRIN) 0.05 % nasal spray Place 1 spray into both nostrils daily as needed for congestion.   03/22/2023   potassium chloride SA (KLOR-CON M) 20 MEQ tablet TAKE 2 TABLETS(40 MEQ) BY MOUTH DAILY 180 tablet 2 03/22/2023   rosuvastatin (CRESTOR) 20 MG tablet TAKE 1 TABLET BY MOUTH EVERY DAY  90 tablet 3 03/22/2023   sacubitril-valsartan (ENTRESTO) 97-103 MG TAKE 1 TABLET BY MOUTH TWICE A DAY 180 tablet 3 03/22/2023   traZODone (DESYREL) 50 MG tablet Take 1 tablet (50 mg total) by mouth at bedtime as needed. 90 tablet 3 03/22/2023   tretinoin (RETIN-A) 0.05 % cream Apply 1 application topically at bedtime. Applied to face   03/22/2023   warfarin (COUMADIN) 4 MG tablet TAKE 1 TABLET BY MOUTH EVERY DAY AS DIRECTED BY COUMADIN CLINIC (Patient taking differently: Take 4 mg by mouth daily at 4 PM. TAKE 1 TABLET BY MOUTH EVERY DAY AS DIRECTED BY COUMADIN CLINIC) 90 tablet 0 03/22/2023 at  9:30 PM    Assessment: 77 yo F presents to ED for SOB. Pt has a history for chronic diastolic HF, persistent Afib (on warfarin), mechanical AVR, OSA (CPAP at night), and HTN. Pharmacy consulted to dose warfarin for Afib w/ mechanical AVR.   Home warfarin regimen: 4mg  po daily  Last warfarin dose prior to admission was 12/13 @ 21:30  INR is therapeutic at 2.1 today. Hgb down slightly to 11.4, PLT stable at 221. Of note, patient has been initiated on azithromycin and ceftriaxone for CAP treatment -- will monitor for increased INR while receiving this agents.   Goal of Therapy:  INR: 2-2.5 per anticoag clinic (due to history of ICH in 2006 with INR ~2.4) Monitor platelets by anticoagulation protocol: Yes   Plan:  Give warfarin 4 mg po x1 tonight Monitor daily CBC, INR, and for s/sx of bleeding Monitor for new drug-drug interactions  Continue to monitor oral intake  Lennie Muckle, PharmD PGY1 Pharmacy Resident 03/24/2023 9:53 AM

## 2023-03-24 NOTE — Progress Notes (Signed)
PROGRESS NOTE [name]  YQM:[id number] DOB: April 09, 1946 DOA: 03/23/2023 PCP: Sabino Dick, DO  Brief Narrative/Hospital Course: 77 y.o. female with medical history significant for chronic diastolic heart failure, persistent atrial fibrillation chronically anticoagulated on warfarin, mechanical aortic valve replacement, obstructive sleep apnea on nocturnal CPAP, essential pretension presented to the ED with shortness of breath, productive cough subjective fever onset 7 days 10 days PTA. Seen at the local urgent care 1 to 2 days into the symptoms likely had respiratory tract infection apparently viral panel was negative discharged with Augmentin. In the ED afebrile heart rate 90s to 100 vitals otherwise stable with tachypnea 19-30, 80% on room air. Chest x-ray left lower lobe pneumonia possible left pleural effusion, EKG A-fib with PVC, labs with mild hypokalemia BNP 205 lactic acid 1.2 WBC count 9.9 INR 1.8 COVID RSV PCR influenza negative. Patient was admitted for pneumonia.   Subjective: Seen this am sister at bedside Overnight patient has been afebrile on 3 L nasal cannula BP 90s to 100 Labs with Pro-Cal 0.12  Assessment and Plan: Principal Problem:   CAP (community acquired pneumonia) Active Problems:   Essential hypertension   Hypokalemia   Chronic diastolic CHF (congestive heart failure) (HCC)   H/O mechanical aortic valve replacement   OSA on CPAP   Subtherapeutic anticoagulation   HLD (hyperlipidemia)   Acute hypoxic respiratory failure (HCC)   Acquired hypothyroidism   Persistent atrial fibrillation (HCC)   Subtherapeutic international normalized ratio (INR)   Depression   LLL CAP Upper respiratory illness x 7 to 10 days Acute hypoxic respiratory failure due to pneumonia: Workup shows normal WBC count afebrile lactic acid normal Pro-Cal slightly of 0.12, finding concerning for pneumonia.  Needing supplemental oxygen at 2 to 3 L. Strep pneumonia antigen  negative.blood culture  pending. Continue ceftriaxone/Azithro, supplemental oxygen, Pulmonary toileting-nebs/IS/increase activity.   She is wheezing wondering if she has copd at [facility] or reactive airway disease- has had second smoking for many years, will need PFT as OP, add iv steroid, udesonide, Brovana and Yupelri nebulizer  Hypokalemia-replaced  Persistent A-fib on Coumadin History of mechanical aortic valve replacement Subtherapeutic INR: Pharmacy managing Coumadin continue same monitor INR heart rate controlled continue Coreg 3.125, digoxin Recent Labs  Lab 03/23/23 1728 03/24/23 0807  INR 1.8* 2.1*    Chronic diastolic BMW:[id number]. Continue Entresto, digoxin, Lasix and Coreg Cont to monitor daily I/O,weight, electrolytes and net balance as below.Keep on salt/fluid restricted diet and monitor in tele. Net IO Since Admission: 471.03 mL [03/24/23 1052]  Filed Weights   03/23/23 1718 03/24/23 2725  Weight: 85.7 kg 83.8 kg    Recent Labs  Lab 03/23/23 1728 03/23/23 1744 03/24/23 0807  BNP  --  205.5*  --   BUN 28*  --  24*  CREATININE 1.15*  --  1.12*  K 3.3*  --  3.6  MG 2.0  --  2.1    HLD continue statin Hypothyroidism: Continue Synthroid OSA-CPAP bedtime w/ 02 Depression : Continue Abilify, BuSpar   Class I Obesity: Patient's Body mass index is 31.22 kg/m. : Will benefit with PCP follow-up, weight loss  healthy lifestyle and outpatient sleep evaluation.  DVT prophylaxis: SCDs Start: 03/23/23 2020 Code Status:   Code Status: Full Code Family Communication: plan of care discussed with patient/sister at bedside. Patient status is: Remains hospitalized because of severity of illness Level of care: Telemetry Medical   Dispo: The patient is from: Home  with husband and independent.  Anticipated disposition: TBD Objective: Vitals last 24 hrs: Vitals:   03/23/23 2137 03/24/23 0441 03/24/23 0633 03/24/23 0748  BP: 109/61 (!) 100/50  96/64  Pulse: 87  92  86  Resp: 17 17  16   Temp: 98.7 F (37.1 C) 97.8 F (36.6 C)  98.3 F (36.8 C)  TempSrc: Oral Oral  Oral  SpO2: 99% 98%  98%  Weight:   83.8 kg   Height:       Weight change:   Physical Examination: General exam: alert awake,at baseline, older than stated age HEENT:Oral mucosa moist, Ear/Nose WNL grossly Respiratory system: Bilaterally expiratory wheezing,no use of accessory muscle Cardiovascular system: S1 & S2 +, No JVD. Gastrointestinal system: Abdomen soft,NT,ND, BS+ Nervous System: Alert, awake, moving all extremities,and following commands. Extremities: LE edema neg,distal peripheral pulses palpable and warm.  Skin: No rashes,no icterus. MSK: Normal muscle bulk,tone, power   Medications reviewed: Scheduled Meds:  arformoterol  15 mcg Nebulization BID   ARIPiprazole  2 mg Oral BID   budesonide (PULMICORT) nebulizer solution  0.25 mg Nebulization BID   buPROPion  150 mg Oral Daily   carvedilol  3.125 mg Oral BID WC   digoxin  0.125 mg Oral Daily   furosemide  80 mg Oral Daily   guaiFENesin  600 mg Oral BID   levothyroxine  100 mcg Oral QAC breakfast   methylPREDNISolone (SOLU-MEDROL) injection  40 mg Intravenous Q12H   potassium chloride SA  40 mEq Oral Daily   revefenacin  175 mcg Nebulization Daily   rosuvastatin  20 mg Oral QHS   sacubitril-valsartan  1 tablet Oral BID   warfarin  4 mg Oral ONCE-1600   Warfarin - Pharmacist Dosing Inpatient   Does not apply q1600  Continuous Infusions:  azithromycin     cefTRIAXone (ROCEPHIN)  IV      Diet Order             Diet regular Room service appropriate? Yes; Fluid consistency: Thin  Diet effective now                  Intake/Output Summary (Last 24 hours) at 03/24/2023 1052 Last data filed at 03/23/2023 2326 Gross per 24 hour  Intake 471.03 ml  Output --  Net 471.03 ml   Net IO Since Admission: 471.03 mL [03/24/23 1052]  Wt Readings from Last 3 Encounters:  03/24/23 83.8 kg  02/04/23 85.7 kg   06/29/22 90.4 kg     Unresulted Labs (From admission, onward)     Start     Ordered   03/24/23 0500  Protime-INR  Daily,   R      03/23/23 2210   03/24/23 0500  CBC  Daily,   R      03/23/23 2210          Data Reviewed: I have personally reviewed following labs and imaging studies CBC: Recent Labs  Lab 03/23/23 1728 03/24/23 0807  WBC 9.9 9.6  NEUTROABS 7.1 6.5  HGB 12.4 11.4*  HCT 36.3 34.3*  MCV 88.5 90.0  PLT 225 221   Basic Metabolic Panel:  Recent Labs  Lab 03/23/23 1728 03/24/23 0807  NA 136 139  K 3.3* 3.6  CL 96* 103  CO2 26 27  GLUCOSE 115* 111*  BUN 28* 24*  CREATININE 1.15* 1.12*  CALCIUM 8.8* 8.6*  MG 2.0 2.1  PHOS  --  3.5   GFR: Estimated Creatinine Clearance: 44.6 mL/min (A) (by C-G formula based on SCr of  1.12 mg/dL (H)). Liver Function Tests:  Recent Labs  Lab 03/23/23 1728 03/24/23 0807  AST 38 46*  ALT 28 36  ALKPHOS 69 72  BILITOT 0.9 0.8  PROT 6.9 6.1*  ALBUMIN 3.4* 2.9*   Recent Results (from the past 240 hours)  Culture, blood (Routine x 2)     Status: None (Preliminary result)   Collection Time: 03/23/23  5:20 PM   Specimen: BLOOD RIGHT HAND  Result Value Ref Range Status   Specimen Description BLOOD RIGHT HAND  Final   Special Requests   Final    BOTTLES DRAWN AEROBIC AND ANAEROBIC Blood Culture results may not be optimal due to an inadequate volume of blood received in culture bottles   Culture   Final    NO GROWTH < 24 HOURS Performed at [facility], [address] [place], [place] [address]    Report Status PENDING  Incomplete  Resp panel by RT-PCR (RSV, Flu A&B, Covid) Anterior Nasal Swab     Status: None   Collection Time: 03/23/23  6:17 PM   Specimen: Anterior Nasal Swab  Result Value Ref Range Status   SARS Coronavirus 2 by RT PCR NEGATIVE NEGATIVE Final   Influenza A by PCR NEGATIVE NEGATIVE Final   Influenza B by PCR NEGATIVE NEGATIVE Final    Comment: (NOTE) The Xpert Xpress SARS-CoV-2/FLU/RSV  plus assay is intended as an aid in the diagnosis of influenza from Nasopharyngeal swab specimens and should not be used as a sole basis for treatment. Nasal washings and aspirates are unacceptable for Xpert Xpress SARS-CoV-2/FLU/RSV testing.  Fact Sheet for Patients: BloggerCourse.com  Fact Sheet for Healthcare Providers: SeriousBroker.it  This test is not yet approved or cleared by the [place] FDA and has been authorized for detection and/or diagnosis of SARS-CoV-2 by FDA under an Emergency Use Authorization (EUA). This EUA will remain in effect (meaning this test can be used) for the duration of the COVID-19 declaration under Section 564(b)(1) of the Act, 21 U.S.C. section 360bbb-3(b)(1), unless the authorization is terminated or revoked.     Resp Syncytial Virus by PCR NEGATIVE NEGATIVE Final    Comment: (NOTE) Fact Sheet for Patients: BloggerCourse.com  Fact Sheet for Healthcare Providers: SeriousBroker.it  This test is not yet approved or cleared by the [place] FDA and has been authorized for detection and/or diagnosis of SARS-CoV-2 by FDA under an Emergency Use Authorization (EUA). This EUA will remain in effect (meaning this test can be used) for the duration of the COVID-19 declaration under Section 564(b)(1) of the Act, 21 U.S.C. section 360bbb-3(b)(1), unless the authorization is terminated or revoked.  Performed at [facility], [address], [place], [place] [address]   Culture, blood (Routine x 2)     Status: None (Preliminary result)   Collection Time: 03/23/23  6:20 PM   Specimen: BLOOD  Result Value Ref Range Status   Specimen Description BLOOD RIGHT ANTECUBITAL  Final   Special Requests   Final    BOTTLES DRAWN AEROBIC AND ANAEROBIC Blood Culture results may not be optimal due to an inadequate volume of blood received in culture bottles    Culture   Final    NO GROWTH < 24 HOURS Performed at [facility], [address] [place], [place] [address]    Report Status PENDING  Incomplete  Antimicrobials/Microbiology: Anti-infectives (From admission, onward)    Start     Dose/Rate Route Frequency Ordered Stop   03/24/23 1500  azithromycin (ZITHROMAX) 500 mg in sodium chloride 0.9 % 250 mL IVPB        500 mg 250 mL/hr over 60 Minutes Intravenous Every 24 hours 03/23/23 2022     03/24/23 1500  cefTRIAXone (ROCEPHIN) 1 g in sodium chloride 0.9 % 100 mL IVPB        1 g 200 mL/hr over 30 Minutes Intravenous Every 24 hours 03/23/23 2022     03/23/23 1900  cefTRIAXone (ROCEPHIN) 1 g in sodium chloride 0.9 % 100 mL IVPB        1 g 200 mL/hr over 30 Minutes Intravenous  Once 03/23/23 1856 03/23/23 1947   03/23/23 1900  azithromycin (ZITHROMAX) 500 mg in sodium chloride 0.9 % 250 mL IVPB        500 mg 250 mL/hr over 60 Minutes Intravenous  Once 03/23/23 1856 03/23/23 2049         Component Value Date/Time   SDES BLOOD RIGHT ANTECUBITAL 03/23/2023 1820   SPECREQUEST  03/23/2023 1820    BOTTLES DRAWN AEROBIC AND ANAEROBIC Blood Culture results may not be optimal due to an inadequate volume of blood received in culture bottles   CULT  03/23/2023 1820    NO GROWTH < 24 HOURS Performed at [facility], [address], [place], [place] [address]    REPTSTATUS PENDING 03/23/2023 1820     Radiology Studies: DG Chest 2 View Result Date: 03/23/2023 CLINICAL DATA:  SOB, cough EXAM: CHEST - 2 VIEW COMPARISON:  03/23/2023. FINDINGS: Enlarged cardiac silhouette. Prosthetic aortic valve. Aorta is calcified. Left-sided basilar opacity consistent with consolidation and possible small left-sided pleural effusion. Pneumothorax. Median sternotomy wires. IMPRESSION: Left-sided basilar consolidation and possible small effusion. Prominent cardiac silhouette. Electronically Signed   By: Layla Maw M.D.   On: 03/23/2023 18:42      LOS: 1 day   Total time spent in review of labs and imaging, patient evaluation, formulation of plan, documentation and communication with family: 50 minutes  Lanae Boast, MD [facility]  03/24/2023, 10:52 AM

## 2023-03-24 NOTE — Progress Notes (Signed)
Maintenance nebulizer's given to Pt. PRN SABA given due to increase WOB. Rhonchi/Expiratory wheezing heard bilaterally. Flutter valve given to help with congestion. Educated and showed how to use and encouraged family to remind Pt to use flutter.

## 2023-03-24 NOTE — Progress Notes (Signed)
   03/24/23 2108  BiPAP/CPAP/SIPAP  Reason BIPAP/CPAP not in use Non-compliant   Pt refused CPAP for nighttime use.

## 2023-03-24 NOTE — Plan of Care (Signed)
  Problem: Education: Goal: Knowledge of General Education information will improve Description: Including pain rating scale, medication(s)/side effects and non-pharmacologic comfort measures Outcome: Progressing   Problem: Clinical Measurements: Goal: Ability to maintain clinical measurements within normal limits will improve Outcome: Progressing   Problem: Activity: Goal: Risk for activity intolerance will decrease Outcome: Progressing   Problem: Elimination: Goal: Will not experience complications related to bowel motility Outcome: Progressing   Problem: Pain Management: Goal: General experience of comfort will improve Outcome: Progressing   Problem: Pain Management: Goal: General experience of comfort will improve Outcome: Progressing   Problem: Safety: Goal: Ability to remain free from injury will improve Outcome: Progressing   Problem: Skin Integrity: Goal: Risk for impaired skin integrity will decrease Outcome: Progressing

## 2023-03-25 DIAGNOSIS — J189 Pneumonia, unspecified organism: Secondary | ICD-10-CM | POA: Diagnosis not present

## 2023-03-25 LAB — CBC
HCT: 34.3 % — ABNORMAL LOW (ref 36.0–46.0)
Hemoglobin: 11.3 g/dL — ABNORMAL LOW (ref 12.0–15.0)
MCH: 29.9 pg (ref 26.0–34.0)
MCHC: 32.9 g/dL (ref 30.0–36.0)
MCV: 90.7 fL (ref 80.0–100.0)
Platelets: 249 10*3/uL (ref 150–400)
RBC: 3.78 MIL/uL — ABNORMAL LOW (ref 3.87–5.11)
RDW: 12.9 % (ref 11.5–15.5)
WBC: 5.7 10*3/uL (ref 4.0–10.5)
nRBC: 0 % (ref 0.0–0.2)

## 2023-03-25 LAB — PROTIME-INR
INR: 2.9 — ABNORMAL HIGH (ref 0.8–1.2)
Prothrombin Time: 30.4 s — ABNORMAL HIGH (ref 11.4–15.2)

## 2023-03-25 MED ORDER — SODIUM CHLORIDE 0.9 % IV SOLN
2.0000 g | INTRAVENOUS | Status: DC
  Administered 2023-03-25 – 2023-03-26 (×2): 2 g via INTRAVENOUS
  Filled 2023-03-25 (×2): qty 20

## 2023-03-25 MED ORDER — OXYMETAZOLINE HCL 0.05 % NA SOLN
1.0000 | Freq: Two times a day (BID) | NASAL | Status: DC
  Administered 2023-03-25 – 2023-03-27 (×4): 1 via NASAL
  Filled 2023-03-25: qty 30

## 2023-03-25 NOTE — Plan of Care (Signed)

## 2023-03-25 NOTE — Progress Notes (Signed)
PHARMACY - ANTICOAGULATION CONSULT NOTE  Pharmacy Consult for Warfarin Indication: atrial fibrillation and mechanical AVR  Allergies  Allergen Reactions   Codeine Anaphylaxis    Jerking, involuntary jerking    Dilantin [Phenytoin Sodium Extended] Rash and Itching   Metoprolol Other (See Comments)    depression    Propoxyphene Other (See Comments)    Bad vivid dreams    Patient Measurements: Height: 5' 4.5" (163.8 cm) Weight: 89.4 kg (197 lb 1.5 oz) IBW/kg (Calculated) : 55.85 Heparin Dosing Weight: 74.6 kg  Vital Signs: Temp: 99.2 F (37.3 C) (12/16 0449) Temp Source: Oral (12/16 0449) BP: 108/59 (12/16 0843) Pulse Rate: 65 (12/16 0843)  Labs: Recent Labs    03/23/23 1728 03/24/23 0807 03/25/23 0625  HGB 12.4 11.4* 11.3*  HCT 36.3 34.3* 34.3*  PLT 225 221 249  LABPROT 21.2* 23.6* 30.4*  INR 1.8* 2.1* 2.9*  CREATININE 1.15* 1.12*  --     Estimated Creatinine Clearance: 46 mL/min (A) (by C-G formula based on SCr of 1.12 mg/dL (H)).   Medical History: Past Medical History:  Diagnosis Date   Anxiety    Aortic stenosis    status post aortic valve replacement with 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   Depression    DJD (degenerative joint disease) of knee    Dyslipidemia    GERD (gastroesophageal reflux disease)    "several years ago; none since" (11/12/2012)   Hyperlipidemia    Hyperlipidemia LDL goal <70 01/19/2017   Hypertension    Left atrial enlargement    Mitral regurgitation 06/25/2016   Mild moderate MR by echo 06/2016   Obesity    Persistent atrial fibrillation (HCC)    s/p afib ablation x 2 at [facility]   Sleep apnea    On CPAP at 12cm H2O   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 - now cleared by neurosurgery to maintain  INR 2-2.5    Medications:  Medications Prior to Admission  Medication Sig Dispense Refill Last Dose/Taking   acetaminophen (TYLENOL) 500 MG tablet Take 500 mg by mouth as needed.   Past Month   amoxicillin (AMOXIL) 875 MG tablet Take 875 mg by mouth 2 (two) times daily. 10 days   03/22/2023   ARIPiprazole (ABILIFY) 2 MG tablet Take 1 tablet (2 mg total) by mouth 2 (two) times daily. 180 tablet 3 03/22/2023   buPROPion (WELLBUTRIN XL) 150 MG 24 hr tablet Take 1 tablet (150 mg total) by mouth daily. 90 tablet 3 03/22/2023   calcium-vitamin D (OSCAL WITH D) 500-200 MG-UNIT per tablet Take 2 tablets by mouth in the morning.   03/22/2023   carvedilol (COREG) 3.125 MG tablet Take 1 tablet (3.125 mg total) by mouth 2 (two) times daily with a meal. 180 tablet 1 03/22/2023   Cholecalciferol (VITAMIN D) 2000 UNITS tablet Take 4,000 Units by mouth in the morning.   03/22/2023   desvenlafaxine (PRISTIQ) 100 MG 24 hr tablet Take 1 tablet (100 mg total) by mouth daily. 90 tablet 3 03/22/2023   digoxin (LANOXIN) 0.125 MG tablet Take 1 tablet (0.125 mg total) by mouth daily. 90 tablet 1 03/22/2023   famotidine (PEPCID) 10 MG tablet Take 10 mg by mouth as needed for heartburn or  indigestion.   03/22/2023   ferrous sulfate 325 (65 FE) MG tablet Take 325 mg by mouth in the morning.   03/22/2023   furosemide (LASIX) 80 MG tablet Take 1 tablet by mouth daily. OKAY TO TAKE EXTRA FOR WEIGHT GAIN> 3LB/ DAILY OR 5LB/WEEKLY 180 tablet 3 03/22/2023   levothyroxine (SYNTHROID, LEVOTHROID) 100 MCG tablet Take 100 mcg daily before breakfast by mouth.   03/22/2023   Multiple Vitamin (MULTIVITAMIN WITH MINERALS) TABS Take 1 tablet by mouth in the morning.   03/22/2023   oxymetazoline (AFRIN) 0.05 % nasal spray Place 1 spray into both nostrils daily as needed for congestion.   03/22/2023   potassium chloride SA (KLOR-CON M) 20 MEQ tablet TAKE 2 TABLETS(40 MEQ) BY MOUTH DAILY 180 tablet 2 03/22/2023   rosuvastatin (CRESTOR) 20  MG tablet TAKE 1 TABLET BY MOUTH EVERY DAY 90 tablet 3 03/22/2023   sacubitril-valsartan (ENTRESTO) 97-103 MG TAKE 1 TABLET BY MOUTH TWICE A DAY 180 tablet 3 03/22/2023   traZODone (DESYREL) 50 MG tablet Take 1 tablet (50 mg total) by mouth at bedtime as needed. 90 tablet 3 03/22/2023   tretinoin (RETIN-A) 0.05 % cream Apply 1 application topically at bedtime. Applied to face   03/22/2023   warfarin (COUMADIN) 4 MG tablet TAKE 1 TABLET BY MOUTH EVERY DAY AS DIRECTED BY COUMADIN CLINIC (Patient taking differently: Take 4 mg by mouth daily at 4 PM. TAKE 1 TABLET BY MOUTH EVERY DAY AS DIRECTED BY COUMADIN CLINIC) 90 tablet 0 03/22/2023 at  9:30 PM    Assessment: 77 yo F presents to ED for SOB. Pt has a history for chronic diastolic HF, persistent Afib (on warfarin), mechanical AVR, OSA (CPAP at night), and HTN. Pharmacy consulted to dose warfarin for Afib w/ mechanical AVR.   Home warfarin regimen: 4mg  po daily  Last warfarin dose prior to admission was 12/13 @ 21:30  INR up to 2.9 today. Very likely due to interaction with abx.   Goal of Therapy:  INR: 2-2.5 per anticoag clinic (due to history of ICH in 2006 with INR ~2.4) Monitor platelets by anticoagulation protocol: Yes   Plan:  No coumadin today Monitor daily CBC, INR, and for s/sx of bleeding Monitor for new drug-drug interactions   Ulyses Southward, PharmD, BCIDP, AAHIVP, CPP Infectious Disease Pharmacist 03/25/2023 10:19 AM

## 2023-03-25 NOTE — Progress Notes (Signed)
Mobility Specialist Progress Note:    03/25/23 1442  Mobility  Activity Ambulated with assistance in room;Ambulated with assistance in hallway  Level of Assistance Standby assist, set-up cues, supervision of patient - no hands on  Assistive Device None  Distance Ambulated (ft) 350 ft  Activity Response Tolerated well  Mobility Referral Yes  Mobility visit 1 Mobility  Mobility Specialist Start Time (ACUTE ONLY) 1430  Mobility Specialist Stop Time (ACUTE ONLY) 1442  Mobility Specialist Time Calculation (min) (ACUTE ONLY) 12 min   Pt received leaving bathroom, agreeable to ambulate in hallways. No AD required, CGA for safety d/t slight swaying. Tolerated well, SpO2 91-94% on RA throughout session. Returned pt to room, sitting up in chair, sister at bedside, all needs met.    Feliciana Rossetti Mobility Specialist Please contact via Special educational needs teacher or  Rehab office at [phone]

## 2023-03-25 NOTE — Progress Notes (Signed)
PROGRESS NOTE [name]  ONG:[id number] DOB: 16-Apr-1945 DOA: 03/23/2023 PCP: Sabino Dick, DO  Brief Narrative/Hospital Course: 77 y.o. female with medical history significant for chronic diastolic heart failure, persistent atrial fibrillation chronically anticoagulated on warfarin, mechanical aortic valve replacement, obstructive sleep apnea on nocturnal CPAP, essential pretension presented to the ED with shortness of breath, productive cough subjective fever onset 7 days 10 days PTA. Seen at the local urgent care 1 to 2 days into the symptoms likely had respiratory tract infection apparently viral panel was negative discharged with Augmentin. In the ED afebrile heart rate 90s to 100 vitals otherwise stable with tachypnea 19-30, 80% on room air. Chest x-ray left lower lobe pneumonia possible left pleural effusion, EKG A-fib with PVC, labs with mild hypokalemia BNP 205 lactic acid 1.2 WBC count 9.9 INR 1.8 COVID RSV PCR influenza negative. Patient was admitted for pneumonia.  Subjective: Patient seen and examined He is alert awake resting comfortably reports her vision is much better still having wheezing On 3 to nasal cannula currently  Assessment and Plan: Principal Problem:   CAP (community acquired pneumonia) Active Problems:   Essential hypertension   Hypokalemia   Chronic diastolic CHF (congestive heart failure) (HCC)   H/O mechanical aortic valve replacement   OSA on CPAP   Subtherapeutic anticoagulation   HLD (hyperlipidemia)   Acute hypoxic respiratory failure (HCC)   Acquired hypothyroidism   Persistent atrial fibrillation (HCC)   Subtherapeutic international normalized ratio (INR)   Depression   LLL CAP Upper respiratory illness x 7 to 10 days Acute hypoxic respiratory failure due to pneumonia: Overall clinically improving still having wheezing but better.  Afebrile, pneumonia antigen negative.blood culture  ngtd. . Continue ceftriaxone/Azithro,  supplemental oxygen, nebs/IS/increase activity. Continue systemic steroids, Pulmicort,Brovana and Yupelri nebulizer ? if she has copd at baseline from second hand smoking or reactive airway disease. She will need PFT as OP, add iv steroid,   Hypokalemia-resolved  Persistent A-fib on Coumadin History of mechanical aortic valve replacement Subtherapeutic INR: Pharmacy managing Coumadin-INR therapeutic Continue Coreg 3.125, digoxin Recent Labs  Lab 03/23/23 1728 03/24/23 0807 03/25/23 0625  INR 1.8* 2.1* 2.9*    Chronic diastolic CHF: Remain euvolemic. Continue Entresto, digoxin, Lasix and Coreg Cont to monitor daily I/O,weight, electrolytes Net IO Since Admission: 471.03 mL [03/25/23 1044]  Filed Weights   03/23/23 1718 03/24/23 0633 03/25/23 0421  Weight: 85.7 kg 83.8 kg 89.4 kg    HLD continue statin Hypothyroidism: Continue Synthroid OSA-CPAP bedtime w/ 02.  Has not been using CPAP for few days due to respiratory issues Depression : Continue Abilify, BuSpar   Class I Obesity: Patient's Body mass index is 33.31 kg/m. : Will benefit with PCP follow-up, weight loss  healthy lifestyle and outpatient sleep evaluation.  DVT prophylaxis: SCDs Start: 03/23/23 2020 Code Status:   Code Status: Full Code Family Communication: plan of care discussed with patient/sister at bedside. Patient status is: Remains hospitalized because of severity of illness Level of care: Telemetry Medical   Dispo: The patient is from: Home  with husband and independent.            Anticipated disposition: Anticipate discharge home in 1 to 2 days  Objective: Vitals last 24 hrs: Vitals:   03/25/23 0124 03/25/23 0421 03/25/23 0449 03/25/23 0843  BP: (!) 92/52  99/66 (!) 108/59  Pulse: 75  84 65  Resp: 18  18   Temp: 98.6 F (37 C)  99.2 F (37.3 C)   TempSrc: Oral  Oral   SpO2: 98%  97%   Weight:  89.4 kg    Height:       Weight change: 3.67 kg  Physical Examination: General exam: alert awake,  oriented at baseline, older than stated age HEENT:Oral mucosa moist, Ear/Nose WNL grossly Respiratory system: Bilaterally expiratory wheezing,no use of accessory muscle Cardiovascular system: S1 & S2 +, No JVD. Gastrointestinal system: Abdomen soft,NT,ND, BS+ Nervous System: Alert, awake, moving all extremities,and following commands. Extremities: LE edema neg,distal peripheral pulses palpable and warm.  Skin: No rashes,no icterus. MSK: Normal muscle bulk,tone, power   Medications reviewed: Scheduled Meds:  arformoterol  15 mcg Nebulization BID   ARIPiprazole  2 mg Oral BID   budesonide (PULMICORT) nebulizer solution  0.25 mg Nebulization BID   buPROPion  150 mg Oral Daily   carvedilol  3.125 mg Oral BID WC   digoxin  0.125 mg Oral Daily   furosemide  80 mg Oral Daily   guaiFENesin  600 mg Oral BID   levothyroxine  100 mcg Oral QAC breakfast   methylPREDNISolone (SOLU-MEDROL) injection  40 mg Intravenous Q12H   potassium chloride SA  40 mEq Oral Daily   revefenacin  175 mcg Nebulization Daily   rosuvastatin  20 mg Oral QHS   sacubitril-valsartan  1 tablet Oral BID   Warfarin - Pharmacist Dosing Inpatient   Does not apply q1600  Continuous Infusions:  azithromycin 500 mg (03/24/23 1744)   cefTRIAXone (ROCEPHIN)  IV      Diet Order             Diet regular Room service appropriate? Yes; Fluid consistency: Thin  Diet effective now                 No intake or output data in the 24 hours ending 03/25/23 1044  Net IO Since Admission: 471.03 mL [03/25/23 1044]  Wt Readings from Last 3 Encounters:  03/25/23 89.4 kg  02/04/23 85.7 kg  06/29/22 90.4 kg     Unresulted Labs (From admission, onward)     Start     Ordered   03/24/23 0500  Protime-INR  Daily,   R      03/23/23 2210   03/24/23 0500  CBC  Daily,   R      03/23/23 2210          Data Reviewed: I have personally reviewed following labs and imaging studies CBC: Recent Labs  Lab 03/23/23 1728  03/24/23 0807 03/25/23 0625  WBC 9.9 9.6 5.7  NEUTROABS 7.1 6.5  --   HGB 12.4 11.4* 11.3*  HCT 36.3 34.3* 34.3*  MCV 88.5 90.0 90.7  PLT 225 221 249   Basic Metabolic Panel:  Recent Labs  Lab 03/23/23 1728 03/24/23 0807  NA 136 139  K 3.3* 3.6  CL 96* 103  CO2 26 27  GLUCOSE 115* 111*  BUN 28* 24*  CREATININE 1.15* 1.12*  CALCIUM 8.8* 8.6*  MG 2.0 2.1  PHOS  --  3.5   GFR: Estimated Creatinine Clearance: 46 mL/min (A) (by C-G formula based on SCr of 1.12 mg/dL (H)). Liver Function Tests:  Recent Labs  Lab 03/23/23 1728 03/24/23 0807  AST 38 46*  ALT 28 36  ALKPHOS 69 72  BILITOT 0.9 0.8  PROT 6.9 6.1*  ALBUMIN 3.4* 2.9*   Recent Results (from the past 240 hours)  Culture, blood (Routine x 2)     Status: None (Preliminary result)   Collection Time: 03/23/23  5:20  PM   Specimen: BLOOD RIGHT HAND  Result Value Ref Range Status   Specimen Description BLOOD RIGHT HAND  Final   Special Requests   Final    BOTTLES DRAWN AEROBIC AND ANAEROBIC Blood Culture results may not be optimal due to an inadequate volume of blood received in culture bottles   Culture   Final    NO GROWTH 2 DAYS Performed at [facility], [address], [place], [place] [address]    Report Status PENDING  Incomplete  Resp panel by RT-PCR (RSV, Flu A&B, Covid) Anterior Nasal Swab     Status: None   Collection Time: 03/23/23  6:17 PM   Specimen: Anterior Nasal Swab  Result Value Ref Range Status   SARS Coronavirus 2 by RT PCR NEGATIVE NEGATIVE Final   Influenza A by PCR NEGATIVE NEGATIVE Final   Influenza B by PCR NEGATIVE NEGATIVE Final    Comment: (NOTE) The Xpert Xpress SARS-CoV-2/FLU/RSV plus assay is intended as an aid in the diagnosis of influenza from Nasopharyngeal swab specimens and should not be used as a sole basis for treatment. Nasal washings and aspirates are unacceptable for Xpert Xpress SARS-CoV-2/FLU/RSV testing.  Fact Sheet for  Patients: BloggerCourse.com  Fact Sheet for Healthcare Providers: SeriousBroker.it  This test is not yet approved or cleared by the [place] FDA and has been authorized for detection and/or diagnosis of SARS-CoV-2 by FDA under an Emergency Use Authorization (EUA). This EUA will remain in effect (meaning this test can be used) for the duration of the COVID-19 declaration under Section 564(b)(1) of the Act, 21 U.S.C. section 360bbb-3(b)(1), unless the authorization is terminated or revoked.     Resp Syncytial Virus by PCR NEGATIVE NEGATIVE Final    Comment: (NOTE) Fact Sheet for Patients: BloggerCourse.com  Fact Sheet for Healthcare Providers: SeriousBroker.it  This test is not yet approved or cleared by the [place] FDA and has been authorized for detection and/or diagnosis of SARS-CoV-2 by FDA under an Emergency Use Authorization (EUA). This EUA will remain in effect (meaning this test can be used) for the duration of the COVID-19 declaration under Section 564(b)(1) of the Act, 21 U.S.C. section 360bbb-3(b)(1), unless the authorization is terminated or revoked.  Performed at [facility], [address] [place], [place] [address]   Culture, blood (Routine x 2)     Status: None (Preliminary result)   Collection Time: 03/23/23  6:20 PM   Specimen: BLOOD  Result Value Ref Range Status   Specimen Description BLOOD RIGHT ANTECUBITAL  Final   Special Requests   Final    BOTTLES DRAWN AEROBIC AND ANAEROBIC Blood Culture results may not be optimal due to an inadequate volume of blood received in culture bottles   Culture   Final    NO GROWTH 2 DAYS Performed at [facility], [address] [place], [place] [address]    Report Status PENDING  Incomplete  Antimicrobials/Microbiology: Anti-infectives (From admission, onward)    Start     Dose/Rate Route  Frequency Ordered Stop   03/25/23 1500  cefTRIAXone (ROCEPHIN) 2 g in sodium chloride 0.9 % 100 mL IVPB        2 g 200 mL/hr over 30 Minutes Intravenous Every 24 hours 03/25/23 1012     03/24/23 1500  azithromycin (ZITHROMAX) 500 mg in sodium chloride 0.9 % 250 mL IVPB        500 mg 250 mL/hr over 60 Minutes Intravenous Every 24 hours 03/23/23  2022     03/24/23 1500  cefTRIAXone (ROCEPHIN) 1 g in sodium chloride 0.9 % 100 mL IVPB  Status:  Discontinued        1 g 200 mL/hr over 30 Minutes Intravenous Every 24 hours 03/23/23 2022 03/25/23 1012   03/23/23 1900  cefTRIAXone (ROCEPHIN) 1 g in sodium chloride 0.9 % 100 mL IVPB        1 g 200 mL/hr over 30 Minutes Intravenous  Once 03/23/23 1856 03/23/23 1947   03/23/23 1900  azithromycin (ZITHROMAX) 500 mg in sodium chloride 0.9 % 250 mL IVPB        500 mg 250 mL/hr over 60 Minutes Intravenous  Once 03/23/23 1856 03/23/23 2049         Component Value Date/Time   SDES BLOOD RIGHT ANTECUBITAL 03/23/2023 1820   SPECREQUEST  03/23/2023 1820    BOTTLES DRAWN AEROBIC AND ANAEROBIC Blood Culture results may not be optimal due to an inadequate volume of blood received in culture bottles   CULT  03/23/2023 1820    NO GROWTH 2 DAYS Performed at [facility], [address] [place], [place] [address]    REPTSTATUS PENDING 03/23/2023 1820     Radiology Studies: DG Chest 2 View Result Date: 03/23/2023 CLINICAL DATA:  SOB, cough EXAM: CHEST - 2 VIEW COMPARISON:  03/23/2023. FINDINGS: Enlarged cardiac silhouette. Prosthetic aortic valve. Aorta is calcified. Left-sided basilar opacity consistent with consolidation and possible small left-sided pleural effusion. Pneumothorax. Median sternotomy wires. IMPRESSION: Left-sided basilar consolidation and possible small effusion. Prominent cardiac silhouette. Electronically Signed   By: Layla Maw M.D.   On: 03/23/2023 18:42   LOS: 2 days   Total time spent in review of labs and imaging, patient  evaluation, formulation of plan, documentation and communication with family: 35 minutes  Lanae Boast, MD [facility]  03/25/2023, 10:44 AM

## 2023-03-26 DIAGNOSIS — J189 Pneumonia, unspecified organism: Secondary | ICD-10-CM | POA: Diagnosis not present

## 2023-03-26 LAB — CBC
HCT: 33.6 % — ABNORMAL LOW (ref 36.0–46.0)
Hemoglobin: 11.2 g/dL — ABNORMAL LOW (ref 12.0–15.0)
MCH: 30.2 pg (ref 26.0–34.0)
MCHC: 33.3 g/dL (ref 30.0–36.0)
MCV: 90.6 fL (ref 80.0–100.0)
Platelets: 260 10*3/uL (ref 150–400)
RBC: 3.71 MIL/uL — ABNORMAL LOW (ref 3.87–5.11)
RDW: 12.8 % (ref 11.5–15.5)
WBC: 8.6 10*3/uL (ref 4.0–10.5)
nRBC: 0 % (ref 0.0–0.2)

## 2023-03-26 LAB — PROTIME-INR
INR: 3.2 — ABNORMAL HIGH (ref 0.8–1.2)
Prothrombin Time: 33 s — ABNORMAL HIGH (ref 11.4–15.2)

## 2023-03-26 MED ORDER — AZITHROMYCIN 500 MG PO TABS
500.0000 mg | ORAL_TABLET | Freq: Every day | ORAL | Status: AC
  Administered 2023-03-26 – 2023-03-27 (×2): 500 mg via ORAL
  Filled 2023-03-26 (×2): qty 1

## 2023-03-26 MED ORDER — HYDROCOD POLI-CHLORPHE POLI ER 10-8 MG/5ML PO SUER
5.0000 mL | Freq: Two times a day (BID) | ORAL | Status: DC | PRN
  Administered 2023-03-26 – 2023-03-27 (×3): 5 mL via ORAL
  Filled 2023-03-26 (×3): qty 5

## 2023-03-26 MED ORDER — ALPRAZOLAM 0.5 MG PO TABS
0.5000 mg | ORAL_TABLET | Freq: Three times a day (TID) | ORAL | Status: DC | PRN
  Administered 2023-03-26: 0.5 mg via ORAL
  Filled 2023-03-26: qty 1

## 2023-03-26 NOTE — Care Management Important Message (Signed)
Important Message  Patient Details  Name: [name] MRN: [id number] Date of Birth: 03/19/1946   Important Message Given:  Yes - Medicare IM     Dorena Bodo 03/26/2023, 2:58 PM

## 2023-03-26 NOTE — Progress Notes (Addendum)
Nurse requested Mobility Specialist to perform oxygen saturation test with pt which includes removing pt from oxygen both at rest and while ambulating.  Below are the results from that testing.     Patient Saturations on Room Air at Rest = spO2 91%  Patient Saturations on Room Air while Ambulating = sp02 87% .  Rested and performed pursed lip breathing for 1 minute with sp02 at 86%.  Patient Saturations on 0.5 Liters of oxygen while Ambulating = sp02 96%  At end of testing pt left in room on 1  Liters of oxygen.  Reported results to nurse.   Feliciana Rossetti Mobility Specialist Please contact via Special educational needs teacher or  Rehab office at [phone]

## 2023-03-26 NOTE — Progress Notes (Signed)
Transition of Care [facility]) - Inpatient Brief Assessment   Patient Details  Name: [name] MRN: [id number] Date of Birth: 1945-12-31  Transition of Care [facility]) CM/SW Contact:    Janae Bridgeman, RN Phone Number: 03/26/2023, 12:34 PM   Clinical Narrative: CM noted that patient admitted with pneumonia.  Patient is currently on room air.  Patient lives at home with her husband and should return to home when medically stable for discharge.  No TOC needs at this time.   Transition of Care Asessment: Insurance and Status: (P) Insurance coverage has been reviewed Patient has primary care physician: (P) Yes Home environment has been reviewed: (P) From home with spouse Prior level of function:: (P) Independent Prior/Current Home Services: (P) No current home services Social Drivers of Health Review: (P) SDOH reviewed interventions complete Readmission risk has been reviewed: (P) Yes Transition of care needs: (P) no transition of care needs at this time

## 2023-03-26 NOTE — Progress Notes (Addendum)
PHARMACY - ANTICOAGULATION CONSULT NOTE  Pharmacy Consult for Warfarin Indication: atrial fibrillation and mechanical AVR  Allergies  Allergen Reactions   Codeine Anaphylaxis    Jerking, involuntary jerking    Dilantin [Phenytoin Sodium Extended] Rash and Itching   Metoprolol Other (See Comments)    depression    Propoxyphene Other (See Comments)    Bad vivid dreams    Patient Measurements: Height: 5' 4.5" (163.8 cm) Weight: 85.8 kg (189 lb 2.5 oz) IBW/kg (Calculated) : 55.85 Heparin Dosing Weight: 74.6 kg  Vital Signs: Temp: 97.5 F (36.4 C) (12/17 0804) Temp Source: Oral (12/17 0514) BP: 129/79 (12/17 0804) Pulse Rate: 74 (12/17 0804)  Labs: Recent Labs    03/23/23 1728 03/24/23 0807 03/25/23 0625 03/26/23 0623  HGB 12.4 11.4* 11.3* 11.2*  HCT 36.3 34.3* 34.3* 33.6*  PLT 225 221 249 260  LABPROT 21.2* 23.6* 30.4* 33.0*  INR 1.8* 2.1* 2.9* 3.2*  CREATININE 1.15* 1.12*  --   --     Estimated Creatinine Clearance: 45.1 mL/min (A) (by C-G formula based on SCr of 1.12 mg/dL (H)).   Medical History: Past Medical History:  Diagnosis Date   Anxiety    Aortic stenosis    status post aortic valve replacement with 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   Depression    DJD (degenerative joint disease) of knee    Dyslipidemia    GERD (gastroesophageal reflux disease)    "several years ago; none since" (11/12/2012)   Hyperlipidemia    Hyperlipidemia LDL goal <70 01/19/2017   Hypertension    Left atrial enlargement    Mitral regurgitation 06/25/2016   Mild moderate MR by echo 06/2016   Obesity    Persistent atrial fibrillation (HCC)    s/p afib ablation x 2 at [facility]   Sleep apnea    On CPAP at 12cm H2O   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 shortly  after AVR - now cleared by neurosurgery to maintain INR 2-2.5    Medications:  Medications Prior to Admission  Medication Sig Dispense Refill Last Dose/Taking   acetaminophen (TYLENOL) 500 MG tablet Take 500 mg by mouth as needed.   Past Month   amoxicillin (AMOXIL) 875 MG tablet Take 875 mg by mouth 2 (two) times daily. 10 days   03/22/2023   ARIPiprazole (ABILIFY) 2 MG tablet Take 1 tablet (2 mg total) by mouth 2 (two) times daily. 180 tablet 3 03/22/2023   buPROPion (WELLBUTRIN XL) 150 MG 24 hr tablet Take 1 tablet (150 mg total) by mouth daily. 90 tablet 3 03/22/2023   calcium-vitamin D (OSCAL WITH D) 500-200 MG-UNIT per tablet Take 2 tablets by mouth in the morning.   03/22/2023   carvedilol (COREG) 3.125 MG tablet Take 1 tablet (3.125 mg total) by mouth 2 (two) times daily with a meal. 180 tablet 1 03/22/2023   Cholecalciferol (VITAMIN D) 2000 UNITS tablet Take 4,000 Units by mouth in the morning.   03/22/2023   desvenlafaxine (PRISTIQ) 100 MG 24 hr tablet Take 1 tablet (100 mg total) by mouth daily. 90 tablet 3 03/22/2023   digoxin (LANOXIN) 0.125 MG tablet Take 1 tablet (0.125 mg total) by mouth daily. 90 tablet 1 03/22/2023   famotidine (PEPCID) 10 MG tablet  Take 10 mg by mouth as needed for heartburn or indigestion.   03/22/2023   ferrous sulfate 325 (65 FE) MG tablet Take 325 mg by mouth in the morning.   03/22/2023   furosemide (LASIX) 80 MG tablet Take 1 tablet by mouth daily. OKAY TO TAKE EXTRA FOR WEIGHT GAIN> 3LB/ DAILY OR 5LB/WEEKLY 180 tablet 3 03/22/2023   levothyroxine (SYNTHROID, LEVOTHROID) 100 MCG tablet Take 100 mcg daily before breakfast by mouth.   03/22/2023   Multiple Vitamin (MULTIVITAMIN WITH MINERALS) TABS Take 1 tablet by mouth in the morning.   03/22/2023   oxymetazoline (AFRIN) 0.05 % nasal spray Place 1 spray into both nostrils daily as needed for congestion.   03/22/2023   potassium chloride SA (KLOR-CON M) 20 MEQ tablet TAKE 2 TABLETS(40 MEQ) BY MOUTH DAILY  180 tablet 2 03/22/2023   rosuvastatin (CRESTOR) 20 MG tablet TAKE 1 TABLET BY MOUTH EVERY DAY 90 tablet 3 03/22/2023   sacubitril-valsartan (ENTRESTO) 97-103 MG TAKE 1 TABLET BY MOUTH TWICE A DAY 180 tablet 3 03/22/2023   traZODone (DESYREL) 50 MG tablet Take 1 tablet (50 mg total) by mouth at bedtime as needed. 90 tablet 3 03/22/2023   tretinoin (RETIN-A) 0.05 % cream Apply 1 application topically at bedtime. Applied to face   03/22/2023   warfarin (COUMADIN) 4 MG tablet TAKE 1 TABLET BY MOUTH EVERY DAY AS DIRECTED BY COUMADIN CLINIC (Patient taking differently: Take 4 mg by mouth daily at 4 PM. TAKE 1 TABLET BY MOUTH EVERY DAY AS DIRECTED BY COUMADIN CLINIC) 90 tablet 0 03/22/2023 at  9:30 PM    Assessment: 77 yo F presents to ED for SOB. Pt has a history for chronic diastolic HF, persistent Afib (on warfarin), mechanical AVR, OSA (CPAP at night), and HTN. Pharmacy consulted to dose warfarin for Afib w/ mechanical AVR.   Home warfarin regimen: 4mg  po daily  Last warfarin dose prior to admission was 12/13 @ 21:30  INR up to 3.2 today after holding coumadin yesterday. Very likely due to interaction with abx.   Addendum  Ok to stop abx at 5d per Dr. Jonathon Bellows.  Goal of Therapy:  INR: 2-2.5 per anticoag clinic (due to history of ICH in 2006 with INR ~2.4) Monitor platelets by anticoagulation protocol: Yes   Plan:  No coumadin today Monitor daily CBC, INR, and for s/sx of bleeding Monitor for new drug-drug interactions   Ulyses Southward, PharmD, BCIDP, AAHIVP, CPP Infectious Disease Pharmacist 03/26/2023 9:20 AM

## 2023-03-26 NOTE — Plan of Care (Signed)
  Problem: Education: Goal: Knowledge of General Education information will improve Description: Including pain rating scale, medication(s)/side effects and non-pharmacologic comfort measures Outcome: Progressing   Problem: Activity: Goal: Risk for activity intolerance will decrease Outcome: Progressing   Problem: Nutrition: Goal: Adequate nutrition will be maintained Outcome: Progressing   Problem: Coping: Goal: Level of anxiety will decrease Outcome: Progressing   Problem: Elimination: Goal: Will not experience complications related to bowel motility Outcome: Progressing Goal: Will not experience complications related to urinary retention Outcome: Progressing   Problem: Pain Management: Goal: General experience of comfort will improve Outcome: Progressing   Problem: Safety: Goal: Ability to remain free from injury will improve Outcome: Progressing   Problem: Skin Integrity: Goal: Risk for impaired skin integrity will decrease Outcome: Progressing

## 2023-03-26 NOTE — Progress Notes (Addendum)
Mobility Specialist Progress Note:    03/26/23 0928  Mobility  Activity Ambulated with assistance in hallway  Level of Assistance Contact guard assist, steadying assist  Assistive Device None  Distance Ambulated (ft) 350 ft  Activity Response Tolerated well  Mobility Referral Yes  Mobility visit 1 Mobility  Mobility Specialist Start Time (ACUTE ONLY) 0920  Mobility Specialist Stop Time (ACUTE ONLY) [id number]  Mobility Specialist Time Calculation (min) (ACUTE ONLY) 8 min   Pt received dangle EOB, agreeable to mobility session. Ambulated in hallway w/o AD, CGA for safety. Tolerated well, increased O2 to 0.5L d/t desat, see O2 sat note. Returned pt to room, sitting comfortably in chair with RN and sister in room.    Feliciana Rossetti Mobility Specialist Please contact via Special educational needs teacher or  Rehab office at [phone]

## 2023-03-26 NOTE — Progress Notes (Signed)
PROGRESS NOTE [name]  ZOX:[id number] DOB: 1946/03/12 DOA: 03/23/2023 PCP: Sabino Dick, DO  Brief Narrative/Hospital Course: 77 y.o. female with medical history significant for chronic diastolic heart failure, persistent atrial fibrillation chronically anticoagulated on warfarin, mechanical aortic valve replacement, obstructive sleep apnea on nocturnal CPAP, essential pretension presented to the ED with shortness of breath, productive cough subjective fever onset 7 days 10 days PTA. Seen at the local urgent care 1 to 2 days into the symptoms likely had respiratory tract infection apparently viral panel was negative discharged with Augmentin. In the ED afebrile heart rate 90s to 100 vitals otherwise stable with tachypnea 19-30, 80% on room air. Chest x-ray left lower lobe pneumonia possible left pleural effusion, EKG A-fib with PVC, labs with mild hypokalemia BNP 205 lactic acid 1.2 WBC count 9.9 INR 1.8 COVID RSV PCR influenza negative. Patient was admitted for pneumonia.  Also noted to have diffuse wheezing, with her lung standing history of secondhand smoking concern about COPD versus reactive airway disease, treated with bronchodilators, steroids additionally.  Patient clinically improved doing well ambulating and not needing oxygen.  Subjective:  Patient seen and examined this morning  Doing well without oxygen on room air but on ambulation dropped to 86%  Also having wheezing  Wants to go home, anxious about need for stay 1 more day  Family at the bedside Had coughing spell yesterday evening.  Assessment and Plan: Principal Problem:   CAP (community acquired pneumonia) Active Problems:   Essential hypertension   Hypokalemia   Chronic diastolic CHF (congestive heart failure) (HCC)   H/O mechanical aortic valve replacement   OSA on CPAP   Subtherapeutic anticoagulation   HLD (hyperlipidemia)   Acute hypoxic respiratory failure (HCC)   Acquired hypothyroidism    Persistent atrial fibrillation (HCC)   Subtherapeutic international normalized ratio (INR)   Depression   LLL CAP Upper respiratory illness x 7 to 10 days Acute hypoxic respiratory failure due to pneumonia: Overall clinically improving, able to weaned off to room air but needing oxygen on ambulation with PT this morning.  Still wheezing. blood culture  ngtd. . Continue ceftriaxone/Azithro to complete course x 5 days, wean off o2,continue bronchodilator Encourage PT OT ambulation.  Continue IV steroids and other bronchodilators ics/laba/lama, added antitussives  ? if she has copd at baseline from second hand smoking or reactive airway disease. She will need PFT as OP, add iv steroid,   Hypokalemia-resolved  Persistent A-fib on Coumadin History of mechanical aortic valve replacement Subtherapeutic INR: Pharmacy managing Coumadin-INR supratherapeutic, Continue Coreg 3.125, digoxin Recent Labs  Lab 03/23/23 1728 03/24/23 0807 03/25/23 0625 03/26/23 0623  INR 1.8* 2.1* 2.9* 3.2*    Chronic diastolic CHF: Volume status stable.  Continue home Entresto, digoxin, Lasix and Coreg.Cont to monitor daily I/O,weight, electrolytes Net IO Since Admission: 471.03 mL [03/26/23 1104]  Filed Weights   03/24/23 0633 03/25/23 0421 03/26/23 0611  Weight: 83.8 kg 89.4 kg 85.8 kg    HLD continue statin Hypothyroidism: Continue Synthroid OSA-CPAP bedtime w/ 02.  Has not been using CPAP for few days due to respiratory issues Anxiety/depression : Continue Abilify, BuSpar .  Needing Xanax as PRN.  Class I Obesity:Patient's Body mass index is 31.97 kg/m. : Will benefit with PCP follow-up, weight loss  healthy lifestyle and outpatient sleep evaluation.  DVT prophylaxis: SCDs Start: 03/23/23 2020 Code Status:   Code Status: Full Code Family Communication: plan of care discussed with patient/sister at bedside. Patient status is: Remains hospitalized because of severity  of illness Level of care: Telemetry  Medical   Dispo: The patient is from: Home  with husband and independent.            Anticipated disposition: Anticipate discharge home in 1 to 2 days  Objective: Vitals last 24 hrs: Vitals:   03/25/23 2018 03/26/23 0514 03/26/23 0611 03/26/23 0804  BP: 112/67 111/80  129/79  Pulse: 85 73  74  Resp: 18 18  18   Temp: 98 F (36.7 C) (!) 97.3 F (36.3 C)  (!) 97.5 F (36.4 C)  TempSrc: Oral Oral    SpO2: 95% 93%  97%  Weight:   85.8 kg   Height:       Weight change: -3.6 kg  Physical Examination: General exam: alert awake, oriented HEENT:Oral mucosa moist, Ear/Nose WNL grossly Respiratory system: Bilaterally expiratory wheezing, no use of accessory muscle Cardiovascular system: S1 & S2 +, No JVD. Gastrointestinal system: Abdomen soft,NT,ND, BS+ Nervous System: Alert, awake, moving all extremities,and following commands. Extremities: LE edema neg,distal peripheral pulses palpable and warm.  Skin: No rashes,no icterus. MSK: Normal muscle bulk,tone, power   Medications reviewed: Scheduled Meds:  arformoterol  15 mcg Nebulization BID   ARIPiprazole  2 mg Oral BID   azithromycin  500 mg Oral Daily   budesonide (PULMICORT) nebulizer solution  0.25 mg Nebulization BID   buPROPion  150 mg Oral Daily   carvedilol  3.125 mg Oral BID WC   digoxin  0.125 mg Oral Daily   furosemide  80 mg Oral Daily   guaiFENesin  600 mg Oral BID   levothyroxine  100 mcg Oral QAC breakfast   methylPREDNISolone (SOLU-MEDROL) injection  40 mg Intravenous Q12H   oxymetazoline  1 spray Each Nare BID   potassium chloride SA  40 mEq Oral Daily   revefenacin  175 mcg Nebulization Daily   rosuvastatin  20 mg Oral QHS   sacubitril-valsartan  1 tablet Oral BID   Warfarin - Pharmacist Dosing Inpatient   Does not apply q1600  Continuous Infusions:  cefTRIAXone (ROCEPHIN)  IV 2 g (03/25/23 1557)    Diet Order             Diet regular Room service appropriate? Yes; Fluid consistency: Thin  Diet effective  now                 No intake or output data in the 24 hours ending 03/26/23 1104  Net IO Since Admission: 471.03 mL [03/26/23 1104]  Wt Readings from Last 3 Encounters:  03/26/23 85.8 kg  02/04/23 85.7 kg  06/29/22 90.4 kg     Unresulted Labs (From admission, onward)     Start     Ordered   03/24/23 0500  Protime-INR  Daily,   R      03/23/23 2210   03/24/23 0500  CBC  Daily,   R      03/23/23 2210          Data Reviewed: I have personally reviewed following labs and imaging studies CBC: Recent Labs  Lab 03/23/23 1728 03/24/23 0807 03/25/23 0625 03/26/23 0623  WBC 9.9 9.6 5.7 8.6  NEUTROABS 7.1 6.5  --   --   HGB 12.4 11.4* 11.3* 11.2*  HCT 36.3 34.3* 34.3* 33.6*  MCV 88.5 90.0 90.7 90.6  PLT 225 221 249 260   Basic Metabolic Panel:  Recent Labs  Lab 03/23/23 1728 03/24/23 0807  NA 136 139  K 3.3* 3.6  CL 96* 103  CO2 26 27  GLUCOSE 115* 111*  BUN 28* 24*  CREATININE 1.15* 1.12*  CALCIUM 8.8* 8.6*  MG 2.0 2.1  PHOS  --  3.5   GFR: Estimated Creatinine Clearance: 45.1 mL/min (A) (by C-G formula based on SCr of 1.12 mg/dL (H)). Liver Function Tests:  Recent Labs  Lab 03/23/23 1728 03/24/23 0807  AST 38 46*  ALT 28 36  ALKPHOS 69 72  BILITOT 0.9 0.8  PROT 6.9 6.1*  ALBUMIN 3.4* 2.9*   Recent Results (from the past 240 hours)  Culture, blood (Routine x 2)     Status: None (Preliminary result)   Collection Time: 03/23/23  5:20 PM   Specimen: BLOOD RIGHT HAND  Result Value Ref Range Status   Specimen Description BLOOD RIGHT HAND  Final   Special Requests   Final    BOTTLES DRAWN AEROBIC AND ANAEROBIC Blood Culture results may not be optimal due to an inadequate volume of blood received in culture bottles   Culture   Final    NO GROWTH 3 DAYS Performed at [facility], [address], [place], [place] [address]    Report Status PENDING  Incomplete  Resp panel by RT-PCR (RSV, Flu A&B, Covid) Anterior Nasal Swab     Status: None    Collection Time: 03/23/23  6:17 PM   Specimen: Anterior Nasal Swab  Result Value Ref Range Status   SARS Coronavirus 2 by RT PCR NEGATIVE NEGATIVE Final   Influenza A by PCR NEGATIVE NEGATIVE Final   Influenza B by PCR NEGATIVE NEGATIVE Final    Comment: (NOTE) The Xpert Xpress SARS-CoV-2/FLU/RSV plus assay is intended as an aid in the diagnosis of influenza from Nasopharyngeal swab specimens and should not be used as a sole basis for treatment. Nasal washings and aspirates are unacceptable for Xpert Xpress SARS-CoV-2/FLU/RSV testing.  Fact Sheet for Patients: BloggerCourse.com  Fact Sheet for Healthcare Providers: SeriousBroker.it  This test is not yet approved or cleared by the [place] FDA and has been authorized for detection and/or diagnosis of SARS-CoV-2 by FDA under an Emergency Use Authorization (EUA). This EUA will remain in effect (meaning this test can be used) for the duration of the COVID-19 declaration under Section 564(b)(1) of the Act, 21 U.S.C. section 360bbb-3(b)(1), unless the authorization is terminated or revoked.     Resp Syncytial Virus by PCR NEGATIVE NEGATIVE Final    Comment: (NOTE) Fact Sheet for Patients: BloggerCourse.com  Fact Sheet for Healthcare Providers: SeriousBroker.it  This test is not yet approved or cleared by the [place] FDA and has been authorized for detection and/or diagnosis of SARS-CoV-2 by FDA under an Emergency Use Authorization (EUA). This EUA will remain in effect (meaning this test can be used) for the duration of the COVID-19 declaration under Section 564(b)(1) of the Act, 21 U.S.C. section 360bbb-3(b)(1), unless the authorization is terminated or revoked.  Performed at [facility], [address], [place], [place] [address]   Culture, blood (Routine x 2)     Status: None (Preliminary result)   Collection  Time: 03/23/23  6:20 PM   Specimen: BLOOD  Result Value Ref Range Status   Specimen Description BLOOD RIGHT ANTECUBITAL  Final   Special Requests   Final    BOTTLES DRAWN AEROBIC AND ANAEROBIC Blood Culture results may not be optimal due to an inadequate volume of blood received in culture bottles   Culture   Final    NO GROWTH 3 DAYS Performed  at [facility], [address] [place], [place] [address]    Report Status PENDING  Incomplete  Antimicrobials/Microbiology: Anti-infectives (From admission, onward)    Start     Dose/Rate Route Frequency Ordered Stop   03/26/23 1015  azithromycin (ZITHROMAX) tablet 500 mg        500 mg Oral Daily 03/26/23 0923 03/28/23 0959   03/25/23 1500  cefTRIAXone (ROCEPHIN) 2 g in sodium chloride 0.9 % 100 mL IVPB        2 g 200 mL/hr over 30 Minutes Intravenous Every 24 hours 03/25/23 1012 03/27/23 2359   03/24/23 1500  azithromycin (ZITHROMAX) 500 mg in sodium chloride 0.9 % 250 mL IVPB  Status:  Discontinued        500 mg 250 mL/hr over 60 Minutes Intravenous Every 24 hours 03/23/23 2022 03/26/23 0922   03/24/23 1500  cefTRIAXone (ROCEPHIN) 1 g in sodium chloride 0.9 % 100 mL IVPB  Status:  Discontinued        1 g 200 mL/hr over 30 Minutes Intravenous Every 24 hours 03/23/23 2022 03/25/23 1012   03/23/23 1900  cefTRIAXone (ROCEPHIN) 1 g in sodium chloride 0.9 % 100 mL IVPB        1 g 200 mL/hr over 30 Minutes Intravenous  Once 03/23/23 1856 03/23/23 1947   03/23/23 1900  azithromycin (ZITHROMAX) 500 mg in sodium chloride 0.9 % 250 mL IVPB        500 mg 250 mL/hr over 60 Minutes Intravenous  Once 03/23/23 1856 03/23/23 2049         Component Value Date/Time   SDES BLOOD RIGHT ANTECUBITAL 03/23/2023 1820   SPECREQUEST  03/23/2023 1820    BOTTLES DRAWN AEROBIC AND ANAEROBIC Blood Culture results may not be optimal due to an inadequate volume of blood received in culture bottles   CULT  03/23/2023 1820    NO GROWTH 3 DAYS Performed at  [facility], [address], [place], [place] [address]    REPTSTATUS PENDING 03/23/2023 1820     Radiology Studies: No results found.  LOS: 3 days   Total time spent in review of labs and imaging, patient evaluation, formulation of plan, documentation and communication with family: 35 minutes  Lanae Boast, MD [facility]  03/26/2023, 11:04 AM

## 2023-03-26 NOTE — Plan of Care (Signed)
  Problem: Education: Goal: Knowledge of General Education information will improve Description: Including pain rating scale, medication(s)/side effects and non-pharmacologic comfort measures 03/26/2023 1453 by Rudean Curt, RN Outcome: Progressing 03/26/2023 1452 by Rudean Curt, RN Outcome: Progressing   Problem: Health Behavior/Discharge Planning: Goal: Ability to manage health-related needs will improve 03/26/2023 1453 by Rudean Curt, RN Outcome: Progressing 03/26/2023 1452 by Rudean Curt, RN Outcome: Progressing   Problem: Clinical Measurements: Goal: Ability to maintain clinical measurements within normal limits will improve 03/26/2023 1453 by Rudean Curt, RN Outcome: Progressing 03/26/2023 1452 by Rudean Curt, RN Outcome: Progressing Goal: Will remain free from infection 03/26/2023 1453 by Rudean Curt, RN Outcome: Progressing 03/26/2023 1452 by Rudean Curt, RN Outcome: Progressing Goal: Diagnostic test results will improve 03/26/2023 1453 by Rudean Curt, RN Outcome: Progressing 03/26/2023 1452 by Rudean Curt, RN Outcome: Progressing Goal: Respiratory complications will improve 03/26/2023 1453 by Rudean Curt, RN Outcome: Progressing 03/26/2023 1452 by Rudean Curt, RN Outcome: Progressing Goal: Cardiovascular complication will be avoided 03/26/2023 1453 by Rudean Curt, RN Outcome: Progressing 03/26/2023 1452 by Rudean Curt, RN Outcome: Progressing   Problem: Activity: Goal: Risk for activity intolerance will decrease 03/26/2023 1453 by Rudean Curt, RN Outcome: Progressing 03/26/2023 1452 by Rudean Curt, RN Outcome: Progressing   Problem: Nutrition: Goal: Adequate nutrition will be maintained 03/26/2023 1453 by Rudean Curt, RN Outcome: Progressing 03/26/2023 1452 by Rudean Curt, RN Outcome: Progressing   Problem: Coping: Goal: Level  of anxiety will decrease 03/26/2023 1453 by Rudean Curt, RN Outcome: Progressing 03/26/2023 1452 by Rudean Curt, RN Outcome: Progressing   Problem: Elimination: Goal: Will not experience complications related to bowel motility 03/26/2023 1453 by Rudean Curt, RN Outcome: Progressing 03/26/2023 1452 by Rudean Curt, RN Outcome: Progressing Goal: Will not experience complications related to urinary retention 03/26/2023 1453 by Rudean Curt, RN Outcome: Progressing 03/26/2023 1452 by Rudean Curt, RN Outcome: Progressing   Problem: Pain Management: Goal: General experience of comfort will improve 03/26/2023 1453 by Rudean Curt, RN Outcome: Progressing 03/26/2023 1452 by Rudean Curt, RN Outcome: Progressing   Problem: Safety: Goal: Ability to remain free from injury will improve 03/26/2023 1453 by Rudean Curt, RN Outcome: Progressing 03/26/2023 1452 by Rudean Curt, RN Outcome: Progressing   Problem: Skin Integrity: Goal: Risk for impaired skin integrity will decrease 03/26/2023 1453 by Rudean Curt, RN Outcome: Progressing 03/26/2023 1452 by Rudean Curt, RN Outcome: Progressing   Problem: Education: Goal: Ability to state activities that reduce stress will improve 03/26/2023 1453 by Rudean Curt, RN Outcome: Progressing 03/26/2023 1452 by Rudean Curt, RN Outcome: Progressing   Problem: Coping: Goal: Ability to identify and develop effective coping behavior will improve 03/26/2023 1453 by Rudean Curt, RN Outcome: Progressing 03/26/2023 1452 by Rudean Curt, RN Outcome: Progressing   Problem: Self-Concept: Goal: Ability to identify factors that promote anxiety will improve 03/26/2023 1453 by Rudean Curt, RN Outcome: Progressing 03/26/2023 1452 by Rudean Curt, RN Outcome: Progressing Goal: Level of anxiety will decrease 03/26/2023 1453 by  Rudean Curt, RN Outcome: Progressing 03/26/2023 1452 by Rudean Curt, RN Outcome: Progressing Goal: Ability to modify response to factors that promote anxiety will improve 03/26/2023 1453 by Rudean Curt, RN Outcome: Progressing 03/26/2023 1452 by Rudean Curt, RN Outcome: Progressing

## 2023-03-26 NOTE — Progress Notes (Signed)
   03/26/23 1947  BiPAP/CPAP/SIPAP  Reason BIPAP/CPAP not in use Non-compliant (Patient states makes her cough worse)  BiPAP/CPAP /SiPAP Vitals  Pulse Rate 80  Resp 18  SpO2 95 %  Bilateral Breath Sounds Clear;Diminished  MEWS Score/Color  MEWS Score 0  MEWS Score Color [name]

## 2023-03-27 ENCOUNTER — Other Ambulatory Visit (HOSPITAL_COMMUNITY): Payer: Self-pay

## 2023-03-27 DIAGNOSIS — J189 Pneumonia, unspecified organism: Secondary | ICD-10-CM | POA: Diagnosis not present

## 2023-03-27 LAB — CBC
HCT: 37 % (ref 36.0–46.0)
Hemoglobin: 12.3 g/dL (ref 12.0–15.0)
MCH: 30.4 pg (ref 26.0–34.0)
MCHC: 33.2 g/dL (ref 30.0–36.0)
MCV: 91.4 fL (ref 80.0–100.0)
Platelets: 299 10*3/uL (ref 150–400)
RBC: 4.05 MIL/uL (ref 3.87–5.11)
RDW: 13.1 % (ref 11.5–15.5)
WBC: 9.7 10*3/uL (ref 4.0–10.5)
nRBC: 0 % (ref 0.0–0.2)

## 2023-03-27 LAB — PROTIME-INR
INR: 2.7 — ABNORMAL HIGH (ref 0.8–1.2)
Prothrombin Time: 28.8 s — ABNORMAL HIGH (ref 11.4–15.2)

## 2023-03-27 MED ORDER — HYDROCOD POLI-CHLORPHE POLI ER 10-8 MG/5ML PO SUER
5.0000 mL | Freq: Two times a day (BID) | ORAL | 0 refills | Status: DC | PRN
  Filled 2023-03-27: qty 70, 7d supply, fill #0

## 2023-03-27 MED ORDER — WARFARIN SODIUM 2 MG PO TABS
2.0000 mg | ORAL_TABLET | Freq: Once | ORAL | Status: DC
  Filled 2023-03-27: qty 1

## 2023-03-27 MED ORDER — PREDNISONE 20 MG PO TABS
40.0000 mg | ORAL_TABLET | Freq: Every day | ORAL | Status: DC
  Administered 2023-03-27: 40 mg via ORAL
  Filled 2023-03-27: qty 2

## 2023-03-27 MED ORDER — PREDNISONE 20 MG PO TABS
ORAL_TABLET | ORAL | 0 refills | Status: AC
  Filled 2023-03-27: qty 11, 9d supply, fill #0

## 2023-03-27 MED ORDER — GUAIFENESIN ER 600 MG PO TB12
600.0000 mg | ORAL_TABLET | Freq: Two times a day (BID) | ORAL | 0 refills | Status: AC
  Filled 2023-03-27: qty 14, 7d supply, fill #0

## 2023-03-27 MED ORDER — CEFTRIAXONE SODIUM 2 G IJ SOLR
2.0000 g | INTRAMUSCULAR | Status: AC
  Administered 2023-03-27: 2 g via INTRAVENOUS
  Filled 2023-03-27: qty 20

## 2023-03-27 NOTE — Progress Notes (Signed)
PHARMACY - ANTICOAGULATION CONSULT NOTE  Pharmacy Consult for Warfarin Indication: atrial fibrillation and mechanical AVR  Allergies  Allergen Reactions   Codeine Anaphylaxis    Jerking, involuntary jerking    Dilantin [Phenytoin Sodium Extended] Rash and Itching   Metoprolol Other (See Comments)    depression    Propoxyphene Other (See Comments)    Bad vivid dreams    Patient Measurements: Height: 5' 4.5" (163.8 cm) Weight: 85.8 kg (189 lb 2.5 oz) IBW/kg (Calculated) : 55.85 Heparin Dosing Weight: 74.6 kg  Vital Signs: Temp: 99 F (37.2 C) (12/18 0721) Temp Source: Oral (12/18 0721) BP: 126/79 (12/18 0721) Pulse Rate: 95 (12/18 0858)  Labs: Recent Labs    03/25/23 0625 03/26/23 0623 03/27/23 0626  HGB 11.3* 11.2* 12.3  HCT 34.3* 33.6* 37.0  PLT 249 260 299  LABPROT 30.4* 33.0* 28.8*  INR 2.9* 3.2* 2.7*    Estimated Creatinine Clearance: 45.1 mL/min (A) (by C-G formula based on SCr of 1.12 mg/dL (H)).   Medical History: Past Medical History:  Diagnosis Date   Anxiety    Aortic stenosis    status post aortic valve replacement with 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   Depression    DJD (degenerative joint disease) of knee    Dyslipidemia    GERD (gastroesophageal reflux disease)    "several years ago; none since" (11/12/2012)   Hyperlipidemia    Hyperlipidemia LDL goal <70 01/19/2017   Hypertension    Left atrial enlargement    Mitral regurgitation 06/25/2016   Mild moderate MR by echo 06/2016   Obesity    Persistent atrial fibrillation (HCC)    s/p afib ablation x 2 at [facility]   Sleep apnea    On CPAP at 12cm H2O   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   Past Month   amoxicillin (AMOXIL) 875 MG tablet Take 875 mg by mouth 2 (two) times daily. 10 days   03/22/2023   ARIPiprazole (ABILIFY) 2 MG tablet Take 1 tablet (2 mg total) by mouth 2 (two) times daily. 180 tablet 3 03/22/2023   buPROPion (WELLBUTRIN XL) 150 MG 24 hr tablet Take 1 tablet (150 mg total) by mouth daily. 90 tablet 3 03/22/2023   calcium-vitamin D (OSCAL WITH D) 500-200 MG-UNIT per tablet Take 2 tablets by mouth in the morning.   03/22/2023   carvedilol (COREG) 3.125 MG tablet Take 1 tablet (3.125 mg total) by mouth 2 (two) times daily with a meal. 180 tablet 1 03/22/2023   Cholecalciferol (VITAMIN D) 2000 UNITS tablet Take 4,000 Units by mouth in the morning.   03/22/2023   desvenlafaxine (PRISTIQ) 100 MG 24 hr tablet Take 1 tablet (100 mg total) by mouth daily. 90 tablet 3 03/22/2023   digoxin (LANOXIN) 0.125 MG tablet Take 1 tablet (0.125 mg total) by mouth daily. 90 tablet 1 03/22/2023   famotidine (PEPCID) 10 MG tablet Take 10 mg by mouth as needed for heartburn or indigestion.   03/22/2023   ferrous  sulfate 325 (65 FE) MG tablet Take 325 mg by mouth in the morning.   03/22/2023   furosemide (LASIX) 80 MG tablet Take 1 tablet by mouth daily. OKAY TO TAKE EXTRA FOR WEIGHT GAIN> 3LB/ DAILY OR 5LB/WEEKLY 180 tablet 3 03/22/2023   levothyroxine (SYNTHROID, LEVOTHROID) 100 MCG tablet Take 100 mcg daily before breakfast by mouth.   03/22/2023   Multiple Vitamin (MULTIVITAMIN WITH MINERALS) TABS Take 1 tablet by mouth in the morning.   03/22/2023   oxymetazoline (AFRIN) 0.05 % nasal spray Place 1 spray into both nostrils daily as needed for congestion.   03/22/2023   potassium chloride SA (KLOR-CON M) 20 MEQ tablet TAKE 2 TABLETS(40 MEQ) BY MOUTH DAILY 180 tablet 2 03/22/2023   rosuvastatin (CRESTOR) 20 MG tablet TAKE 1 TABLET BY  MOUTH EVERY DAY 90 tablet 3 03/22/2023   sacubitril-valsartan (ENTRESTO) 97-103 MG TAKE 1 TABLET BY MOUTH TWICE A DAY 180 tablet 3 03/22/2023   traZODone (DESYREL) 50 MG tablet Take 1 tablet (50 mg total) by mouth at bedtime as needed. 90 tablet 3 03/22/2023   tretinoin (RETIN-A) 0.05 % cream Apply 1 application topically at bedtime. Applied to face   03/22/2023   warfarin (COUMADIN) 4 MG tablet TAKE 1 TABLET BY MOUTH EVERY DAY AS DIRECTED BY COUMADIN CLINIC (Patient taking differently: Take 4 mg by mouth daily at 4 PM. TAKE 1 TABLET BY MOUTH EVERY DAY AS DIRECTED BY COUMADIN CLINIC) 90 tablet 0 03/22/2023 at  9:30 PM    Assessment: 77 yo F presents to ED for SOB. Pt has a history for chronic diastolic HF, persistent Afib (on warfarin), mechanical AVR, OSA (CPAP at night), and HTN. Pharmacy consulted to dose warfarin for Afib w/ mechanical AVR.   Home warfarin regimen: 4mg  po daily  Last warfarin dose prior to admission was 12/13 @ 21:30  INR down to 2.7 after holding 2 doses. INR sensitivity due to abx.  Goal of Therapy:  INR: 2-2.5 per anticoag clinic (due to history of ICH in 2006 with INR ~2.4) Monitor platelets by anticoagulation protocol: Yes   Plan:  Coumadin 2mg  PO x1 Monitor daily CBC, INR, and for s/sx of bleeding Monitor for new drug-drug interactions   Ulyses Southward, PharmD, BCIDP, AAHIVP, CPP Infectious Disease Pharmacist 03/27/2023 9:18 AM

## 2023-03-27 NOTE — Progress Notes (Signed)
Mobility Specialist Progress Note:    03/27/23 0849  Mobility  Activity Ambulated independently in hallway  Level of Assistance Standby assist, set-up cues, supervision of patient - no hands on  Assistive Device None  Distance Ambulated (ft) 350 ft  Activity Response Tolerated well  Mobility Referral Yes  Mobility visit 1 Mobility  Mobility Specialist Start Time (ACUTE ONLY) [id number]  Mobility Specialist Stop Time (ACUTE ONLY) 0849  Mobility Specialist Time Calculation (min) (ACUTE ONLY) 7 min   Pt received in chair, agreeable to mobility session. Ambulated in hallway with SBA, no AD required. SpO2 93-95% on RA throughout session. Tolerated well, asx throughout. Returned pt to room, left with all needs met.   Feliciana Rossetti Mobility Specialist Please contact via Special educational needs teacher or  Rehab office at [phone]

## 2023-03-27 NOTE — Discharge Summary (Addendum)
DISCHARGE SUMMARY  [name]  MR#: [id number]  DOB:09-10-1945  Date of Admission: 03/23/2023 Date of Discharge: 03/27/2023  Attending Physician:Cailean Heacock Silvestre Gunner, MD  Patient's WUJ:[id number], Meredith Broker, DO  Disposition: D/C home   Follow-up Appts:  Follow-up Information     Sabino Dick, DO Follow up in 10 day(s).   Specialty: Family Medicine Contact information: [address] [place] [address] [phone]         Coumadin monitoring clinic Follow up.   Why: See you usual coumadin monitoring clinic in 3 days to have a follow up check of your INR                Tests Needing Follow-up: -recheck of INR will be required in 3-4 days  -once pt has returned to her baseline respiratory status, outpatient PFTs should be arranged to consider a diagnosis of COPD   Discharge Diagnoses: LLL CAP Acute hypoxic respiratory failure (acute respiratory symptoms with presenting O2 sat 88% on room air) Wheezing - r/o COPD  Persistent atrial fibrillation on chronic Coumadin therapy Hypokalemia Status post mechanical aortic valve replacement Chronic diastolic CHF HLD Hypothyroidism OSA Class I obesity - Body mass index is 31.97 kg/m  Initial presentation: 77 year old with a history of chronic diastolic CHF, persistent AF on warfarin, mechanical aortic valve, OSA on CPAP, and HTN who presented to the ED 03/23/2023 with productive cough and subjective fever and SOB. In the ED a CXR revealed a left lower lobe infiltrate and a possible effusion.   Hospital Course:  LLL CAP with acute hypoxic respiratory failure Clinically improving with appropriate therapy - blood cultures negative - weaned to RA prior to d/c - stable for d/c home today    Wheezing Multiple episodes of wheezing have been appreciated during this hospital stay requiring treatment with IV steroids and bronchodilators -patient does not smoke but has a longstanding  history of secondhand smoke exposure -consideration should be given to PFTs in the outpatient setting once she has clinically recovered from her active illness - she is being discharged on a tapering course of steroids but no inhalers for now   Persistent atrial fibrillation on chronic Coumadin therapy Heart rate well-controlled at time of discharge -patient is established in the clinic that monitors her Coumadin as an form she needs to follow-up with them as per usual for ongoing monitoring  Hypokalemia Corrected with supplementation   Status post mechanical aortic valve replacement On chronic Coumadin therapy -INR managed per pharmacy while inpatient -patient has an established outpatient clinic for follow-up and is advised to return to the clinic for ongoing management of her Coumadin   Chronic diastolic CHF Continue usual home Entresto digoxin Lasix and Coreg -clinically well compensated during this admission   HLD Continue usual statin   Hypothyroidism Continue usual Synthroid dose   OSA Continue usual home CPAP regimen   Class I obesity - Body mass index is 31.97 kg/m  Allergies as of 03/27/2023       Reactions   Codeine Anaphylaxis   Jerking, involuntary jerking   Dilantin [phenytoin Sodium Extended] Rash, Itching   Metoprolol Other (See Comments)   depression   Propoxyphene Other (See Comments)   Bad vivid dreams        Medication List     STOP taking these medications    amoxicillin 875 MG tablet Commonly known as: AMOXIL       TAKE these medications    ARIPiprazole 2 MG tablet Commonly known  as: Abilify Take 1 tablet (2 mg total) by mouth 2 (two) times daily.   buPROPion 150 MG 24 hr tablet Commonly known as: Wellbutrin XL Take 1 tablet (150 mg total) by mouth daily.   calcium-vitamin D 500-200 MG-UNIT tablet Commonly known as: OSCAL WITH D Take 2 tablets by mouth in the morning.   carvedilol 3.125 MG tablet Commonly known as: COREG Take 1  tablet (3.125 mg total) by mouth 2 (two) times daily with a meal.   chlorpheniramine-HYDROcodone 10-8 MG/5ML Commonly known as: TUSSIONEX Take 5 mLs by mouth every 12 (twelve) hours as needed for cough (Persistent cough).   desvenlafaxine 100 MG 24 hr tablet Commonly known as: PRISTIQ Take 1 tablet (100 mg total) by mouth daily.   digoxin 0.125 MG tablet Commonly known as: LANOXIN Take 1 tablet (0.125 mg total) by mouth daily.   Entresto 97-103 MG Generic drug: sacubitril-valsartan TAKE 1 TABLET BY MOUTH TWICE A DAY   famotidine 10 MG tablet Commonly known as: PEPCID Take 10 mg by mouth as needed for heartburn or indigestion.   ferrous sulfate 325 (65 FE) MG tablet Take 325 mg by mouth in the morning.   furosemide 80 MG tablet Commonly known as: LASIX Take 1 tablet by mouth daily. OKAY TO TAKE EXTRA FOR WEIGHT GAIN> 3LB/ DAILY OR 5LB/WEEKLY   guaiFENesin 600 MG 12 hr tablet Commonly known as: MUCINEX Take 1 tablet (600 mg total) by mouth 2 (two) times daily for 7 days.   levothyroxine 100 MCG tablet Commonly known as: SYNTHROID Take 100 mcg daily before breakfast by mouth.   multivitamin with minerals Tabs tablet Take 1 tablet by mouth in the morning.   oxymetazoline 0.05 % nasal spray Commonly known as: AFRIN Place 1 spray into both nostrils daily as needed for congestion.   potassium chloride SA 20 MEQ tablet Commonly known as: KLOR-CON M TAKE 2 TABLETS(40 MEQ) BY MOUTH DAILY   predniSONE 20 MG tablet Commonly known as: DELTASONE 2 tablets a day for 3 days, then 1 tablet a day for 3 days, then 1/2 tablet a day for 3 days, then stop Start taking on: March 28, 2023   rosuvastatin 20 MG tablet Commonly known as: CRESTOR TAKE 1 TABLET BY MOUTH EVERY DAY   traZODone 50 MG tablet Commonly known as: DESYREL Take 1 tablet (50 mg total) by mouth at bedtime as needed.   tretinoin 0.05 % cream Commonly known as: RETIN-A Apply 1 application topically at  bedtime. Applied to face   TYLENOL 500 MG tablet Generic drug: acetaminophen Take 500 mg by mouth as needed.   Vitamin D 50 MCG (2000 UT) tablet Take 4,000 Units by mouth in the morning.   warfarin 4 MG tablet Commonly known as: COUMADIN Take as directed. If you are unsure how to take this medication, talk to your nurse or doctor. Original instructions: TAKE 1 TABLET BY MOUTH EVERY DAY AS DIRECTED BY COUMADIN CLINIC What changed: See the new instructions.        Day of Discharge BP 126/79 (BP Location: Right Arm)   Pulse 95   Temp 99 F (37.2 C) (Oral)   Resp 17   Ht 5' 4.5" (1.638 m)   Wt 85.8 kg   SpO2 96%   BMI 31.97 kg/m   Physical Exam: General: No acute respiratory distress Lungs: Clear to auscultation bilaterally without wheezes or crackles Cardiovascular: Regular rate and rhythm without murmur gallop or rub normal S1 and S2 Abdomen: Nontender, nondistended, soft,  bowel sounds positive, no rebound, no ascites, no appreciable mass Extremities: No significant cyanosis, clubbing, or edema bilateral lower extremities  Basic Metabolic Panel: Recent Labs  Lab 03/23/23 1728 03/24/23 0807  NA 136 139  K 3.3* 3.6  CL 96* 103  CO2 26 27  GLUCOSE 115* 111*  BUN 28* 24*  CREATININE 1.15* 1.12*  CALCIUM 8.8* 8.6*  MG 2.0 2.1  PHOS  --  3.5    CBC: Recent Labs  Lab 03/23/23 1728 03/24/23 0807 03/25/23 0625 03/26/23 0623 03/27/23 0626  WBC 9.9 9.6 5.7 8.6 9.7  NEUTROABS 7.1 6.5  --   --   --   HGB 12.4 11.4* 11.3* 11.2* 12.3  HCT 36.3 34.3* 34.3* 33.6* 37.0  MCV 88.5 90.0 90.7 90.6 91.4  PLT 225 221 249 260 299    Time spent in discharge (includes decision making & examination of pt): 35 minutes  03/27/2023, 2:03 PM   Lonia Blood, MD [facility] Office  [phone]

## 2023-03-28 ENCOUNTER — Telehealth: Payer: Self-pay | Admitting: *Deleted

## 2023-03-28 LAB — CULTURE, BLOOD (ROUTINE X 2)
Culture: NO GROWTH
Culture: NO GROWTH

## 2023-03-28 NOTE — Telephone Encounter (Signed)
Pt called to report she was released from Hospital on 03/27/23 and was started on prednisone taper dose. Advised taht med does interact with warfarin and can make INR go up. Advised to take warfarin 2mg  on Friday and Saturday and keep regular 4mg  dose on Sunday and report to have INR checked on Monday at 915am. Also, advised to keep leafy veggies in her diet and increase over next 3 days.  Prednisone dose 40mg  x 3days, 20mg  x 3 days, 10mg  x 3days

## 2023-04-01 ENCOUNTER — Ambulatory Visit: Payer: Medicare Other | Attending: Cardiology

## 2023-04-01 DIAGNOSIS — I480 Paroxysmal atrial fibrillation: Secondary | ICD-10-CM | POA: Diagnosis not present

## 2023-04-01 DIAGNOSIS — Z7901 Long term (current) use of anticoagulants: Secondary | ICD-10-CM | POA: Diagnosis not present

## 2023-04-01 DIAGNOSIS — Z5181 Encounter for therapeutic drug level monitoring: Secondary | ICD-10-CM | POA: Insufficient documentation

## 2023-04-01 LAB — POCT INR: INR: 1.3 — AB (ref 2.0–3.0)

## 2023-04-01 NOTE — Patient Instructions (Signed)
Take 2 tablets today only then Continue taking Warfarin 1 tablet daily. Eat greens Tuesday, Thursday and Saturday.  Finishing Prednisone 12/28 Recheck INR in 2 weeks.   Coumadin Clinic [phone]

## 2023-04-08 DIAGNOSIS — Z09 Encounter for follow-up examination after completed treatment for conditions other than malignant neoplasm: Secondary | ICD-10-CM | POA: Diagnosis not present

## 2023-04-08 DIAGNOSIS — I4821 Permanent atrial fibrillation: Secondary | ICD-10-CM | POA: Diagnosis not present

## 2023-04-16 ENCOUNTER — Ambulatory Visit: Payer: Medicare Other

## 2023-04-23 ENCOUNTER — Other Ambulatory Visit: Payer: Self-pay | Admitting: Family Medicine

## 2023-04-23 ENCOUNTER — Ambulatory Visit: Payer: Medicare Other | Attending: Cardiology

## 2023-04-23 DIAGNOSIS — I480 Paroxysmal atrial fibrillation: Secondary | ICD-10-CM | POA: Diagnosis not present

## 2023-04-23 DIAGNOSIS — Z7901 Long term (current) use of anticoagulants: Secondary | ICD-10-CM

## 2023-04-23 DIAGNOSIS — Z5181 Encounter for therapeutic drug level monitoring: Secondary | ICD-10-CM

## 2023-04-23 DIAGNOSIS — Z0000 Encounter for general adult medical examination without abnormal findings: Secondary | ICD-10-CM

## 2023-04-23 LAB — POCT INR: INR: 2.3 (ref 2.0–3.0)

## 2023-04-23 NOTE — Patient Instructions (Signed)
 Continue taking Warfarin 1 tablet daily.   Recheck INR in 4 weeks.   Coumadin Clinic [phone]

## 2023-04-25 ENCOUNTER — Ambulatory Visit (INDEPENDENT_AMBULATORY_CARE_PROVIDER_SITE_OTHER): Payer: Medicare Other | Admitting: Adult Health

## 2023-04-25 ENCOUNTER — Encounter: Payer: Self-pay | Admitting: Adult Health

## 2023-04-25 DIAGNOSIS — F331 Major depressive disorder, recurrent, moderate: Secondary | ICD-10-CM | POA: Diagnosis not present

## 2023-04-25 MED ORDER — ARIPIPRAZOLE 5 MG PO TABS: 5.0000 mg | ORAL_TABLET | Freq: Every day | ORAL | 2 refills | Status: DC

## 2023-04-25 NOTE — Progress Notes (Signed)
[name] [id number] [birth year] 78 y.o.  Subjective:   Patient ID:  [name] is a 78 y.o. (DOB 15-Jul-1945) female.  Chief Complaint: No chief complaint on file.   HPI [name] presents to the office today for follow-up of MDD.  Describes mood today as "ok". Pleasant. Reports decreased tearfulness "not as much". Mood symptoms - reports depression and irritability. Denies anxiety. Denies panic attacks. Denies worry, rumination, and over thinking. Reports mood has declined. Stating "I don't feel like I'm doing as well as I was". Feels like the addition of Abilify has been helpful to manage mood symptoms - taking 4mg  currently - would like to try increasing the dose to 5mg . Varying interest and motivation. Taking medications as prescribed.  Energy levels stable. Active, has not been exercising. Enjoys some usual interests and activities. Married. Lives with husband and 2 [place] terriers. Has a son, age 14. Has one step daughter - 19. Spending time with family. Attends church.  Appetite adequate. Weight loss - 182.2 from 186 pounds. Sleeps well most nights. Averages 9 hours. Denies daytime napping.   Focus and concentration stable. Completing tasks. Managing aspects of household. Retired - [employer]. Denies SI or HI.  Denies AH or VH. Denies self harm. Denies substance use.  Previous medication trials:  Xanax, Zoloft, Wellbutrin, Valium, Ambien, Lexapro   Flowsheet Row ED to Hosp-Admission (Discharged) from 03/23/2023 in [facility] ED from 10/27/2022 in [facility]  ED from 08/13/2022 in [facility] at [facility] RISK CATEGORY No Risk No Risk No Risk        Review of Systems:  Review of Systems  Musculoskeletal:  Negative for gait problem.  Neurological:  Negative for tremors.  Psychiatric/Behavioral:         Please refer to HPI    Medications: I have reviewed the patient's current  medications.  Current Outpatient Medications  Medication Sig Dispense Refill   acetaminophen (TYLENOL) 500 MG tablet Take 500 mg by mouth as needed.     ARIPiprazole (ABILIFY) 2 MG tablet Take 1 tablet (2 mg total) by mouth 2 (two) times daily. 180 tablet 3   buPROPion (WELLBUTRIN XL) 150 MG 24 hr tablet Take 1 tablet (150 mg total) by mouth daily. 90 tablet 3   calcium-vitamin D (OSCAL WITH D) 500-200 MG-UNIT per tablet Take 2 tablets by mouth in the morning.     carvedilol (COREG) 3.125 MG tablet Take 1 tablet (3.125 mg total) by mouth 2 (two) times daily with a meal. 180 tablet 1   chlorpheniramine-HYDROcodone (TUSSIONEX) 10-8 MG/5ML Take 5 mLs by mouth every 12 (twelve) hours as needed for cough (Persistent cough). 70 mL 0   Cholecalciferol (VITAMIN D) 2000 UNITS tablet Take 4,000 Units by mouth in the morning.     desvenlafaxine (PRISTIQ) 100 MG 24 hr tablet Take 1 tablet (100 mg total) by mouth daily. 90 tablet 3   digoxin (LANOXIN) 0.125 MG tablet Take 1 tablet (0.125 mg total) by mouth daily. 90 tablet 1   famotidine (PEPCID) 10 MG tablet Take 10 mg by mouth as needed for heartburn or indigestion.     ferrous sulfate 325 (65 FE) MG tablet Take 325 mg by mouth in the morning.     furosemide (LASIX) 80 MG tablet Take 1 tablet by mouth daily. OKAY TO TAKE EXTRA FOR WEIGHT GAIN> 3LB/ DAILY OR 5LB/WEEKLY 180 tablet 3   levothyroxine (SYNTHROID,  LEVOTHROID) 100 MCG tablet Take 100 mcg daily before breakfast by mouth.     Multiple Vitamin (MULTIVITAMIN WITH MINERALS) TABS Take 1 tablet by mouth in the morning.     oxymetazoline (AFRIN) 0.05 % nasal spray Place 1 spray into both nostrils daily as needed for congestion.     potassium chloride SA (KLOR-CON M) 20 MEQ tablet TAKE 2 TABLETS(40 MEQ) BY MOUTH DAILY 180 tablet 2   rosuvastatin (CRESTOR) 20 MG tablet TAKE 1 TABLET BY MOUTH EVERY DAY 90 tablet 3   sacubitril-valsartan (ENTRESTO) 97-103 MG TAKE 1 TABLET BY MOUTH TWICE A DAY 180 tablet 3    traZODone (DESYREL) 50 MG tablet Take 1 tablet (50 mg total) by mouth at bedtime as needed. 90 tablet 3   tretinoin (RETIN-A) 0.05 % cream Apply 1 application topically at bedtime. Applied to face     warfarin (COUMADIN) 4 MG tablet TAKE 1 TABLET BY MOUTH EVERY DAY AS DIRECTED BY COUMADIN CLINIC (Patient taking differently: Take 4 mg by mouth daily at 4 PM. TAKE 1 TABLET BY MOUTH EVERY DAY AS DIRECTED BY COUMADIN CLINIC) 90 tablet 0   No current facility-administered medications for this visit.    Medication Side Effects: None  Allergies:  Allergies  Allergen Reactions   Codeine Anaphylaxis    Jerking, involuntary jerking    Dilantin [Phenytoin Sodium Extended] Rash and Itching   Metoprolol Other (See Comments)    depression    Propoxyphene Other (See Comments)    Bad vivid dreams    Past Medical History:  Diagnosis Date   Anxiety    Aortic stenosis    status post aortic valve replacement with 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   Depression    DJD (degenerative joint disease) of knee    Dyslipidemia    GERD (gastroesophageal reflux disease)    "several years ago; none since" (11/12/2012)   Hyperlipidemia    Hyperlipidemia LDL goal <70 01/19/2017   Hypertension    Left atrial enlargement    Mitral regurgitation 06/25/2016   Mild moderate MR by echo 06/2016   Obesity    Persistent atrial fibrillation (HCC)    s/p afib ablation x 2 at [facility]   Sleep apnea    On CPAP at 12cm H2O   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   Please see review of systems for further details on the  patient's review from today.   Objective:   Physical Exam:  There were no vitals taken for this visit.  Physical Exam Constitutional:      General: She is not in acute distress. Musculoskeletal:        General: No deformity.  Neurological:     Mental Status: She is alert and oriented to person, place, and time.     Coordination: Coordination normal.  Psychiatric:        Attention and Perception: Attention and perception normal. She does not perceive auditory or visual hallucinations.        Mood and Affect: Affect is not labile, blunt, angry or inappropriate.  Speech: Speech normal.        Behavior: Behavior normal.        Thought Content: Thought content normal. Thought content is not paranoid or delusional. Thought content does not include homicidal or suicidal ideation. Thought content does not include homicidal or suicidal plan.        Cognition and Memory: Cognition and memory normal.        Judgment: Judgment normal.     Comments: Insight intact     Lab Review:     Component Value Date/Time   NA 139 03/24/2023 0807   NA 142 06/01/2022 1000   K 3.6 03/24/2023 0807   CL 103 03/24/2023 0807   CO2 27 03/24/2023 0807   GLUCOSE 111 (H) 03/24/2023 0807   BUN 24 (H) 03/24/2023 0807   BUN 21 06/01/2022 1000   CREATININE 1.12 (H) 03/24/2023 0807   CREATININE 0.82 12/28/2015 1000   CALCIUM 8.6 (L) 03/24/2023 0807   PROT 6.1 (L) 03/24/2023 0807   PROT 7.1 05/27/2018 1125   ALBUMIN 2.9 (L) 03/24/2023 0807   ALBUMIN 4.5 05/27/2018 1125   AST 46 (H) 03/24/2023 0807   ALT 36 03/24/2023 0807   ALKPHOS 72 03/24/2023 0807   BILITOT 0.8 03/24/2023 0807   BILITOT 0.4 05/27/2018 1125   GFRNONAA 51 (L) 03/24/2023 0807   GFRAA 82 02/05/2020 1120       Component Value Date/Time   WBC 9.7 03/27/2023 0626   RBC 4.05 03/27/2023 0626   HGB 12.3 03/27/2023 0626   HGB 11.7 09/28/2020 1017   HCT 37.0 03/27/2023 0626   HCT 34.6 09/28/2020 1017   PLT 299 03/27/2023 0626   PLT  207 09/28/2020 1017   MCV 91.4 03/27/2023 0626   MCV 92 09/28/2020 1017   MCH 30.4 03/27/2023 0626   MCHC 33.2 03/27/2023 0626   RDW 13.1 03/27/2023 0626   RDW 12.6 09/28/2020 1017   LYMPHSABS 1.0 03/24/2023 0807   LYMPHSABS 0.9 09/28/2020 1017   MONOABS 1.4 (H) 03/24/2023 0807   EOSABS 0.6 (H) 03/24/2023 0807   EOSABS 0.2 09/28/2020 1017   BASOSABS 0.0 03/24/2023 0807   BASOSABS 0.0 09/28/2020 1017    No results found for: "POCLITH", "LITHIUM"   No results found for: "PHENYTOIN", "PHENOBARB", "VALPROATE", "CBMZ"   .res Assessment: Plan:    Plan:  PDMP reviewed  Trazadone 50mg  for sleep Pristiq 100mg  daily  Wellbutrin XL 150mg  every morning. Denies seizure history.  Increase Abilify 4mg  daily to 5mg  daily - may increase to 6mg  twice daily.  RTC 4 weeks  Patient advised to contact office with any questions, adverse effects, or acute worsening in signs and symptoms.  Discussed potential metabolic side effects associated with atypical antipsychotics, as well as potential risk for movement side effects. Advised pt to contact office if movement side effects occur.    There are no diagnoses linked to this encounter.   Please see After Visit Summary for patient specific instructions.  Future Appointments  Date Time Provider Department Center  04/25/2023 10:30 AM [facility] First, Thereasa Solo, NP [facility] None  05/21/2023  9:45 AM [facility] None  05/24/2023 10:10 AM [facility]  06/04/2023  8:40 AM Croitoru, Mihai, MD [facility] None    No orders of the defined types were placed in this encounter.   -------------------------------

## 2023-05-21 ENCOUNTER — Ambulatory Visit: Payer: Medicare Other

## 2023-05-21 ENCOUNTER — Telehealth: Payer: Self-pay | Admitting: *Deleted

## 2023-05-21 NOTE — Telephone Encounter (Signed)
Called pt since she missed her appointment today at 945am. She stated she forgot and she has gout pain. She asked to be rescheduled until next week and confirmed appt.

## 2023-05-23 ENCOUNTER — Telehealth: Payer: Medicare Other | Admitting: Adult Health

## 2023-05-24 ENCOUNTER — Ambulatory Visit
Admission: RE | Admit: 2023-05-24 | Discharge: 2023-05-24 | Disposition: A | Discharge status: Home / Self Care | Payer: Medicare Other | Source: Ambulatory Visit | Attending: Family Medicine | Admitting: Family Medicine

## 2023-05-24 DIAGNOSIS — Z0000 Encounter for general adult medical examination without abnormal findings: Secondary | ICD-10-CM

## 2023-05-28 ENCOUNTER — Ambulatory Visit: Payer: Medicare Other | Attending: Cardiovascular Disease

## 2023-05-28 DIAGNOSIS — I480 Paroxysmal atrial fibrillation: Secondary | ICD-10-CM

## 2023-05-28 DIAGNOSIS — Z5181 Encounter for therapeutic drug level monitoring: Secondary | ICD-10-CM

## 2023-05-28 DIAGNOSIS — Z7901 Long term (current) use of anticoagulants: Secondary | ICD-10-CM | POA: Diagnosis not present

## 2023-05-28 LAB — POCT INR: INR: 2.8 (ref 2.0–3.0)

## 2023-05-28 NOTE — Patient Instructions (Signed)
Continue taking Warfarin 1 tablet daily.  Eat greens tonight. Recheck INR in 4 weeks.   Coumadin Clinic [phone]

## 2023-06-02 ENCOUNTER — Other Ambulatory Visit: Payer: Self-pay | Admitting: Cardiovascular Disease

## 2023-06-02 DIAGNOSIS — I480 Paroxysmal atrial fibrillation: Secondary | ICD-10-CM

## 2023-06-04 ENCOUNTER — Encounter: Payer: Self-pay | Admitting: Cardiovascular Disease

## 2023-06-04 ENCOUNTER — Ambulatory Visit: Payer: Medicare Other | Attending: Cardiovascular Disease | Admitting: Cardiovascular Disease

## 2023-06-04 VITALS — BP 98/58 | HR 74 | Ht 64.5 in | Wt 185.2 lb

## 2023-06-04 DIAGNOSIS — I5032 Chronic diastolic (congestive) heart failure: Secondary | ICD-10-CM

## 2023-06-04 DIAGNOSIS — I77810 Thoracic aortic ectasia: Secondary | ICD-10-CM

## 2023-06-04 DIAGNOSIS — E7800 Pure hypercholesterolemia, unspecified: Secondary | ICD-10-CM

## 2023-06-04 DIAGNOSIS — I340 Nonrheumatic mitral (valve) insufficiency: Secondary | ICD-10-CM

## 2023-06-04 DIAGNOSIS — J984 Other disorders of lung: Secondary | ICD-10-CM

## 2023-06-04 DIAGNOSIS — I700 Atherosclerosis of aorta: Secondary | ICD-10-CM

## 2023-06-04 DIAGNOSIS — D6869 Other thrombophilia: Secondary | ICD-10-CM

## 2023-06-04 DIAGNOSIS — G4733 Obstructive sleep apnea (adult) (pediatric): Secondary | ICD-10-CM

## 2023-06-04 DIAGNOSIS — Z5181 Encounter for therapeutic drug level monitoring: Secondary | ICD-10-CM | POA: Diagnosis not present

## 2023-06-04 DIAGNOSIS — I4821 Permanent atrial fibrillation: Secondary | ICD-10-CM | POA: Diagnosis not present

## 2023-06-04 DIAGNOSIS — Z79899 Other long term (current) drug therapy: Secondary | ICD-10-CM

## 2023-06-04 DIAGNOSIS — Z952 Presence of prosthetic heart valve: Secondary | ICD-10-CM

## 2023-06-04 NOTE — Progress Notes (Signed)
 Cardiology Office Note:    Date:  06/04/2023   ID:  [name], DOB June 18, 1945, MRN [id number]  PCP:  Sabino Dick, DO  [facility] Cardiologist:  Jazlynn Nemetz [facility] Electrophysiologist:  None   Referring MD: No ref. provider found   No chief complaint on file.    History of Present Illness:    [name] is a 78 y.o. 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  She has not had any cardiovascular problems, but was hospitalized in January for acute respiratory failure with hypoxia and wheezing, due to pneumonia.  She feels she has recovered well from that.  She did not have problems with rapid ventricular rates during that hospitalization.  She she complains of feeling "tired all the time" but can still perform all her usual household chores and activities without difficulty.  She wakes up feeling tired.  She is using CPAP conscientiously and is due for a follow-up visit with her sleep specialist.  She has had occasional epistaxis about once every 3 months, but has not had any other bleeding problems denies syncope or falls.  Does not feel dizzy.  She does not have chest pain at rest or with activity.  She has managed to lose another few pounds, now down 35 pounds from her peak of 220 pounds, but not yet at goal.  BMI remains in the mildly obese range at  31.  We did not perform an ECG today, but she is clearly in atrial fibrillation and has a well-controlled ventricular rate in the 70s.  Her digoxin level has been very stable at 0.8-0.9, most recently checked about 3 months ago.  With the exception of a brief drop in INR in December, her anticoagulation level has almost always been in therapeutic range. Her most recent echocardiogram performed in October 2024 continues to show normal left ventricular size and systolic function, normal gradients across her mechanical aortic valve prosthesis, central mitral insufficiency that in my opinion is moderate-severe, probably atrial functional MR, unchanged PA pressure.  Previously poorly tolerant of metoprolol, which seemed to cause depression, so used carvedilol.    She was most recently hospitalized in June 2021 with acute heart failure exacerbation and her echocardiogram showed decreased LVEF to 30-35% as well as moderately reduced right ventricular systolic function and severely elevated pulmonary artery hypertension, despite normal function of the aortic valve prosthesis.  The cause of LVEF decline was uncertain, but she responded well to discontinuation of diltiazem and treatment with diuretics, Entresto, carvedilol (the doses have been limited by low blood pressure).  In the past she reportedly did not tolerate beta-blockers due to depression, but the current low-dose of carvedilol seems to be working well for her. Follow-up echocardiogram performed in October 2021 showed only partial improvement in LVEF now up to 40-45% in the left ventricle remain dilated with an end-systolic diameter of 49 mm.  Mitral regurgitation remains severe with a central jet.  She underwent a TEE on 07/12/2020 that shows further improvement in LVEF  which is now almost normal at 50-55%.  It also showed that the mitral regurgitation is central and squarely in the moderate to severe range (there was no reversal of flow in the pulmonary  veins, all calculated parameters suggested higher and of moderate MR).  Anatomically, the valve appears appropriate for MitraClip, without major leaflet retraction and with a mean gradient of 3 mmHg at a heart rate around 100 bpm.  She underwent right and left heart catheterization on 10/14/2020.  This showed excellent hemodynamic values and normal pulmonary artery pressure.  The PAWP was only 10 mmHg and the V wave was not prominent.  It appeared that she did not require treatment for the mitral insufficiency at that point.  Follow-up transthoracic echocardiogram performed 02/07/2021 shows complete normalization of LVEF to 55%, moderate to severe mitral regurgitation, normal parameters of the mechanical aortic valve prosthesis with a mean gradient of 10 mmHg.  Pulmonary function test performed in May 2021 were abnormal with restrictive findings: FEV1 of 39% of predicted, commensurate FVC 40% of predicted.  Her pulmonary specialist is Dr. Wynona Neat. She reports compliance with CPAP.    Past Medical History:  Diagnosis Date   Anxiety    Aortic stenosis    status post aortic valve replacement with St. 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   Depression    DJD (degenerative joint disease) of knee    Dyslipidemia    GERD (gastroesophageal reflux disease)    "several years ago; none since" (11/12/2012)   Hyperlipidemia    Hyperlipidemia LDL goal <70 01/19/2017   Hypertension    Left atrial enlargement    Mitral regurgitation 06/25/2016   Mild moderate MR by echo 06/2016   Obesity    Persistent atrial fibrillation (HCC)    s/p afib ablation x 2 at [facility]   Sleep apnea    On CPAP at 12cm H2O   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 - now cleared by neurosurgery to maintain INR  2-2.5    Past Surgical History:  Procedure Laterality Date   BURR HOLE FOR SUBDURAL HEMATOMA  2006   CARDIAC VALVE REPLACEMENT  2006   St. Jude AVR   CARDIOVERSION N/A 07/22/2012   Procedure: CARDIOVERSION;  Surgeon: Donato Schultz, MD;  Location: [facility] ENDOSCOPY;  Service: Cardiovascular;  Laterality: N/A;   CARDIOVERSION N/A 11/25/2012   Procedure: CARDIOVERSION;  Surgeon: Quintella Reichert, MD;  Location: [facility] ENDOSCOPY;  Service: Cardiovascular;  Laterality: N/A;   CARDIOVERSION N/A 12/30/2012   Procedure: CARDIOVERSION;  Surgeon: Quintella Reichert, MD;  Location: [facility] ENDOSCOPY;  Service: Cardiovascular;  Laterality: N/A;  h&p in file-HW    CARDIOVERSION N/A 03/30/2013   Procedure: CARDIOVERSION;  Surgeon: Quintella Reichert, MD;  Location: [facility] ENDOSCOPY;  Service: Cardiovascular;  Laterality: N/A;   ELECTROPHYSIOLOGIC STUDY  11/2013   Afib ablation x 2 (11/2013 and 10/2015) at [facility] by Dr Smith Robert with recurrence post ablation   KNEE ARTHROSCOPY Left 1980's   "2" (11/12/2012)   KNEE SURGERY Left 1980's   "after 2 scopes they went in and did some kind of OR" (11/12/2012)   RIGHT/LEFT HEART CATH AND CORONARY ANGIOGRAPHY N/A 10/14/2020   Procedure: RIGHT/LEFT HEART CATH AND CORONARY ANGIOGRAPHY;  Surgeon: [place], Peter M, MD;  Location: [facility] INVASIVE CV LAB;  Service: Cardiovascular;  Laterality: N/A;   TEE WITHOUT CARDIOVERSION N/A 07/22/2012   Procedure: TRANSESOPHAGEAL ECHOCARDIOGRAM (TEE);  Surgeon: Donato Schultz, MD;  Location: [facility] ENDOSCOPY;  Service: Cardiovascular;  Laterality: N/A;  Rm 2034   TEE WITHOUT CARDIOVERSION N/A 10/07/2013   Procedure: TRANSESOPHAGEAL ECHOCARDIOGRAM (TEE);  Surgeon: Donato Schultz, MD;  Location: [facility] ENDOSCOPY;  Service: Cardiovascular;  Laterality: N/A;   TEE WITHOUT CARDIOVERSION N/A 07/12/2020   Procedure: TRANSESOPHAGEAL ECHOCARDIOGRAM (TEE);  Surgeon: Thurmon Fair, MD;  Location: [facility] ENDOSCOPY;  Service: Cardiovascular;  Laterality: N/A;   TOTAL KNEE ARTHROPLASTY Left 11/05/2014    Procedure: TOTAL KNEE ARTHROPLASTY;  Surgeon: Jodi Geralds, MD;  Location: [facility] OR;  Service: Orthopedics;  Laterality: Left;   TUBAL LIGATION  1980's    Current Medications: Current Meds  Medication Sig   acetaminophen (TYLENOL) 500 MG tablet Take 500 mg by mouth as needed.   ARIPiprazole (ABILIFY) 5 MG tablet Take 1 tablet (5 mg total) by mouth daily.   buPROPion (WELLBUTRIN XL) 150 MG 24 hr tablet Take 1 tablet (150 mg total) by mouth daily.   calcium-vitamin D (OSCAL WITH D) 500-200 MG-UNIT per tablet Take 2 tablets by mouth in the morning.   carvedilol (COREG) 3.125 MG tablet Take 1 tablet (3.125 mg total) by mouth 2 (two) times daily with a meal.   Cholecalciferol (VITAMIN D) 2000 UNITS tablet Take 4,000 Units by mouth in the morning.   desvenlafaxine (PRISTIQ) 100 MG 24 hr tablet Take 1 tablet (100 mg total) by mouth daily.   digoxin (LANOXIN) 0.125 MG tablet Take 1 tablet (0.125 mg total) by mouth daily.   famotidine (PEPCID) 10 MG tablet Take 10 mg by mouth as needed for heartburn or indigestion.   ferrous sulfate 325 (65 FE) MG tablet Take 325 mg by mouth in the morning.   furosemide (LASIX) 80 MG tablet Take 1 tablet by mouth daily. OKAY TO TAKE EXTRA FOR WEIGHT GAIN> 3LB/ DAILY OR 5LB/WEEKLY   levothyroxine (SYNTHROID, LEVOTHROID) 100 MCG tablet Take 100 mcg daily before breakfast by mouth.   Multiple Vitamin (MULTIVITAMIN WITH MINERALS) TABS Take 1 tablet by mouth in the morning.   oxymetazoline (AFRIN) 0.05 % nasal spray Place 1 spray into both nostrils daily as needed for congestion.   potassium chloride SA (KLOR-CON M) 20 MEQ tablet TAKE 2 TABLETS(40 MEQ) BY MOUTH DAILY   rosuvastatin (CRESTOR) 20 MG tablet TAKE 1 TABLET BY MOUTH EVERY DAY   sacubitril-valsartan (ENTRESTO) 97-103 MG TAKE 1 TABLET BY MOUTH TWICE A DAY   traZODone (DESYREL) 50 MG tablet Take 1 tablet (50 mg total) by mouth at bedtime as needed.   tretinoin (RETIN-A) 0.05 % cream Apply 1 application topically at  bedtime. Applied to face   warfarin (COUMADIN) 4 MG tablet TAKE 1 TABLET BY MOUTH EVERY DAY AS DIRECTED BY COUMADIN CLINIC     Allergies:   Codeine, Dilantin [phenytoin sodium extended], Metoprolol, and Propoxyphene   Social History   Socioeconomic History   Marital status: Married    Spouse name: Not on file   Number of children: Not on file   Years of education: Not on file   Highest education level: Not on file  Occupational History   Not on file  Tobacco Use   Smoking status: Never   Smokeless tobacco: Never   Tobacco comments:    Never smoke 06/22/21  Substance and Sexual Activity   Alcohol use: No    Comment: 11/12/2012 "no  alcohol in the last 9 years"   Drug use: No   Sexual activity: Not Currently  Other Topics Concern   Not on file  Social History Narrative   Pt lives in [place] with spouse.  Retired   Chief Executive Officer Drivers of Corporate investment banker Strain: Not on [employer] Insecurity: No Food Insecurity (03/23/2023)   Hunger Vital Sign    Worried About Running Out of Food in the Last Year: Never true    Ran Out of Food in the Last Year: Never true  Transportation Needs: No Transportation Needs (03/23/2023)   PRAPARE - Administrator, Civil Service (Medical): No    Lack of Transportation (Non-Medical): No  Physical Activity: Inactive (09/30/2019)   Exercise Vital Sign    Days of Exercise per Week: 0 days    Minutes of Exercise per Session: 0 min  Stress: Not on file  Social Connections: Unknown (09/30/2019)   Social Connection and Isolation Panel [NHANES]    Frequency of Communication with Friends and Family: Once a week    Frequency of Social Gatherings with Friends and Family: Not on file    Attends Religious Services: 1 to 4 times per year    Active Member of [employer] or Organizations: No    Attends Banker Meetings: Never    Marital Status: Married     Family History: The patient's family history includes Breast cancer (age of  onset: 52) in her sister; Heart disease in her brother; Hyperlipidemia in her father; Hypertension in her father and mother; Lung cancer in her mother. There is no history of BRCA 1/2.  ROS:   Please see the history of present illness.     All other systems reviewed and are negative.  EKGs/Labs/Other Studies Reviewed:    The following studies were reviewed today: Transthoracic echo 02/01/2023   1. Left ventricular ejection fraction, by estimation, is 55%. The left  ventricle has normal function. The left ventricle has no regional wall  motion abnormalities. Left ventricular diastolic parameters are  indeterminate.   2. Right ventricular systolic function is mildly reduced. The right  ventricular size is normal. There is mildly elevated pulmonary artery  systolic pressure. The estimated right ventricular systolic pressure is  38.0 mmHg.   3. Left atrial size was severely dilated.   4. Right atrial size was severely dilated.   5. The mitral valve is degenerative. Severe mitral valve regurgitation,  possible atrial functional MR. Moderate mitral annular calcification.   6. There is a Futures trader aortic valve. Mean gradient 8 mmHg, no  significant perivalvular leakage.   7. Aortic dilatation noted. There is mild dilatation of the aortic root,  measuring 38 mm. There is moderate dilatation of the ascending aorta,  measuring 44 mm.   8. The inferior vena cava is normal in size with greater than 50%  respiratory variability, suggesting right atrial pressure of 3 mmHg.   9. The patient was in atrial fibrillation.   EKG: Ordered today shows atrial fibrillation with controlled ventricular response, RBBB (Chronic)  Recent Labs: 03/23/2023: B Natriuretic Peptide 205.5 03/24/2023: ALT 36; BUN 24; Creatinine, Ser 1.12; Magnesium 2.1; Potassium 3.6; Sodium 139 03/27/2023: Hemoglobin 12.3; Platelets 299    Recent Lipid Panel    Component Value Date/Time   CHOL 170 05/24/2021 1054    TRIG 94 05/24/2021 1054   HDL 64 05/24/2021 1054   CHOLHDL 2.7 05/24/2021 1054   LDLCALC 89 05/24/2021 1054  01/30/2023 Cholesterol 146, HDL 41, LDL 78, triglycerides 150 Hemoglobin 13, potassium 4.0, ALT 14, TSH 2.83 (most recent creatinine 1.0 from 06/01/2022)  Physical Exam:    VS:  BP (!) 98/58 (BP Location: Left Arm, Patient Position: Sitting, Cuff Size: Large)   Pulse 74   Ht 5' 4.5" (1.638 m)   Wt 185 lb 3.2 oz (84 kg)   SpO2 98%   BMI 31.30 kg/m     Wt Readings from Last 3 Encounters:  06/04/23 185 lb 3.2 oz (84 kg)  03/26/23 189 lb 2.5 oz (85.8 kg)  02/04/23 189 lb (85.7 kg)      General: Alert, oriented x3, no distress, mildly obese Head: no evidence of trauma, PERRL, EOMI, no exophtalmos or lid lag, no myxedema, no xanthelasma; normal ears, nose and oropharynx Neck: normal jugular venous pulsations and no hepatojugular reflux; brisk carotid pulses without delay and no carotid bruits Chest: clear to auscultation, no signs of consolidation by percussion or palpation, normal fremitus, symmetrical and full respiratory excursions Cardiovascular: normal position and quality of the apical impulse, regular rhythm, mechanical valve clicks, 1/6 aortic ejection murmur is early peaking, I do not hear an apical systolic murmur today, no diastolic murmurs, rubs or gallops Abdomen: no tenderness or distention, no masses by palpation, no abnormal pulsatility or arterial bruits, normal bowel sounds, no hepatosplenomegaly Extremities: no clubbing, cyanosis or edema; 2+ radial, ulnar and brachial pulses bilaterally; 2+ right femoral, posterior tibial and dorsalis pedis pulses; 2+ left femoral, posterior tibial and dorsalis pedis pulses; no subclavian or femoral bruits Neurological: grossly nonfocal Psych: Normal mood and affect    ASSESSMENT:    1. Chronic diastolic CHF (congestive heart failure) (HCC)   2. Non-rheumatic mitral regurgitation   3. Permanent atrial fibrillation  (HCC)   4. Encounter for monitoring digoxin therapy   5. H/O mechanical aortic valve replacement   6. Acquired thrombophilia (HCC)   7. OSA on CPAP   8. Chronic restrictive lung disease   9. Ascending aorta dilatation (HCC)   10. Atherosclerosis of aorta (HCC)   11. Hypercholesterolemia           PLAN:    In order of problems listed above:  CHF: NYHA functional class I-2, clinically euvolemic without any recent adjustment in her loop diuretic dose.  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   She does not have any signs or symptoms of coronary disease or any perfusion abnormalities on the nuclear stress test from May 2021.   MR: Symptoms unchanged.  I cannot hear the murmur today, the MR jet is central is associated with pulmonary vein systolic forward flow on echo..  At cardiac catheterization LV filling pressures were very low.  Anatomically, she does have appropriate findings for MitraClip should this become necessary in the future.  Continue yearly follow-up clinically and with echo to screen for signs of LV dilation or decrease in EF. AFib: Permanent arrhythmia with 2 previous ablation procedures and failure on amiodarone and dofetilide.  Well rate-controlled and fully anticoagulated.  Often has a relatively regular rhythm suggesting a competing junctional pacemaker.   Digoxin: No symptoms of toxicity.  She did not do well with higher doses of carvedilol or other beta-blockers for rate control and seems to feel better on digoxin. Mech AVR: Normal prosthetic valve function by echo in October.  (mean gradient 7 mmHg for size 19 St. Jude [employer] dual leaflet).   Anticoagulation: Therapeutic INR.  She  does have a history of serious bleeding complications on 2 occasions (subdural hematoma 2006 and rectus sheath tumor 2019).  Occasional epistaxis but otherwise no serious bleeding problems since then. OSA: Complains of fatigue and  feels tired upon awakening.  Asked her to discuss this with her sleep specialist at her upcoming appointment.  May need adjustment in CPAP settings. Restrictive lung disease: No change in dyspnea.  PFTs from 2018 through 2021 showed reductions in FVC of 40-56% of predicted.  This may be a significant part of her chronic dyspnea.  Fairly significant reduction in FVC, at least in part attributable to obesity.  Also has moderate thoracic dextroscoliosis.  Follow-up with Dr. Wynona Neat. Asc Ao dilation: Asymptomatic.  Will follow this with echo since the measurements match pretty well with CT and just get a CT every 3-5 years.  Stable in size by CT and by recent echo (by echo 4.4 cm, by. CT scan in 2017 4.3 cm, CT  07/13/2021 shows exactly the same measurement of 4.3 cm).   Aortic atherosclerosis: Mild atherosclerosis of the thoracic aorta on the most recent CT April 2023, and abdominal aortic atherosclerosis is described on CT from 2019.   HLP: LDL in target range.  Continue statin. Depression: Also be responsible for fatigue, but but does not have other symptoms that suggest this is currently decompensated.  Medication Adjustments/Labs and Tests Ordered: Current medicines are reviewed at length with the patient today.  Concerns regarding medicines are outlined above.  Orders Placed This Encounter  Procedures   ECHOCARDIOGRAM COMPLETE     No orders of the defined types were placed in this encounter.    Patient Instructions  Medication Instructions:  No Changes *If you need a refill on your cardiac medications before your next appointment, please call your pharmacy*   Lab Work: No Labs If you have labs (blood work) drawn today and your tests are completely normal, you will receive your results only by: MyChart Message (if you have MyChart) OR A paper copy in the mail If you have any lab test that is abnormal or we need to change your treatment, we will call you to review the  results.   Testing/Procedures: [address], [address] (October 2025) Your physician has requested that you have an echocardiogram. Echocardiography is a painless test that uses sound waves to create images of your heart. It provides your doctor with information about the size and shape of your heart and how well your heart's chambers and valves are working. This procedure takes approximately one hour. There are no restrictions for this procedure. Please do NOT wear cologne, perfume, aftershave, or lotions (deodorant is allowed). Please arrive 15 minutes prior to your appointment time.  Please note: We ask at that you not bring children with you during ultrasound (echo/ vascular) testing. Due to room size and safety concerns, children are not allowed in the ultrasound rooms during exams. Our front office staff cannot provide observation of children in our lobby area while testing is being conducted. An adult accompanying a patient to their appointment will only be allowed in the ultrasound room at the discretion of the ultrasound technician under special circumstances. We apologize for any inconvenience.    Follow-Up: At [facility], you and your health needs are our priority.  As part of our continuing mission to provide you with exceptional heart care, we have created designated Provider Care Teams.  These Care Teams include your primary Cardiologist (physician) and Advanced Practice Providers (  APPs -  Physician Assistants and Nurse Practitioners) who all work together to provide you with the care you need, when you need it.  We recommend signing up for the patient portal called "MyChart".  Sign up information is provided on this After Visit Summary.  MyChart is used to connect with patients for Virtual Visits (Telemedicine).  Patients are able to view lab/test results, encounter notes, upcoming appointments, etc.  Non-urgent messages can be sent to your provider as well.   To  learn more about what you can do with MyChart, go to [email or web address].    Your next appointment:   1 year(s)  Provider:   Thurmon Fair, MD     Signed, Thurmon Fair, MD  06/04/2023 10:12 AM    [facility]

## 2023-06-04 NOTE — Patient Instructions (Signed)
 Medication Instructions:  No Changes *If you need a refill on your cardiac medications before your next appointment, please call your pharmacy*   Lab Work: No Labs If you have labs (blood work) drawn today and your tests are completely normal, you will receive your results only by: MyChart Message (if you have MyChart) OR A paper copy in the mail If you have any lab test that is abnormal or we need to change your treatment, we will call you to review the results.   Testing/Procedures: [address] (October 2025) Your physician has requested that you have an echocardiogram. Echocardiography is a painless test that uses sound waves to create images of your heart. It provides your doctor with information about the size and shape of your heart and how well your heart's chambers and valves are working. This procedure takes approximately one hour. There are no restrictions for this procedure. Please do NOT wear cologne, perfume, aftershave, or lotions (deodorant is allowed). Please arrive 15 minutes prior to your appointment time.  Please note: We ask at that you not bring children with you during ultrasound (echo/ vascular) testing. Due to room size and safety concerns, children are not allowed in the ultrasound rooms during exams. Our front office staff cannot provide observation of children in our lobby area while testing is being conducted. An adult accompanying a patient to their appointment will only be allowed in the ultrasound room at the discretion of the ultrasound technician under special circumstances. We apologize for any inconvenience.    Follow-Up: At [facility], you and your health needs are our priority.  As part of our continuing mission to provide you with exceptional heart care, we have created designated Provider Care Teams.  These Care Teams include your primary Cardiologist (physician) and Advanced Practice Providers (APPs -  Physician  Assistants and Nurse Practitioners) who all work together to provide you with the care you need, when you need it.  We recommend signing up for the patient portal called "MyChart".  Sign up information is provided on this After Visit Summary.  MyChart is used to connect with patients for Virtual Visits (Telemedicine).  Patients are able to view lab/test results, encounter notes, upcoming appointments, etc.  Non-urgent messages can be sent to your provider as well.   To learn more about what you can do with MyChart, go to [email or web address].    Your next appointment:   1 year(s)  Provider:   Thurmon Fair, MD

## 2023-06-06 ENCOUNTER — Encounter: Payer: Self-pay | Admitting: Adult Health

## 2023-06-06 ENCOUNTER — Telehealth: Payer: Medicare Other | Admitting: Adult Health

## 2023-06-06 DIAGNOSIS — G4700 Insomnia, unspecified: Secondary | ICD-10-CM

## 2023-06-06 DIAGNOSIS — F331 Major depressive disorder, recurrent, moderate: Secondary | ICD-10-CM | POA: Diagnosis not present

## 2023-06-06 MED ORDER — ARIPIPRAZOLE 5 MG PO TABS: ORAL_TABLET | ORAL | 1 refills | Status: DC

## 2023-06-06 MED ORDER — DESVENLAFAXINE SUCCINATE ER 100 MG PO TB24: 100.0000 mg | ORAL_TABLET | Freq: Every day | ORAL | 3 refills | Status: AC

## 2023-06-06 MED ORDER — TRAZODONE HCL 50 MG PO TABS: 50.0000 mg | ORAL_TABLET | Freq: Every evening | ORAL | 3 refills | Status: AC | PRN

## 2023-06-06 NOTE — Progress Notes (Signed)
 [name] [id number] Aug 28, 1945 78 y.o.  Virtual Visit via Video Note  I connected with pt @ on 06/06/23 at 11:00 AM EST by a video enabled telemedicine application and verified that I am speaking with the correct person using two identifiers.   I discussed the limitations of evaluation and management by telemedicine and the availability of in person appointments. The patient expressed understanding and agreed to proceed.  I discussed the assessment and treatment plan with the patient. The patient was provided an opportunity to ask questions and all were answered. The patient agreed with the plan and demonstrated an understanding of the instructions.   The patient was advised to call back or seek an in-person evaluation if the symptoms worsen or if the condition fails to improve as anticipated.  I provided 15 minutes of non-face-to-face time during this encounter.  The patient was located at home.  The provider was located at [facility].  Dorothyann Gibbs, NP   Subjective:   Patient ID:  [name] is a 78 y.o. (DOB June 11, 1945) female.  Chief Complaint: No chief complaint on file.   HPI [name] presents for follow-up of MDD.  Describes mood today as "ok". Pleasant. Reports decreased tearfulness. Mood symptoms - reports decreased depression. Reports improved interest and motivation.Reports irritability at times. Denies anxiety. Denies panic attacks. Denies worry, rumination, and over thinking. Reports mood has improved. Stating "I feel like I'm doing better". Feels like the addition of Abilify has been helpful to manage mood instability.  Taking medications as prescribed.  Energy levels stable. Active, trying to exercise more. Enjoys some usual interests and activities. Married. Lives with husband and 2 [place] terriers. Has a son, age 23. Has one step daughter - 73. Spending time with family. Attends church.  Appetite adequate. Weight loss during  recent illness - 181 pounds. Sleeps well most nights. Averages 8 to 9 hours. Denies occasional daytime napping.   Focus and concentration stable. Completing tasks. Managing aspects of household. Retired - [employer]. Denies SI or HI.  Denies AH or VH. Denies self harm. Denies substance use.  Previous medication trials:  Xanax, Zoloft, Wellbutrin, Valium, Ambien, Lexapro   Review of Systems:  Review of Systems  Musculoskeletal:  Negative for gait problem.  Neurological:  Negative for tremors.  Psychiatric/Behavioral:         Please refer to HPI    Medications: I have reviewed the patient's current medications.  Current Outpatient Medications  Medication Sig Dispense Refill   acetaminophen (TYLENOL) 500 MG tablet Take 500 mg by mouth as needed.     ARIPiprazole (ABILIFY) 5 MG tablet Take 1 tablet (5 mg total) by mouth daily. 30 tablet 2   buPROPion (WELLBUTRIN XL) 150 MG 24 hr tablet Take 1 tablet (150 mg total) by mouth daily. 90 tablet 3   calcium-vitamin D (OSCAL WITH D) 500-200 MG-UNIT per tablet Take 2 tablets by mouth in the morning.     carvedilol (COREG) 3.125 MG tablet Take 1 tablet (3.125 mg total) by mouth 2 (two) times daily with a meal. 180 tablet 1   Cholecalciferol (VITAMIN D) 2000 UNITS tablet Take 4,000 Units by mouth in the morning.     desvenlafaxine (PRISTIQ) 100 MG 24 hr tablet Take 1 tablet (100 mg total) by mouth daily. 90 tablet 3   digoxin (LANOXIN) 0.125 MG tablet Take 1 tablet (0.125 mg total) by mouth daily. 90 tablet 1   famotidine (PEPCID) 10 MG tablet Take 10  mg by mouth as needed for heartburn or indigestion.     ferrous sulfate 325 (65 FE) MG tablet Take 325 mg by mouth in the morning.     furosemide (LASIX) 80 MG tablet Take 1 tablet by mouth daily. OKAY TO TAKE EXTRA FOR WEIGHT GAIN> 3LB/ DAILY OR 5LB/WEEKLY 180 tablet 3   levothyroxine (SYNTHROID, LEVOTHROID) 100 MCG tablet Take 100 mcg daily before breakfast by mouth.     Multiple Vitamin  (MULTIVITAMIN WITH MINERALS) TABS Take 1 tablet by mouth in the morning.     oxymetazoline (AFRIN) 0.05 % nasal spray Place 1 spray into both nostrils daily as needed for congestion.     potassium chloride SA (KLOR-CON M) 20 MEQ tablet TAKE 2 TABLETS(40 MEQ) BY MOUTH DAILY 180 tablet 2   rosuvastatin (CRESTOR) 20 MG tablet TAKE 1 TABLET BY MOUTH EVERY DAY 90 tablet 3   sacubitril-valsartan (ENTRESTO) 97-103 MG TAKE 1 TABLET BY MOUTH TWICE A DAY 180 tablet 3   traZODone (DESYREL) 50 MG tablet Take 1 tablet (50 mg total) by mouth at bedtime as needed. 90 tablet 3   tretinoin (RETIN-A) 0.05 % cream Apply 1 application topically at bedtime. Applied to face     warfarin (COUMADIN) 4 MG tablet TAKE 1 TABLET BY MOUTH EVERY DAY AS DIRECTED BY COUMADIN CLINIC 90 tablet 0   No current facility-administered medications for this visit.    Medication Side Effects: None  Allergies:  Allergies  Allergen Reactions   Codeine Anaphylaxis    Jerking, involuntary jerking    Dilantin [Phenytoin Sodium Extended] Rash and Itching   Metoprolol Other (See Comments)    depression    Propoxyphene Other (See Comments)    Bad vivid dreams    Past Medical History:  Diagnosis Date   Anxiety    Aortic stenosis    status post aortic valve replacement with St. 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   Depression    DJD (degenerative joint disease) of knee    Dyslipidemia    GERD (gastroesophageal reflux disease)    "several years ago; none since" (11/12/2012)   Hyperlipidemia    Hyperlipidemia LDL goal <70 01/19/2017   Hypertension    Left atrial enlargement    Mitral regurgitation 06/25/2016   Mild moderate MR by echo 06/2016   Obesity    Persistent atrial fibrillation (HCC)    s/p afib ablation x 2 at [facility]   Sleep apnea     On CPAP at 12cm H2O   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 - now cleared by neurosurgery to maintain INR 2-2.5    Family History  Problem Relation Age of Onset   Lung cancer Mother    Hypertension Mother    Hyperlipidemia Father    Hypertension Father    Breast cancer Sister 75   Heart disease Brother    BRCA 1/2 Neg Hx     Social History   Socioeconomic History   Marital status: Married    Spouse name: Not on file   Number of children: Not on file   Years of education: Not on file   Highest education level: Not on file  Occupational History   Not on file  Tobacco Use  Smoking status: Never   Smokeless tobacco: Never   Tobacco comments:    Never smoke 06/22/21  Substance and Sexual Activity   Alcohol use: No    Comment: 11/12/2012 "no alcohol in the last 9 years"   Drug use: No   Sexual activity: Not Currently  Other Topics Concern   Not on file  Social History Narrative   Pt lives in [place] with spouse.  Retired   Chief Executive Officer Drivers of Corporate investment banker Strain: Not on [employer] Insecurity: No Food Insecurity (03/23/2023)   Hunger Vital Sign    Worried About Running Out of Food in the Last Year: Never true    Ran Out of Food in the Last Year: Never true  Transportation Needs: No Transportation Needs (03/23/2023)   PRAPARE - Administrator, Civil Service (Medical): No    Lack of Transportation (Non-Medical): No  Physical Activity: Inactive (09/30/2019)   Exercise Vital Sign    Days of Exercise per Week: 0 days    Minutes of Exercise per Session: 0 min  Stress: Not on file  Social Connections: Unknown (09/30/2019)   Social Connection and Isolation Panel [NHANES]    Frequency of Communication with Friends and Family: Once a week    Frequency of Social Gatherings with Friends and Family: Not on file    Attends Religious Services: 1 to 4 times per year    Active Member of [employer] or Organizations: No     Attends Banker Meetings: Never    Marital Status: Married  Catering manager Violence: Not At Risk (03/23/2023)   Humiliation, Afraid, Rape, and Kick questionnaire    Fear of Current or Ex-Partner: No    Emotionally Abused: No    Physically Abused: No    Sexually Abused: No    Past Medical History, Surgical history, Social history, and Family history were reviewed and updated as appropriate.   Please see review of systems for further details on the patient's review from today.   Objective:   Physical Exam:  There were no vitals taken for this visit.  Physical Exam Constitutional:      General: She is not in acute distress. Musculoskeletal:        General: No deformity.  Neurological:     Mental Status: She is alert and oriented to person, place, and time.     Coordination: Coordination normal.  Psychiatric:        Attention and Perception: Attention and perception normal. She does not perceive auditory or visual hallucinations.        Mood and Affect: Affect is not labile, blunt, angry or inappropriate.        Speech: Speech normal.        Behavior: Behavior normal.        Thought Content: Thought content normal. Thought content is not paranoid or delusional. Thought content does not include homicidal or suicidal ideation. Thought content does not include homicidal or suicidal plan.        Cognition and Memory: Cognition and memory normal.        Judgment: Judgment normal.     Comments: Insight intact     Lab Review:     Component Value Date/Time   NA 139 03/24/2023 0807   NA 142 06/01/2022 1000   K 3.6 03/24/2023 0807   CL 103 03/24/2023 0807   CO2 27 03/24/2023 0807   GLUCOSE 111 (H) 03/24/2023 0807   BUN 24 (H)  03/24/2023 0807   BUN 21 06/01/2022 1000   CREATININE 1.12 (H) 03/24/2023 0807   CREATININE 0.82 12/28/2015 1000   CALCIUM 8.6 (L) 03/24/2023 0807   PROT 6.1 (L) 03/24/2023 0807   PROT 7.1 05/27/2018 1125   ALBUMIN 2.9 (L) 03/24/2023  0807   ALBUMIN 4.5 05/27/2018 1125   AST 46 (H) 03/24/2023 0807   ALT 36 03/24/2023 0807   ALKPHOS 72 03/24/2023 0807   BILITOT 0.8 03/24/2023 0807   BILITOT 0.4 05/27/2018 1125   GFRNONAA 51 (L) 03/24/2023 0807   GFRAA 82 02/05/2020 1120       Component Value Date/Time   WBC 9.7 03/27/2023 0626   RBC 4.05 03/27/2023 0626   HGB 12.3 03/27/2023 0626   HGB 11.7 09/28/2020 1017   HCT 37.0 03/27/2023 0626   HCT 34.6 09/28/2020 1017   PLT 299 03/27/2023 0626   PLT 207 09/28/2020 1017   MCV 91.4 03/27/2023 0626   MCV 92 09/28/2020 1017   MCH 30.4 03/27/2023 0626   MCHC 33.2 03/27/2023 0626   RDW 13.1 03/27/2023 0626   RDW 12.6 09/28/2020 1017   LYMPHSABS 1.0 03/24/2023 0807   LYMPHSABS 0.9 09/28/2020 1017   MONOABS 1.4 (H) 03/24/2023 0807   EOSABS 0.6 (H) 03/24/2023 0807   EOSABS 0.2 09/28/2020 1017   BASOSABS 0.0 03/24/2023 0807   BASOSABS 0.0 09/28/2020 1017    No results found for: "POCLITH", "LITHIUM"   No results found for: "PHENYTOIN", "PHENOBARB", "VALPROATE", "CBMZ"   .res Assessment: Plan:    Plan:  PDMP reviewed  Trazadone 50mg  for sleep Pristiq 100mg  daily  Wellbutrin XL 150mg  every morning. Denies seizure history. Abilify 2.5mg  daily.  RTC 8 weeks  15 minutes spent dedicated to the care of this patient on the date of this encounter to include pre-visit review of records, ordering of medication, post visit documentation, and face-to-face time with the patient discussing depression. Discussed continuing current medication regimen.  Patient advised to contact office with any questions, adverse effects, or acute worsening in signs and symptoms.  Discussed potential metabolic side effects associated with atypical antipsychotics, as well as potential risk for movement side effects. Advised pt to contact office if movement side effects occur.    There are no diagnoses linked to this encounter.   Please see After Visit Summary for patient specific  instructions.  Future Appointments  Date Time Provider Department Center  06/06/2023 11:00 AM Kingsley Farace, Thereasa Solo, NP [facility] None  06/25/2023 10:15 AM [facility] None  01/29/2024 10:30 AM [id number]    No orders of the defined types were placed in this encounter.     -------------------------------

## 2023-06-07 ENCOUNTER — Other Ambulatory Visit: Payer: Self-pay | Admitting: Cardiovascular Disease

## 2023-06-08 ENCOUNTER — Other Ambulatory Visit: Payer: Self-pay | Admitting: Cardiovascular Disease

## 2023-06-25 ENCOUNTER — Ambulatory Visit: Payer: Medicare Other | Attending: Internal Medicine | Admitting: *Deleted

## 2023-06-25 DIAGNOSIS — Z5181 Encounter for therapeutic drug level monitoring: Secondary | ICD-10-CM | POA: Diagnosis not present

## 2023-06-25 DIAGNOSIS — I480 Paroxysmal atrial fibrillation: Secondary | ICD-10-CM | POA: Diagnosis not present

## 2023-06-25 LAB — POCT INR: INR: 2.6 (ref 2.0–3.0)

## 2023-06-25 NOTE — Patient Instructions (Signed)
 Description   Have a leafy vegetable tonight and continue taking Warfarin 1 tablet daily. Recheck INR in 4 weeks.   [facility] [phone]

## 2023-07-23 ENCOUNTER — Ambulatory Visit: Attending: Cardiology | Admitting: *Deleted

## 2023-07-23 DIAGNOSIS — Z7901 Long term (current) use of anticoagulants: Secondary | ICD-10-CM

## 2023-07-23 DIAGNOSIS — Z5181 Encounter for therapeutic drug level monitoring: Secondary | ICD-10-CM

## 2023-07-23 DIAGNOSIS — I480 Paroxysmal atrial fibrillation: Secondary | ICD-10-CM | POA: Diagnosis not present

## 2023-07-23 LAB — POCT INR: INR: 2.3 (ref 2.0–3.0)

## 2023-07-23 NOTE — Patient Instructions (Signed)
Description   Continue taking Warfarin 1 tablet daily. Recheck INR in 5 weeks.  Coumadin Clinic [phone]     

## 2023-07-30 ENCOUNTER — Encounter: Payer: Self-pay | Admitting: Adult Health

## 2023-07-30 ENCOUNTER — Other Ambulatory Visit: Payer: Self-pay | Admitting: Adult Health

## 2023-07-30 ENCOUNTER — Telehealth: Payer: Medicare Other | Admitting: Adult Health

## 2023-07-30 DIAGNOSIS — F331 Major depressive disorder, recurrent, moderate: Secondary | ICD-10-CM

## 2023-07-30 DIAGNOSIS — G4700 Insomnia, unspecified: Secondary | ICD-10-CM | POA: Diagnosis not present

## 2023-07-30 MED ORDER — ARIPIPRAZOLE 2 MG PO TABS: ORAL_TABLET | ORAL | 2 refills | Status: DC

## 2023-07-30 NOTE — Progress Notes (Signed)
 [name] [id number] November 02, 1945 78 y.o.  Virtual Visit via Video Note  I connected with pt @ on 07/30/23 at  9:30 AM EDT by a video enabled telemedicine application and verified that I am speaking with the correct person using two identifiers.   I discussed the limitations of evaluation and management by telemedicine and the availability of in person appointments. The patient expressed understanding and agreed to proceed.  I discussed the assessment and treatment plan with the patient. The patient was provided an opportunity to ask questions and all were answered. The patient agreed with the plan and demonstrated an understanding of the instructions.   The patient was advised to call back or seek an in-person evaluation if the symptoms worsen or if the condition fails to improve as anticipated.  I provided 20 minutes of non-face-to-face time during this encounter.  The patient was located at home.  The provider was located at [facility].   Reagan Camera, NP   Subjective:   Patient ID:  [name] is a 78 y.o. (DOB 23-Mar-1946) female.  Chief Complaint: No chief complaint on file.   HPI [name] presents for follow-up of MDD and insomnia.  Describes mood today as "ok". Pleasant. Denies tearfulness. Mood symptoms - reports decreased depression - "mostly gone". Reports stable interest and motivation. Denies anxiety and irritability. Denies Denies panic attacks. Denies worry, rumination and over thinking. Reports mood has improved. Stating "I feel like I'm happy and involved". Feels like current medication regimen is helpful to manage mood symptoms. Taking medications as prescribed.  Energy levels stable. Active, trying to exercise more. Enjoys some usual interests and activities. Married. Lives with husband and 2 [place] terriers. Has a son, age 51. Has one step daughter - 58. Spending time with family. Attends church.  Appetite adequate. Weight  loss during recent illness - 181 pounds. Sleeps well most nights. Averages 8 to 9 hours. Denies occasional daytime napping.   Focus and concentration stable. Completing tasks. Managing aspects of household. Retired - [employer]. Denies SI or HI.  Denies AH or VH. Denies self harm. Denies substance use.  Previous medication trials:  Xanax , Zoloft , Wellbutrin , Valium , Ambien , Lexapro   Review of Systems:  Review of Systems  Musculoskeletal:  Negative for gait problem.  Neurological:  Negative for tremors.  Psychiatric/Behavioral:         Please refer to HPI   Medications: I have reviewed the patient's current medications.  Current Outpatient Medications  Medication Sig Dispense Refill   acetaminophen  (TYLENOL ) 500 MG tablet Take 500 mg by mouth as needed.     ARIPiprazole  (ABILIFY ) 5 MG tablet Take 1/2 tablet daily. 45 tablet 1   buPROPion  (WELLBUTRIN  XL) 150 MG 24 hr tablet Take 1 tablet (150 mg total) by mouth daily. 90 tablet 3   calcium -vitamin D  (OSCAL WITH D) 500-200 MG-UNIT per tablet Take 2 tablets by mouth in the morning.     carvedilol  (COREG ) 3.125 MG tablet TAKE 1 TABLET BY MOUTH TWICE A DAY WITH A MEAL 180 tablet 3   Cholecalciferol  (VITAMIN D ) 2000 UNITS tablet Take 4,000 Units by mouth in the morning.     desvenlafaxine  (PRISTIQ ) 100 MG 24 hr tablet Take 1 tablet (100 mg total) by mouth daily. 90 tablet 3   digoxin  (LANOXIN ) 0.125 MG tablet TAKE 1 TABLET BY MOUTH DAILY 90 tablet 3   famotidine (PEPCID) 10 MG tablet Take 10 mg by mouth as needed for heartburn or indigestion.  ferrous sulfate  325 (65 FE) MG tablet Take 325 mg by mouth in the morning.     furosemide  (LASIX ) 80 MG tablet TAKE 1 TABLET BY MOUTH DAILY. OKAY TO TAKE EXTRA FOR WEIGHT GAIN> 3LB/ DAILY OR 5LB/WEEKLY 135 tablet 3   levothyroxine  (SYNTHROID , LEVOTHROID) 100 MCG tablet Take 100 mcg daily before breakfast by mouth.     Multiple Vitamin (MULTIVITAMIN WITH MINERALS) TABS Take 1 tablet by mouth in  the morning.     oxymetazoline  (AFRIN) 0.05 % nasal spray Place 1 spray into both nostrils daily as needed for congestion.     potassium chloride  SA (KLOR-CON  M20) 20 MEQ tablet TAKE 2 TABLETS(40 MEQ) BY MOUTH DAILY 180 tablet 3   rosuvastatin  (CRESTOR ) 20 MG tablet TAKE 1 TABLET BY MOUTH EVERY DAY 90 tablet 3   sacubitril -valsartan  (ENTRESTO ) 97-103 MG TAKE 1 TABLET BY MOUTH TWICE A DAY 180 tablet 3   traZODone  (DESYREL ) 50 MG tablet Take 1 tablet (50 mg total) by mouth at bedtime as needed. 90 tablet 3   tretinoin  (RETIN-A ) 0.05 % cream Apply 1 application topically at bedtime. Applied to face     warfarin (COUMADIN ) 4 MG tablet TAKE 1 TABLET BY MOUTH EVERY DAY AS DIRECTED BY COUMADIN  CLINIC 90 tablet 0   No current facility-administered medications for this visit.    Medication Side Effects: None  Allergies:  Allergies  Allergen Reactions   Codeine Anaphylaxis    Jerking, involuntary jerking    Dilantin [Phenytoin Sodium Extended] Rash and Itching   Metoprolol  Other (See Comments)    depression    Propoxyphene Other (See Comments)    Bad vivid dreams    Past Medical History:  Diagnosis Date   Anxiety    Aortic stenosis    status post aortic valve replacement with [employer] 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   Depression    DJD (degenerative joint disease) of knee    Dyslipidemia    GERD (gastroesophageal reflux disease)    "several years ago; none since" (11/12/2012)   Hyperlipidemia    Hyperlipidemia LDL goal <70 01/19/2017   Hypertension    Left atrial enlargement    Mitral regurgitation 06/25/2016   Mild moderate MR by echo 06/2016   Obesity    Persistent atrial fibrillation (HCC)    s/p afib ablation x 2 at [facility]   Sleep apnea    On CPAP at 12cm H2O   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 2.2 shortly after AVR - now cleared by neurosurgery to maintain INR 2-2.5    Family History  Problem Relation Age of Onset   Lung cancer Mother    Hypertension Mother    Hyperlipidemia Father    Hypertension Father    Breast cancer Sister 63   Heart disease Brother    BRCA 1/2 Neg Hx     Social History   Socioeconomic History   Marital status: Married    Spouse name: Not on file   Number of children: Not on file   Years of education: Not on file   Highest education level: Not on file  Occupational History   Not on file  Tobacco Use   Smoking status: Never   Smokeless tobacco: Never   Tobacco comments:  Never smoke 06/22/21  Substance and Sexual Activity   Alcohol use: No    Comment: 11/12/2012 "no alcohol in the last 9 years"   Drug use: No   Sexual activity: Not Currently  Other Topics Concern   Not on file  Social History Narrative   Pt lives in [place] with spouse.  Retired   Chief Executive Officer Drivers of Corporate investment banker Strain: Not on [employer] Insecurity: No Food Insecurity (03/23/2023)   Hunger Vital Sign    Worried About Running Out of Food in the Last Year: Never true    Ran Out of Food in the Last Year: Never true  Transportation Needs: No Transportation Needs (03/23/2023)   PRAPARE - Administrator, Civil Service (Medical): No    Lack of Transportation (Non-Medical): No  Physical Activity: Inactive (09/30/2019)   Exercise Vital Sign    Days of Exercise per Week: 0 days    Minutes of Exercise per Session: 0 min  Stress: Not on file  Social Connections: Unknown (09/30/2019)   Social Connection and Isolation Panel [NHANES]    Frequency of Communication with Friends and Family: Once a week    Frequency of Social Gatherings with Friends and Family: Not on file    Attends Religious Services: 1 to 4 times per year    Active Member of [employer] or Organizations: No    Attends Banker Meetings: Never     Marital Status: Married  Catering manager Violence: Not At Risk (03/23/2023)   Humiliation, Afraid, Rape, and Kick questionnaire    Fear of Current or Ex-Partner: No    Emotionally Abused: No    Physically Abused: No    Sexually Abused: No    Past Medical History, Surgical history, Social history, and Family history were reviewed and updated as appropriate.   Please see review of systems for further details on the patient's review from today.   Objective:   Physical Exam:  There were no vitals taken for this visit.  Physical Exam Constitutional:      General: She is not in acute distress. Musculoskeletal:        General: No deformity.  Neurological:     Mental Status: She is alert and oriented to person, place, and time.     Coordination: Coordination normal.  Psychiatric:        Attention and Perception: Attention and perception normal. She does not perceive auditory or visual hallucinations.        Mood and Affect: Mood normal. Affect is not labile, blunt, angry or inappropriate.        Speech: Speech normal.        Behavior: Behavior normal.        Thought Content: Thought content normal. Thought content is not paranoid or delusional. Thought content does not include homicidal or suicidal ideation. Thought content does not include homicidal or suicidal plan.        Cognition and Memory: Cognition and memory normal.        Judgment: Judgment normal.     Comments: Insight intact     Lab Review:     Component Value Date/Time   NA 139 03/24/2023 0807   NA 142 06/01/2022 1000   K 3.6 03/24/2023 0807   CL 103 03/24/2023 0807   CO2 27 03/24/2023 0807   GLUCOSE 111 (H) 03/24/2023 0807   BUN 24 (H) 03/24/2023 0807   BUN 21 06/01/2022 1000   CREATININE 1.12 (H)  03/24/2023 0807   CREATININE 0.82 12/28/2015 1000   CALCIUM  8.6 (L) 03/24/2023 0807   PROT 6.1 (L) 03/24/2023 0807   PROT 7.1 05/27/2018 1125   ALBUMIN 2.9 (L) 03/24/2023 0807   ALBUMIN 4.5 05/27/2018 1125    AST 46 (H) 03/24/2023 0807   ALT 36 03/24/2023 0807   ALKPHOS 72 03/24/2023 0807   BILITOT 0.8 03/24/2023 0807   BILITOT 0.4 05/27/2018 1125   GFRNONAA 51 (L) 03/24/2023 0807   GFRAA 82 02/05/2020 1120       Component Value Date/Time   WBC 9.7 03/27/2023 0626   RBC 4.05 03/27/2023 0626   HGB 12.3 03/27/2023 0626   HGB 11.7 09/28/2020 1017   HCT 37.0 03/27/2023 0626   HCT 34.6 09/28/2020 1017   PLT 299 03/27/2023 0626   PLT 207 09/28/2020 1017   MCV 91.4 03/27/2023 0626   MCV 92 09/28/2020 1017   MCH 30.4 03/27/2023 0626   MCHC 33.2 03/27/2023 0626   RDW 13.1 03/27/2023 0626   RDW 12.6 09/28/2020 1017   LYMPHSABS 1.0 03/24/2023 0807   LYMPHSABS 0.9 09/28/2020 1017   MONOABS 1.4 (H) 03/24/2023 0807   EOSABS 0.6 (H) 03/24/2023 0807   EOSABS 0.2 09/28/2020 1017   BASOSABS 0.0 03/24/2023 0807   BASOSABS 0.0 09/28/2020 1017    No results found for: "POCLITH", "LITHIUM"   No results found for: "PHENYTOIN", "PHENOBARB", "VALPROATE", "CBMZ"   .res Assessment: Plan:    Plan:  PDMP reviewed  Trazadone 50mg  for sleep Pristiq  100mg  daily  Wellbutrin  XL 150mg  every morning. Denies seizure history. Abilify  2mg  twice daily - sent in new script.  RTC 8 weeks  20 minutes spent dedicated to the care of this patient on the date of this encounter to include pre-visit review of records, ordering of medication, post visit documentation, and face-to-face time with the patient discussing depression. Discussed continuing current medication regimen.  Patient advised to contact office with any questions, adverse effects, or acute worsening in signs and symptoms.  Discussed potential metabolic side effects associated with atypical antipsychotics, as well as potential risk for movement side effects. Advised pt to contact office if movement side effects occur.    There are no diagnoses linked to this encounter.   Please see After Visit Summary for patient specific instructions.  Future  Appointments  Date Time Provider Department Center  07/30/2023  9:30 AM Jaquesha Boroff, Ursula Gardner, NP [facility] None  08/27/2023  9:45 AM [facility] None  01/29/2024 10:30 AM [id number]    No orders of the defined types were placed in this encounter.     -------------------------------

## 2023-08-07 NOTE — Telephone Encounter (Signed)
 Prior Authorization submitted with [employer] Part D due to quantity of Aripiprazole  2 mg take 2 tabs daily #60

## 2023-08-07 NOTE — Telephone Encounter (Signed)
 Prior approval received effective through 04/08/24 with [employer] medicare part d, aripiprazole  2 mg 2 tablets daily

## 2023-08-27 ENCOUNTER — Ambulatory Visit

## 2023-08-31 ENCOUNTER — Other Ambulatory Visit: Payer: Self-pay | Admitting: Adult Health

## 2023-08-31 DIAGNOSIS — F331 Major depressive disorder, recurrent, moderate: Secondary | ICD-10-CM

## 2023-09-06 ENCOUNTER — Ambulatory Visit: Attending: Cardiovascular Disease

## 2023-09-06 DIAGNOSIS — Z5181 Encounter for therapeutic drug level monitoring: Secondary | ICD-10-CM | POA: Diagnosis not present

## 2023-09-06 DIAGNOSIS — I480 Paroxysmal atrial fibrillation: Secondary | ICD-10-CM

## 2023-09-06 LAB — POCT INR: INR: 3.8 — AB (ref 2.0–3.0)

## 2023-09-06 NOTE — Patient Instructions (Signed)
 Description   HOLD today's dose and only take 1/2 tablet tomorrow and then continue taking Warfarin 1 tablet daily.  Recheck INR in 2 weeks.   Coumadin  Clinic [phone]

## 2023-09-17 ENCOUNTER — Ambulatory Visit: Attending: Cardiovascular Disease

## 2023-09-17 DIAGNOSIS — Z7901 Long term (current) use of anticoagulants: Secondary | ICD-10-CM | POA: Diagnosis not present

## 2023-09-17 DIAGNOSIS — Z5181 Encounter for therapeutic drug level monitoring: Secondary | ICD-10-CM | POA: Diagnosis not present

## 2023-09-17 DIAGNOSIS — I480 Paroxysmal atrial fibrillation: Secondary | ICD-10-CM | POA: Diagnosis not present

## 2023-09-17 LAB — POCT INR: INR: 3.3 — AB (ref 2.0–3.0)

## 2023-09-17 NOTE — Patient Instructions (Signed)
 Hold tonight only then Decrease to  1 tablet daily, except 0.5 tablet every Wednesday. Recheck INR in 3 weeks.   Coumadin  Clinic [phone]

## 2023-09-24 ENCOUNTER — Other Ambulatory Visit: Payer: Self-pay | Admitting: Cardiovascular Disease

## 2023-09-24 DIAGNOSIS — I480 Paroxysmal atrial fibrillation: Secondary | ICD-10-CM

## 2023-09-30 ENCOUNTER — Telehealth: Admitting: Adult Health

## 2023-09-30 ENCOUNTER — Encounter: Payer: Self-pay | Admitting: Adult Health

## 2023-09-30 DIAGNOSIS — G4700 Insomnia, unspecified: Secondary | ICD-10-CM | POA: Diagnosis not present

## 2023-09-30 DIAGNOSIS — F331 Major depressive disorder, recurrent, moderate: Secondary | ICD-10-CM | POA: Diagnosis not present

## 2023-09-30 MED ORDER — BUPROPION HCL ER (XL) 300 MG PO TB24: 300.0000 mg | ORAL_TABLET | Freq: Every day | ORAL | 2 refills | Status: DC

## 2023-09-30 NOTE — Progress Notes (Signed)
 [name] [id number] 10/15/1945 78 y.o.  Virtual Visit via Video Note  I connected with pt @ on 09/30/23 at  9:00 AM EDT by a video enabled telemedicine application and verified that I am speaking with the correct person using two identifiers.   I discussed the limitations of evaluation and management by telemedicine and the availability of in person appointments. The patient expressed understanding and agreed to proceed.  I discussed the assessment and treatment plan with the patient. The patient was provided an opportunity to ask questions and all were answered. The patient agreed with the plan and demonstrated an understanding of the instructions.   The patient was advised to call back or seek an in-person evaluation if the symptoms worsen or if the condition fails to improve as anticipated.  I provided 25 minutes of non-face-to-face time during this encounter.  The patient was located at home.  The provider was located at [facility].   Angeline LOISE Sayers, NP   Subjective:   Patient ID:  [name] is a 78 y.o. (DOB [birth year]) female.  Chief Complaint: No chief complaint on file.   HPI [name] presents for follow-up of MDD and insomnia.  Describes mood today as ok. Pleasant. Denies tearfulness. Mood symptoms - reports increased depression. Reports lower interest and motivation - everything is a struggle. Denies anxiety and irritability. Denies panic attacks. Denies worry, rumination and over thinking. Reports mood has declined. Stating I don't  feel like I'm doing as well. Feels like current medication was helpful initially, but mood has declined since last visit. Taking medications as prescribed.  Energy levels lower - terrible. Active, trying to exercise more. Reports she enjoys some usual interests and activities. Married. Lives with husband and 2 [place] terriers. Has a son, age 56. Has one step daughter - 22. Spending time with  family. Attends church.  Appetite adequate. Reports weight gain - 181 to 185 pounds. Sleeps well most nights. Averages 10 hours. Reports   daytime napping - 3 to 4 days a week this week.   Focus and concentration stable. Completing tasks. Managing aspects of household. Retired. Denies SI or HI.  Denies AH or VH. Denies self harm. Denies substance use.  Previous medication trials:  Xanax , Zoloft , Wellbutrin , Valium , Ambien , Lexapro    Review of Systems:  Review of Systems  Musculoskeletal:  Negative for gait problem.  Neurological:  Negative for tremors.  Psychiatric/Behavioral:         Please refer to HPI    Medications: I have reviewed the patient's current medications.  Current Outpatient Medications  Medication Sig Dispense Refill   acetaminophen  (TYLENOL ) 500 MG tablet Take 500 mg by mouth as needed.     ARIPiprazole  (ABILIFY ) 2 MG tablet TAKE 1 TABLET BY MOUTH TWICE A DAY 60 tablet 2   buPROPion  (WELLBUTRIN  XL) 300 MG 24 hr tablet Take 1 tablet (300 mg total) by mouth daily. 30 tablet 2   calcium -vitamin D  (OSCAL WITH D) 500-200 MG-UNIT per tablet Take 2 tablets by mouth in the morning.     carvedilol  (COREG ) 3.125 MG tablet TAKE 1 TABLET BY MOUTH TWICE A DAY WITH A MEAL 180 tablet 3   Cholecalciferol  (VITAMIN D ) 2000 UNITS tablet Take 4,000 Units by mouth in the morning.     desvenlafaxine  (PRISTIQ ) 100 MG 24 hr tablet Take 1 tablet (100 mg total) by mouth daily. 90 tablet 3   digoxin  (LANOXIN ) 0.125 MG tablet TAKE 1 TABLET BY MOUTH DAILY 90  tablet 3   famotidine (PEPCID) 10 MG tablet Take 10 mg by mouth as needed for heartburn or indigestion.     ferrous sulfate  325 (65 FE) MG tablet Take 325 mg by mouth in the morning.     furosemide  (LASIX ) 80 MG tablet TAKE 1 TABLET BY MOUTH DAILY. OKAY TO TAKE EXTRA FOR WEIGHT GAIN> 3LB/ DAILY OR 5LB/WEEKLY 135 tablet 3   levothyroxine  (SYNTHROID , LEVOTHROID) 100 MCG tablet Take 100 mcg daily before breakfast by mouth.     Multiple  Vitamin (MULTIVITAMIN WITH MINERALS) TABS Take 1 tablet by mouth in the morning.     oxymetazoline  (AFRIN) 0.05 % nasal spray Place 1 spray into both nostrils daily as needed for congestion.     potassium chloride  SA (KLOR-CON  M20) 20 MEQ tablet TAKE 2 TABLETS(40 MEQ) BY MOUTH DAILY 180 tablet 3   rosuvastatin  (CRESTOR ) 20 MG tablet TAKE 1 TABLET BY MOUTH EVERY DAY 90 tablet 3   sacubitril -valsartan  (ENTRESTO ) 97-103 MG TAKE 1 TABLET BY MOUTH TWICE A DAY 180 tablet 3   traZODone  (DESYREL ) 50 MG tablet Take 1 tablet (50 mg total) by mouth at bedtime as needed. 90 tablet 3   tretinoin  (RETIN-A ) 0.05 % cream Apply 1 application topically at bedtime. Applied to face     warfarin (COUMADIN ) 4 MG tablet TAKE 1 TABLET BY MOUTH EVERY DAY AS DIRECTED BY COUMADIN  CLINIC 90 tablet 0   No current facility-administered medications for this visit.    Medication Side Effects: None  Allergies:  Allergies  Allergen Reactions   Codeine Anaphylaxis    Jerking, involuntary jerking    Dilantin [Phenytoin Sodium Extended] Rash and Itching   Metoprolol  Other (See Comments)    depression    Propoxyphene Other (See Comments)    Bad vivid dreams    Past Medical History:  Diagnosis Date   Anxiety    Aortic stenosis    status post aortic valve replacement with 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   Depression    DJD (degenerative joint disease) of knee    Dyslipidemia    GERD (gastroesophageal reflux disease)    several years ago; none since (11/12/2012)   Hyperlipidemia    Hyperlipidemia LDL goal <70 01/19/2017   Hypertension    Left atrial enlargement    Mitral regurgitation 06/25/2016   Mild moderate MR by echo 06/2016   Obesity    Persistent atrial fibrillation (HCC)    s/p afib ablation x 2 at [facility]   Sleep  apnea    On CPAP at 12cm H2O   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 - now cleared by neurosurgery to maintain INR 2-2.5    Family History  Problem Relation Age of Onset   Lung cancer Mother    Hypertension Mother    Hyperlipidemia Father    Hypertension Father    Breast cancer Sister 75   Heart disease Brother    BRCA 1/2 Neg Hx     Social History   Socioeconomic History   Marital status: Married    Spouse name: Not on file   Number of children: Not on file   Years of education: Not on file   Highest education level: Not on file  Occupational History   Not on file  Tobacco Use   Smoking status: Never   Smokeless tobacco: Never   Tobacco comments:    Never smoke 06/22/21  Substance and Sexual Activity   Alcohol use: No    Comment: 11/12/2012 no alcohol in the last 9 years   Drug use: No   Sexual activity: Not Currently  Other Topics Concern   Not on file  Social History Narrative   Pt lives in [place] with spouse.  Retired   Chief Executive Officer Drivers of Corporate investment banker Strain: Not on [employer] Insecurity: No Food Insecurity (03/23/2023)   Hunger Vital Sign    Worried About Running Out of Food in the Last Year: Never true    Ran Out of Food in the Last Year: Never true  Transportation Needs: No Transportation Needs (03/23/2023)   PRAPARE - Administrator, Civil Service (Medical): No    Lack of Transportation (Non-Medical): No  Physical Activity: Inactive (09/30/2019)   Exercise Vital Sign    Days of Exercise per Week: 0 days    Minutes of Exercise per Session: 0 min  Stress: Not on file  Social Connections: Unknown (09/30/2019)   Social Connection and Isolation Panel    Frequency of Communication with Friends and Family: Once a week    Frequency of Social Gatherings with Friends and Family: Not on file    Attends Religious Services: 1 to 4 times per year    Active Member of [employer] or Organizations: No     Attends Banker Meetings: Never    Marital Status: Married  Catering manager Violence: Not At Risk (03/23/2023)   Humiliation, Afraid, Rape, and Kick questionnaire    Fear of Current or Ex-Partner: No    Emotionally Abused: No    Physically Abused: No    Sexually Abused: No    Past Medical History, Surgical history, Social history, and Family history were reviewed and updated as appropriate.   Please see review of systems for further details on the patient's review from today.   Objective:   Physical Exam:  There were no vitals taken for this visit.  Physical Exam Constitutional:      General: She is not in acute distress.  Musculoskeletal:        General: No deformity.   Neurological:     Mental Status: She is alert and oriented to person, place, and time.     Coordination: Coordination normal.   Psychiatric:        Attention and Perception: Attention and perception normal. She does not perceive auditory or visual hallucinations.        Mood and Affect: Mood is depressed. Mood is not anxious. Affect is not labile, blunt, angry or inappropriate.        Speech: Speech normal.        Behavior: Behavior normal.        Thought Content: Thought content normal. Thought content is not paranoid or delusional. Thought content does not include homicidal or suicidal ideation. Thought content does not include homicidal or suicidal plan.        Cognition and Memory: Cognition and memory normal.        Judgment: Judgment normal.     Comments: Insight intact     Lab Review:     Component Value Date/Time   NA 139 03/24/2023 0807   NA 142 06/01/2022 1000   K 3.6 03/24/2023 0807   CL  103 03/24/2023 0807   CO2 27 03/24/2023 0807   GLUCOSE 111 (H) 03/24/2023 0807   BUN 24 (H) 03/24/2023 0807   BUN 21 06/01/2022 1000   CREATININE 1.12 (H) 03/24/2023 0807   CREATININE 0.82 12/28/2015 1000   CALCIUM  8.6 (L) 03/24/2023 0807   PROT 6.1 (L) 03/24/2023 0807   PROT 7.1  05/27/2018 1125   ALBUMIN 2.9 (L) 03/24/2023 0807   ALBUMIN 4.5 05/27/2018 1125   AST 46 (H) 03/24/2023 0807   ALT 36 03/24/2023 0807   ALKPHOS 72 03/24/2023 0807   BILITOT 0.8 03/24/2023 0807   BILITOT 0.4 05/27/2018 1125   GFRNONAA 51 (L) 03/24/2023 0807   GFRAA 82 02/05/2020 1120       Component Value Date/Time   WBC 9.7 03/27/2023 0626   RBC 4.05 03/27/2023 0626   HGB 12.3 03/27/2023 0626   HGB 11.7 09/28/2020 1017   HCT 37.0 03/27/2023 0626   HCT 34.6 09/28/2020 1017   PLT 299 03/27/2023 0626   PLT 207 09/28/2020 1017   MCV 91.4 03/27/2023 0626   MCV 92 09/28/2020 1017   MCH 30.4 03/27/2023 0626   MCHC 33.2 03/27/2023 0626   RDW 13.1 03/27/2023 0626   RDW 12.6 09/28/2020 1017   LYMPHSABS 1.0 03/24/2023 0807   LYMPHSABS 0.9 09/28/2020 1017   MONOABS 1.4 (H) 03/24/2023 0807   EOSABS 0.6 (H) 03/24/2023 0807   EOSABS 0.2 09/28/2020 1017   BASOSABS 0.0 03/24/2023 0807   BASOSABS 0.0 09/28/2020 1017    No results found for: POCLITH, LITHIUM   No results found for: PHENYTOIN, PHENOBARB, VALPROATE, CBMZ   .res Assessment: Plan:    Plan:  PDMP reviewed  Increase Wellbutrin  XL 150mg  to 300 every morning. Denies seizure history.  Trazadone 50mg  for sleep Pristiq  100mg  daily  Abilify  5mg  daily   Consider Vraylar  RTC 4 weeks  25 minutes spent dedicated to the care of this patient on the date of this encounter to include pre-visit review of records, ordering of medication, post visit documentation, and face-to-face time with the patient discussing depression. Discussed continuing current medication regimen.  Patient advised to contact office with any questions, adverse effects, or acute worsening in signs and symptoms.  Discussed potential metabolic side effects associated with atypical antipsychotics, as well as potential risk for movement side effects. Advised pt to contact office if movement side effects occur.    Diagnoses and all orders for  this visit:  Insomnia, unspecified type  Major depressive disorder, recurrent episode, moderate (HCC) -     buPROPion  (WELLBUTRIN  XL) 300 MG 24 hr tablet; Take 1 tablet (300 mg total) by mouth daily.     Please see After Visit Summary for patient specific instructions.  Future Appointments  Date Time Provider Department Center  10/08/2023  9:45 AM [facility] COUMADIN  CLINIC 2 [facility] H&V  01/29/2024 10:30 AM [facility] 4 [facility] H&V    No orders of the defined types were placed in this encounter.     -------------------------------

## 2023-10-08 ENCOUNTER — Ambulatory Visit: Attending: Cardiovascular Disease | Admitting: *Deleted

## 2023-10-08 DIAGNOSIS — I480 Paroxysmal atrial fibrillation: Secondary | ICD-10-CM | POA: Diagnosis not present

## 2023-10-08 DIAGNOSIS — Z5181 Encounter for therapeutic drug level monitoring: Secondary | ICD-10-CM

## 2023-10-08 DIAGNOSIS — Z7901 Long term (current) use of anticoagulants: Secondary | ICD-10-CM | POA: Diagnosis not present

## 2023-10-08 LAB — POCT INR: INR: 2.7 (ref 2.0–3.0)

## 2023-10-08 NOTE — Progress Notes (Signed)
Please see anticoagulation encounter.

## 2023-10-08 NOTE — Patient Instructions (Signed)
 Description   Today 1/2 tablet of warfarin then continue taking warfarin 1 tablet daily, except 1/2 tablet every Wednesday. Recheck INR in 3 weeks.   Coumadin  Clinic [phone]

## 2023-10-29 ENCOUNTER — Encounter

## 2023-10-29 ENCOUNTER — Ambulatory Visit: Attending: Internal Medicine

## 2023-10-29 DIAGNOSIS — Z5181 Encounter for therapeutic drug level monitoring: Secondary | ICD-10-CM | POA: Diagnosis not present

## 2023-10-29 DIAGNOSIS — I480 Paroxysmal atrial fibrillation: Secondary | ICD-10-CM | POA: Diagnosis not present

## 2023-10-29 DIAGNOSIS — Z7901 Long term (current) use of anticoagulants: Secondary | ICD-10-CM | POA: Diagnosis not present

## 2023-10-29 LAB — POCT INR: INR: 3 (ref 2.0–3.0)

## 2023-10-29 NOTE — Patient Instructions (Signed)
 Decrease to 1 tablet daily, except 1/2 tablet every Wednesday and Friday. Recheck INR in 3 weeks.   Coumadin  Clinic [phone]

## 2023-10-29 NOTE — Progress Notes (Signed)
 INR 3.0; Please see anticoagulation encounter

## 2023-10-30 ENCOUNTER — Telehealth: Admitting: Adult Health

## 2023-10-30 ENCOUNTER — Encounter: Payer: Self-pay | Admitting: Adult Health

## 2023-10-30 DIAGNOSIS — F331 Major depressive disorder, recurrent, moderate: Secondary | ICD-10-CM

## 2023-10-30 DIAGNOSIS — F32A Depression, unspecified: Secondary | ICD-10-CM | POA: Diagnosis not present

## 2023-10-30 DIAGNOSIS — G4700 Insomnia, unspecified: Secondary | ICD-10-CM

## 2023-10-30 NOTE — Progress Notes (Signed)
 [name] [id number] March 13, 1946 78 y.o.  Virtual Visit via Video Note  I connected with pt @ on 10/30/23 at  9:00 AM EDT by a video enabled telemedicine application and verified that I am speaking with the correct person using two identifiers.   I discussed the limitations of evaluation and management by telemedicine and the availability of in person appointments. The patient expressed understanding and agreed to proceed.  I discussed the assessment and treatment plan with the patient. The patient was provided an opportunity to ask questions and all were answered. The patient agreed with the plan and demonstrated an understanding of the instructions.   The patient was advised to call back or seek an in-person evaluation if the symptoms worsen or if the condition fails to improve as anticipated.  I provided 15 minutes of non-face-to-face time during this encounter.  The patient was located at home.  The provider was located at [facility].   Angeline LOISE Sayers, NP   Subjective:   Patient ID:  [name] is a 78 y.o. (DOB 1946/03/26) female.  Chief Complaint: No chief complaint on file.   HPI [name] presents for follow-up of MDD and insomnia.  Describes mood today as ok. Pleasant. Denies tearfulness. Mood symptoms - reports decreased depression. Reports improved interest and motivation. Denies anxiety and irritability. Denies panic attacks. Denies worry, rumination and over thinking. Reports mood has improved with increase in Wellbutrin . Stating I feel like I'm doing better. Feels like the increase in Wellbutrin  has been helpful. Taking medications as prescribed.  Energy levels improved. Active, trying to exercise more. Reports she enjoys some usual interests and activities. Married. Lives with husband and 2 [place] terriers. Has a son, age 27. Has one step daughter - 28. Spending time with family. Attends church.  Appetite adequate. Reports  weight loss - 181 from 185 pounds. Sleeps well most nights. Averages 8 to 10 hours. Denies daytime napping.    Focus and concentration stable. Completing tasks. Managing aspects of household. Retired. Denies SI or HI.  Denies AH or VH. Denies self harm. Denies substance use.  Previous medication trials:  Xanax , Zoloft , Wellbutrin , Valium , Ambien , Lexapro   Review of Systems:  Review of Systems  Musculoskeletal:  Negative for gait problem.  Neurological:  Negative for tremors.  Psychiatric/Behavioral:         Please refer to HPI    Medications: I have reviewed the patient's current medications.  Current Outpatient Medications  Medication Sig Dispense Refill   acetaminophen  (TYLENOL ) 500 MG tablet Take 500 mg by mouth as needed.     ARIPiprazole  (ABILIFY ) 2 MG tablet TAKE 1 TABLET BY MOUTH TWICE A DAY 60 tablet 2   buPROPion  (WELLBUTRIN  XL) 300 MG 24 hr tablet Take 1 tablet (300 mg total) by mouth daily. 30 tablet 2   calcium -vitamin D  (OSCAL WITH D) 500-200 MG-UNIT per tablet Take 2 tablets by mouth in the morning.     carvedilol  (COREG ) 3.125 MG tablet TAKE 1 TABLET BY MOUTH TWICE A DAY WITH A MEAL 180 tablet 3   Cholecalciferol  (VITAMIN D ) 2000 UNITS tablet Take 4,000 Units by mouth in the morning.     desvenlafaxine  (PRISTIQ ) 100 MG 24 hr tablet Take 1 tablet (100 mg total) by mouth daily. 90 tablet 3   digoxin  (LANOXIN ) 0.125 MG tablet TAKE 1 TABLET BY MOUTH DAILY 90 tablet 3   famotidine (PEPCID) 10 MG tablet Take 10 mg by mouth as needed for heartburn or indigestion.  ferrous sulfate  325 (65 FE) MG tablet Take 325 mg by mouth in the morning.     furosemide  (LASIX ) 80 MG tablet TAKE 1 TABLET BY MOUTH DAILY. OKAY TO TAKE EXTRA FOR WEIGHT GAIN> 3LB/ DAILY OR 5LB/WEEKLY 135 tablet 3   levothyroxine  (SYNTHROID , LEVOTHROID) 100 MCG tablet Take 100 mcg daily before breakfast by mouth.     Multiple Vitamin (MULTIVITAMIN WITH MINERALS) TABS Take 1 tablet by mouth in the morning.      oxymetazoline  (AFRIN) 0.05 % nasal spray Place 1 spray into both nostrils daily as needed for congestion.     potassium chloride  SA (KLOR-CON  M20) 20 MEQ tablet TAKE 2 TABLETS(40 MEQ) BY MOUTH DAILY 180 tablet 3   rosuvastatin  (CRESTOR ) 20 MG tablet TAKE 1 TABLET BY MOUTH EVERY DAY 90 tablet 3   sacubitril -valsartan  (ENTRESTO ) 97-103 MG TAKE 1 TABLET BY MOUTH TWICE A DAY 180 tablet 3   traZODone  (DESYREL ) 50 MG tablet Take 1 tablet (50 mg total) by mouth at bedtime as needed. 90 tablet 3   tretinoin  (RETIN-A ) 0.05 % cream Apply 1 application topically at bedtime. Applied to face     warfarin (COUMADIN ) 4 MG tablet TAKE 1 TABLET BY MOUTH EVERY DAY AS DIRECTED BY COUMADIN  CLINIC 90 tablet 0   No current facility-administered medications for this visit.    Medication Side Effects: None  Allergies:  Allergies  Allergen Reactions   Codeine Anaphylaxis    Jerking, involuntary jerking    Dilantin [Phenytoin Sodium Extended] Rash and Itching   Metoprolol  Other (See Comments)    depression    Propoxyphene Other (See Comments)    Bad vivid dreams    Past Medical History:  Diagnosis Date   Anxiety    Aortic stenosis    status post aortic valve replacement with St. 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   Depression    DJD (degenerative joint disease) of knee    Dyslipidemia    GERD (gastroesophageal reflux disease)    several years ago; none since (11/12/2012)   Hyperlipidemia    Hyperlipidemia LDL goal <70 01/19/2017   Hypertension    Left atrial enlargement    Mitral regurgitation 06/25/2016   Mild moderate MR by echo 06/2016   Obesity    Persistent atrial fibrillation (HCC)    s/p afib ablation x 2 at [facility]   Sleep apnea    On CPAP at 12cm H2O   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 INR  greater than 2.2 shortly after AVR - now cleared by neurosurgery to maintain INR 2-2.5    Family History  Problem Relation Age of Onset   Lung cancer Mother    Hypertension Mother    Hyperlipidemia Father    Hypertension Father    Breast cancer Sister 21   Heart disease Brother    BRCA 1/2 Neg Hx     Social History   Socioeconomic History   Marital status: Married    Spouse name: Not on file   Number of children: Not on file   Years of education: Not on file   Highest education level: Not on file  Occupational History   Not on file  Tobacco Use   Smoking status: Never   Smokeless tobacco: Never   Tobacco comments:  Never smoke 06/22/21  Substance and Sexual Activity   Alcohol use: No    Comment: 11/12/2012 no alcohol in the last 9 years   Drug use: No   Sexual activity: Not Currently  Other Topics Concern   Not on file  Social History Narrative   Pt lives in [place] with spouse.  Retired   Chief Executive Officer Drivers of Corporate investment banker Strain: Not on [employer] Insecurity: No Food Insecurity (03/23/2023)   Hunger Vital Sign    Worried About Running Out of Food in the Last Year: Never true    Ran Out of Food in the Last Year: Never true  Transportation Needs: No Transportation Needs (03/23/2023)   PRAPARE - Administrator, Civil Service (Medical): No    Lack of Transportation (Non-Medical): No  Physical Activity: Inactive (09/30/2019)   Exercise Vital Sign    Days of Exercise per Week: 0 days    Minutes of Exercise per Session: 0 min  Stress: Not on file  Social Connections: Unknown (09/30/2019)   Social Connection and Isolation Panel    Frequency of Communication with Friends and Family: Once a week    Frequency of Social Gatherings with Friends and Family: Not on file    Attends Religious Services: 1 to 4 times per year    Active Member of [employer] or Organizations: No    Attends Banker Meetings: Never    Marital Status: Married   Catering manager Violence: Not At Risk (03/23/2023)   Humiliation, Afraid, Rape, and Kick questionnaire    Fear of Current or Ex-Partner: No    Emotionally Abused: No    Physically Abused: No    Sexually Abused: No    Past Medical History, Surgical history, Social history, and Family history were reviewed and updated as appropriate.   Please see review of systems for further details on the patient's review from today.   Objective:   Physical Exam:  There were no vitals taken for this visit.  Physical Exam Constitutional:      General: She is not in acute distress. Musculoskeletal:        General: No deformity.  Neurological:     Mental Status: She is alert and oriented to person, place, and time.     Coordination: Coordination normal.  Psychiatric:        Attention and Perception: Attention and perception normal. She does not perceive auditory or visual hallucinations.        Mood and Affect: Mood normal. Mood is not anxious or depressed. Affect is not labile, blunt, angry or inappropriate.        Speech: Speech normal.        Behavior: Behavior normal.        Thought Content: Thought content normal. Thought content is not paranoid or delusional. Thought content does not include homicidal or suicidal ideation. Thought content does not include homicidal or suicidal plan.        Cognition and Memory: Cognition and memory normal.        Judgment: Judgment normal.     Comments: Insight intact     Lab Review:     Component Value Date/Time   NA 139 03/24/2023 0807   NA 142 06/01/2022 1000   K 3.6 03/24/2023 0807   CL 103 03/24/2023 0807   CO2 27 03/24/2023 0807   GLUCOSE 111 (H) 03/24/2023 0807   BUN 24 (H) 03/24/2023 0807   BUN 21 06/01/2022 1000  CREATININE 1.12 (H) 03/24/2023 0807   CREATININE 0.82 12/28/2015 1000   CALCIUM  8.6 (L) 03/24/2023 0807   PROT 6.1 (L) 03/24/2023 0807   PROT 7.1 05/27/2018 1125   ALBUMIN 2.9 (L) 03/24/2023 0807   ALBUMIN 4.5 05/27/2018  1125   AST 46 (H) 03/24/2023 0807   ALT 36 03/24/2023 0807   ALKPHOS 72 03/24/2023 0807   BILITOT 0.8 03/24/2023 0807   BILITOT 0.4 05/27/2018 1125   GFRNONAA 51 (L) 03/24/2023 0807   GFRAA 82 02/05/2020 1120       Component Value Date/Time   WBC 9.7 03/27/2023 0626   RBC 4.05 03/27/2023 0626   HGB 12.3 03/27/2023 0626   HGB 11.7 09/28/2020 1017   HCT 37.0 03/27/2023 0626   HCT 34.6 09/28/2020 1017   PLT 299 03/27/2023 0626   PLT 207 09/28/2020 1017   MCV 91.4 03/27/2023 0626   MCV 92 09/28/2020 1017   MCH 30.4 03/27/2023 0626   MCHC 33.2 03/27/2023 0626   RDW 13.1 03/27/2023 0626   RDW 12.6 09/28/2020 1017   LYMPHSABS 1.0 03/24/2023 0807   LYMPHSABS 0.9 09/28/2020 1017   MONOABS 1.4 (H) 03/24/2023 0807   EOSABS 0.6 (H) 03/24/2023 0807   EOSABS 0.2 09/28/2020 1017   BASOSABS 0.0 03/24/2023 0807   BASOSABS 0.0 09/28/2020 1017    No results found for: POCLITH, LITHIUM   No results found for: PHENYTOIN, PHENOBARB, VALPROATE, CBMZ   .res Assessment: Plan:    Plan:  PDMP reviewed  Wellbutrin  XL 300 every morning. Denies seizure history. Denies hand tremors.  Trazadone 50mg  for sleep Pristiq  100mg  daily  Abilify  5mg  daily   Consider Vraylar  RTC 6 weeks  15 minutes spent dedicated to the care of this patient on the date of this encounter to include pre-visit review of records, ordering of medication, post visit documentation, and face-to-face time with the patient discussing depression. Discussed continuing current medication regimen.  Patient advised to contact office with any questions, adverse effects, or acute worsening in signs and symptoms.  Discussed potential metabolic side effects associated with atypical antipsychotics, as well as potential risk for movement side effects. Advised pt to contact office if movement side effects occur.    There are no diagnoses linked to this encounter.   Please see After Visit Summary for patient specific  instructions.  Future Appointments  Date Time Provider Department Center  10/30/2023  9:00 AM Cashe Gatt, Angeline Mattocks, NP CP-CP None  11/19/2023  9:30 AM [facility] COUMADIN  CLINIC 2 [facility] H&V  01/29/2024 10:30 AM HVC-ECHO 4 HVC-ECHO H&V  01/31/2024  9:40 AM Croitoru, Mihai, MD [facility] H&V    No orders of the defined types were placed in this encounter.     -------------------------------

## 2023-11-19 ENCOUNTER — Ambulatory Visit: Attending: Cardiovascular Disease

## 2023-11-19 DIAGNOSIS — Z5181 Encounter for therapeutic drug level monitoring: Secondary | ICD-10-CM | POA: Diagnosis not present

## 2023-11-19 DIAGNOSIS — I480 Paroxysmal atrial fibrillation: Secondary | ICD-10-CM

## 2023-11-19 DIAGNOSIS — Z7901 Long term (current) use of anticoagulants: Secondary | ICD-10-CM

## 2023-11-19 LAB — POCT INR: INR: 2.3 (ref 2.0–3.0)

## 2023-11-19 NOTE — Patient Instructions (Signed)
 Continue 1 tablet daily, except 1/2 tablet every Wednesday and Friday. Recheck INR in 5 weeks.   Coumadin  Clinic [phone]

## 2023-11-19 NOTE — Progress Notes (Signed)
 INR-2.3; Please see anticoagulation encounter

## 2023-11-25 ENCOUNTER — Other Ambulatory Visit: Payer: Self-pay | Admitting: Adult Health

## 2023-11-25 DIAGNOSIS — F331 Major depressive disorder, recurrent, moderate: Secondary | ICD-10-CM

## 2023-11-26 NOTE — Telephone Encounter (Signed)
 Called pharmacy to see if it was a qty issue or just the medication. It went thru this time.

## 2023-11-29 ENCOUNTER — Telehealth: Payer: Self-pay

## 2023-11-29 MED ORDER — ARIPIPRAZOLE 5 MG PO TABS
5.0000 mg | ORAL_TABLET | Freq: Every day | ORAL | 0 refills | Status: DC
Start: 2023-11-29 — End: 2023-12-11

## 2023-11-29 NOTE — Telephone Encounter (Signed)
 SABRA

## 2023-12-05 ENCOUNTER — Ambulatory Visit: Admitting: Podiatry

## 2023-12-05 ENCOUNTER — Telehealth: Payer: Self-pay | Admitting: Podiatry

## 2023-12-05 DIAGNOSIS — M10072 Idiopathic gout, left ankle and foot: Secondary | ICD-10-CM

## 2023-12-05 DIAGNOSIS — M65972 Unspecified synovitis and tenosynovitis, left ankle and foot: Secondary | ICD-10-CM | POA: Diagnosis not present

## 2023-12-05 MED ORDER — METHYLPREDNISOLONE 4 MG PO TBPK: ORAL_TABLET | ORAL | 0 refills | Status: DC

## 2023-12-05 MED ORDER — COLCHICINE 0.6 MG PO TABS: 0.6000 mg | ORAL_TABLET | Freq: Every day | ORAL | 0 refills | Status: DC

## 2023-12-05 NOTE — Telephone Encounter (Signed)
 As per patient, would like to know if she is suppose to take medication for the entire 30 days or until no longer experiencing gout symptoms

## 2023-12-05 NOTE — Progress Notes (Signed)
 Subjective:  Patient ID: [name], female    DOB: 1945/08/04,  MRN: [id number]  Chief Complaint  Patient presents with   Gout    Patient states since Sunday left foot swollen, red, warm to the touch. No injury or trauma    78 y.o. female presents with the above complaint.  Patient presents with complaint of left midfoot red hot swollen joint.  She states it came out of nowhere.  She her diet has been fine no injury or trauma wanted to get it evaluated she is on a lot of medication pain scale 7 out of 10 dull aching nature would like to discuss treatment options for that.  It started on Sunday came out of nowhere.   Review of Systems: Negative except as noted in the HPI. Denies N/V/F/Ch.  Past Medical History:  Diagnosis Date   Anxiety    Aortic stenosis    status post aortic valve replacement with St. 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   Depression    DJD (degenerative joint disease) of knee    Dyslipidemia    GERD (gastroesophageal reflux disease)    several years ago; none since (11/12/2012)   Hyperlipidemia    Hyperlipidemia LDL goal <70 01/19/2017   Hypertension    Left atrial enlargement    Mitral regurgitation 06/25/2016   Mild moderate MR by echo 06/2016   Obesity    Persistent atrial fibrillation (HCC)    s/p afib ablation x 2 at [facility]   Sleep apnea    On CPAP at 12cm H2O   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 - now cleared by neurosurgery to maintain INR 2-2.5    Current Outpatient Medications:    colchicine  0.6 MG tablet, Take 1 tablet (0.6 mg total) by mouth daily., Disp: 30 tablet, Rfl: 0   methylPREDNISolone  (MEDROL  DOSEPAK) 4 MG TBPK tablet, Take as directed, Disp: 21 each, Rfl: 0   acetaminophen  (TYLENOL ) 500 MG tablet, Take  500 mg by mouth as needed., Disp: , Rfl:    ARIPiprazole  (ABILIFY ) 2 MG tablet, TAKE 1 TABLET BY MOUTH TWICE A DAY, Disp: 60 tablet, Rfl: 2   ARIPiprazole  (ABILIFY ) 5 MG tablet, Take 1 tablet (5 mg total) by mouth daily., Disp: 30 tablet, Rfl: 0   buPROPion  (WELLBUTRIN  XL) 300 MG 24 hr tablet, Take 1 tablet (300 mg total) by mouth daily., Disp: 30 tablet, Rfl: 2   calcium -vitamin D  (OSCAL WITH D) 500-200 MG-UNIT per tablet, Take 2 tablets by mouth in the morning., Disp: , Rfl:    carvedilol  (COREG ) 3.125 MG tablet, TAKE 1 TABLET BY MOUTH TWICE A DAY WITH A MEAL, Disp: 180 tablet, Rfl: 3   Cholecalciferol  (VITAMIN D ) 2000 UNITS tablet, Take 4,000 Units by mouth in the morning., Disp: , Rfl:    desvenlafaxine  (PRISTIQ ) 100 MG 24 hr tablet, Take 1 tablet (100 mg total) by mouth daily., Disp: 90 tablet, Rfl: 3   digoxin  (LANOXIN ) 0.125 MG tablet, TAKE 1 TABLET BY MOUTH DAILY, Disp: 90 tablet, Rfl: 3   famotidine (PEPCID) 10 MG tablet, Take 10 mg by mouth as needed for heartburn or indigestion., Disp: , Rfl:    ferrous sulfate  325 (65 FE) MG tablet, Take 325 mg by  mouth in the morning., Disp: , Rfl:    furosemide  (LASIX ) 80 MG tablet, TAKE 1 TABLET BY MOUTH DAILY. OKAY TO TAKE EXTRA FOR WEIGHT GAIN> 3LB/ DAILY OR 5LB/WEEKLY, Disp: 135 tablet, Rfl: 3   levothyroxine  (SYNTHROID , LEVOTHROID) 100 MCG tablet, Take 100 mcg daily before breakfast by mouth., Disp: , Rfl:    Multiple Vitamin (MULTIVITAMIN WITH MINERALS) TABS, Take 1 tablet by mouth in the morning., Disp: , Rfl:    oxymetazoline  (AFRIN) 0.05 % nasal spray, Place 1 spray into both nostrils daily as needed for congestion., Disp: , Rfl:    potassium chloride  SA (KLOR-CON  M20) 20 MEQ tablet, TAKE 2 TABLETS(40 MEQ) BY MOUTH DAILY, Disp: 180 tablet, Rfl: 3   rosuvastatin  (CRESTOR ) 20 MG tablet, TAKE 1 TABLET BY MOUTH EVERY DAY, Disp: 90 tablet, Rfl: 3   sacubitril -valsartan  (ENTRESTO ) 97-103 MG, TAKE 1 TABLET BY MOUTH TWICE A DAY, Disp: 180 tablet, Rfl:  3   traZODone  (DESYREL ) 50 MG tablet, Take 1 tablet (50 mg total) by mouth at bedtime as needed., Disp: 90 tablet, Rfl: 3   tretinoin  (RETIN-A ) 0.05 % cream, Apply 1 application topically at bedtime. Applied to face, Disp: , Rfl:    warfarin (COUMADIN ) 4 MG tablet, TAKE 1 TABLET BY MOUTH EVERY DAY AS DIRECTED BY COUMADIN  CLINIC, Disp: 90 tablet, Rfl: 0  Social History   Tobacco Use  Smoking Status Never  Smokeless Tobacco Never  Tobacco Comments   Never smoke 06/22/21    Allergies  Allergen Reactions   Codeine Anaphylaxis    Jerking, involuntary jerking    Dilantin [Phenytoin Sodium Extended] Rash and Itching   Metoprolol  Other (See Comments)    depression    Propoxyphene Other (See Comments)    Bad vivid dreams   Objective:  There were no vitals filed for this visit. There is no height or weight on file to calculate BMI. Constitutional Well developed. Well nourished.  Vascular Dorsalis pedis pulses palpable bilaterally. Posterior tibial pulses palpable bilaterally. Capillary refill normal to all digits.  No cyanosis or clubbing noted. Pedal hair growth normal.  Neurologic Normal speech. Oriented to person, place, and time. Epicritic sensation to light touch grossly present bilaterally.  Dermatologic Nails well groomed and normal in appearance. No open wounds. No skin lesions.  Orthopedic: Pain on palpation left dorsal midfoot red hot swollen joint noted consistent with gouty flare.  Clinically able appreciate some arthritic changes.  No open wounds or lesion noted no signs of infection noted   Radiographs: None Assessment:   1. Acute idiopathic gout of left foot   2. Synovitis of left foot    Plan:  Patient was evaluated and treated and all questions answered.  Left dorsal midfoot gout flare with underlying synovitis - All questions and concerns were discussed with the patient extensive detail - I discussed diet management in extensive detail for the most part  she mostly does white meat and some fish.  I encouraged her to continue monitoring.  I also discussed with her it could be due to one of the medication that she is on as well.  For now we will continue to monitor - Given the amount of pain that she is having should benefit from steroid injections -A steroid injection was performed at left dorsal first tarsometatarsal joint using 1% plain Lidocaine  and 10 mg of Kenalog. This was well tolerated. - Medrol  Dosepak and colchicine  were dispensed -   No follow-ups on file.

## 2023-12-11 ENCOUNTER — Encounter: Payer: Self-pay | Admitting: Adult Health

## 2023-12-11 ENCOUNTER — Telehealth: Admitting: Adult Health

## 2023-12-11 DIAGNOSIS — F331 Major depressive disorder, recurrent, moderate: Secondary | ICD-10-CM

## 2023-12-11 DIAGNOSIS — G4700 Insomnia, unspecified: Secondary | ICD-10-CM | POA: Diagnosis not present

## 2023-12-11 MED ORDER — ARIPIPRAZOLE 5 MG PO TABS: 5.0000 mg | ORAL_TABLET | Freq: Every day | ORAL | 0 refills | Status: DC

## 2023-12-11 MED ORDER — BUPROPION HCL ER (XL) 300 MG PO TB24
300.0000 mg | ORAL_TABLET | Freq: Every day | ORAL | 0 refills | Status: AC
Start: 2023-12-11 — End: ?

## 2023-12-11 NOTE — Progress Notes (Signed)
 [name] [id number] Dec 09, 1945 78 y.o.  Virtual Visit via Video Note  I connected with pt @ on 12/11/23 at 10:30 AM EDT by a video enabled telemedicine application and verified that I am speaking with the correct person using two identifiers.   I discussed the limitations of evaluation and management by telemedicine and the availability of in person appointments. The patient expressed understanding and agreed to proceed.  I discussed the assessment and treatment plan with the patient. The patient was provided an opportunity to ask questions and all were answered. The patient agreed with the plan and demonstrated an understanding of the instructions.   The patient was advised to call back or seek an in-person evaluation if the symptoms worsen or if the condition fails to improve as anticipated.  I provided 15 minutes of non-face-to-face time during this encounter.  The patient was located at home.  The provider was located at [facility].   Angeline LOISE Sayers, NP   Subjective:   Patient ID:  [name] is a 78 y.o. (DOB Feb 28, 1946) female.  Chief Complaint: No chief complaint on file.   HPI [name] presents for follow-up of MDD and insomnia.  Describes mood today as ok. Pleasant. Denies tearfulness. Mood symptoms - denies depression. Reports stable interest and motivation. Denies anxiety and irritability. Denies panic attacks. Denies worry, rumination and over thinking. Reports mood is stable. Stating I'm doing really good right now. Feels like current medications are working well. Taking medications as prescribed.  Energy levels improved. Active, trying to exercise more. Reports she enjoys some usual interests and activities. Married. Lives with husband and 2 Boston terriers. Has a son, age 41. Has one step daughter - 85. Spending time with family. Attends church.  Appetite adequate. Reports weight loss - 174 pounds. Sleeps well most nights.  Averages 10 hours. Denies daytime napping.    Focus and concentration stable. Completing tasks. Managing aspects of household. Retired. Denies SI or HI.  Denies AH or VH. Denies self harm. Denies substance use.  Previous medication trials:  Xanax , Zoloft , Wellbutrin , Valium , Ambien , Lexapro   Review of Systems:  Review of Systems  Musculoskeletal:  Negative for gait problem.  Neurological:  Negative for tremors.  Psychiatric/Behavioral:         Please refer to HPI    Medications: I have reviewed the patient's current medications.  Current Outpatient Medications  Medication Sig Dispense Refill   acetaminophen  (TYLENOL ) 500 MG tablet Take 500 mg by mouth as needed.     ARIPiprazole  (ABILIFY ) 2 MG tablet TAKE 1 TABLET BY MOUTH TWICE A DAY 60 tablet 2   ARIPiprazole  (ABILIFY ) 5 MG tablet Take 1 tablet (5 mg total) by mouth daily. 30 tablet 0   buPROPion  (WELLBUTRIN  XL) 300 MG 24 hr tablet Take 1 tablet (300 mg total) by mouth daily. 30 tablet 2   calcium -vitamin D  (OSCAL WITH D) 500-200 MG-UNIT per tablet Take 2 tablets by mouth in the morning.     carvedilol  (COREG ) 3.125 MG tablet TAKE 1 TABLET BY MOUTH TWICE A DAY WITH A MEAL 180 tablet 3   Cholecalciferol  (VITAMIN D ) 2000 UNITS tablet Take 4,000 Units by mouth in the morning.     colchicine  0.6 MG tablet Take 1 tablet (0.6 mg total) by mouth daily. 30 tablet 0   desvenlafaxine  (PRISTIQ ) 100 MG 24 hr tablet Take 1 tablet (100 mg total) by mouth daily. 90 tablet 3   digoxin  (LANOXIN ) 0.125 MG tablet TAKE 1  TABLET BY MOUTH DAILY 90 tablet 3   famotidine (PEPCID) 10 MG tablet Take 10 mg by mouth as needed for heartburn or indigestion.     ferrous sulfate  325 (65 FE) MG tablet Take 325 mg by mouth in the morning.     furosemide  (LASIX ) 80 MG tablet TAKE 1 TABLET BY MOUTH DAILY. OKAY TO TAKE EXTRA FOR WEIGHT GAIN> 3LB/ DAILY OR 5LB/WEEKLY 135 tablet 3   levothyroxine  (SYNTHROID , LEVOTHROID) 100 MCG tablet Take 100 mcg daily before  breakfast by mouth.     methylPREDNISolone  (MEDROL  DOSEPAK) 4 MG TBPK tablet Take as directed 21 each 0   Multiple Vitamin (MULTIVITAMIN WITH MINERALS) TABS Take 1 tablet by mouth in the morning.     oxymetazoline  (AFRIN) 0.05 % nasal spray Place 1 spray into both nostrils daily as needed for congestion.     potassium chloride  SA (KLOR-CON  M20) 20 MEQ tablet TAKE 2 TABLETS(40 MEQ) BY MOUTH DAILY 180 tablet 3   rosuvastatin  (CRESTOR ) 20 MG tablet TAKE 1 TABLET BY MOUTH EVERY DAY 90 tablet 3   sacubitril -valsartan  (ENTRESTO ) 97-103 MG TAKE 1 TABLET BY MOUTH TWICE A DAY 180 tablet 3   traZODone  (DESYREL ) 50 MG tablet Take 1 tablet (50 mg total) by mouth at bedtime as needed. 90 tablet 3   tretinoin  (RETIN-A ) 0.05 % cream Apply 1 application topically at bedtime. Applied to face     warfarin (COUMADIN ) 4 MG tablet TAKE 1 TABLET BY MOUTH EVERY DAY AS DIRECTED BY COUMADIN  CLINIC 90 tablet 0   No current facility-administered medications for this visit.    Medication Side Effects: None  Allergies:  Allergies  Allergen Reactions   Codeine Anaphylaxis    Jerking, involuntary jerking    Dilantin [Phenytoin Sodium Extended] Rash and Itching   Metoprolol  Other (See Comments)    depression    Propoxyphene Other (See Comments)    Bad vivid dreams    Past Medical History:  Diagnosis Date   Anxiety    Aortic stenosis    status post aortic valve replacement with St. 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   Depression    DJD (degenerative joint disease) of knee    Dyslipidemia    GERD (gastroesophageal reflux disease)    several years ago; none since (11/12/2012)   Hyperlipidemia    Hyperlipidemia LDL goal <70 01/19/2017   Hypertension    Left atrial enlargement    Mitral regurgitation 06/25/2016   Mild  moderate MR by echo 06/2016   Obesity    Persistent atrial fibrillation (HCC)    s/p afib ablation x 2 at [facility]   Sleep apnea    On CPAP at 12cm H2O   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 - now cleared by neurosurgery to maintain INR 2-2.5    Family History  Problem Relation Age of Onset   Lung cancer Mother    Hypertension Mother    Hyperlipidemia Father    Hypertension Father    Breast cancer Sister 68   Heart disease Brother    BRCA 1/2 Neg Hx     Social History   Socioeconomic History   Marital status: Married    Spouse name: Not on file   Number of children:  Not on file   Years of education: Not on file   Highest education level: Not on file  Occupational History   Not on file  Tobacco Use   Smoking status: Never   Smokeless tobacco: Never   Tobacco comments:    Never smoke 06/22/21  Substance and Sexual Activity   Alcohol use: No    Comment: 11/12/2012 no alcohol in the last 9 years   Drug use: No   Sexual activity: Not Currently  Other Topics Concern   Not on file  Social History Narrative   Pt lives in [place] with spouse.  Retired   Chief Executive Officer Drivers of Corporate investment banker Strain: Not on [employer] Insecurity: No Food Insecurity (03/23/2023)   Hunger Vital Sign    Worried About Running Out of Food in the Last Year: Never true    Ran Out of Food in the Last Year: Never true  Transportation Needs: No Transportation Needs (03/23/2023)   PRAPARE - Administrator, Civil Service (Medical): No    Lack of Transportation (Non-Medical): No  Physical Activity: Inactive (09/30/2019)   Exercise Vital Sign    Days of Exercise per Week: 0 days    Minutes of Exercise per Session: 0 min  Stress: Not on file  Social Connections: Unknown (09/30/2019)   Social Connection and Isolation Panel    Frequency of Communication with Friends and Family: Once a week    Frequency of Social Gatherings with Friends and  Family: Not on file    Attends Religious Services: 1 to 4 times per year    Active Member of [employer] or Organizations: No    Attends Banker Meetings: Never    Marital Status: Married  Catering manager Violence: Not At Risk (03/23/2023)   Humiliation, Afraid, Rape, and Kick questionnaire    Fear of Current or Ex-Partner: No    Emotionally Abused: No    Physically Abused: No    Sexually Abused: No    Past Medical History, Surgical history, Social history, and Family history were reviewed and updated as appropriate.   Please see review of systems for further details on the patient's review from today.   Objective:   Physical Exam:  There were no vitals taken for this visit.  Physical Exam Constitutional:      General: She is not in acute distress. Musculoskeletal:        General: No deformity.  Neurological:     Mental Status: She is alert and oriented to person, place, and time.     Coordination: Coordination normal.  Psychiatric:        Attention and Perception: Attention and perception normal. She does not perceive auditory or visual hallucinations.        Mood and Affect: Mood normal. Mood is not anxious or depressed. Affect is not labile, blunt, angry or inappropriate.        Speech: Speech normal.        Behavior: Behavior normal.        Thought Content: Thought content normal. Thought content is not paranoid or delusional. Thought content does not include homicidal or suicidal ideation. Thought content does not include homicidal or suicidal plan.        Cognition and Memory: Cognition and memory normal.        Judgment: Judgment normal.     Comments: Insight intact     Lab Review:     Component Value Date/Time   NA  139 03/24/2023 0807   NA 142 06/01/2022 1000   K 3.6 03/24/2023 0807   CL 103 03/24/2023 0807   CO2 27 03/24/2023 0807   GLUCOSE 111 (H) 03/24/2023 0807   BUN 24 (H) 03/24/2023 0807   BUN 21 06/01/2022 1000   CREATININE 1.12 (H)  03/24/2023 0807   CREATININE 0.82 12/28/2015 1000   CALCIUM  8.6 (L) 03/24/2023 0807   PROT 6.1 (L) 03/24/2023 0807   PROT 7.1 05/27/2018 1125   ALBUMIN 2.9 (L) 03/24/2023 0807   ALBUMIN 4.5 05/27/2018 1125   AST 46 (H) 03/24/2023 0807   ALT 36 03/24/2023 0807   ALKPHOS 72 03/24/2023 0807   BILITOT 0.8 03/24/2023 0807   BILITOT 0.4 05/27/2018 1125   GFRNONAA 51 (L) 03/24/2023 0807   GFRAA 82 02/05/2020 1120       Component Value Date/Time   WBC 9.7 03/27/2023 0626   RBC 4.05 03/27/2023 0626   HGB 12.3 03/27/2023 0626   HGB 11.7 09/28/2020 1017   HCT 37.0 03/27/2023 0626   HCT 34.6 09/28/2020 1017   PLT 299 03/27/2023 0626   PLT 207 09/28/2020 1017   MCV 91.4 03/27/2023 0626   MCV 92 09/28/2020 1017   MCH 30.4 03/27/2023 0626   MCHC 33.2 03/27/2023 0626   RDW 13.1 03/27/2023 0626   RDW 12.6 09/28/2020 1017   LYMPHSABS 1.0 03/24/2023 0807   LYMPHSABS 0.9 09/28/2020 1017   MONOABS 1.4 (H) 03/24/2023 0807   EOSABS 0.6 (H) 03/24/2023 0807   EOSABS 0.2 09/28/2020 1017   BASOSABS 0.0 03/24/2023 0807   BASOSABS 0.0 09/28/2020 1017    No results found for: POCLITH, LITHIUM   No results found for: PHENYTOIN, PHENOBARB, VALPROATE, CBMZ   .res Assessment: Plan:    Plan:  PDMP reviewed  Wellbutrin  XL 300 every morning. Denies seizure history. Denies hand tremors.  Trazadone 50mg  for sleep Pristiq  100mg  daily  Abilify  5mg  daily   Consider Vraylar  RTC 3 months  15 minutes spent dedicated to the care of this patient on the date of this encounter to include pre-visit review of records, ordering of medication, post visit documentation, and face-to-face time with the patient discussing depression. Discussed continuing current medication regimen.  Patient advised to contact office with any questions, adverse effects, or acute worsening in signs and symptoms.  Discussed potential metabolic side effects associated with atypical antipsychotics, as well as  potential risk for movement side effects. Advised pt to contact office if movement side effects occur.    There are no diagnoses linked to this encounter.   Please see After Visit Summary for patient specific instructions.  Future Appointments  Date Time Provider Department Center  12/11/2023 10:30 AM Taralynn Quiett, Angeline Mattocks, NP [facility] None  12/24/2023 10:00 AM [facility] COUMADIN  CLINIC 2 [facility] H&V  01/07/2024 10:15 AM Tobie Franky SQUIBB, DPM [facility]  01/29/2024 10:30 AM [facility] 4 [facility]-E[facility] H&V  01/31/2024  9:40 AM Croitoru, Mihai, MD [facility]&V    No orders of the defined types were placed in this encounter.     -------------------------------

## 2023-12-22 ENCOUNTER — Other Ambulatory Visit: Payer: Self-pay | Admitting: Cardiovascular Disease

## 2023-12-22 DIAGNOSIS — I480 Paroxysmal atrial fibrillation: Secondary | ICD-10-CM

## 2023-12-24 ENCOUNTER — Ambulatory Visit: Attending: Cardiovascular Disease

## 2023-12-24 DIAGNOSIS — I480 Paroxysmal atrial fibrillation: Secondary | ICD-10-CM

## 2023-12-24 DIAGNOSIS — Z5181 Encounter for therapeutic drug level monitoring: Secondary | ICD-10-CM

## 2023-12-24 DIAGNOSIS — Z7901 Long term (current) use of anticoagulants: Secondary | ICD-10-CM

## 2023-12-24 LAB — POCT INR: INR: 1.6 — AB (ref 2.0–3.0)

## 2023-12-24 NOTE — Progress Notes (Signed)
 Description   INR 1.6, Take 1.5 tablets today, then start taking 1 tablet daily, except 1/2 tablet on Fridays. Recheck INR in 3 weeks.   Coumadin  Clinic [phone]

## 2023-12-24 NOTE — Patient Instructions (Signed)
 Description   INR 1.6, Take 1.5 tablets today, then start taking 1 tablet daily, except 1/2 tablet on Fridays. Recheck INR in 3 weeks.   Coumadin  Clinic [phone]

## 2024-01-06 ENCOUNTER — Other Ambulatory Visit: Payer: Self-pay | Admitting: Cardiology

## 2024-01-07 ENCOUNTER — Ambulatory Visit (INDEPENDENT_AMBULATORY_CARE_PROVIDER_SITE_OTHER): Admitting: Podiatry

## 2024-01-07 DIAGNOSIS — M65972 Unspecified synovitis and tenosynovitis, left ankle and foot: Secondary | ICD-10-CM | POA: Diagnosis not present

## 2024-01-07 DIAGNOSIS — M10072 Idiopathic gout, left ankle and foot: Secondary | ICD-10-CM | POA: Diagnosis not present

## 2024-01-07 NOTE — Progress Notes (Signed)
 Subjective:  Patient ID: [name], female    DOB: 09/07/1945,  MRN: [id number]  Chief Complaint  Patient presents with   Routine Post Op    Est F/U from 12/05/23 visit. Gout is much better but now has burning in her heels especially at night     78 y.o. female presents with the above complaint.  Patient presents for follow-up on left midfoot red hot swollen joint.  She states her gout is doing better her pain is gone.  She just wanted to get it evaluated denies any other acute complaints  Review of Systems: Negative except as noted in the HPI. Denies N/V/F/Ch.  Past Medical History:  Diagnosis Date   Anxiety    Aortic stenosis    status post aortic valve replacement with St. 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   Depression    DJD (degenerative joint disease) of knee    Dyslipidemia    GERD (gastroesophageal reflux disease)    several years ago; none since (11/12/2012)   Hyperlipidemia    Hyperlipidemia LDL goal <70 01/19/2017   Hypertension    Left atrial enlargement    Mitral regurgitation 06/25/2016   Mild moderate MR by echo 06/2016   Obesity    Persistent atrial fibrillation (HCC)    s/p afib ablation x 2 at [facility]   Sleep apnea    On CPAP at 12cm H2O   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 - now cleared by neurosurgery to maintain INR 2-2.5    Current Outpatient Medications:    acetaminophen  (TYLENOL ) 500 MG tablet, Take 500 mg by mouth as needed., Disp: , Rfl:    ARIPiprazole  (ABILIFY ) 5 MG tablet, Take 1 tablet (5 mg total) by mouth daily., Disp: 90 tablet, Rfl: 0   buPROPion  (WELLBUTRIN  XL) 300 MG 24 hr tablet, Take 1 tablet (300 mg total) by mouth daily., Disp: 90 tablet, Rfl: 0   calcium -vitamin D  (OSCAL WITH D) 500-200 MG-UNIT per  tablet, Take 2 tablets by mouth in the morning., Disp: , Rfl:    carvedilol  (COREG ) 3.125 MG tablet, TAKE 1 TABLET BY MOUTH TWICE A DAY WITH A MEAL, Disp: 180 tablet, Rfl: 3   Cholecalciferol  (VITAMIN D ) 2000 UNITS tablet, Take 4,000 Units by mouth in the morning., Disp: , Rfl:    colchicine  0.6 MG tablet, Take 1 tablet (0.6 mg total) by mouth daily., Disp: 30 tablet, Rfl: 0   desvenlafaxine  (PRISTIQ ) 100 MG 24 hr tablet, Take 1 tablet (100 mg total) by mouth daily., Disp: 90 tablet, Rfl: 3   digoxin  (LANOXIN ) 0.125 MG tablet, TAKE 1 TABLET BY MOUTH DAILY, Disp: 90 tablet, Rfl: 3   famotidine (PEPCID) 10 MG tablet, Take 10 mg by mouth as needed for heartburn or indigestion., Disp: , Rfl:    ferrous sulfate  325 (65 FE) MG tablet, Take 325 mg by mouth in the morning., Disp: , Rfl:    furosemide  (LASIX ) 80 MG tablet, TAKE 1 TABLET BY MOUTH DAILY. OKAY TO TAKE EXTRA FOR WEIGHT GAIN> 3LB/ DAILY OR 5LB/WEEKLY, Disp: 135 tablet, Rfl: 3   levothyroxine  (SYNTHROID , LEVOTHROID) 100 MCG tablet, Take 100 mcg daily before breakfast by mouth., Disp: , Rfl:    methylPREDNISolone  (MEDROL  DOSEPAK) 4 MG TBPK  tablet, Take as directed, Disp: 21 each, Rfl: 0   Multiple Vitamin (MULTIVITAMIN WITH MINERALS) TABS, Take 1 tablet by mouth in the morning., Disp: , Rfl:    oxymetazoline  (AFRIN) 0.05 % nasal spray, Place 1 spray into both nostrils daily as needed for congestion., Disp: , Rfl:    potassium chloride  SA (KLOR-CON  M20) 20 MEQ tablet, TAKE 2 TABLETS(40 MEQ) BY MOUTH DAILY, Disp: 180 tablet, Rfl: 3   rosuvastatin  (CRESTOR ) 20 MG tablet, TAKE 1 TABLET BY MOUTH EVERY DAY, Disp: 90 tablet, Rfl: 3   sacubitril -valsartan  (ENTRESTO ) 97-103 MG, TAKE 1 TABLET BY MOUTH TWICE A DAY, Disp: 180 tablet, Rfl: 3   traZODone  (DESYREL ) 50 MG tablet, Take 1 tablet (50 mg total) by mouth at bedtime as needed., Disp: 90 tablet, Rfl: 3   tretinoin  (RETIN-A ) 0.05 % cream, Apply 1 application topically at bedtime. Applied to face, Disp: ,  Rfl:    warfarin (COUMADIN ) 4 MG tablet, Take 1/2 to 1 tablet by mouth once daily or as directed by Coumadin  Clinic, Disp: 100 tablet, Rfl: 0  Social History   Tobacco Use  Smoking Status Never  Smokeless Tobacco Never  Tobacco Comments   Never smoke 06/22/21    Allergies  Allergen Reactions   Codeine Anaphylaxis    Jerking, involuntary jerking    Dilantin [Phenytoin Sodium Extended] Rash and Itching   Metoprolol  Other (See Comments)    depression    Propoxyphene Other (See Comments)    Bad vivid dreams   Objective:  There were no vitals filed for this visit. There is no height or weight on file to calculate BMI. Constitutional Well developed. Well nourished.  Vascular Dorsalis pedis pulses palpable bilaterally. Posterior tibial pulses palpable bilaterally. Capillary refill normal to all digits.  No cyanosis or clubbing noted. Pedal hair growth normal.  Neurologic Normal speech. Oriented to person, place, and time. Epicritic sensation to light touch grossly present bilaterally.  Dermatologic Nails well groomed and normal in appearance. No open wounds. No skin lesions.  Orthopedic: No further pain on palpation left dorsal midfoot no further red hot swollen joint noted consistent with gouty flare.  Clinically able appreciate some arthritic changes.  No open wounds or lesion noted no signs of infection noted   Radiographs: None Assessment:   1. Acute idiopathic gout of left foot   2. Synovitis of left foot     Plan:  Patient was evaluated and treated and all questions answered.  Left dorsal midfoot gout flare with underlying synovitis - All questions and concerns were discussed with the patient extensive detail - Clinically healed and officially discharged from my care if any foot and ankle issues are future she will come back and see me.  Encouraged her to continue watching her diet.  She states she will.   No follow-ups on file.

## 2024-01-15 ENCOUNTER — Ambulatory Visit

## 2024-01-18 DIAGNOSIS — M25562 Pain in left knee: Secondary | ICD-10-CM | POA: Diagnosis not present

## 2024-01-18 DIAGNOSIS — R0789 Other chest pain: Secondary | ICD-10-CM | POA: Diagnosis not present

## 2024-01-21 ENCOUNTER — Ambulatory Visit

## 2024-01-22 ENCOUNTER — Other Ambulatory Visit: Payer: Self-pay | Admitting: Cardiovascular Disease

## 2024-01-24 ENCOUNTER — Other Ambulatory Visit: Payer: Self-pay | Admitting: Adult Health

## 2024-01-24 DIAGNOSIS — F331 Major depressive disorder, recurrent, moderate: Secondary | ICD-10-CM

## 2024-01-29 ENCOUNTER — Ambulatory Visit (HOSPITAL_COMMUNITY)
Admission: RE | Admit: 2024-01-29 | Discharge: 2024-01-29 | Disposition: A | Discharge status: Home / Self Care | Payer: Medicare Other | Source: Ambulatory Visit | Attending: Cardiology | Admitting: Cardiology

## 2024-01-29 DIAGNOSIS — Z952 Presence of prosthetic heart valve: Secondary | ICD-10-CM | POA: Insufficient documentation

## 2024-01-29 DIAGNOSIS — G4730 Sleep apnea, unspecified: Secondary | ICD-10-CM | POA: Diagnosis not present

## 2024-01-29 DIAGNOSIS — I081 Rheumatic disorders of both mitral and tricuspid valves: Secondary | ICD-10-CM | POA: Insufficient documentation

## 2024-01-29 DIAGNOSIS — I110 Hypertensive heart disease with heart failure: Secondary | ICD-10-CM | POA: Insufficient documentation

## 2024-01-29 DIAGNOSIS — I340 Nonrheumatic mitral (valve) insufficiency: Secondary | ICD-10-CM | POA: Diagnosis not present

## 2024-01-29 DIAGNOSIS — I4891 Unspecified atrial fibrillation: Secondary | ICD-10-CM | POA: Diagnosis not present

## 2024-01-29 DIAGNOSIS — E785 Hyperlipidemia, unspecified: Secondary | ICD-10-CM | POA: Insufficient documentation

## 2024-01-29 DIAGNOSIS — I77810 Thoracic aortic ectasia: Secondary | ICD-10-CM | POA: Diagnosis not present

## 2024-01-29 DIAGNOSIS — I509 Heart failure, unspecified: Secondary | ICD-10-CM | POA: Diagnosis not present

## 2024-01-29 LAB — ECHOCARDIOGRAM COMPLETE
AR max vel: 1.04 cm2
AV Area VTI: 1.28 cm2
AV Area mean vel: 1.17 cm2
AV Mean grad: 9 mmHg
AV Peak grad: 16.8 mmHg
Ao pk vel: 2.05 m/s
Area-P 1/2: 3.38 cm2
MV M vel: 5.12 m/s
MV Peak grad: 104.9 mmHg
Radius: 0.75 cm
S' Lateral: 3.7 cm

## 2024-01-30 ENCOUNTER — Ambulatory Visit: Payer: Self-pay | Admitting: Cardiovascular Disease

## 2024-01-31 ENCOUNTER — Telehealth: Payer: Self-pay | Admitting: Emergency Medicine

## 2024-01-31 ENCOUNTER — Ambulatory Visit: Admitting: Pharmacist

## 2024-01-31 ENCOUNTER — Encounter: Payer: Self-pay | Admitting: Cardiovascular Disease

## 2024-01-31 ENCOUNTER — Ambulatory Visit: Attending: Cardiovascular Disease | Admitting: Cardiovascular Disease

## 2024-01-31 VITALS — BP 120/73 | HR 74 | Resp 17 | Ht 64.0 in | Wt 176.0 lb

## 2024-01-31 DIAGNOSIS — Z7901 Long term (current) use of anticoagulants: Secondary | ICD-10-CM

## 2024-01-31 DIAGNOSIS — E7800 Pure hypercholesterolemia, unspecified: Secondary | ICD-10-CM | POA: Diagnosis not present

## 2024-01-31 DIAGNOSIS — D6869 Other thrombophilia: Secondary | ICD-10-CM | POA: Diagnosis not present

## 2024-01-31 DIAGNOSIS — Z952 Presence of prosthetic heart valve: Secondary | ICD-10-CM

## 2024-01-31 DIAGNOSIS — I77810 Thoracic aortic ectasia: Secondary | ICD-10-CM

## 2024-01-31 DIAGNOSIS — Z5181 Encounter for therapeutic drug level monitoring: Secondary | ICD-10-CM

## 2024-01-31 DIAGNOSIS — I480 Paroxysmal atrial fibrillation: Secondary | ICD-10-CM

## 2024-01-31 DIAGNOSIS — G4733 Obstructive sleep apnea (adult) (pediatric): Secondary | ICD-10-CM | POA: Diagnosis not present

## 2024-01-31 DIAGNOSIS — I5032 Chronic diastolic (congestive) heart failure: Secondary | ICD-10-CM | POA: Diagnosis not present

## 2024-01-31 DIAGNOSIS — I340 Nonrheumatic mitral (valve) insufficiency: Secondary | ICD-10-CM | POA: Diagnosis not present

## 2024-01-31 DIAGNOSIS — Z79899 Other long term (current) drug therapy: Secondary | ICD-10-CM

## 2024-01-31 DIAGNOSIS — I700 Atherosclerosis of aorta: Secondary | ICD-10-CM

## 2024-01-31 DIAGNOSIS — E785 Hyperlipidemia, unspecified: Secondary | ICD-10-CM

## 2024-01-31 LAB — POCT INR: INR: 2.4 (ref 2.0–3.0)

## 2024-01-31 NOTE — Patient Instructions (Signed)
 Description   INR 2.4, Continue taking 1 tablet daily, except 1/2 tablet on Fridays. Recheck INR in 4 weeks.   Coumadin  Clinic [phone]

## 2024-01-31 NOTE — Patient Instructions (Signed)
 Medication Instructions:  No changes *If you need a refill on your cardiac medications before your next appointment, please call your pharmacy*  Lab Work: None ordered  Testing/Procedures: Your physician has requested that you have an echocardiogram in one year prior to your follow up appointment with Dr Francyne. Echocardiography is a painless test that uses sound waves to create images of your heart. It provides your doctor with information about the size and shape of your heart and how well your heart's chambers and valves are working. This procedure takes approximately one hour. There are no restrictions for this procedure. Please do NOT wear cologne, perfume, aftershave, or lotions (deodorant is allowed). Please arrive 15 minutes prior to your appointment time.  Please note: We ask at that you not bring children with you during ultrasound (echo/ vascular) testing. Due to room size and safety concerns, children are not allowed in the ultrasound rooms during exams. Our front office staff cannot provide observation of children in our lobby area while testing is being conducted. An adult accompanying a patient to their appointment will only be allowed in the ultrasound room at the discretion of the ultrasound technician under special circumstances. We apologize for any inconvenience.   Follow-Up: At [facility], you and your health needs are our priority.  As part of our continuing mission to provide you with exceptional heart care, our providers are all part of one team.  This team includes your primary Cardiologist (physician) and Advanced Practice Providers or APPs (Physician Assistants and Nurse Practitioners) who all work together to provide you with the care you need, when you need it.  Your next appointment:   1 year(s) (after Echo)  Provider:   Dr Francyne   We recommend signing up for the patient portal called MyChart.  Sign up information is provided on this After Visit  Summary.  MyChart is used to connect with patients for Virtual Visits (Telemedicine).  Patients are able to view lab/test results, encounter notes, upcoming appointments, etc.  Non-urgent messages can be sent to your provider as well.   To learn more about what you can do with MyChart, go to [email or web address].

## 2024-01-31 NOTE — Progress Notes (Signed)
 Description   INR 2.4, Continue taking 1 tablet daily, except 1/2 tablet on Fridays. Recheck INR in 4 weeks.   Coumadin  Clinic [phone]

## 2024-01-31 NOTE — Progress Notes (Signed)
 Cardiology Office Note:    Date:  01/31/2024   ID:  [name], DOB 1945/10/27, MRN [id number]  PCP:  Chet Mad, DO  [facility] Cardiologist:  Canaan Holzer [facility] Electrophysiologist:  None   Referring MD: Chet Mad, DO   Chief Complaint  Patient presents with   Chronic diastolic CHF (congestive heart failure) (HCC)   Follow-up    1 year     History of Present Illness:    [name] is a 78 y.o. 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  Unfortunately she had a fall climbing over a step at a neighborhood party.  This was not preceded by dizziness or syncope.  She fell and cracked 2 of her left-sided ribs.  This happened about 3 weeks ago and she is still sore if she tries to turn or take a deep breath.  Thankfully she did not have any other serious injuries or head impact.  She reports that until that fall she was doing really really well.  She has not had problems with chest pain with activity or shortness of breath at rest or with activity.  Denies edema, orthopnea, PND, palpitations, dizziness or syncope.  Daytime hypersomnolence but is less of a complaint today.  She has lost 44 pounds since her peak weight of 220 pounds and her BMI is right at 30 today.  Renal function parameters have been steady with a creatinine around 1.1, GFR  around 50 and normal electrolytes.  Last digoxin  level was checked in October 2024.  With a single exception in September her INR has been in therapeutic range, including today at 2.4  We did not perform an ECG today, but she is clearly in atrial fibrillation and has a well-controlled ventricular rate in the 70s.  Her digoxin  level has been very stable at 0.8-0.9, most recently checked about 3 months ago.  With the exception of a brief drop in INR in December, her anticoagulation level has almost always been in therapeutic range. Her most recent echocardiogram performed in October 2024 continues to show normal left ventricular size and systolic function, normal gradients across her mechanical aortic valve prosthesis, central mitral insufficiency that in my opinion is moderate-severe, probably atrial functional MR, unchanged PA pressure.  Previously poorly tolerant of metoprolol , which seemed to cause depression, so used carvedilol .    She was most recently hospitalized in June 2021 with acute heart failure exacerbation and her echocardiogram showed decreased LVEF to 30-35% as well as moderately reduced right ventricular systolic function and severely elevated pulmonary artery hypertension, despite normal function of the aortic valve prosthesis.  The cause of LVEF decline was uncertain, but she responded well to discontinuation of diltiazem  and treatment with diuretics, Entresto , carvedilol  (the doses have been limited by low blood pressure).  In the past she reportedly did not tolerate beta-blockers due to depression, but the current low-dose of carvedilol  seems to be working well for her. Follow-up echocardiogram performed in October 2021 showed only partial improvement in LVEF now  up to 40-45% in the left ventricle remain dilated with an end-systolic diameter of 49 mm.  Mitral regurgitation remains severe with a central jet.  She underwent a TEE on 07/12/2020 that shows further improvement in LVEF which  is now almost normal at 50-55%.  It also showed that the mitral regurgitation is central and squarely in the moderate to severe range (there was no reversal of flow in the pulmonary veins, all calculated parameters suggested higher and of moderate MR).  Anatomically, the valve appears appropriate for MitraClip, without major leaflet retraction and with a mean gradient of 3 mmHg at a heart rate around 100 bpm.  She underwent right and left heart catheterization on 10/14/2020.  This showed excellent hemodynamic values and normal pulmonary artery pressure.  The PAWP was only 10 mmHg and the V wave was not prominent.  It appeared that she did not require treatment for the mitral insufficiency at that point.  Follow-up transthoracic echocardiogram performed 02/07/2021 shows complete normalization of LVEF to 55%, moderate to severe mitral regurgitation, normal parameters of the mechanical aortic valve prosthesis with a mean gradient of 10 mmHg.  Pulmonary function test performed in May 2021 were abnormal with restrictive findings: FEV1 of 39% of predicted, commensurate FVC 40% of predicted.  Her pulmonary specialist is Dr. Neda. She reports compliance with CPAP.    Past Medical History:  Diagnosis Date   Anxiety    Aortic stenosis    status post aortic valve replacement with St. 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   Depression    DJD (degenerative joint disease) of knee    Dyslipidemia    GERD (gastroesophageal reflux disease)    several years ago; none since (11/12/2012)   Hyperlipidemia    Hyperlipidemia LDL goal <70 01/19/2017   Hypertension    Left atrial enlargement    Mitral regurgitation 06/25/2016   Mild moderate MR by echo 06/2016   Obesity    Persistent atrial fibrillation (HCC)    s/p afib ablation x 2  at [facility]   Sleep apnea    On CPAP at 12cm H2O   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 Jude AVR   CARDIOVERSION N/A 07/22/2012   Procedure: CARDIOVERSION;  Surgeon: Oneil Parchment, MD;  Location: [facility] ENDOSCOPY;  Service: Cardiovascular;  Laterality: N/A;   CARDIOVERSION N/A 11/25/2012   Procedure: CARDIOVERSION;  Surgeon: Wilbert JONELLE Bihari, MD;  Location: [facility] ENDOSCOPY;  Service: Cardiovascular;  Laterality: N/A;   CARDIOVERSION N/A 12/30/2012   Procedure: CARDIOVERSION;  Surgeon: Wilbert JONELLE Bihari, MD;  Location: [facility] ENDOSCOPY;  Service: Cardiovascular;  Laterality: N/A;  h&p in file-HW    CARDIOVERSION N/A 03/30/2013   Procedure: CARDIOVERSION;  Surgeon: Wilbert JONELLE Bihari, MD;  Location: [facility] ENDOSCOPY;  Service: Cardiovascular;  Laterality: N/A;   ELECTROPHYSIOLOGIC STUDY  11/2013   Afib ablation x 2 (11/2013 and 10/2015) at [facility] by Dr Bonnie with recurrence post ablation   KNEE ARTHROSCOPY Left 1980's   2 (11/12/2012)   KNEE SURGERY Left 1980's   after  2 scopes they went in and did some kind of OR (11/12/2012)   RIGHT/LEFT HEART CATH AND CORONARY ANGIOGRAPHY N/A 10/14/2020   Procedure: RIGHT/LEFT HEART CATH AND CORONARY ANGIOGRAPHY;  Surgeon: Swaziland, Peter M, MD;  Location: [facility] INVASIVE CV LAB;  Service: Cardiovascular;  Laterality: N/A;   TEE WITHOUT CARDIOVERSION N/A 07/22/2012   Procedure: TRANSESOPHAGEAL ECHOCARDIOGRAM (TEE);  Surgeon: Oneil Parchment, MD;  Location: [facility] ENDOSCOPY;  Service: Cardiovascular;  Laterality: N/A;  Rm 2034   TEE WITHOUT CARDIOVERSION N/A 10/07/2013   Procedure: TRANSESOPHAGEAL ECHOCARDIOGRAM (TEE);  Surgeon: Oneil Parchment, MD;  Location: [facility] ENDOSCOPY;  Service: Cardiovascular;  Laterality: N/A;   TEE WITHOUT CARDIOVERSION N/A 07/12/2020   Procedure: TRANSESOPHAGEAL  ECHOCARDIOGRAM (TEE);  Surgeon: Francyne Headland, MD;  Location: [facility] ENDOSCOPY;  Service: Cardiovascular;  Laterality: N/A;   TOTAL KNEE ARTHROPLASTY Left 11/05/2014   Procedure: TOTAL KNEE ARTHROPLASTY;  Surgeon: Norleen Gavel, MD;  Location: [facility] OR;  Service: Orthopedics;  Laterality: Left;   TUBAL LIGATION  1980's    Current Medications: Current Meds  Medication Sig   acetaminophen  (TYLENOL ) 500 MG tablet Take 500 mg by mouth as needed.   ARIPiprazole  (ABILIFY ) 5 MG tablet Take 1 tablet (5 mg total) by mouth daily.   buPROPion  (WELLBUTRIN  XL) 300 MG 24 hr tablet TAKE 1 TABLET BY MOUTH EVERY DAY   calcium -vitamin D  (OSCAL WITH D) 500-200 MG-UNIT per tablet Take 2 tablets by mouth in the morning.   carvedilol  (COREG ) 3.125 MG tablet TAKE 1 TABLET BY MOUTH TWICE A DAY WITH A MEAL   Cholecalciferol  (VITAMIN D ) 2000 UNITS tablet Take 4,000 Units by mouth in the morning.   desvenlafaxine  (PRISTIQ ) 100 MG 24 hr tablet Take 1 tablet (100 mg total) by mouth daily.   digoxin  (LANOXIN ) 0.125 MG tablet TAKE 1 TABLET BY MOUTH DAILY   ENTRESTO  97-103 MG TAKE 1 TABLET BY MOUTH TWICE A DAY   famotidine (PEPCID) 10 MG tablet Take 10 mg by mouth as needed for heartburn or indigestion.   ferrous sulfate  325 (65 FE) MG tablet Take 325 mg by mouth in the morning.   furosemide  (LASIX ) 80 MG tablet TAKE 1 TABLET BY MOUTH DAILY. OKAY TO TAKE EXTRA FOR WEIGHT GAIN> 3LB/ DAILY OR 5LB/WEEKLY   levothyroxine  (SYNTHROID , LEVOTHROID) 100 MCG tablet Take 100 mcg daily before breakfast by mouth.   Multiple Vitamin (MULTIVITAMIN WITH MINERALS) TABS Take 1 tablet by mouth in the morning.   oxymetazoline  (AFRIN) 0.05 % nasal spray Place 1 spray into both nostrils daily as needed for congestion.   potassium chloride  SA (KLOR-CON  M20) 20 MEQ tablet TAKE 2 TABLETS(40 MEQ) BY MOUTH DAILY   rosuvastatin  (CRESTOR ) 20 MG tablet TAKE 1 TABLET BY MOUTH EVERY DAY   traZODone  (DESYREL ) 50 MG tablet Take 1 tablet (50 mg total) by mouth at  bedtime as needed.   tretinoin  (RETIN-A ) 0.05 % cream Apply 1 application topically at bedtime. Applied to face   warfarin (COUMADIN ) 4 MG tablet Take 1/2 to 1 tablet by mouth once daily or as directed by Coumadin  Clinic     Allergies:   Codeine, Dilantin [phenytoin sodium extended], Metoprolol , and Propoxyphene   Social History   Socioeconomic History   Marital status: Married    Spouse name: Not on file   Number of children: 1   Years of education: Not on file   Highest education level: Not on file  Occupational History   Not on file  Tobacco Use   Smoking status: Never  Smokeless tobacco: Never   Tobacco comments:    Never smoke 06/22/21  Vaping Use   Vaping status: Never Used  Substance and Sexual Activity   Alcohol use: No    Comment: 11/12/2012 no alcohol in the last 9 years   Drug use: No   Sexual activity: Not Currently  Other Topics Concern   Not on file  Social History Narrative   Pt lives in [place] with spouse.  Retired   Chief Executive Officer Drivers of Corporate investment banker Strain: Not on [employer] Insecurity: No Food Insecurity (03/23/2023)   Hunger Vital Sign    Worried About Running Out of Food in the Last Year: Never true    Ran Out of Food in the Last Year: Never true  Transportation Needs: No Transportation Needs (03/23/2023)   PRAPARE - Administrator, Civil Service (Medical): No    Lack of Transportation (Non-Medical): No  Physical Activity: Inactive (09/30/2019)   Exercise Vital Sign    Days of Exercise per Week: 0 days    Minutes of Exercise per Session: 0 min  Stress: Not on file  Social Connections: Unknown (09/30/2019)   Social Connection and Isolation Panel    Frequency of Communication with Friends and Family: Once a week    Frequency of Social Gatherings with Friends and Family: Not on file    Attends Religious Services: 1 to 4 times per year    Active Member of [employer] or Organizations: No    Attends Banker  Meetings: Never    Marital Status: Married     Family History: The patient's family history includes Breast cancer (age of onset: 36) in her sister; Heart disease in her brother; Hyperlipidemia in her father; Hypertension in her father and mother; Lung cancer in her mother. There is no history of BRCA 1/2.  ROS:   Please see the history of present illness.     All other systems reviewed and are negative.  EKGs/Labs/Other Studies Reviewed:    The following studies were reviewed today: Transthoracic echo 02/01/2023   1. Left ventricular ejection fraction, by estimation, is 55%. The left  ventricle has normal function. The left ventricle has no regional wall  motion abnormalities. Left ventricular diastolic parameters are  indeterminate.   2. Right ventricular systolic function is mildly reduced. The right  ventricular size is normal. There is mildly elevated pulmonary artery  systolic pressure. The estimated right ventricular systolic pressure is  38.0 mmHg.   3. Left atrial size was severely dilated.   4. Right atrial size was severely dilated.   5. The mitral valve is degenerative. Severe mitral valve regurgitation,  possible atrial functional MR. Moderate mitral annular calcification.   6. There is a Futures trader aortic valve. Mean gradient 8 mmHg, no  significant perivalvular leakage.   7. Aortic dilatation noted. There is mild dilatation of the aortic root,  measuring 38 mm. There is moderate dilatation of the ascending aorta,  measuring 44 mm.   8. The inferior vena cava is normal in size with greater than 50%  respiratory variability, suggesting right atrial pressure of 3 mmHg.   9. The patient was in atrial fibrillation.   EKG: Ordered today shows atrial fibrillation with controlled ventricular response, RBBB (Chronic)  EKG Interpretation Date/Time:  Friday January 31 2024 09:49:38 EDT Ventricular Rate:  72 PR Interval:    QRS Duration:  172 QT  Interval:  468 QTC Calculation: 512 R Axis:  6  Text Interpretation: Atrial fibrillation Right bundle branch block T wave abnormality, consider inferior ischemia When compared with ECG of 23-Mar-2023 17:14, T wave inversion now evident in Inferior leads QT has lengthened Confirmed by Treyson Axel [phone]) on 01/31/2024 1:23:38 PM         Recent Labs: 03/23/2023: B Natriuretic Peptide 205.5 03/24/2023: ALT 36; BUN 24; Creatinine, Ser 1.12; Magnesium  2.1; Potassium 3.6; Sodium 139 03/27/2023: Hemoglobin 12.3; Platelets 299    Recent Lipid Panel    Component Value Date/Time   CHOL 170 05/24/2021 1054   TRIG 94 05/24/2021 1054   HDL 64 05/24/2021 1054   CHOLHDL 2.7 05/24/2021 1054   LDLCALC 89 05/24/2021 1054    01/30/2023 Cholesterol 146, HDL 41, LDL 78, triglycerides 150 Hemoglobin 13, potassium 4.0, ALT 14, TSH 2.83 (most recent creatinine 1.0 from 06/01/2022)  Physical Exam:    VS:  BP 120/73 (BP Location: Left Arm, Patient Position: Sitting, Cuff Size: Large)   Pulse 74   Resp 17   Ht 5' 4 (1.626 m)   Wt 176 lb (79.8 kg)   SpO2 98%   BMI 30.21 kg/m     Wt Readings from Last 3 Encounters:  01/31/24 176 lb (79.8 kg)  06/04/23 185 lb 3.2 oz (84 kg)  03/26/23 189 lb 2.5 oz (85.8 kg)     General: Alert, oriented x3, no distress, appears well.  Borderline obese. Head: no evidence of trauma, PERRL, EOMI, no exophtalmos or lid lag, no myxedema, no xanthelasma; normal ears, nose and oropharynx Neck: normal jugular venous pulsations and no hepatojugular reflux; brisk carotid pulses without delay and no carotid bruits Chest: clear to auscultation, no signs of consolidation by percussion or palpation, normal fremitus, symmetrical and full respiratory excursions Cardiovascular: normal position and quality of the apical impulse, regular rhythm, crisp mechanical valve sounds, short aortic ejection murmur, no apical systolic murmur or diastolic murmurs, rubs or  gallops Abdomen: no tenderness or distention, no masses by palpation, no abnormal pulsatility or arterial bruits, normal bowel sounds, no hepatosplenomegaly Extremities: no clubbing, cyanosis or edema; 2+ radial, ulnar and brachial pulses bilaterally; 2+ right femoral, posterior tibial and dorsalis pedis pulses; 2+ left femoral, posterior tibial and dorsalis pedis pulses; no subclavian or femoral bruits Neurological: grossly nonfocal Psych: Normal mood and affect  ASSESSMENT:    1. Chronic diastolic CHF (congestive heart failure) (HCC)   2. Non-rheumatic mitral regurgitation   3. Paroxysmal atrial fibrillation (HCC)   4. Encounter for monitoring digoxin  therapy   5. H/O mechanical aortic valve replacement   6. Acquired thrombophilia   7. OSA on CPAP   8. Ascending aorta dilatation   9. Aortic atherosclerosis   10. 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   She does not have any signs or symptoms of coronary disease or any perfusion abnormalities on the nuclear stress test from May 2021.  Continue treatment with Entresto , carvedilol  (did not tolerate higher doses of carvedilol ).  With current good functional response I do not think we need to add SGLT2 inhibitor. MR: Remains asymptomatic.  Continue to follow this with yearly echo.  The severity of MR has been estimated at different degrees on serial studies.  I believe that it has consistently been moderate MR, likely atrial functional.  I cannot hear the murmur today, the  MR jet is central is associated with pulmonary vein systolic forward flow on echo.  At cardiac catheterization LV filling pressures were very low.  She has normal PA pressure.  Anatomically, she does have appropriate findings for MitraClip should this become necessary in the  future.  Continue yearly follow-up clinically and with echo to screen for signs of LV dilation or decrease in EF. AFib: Now with permanent arrhythmia, despite  2 previous ablation procedures,  failure on amiodarone  and dofetilide .  Well rate-controlled and fully anticoagulated.  Often has a relatively regular rhythm suggesting a competing junctional pacemaker.   Digoxin : No signs or symptoms of toxicity.  She did not do well with higher doses of carvedilol  or other beta-blockers for rate control and seems to feel better on digoxin .  Time to recheck a digoxin  level. Mech AVR: Stable gradients across the valve on serial echoes (mean gradient 9 mmHg for size 19 St. Jude [employer] dual leaflet).   Anticoagulation: INR is almost always in therapeutic range, no serious bleeding problems.  Had just the 1 to fall, thankfully without head injury or serious bleeding complications.  She does have a history of serious bleeding complications on 2 occasions (subdural hematoma 2006 and rectus sheath tumor 2019).  Occasional epistaxis but otherwise no serious bleeding problems since then. OSA: Reports compliance with CPAP. Restrictive lung disease: No change in dyspnea.  PFTs from 2018 through 2021 showed reductions in FVC of 40-56% of predicted.  This may be a significant part of her chronic dyspnea.  Fairly significant reduction in FVC, at least in part attributable to obesity.  Also has moderate thoracic dextroscoliosis.  Follow-up with Dr. Neda. Asc Ao dilation: Stable diameter at 4.3 cm on echo performed earlier this month, asymptomatic.  Will follow this with echo since the measurements match pretty well with CT and just get a CT every 3-5 years.  Stable in size by CT and by recent echo (by echo 4.4 cm, by. CT scan in 2017 4.3 cm, CT  07/13/2021 shows exactly the same measurement of 4.3 cm).  Probably will get a CT next year. Aortic atherosclerosis: Mild atherosclerosis of the thoracic aorta on the most recent CT  April 2023, and abdominal aortic atherosclerosis is described on CT from 2019.  No history of clinically relevant CAD. HLP: Ideally target LDL less than 70, but in the absence of clinically evident CAD or PAD I think a target less than 100 remains appropriate.  Continue statin   Medication Adjustments/Labs and Tests Ordered: Current medicines are reviewed at length with the patient today.  Concerns regarding medicines are outlined above.  Orders Placed This Encounter  Procedures   EKG 12-Lead   ECHOCARDIOGRAM COMPLETE     No orders of the defined types were placed in this encounter.    Patient Instructions  Medication Instructions:  No changes *If you need a refill on your cardiac medications before your next appointment, please call your pharmacy*  Lab Work: None ordered  Testing/Procedures: Your physician has requested that you have an echocardiogram in one year prior to your follow up appointment with Dr Francyne. Echocardiography is a painless test that uses sound waves to create images of your heart. It provides your doctor with information about the size and shape of your heart and how well your heart's chambers and valves are working. This procedure takes approximately one hour. There are no restrictions for this procedure. Please do NOT wear cologne, perfume, aftershave, or lotions (deodorant is allowed). Please arrive  15 minutes prior to your appointment time.  Please note: We ask at that you not bring children with you during ultrasound (echo/ vascular) testing. Due to room size and safety concerns, children are not allowed in the ultrasound rooms during exams. Our front office staff cannot provide observation of children in our lobby area while testing is being conducted. An adult accompanying a patient to their appointment will only be allowed in the ultrasound room at the discretion of the ultrasound technician under special circumstances. We apologize for any  inconvenience.   Follow-Up: At [facility], you and your health needs are our priority.  As part of our continuing mission to provide you with exceptional heart care, our providers are all part of one team.  This team includes your primary Cardiologist (physician) and Advanced Practice Providers or APPs (Physician Assistants and Nurse Practitioners) who all work together to provide you with the care you need, when you need it.  Your next appointment:   1 year(s) (after Echo)  Provider:   Dr Francyne   We recommend signing up for the patient portal called MyChart.  Sign up information is provided on this After Visit Summary.  MyChart is used to connect with patients for Virtual Visits (Telemedicine).  Patients are able to view lab/test results, encounter notes, upcoming appointments, etc.  Non-urgent messages can be sent to your provider as well.   To learn more about what you can do with MyChart, go to [email or web address].      Signed, Jerel Francyne, MD  01/31/2024 1:32 PM    [facility]

## 2024-01-31 NOTE — Telephone Encounter (Signed)
 Left message over voicemail stating that Dr Francyne was reviewing chart and she needs some Lab work. This lab work can be completed the morning she comes back for her next Coumadin  clinic appointment. Just stop by the lab first, then go to your appointment. You will need to be fasting- nothing to eat or drink after midnight- water is ok. Placing lab orders, you will not need a lab slip.   Orders placed for CBC, Lipid panel, CMP, ProBNP, Digoxin  level  MyChart message sent as well

## 2024-02-03 DIAGNOSIS — B070 Plantar wart: Secondary | ICD-10-CM | POA: Diagnosis not present

## 2024-02-03 DIAGNOSIS — Z85828 Personal history of other malignant neoplasm of skin: Secondary | ICD-10-CM | POA: Diagnosis not present

## 2024-02-03 DIAGNOSIS — L308 Other specified dermatitis: Secondary | ICD-10-CM | POA: Diagnosis not present

## 2024-02-03 DIAGNOSIS — D1801 Hemangioma of skin and subcutaneous tissue: Secondary | ICD-10-CM | POA: Diagnosis not present

## 2024-02-03 DIAGNOSIS — L821 Other seborrheic keratosis: Secondary | ICD-10-CM | POA: Diagnosis not present

## 2024-02-09 ENCOUNTER — Other Ambulatory Visit (HOSPITAL_BASED_OUTPATIENT_CLINIC_OR_DEPARTMENT_OTHER): Payer: Self-pay | Admitting: Family Medicine

## 2024-02-09 DIAGNOSIS — Z780 Asymptomatic menopausal state: Secondary | ICD-10-CM

## 2024-02-14 ENCOUNTER — Ambulatory Visit: Admitting: Podiatry

## 2024-02-14 DIAGNOSIS — Z91199 Patient's noncompliance with other medical treatment and regimen due to unspecified reason: Secondary | ICD-10-CM

## 2024-02-14 NOTE — Progress Notes (Signed)
 1. No-show for appointment

## 2024-02-28 ENCOUNTER — Ambulatory Visit: Attending: Cardiovascular Disease | Admitting: *Deleted

## 2024-02-28 DIAGNOSIS — I480 Paroxysmal atrial fibrillation: Secondary | ICD-10-CM | POA: Diagnosis not present

## 2024-02-28 DIAGNOSIS — Z5181 Encounter for therapeutic drug level monitoring: Secondary | ICD-10-CM | POA: Diagnosis not present

## 2024-02-28 DIAGNOSIS — Z7901 Long term (current) use of anticoagulants: Secondary | ICD-10-CM | POA: Diagnosis not present

## 2024-02-28 LAB — CBC

## 2024-02-28 LAB — POCT INR: INR: 1.8 — AB (ref 2.0–3.0)

## 2024-02-28 NOTE — Progress Notes (Signed)
 Description   INR-1.8; Today take 1 tablet of warfarin then continue taking 1 tablet daily, except 1/2 tablet on Fridays. Recheck INR in 3 weeks.   Coumadin  Clinic [phone]

## 2024-02-28 NOTE — Patient Instructions (Signed)
 Description   INR-1.8; Today take 1 tablet of warfarin then continue taking 1 tablet daily, except 1/2 tablet on Fridays. Recheck INR in 3 weeks.   Coumadin  Clinic [phone]

## 2024-02-29 ENCOUNTER — Ambulatory Visit: Payer: Self-pay | Admitting: Cardiovascular Disease

## 2024-02-29 LAB — CBC
Hematocrit: 37.5 % (ref 34.0–46.6)
Hemoglobin: 12.5 g/dL (ref 11.1–15.9)
MCH: 31.3 pg (ref 26.6–33.0)
MCHC: 33.3 g/dL (ref 31.5–35.7)
MCV: 94 fL (ref 79–97)
Platelets: 256 x10E3/uL (ref 150–450)
RBC: 3.99 x10E6/uL (ref 3.77–5.28)
RDW: 12.8 % (ref 11.7–15.4)
WBC: 5.5 x10E3/uL (ref 3.4–10.8)

## 2024-02-29 LAB — LIPID PANEL
Chol/HDL Ratio: 4.7 ratio — ABNORMAL HIGH (ref 0.0–4.4)
Cholesterol, Total: 179 mg/dL (ref 100–199)
HDL: 38 mg/dL — ABNORMAL LOW (ref >39)
LDL Chol Calc (NIH): 102 mg/dL — ABNORMAL HIGH (ref 0–99)
Triglycerides: 224 mg/dL — ABNORMAL HIGH (ref 0–149)
VLDL Cholesterol Cal: 39 mg/dL (ref 5–40)

## 2024-02-29 LAB — COMPREHENSIVE METABOLIC PANEL WITH GFR
ALT: 16 IU/L (ref 0–32)
AST: 20 IU/L (ref 0–40)
Albumin: 4.2 g/dL (ref 3.8–4.8)
Alkaline Phosphatase: 81 IU/L (ref 49–135)
BUN/Creatinine Ratio: 16 (ref 12–28)
BUN: 17 mg/dL (ref 8–27)
Bilirubin Total: 0.4 mg/dL (ref 0.0–1.2)
CO2: 27 mmol/L (ref 20–29)
Calcium: 9.3 mg/dL (ref 8.7–10.3)
Chloride: 101 mmol/L (ref 96–106)
Creatinine, Ser: 1.04 mg/dL — ABNORMAL HIGH (ref 0.57–1.00)
Globulin, Total: 2.4 g/dL (ref 1.5–4.5)
Glucose: 97 mg/dL (ref 70–99)
Potassium: 4.6 mmol/L (ref 3.5–5.2)
Sodium: 142 mmol/L (ref 134–144)
Total Protein: 6.6 g/dL (ref 6.0–8.5)
eGFR: 55 mL/min/1.73 — ABNORMAL LOW (ref >59)

## 2024-02-29 LAB — DIGOXIN LEVEL: Digoxin, Serum: 0.8 ng/mL (ref 0.5–0.9)

## 2024-02-29 LAB — PRO B NATRIURETIC PEPTIDE: NT-Pro BNP: 436 pg/mL (ref 0–738)

## 2024-03-02 MED ORDER — ROSUVASTATIN CALCIUM 20 MG PO TABS: 20.0000 mg | ORAL_TABLET | Freq: Every day | ORAL | 3 refills | Status: AC

## 2024-03-17 ENCOUNTER — Other Ambulatory Visit: Payer: Self-pay | Admitting: Family Medicine

## 2024-03-17 DIAGNOSIS — R590 Localized enlarged lymph nodes: Secondary | ICD-10-CM

## 2024-03-20 ENCOUNTER — Ambulatory Visit: Attending: Cardiovascular Disease | Admitting: Pharmacist

## 2024-03-20 DIAGNOSIS — Z5181 Encounter for therapeutic drug level monitoring: Secondary | ICD-10-CM | POA: Diagnosis not present

## 2024-03-20 DIAGNOSIS — Z952 Presence of prosthetic heart valve: Secondary | ICD-10-CM | POA: Diagnosis not present

## 2024-03-20 DIAGNOSIS — I480 Paroxysmal atrial fibrillation: Secondary | ICD-10-CM | POA: Diagnosis not present

## 2024-03-20 LAB — POCT INR: INR: 2.1 (ref 2.0–3.0)

## 2024-03-20 NOTE — Progress Notes (Signed)
 Description   INR-2.1; Continue taking 1 tablet daily, except 1/2 tablet on Fridays. Recheck INR in 4 weeks.   Coumadin  Clinic [phone]

## 2024-03-20 NOTE — Patient Instructions (Signed)
 Description   INR-2.1; Continue taking 1 tablet daily, except 1/2 tablet on Fridays. Recheck INR in 4 weeks.   Coumadin  Clinic [phone]

## 2024-03-25 ENCOUNTER — Ambulatory Visit: Admitting: Adult Health

## 2024-03-25 ENCOUNTER — Encounter: Payer: Self-pay | Admitting: Adult Health

## 2024-03-25 DIAGNOSIS — F331 Major depressive disorder, recurrent, moderate: Secondary | ICD-10-CM | POA: Diagnosis not present

## 2024-03-25 DIAGNOSIS — G4700 Insomnia, unspecified: Secondary | ICD-10-CM

## 2024-03-25 NOTE — Progress Notes (Signed)
 [name] [id number] [birth year] 78 y.o.  Subjective:   Patient ID:  [name] is a 78 y.o. (DOB Aug 07, 1945) female.  Chief Complaint: No chief complaint on file.   HPI [name] presents to the office today for follow-up of MDD and insomnia.  Describes mood today as ok. Pleasant. Denies tearfulness. Mood symptoms - denies depression. Reports stable interest and motivation. Denies anxiety and irritability. Denies panic attacks. Denies worry, rumination and over thinking. Reports mood is stable. Stating I think I'm doing pretty good. Feels like current medications are working well. Taking medications as prescribed.  Energy levels improved. Active, trying to exercise more. Reports she enjoys some usual interests and activities. Married. Lives with husband and 2 [place] terriers. Has a son, age 75. Has one step daughter - 49. Spending time with family. Attends church.  Appetite adequate. Reports weight loss - 172 pounds. Sleeps well most nights. Averages 10 hours. Denies daytime napping.    Focus and concentration stable. Completing tasks. Managing aspects of household. Retired. Denies SI or HI.  Denies AH or VH. Denies self harm. Denies substance use.  Previous medication trials:  Xanax , Zoloft , Wellbutrin , Valium , Ambien , Lexapro    Flowsheet Row ED to Hosp-Admission (Discharged) from 03/23/2023 in [facility] UC from 10/27/2022 in [facility]  UC from 08/13/2022 in [facility] at [facility] RISK CATEGORY No Risk No Risk No Risk     Review of Systems:  Review of Systems  Musculoskeletal:  Negative for gait problem.  Neurological:  Negative for tremors.  Psychiatric/Behavioral:         Please refer to HPI    Medications: I have reviewed the patient's current medications.  Current Outpatient Medications  Medication Sig Dispense Refill   acetaminophen  (TYLENOL ) 500 MG tablet Take 500 mg  by mouth as needed.     ARIPiprazole  (ABILIFY ) 5 MG tablet Take 1 tablet (5 mg total) by mouth daily. 90 tablet 0   buPROPion  (WELLBUTRIN  XL) 300 MG 24 hr tablet TAKE 1 TABLET BY MOUTH EVERY DAY 90 tablet 0   calcium -vitamin D  (OSCAL WITH D) 500-200 MG-UNIT per tablet Take 2 tablets by mouth in the morning.     carvedilol  (COREG ) 3.125 MG tablet TAKE 1 TABLET BY MOUTH TWICE A DAY WITH A MEAL 180 tablet 3   Cholecalciferol  (VITAMIN D ) 2000 UNITS tablet Take 4,000 Units by mouth in the morning.     desvenlafaxine  (PRISTIQ ) 100 MG 24 hr tablet Take 1 tablet (100 mg total) by mouth daily. 90 tablet 3   digoxin  (LANOXIN ) 0.125 MG tablet TAKE 1 TABLET BY MOUTH DAILY 90 tablet 3   ENTRESTO  97-103 MG TAKE 1 TABLET BY MOUTH TWICE A DAY 180 tablet 3   famotidine (PEPCID) 10 MG tablet Take 10 mg by mouth as needed for heartburn or indigestion.     ferrous sulfate  325 (65 FE) MG tablet Take 325 mg by mouth in the morning.     furosemide  (LASIX ) 80 MG tablet TAKE 1 TABLET BY MOUTH DAILY. OKAY TO TAKE EXTRA FOR WEIGHT GAIN> 3LB/ DAILY OR 5LB/WEEKLY 135 tablet 3   levothyroxine  (SYNTHROID , LEVOTHROID) 100 MCG tablet Take 100 mcg daily before breakfast by mouth.     Multiple Vitamin (MULTIVITAMIN WITH MINERALS) TABS Take 1 tablet by mouth in the morning.     oxymetazoline  (AFRIN) 0.05 % nasal spray Place 1 spray into both nostrils daily as needed for  congestion.     potassium chloride  SA (KLOR-CON  M20) 20 MEQ tablet TAKE 2 TABLETS(40 MEQ) BY MOUTH DAILY 180 tablet 3   rosuvastatin  (CRESTOR ) 20 MG tablet Take 1 tablet (20 mg total) by mouth daily. 90 tablet 3   traZODone  (DESYREL ) 50 MG tablet Take 1 tablet (50 mg total) by mouth at bedtime as needed. 90 tablet 3   tretinoin  (RETIN-A ) 0.05 % cream Apply 1 application topically at bedtime. Applied to face     warfarin (COUMADIN ) 4 MG tablet Take 1/2 to 1 tablet by mouth once daily or as directed by Coumadin  Clinic 100 tablet 0   No current facility-administered  medications for this visit.    Medication Side Effects: None  Allergies: Allergies[1]  Past Medical History:  Diagnosis Date   Anxiety    Aortic stenosis    status post aortic valve replacement with St. 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   Depression    DJD (degenerative joint disease) of knee    Dyslipidemia    GERD (gastroesophageal reflux disease)    several years ago; none since (11/12/2012)   Hyperlipidemia    Hyperlipidemia LDL goal <70 01/19/2017   Hypertension    Left atrial enlargement    Mitral regurgitation 06/25/2016   Mild moderate MR by echo 06/2016   Obesity    Persistent atrial fibrillation (HCC)    s/p afib ablation x 2 at [facility]   Sleep apnea    On CPAP at 12cm H2O   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   Please see review of systems for further details on the patient's review from today.   Objective:   Physical Exam:  There were no vitals taken for this visit.  Physical Exam Constitutional:      General: She is not in acute distress. Musculoskeletal:        General: No deformity.  Neurological:     Mental Status: She is alert and oriented to person, place, and time.     Coordination: Coordination normal.  Psychiatric:        Attention and Perception: Attention and perception normal. She does not perceive auditory or visual hallucinations.        Mood and Affect: Mood normal. Mood is not anxious or depressed. Affect is not labile, blunt, angry or inappropriate.        Speech: Speech normal.        Behavior: Behavior normal.        Thought Content: Thought  content normal. Thought content is not paranoid or delusional. Thought content does not include homicidal or suicidal ideation. Thought content does not include homicidal or suicidal plan.        Cognition and Memory: Cognition and memory normal.        Judgment: Judgment normal.     Comments: Insight intact     Lab Review:     Component Value Date/Time   NA 142 02/28/2024 1043   K 4.6 02/28/2024 1043   CL 101 02/28/2024 1043   CO2 27 02/28/2024 1043   GLUCOSE 97  02/28/2024 1043   GLUCOSE 111 (H) 03/24/2023 0807   BUN 17 02/28/2024 1043   CREATININE 1.04 (H) 02/28/2024 1043   CREATININE 0.82 12/28/2015 1000   CALCIUM  9.3 02/28/2024 1043   PROT 6.6 02/28/2024 1043   ALBUMIN 4.2 02/28/2024 1043   AST 20 02/28/2024 1043   ALT 16 02/28/2024 1043   ALKPHOS 81 02/28/2024 1043   BILITOT 0.4 02/28/2024 1043   GFRNONAA 51 (L) 03/24/2023 0807   GFRAA 82 02/05/2020 1120       Component Value Date/Time   WBC 5.5 02/28/2024 1043   WBC 9.7 03/27/2023 0626   RBC 3.99 02/28/2024 1043   RBC 4.05 03/27/2023 0626   HGB 12.5 02/28/2024 1043   HCT 37.5 02/28/2024 1043   PLT 256 02/28/2024 1043   MCV 94 02/28/2024 1043   MCH 31.3 02/28/2024 1043   MCH 30.4 03/27/2023 0626   MCHC 33.3 02/28/2024 1043   MCHC 33.2 03/27/2023 0626   RDW 12.8 02/28/2024 1043   LYMPHSABS 1.0 03/24/2023 0807   LYMPHSABS 0.9 09/28/2020 1017   MONOABS 1.4 (H) 03/24/2023 0807   EOSABS 0.6 (H) 03/24/2023 0807   EOSABS 0.2 09/28/2020 1017   BASOSABS 0.0 03/24/2023 0807   BASOSABS 0.0 09/28/2020 1017    No results found for: POCLITH, LITHIUM   No results found for: PHENYTOIN, PHENOBARB, VALPROATE, CBMZ   .res Assessment: Plan:    Plan:  PDMP reviewed  Wellbutrin  XL 300 every morning. Denies seizure history. Denies hand tremors. Trazadone 50mg  for sleep Pristiq  100mg  daily  Abilify  5mg  daily   RTC 3 months  10 minutes spent dedicated to the care of this patient on the date of this  encounter to include pre-visit review of records, ordering of medication, post visit documentation, and face-to-face time with the patient discussing depression. Discussed continuing current medication regimen.  Patient advised to contact office with any questions, adverse effects, or acute worsening in signs and symptoms.  Discussed potential metabolic side effects associated with atypical antipsychotics, as well as potential risk for movement side effects. Advised pt to contact office if movement side effects occur.    Diagnoses and all orders for this visit:  Major depressive disorder, recurrent episode, moderate (HCC)  Insomnia, unspecified type     Please see After Visit Summary for patient specific instructions.  Future Appointments  Date Time Provider Department Center  03/26/2024 11:30 AM GI-315 US  2 [id number] GI-315 W. WE  04/14/2024 11:00 AM CVD HVT COUMADIN  CLINIC 2 CVD-MAGST H&V  01/27/2025 10:30 AM HVC-ECHO 4 HVC-ECHO H&V    No orders of the defined types were placed in this encounter.   -------------------------------     [1]  Allergies Allergen Reactions   Codeine Anaphylaxis    Jerking, involuntary jerking    Dilantin [Phenytoin Sodium Extended] Rash and Itching   Metoprolol  Other (See Comments)    depression    Propoxyphene Other (See Comments)    Bad vivid dreams

## 2024-03-26 ENCOUNTER — Inpatient Hospital Stay: Admission: RE | Admit: 2024-03-26

## 2024-03-26 DIAGNOSIS — R590 Localized enlarged lymph nodes: Secondary | ICD-10-CM

## 2024-03-31 ENCOUNTER — Other Ambulatory Visit: Payer: Self-pay | Admitting: Adult Health

## 2024-03-31 DIAGNOSIS — F331 Major depressive disorder, recurrent, moderate: Secondary | ICD-10-CM

## 2024-04-03 ENCOUNTER — Other Ambulatory Visit: Payer: Self-pay | Admitting: Cardiovascular Disease

## 2024-04-03 DIAGNOSIS — I480 Paroxysmal atrial fibrillation: Secondary | ICD-10-CM

## 2024-04-14 ENCOUNTER — Ambulatory Visit: Attending: Cardiovascular Disease

## 2024-04-14 DIAGNOSIS — Z5181 Encounter for therapeutic drug level monitoring: Secondary | ICD-10-CM | POA: Diagnosis not present

## 2024-04-14 DIAGNOSIS — I480 Paroxysmal atrial fibrillation: Secondary | ICD-10-CM | POA: Diagnosis not present

## 2024-04-14 DIAGNOSIS — Z952 Presence of prosthetic heart valve: Secondary | ICD-10-CM | POA: Diagnosis not present

## 2024-04-14 LAB — POCT INR: INR: 1.9 — AB (ref 2.0–3.0)

## 2024-04-14 NOTE — Patient Instructions (Signed)
 Description   INR-1.9; Take 1.5 tablets today, then resume same dosage of Warfarin 1 tablet daily, except 1/2 tablet on Fridays. Recheck INR in 4 weeks.   Coumadin  Clinic [phone]

## 2024-04-14 NOTE — Progress Notes (Signed)
 Description   INR-1.9; Take 1.5 tablets today, then resume same dosage of Warfarin 1 tablet daily, except 1/2 tablet on Fridays. Recheck INR in 4 weeks.   Coumadin  Clinic [phone]

## 2024-05-04 ENCOUNTER — Encounter: Payer: Self-pay | Admitting: Cardiovascular Disease

## 2024-05-04 MED ORDER — FUROSEMIDE 80 MG PO TABS: ORAL_TABLET | ORAL | 2 refills | Status: AC

## 2024-05-06 ENCOUNTER — Ambulatory Visit (INDEPENDENT_AMBULATORY_CARE_PROVIDER_SITE_OTHER): Admitting: Otolaryngology

## 2024-05-06 ENCOUNTER — Encounter (INDEPENDENT_AMBULATORY_CARE_PROVIDER_SITE_OTHER): Payer: Self-pay | Admitting: Otolaryngology

## 2024-05-06 ENCOUNTER — Other Ambulatory Visit (HOSPITAL_COMMUNITY)
Admission: RE | Admit: 2024-05-06 | Discharge: 2024-05-06 | Disposition: A | Discharge status: Home / Self Care | Source: Ambulatory Visit | Attending: Otolaryngology | Admitting: Otolaryngology

## 2024-05-06 VITALS — BP 125/66 | HR 73 | Temp 97.7°F | Ht 65.0 in | Wt 174.0 lb

## 2024-05-06 DIAGNOSIS — K118 Other diseases of salivary glands: Secondary | ICD-10-CM | POA: Insufficient documentation

## 2024-05-06 NOTE — Progress Notes (Signed)
 Reason for Consult: Parotid mass Referring Physician: Dr. Athena Rudell [name] is an 79 y.o. female.  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  It has not caused her any discomfort or problems.  No numbness or tingling.  No facial nerve weakness.  She had an ultrasound which showed a 1.8 cm mass that is nonvascular and lobulated.  She has no dysphagia or dyne aphasia.  No history of any skin cancers or lesions in her scalp.  Past Medical History:  Diagnosis Date   Anxiety    Aortic stenosis    status post aortic valve replacement with St. 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   Depression    DJD (degenerative joint disease) of knee    Dyslipidemia    GERD (gastroesophageal reflux disease)    several years ago; none since (11/12/2012)   Hyperlipidemia    Hyperlipidemia LDL goal <70 01/19/2017   Hypertension    Left atrial enlargement    Mitral regurgitation 06/25/2016   Mild moderate MR by echo 06/2016   Obesity    Persistent atrial fibrillation (HCC)    s/p afib ablation x 2 at [facility]   Sleep apnea    On CPAP at 12cm H2O   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 Jude AVR   CARDIOVERSION N/A 07/22/2012   Procedure: CARDIOVERSION;  Surgeon: Oneil Parchment, MD;  Location: [facility] ENDOSCOPY;  Service: Cardiovascular;  Laterality: N/A;   CARDIOVERSION N/A 11/25/2012   Procedure: CARDIOVERSION;  Surgeon: Wilbert JONELLE Bihari, MD;  Location: [facility] ENDOSCOPY;  Service: Cardiovascular;  Laterality: N/A;   CARDIOVERSION N/A 12/30/2012   Procedure: CARDIOVERSION;   Surgeon: Wilbert JONELLE Bihari, MD;  Location: [facility] ENDOSCOPY;  Service: Cardiovascular;  Laterality: N/A;  h&p in file-HW    CARDIOVERSION N/A 03/30/2013   Procedure: CARDIOVERSION;  Surgeon: Wilbert JONELLE Bihari, MD;  Location: [facility] ENDOSCOPY;  Service: Cardiovascular;  Laterality: N/A;   ELECTROPHYSIOLOGIC STUDY  11/2013   Afib ablation x 2 (11/2013 and 10/2015) at [facility] by Dr Bonnie with recurrence post ablation   KNEE ARTHROSCOPY Left 1980's   2 (11/12/2012)   KNEE SURGERY Left 1980's   after 2 scopes they went in and did some kind of OR (11/12/2012)   RIGHT/LEFT HEART CATH AND CORONARY ANGIOGRAPHY N/A 10/14/2020   Procedure: RIGHT/LEFT HEART CATH AND CORONARY ANGIOGRAPHY;  Surgeon: Jordan, Peter M, MD;  Location: [facility] INVASIVE CV LAB;  Service: Cardiovascular;  Laterality: N/A;   TEE WITHOUT CARDIOVERSION N/A 07/22/2012   Procedure: TRANSESOPHAGEAL ECHOCARDIOGRAM (TEE);  Surgeon: Oneil Parchment, MD;  Location: [facility] ENDOSCOPY;  Service: Cardiovascular;  Laterality: N/A;  Rm 2034   TEE WITHOUT CARDIOVERSION N/A 10/07/2013   Procedure: TRANSESOPHAGEAL ECHOCARDIOGRAM (TEE);  Surgeon: Oneil Parchment, MD;  Location: [facility] ENDOSCOPY;  Service: Cardiovascular;  Laterality: N/A;   TEE WITHOUT CARDIOVERSION N/A 07/12/2020   Procedure: TRANSESOPHAGEAL ECHOCARDIOGRAM (TEE);  Surgeon: Francyne Headland, MD;  Location: [facility] ENDOSCOPY;  Service: Cardiovascular;  Laterality: N/A;   TOTAL  KNEE ARTHROPLASTY Left 11/05/2014   Procedure: TOTAL KNEE ARTHROPLASTY;  Surgeon: Norleen Gavel, MD;  Location: [facility] OR;  Service: Orthopedics;  Laterality: Left;   TUBAL LIGATION  1980's    Family History  Problem Relation Age of Onset   Lung cancer Mother    Hypertension Mother    Hyperlipidemia Father    Hypertension Father    Breast cancer Sister 51   Heart disease Brother    BRCA 1/2 Neg Hx     Social History:  reports that she has never smoked. She has never used smokeless tobacco. She reports that she does not drink alcohol and does not use  drugs.  Allergies: Allergies[1]   No results found for this or any previous visit (from the past 48 hours).  No results found.  ROS Blood pressure 125/66, pulse 73, temperature 97.7 F (36.5 C), height 5' 5 (1.651 m), weight 174 lb (78.9 kg), SpO2 96%. Physical Exam Constitutional:      Appearance: Normal appearance.  HENT:     Head: Normocephalic and atraumatic.     Right Ear: Tympanic membrane is without lesions and middle ear aerated, ear canal and external ear normal.     Left Ear: Tympanic membrane is without lesions and middle ear aerated, ear canal and external ear normal.     Nose: Nose without deviation of septum.  Turbinates with mild hypertrophy, No significant swelling or masses.     Oral cavity/oropharynx: Mucous membranes are moist. No lesions or masses    Larynx: normal voice. Mirror attempted without success    Eyes:     Extraocular Movements: Extraocular movements intact.     Conjunctiva/sclera: Conjunctivae normal.     Pupils: Pupils are equal, round, and reactive to light.  Cardiovascular:     Rate and Rhythm: Normal rate.  Pulmonary:     Effort: Pulmonary effort is normal.  Musculoskeletal:     Cervical back: Normal range of motion and neck supple. No rigidity.  Lymphadenopathy:     Cervical: No cervical adenopathy or masses.salivary glands without lesions. .     Salivary glands-there is a 2 cm mass in the left parotid gland that is mobile and feels like it is in the tail of the parotid gland.  This is nontender.  o mass or swelling Neurological:     Mental Status: He is alert. CN 2-12 intact. No nystagmus   Procedure for FNA  The patient was informed the risk and benefits of the procedure and options were discussed.  All questions were answered and consent was obtained.  Patient was prepped of the skin.  A 22-gauge needle was used to pass twice into the mass in the left parotid gland.  The specimen was applied to slides and then placed into the Cyto  solution.  There was good hemostasis.  The patient tolerated well.  The specimen was sent for pathology.    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  I discussed with her about parotidectomy versus observation and she preferred a biopsy.  We we will discuss the options further once the pathology is back which she will call within a week to get the results.  Norleen Notice 05/06/2024, 9:09 AM         [1]  Allergies Allergen Reactions   Codeine Anaphylaxis    Jerking, involuntary jerking    Dilantin [Phenytoin Sodium Extended] Rash and Itching  Metoprolol  Other (See Comments)    depression    Propoxyphene Other (See Comments)    Bad vivid dreams

## 2024-05-08 LAB — CYTOLOGY - NON PAP

## 2024-05-12 ENCOUNTER — Telehealth (INDEPENDENT_AMBULATORY_CARE_PROVIDER_SITE_OTHER): Payer: Self-pay | Admitting: Otolaryngology

## 2024-05-12 ENCOUNTER — Other Ambulatory Visit (INDEPENDENT_AMBULATORY_CARE_PROVIDER_SITE_OTHER): Payer: Self-pay | Admitting: Otolaryngology

## 2024-05-12 ENCOUNTER — Telehealth (INDEPENDENT_AMBULATORY_CARE_PROVIDER_SITE_OTHER): Payer: Self-pay

## 2024-05-12 ENCOUNTER — Ambulatory Visit

## 2024-05-12 DIAGNOSIS — K118 Other diseases of salivary glands: Secondary | ICD-10-CM

## 2024-05-12 NOTE — Telephone Encounter (Signed)
 Called patient and left a voicemail for them to give me a call back.

## 2024-05-12 NOTE — Telephone Encounter (Signed)
 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  After discussing options of observation, ultrasound-guided, or parotidectomy she wants to proceed with an ultrasound-guided biopsy.  That will be ordered and she will follow-up after that study to make a new plan going forward.

## 2024-05-13 ENCOUNTER — Encounter (HOSPITAL_COMMUNITY): Payer: Self-pay

## 2024-05-13 NOTE — Telephone Encounter (Signed)
 Called patient no answer so I left a voicemail for the patient to give me a call back regarding some instructions from Dr.Byers.

## 2024-05-13 NOTE — Progress Notes (Signed)
 Karalee Wilkie POUR, MD  Nadra Hritz No, not for FNA  Thanks,  HKM       Previous Messages    ----- Message ----- From: Jadzia Ibsen Sent: 05/13/2024  12:08 PM EST To: Wilkie POUR Karalee, MD Subject: RE: US  FINE NEEDLE ASP 1ST LESION              Hold for coumadin  ? ----- Message ----- From: Karalee Wilkie POUR, MD Sent: 05/13/2024  11:52 AM EST To: Rumeal Cullipher Subject: RE: US  FINE NEEDLE ASP 1ST LESION              Approved for US  FNA BX of LEFT PAROTID NODULE  No sedation  HKM ----- Message ----- From: Disa Riedlinger Sent: 05/12/2024   3:57 PM EST To: Tresean Mattix; Ir Procedure Requests Subject: US  FINE NEEDLE ASP 1ST LESION                  Procedure: US  FINE NEEDLE ASP 1ST LESION  Reason: left parotid mass Dx: Parotid mass [K11.8 (ICD-10-CM)]    History : US  soft tissue head and neck  Provider : Roark Rush, MD  Contact : [phone]   On coumadin  - will she need to hold for procedure ?

## 2024-05-15 ENCOUNTER — Other Ambulatory Visit: Payer: Self-pay | Admitting: Family Medicine

## 2024-05-15 DIAGNOSIS — Z1231 Encounter for screening mammogram for malignant neoplasm of breast: Secondary | ICD-10-CM

## 2024-05-18 ENCOUNTER — Ambulatory Visit

## 2024-05-27 ENCOUNTER — Ambulatory Visit (HOSPITAL_COMMUNITY)

## 2024-06-02 ENCOUNTER — Ambulatory Visit

## 2024-06-23 ENCOUNTER — Ambulatory Visit: Admitting: Adult Health

## 2025-01-27 ENCOUNTER — Ambulatory Visit (HOSPITAL_COMMUNITY)
